# Patient Record
Sex: Male | Born: 1935
Health system: Southern US, Community
[De-identification: ages and names within clinical notes are randomized; demographics above are authoritative.]

## PROBLEM LIST (undated history)

## (undated) DIAGNOSIS — M1990 Unspecified osteoarthritis, unspecified site: Secondary | ICD-10-CM

## (undated) DIAGNOSIS — I10 Essential (primary) hypertension: Secondary | ICD-10-CM

## (undated) DIAGNOSIS — D689 Coagulation defect, unspecified: Secondary | ICD-10-CM

## (undated) DIAGNOSIS — Z8673 Personal history of transient ischemic attack (TIA), and cerebral infarction without residual deficits: Secondary | ICD-10-CM

## (undated) DIAGNOSIS — R569 Unspecified convulsions: Secondary | ICD-10-CM

## (undated) DIAGNOSIS — E46 Unspecified protein-calorie malnutrition: Secondary | ICD-10-CM

## (undated) DIAGNOSIS — G8929 Other chronic pain: Secondary | ICD-10-CM

## (undated) DIAGNOSIS — I480 Paroxysmal atrial fibrillation: Secondary | ICD-10-CM

## (undated) DIAGNOSIS — K7010 Alcoholic hepatitis without ascites: Secondary | ICD-10-CM

## (undated) DIAGNOSIS — I469 Cardiac arrest, cause unspecified: Secondary | ICD-10-CM

## (undated) DIAGNOSIS — F1010 Alcohol abuse, uncomplicated: Secondary | ICD-10-CM

## (undated) DIAGNOSIS — Z8701 Personal history of pneumonia (recurrent): Secondary | ICD-10-CM

## (undated) DIAGNOSIS — R443 Hallucinations, unspecified: Secondary | ICD-10-CM

## (undated) DIAGNOSIS — I4892 Unspecified atrial flutter: Secondary | ICD-10-CM

## (undated) DIAGNOSIS — I2510 Atherosclerotic heart disease of native coronary artery without angina pectoris: Secondary | ICD-10-CM

## (undated) DIAGNOSIS — E785 Hyperlipidemia, unspecified: Secondary | ICD-10-CM

## (undated) DIAGNOSIS — E119 Type 2 diabetes mellitus without complications: Secondary | ICD-10-CM

## (undated) DIAGNOSIS — R4182 Altered mental status, unspecified: Secondary | ICD-10-CM

## (undated) DIAGNOSIS — M545 Low back pain, unspecified: Secondary | ICD-10-CM

## (undated) DIAGNOSIS — D638 Anemia in other chronic diseases classified elsewhere: Secondary | ICD-10-CM

## (undated) HISTORY — PX: CORONARY ARTERY BYPASS GRAFT: SHX141

## (undated) HISTORY — DX: Low back pain: M54.5

## (undated) HISTORY — DX: Anemia in other chronic diseases classified elsewhere: D63.8

## (undated) HISTORY — DX: Alcoholic hepatitis without ascites: K70.10

## (undated) HISTORY — DX: Low back pain, unspecified: M54.50

## (undated) HISTORY — DX: Alcohol abuse, uncomplicated: F10.10

## (undated) HISTORY — DX: Hyperlipidemia, unspecified: E78.5

## (undated) HISTORY — DX: Other chronic pain: G89.29

## (undated) HISTORY — PX: CATARACT EXTRACTION W/ INTRAOCULAR LENS  IMPLANT, BILATERAL: SHX1307

---

## 1996-08-02 ENCOUNTER — Encounter: Payer: Self-pay | Admitting: Internal Medicine

## 1996-09-09 ENCOUNTER — Encounter: Payer: Self-pay | Admitting: Internal Medicine

## 1996-11-16 ENCOUNTER — Encounter: Payer: Self-pay | Admitting: Internal Medicine

## 1996-11-23 ENCOUNTER — Encounter: Payer: Self-pay | Admitting: Internal Medicine

## 1997-09-11 ENCOUNTER — Emergency Department (HOSPITAL_COMMUNITY): Admission: EM | Admit: 1997-09-11 | Discharge: 1997-09-11 | Payer: Self-pay | Admitting: Emergency Medicine

## 1999-03-08 ENCOUNTER — Encounter: Payer: Self-pay | Admitting: Specialist

## 1999-03-08 ENCOUNTER — Ambulatory Visit (HOSPITAL_COMMUNITY): Admission: RE | Admit: 1999-03-08 | Discharge: 1999-03-08 | Payer: Self-pay | Admitting: Specialist

## 1999-03-25 ENCOUNTER — Encounter: Payer: Self-pay | Admitting: Specialist

## 1999-03-25 ENCOUNTER — Ambulatory Visit (HOSPITAL_COMMUNITY): Admission: RE | Admit: 1999-03-25 | Discharge: 1999-03-25 | Payer: Self-pay | Admitting: Specialist

## 2000-02-26 ENCOUNTER — Emergency Department (HOSPITAL_COMMUNITY): Admission: EM | Admit: 2000-02-26 | Discharge: 2000-02-26 | Payer: Self-pay | Admitting: Emergency Medicine

## 2000-06-08 ENCOUNTER — Emergency Department (HOSPITAL_COMMUNITY): Admission: EM | Admit: 2000-06-08 | Discharge: 2000-06-09 | Payer: Self-pay | Admitting: Emergency Medicine

## 2000-08-11 ENCOUNTER — Inpatient Hospital Stay (HOSPITAL_COMMUNITY): Admission: EM | Admit: 2000-08-11 | Discharge: 2000-08-19 | Payer: Self-pay | Admitting: Emergency Medicine

## 2000-08-11 ENCOUNTER — Encounter: Payer: Self-pay | Admitting: Emergency Medicine

## 2000-08-14 ENCOUNTER — Encounter: Payer: Self-pay | Admitting: Orthopedic Surgery

## 2000-10-15 ENCOUNTER — Emergency Department (HOSPITAL_COMMUNITY): Admission: EM | Admit: 2000-10-15 | Discharge: 2000-10-15 | Payer: Self-pay | Admitting: Emergency Medicine

## 2001-04-13 ENCOUNTER — Encounter: Payer: Self-pay | Admitting: Emergency Medicine

## 2001-04-13 ENCOUNTER — Emergency Department (HOSPITAL_COMMUNITY): Admission: EM | Admit: 2001-04-13 | Discharge: 2001-04-13 | Payer: Self-pay | Admitting: Emergency Medicine

## 2001-06-05 ENCOUNTER — Inpatient Hospital Stay (HOSPITAL_COMMUNITY): Admission: EM | Admit: 2001-06-05 | Discharge: 2001-06-08 | Payer: Self-pay | Admitting: Emergency Medicine

## 2001-06-26 ENCOUNTER — Encounter: Payer: Self-pay | Admitting: Emergency Medicine

## 2001-06-26 ENCOUNTER — Emergency Department (HOSPITAL_COMMUNITY): Admission: EM | Admit: 2001-06-26 | Discharge: 2001-06-26 | Payer: Self-pay | Admitting: Emergency Medicine

## 2003-12-04 ENCOUNTER — Emergency Department (HOSPITAL_COMMUNITY): Admission: EM | Admit: 2003-12-04 | Discharge: 2003-12-05 | Payer: Self-pay | Admitting: Emergency Medicine

## 2004-03-20 ENCOUNTER — Ambulatory Visit: Payer: Self-pay | Admitting: Internal Medicine

## 2004-07-16 ENCOUNTER — Ambulatory Visit: Payer: Self-pay | Admitting: Internal Medicine

## 2004-12-24 ENCOUNTER — Ambulatory Visit: Payer: Self-pay | Admitting: Internal Medicine

## 2005-11-06 ENCOUNTER — Ambulatory Visit: Payer: Self-pay | Admitting: Internal Medicine

## 2006-04-23 ENCOUNTER — Ambulatory Visit: Payer: Self-pay | Admitting: Internal Medicine

## 2006-12-11 DIAGNOSIS — I10 Essential (primary) hypertension: Secondary | ICD-10-CM

## 2006-12-11 DIAGNOSIS — I2510 Atherosclerotic heart disease of native coronary artery without angina pectoris: Secondary | ICD-10-CM | POA: Insufficient documentation

## 2006-12-11 DIAGNOSIS — E1151 Type 2 diabetes mellitus with diabetic peripheral angiopathy without gangrene: Secondary | ICD-10-CM

## 2007-08-12 ENCOUNTER — Ambulatory Visit: Payer: Self-pay | Admitting: Internal Medicine

## 2007-08-12 DIAGNOSIS — I480 Paroxysmal atrial fibrillation: Secondary | ICD-10-CM

## 2007-08-16 ENCOUNTER — Ambulatory Visit: Payer: Self-pay | Admitting: Internal Medicine

## 2007-08-16 DIAGNOSIS — D638 Anemia in other chronic diseases classified elsewhere: Secondary | ICD-10-CM

## 2007-08-16 HISTORY — DX: Anemia in other chronic diseases classified elsewhere: D63.8

## 2007-08-16 LAB — CONVERTED CEMR LAB
Ferritin: 108.1 ng/mL (ref 22.0–322.0)
Folate: 12.7 ng/mL

## 2007-08-23 ENCOUNTER — Ambulatory Visit: Payer: Self-pay | Admitting: Internal Medicine

## 2007-08-23 LAB — CONVERTED CEMR LAB
OCCULT 1: NEGATIVE
OCCULT 3: POSITIVE

## 2007-09-01 ENCOUNTER — Encounter: Payer: Self-pay | Admitting: Internal Medicine

## 2007-09-01 ENCOUNTER — Ambulatory Visit: Payer: Self-pay

## 2007-09-14 ENCOUNTER — Ambulatory Visit: Payer: Self-pay | Admitting: Internal Medicine

## 2007-09-14 LAB — CONVERTED CEMR LAB
Basophils Absolute: 0 10*3/uL (ref 0.0–0.1)
Blood Glucose, Fingerstick: 111
Eosinophils Absolute: 0.1 10*3/uL (ref 0.0–0.7)
HCT: 31.5 % — ABNORMAL LOW (ref 39.0–52.0)
Hemoglobin: 10.4 g/dL — ABNORMAL LOW (ref 13.0–17.0)
INR: 4.9
Lymphocytes Relative: 33.3 % (ref 12.0–46.0)
Monocytes Absolute: 0.8 10*3/uL (ref 0.1–1.0)
Monocytes Relative: 11.1 % (ref 3.0–12.0)
Neutrophils Relative %: 53 % (ref 43.0–77.0)
Prothrombin Time: 27 s
RBC: 3.2 M/uL — ABNORMAL LOW (ref 4.22–5.81)

## 2007-10-05 ENCOUNTER — Ambulatory Visit: Payer: Self-pay | Admitting: Internal Medicine

## 2007-10-06 LAB — CONVERTED CEMR LAB: Prothrombin Time: 30.3 s

## 2007-11-13 ENCOUNTER — Ambulatory Visit: Payer: Self-pay | Admitting: Internal Medicine

## 2007-11-13 ENCOUNTER — Inpatient Hospital Stay (HOSPITAL_COMMUNITY): Admission: EM | Admit: 2007-11-13 | Discharge: 2007-11-19 | Payer: Self-pay | Admitting: Emergency Medicine

## 2007-11-13 ENCOUNTER — Ambulatory Visit: Payer: Self-pay | Admitting: Cardiology

## 2007-11-16 ENCOUNTER — Encounter: Payer: Self-pay | Admitting: Internal Medicine

## 2007-11-23 ENCOUNTER — Ambulatory Visit: Payer: Self-pay | Admitting: Internal Medicine

## 2007-11-23 ENCOUNTER — Ambulatory Visit: Payer: Self-pay | Admitting: Cardiology

## 2007-11-23 LAB — CONVERTED CEMR LAB
BUN: 25 mg/dL — ABNORMAL HIGH (ref 6–23)
Calcium: 8.6 mg/dL (ref 8.4–10.5)
Creatinine, Ser: 1.5 mg/dL (ref 0.4–1.5)
GFR calc Af Amer: 59 mL/min
GFR calc non Af Amer: 49 mL/min
Glucose, Bld: 71 mg/dL (ref 70–99)
Potassium: 5.2 meq/L — ABNORMAL HIGH (ref 3.5–5.1)

## 2007-11-25 ENCOUNTER — Ambulatory Visit: Payer: Self-pay | Admitting: Internal Medicine

## 2007-12-03 ENCOUNTER — Ambulatory Visit: Payer: Self-pay | Admitting: Cardiovascular Disease

## 2007-12-09 ENCOUNTER — Ambulatory Visit: Payer: Self-pay | Admitting: Internal Medicine

## 2007-12-09 ENCOUNTER — Telehealth: Payer: Self-pay | Admitting: Internal Medicine

## 2007-12-14 ENCOUNTER — Ambulatory Visit: Payer: Self-pay | Admitting: Internal Medicine

## 2007-12-23 ENCOUNTER — Ambulatory Visit: Payer: Self-pay | Admitting: Internal Medicine

## 2007-12-29 ENCOUNTER — Ambulatory Visit: Payer: Self-pay | Admitting: Internal Medicine

## 2008-01-02 ENCOUNTER — Emergency Department (HOSPITAL_COMMUNITY): Admission: EM | Admit: 2008-01-02 | Discharge: 2008-01-02 | Payer: Self-pay | Admitting: Emergency Medicine

## 2008-01-03 ENCOUNTER — Telehealth (INDEPENDENT_AMBULATORY_CARE_PROVIDER_SITE_OTHER): Payer: Self-pay

## 2008-01-13 ENCOUNTER — Ambulatory Visit: Payer: Self-pay | Admitting: Cardiology

## 2008-01-27 ENCOUNTER — Ambulatory Visit: Payer: Self-pay | Admitting: Cardiology

## 2008-03-14 ENCOUNTER — Ambulatory Visit: Payer: Self-pay | Admitting: Internal Medicine

## 2008-03-14 LAB — CONVERTED CEMR LAB: Hgb A1c MFr Bld: 5.3 % (ref 4.6–6.0)

## 2008-03-31 ENCOUNTER — Ambulatory Visit: Payer: Self-pay | Admitting: Internal Medicine

## 2008-05-04 ENCOUNTER — Telehealth: Payer: Self-pay | Admitting: Internal Medicine

## 2008-07-13 ENCOUNTER — Ambulatory Visit: Payer: Self-pay | Admitting: Internal Medicine

## 2008-07-14 ENCOUNTER — Telehealth: Payer: Self-pay | Admitting: Internal Medicine

## 2008-07-14 LAB — CONVERTED CEMR LAB: Hgb A1c MFr Bld: 8.9 % — ABNORMAL HIGH (ref 4.6–6.5)

## 2008-08-17 ENCOUNTER — Emergency Department (HOSPITAL_COMMUNITY): Admission: EM | Admit: 2008-08-17 | Discharge: 2008-08-17 | Payer: Self-pay | Admitting: Family Medicine

## 2008-08-20 ENCOUNTER — Emergency Department (HOSPITAL_COMMUNITY): Admission: EM | Admit: 2008-08-20 | Discharge: 2008-08-20 | Payer: Self-pay | Admitting: Emergency Medicine

## 2008-09-25 ENCOUNTER — Ambulatory Visit: Payer: Self-pay | Admitting: Internal Medicine

## 2008-10-16 ENCOUNTER — Telehealth: Payer: Self-pay | Admitting: Internal Medicine

## 2008-10-24 ENCOUNTER — Ambulatory Visit: Payer: Self-pay | Admitting: Internal Medicine

## 2008-11-07 ENCOUNTER — Ambulatory Visit: Payer: Self-pay | Admitting: Internal Medicine

## 2008-11-21 ENCOUNTER — Ambulatory Visit: Payer: Self-pay | Admitting: Internal Medicine

## 2008-11-21 LAB — CONVERTED CEMR LAB: INR: 1.4

## 2008-12-01 ENCOUNTER — Encounter: Payer: Self-pay | Admitting: Cardiology

## 2008-12-05 ENCOUNTER — Encounter (INDEPENDENT_AMBULATORY_CARE_PROVIDER_SITE_OTHER): Payer: Self-pay | Admitting: *Deleted

## 2008-12-19 ENCOUNTER — Ambulatory Visit: Payer: Self-pay | Admitting: Internal Medicine

## 2008-12-19 LAB — CONVERTED CEMR LAB: Prothrombin Time: 13.2 s

## 2009-01-24 ENCOUNTER — Encounter (INDEPENDENT_AMBULATORY_CARE_PROVIDER_SITE_OTHER): Payer: Self-pay | Admitting: *Deleted

## 2009-02-26 ENCOUNTER — Encounter (INDEPENDENT_AMBULATORY_CARE_PROVIDER_SITE_OTHER): Payer: Self-pay | Admitting: *Deleted

## 2009-06-18 ENCOUNTER — Encounter: Payer: Self-pay | Admitting: Internal Medicine

## 2009-06-29 ENCOUNTER — Encounter: Payer: Self-pay | Admitting: Internal Medicine

## 2009-09-11 ENCOUNTER — Ambulatory Visit: Payer: Self-pay | Admitting: Internal Medicine

## 2009-09-11 ENCOUNTER — Inpatient Hospital Stay (HOSPITAL_COMMUNITY): Admission: EM | Admit: 2009-09-11 | Discharge: 2009-09-20 | Payer: Self-pay | Admitting: Emergency Medicine

## 2009-09-12 ENCOUNTER — Encounter (INDEPENDENT_AMBULATORY_CARE_PROVIDER_SITE_OTHER): Payer: Self-pay | Admitting: Internal Medicine

## 2009-09-19 ENCOUNTER — Telehealth: Payer: Self-pay | Admitting: Internal Medicine

## 2009-09-23 ENCOUNTER — Inpatient Hospital Stay (HOSPITAL_COMMUNITY)
Admission: EM | Admit: 2009-09-23 | Discharge: 2009-09-28 | Payer: Self-pay | Source: Home / Self Care | Admitting: Emergency Medicine

## 2009-09-24 ENCOUNTER — Encounter: Payer: Self-pay | Admitting: Internal Medicine

## 2009-09-24 ENCOUNTER — Ambulatory Visit: Payer: Self-pay | Admitting: Psychiatry

## 2009-09-25 ENCOUNTER — Telehealth: Payer: Self-pay | Admitting: Internal Medicine

## 2009-09-29 ENCOUNTER — Telehealth: Payer: Self-pay | Admitting: Family Medicine

## 2009-10-02 ENCOUNTER — Telehealth: Payer: Self-pay | Admitting: Internal Medicine

## 2009-10-04 ENCOUNTER — Telehealth: Payer: Self-pay | Admitting: Internal Medicine

## 2009-10-05 ENCOUNTER — Telehealth: Payer: Self-pay | Admitting: Internal Medicine

## 2009-10-09 ENCOUNTER — Ambulatory Visit: Payer: Self-pay | Admitting: Internal Medicine

## 2009-10-09 LAB — CONVERTED CEMR LAB
AST: 20 units/L (ref 0–37)
Albumin: 3.9 g/dL (ref 3.5–5.2)
Alkaline Phosphatase: 104 units/L (ref 39–117)
Basophils Absolute: 0 10*3/uL (ref 0.0–0.1)
Bilirubin, Direct: 0.1 mg/dL (ref 0.0–0.3)
Blood Glucose, Fingerstick: 86
Calcium: 9.5 mg/dL (ref 8.4–10.5)
GFR calc non Af Amer: 61.37 mL/min (ref >60)
Glucose, Bld: 81 mg/dL (ref 70–99)
Hemoglobin: 10.9 g/dL — ABNORMAL LOW (ref 13.0–17.0)
Lymphocytes Relative: 41.9 % (ref 12.0–46.0)
Monocytes Relative: 10.6 % (ref 3.0–12.0)
Neutro Abs: 4.1 10*3/uL (ref 1.4–7.7)
RBC: 3.38 M/uL — ABNORMAL LOW (ref 4.22–5.81)
RDW: 16 % — ABNORMAL HIGH (ref 11.5–14.6)
Sodium: 145 meq/L (ref 135–145)

## 2009-10-10 ENCOUNTER — Telehealth: Payer: Self-pay | Admitting: Internal Medicine

## 2009-10-11 ENCOUNTER — Telehealth: Payer: Self-pay | Admitting: Internal Medicine

## 2009-10-18 ENCOUNTER — Telehealth: Payer: Self-pay | Admitting: Internal Medicine

## 2009-10-23 ENCOUNTER — Ambulatory Visit: Payer: Self-pay | Admitting: Internal Medicine

## 2010-03-04 ENCOUNTER — Telehealth: Payer: Self-pay | Admitting: Internal Medicine

## 2010-03-08 ENCOUNTER — Telehealth: Payer: Self-pay | Admitting: Internal Medicine

## 2010-04-08 ENCOUNTER — Encounter: Payer: Self-pay | Admitting: Internal Medicine

## 2010-04-10 ENCOUNTER — Inpatient Hospital Stay (HOSPITAL_COMMUNITY)
Admission: EM | Admit: 2010-04-10 | Discharge: 2010-04-18 | Payer: Self-pay | Source: Home / Self Care | Attending: Internal Medicine | Admitting: Internal Medicine

## 2010-05-16 ENCOUNTER — Encounter: Payer: Self-pay | Admitting: Internal Medicine

## 2010-05-19 LAB — CONVERTED CEMR LAB
ALT: 13 units/L (ref 0–40)
ALT: 22 units/L (ref 0–53)
AST: 24 units/L (ref 0–37)
Albumin: 3.6 g/dL (ref 3.5–5.2)
Albumin: 3.7 g/dL (ref 3.5–5.2)
Alkaline Phosphatase: 119 units/L — ABNORMAL HIGH (ref 39–117)
Alkaline Phosphatase: 78 units/L (ref 39–117)
BUN: 10 mg/dL (ref 6–23)
BUN: 25 mg/dL — ABNORMAL HIGH (ref 6–23)
Basophils Relative: 0 % (ref 0.0–1.0)
Bilirubin, Direct: 0.1 mg/dL (ref 0.0–0.3)
CO2: 29 meq/L (ref 19–32)
Calcium: 8.6 mg/dL (ref 8.4–10.5)
Chol/HDL Ratio, serum: 2.9
Cholesterol: 117 mg/dL (ref 0–200)
Creatinine, Ser: 1 mg/dL (ref 0.4–1.5)
Eosinophil percent: 2.4 % (ref 0.0–5.0)
Eosinophils Relative: 2.4 % (ref 0.0–5.0)
GFR calc Af Amer: 77 mL/min
GFR calc non Af Amer: 64 mL/min
Glomerular Filtration Rate, Af Am: 95 mL/min/{1.73_m2}
Hgb A1c MFr Bld: 6.5 % — ABNORMAL HIGH (ref 4.6–6.0)
LDL Cholesterol: 70 mg/dL (ref 0–99)
Lymphocytes Relative: 35.5 % (ref 12.0–46.0)
MCHC: 35.4 g/dL (ref 30.0–36.0)
MCV: 94.1 fL (ref 78.0–100.0)
MCV: 98.5 fL (ref 78.0–100.0)
Monocytes Absolute: 0.7 10*3/uL (ref 0.2–0.7)
Neutro Abs: 3.5 10*3/uL (ref 1.4–7.7)
Neutrophils Relative %: 47.6 % (ref 43.0–77.0)
PSA: 3.81 ng/mL (ref 0.10–4.00)
PSA: 3.87 ng/mL (ref 0.10–4.00)
Platelets: 172 10*3/uL (ref 150–400)
Potassium: 3.7 meq/L (ref 3.5–5.1)
Potassium: 4.1 meq/L (ref 3.5–5.1)
RBC: 3.17 M/uL — ABNORMAL LOW (ref 4.22–5.81)
Sodium: 140 meq/L (ref 135–145)
Sodium: 141 meq/L (ref 135–145)
Total Bilirubin: 0.6 mg/dL (ref 0.3–1.2)
Total Protein: 9.5 g/dL — ABNORMAL HIGH (ref 6.0–8.3)
Triglyceride fasting, serum: 38 mg/dL (ref 0–149)
VLDL: 8 mg/dL (ref 0–40)
WBC: 6.3 10*3/uL (ref 4.5–10.5)

## 2010-05-20 ENCOUNTER — Ambulatory Visit
Admission: RE | Admit: 2010-05-20 | Discharge: 2010-05-20 | Payer: Self-pay | Source: Home / Self Care | Attending: Internal Medicine | Admitting: Internal Medicine

## 2010-05-20 ENCOUNTER — Other Ambulatory Visit: Payer: Self-pay | Admitting: Internal Medicine

## 2010-05-20 ENCOUNTER — Encounter: Payer: Self-pay | Admitting: Internal Medicine

## 2010-05-20 DIAGNOSIS — K7010 Alcoholic hepatitis without ascites: Secondary | ICD-10-CM | POA: Insufficient documentation

## 2010-05-20 HISTORY — DX: Alcoholic hepatitis without ascites: K70.10

## 2010-05-20 LAB — CBC WITH DIFFERENTIAL/PLATELET
Basophils Absolute: 0 10*3/uL (ref 0.0–0.1)
Eosinophils Absolute: 0.1 10*3/uL (ref 0.0–0.7)
HCT: 30.1 % — ABNORMAL LOW (ref 39.0–52.0)
Lymphs Abs: 2.8 10*3/uL (ref 0.7–4.0)
MCV: 100 fl (ref 78.0–100.0)
Monocytes Absolute: 0.7 10*3/uL (ref 0.1–1.0)
Neutrophils Relative %: 42.8 % — ABNORMAL LOW (ref 43.0–77.0)
Platelets: 155 10*3/uL (ref 150.0–400.0)
RDW: 17.3 % — ABNORMAL HIGH (ref 11.5–14.6)

## 2010-05-20 LAB — HEPATIC FUNCTION PANEL
Bilirubin, Direct: 0.1 mg/dL (ref 0.0–0.3)
Total Bilirubin: 0.6 mg/dL (ref 0.3–1.2)

## 2010-05-20 LAB — CONVERTED CEMR LAB: Alcohol, Ethyl (B): <10 mg/dL (ref 0–10)

## 2010-05-21 ENCOUNTER — Encounter: Payer: Self-pay | Admitting: Internal Medicine

## 2010-05-23 NOTE — Progress Notes (Signed)
  Phone Note Other Incoming   Summary of Call: call from [facility] -(per nurse line) -- with FYI wanted to let Dr Amador Cunas know that pt is refusing all home care  he is home from hospital and is alcoholic/ may be drinking again  family stated he did not want any home care and would not allow [place] worker past the front door (or open the door)  he is scheduled to have psychology eval on monday- will likely refuse that as well  Initial call taken by: Judith Part MD,  September 29, 2009 4:04 PM  Follow-up for Phone Call        I noted their report and will send this phone note to his primary physician  Genevieve Norlander will continue to update him Follow-up by: Judith Part MD,  September 29, 2009 4:05 PM

## 2010-05-23 NOTE — Progress Notes (Signed)
Summary: missed appt - transportation problems  Phone Note Outgoing Call Message from:  Patient  Call placed by: Duard Brady LPN,  March 04, 2010 4:57 PM Call placed to: Patient Summary of Call: missed rov - r/s to later in week - transportation problems. KIK Initial call taken by: Duard Brady LPN,  March 04, 2010 4:57 PM  Follow-up for Phone Call        noted Follow-up by: Gordy Savers  MD,  March 04, 2010 5:22 PM

## 2010-05-23 NOTE — Progress Notes (Signed)
Summary: PT/INR order for [facility] to draw  Phone Note From Other Clinic   Caller: Elnita Maxwell Ward - [facility] Summary of Call: calling wanting to know what dose coumadin - and when next PT/INR  due.  [phone]  ext [phone] Initial call taken by: Duard Brady LPN,  October 10, 2009 1:38 PM  Follow-up for Phone Call        attempt to call - ans mach - left message r/t coumadin 5 mg once daily at this time , PT/INR was just drawn in office yesterday - will be repeated in 2 wks - will forward msg to Dr. Amador Cunas to ok order to have drawn in home, but it will be next week before I call back due to he is out of office until Monday. KIK Follow-up by: Duard Brady LPN,  October 10, 2009 1:42 PM  Additional Follow-up for Phone Call Additional follow up Details #1::        OK to draw at home Additional Follow-up by: Gordy Savers  MD,  October 14, 2009 8:13 PM

## 2010-05-23 NOTE — Progress Notes (Signed)
Summary: advise about coumadin / lab to be done?  Phone Note From Other Clinic   Caller: TERRI - [facility] Reason for Call: Medication Check Summary of Call: PT WAS DISCHARGED FROM HOSP. [facility] 8PM . ON NO COUMADIN AT THIS TIME - NEED TO RESTART? WAS TREATED FRO UTI WHILE THERE , LAST DAY OF CIPRO . ALSO WAS PLACED ON CLONIDINE 0.3 MG three times a day , PLEASE ADVISE ABOUT COUMADIN.  CELL# [phone] Initial call taken by: Duard Brady LPN,  October 02, 2009 9:45 AM  Follow-up for Phone Call        after reviewing discharge note - should still be on coumadin daily - will ask Dr. Amador Cunas to advise since per pt - no coumadin since Friday. Terri advised. KIK Follow-up by: Duard Brady LPN,  October 02, 2009 9:50 AM  Additional Follow-up for Phone Call Additional follow up Details #1::        okay to resume Coumadin at prior dose.  please schedule office visit this week Additional Follow-up by: Gordy Savers  MD,  October 02, 2009 12:58 PM    Additional Follow-up for Phone Call Additional follow up Details #2::    spoke with terri - [facility] - resume coumadin5 mg once daily , must make appt to see Dr. Amador Cunas next week. KIK Follow-up by: Duard Brady LPN,  October 02, 2009 1:43 PM

## 2010-05-23 NOTE — Miscellaneous (Signed)
Summary: PT, INR Results/[facility]  PT, INR Results/[facility]   Imported By: Maryln Gottron 09/28/2009 13:21:37  _____________________________________________________________________  External Attachment:    Type:   Image     Comment:   External Document

## 2010-05-23 NOTE — Progress Notes (Signed)
Summary: missed rov   Phone Note Outgoing Call   Call placed by: Duard Brady LPN,  March 08, 2010 11:45 AM Call placed to: Patient Summary of Call: missed rov - pt sleeping - per person ans phone at home - no transportation. left instructions to call ans r/s when he does have transportation. KIk Initial call taken by: Duard Brady LPN,  March 08, 2010 11:46 AM  Follow-up for Phone Call        charge NS fee Follow-up by: Gordy Savers  MD,  March 08, 2010 11:54 AM

## 2010-05-23 NOTE — Assessment & Plan Note (Signed)
Summary: POST HOSP F/U (UTI, DEHYDRATION) // RS   Vital Signs:  Patient profile:   76 year old male Weight:      181 pounds Temp:     98.2 degrees F oral BP sitting:   130 / 82  (left arm) Cuff size:   regular  Vitals Entered By: Duard Brady LPN (October 09, 2009 11:18 AM) CC: post hospital  CBG Result 8   CC:  post hospital .  History of Present Illness: is a 76 year old patient who is seen today following a hospital discharge.  He was admitted with acute alcoholic intoxication and underwent an  alcohol withdrawal syndrome.  since his discharge he states that he has been abstinent.  He has chronic atrial for ablation on chronic Coumadin [facility] was complicated by dehydration and acute renal failure.  He has type 2 diabetes dyslipidemia and hypertension.  Since his discharge he is quite well.  He denies any weakness or orthostatic symptoms.  His medical regimen was discussed at [facility] discharge summary and records reviewed.  Allergies (verified): No Known Drug Allergies  Past History:  Past Medical History: Coronary artery disease Hypertension Diabetes mellitus, type II Dyslipidemia atrial fibrillation April 2009 anemia chronic low back pain alcohol abuse, history of alcohol withdrawal syndrome  Past Surgical History: Reviewed history from 12/11/2006 and no changes required. Coronary artery bypass graft-1998  Family History: Reviewed history from 09/14/2007 and no changes required. FH- CHF  father died in his 32s unclear causes mother that it is less than 30-possible congestive heart failure  Three brothers all deceased, cause for meningitis, heart failure, and the suicide death  Three sisters, one  cerebral aneurysm one with a history of diabetes  Review of Systems       The patient complains of muscle weakness.  The patient denies anorexia, fever, weight loss, weight gain, vision loss, decreased hearing, hoarseness, chest pain, syncope,  dyspnea on exertion, peripheral edema, prolonged cough, headaches, hemoptysis, abdominal pain, melena, hematochezia, severe indigestion/heartburn, hematuria, incontinence, genital sores, suspicious skin lesions, transient blindness, difficulty walking, depression, unusual weight change, abnormal bleeding, enlarged lymph nodes, angioedema, breast masses, and testicular masses.    Physical Exam  General:  appears chronically ill, but in no acute distress, alert and oriented.  Blood pressure 120/72 Head:  Normocephalic and atraumatic without obvious abnormalities. No apparent alopecia or balding. Eyes:  No corneal or conjunctival inflammation noted. EOMI. Perrla. Funduscopic exam benign, without hemorrhages, exudates or papilledema. Vision grossly normal. Mouth:  Oral mucosa and oropharynx without lesions or exudates.  Teeth in good repair. Neck:  No deformities, masses, or tenderness noted. Lungs:  Normal respiratory effort, chest expands symmetrically. Lungs are clear to auscultation, no crackles or wheezes. O2 saturation 98% Heart:  controlled ventricular response Abdomen:  Bowel sounds positive,abdomen soft and non-tender without masses, organomegaly or hernias noted. Msk:  No deformity or scoliosis noted of thoracic or lumbar spine.   Extremities:  no edema Skin:  Intact without suspicious lesions or rashes Cervical Nodes:  No lymphadenopathy noted Axillary Nodes:  No palpable lymphadenopathy Psych:  Oriented X3.    Diabetes Management Exam:    Eye Exam:       Eye Exam done here today          Results: normal   Impression & Recommendations:  Problem # 1:  COUMADIN THERAPY (ICD-V58.61)  Orders: Protime (16109UE)  Problem # 2:  ANEMIA OF OTHER CHRONIC DISEASE (ICD-285.29)  His updated medication list for this problem  includes:    Folic Acid 1 Mg Tabs (Folic acid) ..... Qd  Problem # 3:  DIABETES MELLITUS, TYPE II (ICD-250.00)  His updated medication list for this problem  includes:    Glimepiride 2 Mg Tabs (Glimepiride) ..... Qd    Lisinopril 10 Mg Tabs (Lisinopril) ..... Qd  Orders: Capillary Blood Glucose/CBG (04540) Venipuncture (98119) TLB-BMP (Basic Metabolic Panel-BMET) (80048-METABOL) TLB-CBC Platelet - w/Differential (85025-CBCD) TLB-Hepatic/Liver Function Pnl (80076-HEPATIC)  Problem # 4:  ATRIAL FIBRILLATION (ICD-427.31)  His updated medication list for this problem includes:    Norvasc 10 Mg Tabs (Amlodipine besylate) ..... Qd    Lopressor 50 Mg Tabs (Metoprolol tartrate) .Marland Kitchen... 1/2 bid    Coumadin 5 Mg Tabs (Warfarin sodium) ..... Once daily as directed  Orders: Venipuncture (14782) TLB-BMP (Basic Metabolic Panel-BMET) (80048-METABOL) TLB-CBC Platelet - w/Differential (85025-CBCD) TLB-Hepatic/Liver Function Pnl (80076-HEPATIC)  Complete Medication List: 1)  Clonidine Hcl 0.3 Mg Tabs (Clonidine hcl) .... Tid 2)  Hydralazine Hcl 50 Mg Tabs (Hydralazine hcl) .... Tid 3)  Norvasc 10 Mg Tabs (Amlodipine besylate) .... Qd 4)  Folic Acid 1 Mg Tabs (Folic acid) .... Qd 5)  Glimepiride 2 Mg Tabs (Glimepiride) .... Qd 6)  Lisinopril 10 Mg Tabs (Lisinopril) .... Qd 7)  Lopressor 50 Mg Tabs (Metoprolol tartrate) .... 1/2 bid 8)  Protonix 40 Mg Tbec (Pantoprazole sodium) .... Qd 9)  Thiamine Hcl 100 Mg Tabs (Thiamine hcl) .... Qd 10)  Coumadin 5 Mg Tabs (Warfarin sodium) .... Once daily as directed  Patient Instructions: 1)  Please schedule a follow-up appointment in 1 month. 2)  Limit your Sodium (Salt) to less than 2 grams a day(slightly less than 1/2 a teaspoon) to prevent fluid retention, swelling, or worsening of symptoms.  Appended Document: Orders Update     Clinical Lists Changes  Observations: Added new observation of NEXT PT: 2 weeks (10/09/2009 12:14) Added new observation of COMMENTS2: Wynona Canes, CMA  October 09, 2009 12:15 PM  (10/09/2009 12:14) Added new observation of INR: 4.3  (10/09/2009 12:14)      Laboratory  Results   Blood Tests   Date/Time Recieved: October 09, 2009 12:15 PM  Date/Time Reported: October 09, 2009 12:15 PM    INR: 4.3   (Normal Range: 0.88-1.12   Therap INR: 2.0-3.5) Comments: Wynona Canes, CMA  October 09, 2009 12:15 PM       ANTICOAGULATION RECORD PREVIOUS REGIMEN & LAB RESULTS   Previous INR:  1.1 on  12/19/2008 Previous Coumadin Dose(mg):  1/2 daily on  10/05/2007 Previous Regimen:  same on  12/19/2008 Previous Coagulation Comments:  I told patient per Dr. Kirtland Bouchard to please take the correct dose because it was very important. Patient said he understood. I also wrote the dose out on a card for him. on  12/19/2008  NEW REGIMEN & LAB RESULTS Current INR: 4.3 Regimen: same  (no change)       Repeat testing in: 2 weeks MEDICATIONS CLONIDINE HCL 0.3 MG TABS (CLONIDINE HCL) tid HYDRALAZINE HCL 50 MG TABS (HYDRALAZINE HCL) tid NORVASC 10 MG TABS (AMLODIPINE BESYLATE) qd FOLIC ACID 1 MG TABS (FOLIC ACID) qd GLIMEPIRIDE 2 MG TABS (GLIMEPIRIDE) qd LISINOPRIL 10 MG TABS (LISINOPRIL) qd LOPRESSOR 50 MG TABS (METOPROLOL TARTRATE) 1/2 bid PROTONIX 40 MG TBEC (PANTOPRAZOLE SODIUM) qd THIAMINE HCL 100 MG TABS (THIAMINE HCL) qd COUMADIN 5 MG TABS (WARFARIN SODIUM) once daily as directed

## 2010-05-23 NOTE — Progress Notes (Signed)
Summary: PT/INR recheck  Phone Note From Other Clinic   Caller: [phone] cheryl - [facility] Summary of Call: When to recheck PT/INT?  Last done 6-3 and doctor sent back as received.  Did not say when to recheck.   Initial call taken by: Rudy Jew, RN,  September 25, 2009 10:58 AM  Follow-up for Phone Call        pt/ INR  tomarrow or  thursday Follow-up by: Gordy Savers  MD,  September 25, 2009 12:59 PM  Additional Follow-up for Phone Call Additional follow up Details #1::        Phone call completed Additional Follow-up by: Rudy Jew, RN,  September 25, 2009 1:27 PM

## 2010-05-23 NOTE — Assessment & Plan Note (Signed)
Summary: PT/NJR   Nurse Visit   Allergies: No Known Drug Allergies Laboratory Results   Blood Tests   Date/Time Received: October 23, 2009 10:33 AM  Date/Time Reported: October 23, 2009 10:33 AM    INR: 2.7   (Normal Range: 0.88-1.12   Therap INR: 2.0-3.5) Comments: Wynona Canes, CMA  October 23, 2009 10:33 AM     Orders Added: 1)  Est. Patient Level I [99211] 2)  Protime [04540JW]  Laboratory Results   Blood Tests      INR: 2.7   (Normal Range: 0.88-1.12   Therap INR: 2.0-3.5) Comments: Wynona Canes, CMA  October 23, 2009 10:33 AM       ANTICOAGULATION RECORD PREVIOUS REGIMEN & LAB RESULTS   Previous INR:  4.3 on  10/09/2009 Previous Coumadin Dose(mg):  1/2 daily on  10/05/2007 Previous Regimen:  same on  12/19/2008 Previous Coagulation Comments:  I told patient per Dr. Kirtland Bouchard to please take the correct dose because it was very important. Patient said he understood. I also wrote the dose out on a card for him. on  12/19/2008  NEW REGIMEN & LAB RESULTS Anticoag. Dx: V58.83,V58.61,427.31 Current INR Goal Range: 2.0-3.0 Current INR: 2.7 Current Coumadin Dose(mg): 2.5mg  qd Regimen: same  (no change)       Repeat testing in: 4 weeks MEDICATIONS CLONIDINE HCL 0.3 MG TABS (CLONIDINE HCL) tid HYDRALAZINE HCL 50 MG TABS (HYDRALAZINE HCL) tid NORVASC 10 MG TABS (AMLODIPINE BESYLATE) qd FOLIC ACID 1 MG TABS (FOLIC ACID) qd GLIMEPIRIDE 2 MG TABS (GLIMEPIRIDE) qd LISINOPRIL 10 MG TABS (LISINOPRIL) qd LOPRESSOR 50 MG TABS (METOPROLOL TARTRATE) 1/2 bid PROTONIX 40 MG TBEC (PANTOPRAZOLE SODIUM) qd THIAMINE HCL 100 MG TABS (THIAMINE HCL) qd COUMADIN 5 MG TABS (WARFARIN SODIUM) once daily as directed   Anticoagulation Visit Questionnaire      Coumadin dose missed/changed:  No      Abnormal Bleeding Symptoms:  No   Any diet changes including alcohol intake, vegetables or greens since the last visit:  No Any illnesses or hospitalizations since the last visit:  No Any  signs of clotting since the last visit (including chest discomfort, dizziness, shortness of breath, arm tingling, slurred speech, swelling or redness in leg):  No

## 2010-05-23 NOTE — Progress Notes (Signed)
Summary: PT - [facility]  Phone Note From Other Clinic   Caller: Youlanda Mighty Summary of Call: need verbal order to continue seeing Johnny [name] - has been seeing twice wkly for balance and gait training.  [phone] Initial call taken by: Duard Brady LPN,  October 11, 2009 11:26 AM  Follow-up for Phone Call        call placed to Shawn - ans mach - LMTCB if questions - Dr. Amador Cunas out of office until Monday - will call past then if order given . KIK Follow-up by: Duard Brady LPN,  October 11, 2009 1:40 PM

## 2010-05-23 NOTE — Progress Notes (Signed)
Summary: NO call No show - post hospital  Phone Note Outgoing Call   Call placed by: Duard Brady LPN,  October 04, 2009 2:45 PM Call placed to: Patient Summary of Call: ans mach at hm# - [facility] and r/s post hospital f/u . KIK Initial call taken by: Duard Brady LPN,  October 04, 2009 2:45 PM

## 2010-05-23 NOTE — Progress Notes (Signed)
Summary: order for PT/INR  Phone Note From Other Clinic   Caller: Elnita Maxwell with [facility] Summary of Call: Would like to know if Dr. Amador Cunas would like PT?INR to be drawn in the home since they are already out there and he has a hard time getting to office.  [phone]  ext  [phone] Initial call taken by: Duard Brady LPN,  October 18, 2009 11:42 AM  Follow-up for Phone Call        OK Follow-up by: Gordy Savers  MD,  October 18, 2009 12:59 PM  Additional Follow-up for Phone Call Additional follow up Details #1::        called Elnita Maxwell at [facility] - gave verbal order to draw PT/INR in home. KIK Additional Follow-up by: Duard Brady LPN,  October 18, 2009 1:15 PM

## 2010-05-23 NOTE — Medication Information (Signed)
Summary: Order for Diabetic Testing Supplies  Order for Diabetic Testing Supplies   Imported By: Maryln Gottron 04/17/2010 15:05:40  _____________________________________________________________________  External Attachment:    Type:   Image     Comment:   External Document

## 2010-05-23 NOTE — Progress Notes (Signed)
Summary: out of meds - all ordered  Phone Note Call from Patient   Caller: Patient Call For: Gordy Savers  MD Summary of Call: took call from pt  - "out of sugar medicine" - but can't tell me the name. States terri from the hospital said to tell us. I told him I would call terri (from [facility]) and see what he needed. KIK Initial call taken by: Duard Brady LPN,  October 05, 2009 4:26 PM  Follow-up for Phone Call        called terri - amaryl is the med he is out of . She also indicated he does not have most of the medications he was dischraged from hospital with .   I will discuss with Dr. Amador Cunas what meds he would need at this time and call pt and terri back. KIK  our med list and discharge med list very different.  Follow-up by: Duard Brady LPN,  October 05, 2009 4:29 PM  Additional Follow-up for Phone Call Additional follow up Details #1::        okay  to call in medications from  Discharge medication list; please schedule ROV  next week Additional Follow-up by: Gordy Savers  MD,  October 05, 2009 4:40 PM    Additional Follow-up for Phone Call Additional follow up Details #2::    has appt tues.  Follow-up by: Duard Brady LPN,  October 05, 2009 4:30 PM  Additional Follow-up for Phone Call Additional follow up Details #3:: Details for Additional Follow-up Action Taken: spoke with nephew - per Dr. Amador Cunas - [facility] - he lives with him and I stressed importaacne of medications. He is also aware that he has appt. on tues.   I called terri with [facility] - left msg r/t meds. KIK  cleared old med list - put in discharge meds. and escribed.  called pharm to let them know he will be picking up all meds. KIK Additional Follow-up by: Duard Brady LPN,  October 05, 2009 5:14 PM  New/Updated Medications: CLONIDINE HCL 0.3 MG TABS (CLONIDINE HCL) tid HYDRALAZINE HCL 50 MG TABS (HYDRALAZINE HCL) tid NORVASC 10 MG TABS  (AMLODIPINE BESYLATE) qd FOLIC ACID 1 MG TABS (FOLIC ACID) qd GLIMEPIRIDE 2 MG TABS (GLIMEPIRIDE) qd LISINOPRIL 10 MG TABS (LISINOPRIL) qd LOPRESSOR 50 MG TABS (METOPROLOL TARTRATE) 1/2 bid PROTONIX 40 MG TBEC (PANTOPRAZOLE SODIUM) qd THIAMINE HCL 100 MG TABS (THIAMINE HCL) qd COUMADIN 5 MG TABS (WARFARIN SODIUM) once daily as directed Prescriptions: COUMADIN 5 MG TABS (WARFARIN SODIUM) once daily as directed  #30 x 0   Entered by:   Duard Brady LPN   Authorized by:   Gordy Savers  MD   Signed by:   Duard Brady LPN on 16/01/9603   Method used:   [facility] to        [facility] [phone][facility] (retail)       [address]       [place], [place]  [address]       Ph: [phone] or [phone]       Fax: [phone]   RxID:   [id number] THIAMINE HCL 100 MG TABS (THIAMINE HCL) qd  #30 x 0   Entered by:   Duard Brady LPN   Authorized by:   Gordy Savers  MD   Signed by:   Duard Brady LPN on 95/63/8756   Method used:  Electronically to        [facility] [phone]* (retail)       [address]       [place], [place]  [address]       Ph: [phone] or [phone]       Fax: [phone]   RxID:   [id number] PROTONIX 40 MG TBEC (PANTOPRAZOLE SODIUM) qd  #30 x 0   Entered by:   Duard Brady LPN   Authorized by:   Gordy Savers  MD   Signed by:   Duard Brady LPN on 82/99/3716   Method used:   Electronically to        [facility] [phone]* (retail)       [address]       [place], [place]  [address]       Ph: [phone] or [phone]       Fax: [phone]   RxID:   [id number] LOPRESSOR 50 MG TABS (METOPROLOL TARTRATE) 1/2 bid  #30 x 0   Entered by:   Duard Brady LPN   Authorized by:   Gordy Savers  MD   Signed by:   Duard Brady LPN on 83/38/2505   Method used:    Electronically to        [facility] [phone]* (retail)       [address]       [place], [place]  [address]       Ph: [phone] or [phone]       Fax: [phone]   RxID:   [id number] LISINOPRIL 10 MG TABS (LISINOPRIL) qd  #30 x 0   Entered by:   Duard Brady LPN   Authorized by:   Gordy Savers  MD   Signed by:   Duard Brady LPN on 63/14/9702   Method used:   Electronically to        [facility] [phone]* (retail)       [address]       [place], [place]  [address]       Ph: [phone] or [phone]       Fax: [phone]   RxID:   [id number] GLIMEPIRIDE 2 MG TABS (GLIMEPIRIDE) qd  #30 x 0   Entered by:   Duard Brady LPN   Authorized by:   Gordy Savers  MD   Signed by:   Duard Brady LPN on 01/74/9449   Method used:   Electronically to        [facility] [address] [phone]* (retail)       [address]       [place], [place]  [address]       Ph: [phone] or [phone]       Fax: [phone]   RxID:   [id number] FOLIC ACID 1 MG TABS (FOLIC ACID) qd  #30 x 0   Entered by:   Duard Brady LPN   Authorized by:   Gordy Savers  MD   Signed by:   Duard Brady LPN on 68/02/5725   Method used:   Electronically to        [facility] [phone]* (retail)  [address]       [place], [place]  [address]       Ph: [phone] or [phone]       Fax: [phone]   RxID:   [id number] NORVASC 10 MG TABS (AMLODIPINE BESYLATE) qd  #30 x 0   Entered by:   Duard Brady LPN   Authorized by:   Gordy Savers  MD   Signed by:   Duard Brady LPN on 36/64/4034   Method used:   Electronically to        [facility] [address] [phone]* (retail)       [address]       [place], [place]  [address]       Ph:  [phone] or [phone]       Fax: [phone]   RxID:   [id number] HYDRALAZINE HCL 50 MG TABS (HYDRALAZINE HCL) tid  #90 x 0   Entered by:   Duard Brady LPN   Authorized by:   Gordy Savers  MD   Signed by:   Duard Brady LPN on 15/17/6160   Method used:   Electronically to        [facility] [phone]* (retail)       [address]       [place], [place]  [address]       Ph: [phone] or [phone]       Fax: [phone]   RxID:   [id number] CLONIDINE HCL 0.3 MG TABS (CLONIDINE HCL) tid  #30 x 0   Entered by:   Duard Brady LPN   Authorized by:   Gordy Savers  MD   Signed by:   Duard Brady LPN on 53/61/4431   Method used:   Electronically to        [facility] [phone]* (retail)       [address]       [place], [place]  [address]       Ph: [phone] or [phone]       Fax: [phone]   RxID:   [id number]

## 2010-05-23 NOTE — Progress Notes (Signed)
Summary: PT/INR friday   Phone Note Other Incoming   Caller: Corrie Dandy - Futures trader at [facility]  Summary of Call: pt being discharge today and will be on coumadin - PT/INR will be drawn on friday by homehealth and they will call to Dr. Amador Cunas.   Initial call taken by: Duard Brady LPN,  September 20, 863 11:52 AM  Follow-up for Phone Call        ok Follow-up by: Gordy Savers  MD,  September 20, 2009 7:59 AM

## 2010-05-23 NOTE — Medication Information (Signed)
Summary: Clarification of Order for Diabetic Supplies  Clarification of Order for Diabetic Supplies   Imported By: Maryln Gottron 07/04/2009 10:49:35  _____________________________________________________________________  External Attachment:    Type:   Image     Comment:   External Document

## 2010-05-24 NOTE — Letter (Signed)
Summary: Certificate of Medical Necessity for Diabetic Supplies  Certificate of Medical Necessity for Diabetic Supplies   Imported By: Maryln Gottron 06/19/2009 13:33:42  _____________________________________________________________________  External Attachment:    Type:   Image     Comment:   External Document

## 2010-05-29 NOTE — Assessment & Plan Note (Signed)
Summary: POST HOS [facility]UP/[facility]   Vital Signs:  Patient profile:   76 year old male Weight:      185 pounds Temp:     98.4 degrees F oral BP sitting:   112 / 72  (right arm) Cuff size:   regular  Vitals Entered By: Duard Brady LPN (May 20, 2010 12:46 PM) CC: post hospital     fbs 201 Is Patient Diabetic? Yes Did you bring your meter with you today? No   CC:  post hospital     fbs 201.  History of Present Illness: 49 -year-old patient who is seen today following a hospital discharge, approximately 30 days ago.  He was admitted to the hospital for acute alcoholic detoxification.  He has a history of diabetes, hypertension, as well as coronary artery disease.  He remains on Coumadin anticoagulation for atrial fibrillation.  He was hospitalized earlier last year also for alcoholic detoxification.  He states that he is no longer drinking.  alcohol  rehab and AA discussed at length.  He lives with two nephews who apparently drink daily. He was discharged on Lantus and states his blood sugars are generally in the 150 range.  He is also on Amaryl.  Hospital admission was complicated by alcoholic hepatitis and DTs.  Hospital records reviewed  Allergies (verified): No Known Drug Allergies  Past History:  Past Medical History: Reviewed history from 10/09/2009 and no changes required. Coronary artery disease Hypertension Diabetes mellitus, type II Dyslipidemia atrial fibrillation April 2009 anemia chronic low back pain alcohol abuse, history of alcohol withdrawal syndrome  Past Surgical History: Reviewed history from 12/11/2006 and no changes required. Coronary artery bypass graft-1998  Family History: Reviewed history from 09/14/2007 and no changes required. FH- CHF  father died in his 20s unclear causes mother that it is less than 30-possible congestive heart failure  Three brothers all deceased, cause for meningitis, heart failure, and the suicide death  Three sisters,  one  cerebral aneurysm one with a history of diabetes  Social History: Reviewed history from 03/14/2008 and no changes required. Former Smoker history of EtOH  Review of Systems       The patient complains of anorexia and muscle weakness.  The patient denies fever, weight loss, weight gain, vision loss, decreased hearing, hoarseness, chest pain, syncope, dyspnea on exertion, peripheral edema, prolonged cough, headaches, hemoptysis, abdominal pain, melena, hematochezia, severe indigestion/heartburn, hematuria, incontinence, genital sores, suspicious skin lesions, transient blindness, difficulty walking, depression, unusual weight change, abnormal bleeding, enlarged lymph nodes, angioedema, breast masses, and testicular masses.    Physical Exam  General:  appears chronically ill, but in no acute distress.  Blood pressure 140/60 left arm Head:  Normocephalic and atraumatic without obvious abnormalities. No apparent alopecia or balding. Eyes:  No corneal or conjunctival inflammation noted. EOMI. Perrla. Funduscopic exam benign, without hemorrhages, exudates or papilledema. Vision grossly normal. Mouth:  Oral mucosa and oropharynx without lesions or exudates.  Teeth in good repair. Neck:  No deformities, masses, or tenderness noted. Lungs:  Normal respiratory effort, chest expands symmetrically. Lungs are clear to auscultation, no crackles or wheezes. Heart:  Normal rate and regular rhythm. S1 and S2 normal without gallop, murmur, click, rub or other extra sounds. Abdomen:  Bowel sounds positive,abdomen soft and non-tender without masses, organomegaly or hernias noted. Msk:  No deformity or scoliosis noted of thoracic or lumbar spine.   Pulses:  R and L carotid,radial,femoral,dorsalis pedis and posterior tibial pulses are full and equal bilaterally Extremities:  No clubbing, cyanosis, edema, or deformity noted with normal full range of motion of all joints.   Neurologic:  numbness distal fingers  of both hands   Impression & Recommendations:  Problem # 1:  COUMADIN THERAPY (ICD-V58.61)  Problem # 2:  ATRIAL FIBRILLATION (ICD-427.31)  His updated medication list for this problem includes:    Norvasc 10 Mg Tabs (Amlodipine besylate) ..... Qd    Lopressor 50 Mg Tabs (Metoprolol tartrate) ..... One and 1/2 tablets  two times a day    Coumadin 5 Mg Tabs (Warfarin sodium) ..... Once daily as directed    Aspir-low 81 Mg Tbec (Aspirin) ..... One daily  His updated medication list for this problem includes:    Norvasc 10 Mg Tabs (Amlodipine besylate) ..... Qd    Lopressor 50 Mg Tabs (Metoprolol tartrate) ..... One and 1/2 tablets  two times a day    Coumadin 5 Mg Tabs (Warfarin sodium) ..... Once daily as directed    Aspir-low 81 Mg Tbec (Aspirin) ..... One daily  Problem # 3:  DIABETES MELLITUS, TYPE II (ICD-250.00)  The following medications were removed from the medication list:    Glimepiride 2 Mg Tabs (Glimepiride) ..... Qd    Lisinopril 10 Mg Tabs (Lisinopril) ..... Qd His updated medication list for this problem includes:    Aspir-low 81 Mg Tbec (Aspirin) ..... One daily    Lantus Solostar 100 Unit/ml Soln (Insulin glargine) .Marland KitchenMarland KitchenMarland KitchenMarland Kitchen 15 units at bedtime    The following medications were removed from the medication list:    Glimepiride 2 Mg Tabs (Glimepiride) ..... Qd    Lisinopril 10 Mg Tabs (Lisinopril) ..... Qd His updated medication list for this problem includes:    Aspir-low 81 Mg Tbec (Aspirin) ..... One daily    Lantus Solostar 100 Unit/ml Soln (Insulin glargine) .Marland KitchenMarland KitchenMarland KitchenMarland Kitchen 15 units at bedtime  Problem # 4:  CORONARY ARTERY DISEASE (ICD-414.00)  The following medications were removed from the medication list:    Hydralazine Hcl 50 Mg Tabs (Hydralazine hcl) .Marland Kitchen... Tid    Lisinopril 10 Mg Tabs (Lisinopril) ..... Qd His updated medication list for this problem includes:    Clonidine Hcl 0.3 Mg Tabs (Clonidine hcl) .Marland Kitchen... Tid    Norvasc 10 Mg Tabs (Amlodipine besylate)  ..... Qd    Lopressor 50 Mg Tabs (Metoprolol tartrate) ..... One and 1/2 tablets  two times a day    Aspir-low 81 Mg Tbec (Aspirin) ..... One daily    The following medications were removed from the medication list:    Hydralazine Hcl 50 Mg Tabs (Hydralazine hcl) .Marland Kitchen... Tid    Lisinopril 10 Mg Tabs (Lisinopril) ..... Qd His updated medication list for this problem includes:    Clonidine Hcl 0.3 Mg Tabs (Clonidine hcl) .Marland Kitchen... Tid    Norvasc 10 Mg Tabs (Amlodipine besylate) ..... Qd    Lopressor 50 Mg Tabs (Metoprolol tartrate) ..... One and 1/2 tablets  two times a day    Aspir-low 81 Mg Tbec (Aspirin) ..... One daily  Complete Medication List: 1)  Clonidine Hcl 0.3 Mg Tabs (Clonidine hcl) .... Tid 2)  Norvasc 10 Mg Tabs (Amlodipine besylate) .... Qd 3)  Folic Acid 1 Mg Tabs (Folic acid) .... Qd 4)  Lopressor 50 Mg Tabs (Metoprolol tartrate) .... One and 1/2 tablets  two times a day 5)  Protonix 40 Mg Tbec (Pantoprazole sodium) .... Qd 6)  Thiamine Hcl 100 Mg Tabs (Thiamine hcl) .... Qd 7)  Coumadin 5 Mg Tabs (Warfarin sodium) .... Once daily  as directed 8)  Aspir-low 81 Mg Tbec (Aspirin) .... One daily 9)  Lantus Solostar 100 Unit/ml Soln (Insulin glargine) .Marland Kitchen.. 15 units at bedtime  Other Orders: Venipuncture (16109) TLB-CBC Platelet - w/Differential (85025-CBCD) TLB-Hepatic/Liver Function Pnl (80076-HEPATIC) Protime (60454UJ) T- * Misc. Laboratory test [phone])  Patient Instructions: 1)  Please schedule a follow-up appointment in 1 month. 2)  Limit your Sodium (Salt). 3)  It is important that you exercise regularly at least 20 minutes 5 times a week. If you develop chest pain, have severe difficulty breathing, or feel very tired , stop exercising immediately and seek medical attention.   Orders Added: 1)  Est. Patient Level IV [47829] 2)  Venipuncture [56213] 3)  TLB-CBC Platelet - w/Differential [85025-CBCD] 4)  TLB-Hepatic/Liver Function Pnl [80076-HEPATIC] 5)  Protime  [[id number]] 6)  T- * Misc. Laboratory test [phone]  Appended Document: Orders Update     Clinical Lists Changes  Orders: Added new Service order of Specimen Handling (84696) - Signed

## 2010-05-29 NOTE — Medication Information (Signed)
Summary: Order for Diabetic Supplies  Order for Diabetic Supplies   Imported By: Maryln Gottron 05/24/2010 13:03:29  _____________________________________________________________________  External Attachment:    Type:   Image     Comment:   External Document

## 2010-05-29 NOTE — Miscellaneous (Signed)
Summary: Certification and Plan of Care/[facility]  Certification and Plan of Care/[facility]   Imported By: Maryln Gottron 05/23/2010 10:07:31  _____________________________________________________________________  External Attachment:    Type:   Image     Comment:   External Document

## 2010-06-16 ENCOUNTER — Encounter: Payer: Self-pay | Admitting: Internal Medicine

## 2010-06-17 ENCOUNTER — Encounter: Payer: Self-pay | Admitting: Internal Medicine

## 2010-06-17 ENCOUNTER — Ambulatory Visit (INDEPENDENT_AMBULATORY_CARE_PROVIDER_SITE_OTHER): Payer: Medicare Other | Admitting: Internal Medicine

## 2010-06-17 DIAGNOSIS — I4892 Unspecified atrial flutter: Secondary | ICD-10-CM

## 2010-06-17 DIAGNOSIS — R279 Unspecified lack of coordination: Secondary | ICD-10-CM

## 2010-06-17 DIAGNOSIS — I10 Essential (primary) hypertension: Secondary | ICD-10-CM

## 2010-06-17 DIAGNOSIS — E119 Type 2 diabetes mellitus without complications: Secondary | ICD-10-CM

## 2010-06-17 DIAGNOSIS — I4891 Unspecified atrial fibrillation: Secondary | ICD-10-CM

## 2010-06-17 LAB — HEMOGLOBIN A1C: Hgb A1c MFr Bld: 6.8 % — ABNORMAL HIGH (ref 4.6–6.5)

## 2010-06-17 NOTE — Progress Notes (Signed)
  Subjective:    Patient ID: Johnny [name], male    DOB: 21-Oct-1934, 76 y.o.   MRN: [id number]  HPI   76 year old patient who is seen today for followup of his type 2 diabetes. He does monitor blood sugars at home he states they're usually fairly normal between 80 and 120. Only rarely are they over 200. He is on Lantus 15 units at bedtime only.  he was hospitalized 2 months ago for alcoholism with acute DTs. He states that he has remained off alcohol. Coumadin anticoagulation therapy was discontinued at that time he has been in normal sinus rhythm and has been on aspirin therapy only.  He has treated hypertension as well as dyslipidemia he has a history of coronary artery disease in general he has been stable.    Review of Systems  Constitutional: Negative for fever, chills, appetite change and fatigue.  HENT: Negative for hearing loss, ear pain, congestion, sore throat, trouble swallowing, neck stiffness, dental problem, voice change and tinnitus.   Eyes: Negative for pain, discharge and visual disturbance.  Respiratory: Negative for cough, chest tightness, wheezing and stridor.   Cardiovascular: Negative for chest pain, palpitations and leg swelling.  Gastrointestinal: Negative for nausea, vomiting, abdominal pain, diarrhea, constipation, blood in stool and abdominal distention.  Genitourinary: Negative for urgency, hematuria, flank pain, discharge, difficulty urinating and genital sores.  Musculoskeletal: Negative for myalgias, back pain, joint swelling, arthralgias and gait problem.  Skin: Negative for rash.  Neurological: Negative for dizziness, syncope, speech difficulty, weakness, numbness and headaches.  Hematological: Negative for adenopathy. Does not bruise/bleed easily.  Psychiatric/Behavioral: Negative for behavioral problems and dysphoric mood. The patient is not nervous/anxious.        Objective:   Physical Exam  Constitutional: He is oriented to person, place, and time. He  appears well-developed.  HENT:  Head: Normocephalic.  Right Ear: External ear normal.  Left Ear: External ear normal.  Eyes: Conjunctivae and EOM are normal.  Neck: Normal range of motion.  Cardiovascular: Normal rate and normal heart sounds.   Pulmonary/Chest: Breath sounds normal.  Abdominal: Bowel sounds are normal.  Musculoskeletal: Normal range of motion. He exhibits no edema and no tenderness.  Neurological: He is alert and oriented to person, place, and time.  Psychiatric: He has a normal mood and affect. His behavior is normal.          Assessment & Plan:   diabetes mellitus stable. We'll check a hemoglobin A1c today  Hypertension stable we will continue his present regimen written instructions dispensed  Dyslipidemia  Paroxysmal atrial  Fibrillation-  Presently  normal sinus rhythm  Alcoholism

## 2010-06-17 NOTE — Patient Instructions (Signed)
Limit your sodium (Salt) intake   Please check your hemoglobin A1c every 3 months  Please check your blood pressure on a regular basis.  If it is consistently greater than 150/90, please make an office appointment.  .57m

## 2010-07-01 LAB — DIFFERENTIAL
Basophils Absolute: 0 10*3/uL (ref 0.0–0.1)
Lymphocytes Relative: 27 % (ref 12–46)
Monocytes Absolute: 1.4 10*3/uL — ABNORMAL HIGH (ref 0.1–1.0)
Neutro Abs: 3.6 10*3/uL (ref 1.7–7.7)

## 2010-07-01 LAB — URINE CULTURE: Culture  Setup Time: 201112222258

## 2010-07-01 LAB — GLUCOSE, CAPILLARY
Glucose-Capillary: 100 mg/dL — ABNORMAL HIGH (ref 70–99)
Glucose-Capillary: 106 mg/dL — ABNORMAL HIGH (ref 70–99)
Glucose-Capillary: 116 mg/dL — ABNORMAL HIGH (ref 70–99)
Glucose-Capillary: 120 mg/dL — ABNORMAL HIGH (ref 70–99)
Glucose-Capillary: 127 mg/dL — ABNORMAL HIGH (ref 70–99)
Glucose-Capillary: 145 mg/dL — ABNORMAL HIGH (ref 70–99)
Glucose-Capillary: 145 mg/dL — ABNORMAL HIGH (ref 70–99)
Glucose-Capillary: 155 mg/dL — ABNORMAL HIGH (ref 70–99)
Glucose-Capillary: 158 mg/dL — ABNORMAL HIGH (ref 70–99)
Glucose-Capillary: 166 mg/dL — ABNORMAL HIGH (ref 70–99)
Glucose-Capillary: 166 mg/dL — ABNORMAL HIGH (ref 70–99)
Glucose-Capillary: 169 mg/dL — ABNORMAL HIGH (ref 70–99)
Glucose-Capillary: 170 mg/dL — ABNORMAL HIGH (ref 70–99)
Glucose-Capillary: 170 mg/dL — ABNORMAL HIGH (ref 70–99)
Glucose-Capillary: 177 mg/dL — ABNORMAL HIGH (ref 70–99)
Glucose-Capillary: 187 mg/dL — ABNORMAL HIGH (ref 70–99)
Glucose-Capillary: 218 mg/dL — ABNORMAL HIGH (ref 70–99)
Glucose-Capillary: 220 mg/dL — ABNORMAL HIGH (ref 70–99)
Glucose-Capillary: 253 mg/dL — ABNORMAL HIGH (ref 70–99)
Glucose-Capillary: 82 mg/dL (ref 70–99)

## 2010-07-01 LAB — BASIC METABOLIC PANEL
BUN: 23 mg/dL (ref 6–23)
CO2: 24 mEq/L (ref 19–32)
Calcium: 8.8 mg/dL (ref 8.4–10.5)
Chloride: 98 mEq/L (ref 96–112)
Chloride: 99 mEq/L (ref 96–112)
Creatinine, Ser: 1.07 mg/dL (ref 0.4–1.5)
Creatinine, Ser: 1.21 mg/dL (ref 0.4–1.5)
GFR calc Af Amer: >60 mL/min (ref >60)
GFR calc Af Amer: >60 mL/min (ref >60)
GFR calc non Af Amer: 59 mL/min — ABNORMAL LOW (ref >60)
GFR calc non Af Amer: >60 mL/min (ref >60)
Glucose, Bld: 156 mg/dL — ABNORMAL HIGH (ref 70–99)
Glucose, Bld: 160 mg/dL — ABNORMAL HIGH (ref 70–99)
Potassium: 3.9 mEq/L (ref 3.5–5.1)
Potassium: 5.7 mEq/L — ABNORMAL HIGH (ref 3.5–5.1)
Sodium: 130 mEq/L — ABNORMAL LOW (ref 135–145)
Sodium: 132 mEq/L — ABNORMAL LOW (ref 135–145)
Sodium: 133 mEq/L — ABNORMAL LOW (ref 135–145)

## 2010-07-01 LAB — COMPREHENSIVE METABOLIC PANEL
ALT: 22 U/L (ref 0–53)
ALT: 25 U/L (ref 0–53)
AST: 39 U/L — ABNORMAL HIGH (ref 0–37)
Alkaline Phosphatase: 108 U/L (ref 39–117)
Alkaline Phosphatase: 99 U/L (ref 39–117)
BUN: 10 mg/dL (ref 6–23)
BUN: 11 mg/dL (ref 6–23)
CO2: 23 mEq/L (ref 19–32)
CO2: 24 mEq/L (ref 19–32)
CO2: 24 mEq/L (ref 19–32)
Calcium: 7.3 mg/dL — ABNORMAL LOW (ref 8.4–10.5)
Calcium: 7.5 mg/dL — ABNORMAL LOW (ref 8.4–10.5)
Chloride: 103 mEq/L (ref 96–112)
Chloride: 98 mEq/L (ref 96–112)
GFR calc Af Amer: 37 mL/min — ABNORMAL LOW (ref >60)
GFR calc non Af Amer: 31 mL/min — ABNORMAL LOW (ref >60)
GFR calc non Af Amer: 59 mL/min — ABNORMAL LOW (ref >60)
GFR calc non Af Amer: >60 mL/min (ref >60)
Glucose, Bld: 105 mg/dL — ABNORMAL HIGH (ref 70–99)
Glucose, Bld: 97 mg/dL (ref 70–99)
Potassium: 3 mEq/L — ABNORMAL LOW (ref 3.5–5.1)
Potassium: 4 mEq/L (ref 3.5–5.1)
Sodium: 134 mEq/L — ABNORMAL LOW (ref 135–145)
Sodium: 137 mEq/L (ref 135–145)
Total Bilirubin: 1.6 mg/dL — ABNORMAL HIGH (ref 0.3–1.2)

## 2010-07-01 LAB — FERRITIN: Ferritin: 290 ng/mL (ref 22–322)

## 2010-07-01 LAB — URINE MICROSCOPIC-ADD ON

## 2010-07-01 LAB — CARDIAC PANEL(CRET KIN+CKTOT+MB+TROPI)
CK, MB: 4.1 ng/mL — ABNORMAL HIGH (ref 0.3–4.0)
CK, MB: 4.1 ng/mL — ABNORMAL HIGH (ref 0.3–4.0)
Relative Index: 1.3 (ref 0.0–2.5)
Total CK: 300 U/L — ABNORMAL HIGH (ref 7–232)
Total CK: 321 U/L — ABNORMAL HIGH (ref 7–232)
Troponin I: 0.03 ng/mL (ref 0.00–0.06)

## 2010-07-01 LAB — MAGNESIUM
Magnesium: 0.8 mg/dL — CL (ref 1.5–2.5)
Magnesium: 1.1 mg/dL — ABNORMAL LOW (ref 1.5–2.5)
Magnesium: 1.3 mg/dL — ABNORMAL LOW (ref 1.5–2.5)
Magnesium: 1.4 mg/dL — ABNORMAL LOW (ref 1.5–2.5)
Magnesium: 1.4 mg/dL — ABNORMAL LOW (ref 1.5–2.5)

## 2010-07-01 LAB — URINALYSIS, ROUTINE W REFLEX MICROSCOPIC
Nitrite: POSITIVE — AB
Specific Gravity, Urine: 1.023 (ref 1.005–1.030)
pH: 5 (ref 5.0–8.0)

## 2010-07-01 LAB — CBC
HCT: 31 % — ABNORMAL LOW (ref 39.0–52.0)
HCT: 31.2 % — ABNORMAL LOW (ref 39.0–52.0)
HCT: 31.4 % — ABNORMAL LOW (ref 39.0–52.0)
HCT: 33.2 % — ABNORMAL LOW (ref 39.0–52.0)
HCT: 33.3 % — ABNORMAL LOW (ref 39.0–52.0)
Hemoglobin: 10.5 g/dL — ABNORMAL LOW (ref 13.0–17.0)
Hemoglobin: 10.6 g/dL — ABNORMAL LOW (ref 13.0–17.0)
Hemoglobin: 11 g/dL — ABNORMAL LOW (ref 13.0–17.0)
Hemoglobin: 12 g/dL — ABNORMAL LOW (ref 13.0–17.0)
MCH: 32.7 pg (ref 26.0–34.0)
MCH: 33.1 pg (ref 26.0–34.0)
MCH: 33.3 pg (ref 26.0–34.0)
MCHC: 33.1 g/dL (ref 30.0–36.0)
MCHC: 33.7 g/dL (ref 30.0–36.0)
MCHC: 34.2 g/dL (ref 30.0–36.0)
MCV: 96.9 fL (ref 78.0–100.0)
MCV: 96.9 fL (ref 78.0–100.0)
MCV: 97.2 fL (ref 78.0–100.0)
MCV: 97.2 fL (ref 78.0–100.0)
MCV: 98.8 fL (ref 78.0–100.0)
Platelets: 106 10*3/uL — ABNORMAL LOW (ref 150–400)
Platelets: 66 10*3/uL — ABNORMAL LOW (ref 150–400)
RBC: 3.23 MIL/uL — ABNORMAL LOW (ref 4.22–5.81)
RBC: 3.36 MIL/uL — ABNORMAL LOW (ref 4.22–5.81)
RBC: 3.6 MIL/uL — ABNORMAL LOW (ref 4.22–5.81)
RDW: 16.9 % — ABNORMAL HIGH (ref 11.5–15.5)
RDW: 17.4 % — ABNORMAL HIGH (ref 11.5–15.5)
RDW: 17.7 % — ABNORMAL HIGH (ref 11.5–15.5)
WBC: 6 10*3/uL (ref 4.0–10.5)
WBC: 6.9 10*3/uL (ref 4.0–10.5)
WBC: 7.6 10*3/uL (ref 4.0–10.5)

## 2010-07-01 LAB — TROPONIN I: Troponin I: 0.02 ng/mL (ref 0.00–0.06)

## 2010-07-01 LAB — RAPID URINE DRUG SCREEN, HOSP PERFORMED
Amphetamines: NOT DETECTED
Barbiturates: NOT DETECTED

## 2010-07-01 LAB — FOLATE: Folate: 15.2 ng/mL

## 2010-07-01 LAB — HEMOGLOBIN A1C: Mean Plasma Glucose: 180 mg/dL — ABNORMAL HIGH (ref <117)

## 2010-07-01 LAB — BRAIN NATRIURETIC PEPTIDE: Pro B Natriuretic peptide (BNP): 74 pg/mL (ref 0.0–100.0)

## 2010-07-01 LAB — CK TOTAL AND CKMB (NOT AT ARMC): Total CK: 254 U/L — ABNORMAL HIGH (ref 7–232)

## 2010-07-01 LAB — MRSA PCR SCREENING: MRSA by PCR: NEGATIVE

## 2010-07-01 LAB — IRON AND TIBC: UIBC: 144 ug/dL

## 2010-07-08 LAB — CBC
HCT: 30.3 % — ABNORMAL LOW (ref 39.0–52.0)
HCT: 27 % — ABNORMAL LOW (ref 39.0–52.0)
HCT: 27.2 % — ABNORMAL LOW (ref 39.0–52.0)
HCT: 31.1 % — ABNORMAL LOW (ref 39.0–52.0)
HCT: 33.1 % — ABNORMAL LOW (ref 39.0–52.0)
Hemoglobin: 10.5 g/dL — ABNORMAL LOW (ref 13.0–17.0)
Hemoglobin: 10.3 g/dL — ABNORMAL LOW (ref 13.0–17.0)
Hemoglobin: 11.4 g/dL — ABNORMAL LOW (ref 13.0–17.0)
MCHC: 34.5 g/dL (ref 30.0–36.0)
MCHC: 34.5 g/dL (ref 30.0–36.0)
MCHC: 34.6 g/dL (ref 30.0–36.0)
MCHC: 34.6 g/dL (ref 30.0–36.0)
MCHC: 34.7 g/dL (ref 30.0–36.0)
MCHC: 35 g/dL (ref 30.0–36.0)
MCV: 95.4 fL (ref 78.0–100.0)
MCV: 97.4 fL (ref 78.0–100.0)
MCV: 95.4 fL (ref 78.0–100.0)
MCV: 97.2 fL (ref 78.0–100.0)
Platelets: 406 10*3/uL — ABNORMAL HIGH (ref 150–400)
Platelets: 278 10*3/uL (ref 150–400)
Platelets: 177 10*3/uL (ref 150–400)
Platelets: 190 10*3/uL (ref 150–400)
Platelets: 213 10*3/uL (ref 150–400)
Platelets: 70 10*3/uL — ABNORMAL LOW (ref 150–400)
Platelets: 70 10*3/uL — ABNORMAL LOW (ref 150–400)
Platelets: 77 10*3/uL — ABNORMAL LOW (ref 150–400)
Platelets: DECREASED 10*3/uL (ref 150–400)
RBC: 3.18 MIL/uL — ABNORMAL LOW (ref 4.22–5.81)
RBC: 2.77 MIL/uL — ABNORMAL LOW (ref 4.22–5.81)
RBC: 3.41 MIL/uL — ABNORMAL LOW (ref 4.22–5.81)
RBC: 3.51 MIL/uL — ABNORMAL LOW (ref 4.22–5.81)
RDW: 15.2 % (ref 11.5–15.5)
RDW: 15.1 % (ref 11.5–15.5)
RDW: 14.7 % (ref 11.5–15.5)
RDW: 15.1 % (ref 11.5–15.5)
RDW: 15.2 % (ref 11.5–15.5)
RDW: 15.4 % (ref 11.5–15.5)
RDW: 15.9 % — ABNORMAL HIGH (ref 11.5–15.5)
RDW: 15.9 % — ABNORMAL HIGH (ref 11.5–15.5)
WBC: 5.9 10*3/uL (ref 4.0–10.5)
WBC: 6.9 10*3/uL (ref 4.0–10.5)
WBC: 5.7 10*3/uL (ref 4.0–10.5)
WBC: 6.2 10*3/uL (ref 4.0–10.5)
WBC: 6.4 10*3/uL (ref 4.0–10.5)
WBC: 6.8 10*3/uL (ref 4.0–10.5)
WBC: 7.2 10*3/uL (ref 4.0–10.5)
WBC: 9.4 10*3/uL (ref 4.0–10.5)

## 2010-07-08 LAB — GLUCOSE, CAPILLARY
Glucose-Capillary: 118 mg/dL — ABNORMAL HIGH (ref 70–99)
Glucose-Capillary: 121 mg/dL — ABNORMAL HIGH (ref 70–99)
Glucose-Capillary: 144 mg/dL — ABNORMAL HIGH (ref 70–99)
Glucose-Capillary: 154 mg/dL — ABNORMAL HIGH (ref 70–99)
Glucose-Capillary: 171 mg/dL — ABNORMAL HIGH (ref 70–99)
Glucose-Capillary: 175 mg/dL — ABNORMAL HIGH (ref 70–99)
Glucose-Capillary: 179 mg/dL — ABNORMAL HIGH (ref 70–99)
Glucose-Capillary: 251 mg/dL — ABNORMAL HIGH (ref 70–99)
Glucose-Capillary: 103 mg/dL — ABNORMAL HIGH (ref 70–99)
Glucose-Capillary: 124 mg/dL — ABNORMAL HIGH (ref 70–99)
Glucose-Capillary: 242 mg/dL — ABNORMAL HIGH (ref 70–99)
Glucose-Capillary: 134 mg/dL — ABNORMAL HIGH (ref 70–99)
Glucose-Capillary: 139 mg/dL — ABNORMAL HIGH (ref 70–99)
Glucose-Capillary: 143 mg/dL — ABNORMAL HIGH (ref 70–99)
Glucose-Capillary: 152 mg/dL — ABNORMAL HIGH (ref 70–99)
Glucose-Capillary: 159 mg/dL — ABNORMAL HIGH (ref 70–99)
Glucose-Capillary: 167 mg/dL — ABNORMAL HIGH (ref 70–99)
Glucose-Capillary: 184 mg/dL — ABNORMAL HIGH (ref 70–99)
Glucose-Capillary: 187 mg/dL — ABNORMAL HIGH (ref 70–99)
Glucose-Capillary: 192 mg/dL — ABNORMAL HIGH (ref 70–99)
Glucose-Capillary: 200 mg/dL — ABNORMAL HIGH (ref 70–99)
Glucose-Capillary: 209 mg/dL — ABNORMAL HIGH (ref 70–99)
Glucose-Capillary: 210 mg/dL — ABNORMAL HIGH (ref 70–99)
Glucose-Capillary: 223 mg/dL — ABNORMAL HIGH (ref 70–99)
Glucose-Capillary: 227 mg/dL — ABNORMAL HIGH (ref 70–99)
Glucose-Capillary: 244 mg/dL — ABNORMAL HIGH (ref 70–99)
Glucose-Capillary: 286 mg/dL — ABNORMAL HIGH (ref 70–99)
Glucose-Capillary: 83 mg/dL (ref 70–99)

## 2010-07-08 LAB — COMPREHENSIVE METABOLIC PANEL
ALT: 22 U/L (ref 0–53)
ALT: 19 U/L (ref 0–53)
ALT: 23 U/L (ref 0–53)
ALT: 28 U/L (ref 0–53)
ALT: 47 U/L (ref 0–53)
ALT: 52 U/L (ref 0–53)
AST: 22 U/L (ref 0–37)
AST: 24 U/L (ref 0–37)
AST: 19 U/L (ref 0–37)
AST: 27 U/L (ref 0–37)
AST: 39 U/L — ABNORMAL HIGH (ref 0–37)
AST: 55 U/L — ABNORMAL HIGH (ref 0–37)
Albumin: 3.2 g/dL — ABNORMAL LOW (ref 3.5–5.2)
Albumin: 2.6 g/dL — ABNORMAL LOW (ref 3.5–5.2)
Albumin: 2.6 g/dL — ABNORMAL LOW (ref 3.5–5.2)
Albumin: 2.8 g/dL — ABNORMAL LOW (ref 3.5–5.2)
Albumin: 2.9 g/dL — ABNORMAL LOW (ref 3.5–5.2)
Albumin: 3.1 g/dL — ABNORMAL LOW (ref 3.5–5.2)
Albumin: 3.4 g/dL — ABNORMAL LOW (ref 3.5–5.2)
Alkaline Phosphatase: 105 U/L (ref 39–117)
Alkaline Phosphatase: 78 U/L (ref 39–117)
Alkaline Phosphatase: 108 U/L (ref 39–117)
Alkaline Phosphatase: 87 U/L (ref 39–117)
Alkaline Phosphatase: 91 U/L (ref 39–117)
BUN: 21 mg/dL (ref 6–23)
BUN: 16 mg/dL (ref 6–23)
BUN: 10 mg/dL (ref 6–23)
BUN: 23 mg/dL (ref 6–23)
BUN: 3 mg/dL — ABNORMAL LOW (ref 6–23)
CO2: 25 mEq/L (ref 19–32)
CO2: 23 mEq/L (ref 19–32)
Calcium: 7.9 mg/dL — ABNORMAL LOW (ref 8.4–10.5)
Calcium: 8 mg/dL — ABNORMAL LOW (ref 8.4–10.5)
Calcium: 8.7 mg/dL (ref 8.4–10.5)
Calcium: 8.8 mg/dL (ref 8.4–10.5)
Chloride: 101 mEq/L (ref 96–112)
Chloride: 106 mEq/L (ref 96–112)
Chloride: 107 mEq/L (ref 96–112)
Chloride: 102 mEq/L (ref 96–112)
Chloride: 104 mEq/L (ref 96–112)
Chloride: 96 mEq/L (ref 96–112)
Creatinine, Ser: 1.71 mg/dL — ABNORMAL HIGH (ref 0.4–1.5)
Creatinine, Ser: 1.91 mg/dL — ABNORMAL HIGH (ref 0.4–1.5)
Creatinine, Ser: 1.27 mg/dL (ref 0.4–1.5)
Creatinine, Ser: 1.35 mg/dL (ref 0.4–1.5)
GFR calc Af Amer: 42 mL/min — ABNORMAL LOW (ref >60)
GFR calc Af Amer: 48 mL/min — ABNORMAL LOW (ref >60)
GFR calc Af Amer: >60 mL/min (ref >60)
GFR calc Af Amer: >60 mL/min (ref >60)
GFR calc Af Amer: >60 mL/min (ref >60)
GFR calc Af Amer: >60 mL/min (ref >60)
GFR calc Af Amer: >60 mL/min (ref >60)
GFR calc non Af Amer: 40 mL/min — ABNORMAL LOW (ref >60)
GFR calc non Af Amer: >60 mL/min (ref >60)
Glucose, Bld: 147 mg/dL — ABNORMAL HIGH (ref 70–99)
Glucose, Bld: 115 mg/dL — ABNORMAL HIGH (ref 70–99)
Glucose, Bld: 167 mg/dL — ABNORMAL HIGH (ref 70–99)
Glucose, Bld: 70 mg/dL (ref 70–99)
Potassium: 4.3 mEq/L (ref 3.5–5.1)
Potassium: 4.1 mEq/L (ref 3.5–5.1)
Potassium: 3.2 mEq/L — ABNORMAL LOW (ref 3.5–5.1)
Potassium: 3.8 mEq/L (ref 3.5–5.1)
Potassium: 3.9 mEq/L (ref 3.5–5.1)
Potassium: 4.4 mEq/L (ref 3.5–5.1)
Sodium: 138 mEq/L (ref 135–145)
Sodium: 133 mEq/L — ABNORMAL LOW (ref 135–145)
Sodium: 135 mEq/L (ref 135–145)
Sodium: 136 mEq/L (ref 135–145)
Sodium: 141 mEq/L (ref 135–145)
Total Bilirubin: 0.4 mg/dL (ref 0.3–1.2)
Total Bilirubin: 0.5 mg/dL (ref 0.3–1.2)
Total Bilirubin: 0.5 mg/dL (ref 0.3–1.2)
Total Bilirubin: 0.7 mg/dL (ref 0.3–1.2)
Total Bilirubin: 1.2 mg/dL (ref 0.3–1.2)
Total Protein: 7.8 g/dL (ref 6.0–8.3)
Total Protein: 6.2 g/dL (ref 6.0–8.3)
Total Protein: 6.5 g/dL (ref 6.0–8.3)
Total Protein: 6.7 g/dL (ref 6.0–8.3)
Total Protein: 7.2 g/dL (ref 6.0–8.3)
Total Protein: 7.5 g/dL (ref 6.0–8.3)
Total Protein: 7.5 g/dL (ref 6.0–8.3)
Total Protein: 7.6 g/dL (ref 6.0–8.3)

## 2010-07-08 LAB — RAPID URINE DRUG SCREEN, HOSP PERFORMED
Barbiturates: NOT DETECTED
Cocaine: NOT DETECTED
Cocaine: NOT DETECTED
Opiates: NOT DETECTED
Tetrahydrocannabinol: NOT DETECTED

## 2010-07-08 LAB — CULTURE, BLOOD (ROUTINE X 2)
Culture: NO GROWTH
Culture: NO GROWTH

## 2010-07-08 LAB — CK TOTAL AND CKMB (NOT AT ARMC)
CK, MB: 3.4 ng/mL (ref 0.3–4.0)
CK, MB: 2.9 ng/mL (ref 0.3–4.0)
Relative Index: 2.2 (ref 0.0–2.5)
Relative Index: 0.3 (ref 0.0–2.5)

## 2010-07-08 LAB — URINALYSIS, MICROSCOPIC ONLY
Glucose, UA: NEGATIVE mg/dL
Protein, ur: NEGATIVE mg/dL
Specific Gravity, Urine: 1.012 (ref 1.005–1.030)
Urobilinogen, UA: 2 mg/dL — ABNORMAL HIGH (ref 0.0–1.0)

## 2010-07-08 LAB — URIC ACID: Uric Acid, Serum: 5.6 mg/dL (ref 4.0–7.8)

## 2010-07-08 LAB — URINE CULTURE
Colony Count: >=100000
Culture: NO GROWTH
Culture: NO GROWTH

## 2010-07-08 LAB — CARDIAC PANEL(CRET KIN+CKTOT+MB+TROPI)
CK, MB: 2.5 ng/mL (ref 0.3–4.0)
CK, MB: 2.7 ng/mL (ref 0.3–4.0)
CK, MB: 2.9 ng/mL (ref 0.3–4.0)
CK, MB: 3.3 ng/mL (ref 0.3–4.0)
Relative Index: 0.3 (ref 0.0–2.5)
Relative Index: 0.4 (ref 0.0–2.5)
Total CK: 115 U/L (ref 7–232)
Total CK: 151 U/L (ref 7–232)
Total CK: 1106 U/L — ABNORMAL HIGH (ref 7–232)
Total CK: 666 U/L — ABNORMAL HIGH (ref 7–232)
Troponin I: 0.03 ng/mL (ref 0.00–0.06)
Troponin I: 0.03 ng/mL (ref 0.00–0.06)

## 2010-07-08 LAB — URINALYSIS, ROUTINE W REFLEX MICROSCOPIC
Bilirubin Urine: NEGATIVE
Glucose, UA: NEGATIVE mg/dL
Glucose, UA: >1000 mg/dL — AB
Leukocytes, UA: NEGATIVE
Protein, ur: NEGATIVE mg/dL
Specific Gravity, Urine: 1.006 (ref 1.005–1.030)
Specific Gravity, Urine: 1.012 (ref 1.005–1.030)
pH: 8 (ref 5.0–8.0)

## 2010-07-08 LAB — DIFFERENTIAL
Basophils Absolute: 0.1 10*3/uL (ref 0.0–0.1)
Eosinophils Absolute: 0 10*3/uL (ref 0.0–0.7)
Eosinophils Absolute: 0.1 10*3/uL (ref 0.0–0.7)
Eosinophils Relative: 0 % (ref 0–5)
Eosinophils Relative: 1 % (ref 0–5)
Eosinophils Relative: 1 % (ref 0–5)
Lymphocytes Relative: 12 % (ref 12–46)
Lymphocytes Relative: 27 % (ref 12–46)
Lymphocytes Relative: 21 % (ref 12–46)
Lymphs Abs: 1.3 10*3/uL (ref 0.7–4.0)
Lymphs Abs: 1.6 10*3/uL (ref 0.7–4.0)
Monocytes Absolute: 0.5 10*3/uL (ref 0.1–1.0)
Monocytes Absolute: 0.6 10*3/uL (ref 0.1–1.0)
Monocytes Absolute: 1 10*3/uL (ref 0.1–1.0)
Monocytes Relative: 8 % (ref 3–12)
Monocytes Relative: 10 % (ref 3–12)
Monocytes Relative: 14 % — ABNORMAL HIGH (ref 3–12)
Neutro Abs: 3.7 10*3/uL (ref 1.7–7.7)
Neutro Abs: 4.4 10*3/uL (ref 1.7–7.7)

## 2010-07-08 LAB — BASIC METABOLIC PANEL
BUN: 16 mg/dL (ref 6–23)
BUN: 13 mg/dL (ref 6–23)
BUN: 8 mg/dL (ref 6–23)
CO2: 25 mEq/L (ref 19–32)
CO2: 25 mEq/L (ref 19–32)
Calcium: 8.6 mg/dL (ref 8.4–10.5)
Chloride: 104 mEq/L (ref 96–112)
Chloride: 110 mEq/L (ref 96–112)
Chloride: 103 mEq/L (ref 96–112)
Creatinine, Ser: 1.06 mg/dL (ref 0.4–1.5)
Creatinine, Ser: 1.05 mg/dL (ref 0.4–1.5)
GFR calc Af Amer: >60 mL/min (ref >60)
GFR calc Af Amer: >60 mL/min (ref >60)
GFR calc Af Amer: >60 mL/min (ref >60)
GFR calc non Af Amer: 52 mL/min — ABNORMAL LOW (ref >60)
GFR calc non Af Amer: >60 mL/min (ref >60)
Glucose, Bld: 170 mg/dL — ABNORMAL HIGH (ref 70–99)
Glucose, Bld: 116 mg/dL — ABNORMAL HIGH (ref 70–99)
Potassium: 4.1 mEq/L (ref 3.5–5.1)
Potassium: 4.7 mEq/L (ref 3.5–5.1)
Potassium: 4.2 mEq/L (ref 3.5–5.1)
Potassium: 4 mEq/L (ref 3.5–5.1)
Sodium: 137 mEq/L (ref 135–145)
Sodium: 136 mEq/L (ref 135–145)

## 2010-07-08 LAB — CORTISOL: Cortisol, Plasma: 20.7 ug/dL

## 2010-07-08 LAB — TSH
TSH: 1.485 u[IU]/mL (ref 0.350–4.500)
TSH: 2.643 u[IU]/mL (ref 0.350–4.500)

## 2010-07-08 LAB — IRON AND TIBC: Iron: 82 ug/dL (ref 42–135)

## 2010-07-08 LAB — TROPONIN I: Troponin I: 0.03 ng/mL (ref 0.00–0.06)

## 2010-07-08 LAB — PROTIME-INR
INR: 1.53 — ABNORMAL HIGH (ref 0.00–1.49)
INR: 1.64 — ABNORMAL HIGH (ref 0.00–1.49)
INR: 1.33 (ref 0.00–1.49)
INR: 1.13 (ref 0.00–1.49)
Prothrombin Time: 15.9 seconds — ABNORMAL HIGH (ref 11.6–15.2)
Prothrombin Time: 18.3 seconds — ABNORMAL HIGH (ref 11.6–15.2)
Prothrombin Time: 19.3 seconds — ABNORMAL HIGH (ref 11.6–15.2)
Prothrombin Time: 14.4 seconds (ref 11.6–15.2)

## 2010-07-08 LAB — SODIUM, URINE, RANDOM: Sodium, Ur: 88 mEq/L

## 2010-07-08 LAB — BRAIN NATRIURETIC PEPTIDE: Pro B Natriuretic peptide (BNP): 118 pg/mL — ABNORMAL HIGH (ref 0.0–100.0)

## 2010-07-08 LAB — URINE MICROSCOPIC-ADD ON

## 2010-07-08 LAB — LIPID PANEL
HDL: 92 mg/dL (ref >39)
Total CHOL/HDL Ratio: 2 RATIO
VLDL: 15 mg/dL (ref 0–40)

## 2010-07-08 LAB — MRSA PCR SCREENING: MRSA by PCR: NEGATIVE

## 2010-07-08 LAB — HEMOGLOBIN A1C
Mean Plasma Glucose: 174 mg/dL — ABNORMAL HIGH (ref <117)
Mean Plasma Glucose: 192 mg/dL — ABNORMAL HIGH (ref <117)

## 2010-07-08 LAB — T4, FREE: Free T4: 1.45 ng/dL (ref 0.80–1.80)

## 2010-07-08 LAB — AMMONIA
Ammonia: 28 umol/L (ref 11–35)
Ammonia: 35 umol/L (ref 11–35)

## 2010-07-08 LAB — CK: Total CK: 1405 U/L — ABNORMAL HIGH (ref 7–232)

## 2010-07-08 LAB — MAGNESIUM
Magnesium: 1.5 mg/dL (ref 1.5–2.5)
Magnesium: 1.4 mg/dL — ABNORMAL LOW (ref 1.5–2.5)

## 2010-07-08 LAB — ETHANOL: Alcohol, Ethyl (B): 344 mg/dL — ABNORMAL HIGH (ref 0–10)

## 2010-07-08 LAB — LIPASE, BLOOD: Lipase: 42 U/L (ref 11–59)

## 2010-07-30 LAB — URINALYSIS, ROUTINE W REFLEX MICROSCOPIC
Ketones, ur: NEGATIVE mg/dL
Nitrite: NEGATIVE
Specific Gravity, Urine: 1.012 (ref 1.005–1.030)
pH: 5.5 (ref 5.0–8.0)

## 2010-08-16 ENCOUNTER — Ambulatory Visit (INDEPENDENT_AMBULATORY_CARE_PROVIDER_SITE_OTHER): Payer: Medicare Other | Admitting: Internal Medicine

## 2010-08-16 ENCOUNTER — Encounter: Payer: Self-pay | Admitting: Internal Medicine

## 2010-08-16 DIAGNOSIS — I2510 Atherosclerotic heart disease of native coronary artery without angina pectoris: Secondary | ICD-10-CM

## 2010-08-16 DIAGNOSIS — I10 Essential (primary) hypertension: Secondary | ICD-10-CM

## 2010-08-16 DIAGNOSIS — E119 Type 2 diabetes mellitus without complications: Secondary | ICD-10-CM

## 2010-08-16 DIAGNOSIS — I4891 Unspecified atrial fibrillation: Secondary | ICD-10-CM

## 2010-08-16 LAB — GLUCOSE, POCT (MANUAL RESULT ENTRY): POC Glucose: 151

## 2010-08-16 NOTE — Progress Notes (Signed)
  Subjective:    Patient ID: Johnny [name], male    DOB: 1935-03-17, 76 y.o.   MRN: [id number]  HPI  76 year old patient who is seen today for followup. He has coronary artery disease which has been stable. He has type 2 diabetes not controlled on 15 units of Lantus at bedtime. He is maintained a very nice glycemic control. His last hemoglobin A1c 2 months ago was 6.8. He denies any hypoglycemia. Fasting blood sugars are generally quite well controlled. He has atrial fibrillation which has been stable;  he has a history of EtOH    Review of Systems  Constitutional: Negative for fever, chills, appetite change and fatigue.  HENT: Negative for hearing loss, ear pain, congestion, sore throat, trouble swallowing, neck stiffness, dental problem, voice change and tinnitus.   Eyes: Negative for pain, discharge and visual disturbance.  Respiratory: Negative for cough, chest tightness, wheezing and stridor.   Cardiovascular: Negative for chest pain, palpitations and leg swelling.  Gastrointestinal: Negative for nausea, vomiting, abdominal pain, diarrhea, constipation, blood in stool and abdominal distention.  Genitourinary: Negative for urgency, hematuria, flank pain, discharge, difficulty urinating and genital sores.  Musculoskeletal: Negative for myalgias, back pain, joint swelling, arthralgias and gait problem.  Skin: Negative for rash.  Neurological: Negative for dizziness, syncope, speech difficulty, weakness, numbness and headaches.  Hematological: Negative for adenopathy. Does not bruise/bleed easily.  Psychiatric/Behavioral: Negative for behavioral problems and dysphoric mood. The patient is not nervous/anxious.        Objective:   Physical Exam  Constitutional: He is oriented to person, place, and time. He appears well-developed.       Appears chronically ill but in no acute distress;  appears older than his stated age; blood pressure is 140/70  HENT:  Head: Normocephalic.  Right Ear:  External ear normal.  Left Ear: External ear normal.  Eyes: Conjunctivae and EOM are normal.  Neck: Normal range of motion.  Cardiovascular: Normal rate, regular rhythm and normal heart sounds.   Pulmonary/Chest: Breath sounds normal.  Abdominal: Bowel sounds are normal.  Musculoskeletal: Normal range of motion. He exhibits no edema and no tenderness.  Neurological: He is alert and oriented to person, place, and time.  Psychiatric: He has a normal mood and affect. His behavior is normal.          Assessment & Plan:  Diabetes mellitus. Seems stable. We'll continue his present regimen Coronary artery disease stable Hypertension  We'll check a hemoglobin A1c in 3 months. Medical regimen unchanged. If patient remains stable consider statin therapy next visit

## 2010-08-16 NOTE — Patient Instructions (Signed)
Please check your hemoglobin A1c every 3 months    It is important that you exercise regularly, at least 20 minutes 3 to 4 times per week.  If you develop chest pain or shortness of breath seek  medical attention.  Please check your blood pressure on a regular basis.  If it is consistently greater than 150/90, please make an office appointment.   Return in 3 months for follow-up
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
## 2010-09-03 NOTE — Assessment & Plan Note (Signed)
[facility]                            CARDIOLOGY OFFICE NOTE   IZAIHA, LO                           MRN:          [id number]  DATE:12/29/2007                            DOB:          1934-10-09    PRIMARY CARE PHYSICIAN:  Gordy Savers, MD   INTERVAL HISTORY:  Mr. Catala is a 76 year old male with a history of  coronary artery disease, status post bypass surgery in 1998.  He also  has diabetes, hypertension, hyperlipidemia, and history of alcohol  abuse.  He was admitted in July with altered mental status in the  setting of pneumonia.  He developed atrial flutter with rapid  ventricular rate.  He subsequently underwent atrial flutter ablation,  which was successful.  He followed back up with Dr. Graciela Husbands a couple of  weeks ago.  The plan was initially to stop his Coumadin.  However, he  had found out that Mr. Kimberley was actually put on Coumadin back in April  for atrial fibrillation and not flutter, so his Coumadin was continued.   He returns today for routine followup with me.  He denies any chest pain  or shortness of breath.  He has not had any palpitations.  He said his  main limitation is significant back pain.  He tells me he has stopped  drinking.   REVIEW OF SYSTEMS:  Negative except for as above.   CURRENT MEDICATIONS:  1. Coumadin.  2. Verapamil 120 b.i.d.  3. Hydrochlorothiazide 25 a day.  4. Amaryl 2 mg a day.  5. Lotensin 20 a day.  6. Clonidine 0.1 b.i.d.  7. Lipitor 20 a day.   PHYSICAL EXAMINATION:  GENERAL:  He is an elderly male, in no acute  distress. He ambulates around the clinic without any respiratory  difficulty.  VITAL SIGNS:  Blood pressure is 124/70, heart rate is 85, and weight is  188.  HEENT:  Normal.  NECK:  Supple.  No JVD.  Carotids are 2+ bilaterally without any bruits.  There is no lymphadenopathy or thyromegaly.  CARDIAC:  PMI is nondisplaced.  Regular rate and rhythm.  No murmurs,  rubs, or  gallops.  LUNGS:  Clear.  ABDOMEN:  Soft, nontender, and nondistended.  No hepatosplenomegaly.  No  bruits.  No masses.  Good bowel sounds.  EXTREMITIES:  Warm with no  cyanosis, clubbing, or edema.  No rash.  NEUROLOGIC:  Alert and oriented x3.  Cranial nerves II through XII are  intact.  Moves all 4 extremities without difficulty.  Affect is  pleasant.   EKG shows sinus rhythm, rate of 75.  No ST-T wave abnormalities.   ASSESSMENT AND PLAN:  1. Coronary artery disease is stable.  No evidence of ischemia.      Continue current therapy.  2. Hypertension, well controlled.  3. Hyperlipidemia is followed by Dr. Amador Cunas.  Goal LDL is less      than 70.  4. Atrial fibrillation/flutter.  He is status post flutter ablation.      Given his history of atrial fibrillation, I do think  it is      reasonable to continue him on his Coumadin.  Given his elevated      CHADS score, I do worry somewhat about underlying liver disease,      but fortunately he has stopped drinking and does not appear to be      having problems with his anticoagulation.   DISPOSITION:  We will see him back in a year for routine followup.     Bevelyn Buckles. Bensimhon, MD  Electronically Signed    DRB/MedQ  DD: 12/29/2007  DT: 12/30/2007  Job #: [id number]   cc:   Gordy Savers, MD
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
## 2010-09-06 NOTE — H&P (Signed)
[place]  Patient:    Johnny [name], Johnny [name]                           MRN: [id number] Adm. Date:  [id number] Attending:  Devoria Albe CC:         Dr. Gordy Savers, [facility]   History and Physical  DATE OF BIRTH:  1934-12-08  CHIEF COMPLAINT:  None.  HISTORY OF PRESENT ILLNESS:  The patient is a 76 year old black male who was brought by EMS with symptoms of confusion occurring over the past day or two. He was given Ativan in the ER for shakes, and IV magnesium.  I am told by the staff that he was saying people were in the room when they were absent, talking to them, etc.  He was noted to have tremor.  PAST MEDICAL HISTORY:  Coronary artery disease, bypass surgery, alcohol abuse, diabetes type 2, hypertension.  MEDICATIONS:  Glucophage, Skelaxin, Celebrex, metoprolol - doses unknown.  SOCIAL HISTORY:  Alcohol abuse; otherwise, unknown.  FAMILY HISTORY:  Unknown.  ALLERGIES:  None.  REVIEW OF SYSTEMS:  Unable to obtain.  PHYSICAL EXAMINATION:  VITAL SIGNS:  O2 saturations 100%, temperature 98.9, blood pressure 156/96, heart rate 122, respirations 24.  GENERAL:  The patient is in no acute distress.  HEENT:  Slightly dryish oral mucosa.  Eyes with purulent discharge bilaterally.  NECK:  Supple, no meningeal signs, no thyromegaly, no bruit.  LUNGS:  Decreased breath sounds, no wheezes or rales.  HEART:  S1, S2, no gallops.  He is tachycardic.  No enlargement to percussion.  ABDOMEN:  Soft, nontender.  No organomegaly, no masse felt.  EXTREMITIES:  Lower extremities with atrophic changes, slightly decreased peripheral pulse.  NAILS:  Toenails with onychomycosis.  NEUROLOGIC:  The patient is alert, cooperative.  He is mumbling something all the time.  He is apparently confused.  He has tremor in the extent of the extremities.  No asterixis.  LABORATORY DATA:  White count 10.3, hemoglobin 11.8.  Total bilirubin 2.8. CK 351,  MB 2.3.  Magnesium 0.7.  PTT/INR normal.  MC 100.5.  Creatinine 2.7, BUN 31.  EKG with sinus tachycardia.  CT of the head with atrophy and no acute changes.  Chest x-ray without acute pathology.  ASSESSMENT AND PLAN: 1. Confusion/delirium, likely related to alcohol withdrawal.  Will admit for    Librium, Haldol, vitamin B1, rehydrate. 2. Hypomagnesemia.  Will correct IV, recheck tomorrow. 3. Acute renal failure versus chronic renal failure, likely exacerbated by    possible dehydration. 4. Bilateral conjunctivitis.  We will use erythromycin ointment. 5. Diabetes mellitus type 2.  We will continue treatment with insulin sliding    scale. 6. Hypertension.  Continue to monitor. DD:  08/11/00 TD:  08/12/00 Job: [id number]
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
## 2010-09-06 NOTE — Discharge Summary (Signed)
[place]. [facility]  Patient:    Johnny [name], Johnny [name] Visit Number: [id number] MRN: [id number]          Service Type: MED Location: [phone] Attending Physician:  Dorena Cookey Dictated by:   Janora Norlander, N.P. Admit Date:  06/05/2001 Disc. Date: 06/08/01                             Discharge Summary  ADMISSION DIAGNOSIS:  Found unconscious.  DISCHARGE DIAGNOSES: 1. Seizure episode secondary to hypoglycemia. 2. Hypoglycemia. 3. Diabetes. 4. Hypertension. 5. Alcohol abuse.  DISCHARGE MEDICATIONS: 1. Toprol XL 50 mg tablets SR one p.o. q.d. 2. Lotensin 10 mg one p.o. q.d. 3. Iron pill FESO4 325 mg one p.o. q.d. 4. Protonix 40 mg one p.o. q.d. 5. Enteric-coated aspirin 325 mg one p.o. q.d. 6. Starlex 120 mg one p.o. t.i.d. before meals.  PRIMARY PHYSICIAN:  Gordy Savers, M.D.  FOLLOW-UP:  He is to follow up with his counseling and he is to see Dr. Rosanne Ashing in one week.  LABORATORY DATA:  CBC was essentially normal except for H&H of 11.2 and 33.0. His RBC was also 3.36.  His BMET on February 16, was normal with the exception of a glucose of 52.  His alcohol level on February 15, was 224, on February 16 it was less than 5.  Magnesium was normal at 1.6.  PT and INR were 13.9 and 1.1.  PTT slightly low at 21.  HOSPITAL COURSE:  Johnny [name] was admitted to investigate the cause of his unconsciousness.  He was found to be hypoglycemic as above.  He was given D50 1 amp slowly and he regained consciousness.  His repeat CBG was then 229 while in the ER.  During that time, he did have some other seizures which were of a shaking nature.  His CBG was retaken and it was low at 75, he was given another amp of D50 and his repeat CBG was at 130.  Alcohol levels as above. The patient was unable to provide any history at this time.  It was thought that his seizure episode was more likely related to hypoglycemia and not to alcohol withdrawal at  this point given his elevated alcohol level.  He was placed on Librium to manage any type of withdrawal symptoms during hospitalization.  No more seizure activities were noted after the above episodes. His blood glucose was monitored throughout his stay and they ranged from 50 to 296.  The patients family stated that he had been on glyburide at one point, however, he is being discharged on Starlex as above.  We also monitored his hypertension and he remained stable on the above regimen. During his hospitalization, we approached him about counseling for his alcoholism.  He stated that he had been through the AA program, but did not finish the program and that he is interested in outpatient rehabilitation. Social work was contacted and he was given the contact to the [facility] program per Child psychotherapist, case Production designer, theatre/television/film at [phone].  DISCHARGE PHYSICAL EXAMINATION:  He offers no complaints today.  He is awake, alert, and oriented.  He is pleasant.  His vital signs are stable.  He is 99% on room air.  His CBG at this point is 150.  His heart has a regular rate and rhythm.  His lungs are clear to auscultation.  He has trace edema which has improved during his hospitalization.  His hygeine still remains poor.  He has washed his upper body independently.  He is able to ambulate in his room stable and to dress himself and manage his a.m. care.  He is being discharged today with his family and agrees to persue counseling as an outpatient.  He understands that he needs to follow up with Gordy Savers, M.D. ine one week. Dictated by:   Janora Norlander, N.P. Attending Physician:  Dorena Cookey DD:  06/08/01 TD:  06/08/01 Job: [id number]

## 2010-09-06 NOTE — Discharge Summary (Signed)
[place]. [facility]  Patient:    Johnny [name], Johnny [name]                           MRN: [id number] Adm. Date:  [id number] Disc. Date: 08/19/00 Attending:  Tresa Garter CC:         Gordy Savers, M.D.   Discharge Summary  DISCHARGE DIAGNOSES: 1. Confusion. 2. Alcohol withdrawal. 3. Hepatic encephalopathy. 4. Hypomagnesium. 5. Acute renal insufficiency. 6. Macrocytic anemia. 7. Alcohol abuse. 8. Mechanical dysphagia.  BRIEF HISTORY:  [name] is a 76 year old African-American male who was brought into the emergency room by EMS with symptoms of confusion occurring over the past several days.  In the emergency room he was given Ativan for shakes and IV magnesium.  PAST MEDICAL HISTORY: 1. Coronary artery disease, status post CABG. 2. Alcohol abuse. 3. Non-insulin-dependent diabetes mellitus. 4. Hypertension.  HOSPITAL COURSE:  #1 - Mental status changes, confusion, and delirium.  This was most likely thought to be secondary to alcohol withdrawal.  The patient was started on a librium alcohol detoxification protocol.  There also appeared to be a component of hepatic encephalopathy as he had an elevated ammonia level. Because of this, lactulose was also added to the patients regimen.  The patients mental status has improved during this hospitalization.  We did have psychiatry see the patient.  Dr. Jeanie Sewer saw the patient.  He felt the patient had alcohol withdrawal delirium versus a psychotic disorder due to a severe medical condition.  He did make recommendations to continue the Librium protocol.  He also recommended adding folic acid and to use Haldol as needed. We do note, however, the patient lacks insight into his medical illnesses. The patient after being here seven days still does not know why he is in the hospital.  The patient has been oriented on a daily basis as to his reason for hospitalization, but still cannot site his medical  illnesses.  The patient also denies that he is an alcoholic.  We did ask case management to assist with discharge planning.  We recommended a nursing home placement with 24-hour supervision as we did not feel the patient was able to care for himself at home.  Physical therapy saw the patient and did agree with the plan.  However, the patient adamantly refuses nursing home placement.  The patient has been advised that it is against medical advice that he go home.  Arrangements have been made for the patient to have Home Health Services available to him at home.  We have explained to the family that he will require 24-hour supervision and that he should not drink any alcohol.  #2 - Dysphagia.  The patient had evidence of dysphagia and therefore we asked for speech and language pathology to see the patient.  The patient was evaluated with a modified barium swallow on August 14, 2000.  Radiology noted the patient to have cervical osteophytes on the swallowing study.  These result in the bulging of the posterior pharyngeal wall into the pharynx preventing epiglottic deflection to protect the airway and allow food to pass. Speech and language pathology made recommendations for dysphagia I with honeythick liquids with full supervision and strict adherence to precautions. They did not expect any improvements to be made with this current regimen, stating that probably the only elimination of his aspiration risk would be a neurosurgical intervention as it appears that his dysphagia is anatomic.  The  patient lacks insight at this time to proceed with surgery.  #3 - Non-insulin-dependent diabetes mellitus.  The patient is on a diabetic diet and tolerating his glyburide with good control of his blood sugars.  #4 - Macrocytic anemia.  The patient is hemodynamically stable and no further workup was persued.  LABORATORY DATA:  Prior to discharge ammonia level was 23, BUN 12, creatinine 1, potassium 4.   Hemoglobin 10.3, hematocrit 30.1, MCV 101.4, sed rate 39.  DISCHARGE MEDICATIONS: 1. Thiamine 100 mg q.d. 2. Folic acid 1 mg q.d. 3. Iron sulfate 325 mg t.i.d. 4. Toprol XL 50 mg q.d. 5. Multivitamin with minerals q.d. 6. Lactulose 30 ml q.d. 7. Glyburide 5 mg q.d. 8. Protonix 40 mg q.d.  DIET:  The patient has been instructed to follow a diabetic, no concentrated sweet diet.  The patient should be on a pureed diet with liquids thickened to a honey-thick consistency.  Swallowing precaution is as follows, the patient should be sitting up.  He requires full supervision with eating.  He requires supervision to remind him of the following; he needs to swallow two to three times after each bite.  He needs to cough after every third swallow.  He also requires that his medications be crushed and given with yogurt or applesauce. The patient has also been advised not to drink any alcohol.  The patient is to follow up with Gordy Savers, M.D. in two to three weeks. DD:  08/19/00 TD:  08/19/00 Job: [id number]

## 2010-11-15 ENCOUNTER — Ambulatory Visit: Payer: Medicare Other | Admitting: Internal Medicine

## 2010-11-19 ENCOUNTER — Ambulatory Visit: Payer: Medicare Other | Admitting: Internal Medicine

## 2010-11-22 ENCOUNTER — Ambulatory Visit (INDEPENDENT_AMBULATORY_CARE_PROVIDER_SITE_OTHER): Payer: Medicare Other | Admitting: Internal Medicine

## 2010-11-22 ENCOUNTER — Encounter: Payer: Self-pay | Admitting: Internal Medicine

## 2010-11-22 DIAGNOSIS — I10 Essential (primary) hypertension: Secondary | ICD-10-CM

## 2010-11-22 DIAGNOSIS — I4891 Unspecified atrial fibrillation: Secondary | ICD-10-CM

## 2010-11-22 DIAGNOSIS — I2510 Atherosclerotic heart disease of native coronary artery without angina pectoris: Secondary | ICD-10-CM

## 2010-11-22 DIAGNOSIS — E119 Type 2 diabetes mellitus without complications: Secondary | ICD-10-CM

## 2010-11-22 LAB — HEMOGLOBIN A1C: Hgb A1c MFr Bld: 6.8 % — ABNORMAL HIGH (ref 4.6–6.5)

## 2010-11-22 MED ORDER — CLONIDINE HCL 0.1 MG PO TABS: 0.1000 mg | ORAL_TABLET | Freq: Two times a day (BID) | ORAL | Status: DC

## 2010-11-22 NOTE — Progress Notes (Signed)
  Subjective:    Patient ID: Johnny [name], male    DOB: May 23, 1934, 76 y.o.   MRN: [id number]  HPI  76 year old patient who is seen today for followup of his hypertension and type 2 diabetes he has history of alcoholism. He has not been very compliant with his medications but states that he is recently refilled his medications. He generally feels well today denies any chest pain. He does have a history of coronary artery disease. His last hemoglobin A1c was 6.8.    Review of Systems  Constitutional: Negative for fever, chills, appetite change and fatigue.  HENT: Negative for hearing loss, ear pain, congestion, sore throat, trouble swallowing, neck stiffness, dental problem, voice change and tinnitus.   Eyes: Negative for pain, discharge and visual disturbance.  Respiratory: Negative for cough, chest tightness, wheezing and stridor.   Cardiovascular: Negative for chest pain, palpitations and leg swelling.  Gastrointestinal: Negative for nausea, vomiting, abdominal pain, diarrhea, constipation, blood in stool and abdominal distention.  Genitourinary: Negative for urgency, hematuria, flank pain, discharge, difficulty urinating and genital sores.  Musculoskeletal: Negative for myalgias, back pain, joint swelling, arthralgias and gait problem.  Skin: Negative for rash.  Neurological: Negative for dizziness, syncope, speech difficulty, weakness, numbness and headaches.  Hematological: Negative for adenopathy. Does not bruise/bleed easily.  Psychiatric/Behavioral: Negative for behavioral problems and dysphoric mood. The patient is not nervous/anxious.        Objective:   Physical Exam  Constitutional: He is oriented to person, place, and time. He appears well-developed.       Repeat blood pressure 100/70  HENT:  Head: Normocephalic.  Right Ear: External ear normal.  Left Ear: External ear normal.       Poor dentition with numerous missing teeth  Eyes: Conjunctivae and EOM are normal.  Neck:  Normal range of motion.  Cardiovascular: Normal rate, regular rhythm and normal heart sounds.        Rhythm appears regular  Pulmonary/Chest: Effort normal and breath sounds normal. No respiratory distress. He has no wheezes.       Sternotomy scar  Abdominal: Bowel sounds are normal.  Musculoskeletal: Normal range of motion. He exhibits no edema and no tenderness.  Neurological: He is alert and oriented to person, place, and time.  Psychiatric: He has a normal mood and affect. His behavior is normal.          Assessment & Plan:   Hypertension. We'll decrease his Catapres to 0.1 mg twice daily Coronary artery disease stable Type 2 diabetes. Compliance stressed samples were provided. We'll check a hemoglobin A1c Dyslipidemia

## 2010-11-22 NOTE — Patient Instructions (Signed)
Limit your sodium (Salt) intake   Please check your hemoglobin A1c every 3 months   

## 2011-01-17 LAB — APTT: aPTT: 26

## 2011-01-17 LAB — CBC
Hemoglobin: 9.9 — ABNORMAL LOW
MCHC: 33.4
MCV: 97.2
Platelets: 97 — ABNORMAL LOW
RBC: 3.03 — ABNORMAL LOW
RBC: 3.32 — ABNORMAL LOW
RDW: 15.3
WBC: 5.9
WBC: 6
WBC: 7.3

## 2011-01-17 LAB — ETHANOL: Alcohol, Ethyl (B): <5

## 2011-01-17 LAB — DIFFERENTIAL
Eosinophils Absolute: 0
Lymphocytes Relative: 15
Lymphs Abs: 0.9
Monocytes Relative: 10
Neutrophils Relative %: 74

## 2011-01-17 LAB — BASIC METABOLIC PANEL
BUN: 23
CO2: 25
Calcium: 7.5 — ABNORMAL LOW
Calcium: 8 — ABNORMAL LOW
Chloride: 101
Chloride: 105
Creatinine, Ser: 1.19
Creatinine, Ser: 1.83 — ABNORMAL HIGH
Creatinine, Ser: 1.95 — ABNORMAL HIGH
GFR calc Af Amer: 41 — ABNORMAL LOW
GFR calc Af Amer: 44 — ABNORMAL LOW
GFR calc non Af Amer: >60
Glucose, Bld: 153 — ABNORMAL HIGH
Potassium: 4.5
Sodium: 136

## 2011-01-17 LAB — URINALYSIS, ROUTINE W REFLEX MICROSCOPIC
Glucose, UA: NEGATIVE
Hgb urine dipstick: NEGATIVE
Specific Gravity, Urine: 1.015
Urobilinogen, UA: 1

## 2011-01-17 LAB — PROTIME-INR
INR: 0.8
INR: 1.4
INR: 2.4 — ABNORMAL HIGH
INR: 2.5 — ABNORMAL HIGH
INR: 2.7 — ABNORMAL HIGH
INR: 2.7 — ABNORMAL HIGH
INR: 3.2 — ABNORMAL HIGH
Prothrombin Time: 11.5 — ABNORMAL LOW
Prothrombin Time: 25.2 — ABNORMAL HIGH
Prothrombin Time: 28 — ABNORMAL HIGH
Prothrombin Time: 30.2 — ABNORMAL HIGH
Prothrombin Time: 35.1 — ABNORMAL HIGH

## 2011-01-17 LAB — HEPATIC FUNCTION PANEL
AST: 24
Albumin: 3.9
Total Bilirubin: 1.1
Total Protein: 7.8

## 2011-01-17 LAB — POCT CARDIAC MARKERS
CKMB, poc: 2.6
Myoglobin, poc: 141
Troponin i, poc: <0.05

## 2011-02-27 ENCOUNTER — Ambulatory Visit: Payer: Medicare Other | Admitting: Internal Medicine

## 2011-04-28 ENCOUNTER — Inpatient Hospital Stay (HOSPITAL_COMMUNITY)
Admission: EM | Admit: 2011-04-28 | Discharge: 2011-05-03 | DRG: 556 | Disposition: A | Discharge status: Home / Self Care | Payer: Medicare PPO | Source: Ambulatory Visit | Attending: Family Medicine | Admitting: Family Medicine

## 2011-04-28 ENCOUNTER — Other Ambulatory Visit: Payer: Self-pay

## 2011-04-28 ENCOUNTER — Encounter (HOSPITAL_COMMUNITY): Payer: Self-pay

## 2011-04-28 ENCOUNTER — Emergency Department (HOSPITAL_COMMUNITY): Payer: Medicare PPO

## 2011-04-28 DIAGNOSIS — F1010 Alcohol abuse, uncomplicated: Secondary | ICD-10-CM | POA: Diagnosis present

## 2011-04-28 DIAGNOSIS — Z87891 Personal history of nicotine dependence: Secondary | ICD-10-CM

## 2011-04-28 DIAGNOSIS — E1151 Type 2 diabetes mellitus with diabetic peripheral angiopathy without gangrene: Secondary | ICD-10-CM | POA: Diagnosis present

## 2011-04-28 DIAGNOSIS — Z882 Allergy status to sulfonamides status: Secondary | ICD-10-CM

## 2011-04-28 DIAGNOSIS — R29898 Other symptoms and signs involving the musculoskeletal system: Secondary | ICD-10-CM | POA: Diagnosis present

## 2011-04-28 DIAGNOSIS — E785 Hyperlipidemia, unspecified: Secondary | ICD-10-CM | POA: Diagnosis present

## 2011-04-28 DIAGNOSIS — I2510 Atherosclerotic heart disease of native coronary artery without angina pectoris: Secondary | ICD-10-CM | POA: Diagnosis present

## 2011-04-28 DIAGNOSIS — I10 Essential (primary) hypertension: Secondary | ICD-10-CM | POA: Diagnosis present

## 2011-04-28 DIAGNOSIS — Z951 Presence of aortocoronary bypass graft: Secondary | ICD-10-CM

## 2011-04-28 DIAGNOSIS — M25559 Pain in unspecified hip: Secondary | ICD-10-CM | POA: Diagnosis present

## 2011-04-28 DIAGNOSIS — Z794 Long term (current) use of insulin: Secondary | ICD-10-CM

## 2011-04-28 DIAGNOSIS — Z8249 Family history of ischemic heart disease and other diseases of the circulatory system: Secondary | ICD-10-CM

## 2011-04-28 DIAGNOSIS — I4891 Unspecified atrial fibrillation: Secondary | ICD-10-CM | POA: Diagnosis present

## 2011-04-28 DIAGNOSIS — Z7982 Long term (current) use of aspirin: Secondary | ICD-10-CM

## 2011-04-28 DIAGNOSIS — Z833 Family history of diabetes mellitus: Secondary | ICD-10-CM

## 2011-04-28 DIAGNOSIS — Z79899 Other long term (current) drug therapy: Secondary | ICD-10-CM

## 2011-04-28 DIAGNOSIS — G250 Essential tremor: Secondary | ICD-10-CM | POA: Diagnosis present

## 2011-04-28 DIAGNOSIS — R000 Tachycardia, unspecified: Secondary | ICD-10-CM | POA: Diagnosis present

## 2011-04-28 DIAGNOSIS — E119 Type 2 diabetes mellitus without complications: Secondary | ICD-10-CM | POA: Diagnosis present

## 2011-04-28 DIAGNOSIS — I480 Paroxysmal atrial fibrillation: Secondary | ICD-10-CM | POA: Diagnosis present

## 2011-04-28 LAB — COMPREHENSIVE METABOLIC PANEL
ALT: 17 U/L (ref 0–53)
AST: 33 U/L (ref 0–37)
Alkaline Phosphatase: 166 U/L — ABNORMAL HIGH (ref 39–117)
Calcium: 9.5 mg/dL (ref 8.4–10.5)
Glucose, Bld: 140 mg/dL — ABNORMAL HIGH (ref 70–99)
Potassium: 3.8 mEq/L (ref 3.5–5.1)
Sodium: 142 mEq/L (ref 135–145)
Total Protein: 7.5 g/dL (ref 6.0–8.3)

## 2011-04-28 LAB — URINE MICROSCOPIC-ADD ON

## 2011-04-28 LAB — URINALYSIS, ROUTINE W REFLEX MICROSCOPIC
Glucose, UA: NEGATIVE mg/dL
Leukocytes, UA: NEGATIVE
Specific Gravity, Urine: 1.012 (ref 1.005–1.030)
pH: 5.5 (ref 5.0–8.0)

## 2011-04-28 LAB — DIFFERENTIAL
Basophils Absolute: 0 10*3/uL (ref 0.0–0.1)
Eosinophils Absolute: 0.1 10*3/uL (ref 0.0–0.7)
Eosinophils Relative: 2 % (ref 0–5)
Lymphocytes Relative: 43 % (ref 12–46)
Lymphs Abs: 2.4 10*3/uL (ref 0.7–4.0)
Neutrophils Relative %: 45 % (ref 43–77)

## 2011-04-28 LAB — CBC
MCH: 34 pg (ref 26.0–34.0)
MCV: 95.5 fL (ref 78.0–100.0)
Platelets: 148 10*3/uL — ABNORMAL LOW (ref 150–400)
RBC: 3.56 MIL/uL — ABNORMAL LOW (ref 4.22–5.81)
RDW: 14 % (ref 11.5–15.5)
WBC: 5.6 10*3/uL (ref 4.0–10.5)

## 2011-04-28 LAB — D-DIMER, QUANTITATIVE: D-Dimer, Quant: 1.77 ug/mL-FEU — ABNORMAL HIGH (ref 0.00–0.48)

## 2011-04-28 MED ORDER — SODIUM CHLORIDE 0.9 % IV BOLUS (SEPSIS)
500.0000 mL | Freq: Once | INTRAVENOUS | Status: AC
  Administered 2011-04-28: 500 mL via INTRAVENOUS

## 2011-04-28 MED ORDER — METOPROLOL TARTRATE 25 MG PO TABS
50.0000 mg | ORAL_TABLET | Freq: Once | ORAL | Status: AC
  Administered 2011-04-28: 50 mg via ORAL
  Filled 2011-04-28: qty 2

## 2011-04-28 MED ORDER — LORAZEPAM 1 MG PO TABS
1.0000 mg | ORAL_TABLET | Freq: Once | ORAL | Status: AC
  Administered 2011-04-28: 1 mg via ORAL
  Filled 2011-04-28: qty 1

## 2011-04-28 MED ORDER — ONDANSETRON HCL 4 MG/2ML IJ SOLN
4.0000 mg | Freq: Once | INTRAMUSCULAR | Status: AC
  Administered 2011-04-28: 4 mg via INTRAVENOUS
  Filled 2011-04-28: qty 2

## 2011-04-28 NOTE — ED Notes (Signed)
2029-01 Ready

## 2011-04-28 NOTE — ED Notes (Signed)
Repeat EKG done at 22:21
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
## 2011-04-28 NOTE — ED Notes (Signed)
CBG 138

## 2011-04-28 NOTE — ED Notes (Signed)
Old and new EKG given to MD, copies placed in chart. 

## 2011-04-28 NOTE — ED Notes (Signed)
Patient presents with bilateral hip pain x 3 weeks, 4/10, ETOH on board, no recent injury or deformity.  Patient's last drink of ETOH was today, Vodka. Alert and oriented.

## 2011-04-28 NOTE — ED Notes (Signed)
Attempted IV x 2, unsuccessful, will ask another RN or call IV team.  MD aware.

## 2011-04-28 NOTE — ED Notes (Signed)
Patient reporting right hip pain x 3 days without recent fall or injury. No deformities, abrasions or lacerations noted, patient moves around in bed with mild difficulty.  Patient alert and oriented but seems intoxicated. Patient resting quietly, no distress noted, strong pedal pulses present, patient also appears dehydrated.
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
## 2011-04-29 ENCOUNTER — Encounter (HOSPITAL_COMMUNITY): Payer: Self-pay | Admitting: Emergency Medicine

## 2011-04-29 LAB — CARDIAC PANEL(CRET KIN+CKTOT+MB+TROPI)
CK, MB: 3.4 ng/mL (ref 0.3–4.0)
CK, MB: 3.1 ng/mL (ref 0.3–4.0)
Relative Index: 1.7 (ref 0.0–2.5)
Total CK: 186 U/L (ref 7–232)
Total CK: 225 U/L (ref 7–232)

## 2011-04-29 LAB — COMPREHENSIVE METABOLIC PANEL
ALT: 15 U/L (ref 0–53)
Albumin: 3.3 g/dL — ABNORMAL LOW (ref 3.5–5.2)
Alkaline Phosphatase: 154 U/L — ABNORMAL HIGH (ref 39–117)
BUN: 8 mg/dL (ref 6–23)
Chloride: 100 mEq/L (ref 96–112)
GFR calc Af Amer: >90 mL/min (ref >90)
Glucose, Bld: 184 mg/dL — ABNORMAL HIGH (ref 70–99)
Potassium: 3.4 mEq/L — ABNORMAL LOW (ref 3.5–5.1)
Sodium: 139 mEq/L (ref 135–145)
Total Bilirubin: 0.7 mg/dL (ref 0.3–1.2)
Total Protein: 7.7 g/dL (ref 6.0–8.3)

## 2011-04-29 LAB — CBC
HCT: 32.5 % — ABNORMAL LOW (ref 39.0–52.0)
MCHC: 33.8 g/dL (ref 30.0–36.0)
MCV: 96.4 fL (ref 78.0–100.0)
Platelets: 125 10*3/uL — ABNORMAL LOW (ref 150–400)
RDW: 14.2 % (ref 11.5–15.5)

## 2011-04-29 LAB — GLUCOSE, CAPILLARY
Glucose-Capillary: 192 mg/dL — ABNORMAL HIGH (ref 70–99)
Glucose-Capillary: 165 mg/dL — ABNORMAL HIGH (ref 70–99)

## 2011-04-29 MED ORDER — AMLODIPINE BESYLATE 10 MG PO TABS
10.0000 mg | ORAL_TABLET | Freq: Every day | ORAL | Status: DC
  Administered 2011-04-29 – 2011-05-03 (×5): 10 mg via ORAL
  Filled 2011-04-29 (×5): qty 1

## 2011-04-29 MED ORDER — PROMETHAZINE HCL 25 MG/ML IJ SOLN: 12.5000 mg | INTRAMUSCULAR | Status: DC | PRN

## 2011-04-29 MED ORDER — PANTOPRAZOLE SODIUM 40 MG PO TBEC
40.0000 mg | DELAYED_RELEASE_TABLET | Freq: Every day | ORAL | Status: DC
  Administered 2011-04-29 – 2011-05-03 (×5): 40 mg via ORAL
  Filled 2011-04-29 (×5): qty 1

## 2011-04-29 MED ORDER — ASPIRIN 81 MG PO CHEW
81.0000 mg | CHEWABLE_TABLET | Freq: Every day | ORAL | Status: DC
  Administered 2011-04-29 – 2011-05-03 (×5): 81 mg via ORAL
  Filled 2011-04-29 (×6): qty 1

## 2011-04-29 MED ORDER — LORAZEPAM 1 MG PO TABS
1.0000 mg | ORAL_TABLET | Freq: Four times a day (QID) | ORAL | Status: AC | PRN
  Filled 2011-04-29 (×4): qty 1

## 2011-04-29 MED ORDER — CLONIDINE HCL 0.3 MG PO TABS
0.3000 mg | ORAL_TABLET | Freq: Two times a day (BID) | ORAL | Status: DC
  Administered 2011-04-29 – 2011-05-03 (×8): 0.3 mg via ORAL
  Filled 2011-04-29 (×10): qty 1

## 2011-04-29 MED ORDER — ACETAMINOPHEN 325 MG PO TABS: 650.0000 mg | ORAL_TABLET | Freq: Four times a day (QID) | ORAL | Status: DC | PRN

## 2011-04-29 MED ORDER — VITAMIN B-1 100 MG PO TABS
100.0000 mg | ORAL_TABLET | Freq: Every day | ORAL | Status: DC
  Administered 2011-04-29 – 2011-05-03 (×5): 100 mg via ORAL
  Filled 2011-04-29 (×5): qty 1

## 2011-04-29 MED ORDER — FOLIC ACID 1 MG PO TABS
1.0000 mg | ORAL_TABLET | Freq: Every day | ORAL | Status: DC
  Filled 2011-04-29: qty 1

## 2011-04-29 MED ORDER — LORAZEPAM 1 MG PO TABS
0.0000 mg | ORAL_TABLET | Freq: Four times a day (QID) | ORAL | Status: AC
  Administered 2011-04-29 – 2011-04-30 (×6): 1 mg via ORAL
  Filled 2011-04-29 (×5): qty 1

## 2011-04-29 MED ORDER — THIAMINE HCL 100 MG/ML IJ SOLN
Freq: Once | INTRAVENOUS | Status: AC
  Administered 2011-04-29: 02:00:00 via INTRAVENOUS
  Filled 2011-04-29 (×2): qty 1000

## 2011-04-29 MED ORDER — ADULT MULTIVITAMIN W/MINERALS CH
1.0000 | ORAL_TABLET | Freq: Every day | ORAL | Status: DC
  Administered 2011-04-29 – 2011-05-03 (×5): 1 via ORAL
  Filled 2011-04-29 (×5): qty 1

## 2011-04-29 MED ORDER — METOPROLOL TARTRATE 50 MG PO TABS
50.0000 mg | ORAL_TABLET | Freq: Two times a day (BID) | ORAL | Status: DC
  Administered 2011-04-29 – 2011-04-30 (×4): 50 mg via ORAL
  Filled 2011-04-29 (×6): qty 1

## 2011-04-29 MED ORDER — FOLIC ACID 1 MG PO TABS
1.0000 mg | ORAL_TABLET | Freq: Every day | ORAL | Status: DC
  Administered 2011-04-29 – 2011-05-03 (×6): 1 mg via ORAL
  Filled 2011-04-29 (×5): qty 1

## 2011-04-29 MED ORDER — LORAZEPAM 1 MG PO TABS
0.0000 mg | ORAL_TABLET | Freq: Two times a day (BID) | ORAL | Status: AC
  Administered 2011-05-01: 1 mg via ORAL

## 2011-04-29 MED ORDER — INFLUENZA VIRUS VACC SPLIT PF IM SUSP
0.5000 mL | INTRAMUSCULAR | Status: AC
  Administered 2011-04-30: 0.5 mL via INTRAMUSCULAR
  Filled 2011-04-29: qty 0.5

## 2011-04-29 MED ORDER — ONDANSETRON HCL 4 MG/2ML IJ SOLN
4.0000 mg | Freq: Four times a day (QID) | INTRAMUSCULAR | Status: DC | PRN
  Administered 2011-04-29: 4 mg via INTRAVENOUS
  Filled 2011-04-29: qty 2

## 2011-04-29 MED ORDER — INSULIN GLARGINE 100 UNIT/ML ~~LOC~~ SOLN
15.0000 [IU] | Freq: Every day | SUBCUTANEOUS | Status: DC
  Administered 2011-04-29 – 2011-05-02 (×4): 15 [IU] via SUBCUTANEOUS
  Filled 2011-04-29: qty 3

## 2011-04-29 MED ORDER — INSULIN ASPART 100 UNIT/ML ~~LOC~~ SOLN
0.0000 [IU] | Freq: Three times a day (TID) | SUBCUTANEOUS | Status: DC
  Administered 2011-04-29 (×3): 2 [IU] via SUBCUTANEOUS
  Administered 2011-04-30: 3 [IU] via SUBCUTANEOUS
  Administered 2011-04-30: 2 [IU] via SUBCUTANEOUS
  Administered 2011-05-01: 3 [IU] via SUBCUTANEOUS
  Administered 2011-05-01: 2 [IU] via SUBCUTANEOUS
  Administered 2011-05-01 – 2011-05-02 (×2): 1 [IU] via SUBCUTANEOUS
  Administered 2011-05-02: 3 [IU] via SUBCUTANEOUS
  Administered 2011-05-02 – 2011-05-03 (×2): 2 [IU] via SUBCUTANEOUS
  Administered 2011-05-03: 3 [IU] via SUBCUTANEOUS
  Filled 2011-04-29: qty 3

## 2011-04-29 MED ORDER — ACETAMINOPHEN 650 MG RE SUPP: 650.0000 mg | Freq: Four times a day (QID) | RECTAL | Status: DC | PRN

## 2011-04-29 MED ORDER — CARBIDOPA-LEVODOPA CR 25-100 MG PO TBCR
1.0000 | EXTENDED_RELEASE_TABLET | Freq: Two times a day (BID) | ORAL | Status: DC
  Administered 2011-04-30 – 2011-05-03 (×8): 1 via ORAL
  Filled 2011-04-29 (×10): qty 1

## 2011-04-29 MED ORDER — HYDRALAZINE HCL 20 MG/ML IJ SOLN
5.0000 mg | INTRAMUSCULAR | Status: DC | PRN
  Filled 2011-04-29: qty 0.25

## 2011-04-29 MED ORDER — LABETALOL HCL 5 MG/ML IV SOLN
10.0000 mg | INTRAVENOUS | Status: DC | PRN
  Administered 2011-04-29: 10 mg via INTRAVENOUS
  Filled 2011-04-29: qty 4

## 2011-04-29 MED ORDER — LORAZEPAM 2 MG/ML IJ SOLN
1.0000 mg | Freq: Four times a day (QID) | INTRAMUSCULAR | Status: AC | PRN
  Administered 2011-04-29: 1 mg via INTRAVENOUS
  Filled 2011-04-29: qty 1

## 2011-04-29 MED ORDER — ONDANSETRON HCL 4 MG PO TABS: 4.0000 mg | ORAL_TABLET | Freq: Four times a day (QID) | ORAL | Status: DC | PRN

## 2011-04-29 MED ORDER — CLONIDINE HCL 0.1 MG PO TABS
0.1000 mg | ORAL_TABLET | Freq: Two times a day (BID) | ORAL | Status: DC
  Administered 2011-04-29 (×2): 0.1 mg via ORAL
  Filled 2011-04-29 (×4): qty 1

## 2011-04-29 MED ORDER — THIAMINE HCL 100 MG PO TABS
100.0000 mg | ORAL_TABLET | Freq: Every day | ORAL | Status: DC
  Filled 2011-04-29: qty 1

## 2011-04-29 MED ORDER — THIAMINE HCL 100 MG/ML IJ SOLN
100.0000 mg | Freq: Every day | INTRAMUSCULAR | Status: DC
  Administered 2011-04-29 – 2011-04-30 (×2): 100 mg via INTRAVENOUS
  Filled 2011-04-29 (×4): qty 1

## 2011-04-29 MED ORDER — HYDRALAZINE HCL 25 MG PO TABS
25.0000 mg | ORAL_TABLET | Freq: Three times a day (TID) | ORAL | Status: DC
  Administered 2011-04-29 – 2011-05-03 (×13): 25 mg via ORAL
  Filled 2011-04-29 (×15): qty 1

## 2011-04-29 MED ORDER — METOPROLOL TARTRATE 50 MG PO TABS
50.0000 mg | ORAL_TABLET | Freq: Two times a day (BID) | ORAL | Status: DC
  Filled 2011-04-29 (×2): qty 1

## 2011-04-29 NOTE — ED Provider Notes (Signed)
I have seen and examined this patient with the resident.  I agree with the resident's note, assessment and plan except as indicated.     Nat Christen, MD 04/29/11 [phone]

## 2011-04-29 NOTE — Progress Notes (Signed)
Utilization review completed. Johnny [name] 04/29/2011
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
## 2011-04-30 ENCOUNTER — Inpatient Hospital Stay (HOSPITAL_COMMUNITY): Payer: Medicare PPO

## 2011-04-30 LAB — CBC
Platelets: 121 10*3/uL — ABNORMAL LOW (ref 150–400)
RBC: 3.62 MIL/uL — ABNORMAL LOW (ref 4.22–5.81)
RDW: 14 % (ref 11.5–15.5)
WBC: 7.1 10*3/uL (ref 4.0–10.5)

## 2011-04-30 LAB — GLUCOSE, CAPILLARY
Glucose-Capillary: 108 mg/dL — ABNORMAL HIGH (ref 70–99)
Glucose-Capillary: 224 mg/dL — ABNORMAL HIGH (ref 70–99)
Glucose-Capillary: 152 mg/dL — ABNORMAL HIGH (ref 70–99)

## 2011-04-30 LAB — COMPREHENSIVE METABOLIC PANEL
ALT: 13 U/L (ref 0–53)
AST: 24 U/L (ref 0–37)
Albumin: 3.3 g/dL — ABNORMAL LOW (ref 3.5–5.2)
Alkaline Phosphatase: 143 U/L — ABNORMAL HIGH (ref 39–117)
Chloride: 95 mEq/L — ABNORMAL LOW (ref 96–112)
Creatinine, Ser: 0.94 mg/dL (ref 0.50–1.35)
Potassium: 3.2 mEq/L — ABNORMAL LOW (ref 3.5–5.1)
Sodium: 137 mEq/L (ref 135–145)
Total Bilirubin: 1 mg/dL (ref 0.3–1.2)

## 2011-04-30 LAB — T3, FREE: T3, Free: 4 pg/mL (ref 2.3–4.2)

## 2011-04-30 MED ORDER — IOHEXOL 300 MG/ML  SOLN
100.0000 mL | Freq: Once | INTRAMUSCULAR | Status: AC | PRN
  Administered 2011-04-30: 100 mL via INTRAVENOUS

## 2011-04-30 MED ORDER — METOPROLOL TARTRATE 100 MG PO TABS
100.0000 mg | ORAL_TABLET | Freq: Two times a day (BID) | ORAL | Status: DC
  Administered 2011-04-30 – 2011-05-03 (×6): 100 mg via ORAL
  Filled 2011-04-30 (×7): qty 1

## 2011-04-30 MED ORDER — LABETALOL HCL 5 MG/ML IV SOLN
5.0000 mg | Freq: Once | INTRAVENOUS | Status: AC
Start: 2011-04-30 — End: 2011-04-30
  Administered 2011-04-30: 5 mg via INTRAVENOUS
  Filled 2011-04-30 (×2): qty 4
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
## 2011-04-30 NOTE — Progress Notes (Signed)
Occupational Therapy Evaluation Patient Details Name: Johnny [name] MRN: [id number] DOB: 1934/11/23 Today's Date: 04/30/2011  Problem List:  Patient Active Problem List  Diagnoses  . DIABETES MELLITUS, TYPE II  . ANEMIA OF OTHER CHRONIC DISEASE  . HYPERTENSION  . CORONARY ARTERY DISEASE  . Atrial fibrillation  . ACUTE ALCOHOLIC HEPATITIS  . Tachycardia  . Lower extremity weakness    Past Medical History:  Past Medical History  Diagnosis Date  . Acute alcoholic hepatitis 05/20/2010  . Anemia of other chronic disease 08/16/2007  . Atrial fibrillation 08/12/2007  . CORONARY ARTERY DISEASE 12/11/2006  . DIABETES MELLITUS, TYPE II 12/11/2006  . HYPERTENSION 12/11/2006  . Hyperlipidemia   . ETOH abuse   . Chronic low back pain    Past Surgical History:  Past Surgical History  Procedure Date  . Coronary artery bypass graft     OT Assessment/Plan/Recommendation OT Assessment Clinical Impression Statement: Patient admitted with non-traumatic hip pain and difficulty ambulating. Presents with weakness and no c/o hip pain. Will benefit from skilled OT in the acute setting to maximize I with ADL and ADL mobility to level set in goals below so as to facilitate safe d/c home with nephews. Patient will require 24 supervision/assistance upon d/c- if not available, will require ST-SNF for rehab. OT Recommendation/Assessment: Patient will need skilled OT in the acute care venue OT Problem List: Decreased strength;Decreased activity tolerance;Impaired balance (sitting and/or standing);Decreased safety awareness;Decreased knowledge of use of DME or AE;Decreased knowledge of precautions OT Therapy Diagnosis : Generalized weakness OT Plan OT Frequency: Min 2X/week OT Treatment/Interventions: Self-care/ADL training;Therapeutic exercise;DME and/or AE instruction;Therapeutic activities;Patient/family education;Balance training OT Recommendation Follow Up Recommendations: Supervision/Assistance - 24  hour;Home health OT (SNF if 24 hour not available) Equipment Recommended: None recommended by OT Individuals Consulted Consulted and Agree with Results and Recommendations: Patient OT Goals Acute Rehab OT Goals OT Goal Formulation: With patient Time For Goal Achievement: 7 days ADL Goals Pt Will Perform Grooming: with supervision;Standing at sink ADL Goal: Grooming - Progress: Not met Pt Will Perform Upper Body Dressing: with supervision;with set-up;Sitting, bed ADL Goal: Upper Body Dressing - Progress: Not met Pt Will Perform Lower Body Dressing: with min assist;Sit to stand from bed ADL Goal: Lower Body Dressing - Progress: Not met Pt Will Transfer to Toilet: with supervision;Ambulation;with DME;3-in-1 ADL Goal: Toilet Transfer - Progress: Not met Pt Will Perform Toileting - Hygiene: with supervision;Sit to stand from 3-in-1/toilet ADL Goal: Toileting - Hygiene - Progress: Not met  OT Evaluation Precautions/Restrictions  Precautions Precautions: Fall Restrictions Weight Bearing Restrictions: No Prior Functioning Home Living Lives With: Family (2 nephews) Receives Help From: Family Type of Home: House Home Layout: One level Home Access: Stairs to enter Entrance Stairs-Rails:  (Unclear) Entrance Stairs-Number of Steps:  (Unclear) Bathroom Shower/Tub: Engineer, manufacturing systems: Standard Home Adaptive Equipment: Shower chair with back;Walker - rolling Additional Comments: Unclear home set-up and pt's PLOF secondary pt's confusion.   Prior Function Level of Independence: Independent with gait;Independent with transfers;Requires assistive device for independence;Needs assistance with ADLs;Needs assistance with homemaking ADL ADL Eating/Feeding: Performed;Independent Where Assessed - Eating/Feeding: Edge of bed Grooming: Performed;Wash/dry hands;Minimal assistance Where Assessed - Grooming: Standing at sink Upper Body Bathing: Not assessed Lower Body Bathing: Not  assessed Upper Body Dressing: Minimal assistance;Performed Upper Body Dressing Details (indicate cue type and reason): gown Where Assessed - Upper Body Dressing: Sitting, bed Lower Body Dressing: Performed;Moderate assistance Where Assessed - Lower Body Dressing: Sit to stand from chair Toilet Transfer: Performed;Minimal assistance  Toilet Transfer Method: Proofreader: Raised toilet seat with arms (or 3-in-1 over toilet) Toileting - Clothing Manipulation: Not assessed Toileting - Hygiene: Performed;+1 Total assistance Where Assessed - Toileting Hygiene: Standing Tub/Shower Transfer: Not assessed Equipment Used: Rolling walker Ambulation Related to ADLs: Min (A) with RW. Assist to navigate around objects and for safety. Cognition Cognition Orientation Level: Oriented to person;Oriented to place;Disoriented to time;Disoriented to situation Extremity Assessment RUE Strength RUE Overall Strength Comments: 4/5 LUE Strength LUE Overall Strength Comments: 4/5 Mobility  Bed Mobility Bed Mobility: Yes Sit to Supine: 4: Min assist Sit to Supine - Details (indicate cue type and reason): A with LEs and cueing for positioning in bed.  Transfers Sit to Stand: 4: Min assist;With upper extremity assist;From chair/3-in-1 Sit to Stand Details (indicate cue type and reason): cues for UE use, anterior wt shift, attention to task.   Stand to Sit: 4: Min assist;With upper extremity assist;To bed Stand to Sit Details: cues for UE use, control descent, and to get closer to bed and towards [facility] prior to sitting.   End of Session OT - End of Session Equipment Utilized During Treatment: Gait belt Activity Tolerance: Patient limited by fatigue Patient left: in bed;with call bell in reach Nurse Communication: Mobility status for transfers;Mobility status for ambulation General Behavior During Session: [facility] for tasks performed Cognition: Impaired   Jamariyah Johannsen 04/30/2011, 2:12 PM

## 2011-04-30 NOTE — Progress Notes (Signed)
                                       [facility] PROGRESS NOTE    SUBJECTIVE   Patient has no complaints.  Nurse states he seems to be able to move better today.  On exam he has less rigidity and less postural tremor.   OBJECTIVE   Vital signs in last 24 hours: Temp:  [98.1 F (36.7 C)] 98.1 F (36.7 C) (01/09 0637) Pulse Rate:  [94-137] 98  (01/09 0700) Resp:  [18-20] 18  (01/09 0700) BP: (162-215)/(62-110) 198/79 mmHg (01/09 0700) SpO2:  [93 %-100 %] 100 % (01/09 0700)  Intake/Output from previous day: 01/08 0701 - 01/09 0700 In: 240 [P.O.:240] Out: 1026 [Urine:1025; Emesis/NG output:1] Intake/Output this shift: Total I/O In: -  Out: 700 [Urine:700] Nutritional status: Carb Control  Past Medical History  Diagnosis Date  . Acute alcoholic hepatitis 05/20/2010  . Anemia of other chronic disease 08/16/2007  . Atrial fibrillation 08/12/2007  . CORONARY ARTERY DISEASE 12/11/2006  . DIABETES MELLITUS, TYPE II 12/11/2006  . HYPERTENSION 12/11/2006  . Hyperlipidemia   . ETOH abuse   . Chronic low back pain     Neurologic Exam:   Mental Status: Alert,  Speech fluent without evidence of aphasia. Able to follow 3 step commands without difficulty. Cranial Nerves: Motor: 5/5 bilaterally continues to have increased tone through out but much less than yesterdays exam.  I also noted his postural tremor is less than yesterday.  PT is to do EVAL today on gait.    Deep Tendon Reflexes: 2+ and symmetric throughout Plantars: Downgoing bilaterally    Medications:     Scheduled:   . amLODipine  10 mg Oral Daily  . aspirin  81 mg Oral Daily  . carbidopa-levodopa  1 tablet Oral BID  . cloNIDine  0.3 mg Oral BID  . folic acid  1 mg Oral Daily  . hydrALAZINE  25 mg Oral Q8H  . influenza  inactive virus vaccine  0.5 mL Intramuscular Tomorrow-1000  . insulin aspart  0-9 Units Subcutaneous TID WC  . insulin glargine  15 Units Subcutaneous QHS  . labetalol  5 mg  Intravenous Once  . LORazepam  0-4 mg Oral Q6H   Followed by  . LORazepam  0-4 mg Oral Q12H  . metoprolol  50 mg Oral BID  . mulitivitamin with minerals  1 tablet Oral Daily  . pantoprazole  40 mg Oral Daily  . thiamine  100 mg Oral Daily   Or  . thiamine  100 mg Intravenous Daily  . DISCONTD: cloNIDine  0.1 mg Oral BID    Assessment/Plan:    77 YO male with long history gait difficulty. Resting tremor, increased tone and masked facies consistent with PD.  Trial of Sinemet initiated today and patient appears to have less tremor, decreased tone.  No issues with orthostatic BP.    Recommend:  1) continue with current dose of sinemet.   2) Patient needs to F/U out patient with neurology.   Felicie Morn PA-C [facility] [phone]  04/30/2011, 10:49 AM
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
## 2011-05-01 ENCOUNTER — Other Ambulatory Visit: Payer: Self-pay

## 2011-05-01 LAB — BASIC METABOLIC PANEL
BUN: 22 mg/dL (ref 6–23)
Chloride: 94 mEq/L — ABNORMAL LOW (ref 96–112)
Creatinine, Ser: 1.2 mg/dL (ref 0.50–1.35)
GFR calc Af Amer: 67 mL/min — ABNORMAL LOW (ref >90)
GFR calc non Af Amer: 58 mL/min — ABNORMAL LOW (ref >90)

## 2011-05-01 LAB — GLUCOSE, CAPILLARY
Glucose-Capillary: 135 mg/dL — ABNORMAL HIGH (ref 70–99)
Glucose-Capillary: 188 mg/dL — ABNORMAL HIGH (ref 70–99)

## 2011-05-01 LAB — MAGNESIUM: Magnesium: 1.1 mg/dL — ABNORMAL LOW (ref 1.5–2.5)

## 2011-05-01 MED ORDER — MAGNESIUM SULFATE 40 MG/ML IJ SOLN
2.0000 g | Freq: Once | INTRAMUSCULAR | Status: AC
  Administered 2011-05-01: 2 g via INTRAVENOUS
  Filled 2011-05-01: qty 50
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
## 2011-05-01 NOTE — Progress Notes (Signed)
Dr. Cena Benton notified of labs from yesterday, Stat labs ordered for today.

## 2011-05-01 NOTE — Progress Notes (Signed)
Occupational Therapy Treatment Patient Details Name: TEVIS DUNAVAN MRN: [id number] DOB: November 19, 1934 Today's Date: 05/01/2011  OT Assessment/Plan OT Assessment/Plan OT Plan: Discharge plan remains appropriate Follow Up Recommendations: Supervision/Assistance - 24 hour;Home health OT Equipment Recommended: None recommended by OT OT Goals ADL Goals ADL Goal: Grooming - Progress: Progressing toward goals ADL Goal: Lower Body Dressing - Progress: Progressing toward goals ADL Goal: Toilet Transfer - Progress: Progressing toward goals  OT Treatment Precautions/Restrictions  Precautions Precautions: Fall Restrictions Weight Bearing Restrictions: No   ADL ADL Grooming: Performed;Wash/dry hands;Minimal assistance Grooming Details (indicate cue type and reason): pt with posterior LOB standing at sink x 2- able to catch self and continue task Where Assessed - Grooming: Standing at sink Lower Body Dressing: Performed;Supervision/safety Lower Body Dressing Details (indicate cue type and reason): able to don socks by leaning back against chair back and crossing ankles over knees. would not be able to do this without support Where Assessed - Lower Body Dressing: Sitting, chair Toilet Transfer: Simulated;Minimal assistance Toilet Transfer Details (indicate cue type and reason): min assist to navigate RW and VC to stay within walker. patient with kyphotic posture (states this is baseline). simulated EOB to chair. Toilet Transfer Method: Ambulating Equipment Used: Rolling walker ADL Comments: Patient required Min-Mod assist for sit to stand from chair with armrests.  Mobility  Bed Mobility Sit to Supine: 5: Supervision;With rail;HOB flat  End of Session OT - End of Session Equipment Utilized During Treatment: Gait belt Patient left: in chair;with call bell in reach (with lunch tray set up) General Behavior During Session: [facility] for tasks performed Cognition: Impaired  Dotsie Gillette  05/01/2011,  12:24 PM

## 2011-05-01 NOTE — Progress Notes (Signed)
Telemetry shows ?run VT per monitor Rn to room, pt alert  Resting in bed bp 141/75 room air sat 98,12 lead EKG   sinus rhythm, will notify MD

## 2011-05-01 NOTE — Progress Notes (Signed)
   CARE MANAGEMENT NOTE 05/01/2011  Patient:  Johnny [name], Johnny [name]   Account Number:  [id number]  Date Initiated:  04/30/2011  Documentation initiated by:  [facility]  Subjective/Objective Assessment:   Admitted with hip pain, tachycardia.Lives with 2  nephews, has 2 daughters. Has rolling walker and cane. Had had [facility] in the past.     Action/Plan:   PT eval- recommending HHPT with 24h supervision   Anticipated DC Date:  05/01/2011   Anticipated DC Plan:  HOME W HOME HEALTH SERVICES      DC Planning Services  CM consult      Choice offered to / List presented to:  C-4 Adult Children        HH arranged  HH-1 RN  HH-2 PT  HH-3 OT      [facility] agency  [facility]   Status of service:  In process, will continue to follow Medicare Important Message given?   (If response is "NO", the following Medicare IM given date fields will be blank) Date Medicare IM given:   Date Additional Medicare IM given:    Discharge Disposition:    Per UR Regulation:  Reviewed for med. necessity/level of care/duration of stay  Comments:  PCP Dr. Lesia Hausen  05/01/11 Spoke with patient's daughter Arby Barrette [phone], she wanted to have [facility] again for HHRN,PT, and OT. She confirmed that the patient would have 24h supervision. Contacted Debbie at [facility], they no longer work with [employer]. Contacted Ms Lilia Pro again and she selected [facility]. Contacted Gudelia Eugene at [facility] and set up [facility], PT and OT. They will contact the patient to schedule first visit after d/c. Jacquelynn Cree RN, BSN, [facility]  04/30/11 Spoke with patient about d/c plans. He states that he has two nephews who live with him and he would have 24h supervision. He does not want to go to a facoility. He has had [facility] in the past but can not remember the agency. He would like me to speak with his daughter on 05/01/11 about [facility] agencies. Will cont to follow. Jacquelynn Cree RN, BSN, CCM

## 2011-05-02 DIAGNOSIS — I369 Nonrheumatic tricuspid valve disorder, unspecified: Secondary | ICD-10-CM

## 2011-05-02 LAB — MAGNESIUM: Magnesium: 1.5 mg/dL (ref 1.5–2.5)

## 2011-05-02 LAB — BASIC METABOLIC PANEL
BUN: 23 mg/dL (ref 6–23)
CO2: 27 mEq/L (ref 19–32)
Calcium: 8.9 mg/dL (ref 8.4–10.5)
Chloride: 98 mEq/L (ref 96–112)
Creatinine, Ser: 1.03 mg/dL (ref 0.50–1.35)
Glucose, Bld: 155 mg/dL — ABNORMAL HIGH (ref 70–99)

## 2011-05-02 LAB — GLUCOSE, CAPILLARY
Glucose-Capillary: 202 mg/dL — ABNORMAL HIGH (ref 70–99)
Glucose-Capillary: 162 mg/dL — ABNORMAL HIGH (ref 70–99)
Glucose-Capillary: 165 mg/dL — ABNORMAL HIGH (ref 70–99)

## 2011-05-02 MED ORDER — MAGNESIUM SULFATE 40 MG/ML IJ SOLN
2.0000 g | Freq: Once | INTRAMUSCULAR | Status: AC
  Administered 2011-05-02: 2 g via INTRAVENOUS
  Filled 2011-05-02: qty 50

## 2011-05-02 NOTE — Progress Notes (Signed)
Physical Therapy Treatment Patient Details Name: Johnny [name] MRN: [id number] DOB: 26-Aug-1934 Today's Date: 05/02/2011  PT Assessment/Plan  PT - Assessment/Plan Comments on Treatment Session: Pt admitted for tachycardia and weakness. Pt with increased activity tolerance and ambulation today. Pt encouraged to continue mobility but only with assist.  PT Plan: Discharge plan remains appropriate;Frequency remains appropriate Follow Up Recommendations: Supervision for mobility/OOB PT Goals  Acute Rehab PT Goals PT Goal: Supine/Side to Sit - Progress: Progressing toward goal PT Goal: Sit to Supine/Side - Progress: Progressing toward goal Pt will go Sit to Stand: with modified independence PT Goal: Sit to Stand - Progress: Updated due to goal met PT Goal: Ambulate - Progress: Progressing toward goal PT Goal: Up/Down Stairs - Progress: Progressing toward goal  PT Treatment Precautions/Restrictions  Precautions Precautions: Fall Restrictions Weight Bearing Restrictions: No Mobility (including Balance) Bed Mobility Supine to Sit: 6: Modified independent (Device/Increase time);HOB flat Sitting - Scoot to Edge of Bed: 6: Modified independent (Device/Increase time) Transfers Sit to Stand: 5: Supervision;From bed Sit to Stand Details (indicate cue type and reason): cues for hand placement and safety Stand to Sit: 5: Supervision;To chair/3-in-1;With armrests Stand to Sit Details: cues for hand placement and safety Ambulation/Gait Ambulation/Gait Assistance: 4: Min assist Ambulation/Gait Assistance Details (indicate cue type and reason): minguard with VC to step into RW and for extending his trunk Ambulation Distance (Feet): 120 Feet Assistive device: Rolling walker Gait Pattern: Trunk flexed;Decreased stride length  Posture/Postural Control Posture/Postural Control: Postural limitations Postural Limitations: kyphotic posture Exercise  General Exercises - Lower Extremity Long Arc Quad:  AROM;Both;20 reps;Seated Hip ABduction/ADduction: AROM;Both;20 reps;Seated Hip Flexion/Marching: AROM;Both;Other reps (comment);Seated (20reps) End of Session PT - End of Session Equipment Utilized During Treatment: Gait belt Activity Tolerance: Patient tolerated treatment well Patient left: in chair;with call bell in reach Nurse Communication: Mobility status for transfers;Mobility status for ambulation General Behavior During Session: [facility] for tasks performed Cognition: Impaired  Delorse Lek 05/02/2011, 2:39 PM [name], PT [phone]

## 2011-05-02 NOTE — Progress Notes (Signed)
Inpatient Diabetes Program Recommendations  AACE/ADA: New Consensus Statement on Inpatient Glycemic Control (2009)  Target Ranges:  Prepandial:   less than 140 mg/dL      Peak postprandial:   less than 180 mg/dL (1-2 hours)      Critically ill patients:  140 - 180 mg/dL     Results for Johnny [name], Johnny [name] (MRN [id number]) as of 05/02/2011 13:58  Ref. Range 05/02/2011 06:25 05/02/2011 10:49 05/02/2011 11:32  Glucose-Capillary Latest Range: 70-99 mg/dL 409 (H)  811 (H)    Inpatient Diabetes Program Recommendations Insulin - Meal Coverage: Add Novolog 3 units TID for meal coverage due to elevated prandial glucose

## 2011-05-02 NOTE — Progress Notes (Cosign Needed)
[facility]ESS NOTE    SUBJECTIVE   Patients tremor has reduced significantly.  He still has a very minimal right thumb postural tremor but I do not note any rigidity in UE or LE.  No complaints about medication SE.   OBJECTIVE   Vital signs in last 24 hours: Temp:  [97.6 F (36.4 C)-98.1 F (36.7 C)] 98.1 F (36.7 C) (01/11 0607) Pulse Rate:  [61-74] 74  (01/11 0607) Resp:  [18-20] 18  (01/11 0607) BP: (116-158)/(62-75) 158/64 mmHg (01/11 0607) SpO2:  [91 %-94 %] 94 % (01/11 0607)  Intake/Output from previous day: 01/10 0701 - 01/11 0700 In: -  Out: 200 [Urine:200] Intake/Output this shift:   Nutritional status: Carb Control  Past Medical History  Diagnosis Date  . Acute alcoholic hepatitis 05/20/2010  . Anemia of other chronic disease 08/16/2007  . Atrial fibrillation 08/12/2007  . CORONARY ARTERY DISEASE 12/11/2006  . DIABETES MELLITUS, TYPE II 12/11/2006  . HYPERTENSION 12/11/2006  . Hyperlipidemia   . ETOH abuse   . Chronic low back pain     Neurologic Exam:  Mental Status: Alert, oriented.  Speech fluent without evidence of aphasia. Able to follow 3 step commands without difficulty. Cranial Nerves: II-Visual fields grossly intact. III/IV/VI-Extraocular movements intact.  Pupils reactive bilaterally. V/VII-Smile symmetric continues to have masked facies VIII-grossly intact XI-bilateral shoulder shrug XII-midline tongue extension Motor: 4/5 bilaterally with normal tone, very minimal resting tremor with right thumb.  No postural tremor Sensory: Pinprick and light touch intact throughout, bilaterally Deep Tendon Reflexes: 2+ and symmetric throughout Plantars: Downgoing bilaterally Cerebellar: Normal finger-to-nose,  Lab Results: Lab Results  Component Value Date/Time   CHOL  Value: 129        ATP III CLASSIFICATION:  <200     mg/dL   Desirable  409-811  mg/dL   Borderline High  >=914    mg/dL   High         10/27/2954  6:40 AM   Lipid Panel No results found for this basename: CHOL,TRIG,HDL,CHOLHDL,VLDL,LDLCALC in the last 72 hours  Studies/Results: No results found.  Medications:     Scheduled:   . amLODipine  10 mg Oral Daily  . aspirin  81 mg Oral Daily  . carbidopa-levodopa  1 tablet Oral BID  . cloNIDine  0.3 mg Oral BID  . folic acid  1 mg Oral Daily  . hydrALAZINE  25 mg Oral Q8H  . insulin aspart  0-9 Units Subcutaneous TID WC  . insulin glargine  15 Units Subcutaneous QHS  . LORazepam  0-4 mg Oral Q12H  . magnesium sulfate 1 - 4 g bolus IVPB  2 g Intravenous Once  . metoprolol  100 mg Oral BID  . mulitivitamin with minerals  1 tablet Oral Daily  . pantoprazole  40 mg Oral Daily  . thiamine  100 mg Oral Daily   Or  . thiamine  100 mg Intravenous Daily    Assessment/Plan:    Patient Active Hospital Problem List:    Lower extremity weakness (04/28/2011)   Assessment:  Patient has done very well on Sinemet 25-100 BID.  His tremor has decreased significantly and weakness has improved.     Plan: Continue Sinemet at same dose and administered at same times on discharge.  Paient will need to follow up out patient with neurology to further adjust his medications.   Neurology S/O   Felicie Morn PA-C [facility] [phone]  05/02/2011, 10:32 AM

## 2011-05-02 NOTE — Progress Notes (Signed)
Subjective:  Denies any heart palpitations or chest pain.  No acute issues overnight.  Objective: Filed Vitals:   05/02/11 0600 05/02/11 0607 05/02/11 1317 05/02/11 1433  BP:  158/64 127/60   Pulse: 72 74 71 72  Temp:  98.1 F (36.7 C) 98.2 F (36.8 C)   TempSrc:  Oral    Resp:  18 18   Height:      Weight:      SpO2:  94% 98% 99%   Weight change:   Intake/Output Summary (Last 24 hours) at 05/02/11 1744 Last data filed at 05/02/11 1300  Gross per 24 hour  Intake    480 ml  Output    800 ml  Net   -320 ml    General: Alert, awake, oriented x3, in no acute distress.  HEENT: No bruits, no goiter.  Heart: Regular rate and rhythm, without murmurs, rubs, gallops.  Lungs: Clear to auscultation.  Abdomen: Soft, nontender, nondistended, positive bowel sounds.  Neuro: Grossly intact, nonfocal.   Lab Results:  Basename 05/02/11 0500 05/01/11 1300  NA 137 134*  K 3.7 3.9  CL 98 94*  CO2 27 31  GLUCOSE 155* 238*  BUN 23 22  CREATININE 1.03 1.20  CALCIUM 8.9 8.7  MG 1.5 1.1*  PHOS -- --    Basename 04/30/11 0550  AST 24  ALT 13  ALKPHOS 143*  BILITOT 1.0  PROT 7.6  ALBUMIN 3.3*   No results found for this basename: LIPASE:2,AMYLASE:2 in the last 72 hours  Basename 04/30/11 0550  WBC 7.1  NEUTROABS --  HGB 11.8*  HCT 34.8*  MCV 96.1  PLT 121*   No results found for this basename: CKTOTAL:3,CKMB:3,CKMBINDEX:3,TROPONINI:3 in the last 72 hours No components found with this basename: POCBNP:3 No results found for this basename: DDIMER:2 in the last 72 hours No results found for this basename: HGBA1C:2 in the last 72 hours No results found for this basename: CHOL:2,HDL:2,LDLCALC:2,TRIG:2,CHOLHDL:2,LDLDIRECT:2 in the last 72 hours  Basename 04/30/11 0550  TSH 4.408  T4TOTAL --  T3FREE 4.0  THYROIDAB --   No results found for this basename: VITAMINB12:2,FOLATE:2,FERRITIN:2,TIBC:2,IRON:2,RETICCTPCT:2 in the last 72 hours  Micro Results: Recent Results (from  the past 240 hour(s))  MRSA PCR SCREENING     Status: Normal   Collection Time   04/29/11 12:20 AM      Component Value Range Status Comment   MRSA by PCR NEGATIVE  NEGATIVE  Final     Studies/Results: No results found.  Medications: I have reviewed the patient's current medications.   Patient Active Hospital Problem List: Tachycardia (04/28/2011) At this point have discussed with cardiology and patient will need magnesium replaced. Last magnesium was 1.5. Will plan on replacing and checking levels tomorrow.  Pt has not had any reported episodes of tachycardia today and strongly suspect that yesterday's non sustained V tach on telemetry was due to Low magnesium level.  Patient will get more magnesium today and if tomorrow magnesium is within normal limits and no other events will plan on sending home tomorrow with SNF.  DIABETES MELLITUS, TYPE II (12/11/2006) Stable will continue to monitor.   HYPERTENSION (12/11/2006) Blood pressures are better controlled currently with recent increase in metoprolol. Last BP 116/62   CORONARY ARTERY DISEASE (12/11/2006) Stable.   Atrial fibrillation (08/12/2007) Pt on metoprolol. Last EKG on 04/28/11 reviewed with cardiology and patient was sinus tachycardia   Lower extremity weakness (04/28/2011) Patient had an MRI which showed moderate to severe spinal stenosis. And  severe right foraminal encroachement. Spoke to [place] who is the coordinator at an orthopaedic office and she recommends we have the patient f/u as an outpatient. Given that patient has been seen by neurology and currently he is asymptomatic and has been able to ambulate with assistance I feel that this is a reasonable option. Patient denies any incontinence or focal weakness. [phone] Toniann Fail at orthopaedic office.         LOS: 4 days   Penny Pia M.D.  [facility] 05/02/2011, 5:44 PM

## 2011-05-02 NOTE — Progress Notes (Signed)
  Echocardiogram 2D Echocardiogram has been performed.  Jorje Guild [facility] 05/02/2011, 11:20 AM

## 2011-05-03 LAB — BASIC METABOLIC PANEL
CO2: 30 mEq/L (ref 19–32)
Calcium: 8.9 mg/dL (ref 8.4–10.5)
Creatinine, Ser: 1.03 mg/dL (ref 0.50–1.35)
GFR calc Af Amer: 81 mL/min — ABNORMAL LOW (ref >90)
GFR calc non Af Amer: 69 mL/min — ABNORMAL LOW (ref >90)
Sodium: 134 mEq/L — ABNORMAL LOW (ref 135–145)

## 2011-05-03 LAB — MAGNESIUM: Magnesium: 1.7 mg/dL (ref 1.5–2.5)

## 2011-05-03 LAB — GLUCOSE, CAPILLARY: Glucose-Capillary: 203 mg/dL — ABNORMAL HIGH (ref 70–99)

## 2011-05-03 MED ORDER — MAGNESIUM OXIDE 400 MG PO TABS: 250.0000 mg | ORAL_TABLET | Freq: Every day | ORAL | Status: DC

## 2011-05-03 MED ORDER — POTASSIUM CHLORIDE CRYS ER 20 MEQ PO TBCR
EXTENDED_RELEASE_TABLET | ORAL | Status: AC
  Filled 2011-05-03: qty 1

## 2011-05-03 MED ORDER — POTASSIUM CHLORIDE CRYS ER 20 MEQ PO TBCR
20.0000 meq | EXTENDED_RELEASE_TABLET | Freq: Two times a day (BID) | ORAL | Status: DC
  Administered 2011-05-03: 20 meq via ORAL

## 2011-05-03 NOTE — Progress Notes (Signed)
Pt. Discharged 05/03/2011  6:33 PM Discharge instructions reviewed with patient/family. Patient/family verbalized understanding. All Rx's given. Questions answered as needed. Pt. Discharged to home with family/self. Taken off unit via W/C. Chrislyn Seedorf

## 2011-05-03 NOTE — Progress Notes (Signed)
   CARE MANAGEMENT NOTE 05/03/2011  Patient:  Johnny [name], Johnny [name]   Account Number:  [id number]  Date Initiated:  04/30/2011  Documentation initiated by:  [facility]  Subjective/Objective Assessment:   Admitted with hip pain, tachycardia.Lives with 2  nephews, has 2 daughters. Has rolling walker and cane. Had had [facility] in the past.     Action/Plan:   PT eval- recommending HHPT with 24h supervision   Anticipated DC Date:  05/01/2011   Anticipated DC Plan:  HOME W HOME HEALTH SERVICES      DC Planning Services  CM consult      Choice offered to / List presented to:  C-4 Adult Children        HH arranged  HH-1 RN  HH-2 PT  HH-3 OT      [facility] agency  [facility]   Status of service:  Completed, signed off Medicare Important Message given?  NO (If response is "NO", the following Medicare IM given date fields will be blank) Date Medicare IM given:   Date Additional Medicare IM given:    Discharge Disposition:  HOME W HOME HEALTH SERVICES  Per UR Regulation:  Reviewed for med. necessity/level of care/duration of stay  Comments:  PCP Dr. Lesia Hausen  05/02/10 1015--TC to Mardella Layman with [facility] to make aware of discharge today. Tera Mater, RN, BSN Nurse Case Manager [phone]   05/01/11 Spoke with patient's daughter Arby Barrette [phone], she wanted to have [facility] again for HHRN,PT, and OT. She confirmed that the patient would have 24h supervision. Contacted Debbie at [facility], they no longer work with [employer]. Contacted Ms Lilia Pro again and she selected [facility]. Contacted Mary at [facility] and set up [facility], PT and OT. They will contact the patient to schedule first visit after d/c. Jacquelynn Cree RN, BSN, [facility]  04/30/11 Spoke with patient about d/c plans. He states that he has two nephews who live with him and he would have 24h supervision. He does not want to go to a facoility. He has had [facility] in the past but can not remember the agency. He would like  me to speak with his daughter on 05/01/11 about [facility] agencies. Will cont to follow. Jacquelynn Cree RN, BSN, CCM
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
## 2011-05-07 NOTE — Progress Notes (Signed)
05/07/2011 Demar Shad SPARKS Case Management Note [phone] Utilization review completed.  

## 2011-05-08 ENCOUNTER — Telehealth: Payer: Self-pay | Admitting: *Deleted

## 2011-05-08 NOTE — Telephone Encounter (Signed)
[facility] is calling to tell Dr. Kirtland Bouchard pt has refused all treatment from them.

## 2011-05-09 NOTE — Telephone Encounter (Signed)
Dr. Kirtland Bouchard mad aware.

## 2011-05-19 ENCOUNTER — Telehealth: Payer: Self-pay | Admitting: Internal Medicine

## 2011-05-19 MED ORDER — INSULIN GLARGINE 100 UNIT/ML ~~LOC~~ SOLN: 15.0000 [IU] | Freq: Every day | SUBCUTANEOUS | Status: DC

## 2011-05-19 NOTE — Telephone Encounter (Signed)
Rx sent to pharmacy electronically.   

## 2011-05-19 NOTE — Telephone Encounter (Signed)
Pt called and is out of his insulin glargine (LANTUS) 100 UNIT/ML injection. Pls call in to [facility].

## 2011-05-20 ENCOUNTER — Telehealth: Payer: Self-pay | Admitting: Internal Medicine

## 2011-05-20 MED ORDER — "INSULIN SYRINGE-NEEDLE U-100 30G X 5/16"" 0.3 ML MISC": Status: DC

## 2011-05-20 NOTE — Telephone Encounter (Signed)
Rx sent to pharmacy electronically.   

## 2011-05-20 NOTE — Telephone Encounter (Signed)
Pharmacy called and said that pt needs a script for the syringes to go will his Lantus. Pls call in syringes asap today.

## 2011-05-22 DIAGNOSIS — I498 Other specified cardiac arrhythmias: Secondary | ICD-10-CM

## 2011-05-22 DIAGNOSIS — M5137 Other intervertebral disc degeneration, lumbosacral region: Secondary | ICD-10-CM

## 2011-05-22 DIAGNOSIS — M6281 Muscle weakness (generalized): Secondary | ICD-10-CM

## 2011-05-22 DIAGNOSIS — M48061 Spinal stenosis, lumbar region without neurogenic claudication: Secondary | ICD-10-CM

## 2011-07-01 ENCOUNTER — Emergency Department (HOSPITAL_COMMUNITY): Payer: Medicare PPO

## 2011-07-01 ENCOUNTER — Other Ambulatory Visit: Payer: Self-pay

## 2011-07-01 ENCOUNTER — Encounter (HOSPITAL_COMMUNITY): Payer: Self-pay | Admitting: *Deleted

## 2011-07-01 ENCOUNTER — Emergency Department (HOSPITAL_COMMUNITY)
Admission: EM | Admit: 2011-07-01 | Discharge: 2011-07-01 | Disposition: A | Discharge status: Home / Self Care | Payer: Medicare PPO | Attending: Emergency Medicine | Admitting: Emergency Medicine

## 2011-07-01 DIAGNOSIS — R4182 Altered mental status, unspecified: Secondary | ICD-10-CM | POA: Insufficient documentation

## 2011-07-01 DIAGNOSIS — R569 Unspecified convulsions: Secondary | ICD-10-CM | POA: Insufficient documentation

## 2011-07-01 DIAGNOSIS — I4891 Unspecified atrial fibrillation: Secondary | ICD-10-CM | POA: Insufficient documentation

## 2011-07-01 DIAGNOSIS — F1010 Alcohol abuse, uncomplicated: Secondary | ICD-10-CM | POA: Insufficient documentation

## 2011-07-01 DIAGNOSIS — E785 Hyperlipidemia, unspecified: Secondary | ICD-10-CM | POA: Insufficient documentation

## 2011-07-01 DIAGNOSIS — I10 Essential (primary) hypertension: Secondary | ICD-10-CM | POA: Insufficient documentation

## 2011-07-01 DIAGNOSIS — I2510 Atherosclerotic heart disease of native coronary artery without angina pectoris: Secondary | ICD-10-CM | POA: Insufficient documentation

## 2011-07-01 DIAGNOSIS — Z951 Presence of aortocoronary bypass graft: Secondary | ICD-10-CM | POA: Insufficient documentation

## 2011-07-01 DIAGNOSIS — E119 Type 2 diabetes mellitus without complications: Secondary | ICD-10-CM | POA: Insufficient documentation

## 2011-07-01 LAB — COMPREHENSIVE METABOLIC PANEL
Albumin: 3.4 g/dL — ABNORMAL LOW (ref 3.5–5.2)
BUN: 12 mg/dL (ref 6–23)
Chloride: 98 mEq/L (ref 96–112)
Creatinine, Ser: 0.97 mg/dL (ref 0.50–1.35)
GFR calc non Af Amer: 79 mL/min — ABNORMAL LOW (ref >90)
Total Bilirubin: 0.5 mg/dL (ref 0.3–1.2)

## 2011-07-01 LAB — CBC
HCT: 33 % — ABNORMAL LOW (ref 39.0–52.0)
MCH: 32.1 pg (ref 26.0–34.0)
MCHC: 34.2 g/dL (ref 30.0–36.0)
MCV: 93.8 fL (ref 78.0–100.0)
RDW: 15.2 % (ref 11.5–15.5)
WBC: 6.3 10*3/uL (ref 4.0–10.5)

## 2011-07-01 LAB — DIFFERENTIAL
Basophils Absolute: 0 10*3/uL (ref 0.0–0.1)
Eosinophils Relative: 0 % (ref 0–5)
Lymphocytes Relative: 26 % (ref 12–46)
Monocytes Absolute: 0.8 10*3/uL (ref 0.1–1.0)

## 2011-07-01 LAB — GLUCOSE, CAPILLARY

## 2011-07-01 LAB — PROTIME-INR
INR: 1.08 (ref 0.00–1.49)
Prothrombin Time: 14.2 seconds (ref 11.6–15.2)

## 2011-07-01 LAB — URINALYSIS, ROUTINE W REFLEX MICROSCOPIC
Bilirubin Urine: NEGATIVE
Glucose, UA: NEGATIVE mg/dL
Hgb urine dipstick: NEGATIVE
Specific Gravity, Urine: 1.005 (ref 1.005–1.030)
pH: 7.5 (ref 5.0–8.0)

## 2011-07-01 MED ORDER — POTASSIUM CHLORIDE 10 MEQ/100ML IV SOLN
10.0000 meq | Freq: Once | INTRAVENOUS | Status: AC
  Administered 2011-07-01: 10 meq via INTRAVENOUS
  Filled 2011-07-01: qty 100

## 2011-07-01 MED ORDER — SODIUM CHLORIDE 0.9 % IV SOLN
Freq: Once | INTRAVENOUS | Status: AC
  Administered 2011-07-01: 19:00:00 via INTRAVENOUS

## 2011-07-01 MED ORDER — THIAMINE HCL 100 MG/ML IJ SOLN
100.0000 mg | Freq: Every day | INTRAMUSCULAR | Status: DC
  Administered 2011-07-01: 200 mg via INTRAVENOUS
  Filled 2011-07-01: qty 2

## 2011-07-01 MED ORDER — LORAZEPAM 2 MG/ML IJ SOLN
1.0000 mg | Freq: Once | INTRAMUSCULAR | Status: AC
  Administered 2011-07-01: 16:00:00 via INTRAVENOUS
  Filled 2011-07-01: qty 1

## 2011-07-01 MED ORDER — POTASSIUM CHLORIDE CRYS ER 20 MEQ PO TBCR
40.0000 meq | EXTENDED_RELEASE_TABLET | Freq: Once | ORAL | Status: AC
  Administered 2011-07-01: 40 meq via ORAL
  Filled 2011-07-01: qty 2

## 2011-07-01 NOTE — ED Provider Notes (Signed)
History     CSN: [id number]  Arrival date & time 07/01/11  1358   First MD Initiated Contact with Patient 07/01/11 1504      Chief Complaint  Patient presents with  . Seizures   Level 5 caveat.  Altered mental status (Consider location/radiation/quality/duration/timing/severity/associated sxs/prior treatment) HPI Pt is not sure why he is here.  According to the notes, he presents to the ED after a seizure.  Pt however denies this.  He also denies having a history of seizures.  Pt denies any complaints.  No headache or chest pain.  Pt states he does drink alcohol occasionally.   Denies any recent use.  Denies any neck pain or back pain.  No numbness or weakness.  He denies any recent illness. Past Medical History  Diagnosis Date  . Acute alcoholic hepatitis 05/20/2010  . Anemia of other chronic disease 08/16/2007  . Atrial fibrillation 08/12/2007  . CORONARY ARTERY DISEASE 12/11/2006  . DIABETES MELLITUS, TYPE II 12/11/2006  . HYPERTENSION 12/11/2006  . Hyperlipidemia   . ETOH abuse   . Chronic low back pain     Past Surgical History  Procedure Date  . Coronary artery bypass graft     Family History  Problem Relation Age of Onset  . Heart failure Mother   . Aneurysm Sister   . Suicidality Brother   . Heart failure Brother   . Diabetes Sister     History  Substance Use Topics  . Smoking status: Former Smoker    Quit date: 04/22/1983  . Smokeless tobacco: Not on file  . Alcohol Use: Yes     hx of ETOH      Review of Systems  All other systems reviewed and are negative.    Allergies  Sulfa antibiotics  Home Medications   Current Outpatient Rx  Name Route Sig Dispense Refill  . AMLODIPINE BESYLATE 10 MG PO TABS Oral Take 10 mg by mouth daily.      . ASPIRIN 81 MG PO TABS Oral Take 81 mg by mouth daily.      Marland Kitchen CLONIDINE HCL 0.1 MG PO TABS Oral Take 0.1 mg by mouth 2 (two) times daily.      Marland Kitchen FOLIC ACID 1 MG PO TABS Oral Take 1 mg by mouth daily.      .  INSULIN GLARGINE 100 UNIT/ML [place] SOLN Subcutaneous Inject 15 Units into the skin at bedtime. 10 mL 3  . INSULIN SYRINGE-NEEDLE U-100 30G X 5/16" 0.3 ML MISC  Test daily. 100 each 1    Dx code:  250.00  . MAGNESIUM OXIDE 400 MG PO TABS Oral Take 0.5 tablets (200 mg total) by mouth daily. 15 tablet 0  . METOPROLOL TARTRATE 50 MG PO TABS Oral Take 50 mg by mouth 2 (two) times daily.      Marland Kitchen PANTOPRAZOLE SODIUM 40 MG PO TBEC Oral Take 40 mg by mouth daily.      . THIAMINE HCL 100 MG PO TABS Oral Take 100 mg by mouth daily.        BP 211/73  Pulse 96  Temp(Src) 98.1 F (36.7 C) (Oral)  Resp 16  SpO2 98%  Physical Exam  Nursing note and vitals reviewed. Constitutional: He is oriented to person, place, and time. He appears well-developed and well-nourished. No distress.  HENT:  Head: Normocephalic and atraumatic.  Right Ear: External ear normal.  Left Ear: External ear normal.  Mouth/Throat: Oropharynx is clear and moist.  Bruise noted on tongue  Eyes: Conjunctivae are normal. Right eye exhibits no discharge. Left eye exhibits no discharge. No scleral icterus.  Neck: Neck supple. No tracheal deviation present.  Cardiovascular: Normal rate, regular rhythm and intact distal pulses.   Pulmonary/Chest: Effort normal and breath sounds normal. No stridor. No respiratory distress. He has no wheezes. He has no rales.  Abdominal: Soft. Bowel sounds are normal. He exhibits no distension. There is no tenderness. There is no rebound and no guarding.  Musculoskeletal: He exhibits no edema and no tenderness.       Cervical back: Normal.       Thoracic back: Normal.       Lumbar back: Normal.  Neurological: He is alert and oriented to person, place, and time. He has normal strength. No sensory deficit. Cranial nerve deficit:  no gross defecits noted. He exhibits normal muscle tone. He displays no seizure activity. Coordination normal.       No pronator drift bilateral upper extrem, able to hold both  legs off bed for 5 seconds, sensation intact in all extremities, no visual field cuts, no left or right sided neglect  Resting tremor  Skin: Skin is warm and dry. No rash noted.  Psychiatric: He has a normal mood and affect.    ED Course  Procedures (including critical care time)  Labs Reviewed  CBC - Abnormal; Notable for the following:    RBC 3.52 (*)    Hemoglobin 11.3 (*)    HCT 33.0 (*)    All other components within normal limits  COMPREHENSIVE METABOLIC PANEL - Abnormal; Notable for the following:    Potassium 2.8 (*)    Albumin 3.4 (*)    Alkaline Phosphatase 119 (*)    GFR calc non Af Amer 79 (*)    All other components within normal limits  PHENYTOIN LEVEL, TOTAL - Abnormal; Notable for the following:    Phenytoin Lvl <2.5 (*)    All other components within normal limits  GLUCOSE, CAPILLARY - Abnormal; Notable for the following:    Glucose-Capillary 107 (*)    All other components within normal limits  DIFFERENTIAL  ETHANOL  PROTIME-INR  URINALYSIS, ROUTINE W REFLEX MICROSCOPIC   Ct Head Wo Contrast  07/01/2011  *RADIOLOGY REPORT*  Clinical Data: Seizures.  CT HEAD WITHOUT CONTRAST  Technique:  Contiguous axial images were obtained from the base of the skull through the vertex without contrast.  Comparison: 09/24/2009  Findings: There is significant central cortical atrophy. Periventricular white matter changes are consistent with small vessel disease. There is no evidence for hemorrhage, mass lesion, or acute infarction.  Bone windows show no calvarial fracture.  IMPRESSION:  1.  Atrophy and small vessel disease. 2. No evidence for acute intracranial abnormality.  Original Report Authenticated By: Patterson Hammersmith, M.D.     MDM  The patient per EMS has history of seizures. I presume they are alcohol withdrawal seizures considering his alcohol abuse history. The patient denies any complaints in the emergency department.  is somewhat somnolent now, although I suspect  this is from the Ativan. I will perform a CT scan of the brain to make sure there is no signs of any significant injury. I anticipate the patient will be able to be discharged home with followup with his primary care physician. I doubt syncope or acute infection. The patient has been treated for his hypokalemia the emergency department. His blood pressure has decreased as well without particular intervention.  Celene Kras, MD 07/01/11 2040

## 2011-07-01 NOTE — ED Notes (Addendum)
Pt had seizure witnessed by family members who also verify pt has hx of seizures. Pt is alert at this time. Postictally, pt has been combative and confused.

## 2011-07-01 NOTE — Discharge Instructions (Signed)
I suspect that she did have a seizure today. It is possible it could be related to alcohol use. Please followup with your doctor this week to consider further treatment, testing. Return to emergency room for any worsening symptoms.  Seizures You had a seizure. About 2% of the population will have a seizure problem during their lifetime. Sometimes the cause for the seizure is not known. Seizures are usually associated with one of these problems:  Epilepsy.   Not taking your seizure medicine.   Alcohol and drug abuse.   Head injury, strokes, tumors, and brain surgery.   High fever and infections.   Low blood sugar.  Evaluating a new seizure disorder may require having a brain scan or a brain wave test called an EEG. If you have been given a seizure medicine, it is very important that you take it as prescribed. Not taking these medicines as directed is the most common cause of seizures. Blood tests are often used to be sure you are taking the proper dose.  Seizures cause many different symptoms, from convulsions to brief blackouts. Do not ride a bike, drive a car, go swimming, climb in high or dangerous places such as ladders or roofs, or operate any dangerous equipment until you have your doctor's permission. If you hold a driver's license, state law may require that a report be made to the motor vehicles department. You should wear an emergency medical identification bracelet with information about your seizures. If you have any warning that a seizure may occur, lie down in a safe place to protect yourself. Teach your family and friends what to do if you have any further seizures. They should stay calm and try to keep you from falling on hard or sharp objects. It is best not to try to restrain a seizing person or to force anything into his or her mouth. Do not try to open clenched jaws. When the seizure is over, the person should be rolled on their side to help drain any vomit or secretions from the  mouth. After a seizure, a person may be confused or drowsy for several minutes. An ambulance should be called if the seizure lasted more than 5 minutes or if confusion remains for more than 30 minutes. Call your caregiver or the emergency department for further instructions. Do not drive until cleared by your caregiver or neurologist! Document Released: 05/15/2004 Document Revised: 12/18/2010 Document Reviewed: 04/07/2005 [facility] Patient Information 2012 [place], [place].

## 2011-07-01 NOTE — ED Notes (Signed)
Daughter here to take pt home-Dr.J.Knapp in to talk with pt and daughter-pt to follow up with his PCP and get referral as needed for Neuro-pt in stable condition-daughter states he lives at home with 2 cousins who care for him-pt discharged in stable condition with no further seizure activity.

## 2011-07-01 NOTE — ED Notes (Signed)
Unable to get blood from iv . Lab notified

## 2011-07-01 NOTE — ED Notes (Signed)
[id number]<AV> Expected date:<BR> Expected time: 1:48 PM<BR> Means of arrival:<BR> Comments:<BR> M110 - 72yoM Seizure with hx

## 2011-08-26 ENCOUNTER — Emergency Department (HOSPITAL_COMMUNITY)
Admission: EM | Admit: 2011-08-26 | Discharge: 2011-08-26 | Disposition: A | Discharge status: Home / Self Care | Payer: Medicare PPO | Attending: Emergency Medicine | Admitting: Emergency Medicine

## 2011-08-26 ENCOUNTER — Encounter (HOSPITAL_COMMUNITY): Payer: Self-pay | Admitting: Family Medicine

## 2011-08-26 DIAGNOSIS — E119 Type 2 diabetes mellitus without complications: Secondary | ICD-10-CM | POA: Insufficient documentation

## 2011-08-26 DIAGNOSIS — E785 Hyperlipidemia, unspecified: Secondary | ICD-10-CM | POA: Insufficient documentation

## 2011-08-26 DIAGNOSIS — M79609 Pain in unspecified limb: Secondary | ICD-10-CM | POA: Insufficient documentation

## 2011-08-26 DIAGNOSIS — F10929 Alcohol use, unspecified with intoxication, unspecified: Secondary | ICD-10-CM

## 2011-08-26 DIAGNOSIS — I2510 Atherosclerotic heart disease of native coronary artery without angina pectoris: Secondary | ICD-10-CM | POA: Insufficient documentation

## 2011-08-26 DIAGNOSIS — R627 Adult failure to thrive: Secondary | ICD-10-CM | POA: Insufficient documentation

## 2011-08-26 DIAGNOSIS — M533 Sacrococcygeal disorders, not elsewhere classified: Secondary | ICD-10-CM | POA: Insufficient documentation

## 2011-08-26 DIAGNOSIS — F1010 Alcohol abuse, uncomplicated: Secondary | ICD-10-CM

## 2011-08-26 DIAGNOSIS — I4891 Unspecified atrial fibrillation: Secondary | ICD-10-CM | POA: Insufficient documentation

## 2011-08-26 DIAGNOSIS — I10 Essential (primary) hypertension: Secondary | ICD-10-CM | POA: Insufficient documentation

## 2011-08-26 LAB — URINALYSIS, MICROSCOPIC ONLY
Hgb urine dipstick: NEGATIVE
Leukocytes, UA: NEGATIVE
Nitrite: NEGATIVE
Specific Gravity, Urine: 1.013 (ref 1.005–1.030)
Urobilinogen, UA: 0.2 mg/dL (ref 0.0–1.0)

## 2011-08-26 LAB — COMPREHENSIVE METABOLIC PANEL
ALT: 29 U/L (ref 0–53)
CO2: 26 mEq/L (ref 19–32)
Calcium: 8.1 mg/dL — ABNORMAL LOW (ref 8.4–10.5)
Creatinine, Ser: 0.88 mg/dL (ref 0.50–1.35)
GFR calc Af Amer: >90 mL/min (ref >90)
GFR calc non Af Amer: 83 mL/min — ABNORMAL LOW (ref >90)
Glucose, Bld: 94 mg/dL (ref 70–99)
Total Bilirubin: 0.2 mg/dL — ABNORMAL LOW (ref 0.3–1.2)

## 2011-08-26 LAB — CK: Total CK: 226 U/L (ref 7–232)

## 2011-08-26 LAB — CBC
Hemoglobin: 11.6 g/dL — ABNORMAL LOW (ref 13.0–17.0)
MCH: 34.2 pg — ABNORMAL HIGH (ref 26.0–34.0)
MCV: 98.2 fL (ref 78.0–100.0)
RBC: 3.39 MIL/uL — ABNORMAL LOW (ref 4.22–5.81)

## 2011-08-26 LAB — TROPONIN I: Troponin I: <0.3 ng/mL (ref <0.30)

## 2011-08-26 MED ORDER — ADULT MULTIVITAMIN W/MINERALS CH
1.0000 | ORAL_TABLET | Freq: Once | ORAL | Status: AC
  Administered 2011-08-26: 1 via ORAL
  Filled 2011-08-26: qty 1

## 2011-08-26 MED ORDER — MORPHINE SULFATE 4 MG/ML IJ SOLN
4.0000 mg | Freq: Once | INTRAMUSCULAR | Status: AC
  Administered 2011-08-26: 4 mg via INTRAVENOUS
  Filled 2011-08-26: qty 1

## 2011-08-26 MED ORDER — VITAMIN B-1 100 MG PO TABS
100.0000 mg | ORAL_TABLET | Freq: Once | ORAL | Status: AC
  Administered 2011-08-26: 100 mg via ORAL
  Filled 2011-08-26: qty 1

## 2011-08-26 MED ORDER — OXYCODONE HCL 5 MG PO TABS: 5.0000 mg | ORAL_TABLET | ORAL | Status: AC | PRN

## 2011-08-26 MED ORDER — FOLIC ACID 1 MG PO TABS
1.0000 mg | ORAL_TABLET | Freq: Once | ORAL | Status: AC
  Administered 2011-08-26: 1 mg via ORAL
  Filled 2011-08-26: qty 1

## 2011-08-26 MED ORDER — SODIUM CHLORIDE 0.9 % IV BOLUS (SEPSIS)
1000.0000 mL | Freq: Once | INTRAVENOUS | Status: AC
  Administered 2011-08-26: 1000 mL via INTRAVENOUS

## 2011-08-26 NOTE — ED Notes (Signed)
Per EMS: pt lives with a cousin. Has hx of ETOH abuse. Reports that for the past 2 months pt has not been taking his meds and is just lying on the couch in his own urine.  Pt is A/O per norm. NAD noted at this time.

## 2011-08-26 NOTE — ED Provider Notes (Signed)
History     CSN: [id number]  Arrival date & time 08/26/11  1352   First MD Initiated Contact with Patient 08/26/11 1608      Chief Complaint  Patient presents with  . Failure To Thrive     The history is provided by the patient.   the patient was brought to the emergency department because of decreased mobility at home and failure to perform standard activities of daily living.  The patient reports that he drinks alcohol every day and is not interested in stopping drinking alcohol.  He denies chest pain or shortness of breath.  He denies abdominal pain.  Said no nausea vomiting or diarrhea.  He does report left lower back pain with radiation to his left thigh.  He reports this is been present for 2 months and decreases his ability to cannulate secondary to pain.  He has tried over-the-counter pain medication without improvement.  He denies urinary or fecal incontinence or retention.  He has no peroneal numbness.  He denies weakness of his lower extremities.  He denies fevers and chills.  He has a history of IV drug abuse.  Past Medical History  Diagnosis Date  . Acute alcoholic hepatitis 05/20/2010  . Anemia of other chronic disease 08/16/2007  . Atrial fibrillation 08/12/2007  . CORONARY ARTERY DISEASE 12/11/2006  . DIABETES MELLITUS, TYPE II 12/11/2006  . HYPERTENSION 12/11/2006  . Hyperlipidemia   . ETOH abuse   . Chronic low back pain     Past Surgical History  Procedure Date  . Coronary artery bypass graft     Family History  Problem Relation Age of Onset  . Heart failure Mother   . Aneurysm Sister   . Suicidality Brother   . Heart failure Brother   . Diabetes Sister     History  Substance Use Topics  . Smoking status: Former Smoker    Quit date: 04/22/1983  . Smokeless tobacco: Not on file  . Alcohol Use: Yes     hx of ETOH      Review of Systems  All other systems reviewed and are negative.    Allergies  Sulfa antibiotics  Home Medications   Current  Outpatient Rx  Name Route Sig Dispense Refill  . OXYCODONE HCL 5 MG PO TABS Oral Take 1 tablet (5 mg total) by mouth every 4 (four) hours as needed for pain. 15 tablet 0    BP 181/90  Pulse 121  Temp(Src) 98.2 F (36.8 C) (Oral)  Resp 23  SpO2 100%  Physical Exam  Nursing note and vitals reviewed. Constitutional: He is oriented to person, place, and time. He appears well-developed and well-nourished.       Smells of alcohol.  Disheveled in appearance  HENT:  Head: Normocephalic and atraumatic.  Eyes: EOM are normal.  Neck: Normal range of motion.  Cardiovascular: Normal rate, regular rhythm, normal heart sounds and intact distal pulses.   Pulmonary/Chest: Effort normal and breath sounds normal. No respiratory distress.  Abdominal: Soft. He exhibits no distension. There is no tenderness.  Musculoskeletal: Normal range of motion.       No cervical thoracic or lumbar spinal tenderness.  Tenderness of his left sciatic groove.  Patient with 5 out of 5 strength in his major muscle groups of his bilateral lower and upper extremities  Neurological: He is alert and oriented to person, place, and time.  Skin: Skin is warm and dry.  Psychiatric: He has a normal mood and affect. Judgment  normal.    ED Course  Procedures (including critical care time)  Labs Reviewed  CBC - Abnormal; Notable for the following:    RBC 3.39 (*)    Hemoglobin 11.6 (*)    HCT 33.3 (*)    MCH 34.2 (*)    RDW 16.1 (*)    All other components within normal limits  COMPREHENSIVE METABOLIC PANEL - Abnormal; Notable for the following:    Calcium 8.1 (*)    Albumin 3.2 (*)    AST 52 (*)    Alkaline Phosphatase 158 (*)    Total Bilirubin 0.2 (*)    GFR calc non Af Amer 83 (*)    All other components within normal limits  ETHANOL - Abnormal; Notable for the following:    Alcohol, Ethyl (B) 310 (*)    All other components within normal limits  URINALYSIS, WITH MICROSCOPIC - Abnormal; Notable for the  following:    Bacteria, UA FEW (*)    All other components within normal limits  TROPONIN I  CK   No results found.   1. Alcohol abuse   2. Alcohol intoxication       MDM  The patient was sent for possible goiter thrive.  I think his major problem is that he has alcohol abuse and alcohol intoxication the emergency department.  I think he prefers to drink alcohol rather than heat or perform his activities of daily living.  He also reported severe left-sided back pain with radiation to his left thigh which sounds consistent with sciatica.  I think this is also been limiting his mobility at home.  His pain is much better in the emergency department.  He'll be sent home with a prescription for pain medicine without Tylenol.  I recommended close followup with his primary care Dr.  He has no weakness of his left lower sternum he does suggest need for MRI scan.  I recommended that he stop drinking alcohol        Lyanne Co, MD 08/26/11 2000

## 2011-08-26 NOTE — ED Notes (Signed)
CSW consulted by Charge RN to assess pt's needs in the home. Pt is currently ready for discharge home. CSW met with pt to discuss home environment and his ability to care for himself. Pt states that he lives with his 2 nephews, Junie Panning and Conard Novak. Pt states that he pays all the bills and his nephews care for him. Pt confirmed that he has been staying on the couch for 2 months, however shared that he has "gotten up to go to the bathroom when he needs to." Pt reported that he needs assistance with preparing meals, bathing and dressing himself. CSW discussed with the pt the possibility of having home health services come in the home to assist with needs. Pt states that he has had home health services in the past and found them to not be helpful. Pt adamantly refuses home health services at this time. CSW explored with the pt SNFs that would also be able to provide assistance for short term rehab, though pt refused information.   CSW also discussed with the pt his alcohol use. Pt states that he drinks daily but does not view it as "a problem", however pt was agreeable to outpatient resources for alcohol abuse/dependence.   Pt provided CSW with verbal permission to contact his nephews at his home [phone]) as well as his daughter Arby Barrette [phone]). CSW spoke with Allayne Stack who states that the pt refuses to take his medications at times and refuses home health services. Jennette Kettle shared that he and his brother "help him as much as he lets [place]." Jennette Kettle shared that he would be home all evening waiting for the pt to return. Jennette Kettle added that pt's son Brendan Gadson was on his way to [facility] at this time. CSW left a message for Lilia Pro though has not heard back at this time.   CSW has contacted APS to report the conditions the pt was found in today. On call APS worker stated the report would be passed to APS services.  CSW met with pt's son, Ndrew Creason, who stated he was concerned regarding the pt's care for  himself. Para March stated he wanted information on his "rights as a son to get his father help." CSW discussed with Para March how to become HPOA and explained when HPOA is activated. CSW also provided [place] with a list of local SNF to talk with the pt about if pt chooses to go at a later time. CSW also provided [place] information on how to pursue placement from home with the assistance of the PCP at [facility].  No further needs identified at this time.

## 2011-08-26 NOTE — ED Notes (Signed)
Pt reports pain starting in his back and working its way around to his left abdomen x2 months. States he uses a walker at home to go to the bathroom. When asked about why he has been on the couch for so long sitting in his urine he states "I have been that way for awhile." Unable to give any explanation. Pt is A/O x4. NAD noted at this time.

## 2011-08-26 NOTE — ED Notes (Signed)
Pt taken to decon room by [facility], NT for cleaning due to sitting in urine for 2 months.

## 2011-08-26 NOTE — Discharge Instructions (Signed)
Alcohol and Nutrition Nutrition serves two purposes. It provides energy. It also maintains body structure and function. Food supplies energy. It also provides the building blocks needed to replace worn or damaged cells. Alcoholics often eat poorly. This limits their supply of essential nutrients. This affects energy supply and structure maintenance. Alcohol also affects the body's nutrients in:  Digestion.   Storage.   Using and getting rid of waste products.  IMPAIRMENT OF NUTRIENT DIGESTION AND UTILIZATION   Once ingested, food must be broken down into small components (digested). Then it is available for energy. It helps maintain body structure and function. Digestion begins in the mouth. It continues in the stomach and intestines, with help from the pancreas. The nutrients from digested food are absorbed from the intestines into the blood. Then they are carried to the liver. The liver prepares nutrients for:   Immediate use.   Storage and future use.   Alcohol inhibits the breakdown of nutrients into usable molecules.   It decreases secretion of digestive enzymes from the pancreas.   Alcohol impairs nutrient absorption by damaging the cells lining the stomach and intestines.   It also interferes with moving some nutrients into the blood.   In addition, nutritional deficiencies themselves may lead to further absorption problems.   For example, folate deficiency changes the cells that line the small intestine. This impairs how water is absorbed. It also affects absorbed nutrients. These include glucose, sodium, and additional folate.   Even if nutrients are digested and absorbed, alcohol can prevent them from being fully used. It changes their transport, storage, and excretion. Impaired utilization of nutrients by alcoholics is indicated by:   Decreased liver stores of vitamins, such as vitamin A.   Increased excretion of nutrients such as fat.  ALCOHOL AND ENERGY SUPPLY   Three  basic nutritional components found in food are:   Carbohydrates.   Proteins.   Fats.   These are used as energy. Some alcoholics take in as much as 50% of their total daily calories from alcohol. They often neglect important foods.   Even when enough food is eaten, alcohol can impair the ways the body controls blood sugar (glucose) levels. It may either increase or decrease blood sugar.   In non-diabetic alcoholics, increased blood sugar (hyperglycemia) is caused by poor insulin secretion. It is usually temporary.   Decreased blood sugar (hypoglycemia) can cause serious injury even if this condition is short-lived. Low blood sugar can happen when a fasting or malnourished person drinks alcohol. When there is no food to supply energy, stored sugar is used up. The products of alcohol inhibit forming glucose from other compounds such as amino acids. As a result, alcohol causes the brain and other body tissue to lack glucose. It is needed for energy and function.   Alcohol is an energy source. But how the body processes and uses the energy from alcohol is complex. Also, when alcohol is substituted for carbohydrates, subjects tend to lose weight. This indicates that they get less energy from alcohol than from food.  ALCOHOL - MAINTAINING CELL STRUCTURE AND FUNCTION  Structure Cells are made mostly of protein. So an adequate protein diet is important for maintaining cell structure. This is especially true if cells are being damaged. Research indicates that alcohol affects protein nutrition by causing impaired:  Digestion of proteins to amino acids.   Processing of amino acids by the small intestine and liver.   Synthesis of proteins from amino acids.   Protein   secretion by the liver.  Function Nutrients are essential for the body to function well. They provide the tools that the body needs to work well:   Proteins.   Vitamins.   Minerals.  Alcohol can disrupt body function. It may cause  nutrient deficiencies. And it may interfere with the way nutrients are processed. Vitamins  Vitamins are essential to maintain growth and normal metabolism. They regulate many of the body`s processes. Chronic heavy drinking causes deficiencies in many vitamins. This is caused by eating less. And, in some cases, vitamins may be poorly absorbed. For example, alcohol inhibits fat absorption. It impairs how the vitamins A, E, and D are normally absorbed along with dietary fats. Not enough vitamin A may cause night blindness. Not enough vitamin D may cause softening of the bones.   Some alcoholics lack vitamins A, C, D, E, K, and the B vitamins. These are all involved in wound healing and cell maintenance. In particular, because vitamin K is necessary for blood clotting, lacking that vitamin can cause delayed clotting. The result is excess bleeding. Lacking other vitamins involved in brain function may cause severe neurological damage.  Minerals Deficiencies of minerals such as calcium, magnesium, iron, and zinc are common in alcoholics. The alcohol itself does not seem to affect how these minerals are absorbed. Rather, they seem to occur secondary to other alcohol-related problems, such as:  Less calcium absorbed.   Not enough magnesium.   More urinary excretion.   Vomiting.   Diarrhea.   Not enough iron due to gastrointestinal bleeding.   Not enough zinc or losses related to other nutrient deficiencies.   Mineral deficiencies can cause a variety of medical consequences. These range from calcium-related bone disease to zinc-related night blindness and skin lesions.  ALCOHOL, MALNUTRITION, AND MEDICAL COMPLICATIONS  Liver Disease   Alcoholic liver damage is caused primarily by alcohol itself. But poor nutrition may increase the risk of alcohol-related liver damage. For example, nutrients normally found in the liver are known to be affected by drinking alcohol. These include carotenoids, which  are the major sources of vitamin A, and vitamin E compounds. Decreases in such nutrients may play some role in alcohol-related liver damage.  Pancreatitis  Research suggests that malnutrition may increase the risk of developing alcoholic pancreatitis. Research suggests that a diet lacking in protein may increase alcohol's damaging effect on the pancreas.  Brain  Nutritional deficiencies may have severe effects on brain function. These may be permanent. Specifically, thiamine deficiencies are often seen in alcoholics. They can cause severe neurological problems. These include:   Impaired movement.   Memory loss seen in Wernicke-Korsakoff syndrome.  Pregnancy  Alcohol has toxic effects on fetal development. It causes alcohol-related birth defects. They include fetal alcohol syndrome. Alcohol itself is toxic to the fetus. Also, the nutritional deficiency can affect how the fetus develops. That may compound the risk of developmental damage.   Nutritional needs during pregnancy are 10% to 30% greater than normal. Food intake can increase by as much as 140% to cover the needs of both mother and fetus. An alcoholic mother`s nutritional problems may adversely affect the nutrition of the fetus. And alcohol itself can also restrict nutrition flow to the fetus.  NUTRITIONAL STATUS OF ALCOHOLICS  Techniques for assessing nutritional status include:  Taking body measurements to estimate fat reserves. They include:   Weight.   Height.   Mass.   Skin fold thickness.   Performing blood analysis to provide measurements of circulating:     Proteins.   Vitamins.   Minerals.   These techniques tend to be imprecise. For many nutrients, there is no clear "cut-off" point that would allow an accurate definition of deficiency. So assessing the nutritional status of alcoholics is limited by these techniques. Dietary status may provide information about the risk of developing nutritional problems. Dietary  status is assessed by:   Taking patients' dietary histories.   Evaluating the amount and types of food they are eating.   It is difficult to determine what exact amount of alcohol begins to have damaging effects on nutrition. In general, moderate drinkers have 2 drinks or less per day. They seem to be at little risk for nutritional problems. Various medical disorders begin to appear at greater levels.   Research indicates that the majority of even the heaviest drinkers have few obvious nutritional deficiencies. Many alcoholics who are hospitalized for medical complications of their disease do have severe malnutrition. Alcoholics tend to eat poorly. Often they eat less than the amounts of food necessary to provide enough:   Carbohydrates.   Protein.   Fat.   Vitamins A and C.   B vitamins.   Minerals like calcium and iron.  Of major concern is alcohol's effect on digesting food and use of nutrients. It may shift a mildly malnourished person toward severe malnutrition. Document Released: 01/30/2005 Document Revised: 03/27/2011 Document Reviewed: 07/16/2005 [employer] Patient Information 2012 [employer]. 

## 2011-12-28 ENCOUNTER — Inpatient Hospital Stay (HOSPITAL_COMMUNITY)
Admission: EM | Admit: 2011-12-28 | Discharge: 2012-01-05 | DRG: 897 | Disposition: A | Discharge status: Home Health | Payer: Medicare PPO | Attending: Internal Medicine | Admitting: Internal Medicine

## 2011-12-28 DIAGNOSIS — Z794 Long term (current) use of insulin: Secondary | ICD-10-CM

## 2011-12-28 DIAGNOSIS — I160 Hypertensive urgency: Secondary | ICD-10-CM | POA: Diagnosis present

## 2011-12-28 DIAGNOSIS — F1020 Alcohol dependence, uncomplicated: Secondary | ICD-10-CM | POA: Diagnosis present

## 2011-12-28 DIAGNOSIS — R5381 Other malaise: Secondary | ICD-10-CM | POA: Diagnosis present

## 2011-12-28 DIAGNOSIS — R29898 Other symptoms and signs involving the musculoskeletal system: Secondary | ICD-10-CM | POA: Diagnosis present

## 2011-12-28 DIAGNOSIS — E119 Type 2 diabetes mellitus without complications: Secondary | ICD-10-CM

## 2011-12-28 DIAGNOSIS — R1310 Dysphagia, unspecified: Secondary | ICD-10-CM | POA: Diagnosis present

## 2011-12-28 DIAGNOSIS — R6510 Systemic inflammatory response syndrome (SIRS) of non-infectious origin without acute organ dysfunction: Secondary | ICD-10-CM

## 2011-12-28 DIAGNOSIS — I2510 Atherosclerotic heart disease of native coronary artery without angina pectoris: Secondary | ICD-10-CM | POA: Diagnosis present

## 2011-12-28 DIAGNOSIS — F10239 Alcohol dependence with withdrawal, unspecified: Principal | ICD-10-CM | POA: Diagnosis present

## 2011-12-28 DIAGNOSIS — E1169 Type 2 diabetes mellitus with other specified complication: Secondary | ICD-10-CM | POA: Diagnosis present

## 2011-12-28 DIAGNOSIS — R197 Diarrhea, unspecified: Secondary | ICD-10-CM | POA: Diagnosis not present

## 2011-12-28 DIAGNOSIS — R5383 Other fatigue: Secondary | ICD-10-CM | POA: Diagnosis present

## 2011-12-28 DIAGNOSIS — E785 Hyperlipidemia, unspecified: Secondary | ICD-10-CM | POA: Diagnosis present

## 2011-12-28 DIAGNOSIS — T3695XA Adverse effect of unspecified systemic antibiotic, initial encounter: Secondary | ICD-10-CM | POA: Diagnosis not present

## 2011-12-28 DIAGNOSIS — R531 Weakness: Secondary | ICD-10-CM | POA: Diagnosis present

## 2011-12-28 DIAGNOSIS — Z87891 Personal history of nicotine dependence: Secondary | ICD-10-CM

## 2011-12-28 DIAGNOSIS — IMO0002 Reserved for concepts with insufficient information to code with codable children: Secondary | ICD-10-CM | POA: Diagnosis not present

## 2011-12-28 DIAGNOSIS — Z882 Allergy status to sulfonamides status: Secondary | ICD-10-CM

## 2011-12-28 DIAGNOSIS — I10 Essential (primary) hypertension: Secondary | ICD-10-CM | POA: Diagnosis present

## 2011-12-28 DIAGNOSIS — D638 Anemia in other chronic diseases classified elsewhere: Secondary | ICD-10-CM | POA: Diagnosis present

## 2011-12-28 DIAGNOSIS — M545 Low back pain, unspecified: Secondary | ICD-10-CM | POA: Diagnosis present

## 2011-12-28 DIAGNOSIS — E876 Hypokalemia: Secondary | ICD-10-CM | POA: Diagnosis not present

## 2011-12-28 DIAGNOSIS — R000 Tachycardia, unspecified: Secondary | ICD-10-CM

## 2011-12-28 DIAGNOSIS — G8929 Other chronic pain: Secondary | ICD-10-CM | POA: Diagnosis present

## 2011-12-28 DIAGNOSIS — F411 Generalized anxiety disorder: Secondary | ICD-10-CM | POA: Diagnosis not present

## 2011-12-28 DIAGNOSIS — E871 Hypo-osmolality and hyponatremia: Secondary | ICD-10-CM | POA: Diagnosis not present

## 2011-12-28 DIAGNOSIS — Z951 Presence of aortocoronary bypass graft: Secondary | ICD-10-CM

## 2011-12-28 DIAGNOSIS — F10939 Alcohol use, unspecified with withdrawal, unspecified: Principal | ICD-10-CM | POA: Diagnosis present

## 2011-12-28 DIAGNOSIS — E1151 Type 2 diabetes mellitus with diabetic peripheral angiopathy without gangrene: Secondary | ICD-10-CM | POA: Diagnosis present

## 2011-12-28 LAB — COMPREHENSIVE METABOLIC PANEL
ALT: 24 U/L (ref 0–53)
AST: 38 U/L — ABNORMAL HIGH (ref 0–37)
Albumin: 3.1 g/dL — ABNORMAL LOW (ref 3.5–5.2)
CO2: 23 mEq/L (ref 19–32)
Chloride: 99 mEq/L (ref 96–112)
GFR calc non Af Amer: 82 mL/min — ABNORMAL LOW (ref >90)
Sodium: 138 mEq/L (ref 135–145)
Total Bilirubin: 0.3 mg/dL (ref 0.3–1.2)

## 2011-12-28 LAB — GLUCOSE, CAPILLARY: Glucose-Capillary: 219 mg/dL — ABNORMAL HIGH (ref 70–99)

## 2011-12-28 LAB — CBC WITH DIFFERENTIAL/PLATELET
Basophils Absolute: 0 10*3/uL (ref 0.0–0.1)
Basophils Relative: 1 % (ref 0–1)
Lymphocytes Relative: 29 % (ref 12–46)
Neutro Abs: 2.8 10*3/uL (ref 1.7–7.7)
Neutrophils Relative %: 60 % (ref 43–77)
Platelets: 228 10*3/uL (ref 150–400)
RDW: 17.6 % — ABNORMAL HIGH (ref 11.5–15.5)
WBC: 4.8 10*3/uL (ref 4.0–10.5)

## 2011-12-28 MED ORDER — SODIUM CHLORIDE 0.9 % IV BOLUS (SEPSIS)
1000.0000 mL | Freq: Once | INTRAVENOUS | Status: AC
  Administered 2011-12-28: 1000 mL via INTRAVENOUS

## 2011-12-28 NOTE — ED Provider Notes (Signed)
History     CSN: [id number]  Arrival date & time 12/28/11  2226   First MD Initiated Contact with Patient 12/28/11 2259      Chief Complaint  Patient presents with  . Weakness    (Consider location/radiation/quality/duration/timing/severity/associated sxs/prior treatment) HPI This is a 77 year old black male with a history of chronic back pain. He has been unable to get up from the couch for the past several days due to weakness. A neighbor found him and called EMS. EMS reports he was covered in feces and urine. The patient states he was simply too weak to get up off the couch. When the neighbor attempted to help him stand he was unable to bear weight. He states his back pain is not as severe as it had been earlier this month. He denies chest pain, abdominal pain, nausea, vomiting or diarrhea. He is noted to be tachycardic. The symptoms are moderate to severe. There are no known exacerbating or mitigating factors. He has not had anything to eat or drink in several days.  Past Medical History  Diagnosis Date  . Acute alcoholic hepatitis 05/20/2010  . Anemia of other chronic disease 08/16/2007  . Atrial fibrillation 08/12/2007  . CORONARY ARTERY DISEASE 12/11/2006  . DIABETES MELLITUS, TYPE II 12/11/2006  . HYPERTENSION 12/11/2006  . Hyperlipidemia   . ETOH abuse   . Chronic low back pain     Past Surgical History  Procedure Date  . Coronary artery bypass graft     Family History  Problem Relation Age of Onset  . Heart failure Mother   . Aneurysm Sister   . Suicidality Brother   . Heart failure Brother   . Diabetes Sister     History  Substance Use Topics  . Smoking status: Former Smoker    Quit date: 04/22/1983  . Smokeless tobacco: Not on file  . Alcohol Use: Yes     hx of ETOH      Review of Systems  All other systems reviewed and are negative.    Allergies  Sulfa antibiotics  Home Medications   Current Outpatient Rx  Name Route Sig Dispense Refill  .  INSULIN GLARGINE 100 UNIT/ML [place] SOLN Subcutaneous Inject 15 Units into the skin at bedtime.      BP 171/93  Pulse 119  Temp 97.7 F (36.5 C) (Oral)  Resp 25  Ht 5\' 9"  (1.753 m)  Wt 200 lb (90.719 kg)  BMI 29.53 kg/m2  SpO2 94%  Physical Exam General: Well-developed, well-nourished male in no acute distress; appearance consistent with age of record; appears disheveled HENT: normocephalic, atraumatic Eyes: pupils equal round and reactive to light; extraocular muscles intact; arcus senilis bilaterally Neck: supple Heart: regular rate and rhythm; tachycardia Lungs: clear to auscultation bilaterally Abdomen: soft; nondistended; nontender; no masses or hepatosplenomegaly; bowel sounds present Extremities: No deformity; full range of motion; +1 edema of ankles and feet, slightly greater on the right Neurologic: Awake, alert; motor function intact in all extremities but generally weak; no facial droop Skin: Warm and dry Psychiatric: Flat    ED Course  Procedures (including critical care time)     MDM   Nursing notes and vitals signs, including pulse oximetry, reviewed.  Summary of this visit's results, reviewed by myself:  Labs:  Results for orders placed during the hospital encounter of 12/28/11  CBC WITH DIFFERENTIAL      Component Value Range   WBC 4.8  4.0 - 10.5 K/uL   RBC 3.88 (*)  4.22 - 5.81 MIL/uL   Hemoglobin 12.9 (*) 13.0 - 17.0 g/dL   HCT 16.1 (*) 09.6 - 04.5 %   MCV 94.3  78.0 - 100.0 fL   MCH 33.2  26.0 - 34.0 pg   MCHC 35.2  30.0 - 36.0 g/dL   RDW 40.9 (*) 81.1 - 91.4 %   Platelets 228  150 - 400 K/uL   Neutrophils Relative 60  43 - 77 %   Neutro Abs 2.8  1.7 - 7.7 K/uL   Lymphocytes Relative 29  12 - 46 %   Lymphs Abs 1.4  0.7 - 4.0 K/uL   Monocytes Relative 9  3 - 12 %   Monocytes Absolute 0.4  0.1 - 1.0 K/uL   Eosinophils Relative 2  0 - 5 %   Eosinophils Absolute 0.1  0.0 - 0.7 K/uL   Basophils Relative 1  0 - 1 %   Basophils Absolute 0.0  0.0 -  0.1 K/uL  COMPREHENSIVE METABOLIC PANEL      Component Value Range   Sodium 138  135 - 145 mEq/L   Potassium 3.5  3.5 - 5.1 mEq/L   Chloride 99  96 - 112 mEq/L   CO2 23  19 - 32 mEq/L   Glucose, Bld 232 (*) 70 - 99 mg/dL   BUN 6  6 - 23 mg/dL   Creatinine, Ser 7.82  0.50 - 1.35 mg/dL   Calcium 8.5  8.4 - 95.6 mg/dL   Total Protein 7.9  6.0 - 8.3 g/dL   Albumin 3.1 (*) 3.5 - 5.2 g/dL   AST 38 (*) 0 - 37 U/L   ALT 24  0 - 53 U/L   Alkaline Phosphatase 176 (*) 39 - 117 U/L   Total Bilirubin 0.3  0.3 - 1.2 mg/dL   GFR calc non Af Amer 82 (*) >90 mL/min   GFR calc Af Amer >90  >90 mL/min  URINALYSIS, ROUTINE W REFLEX MICROSCOPIC      Component Value Range   Color, Urine YELLOW  YELLOW   APPearance CLEAR  CLEAR   Specific Gravity, Urine 1.018  1.005 - 1.030   pH 5.0  5.0 - 8.0   Glucose, UA NEGATIVE  NEGATIVE mg/dL   Hgb urine dipstick NEGATIVE  NEGATIVE   Bilirubin Urine NEGATIVE  NEGATIVE   Ketones, ur NEGATIVE  NEGATIVE mg/dL   Protein, ur 30 (*) NEGATIVE mg/dL   Urobilinogen, UA 0.2  0.0 - 1.0 mg/dL   Nitrite NEGATIVE  NEGATIVE   Leukocytes, UA NEGATIVE  NEGATIVE  GLUCOSE, CAPILLARY      Component Value Range   Glucose-Capillary 219 (*) 70 - 99 mg/dL  URINE MICROSCOPIC-ADD ON      Component Value Range   WBC, UA 0-2  <3 WBC/hpf  TROPONIN I      Component Value Range   Troponin I <0.30  <0.30 ng/mL    Imaging Studies: No results found.    EKG Interpretation:  Date & Time: 12/28/2011 10:55 PM  Rate: 129  Rhythm: sinus tachycardia  QRS Axis: normal  Intervals: normal  ST/T Wave abnormalities: normal  Conduction Disutrbances:right bundle branch block  Narrative Interpretation:   Old EKG Reviewed: Rate is faster         Hanley Seamen, MD 12/29/11 [phone]

## 2011-12-28 NOTE — ED Notes (Signed)
As per EMS pt c/o of back pain and imobile for last month. Pt is unable to care for self. Pt was removed from couch cover in feces and urine by EMS. Friend at bedside.

## 2011-12-28 NOTE — ED Notes (Signed)
[id number]> Expected date:<BR> Expected time:<BR> Means of arrival:<BR> Comments:<BR>  Rm 19, Medic 251, 77 M, Hypertension, Back Pain

## 2011-12-29 ENCOUNTER — Inpatient Hospital Stay (HOSPITAL_COMMUNITY): Payer: Medicare PPO

## 2011-12-29 ENCOUNTER — Encounter (HOSPITAL_COMMUNITY): Payer: Self-pay

## 2011-12-29 DIAGNOSIS — F10239 Alcohol dependence with withdrawal, unspecified: Principal | ICD-10-CM | POA: Diagnosis present

## 2011-12-29 DIAGNOSIS — R531 Weakness: Secondary | ICD-10-CM | POA: Diagnosis present

## 2011-12-29 DIAGNOSIS — R5383 Other fatigue: Secondary | ICD-10-CM

## 2011-12-29 DIAGNOSIS — I10 Essential (primary) hypertension: Secondary | ICD-10-CM

## 2011-12-29 DIAGNOSIS — F10939 Alcohol use, unspecified with withdrawal, unspecified: Principal | ICD-10-CM

## 2011-12-29 DIAGNOSIS — R5381 Other malaise: Secondary | ICD-10-CM

## 2011-12-29 DIAGNOSIS — R29898 Other symptoms and signs involving the musculoskeletal system: Secondary | ICD-10-CM

## 2011-12-29 DIAGNOSIS — I160 Hypertensive urgency: Secondary | ICD-10-CM | POA: Diagnosis present

## 2011-12-29 LAB — GLUCOSE, CAPILLARY
Glucose-Capillary: 176 mg/dL — ABNORMAL HIGH (ref 70–99)
Glucose-Capillary: 152 mg/dL — ABNORMAL HIGH (ref 70–99)
Glucose-Capillary: 209 mg/dL — ABNORMAL HIGH (ref 70–99)
Glucose-Capillary: 182 mg/dL — ABNORMAL HIGH (ref 70–99)
Glucose-Capillary: 177 mg/dL — ABNORMAL HIGH (ref 70–99)

## 2011-12-29 LAB — BASIC METABOLIC PANEL
CO2: 19 mEq/L (ref 19–32)
Chloride: 105 mEq/L (ref 96–112)
Sodium: 142 mEq/L (ref 135–145)

## 2011-12-29 LAB — RAPID URINE DRUG SCREEN, HOSP PERFORMED
Barbiturates: NOT DETECTED
Cocaine: NOT DETECTED
Tetrahydrocannabinol: NOT DETECTED

## 2011-12-29 LAB — URINALYSIS, ROUTINE W REFLEX MICROSCOPIC
Bilirubin Urine: NEGATIVE
Glucose, UA: NEGATIVE mg/dL
Hgb urine dipstick: NEGATIVE
Protein, ur: 30 mg/dL — AB
Specific Gravity, Urine: 1.018 (ref 1.005–1.030)
Urobilinogen, UA: 0.2 mg/dL (ref 0.0–1.0)

## 2011-12-29 LAB — CBC
HCT: 33.2 % — ABNORMAL LOW (ref 39.0–52.0)
MCV: 94.9 fL (ref 78.0–100.0)
RBC: 3.5 MIL/uL — ABNORMAL LOW (ref 4.22–5.81)
WBC: 6 10*3/uL (ref 4.0–10.5)

## 2011-12-29 LAB — MAGNESIUM: Magnesium: 0.9 mg/dL — CL (ref 1.5–2.5)

## 2011-12-29 LAB — URINE MICROSCOPIC-ADD ON

## 2011-12-29 LAB — FOLATE: Folate: 2.2 ng/mL — ABNORMAL LOW

## 2011-12-29 MED ORDER — VITAMIN B-1 100 MG PO TABS
100.0000 mg | ORAL_TABLET | Freq: Every day | ORAL | Status: DC
  Administered 2011-12-29 – 2012-01-05 (×8): 100 mg via ORAL
  Filled 2011-12-29 (×8): qty 1

## 2011-12-29 MED ORDER — FOLIC ACID 1 MG PO TABS
1.0000 mg | ORAL_TABLET | Freq: Every day | ORAL | Status: DC
  Administered 2011-12-29 – 2012-01-05 (×8): 1 mg via ORAL
  Filled 2011-12-29 (×8): qty 1

## 2011-12-29 MED ORDER — LORAZEPAM 2 MG/ML IJ SOLN
1.0000 mg | Freq: Once | INTRAMUSCULAR | Status: AC
  Administered 2011-12-29: 1 mg via INTRAVENOUS
  Filled 2011-12-29: qty 1

## 2011-12-29 MED ORDER — ACETAMINOPHEN 325 MG PO TABS
650.0000 mg | ORAL_TABLET | Freq: Four times a day (QID) | ORAL | Status: DC | PRN
  Administered 2011-12-30: 650 mg via ORAL
  Filled 2011-12-29: qty 2

## 2011-12-29 MED ORDER — ALUM & MAG HYDROXIDE-SIMETH 200-200-20 MG/5ML PO SUSP: 30.0000 mL | Freq: Four times a day (QID) | ORAL | Status: DC | PRN

## 2011-12-29 MED ORDER — INSULIN ASPART 100 UNIT/ML ~~LOC~~ SOLN
0.0000 [IU] | SUBCUTANEOUS | Status: DC
  Administered 2011-12-29 (×3): 2 [IU] via SUBCUTANEOUS
  Administered 2011-12-29 (×2): 3 [IU] via SUBCUTANEOUS
  Administered 2011-12-30: 1 [IU] via SUBCUTANEOUS
  Administered 2011-12-30: 2 [IU] via SUBCUTANEOUS
  Administered 2011-12-30: 3 [IU] via SUBCUTANEOUS
  Administered 2011-12-31 – 2012-01-04 (×7): 1 [IU] via SUBCUTANEOUS

## 2011-12-29 MED ORDER — ADULT MULTIVITAMIN W/MINERALS CH
1.0000 | ORAL_TABLET | Freq: Every day | ORAL | Status: DC
  Administered 2011-12-29 – 2012-01-05 (×8): 1 via ORAL
  Filled 2011-12-29 (×8): qty 1

## 2011-12-29 MED ORDER — MAGNESIUM SULFATE 40 MG/ML IJ SOLN
2.0000 g | Freq: Once | INTRAMUSCULAR | Status: AC
  Administered 2011-12-29: 2 g via INTRAVENOUS
  Filled 2011-12-29: qty 50

## 2011-12-29 MED ORDER — OXYCODONE HCL 5 MG PO TABS
5.0000 mg | ORAL_TABLET | ORAL | Status: DC | PRN
  Administered 2012-01-05: 5 mg via ORAL
  Filled 2011-12-29: qty 1

## 2011-12-29 MED ORDER — INSULIN GLARGINE 100 UNIT/ML ~~LOC~~ SOLN
15.0000 [IU] | Freq: Every day | SUBCUTANEOUS | Status: DC
  Administered 2011-12-29 – 2012-01-02 (×5): 15 [IU] via SUBCUTANEOUS

## 2011-12-29 MED ORDER — ZOLPIDEM TARTRATE 5 MG PO TABS: 5.0000 mg | ORAL_TABLET | Freq: Every evening | ORAL | Status: DC | PRN

## 2011-12-29 MED ORDER — LORAZEPAM 2 MG/ML IJ SOLN
0.0000 mg | Freq: Two times a day (BID) | INTRAMUSCULAR | Status: AC
  Administered 2011-12-31: 1 mg via INTRAVENOUS
  Administered 2011-12-31: 2 mg via INTRAVENOUS
  Administered 2011-12-31: 1 mg via INTRAVENOUS

## 2011-12-29 MED ORDER — ONDANSETRON HCL 4 MG/2ML IJ SOLN
4.0000 mg | Freq: Four times a day (QID) | INTRAMUSCULAR | Status: DC | PRN
  Administered 2011-12-30: 4 mg via INTRAVENOUS
  Filled 2011-12-29 (×2): qty 2

## 2011-12-29 MED ORDER — LORAZEPAM 1 MG PO TABS: 1.0000 mg | ORAL_TABLET | Freq: Four times a day (QID) | ORAL | Status: AC | PRN

## 2011-12-29 MED ORDER — SODIUM CHLORIDE 0.9 % IV BOLUS (SEPSIS)
1000.0000 mL | Freq: Once | INTRAVENOUS | Status: AC
  Administered 2011-12-29: 1000 mL via INTRAVENOUS

## 2011-12-29 MED ORDER — SODIUM CHLORIDE 0.9 % IJ SOLN
3.0000 mL | Freq: Two times a day (BID) | INTRAMUSCULAR | Status: DC
  Administered 2012-01-04: 3 mL via INTRAVENOUS

## 2011-12-29 MED ORDER — ONDANSETRON HCL 4 MG PO TABS: 4.0000 mg | ORAL_TABLET | Freq: Four times a day (QID) | ORAL | Status: DC | PRN

## 2011-12-29 MED ORDER — HYDRALAZINE HCL 20 MG/ML IJ SOLN
10.0000 mg | INTRAMUSCULAR | Status: DC | PRN
  Administered 2011-12-29 – 2012-01-04 (×10): 10 mg via INTRAVENOUS
  Filled 2011-12-29 (×11): qty 1

## 2011-12-29 MED ORDER — ENOXAPARIN SODIUM 40 MG/0.4ML ~~LOC~~ SOLN
40.0000 mg | SUBCUTANEOUS | Status: DC
  Administered 2011-12-29 – 2012-01-04 (×7): 40 mg via SUBCUTANEOUS
  Filled 2011-12-29 (×7): qty 0.4

## 2011-12-29 MED ORDER — METOPROLOL TARTRATE 1 MG/ML IV SOLN
5.0000 mg | Freq: Three times a day (TID) | INTRAVENOUS | Status: DC | PRN
  Administered 2011-12-29: 5 mg via INTRAVENOUS
  Filled 2011-12-29: qty 5

## 2011-12-29 MED ORDER — METOPROLOL TARTRATE 50 MG PO TABS
50.0000 mg | ORAL_TABLET | Freq: Two times a day (BID) | ORAL | Status: DC
  Administered 2011-12-29 – 2012-01-01 (×6): 50 mg via ORAL
  Filled 2011-12-29 (×7): qty 1

## 2011-12-29 MED ORDER — METOPROLOL TARTRATE 1 MG/ML IV SOLN
INTRAVENOUS | Status: AC
  Filled 2011-12-29: qty 5

## 2011-12-29 MED ORDER — AMLODIPINE BESYLATE 10 MG PO TABS
10.0000 mg | ORAL_TABLET | Freq: Every day | ORAL | Status: DC
  Administered 2011-12-29 – 2011-12-31 (×3): 10 mg via ORAL
  Filled 2011-12-29 (×3): qty 1

## 2011-12-29 MED ORDER — THIAMINE HCL 100 MG/ML IJ SOLN
Freq: Once | INTRAVENOUS | Status: AC
  Administered 2011-12-29: 05:00:00 via INTRAVENOUS
  Filled 2011-12-29: qty 1000

## 2011-12-29 MED ORDER — LORAZEPAM 2 MG/ML IJ SOLN
0.0000 mg | Freq: Four times a day (QID) | INTRAMUSCULAR | Status: AC
Start: 2011-12-29 — End: 2011-12-30
  Administered 2011-12-29 (×2): 1 mg via INTRAVENOUS
  Administered 2011-12-29 (×2): 2 mg via INTRAVENOUS
  Administered 2011-12-30 (×4): 1 mg via INTRAVENOUS
  Filled 2011-12-29 (×5): qty 1

## 2011-12-29 MED ORDER — ACETAMINOPHEN 650 MG RE SUPP: 650.0000 mg | Freq: Four times a day (QID) | RECTAL | Status: DC | PRN

## 2011-12-29 MED ORDER — THIAMINE HCL 100 MG/ML IJ SOLN
100.0000 mg | Freq: Every day | INTRAMUSCULAR | Status: DC
  Filled 2011-12-29 (×8): qty 1

## 2011-12-29 MED ORDER — LORAZEPAM 2 MG/ML IJ SOLN
1.0000 mg | Freq: Four times a day (QID) | INTRAMUSCULAR | Status: AC | PRN
  Filled 2011-12-29 (×6): qty 1

## 2011-12-29 MED ORDER — FENTANYL CITRATE 0.05 MG/ML IJ SOLN
100.0000 ug | Freq: Once | INTRAMUSCULAR | Status: AC
  Administered 2011-12-29: 100 ug via INTRAVENOUS
  Filled 2011-12-29: qty 2

## 2011-12-29 MED ORDER — HYDROMORPHONE HCL PF 1 MG/ML IJ SOLN
0.5000 mg | INTRAMUSCULAR | Status: DC | PRN
  Administered 2011-12-29 – 2011-12-31 (×2): 1 mg via INTRAVENOUS
  Filled 2011-12-29 (×2): qty 1

## 2011-12-29 MED ORDER — METOPROLOL TARTRATE 1 MG/ML IV SOLN
5.0000 mg | Freq: Once | INTRAVENOUS | Status: AC
  Administered 2011-12-29: 5 mg via INTRAVENOUS

## 2011-12-29 NOTE — Progress Notes (Signed)
CRITICAL VALUE ALERT  Critical value received:  Mg 0.9  Date of notification:  12/29/11   Time of notification: 10:02  Critical value read back:yes  Nurse who received alert:  Clovia Cuff   MD notified (1st page):  Cena Benton  Time of first page:  10:05  MD notified (2nd page):  Time of second page:  Responding MD:  Cena Benton  Time MD responded:  10:07

## 2011-12-29 NOTE — ED Notes (Signed)
MD at bedside. 

## 2011-12-29 NOTE — Progress Notes (Signed)
PT Cancellation Note  Evaluation cancelled today due to pt declining therapy today.  Pt declined OOB to chair despite encouragement.  Will check as schedule permits.  Markelle Najarian,KATHrine E 12/29/2011, 2:57 PM Pager: [phone]
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
## 2011-12-29 NOTE — Progress Notes (Addendum)
Patient was seen and evaluated this AM by my associate please review her H and P for assessment and Plan I agree, with the following additions.  Patient strength equal when testing ability of toes to flex and extend testing S1, also sensation to light touch over inner thighs was intact BL, He denies any bowel or bladder incontinence.   There fore will defer MRI of lumbar spine cauda equina syndrome less likely given the above findings.  Will obtain x ray of lower back lumbar back. Order physical therapy to evaluate and treat. Obtain further lab work for general weakness.  Johnny [name]  Addendum:  Patient has developed elevated blood pressures ranging from 202/78-224/108 did not have any prn blood pressure medication ordered.  Will order hydralazine.  Also obtain a UDS as patient is alcohol dependent and I would like to avoid any beta blockers if patient has concurrent cocaine use (which I have no evidence of but prefer to make sure he does not have this so that I can use lopressor as a prn blood pressure order for improved blood pressure control) - Have added amlodipine as well - consider b blocker once UDS results back  Hypomagnesemia - Probably related to poor oral intake and h/o alcohol dependence. - Place order for magnesium replacement IV and reassess next am. - May be contributing to generalized weakness  Have spent > 40 minutes evaluating and placing further orders for this patient.

## 2011-12-30 DIAGNOSIS — R6510 Systemic inflammatory response syndrome (SIRS) of non-infectious origin without acute organ dysfunction: Secondary | ICD-10-CM

## 2011-12-30 LAB — CBC
HCT: 36.4 % — ABNORMAL LOW (ref 39.0–52.0)
Platelets: 180 10*3/uL (ref 150–400)
RBC: 3.86 MIL/uL — ABNORMAL LOW (ref 4.22–5.81)
RDW: 17.6 % — ABNORMAL HIGH (ref 11.5–15.5)
WBC: 16.1 10*3/uL — ABNORMAL HIGH (ref 4.0–10.5)

## 2011-12-30 LAB — GLUCOSE, CAPILLARY
Glucose-Capillary: 123 mg/dL — ABNORMAL HIGH (ref 70–99)
Glucose-Capillary: 113 mg/dL — ABNORMAL HIGH (ref 70–99)

## 2011-12-30 LAB — BASIC METABOLIC PANEL
Chloride: 95 mEq/L — ABNORMAL LOW (ref 96–112)
Creatinine, Ser: 0.69 mg/dL (ref 0.50–1.35)
GFR calc Af Amer: >90 mL/min (ref >90)
Sodium: 134 mEq/L — ABNORMAL LOW (ref 135–145)

## 2011-12-30 MED ORDER — MAGNESIUM SULFATE 40 MG/ML IJ SOLN
2.0000 g | Freq: Once | INTRAMUSCULAR | Status: AC
  Administered 2011-12-30: 2 g via INTRAVENOUS
  Filled 2011-12-30: qty 50

## 2011-12-30 MED ORDER — HYDRALAZINE HCL 20 MG/ML IJ SOLN
10.0000 mg | Freq: Once | INTRAMUSCULAR | Status: AC
  Administered 2011-12-30: 10 mg via INTRAVENOUS

## 2011-12-30 MED ORDER — SODIUM CHLORIDE 0.9 % IV SOLN
INTRAVENOUS | Status: DC
  Administered 2011-12-30 – 2011-12-31 (×2): via INTRAVENOUS

## 2011-12-30 MED ORDER — ENSURE COMPLETE PO LIQD
237.0000 mL | Freq: Two times a day (BID) | ORAL | Status: DC
  Administered 2011-12-30 – 2011-12-31 (×2): 237 mL via ORAL

## 2011-12-30 MED ORDER — LEVOFLOXACIN 500 MG PO TABS
500.0000 mg | ORAL_TABLET | Freq: Every day | ORAL | Status: DC
  Administered 2011-12-30 – 2012-01-04 (×6): 500 mg via ORAL
  Filled 2011-12-30 (×7): qty 1

## 2011-12-30 NOTE — Evaluation (Signed)
Physical Therapy Evaluation Patient Details Name: Johnny [name] MRN: [id number] DOB: January 30, 1935 Today's Date: 12/30/2011 Time: 9562-1308 PT Time Calculation (min): 21 min  PT Assessment / Plan / Recommendation Clinical Impression  77 yo male admitted with weakness, inability to ambulate. Pt somewhat lethergic during evaluation. Demonstrates weakness, poor activity tolerance, impaired posture and balance. Recommend ST rehab at SNF to improve functional mobility.     PT Assessment  Patient needs continued PT services    Follow Up Recommendations  Skilled nursing facility    Barriers to Discharge        Equipment Recommendations  Defer to next venue    Recommendations for Other Services OT consult   Frequency Min 3X/week    Precautions / Restrictions Precautions Precautions: Fall Restrictions Weight Bearing Restrictions: No   Pertinent Vitals/Pain       Mobility  Bed Mobility Bed Mobility: Supine to Sit Supine to Sit: HOB elevated;With rails;3: Mod assist Details for Bed Mobility Assistance: Multimodal cues for safety, technique, hand placement. Assist for bil LEs off/onto bed and for trunk to upright/supine. Utilized bedpad to assist with positioning, scooting, maneuvering. Increased time.  Transfers Transfers: Sit to Stand;Stand to Sit Sit to Stand: 2: Max assist;From bed;From elevated surface;With upper extremity assist Stand to Sit: 3: Mod assist;With upper extremity assist;To bed Details for Transfer Assistance: Multimodal cues for safety, posture, hand placement. 2 attempts to acheive standing position. Pt maintains flexed posturing (hips, knees). Assist to rise, stabilize, control descent.  Ambulation/Gait Ambulation/Gait Assistance: Not tested (comment) Ambulation/Gait Assistance Details: Pt too weak/fatigued to attempt ambulation with +1 assist on eval. 3-4 side steps to [facility] with RW-Mod A to stabilize, maneuver with RW. Cues/facilitation to encourage extension.       Exercises     PT Diagnosis: Difficulty walking;Generalized weakness;Altered mental status  PT Problem List: Decreased strength;Decreased activity tolerance;Decreased balance;Decreased mobility;Decreased cognition;Decreased knowledge of use of DME PT Treatment Interventions: DME instruction;Gait training;Functional mobility training;Therapeutic activities;Therapeutic exercise;Balance training;Patient/family education   PT Goals Acute Rehab PT Goals PT Goal Formulation: Patient unable to participate in goal setting Time For Goal Achievement: 01/13/12 Potential to Achieve Goals: Fair Pt will go Supine/Side to Sit: with supervision PT Goal: Supine/Side to Sit - Progress: Goal set today Pt will go Sit to Supine/Side: with supervision PT Goal: Sit to Supine/Side - Progress: Goal set today Pt will go Sit to Stand: with min assist PT Goal: Sit to Stand - Progress: Goal set today Pt will Transfer Bed to Chair/Chair to Bed: with min assist PT Transfer Goal: Bed to Chair/Chair to Bed - Progress: Goal set today Pt will Ambulate: 51 - 150 feet;with min assist;with least restrictive assistive device PT Goal: Ambulate - Progress: Goal set today  Visit Information  Last PT Received On: 12/30/11 Assistance Needed: +2    Subjective Data  Subjective: "I'm alright" Patient Stated Goal: None stated   Prior Functioning  Home Living Lives With:  (nephews) Type of Home: House Home Access: Stairs to enter [employer] of Steps: 4 Entrance Stairs-Rails: Right Home Layout: One level Home Adaptive Equipment: Walker - rolling;Straight cane Prior Function Comments: unsure of PLOF. Pt unable to state at time of eval. Family not present. "Friend" is present and gave home environment information Communication Communication: Expressive difficulties    Cognition  Overall Cognitive Status: Impaired Area of Impairment: Attention;Memory;Following commands Arousal/Alertness:  Lethargic Orientation Level: Disoriented to;Time Behavior During Session: Lethargic Attention - Other Comments: difficulty remaining awake initially, but improved as session progressed.  Memory Deficits: Pt unable to provide home environemnt/PLOF information Following Commands: Follows one step commands with increased time;Follows one step commands inconsistently    Extremity/Trunk Assessment Right Lower Extremity Assessment RLE ROM/Strength/Tone: Deficits;Unable to fully assess;Due to impaired cognition RLE ROM/Strength/Tone Deficits: Decreased knee extension ~15-20 degrees. Knee ext strength 3+/5.  Left Lower Extremity Assessment LLE ROM/Strength/Tone: Deficits;Unable to fully assess;Due to impaired cognition LLE ROM/Strength/Tone Deficits: Decreased knee extension ~15-20 degrees. Knee ext strength 3+/5.  Trunk Assessment Trunk Assessment: Kyphotic   Balance Balance Balance Assessed: Yes Static Sitting Balance Static Sitting - Balance Support: Bilateral upper extremity supported;Feet supported Static Sitting - Level of Assistance: 4: Min assist Static Sitting - Comment/# of Minutes: sat EOB 4-5 minutes.  Static Standing Balance Static Standing - Balance Support: Bilateral upper extremity supported;During functional activity Static Standing - Level of Assistance: 3: Mod assist  End of Session PT - End of Session Equipment Utilized During Treatment: Gait belt Activity Tolerance: Patient limited by fatigue Patient left: in bed;with call bell/phone within reach;with bed alarm set  GP     Johnny [name] [facility] 12/30/2011, 1:36 PM [phone]
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
## 2011-12-30 NOTE — Progress Notes (Signed)
INITIAL ADULT NUTRITION ASSESSMENT Date: 12/30/2011   Time: 9:36 AM Reason for Assessment: Consult for Assessment of status and needs.  ASSESSMENT: Male 77 y.o.  Dx: Weakness generalized, ETOH withdrawal, HTN urgency, SIRS  Hx:  Past Medical History  Diagnosis Date  . Acute alcoholic hepatitis 05/20/2010  . Anemia of other chronic disease 08/16/2007  . Atrial fibrillation 08/12/2007  . CORONARY ARTERY DISEASE 12/11/2006  . DIABETES MELLITUS, TYPE II 12/11/2006  . HYPERTENSION 12/11/2006  . Hyperlipidemia   . ETOH abuse   . Chronic low back pain    Past Surgical History  Procedure Date  . Coronary artery bypass graft     Related Meds:     . amLODipine  10 mg Oral Daily  . enoxaparin (LOVENOX) injection  40 mg Subcutaneous Q24H  . folic acid  1 mg Oral Daily  . hydrALAZINE  10 mg Intravenous Once  . insulin aspart  0-9 Units Subcutaneous Q4H  . insulin glargine  15 Units Subcutaneous QHS  . levofloxacin  500 mg Oral Daily  . LORazepam  0-4 mg Intravenous Q6H   Followed by  . LORazepam  0-4 mg Intravenous Q12H  . magnesium sulfate 1 - 4 g bolus IVPB  2 g Intravenous Once  . magnesium sulfate 1 - 4 g bolus IVPB  2 g Intravenous Once  . metoprolol tartrate  50 mg Oral BID  . multivitamin with minerals  1 tablet Oral Daily  . sodium chloride  3 mL Intravenous Q12H  . thiamine  100 mg Oral Daily   Or  . thiamine  100 mg Intravenous Daily     Ht: 5\' 9"  (175.3 cm)  Wt: 173 lb 4.5 oz (78.6 kg) (bedscale)  Ideal Wt: 72.7 kg % Ideal Wt: 108  Usual Wt: 184# per pt Wt Readings from Last 10 Encounters:  12/29/11 173 lb 4.5 oz (78.6 kg)  04/28/11 184 lb 15.5 oz (83.9 kg)  11/22/10 183 lb (83.008 kg)  08/16/10 186 lb (84.369 kg)  06/17/10 184 lb (83.462 kg)  05/20/10 185 lb (83.915 kg)  10/09/09 181 lb (82.101 kg)  10/24/08 191 lb (86.637 kg)  09/25/08 190 lb (86.183 kg)  07/13/08 196 lb (88.905 kg)    % Usual Wt: 94% of weight 8 months ago  Body mass index is 25.59  kg/(m^2).  Labs:  CMP     Component Value Date/Time   NA 134* 12/30/2011 0425   K 3.6 12/30/2011 0425   CL 95* 12/30/2011 0425   CO2 25 12/30/2011 0425   GLUCOSE 106* 12/30/2011 0425   GLUCOSE 93 04/23/2006 0000   BUN 8 12/30/2011 0425   CREATININE 0.69 12/30/2011 0425   CALCIUM 8.8 12/30/2011 0425   PROT 7.9 12/28/2011 2251   ALBUMIN 3.1* 12/28/2011 2251   AST 38* 12/28/2011 2251   ALT 24 12/28/2011 2251   ALKPHOS 176* 12/28/2011 2251   BILITOT 0.3 12/28/2011 2251   GFRNONAA >90 12/30/2011 0425   GFRAA >90 12/30/2011 0425    I/O last 3 completed shifts: In: 250 [I.V.:250] Out: 1701 [Urine:1700; Stool:1]     Diet Order: Carb Control  Supplements/Tube Feeding:  none  IVF:    sodium chloride    Estimated Nutritional Needs:   Kcal: 1800-2000 Protein: 90-105 gm Fluid: >1.8L  Food/Nutrition Related Hx: Pt reports not eating well prior to admit.  Pt admit for ETOH abuse.  Pt meets criteria for malnutrition with <75% intake over the past month and decreased muscle mass.  Intake currently is poor.  Pt did not want lunch-just Ensure.  NUTRITION DIAGNOSIS: -Inadequate oral intake (NI-2.1).  Status: Ongoing  RELATED TO: ETOH use and withdrawal  AS EVIDENCE BY: observation and pt report  MONITORING/EVALUATION(Goals): Intake >90% meals and supplements Monitor:  Intake, labs, weight  EDUCATION NEEDS: -No education needs identified at this time  INTERVENTION: Ensure Complete bid  Dietitian [phone]  DOCUMENTATION CODES Per approved criteria  -Non-severe (moderate) malnutrition in the context of chronic illness    Jeoffrey Massed 12/30/2011, 9:36 AM

## 2011-12-31 DIAGNOSIS — E119 Type 2 diabetes mellitus without complications: Secondary | ICD-10-CM

## 2011-12-31 LAB — GLUCOSE, CAPILLARY
Glucose-Capillary: 125 mg/dL — ABNORMAL HIGH (ref 70–99)
Glucose-Capillary: 97 mg/dL (ref 70–99)
Glucose-Capillary: 90 mg/dL (ref 70–99)
Glucose-Capillary: 97 mg/dL (ref 70–99)

## 2011-12-31 MED ORDER — DEXTROSE-NACL 5-0.45 % IV SOLN
INTRAVENOUS | Status: DC
  Administered 2011-12-31: 20:00:00 via INTRAVENOUS

## 2011-12-31 NOTE — Progress Notes (Addendum)
[facility] PROGRESS NOTE  Johnny [name] NWG:[id number] DOB: [birth year] DOA: 12/28/2011 PCP: Rogelia Boga, MD  Assessment/Plan: Principal Problem:  *Weakness generalized Active Problems:  DIABETES MELLITUS, TYPE II  Anemia of other chronic disease  HYPERTENSION  Tachycardia  Lower extremity weakness  Alcohol withdrawal  Hypertensive urgency SIRs  1. Alcohol Withdrawal - Currently on CIWA protocol and ativan will be administered.  Patient still shaky but improving. - Continue to monitor closely  2. Accelerated Hypertension - amlodipine to be continue when taking PO -continue IV hydralazine and metoprolol. -Withdrawal from ETOH also contributing. -Will follow VS.  3. SIRs - Pt is non toxic appearing and this occurred at day two of admission.  Suspect that this is most likely due to his alcohol withdrawal, there is no specific source of infection currently. - Will order blood cultures, Urinalysis, chest xray - Will cover with levaquin and decide if he will require further antibiotic therapy base on results.  4. Weakness  - Multifactorial likely related to deconditioning, Alcohol dependence, and hypomagnesemia/hyponatremia. - Physical therapy to eval and treat; will most likely required short term SNF  5. Hypomagnesemia - IV magnesium replacement today 9/10.  Received 2 grams 9/09 -follow Mg level  6. DM - At this point will plan on continuing current drug regimen once able to take PO.  7. Weakness - Seemed generalized.  - X ray of lumbar spine yesterday due to c/o lower back discomfort showed no significant change or acute findings. - Patient with dry mucous membranes will continue IVf's; no that patient is NPO will change to D5 1/2 NS today.  Code Status: Full Family Communication: Spoke with patient  Disposition Plan: Pending clinical improvement   Brief narrative: 76 y/o admitted for generalized weakness.  Presenting with alcohol withdrawal.   Currently on CIWA protocol.  Currently awaiting Physical therapy evaluation.  Has SIRs criteria but no active infection and presumed to be secondary to alcohol withdrawal but further work up pending.  Consultants:  None  Procedures:  X ray of lumbar spine  Antibiotics:  levaquin 9/10  HPI/Subjective: Patient lethargic and somnolent; also with some asp concerns during breakfast.   Objective: Filed Vitals:   12/31/11 1322 12/31/11 1800 12/31/11 1852 12/31/11 2018  BP: 150/77 186/104 167/75 162/79  Pulse: 99 92  101  Temp: 97.7 F (36.5 C)   97.7 F (36.5 C)  TempSrc: Oral   Oral  Resp: 20   21  Height:      Weight:      SpO2: 97%   98%    Intake/Output Summary (Last 24 hours) at 12/31/11 2345 Last data filed at 12/31/11 1559  Gross per 24 hour  Intake 1933.75 ml  Output      0 ml  Net 1933.75 ml   Filed Weights   12/28/11 2231 12/29/11 0428  Weight: 90.719 kg (200 lb) 78.6 kg (173 lb 4.5 oz)    Exam:   General:  Pt lethargic and somnolent; having difficulty to follow commands properly or to swallow meds safely.  Cardiovascular: RRR, No MRG  Respiratory: CTA BL, no wheezes  Abdomen: Soft, NT, ND  Data Reviewed: Basic Metabolic Panel:  Lab 12/30/11 5784 12/29/11 0120 12/28/11 2257 12/28/11 2251  NA 134* 142 -- 138  K 3.6 3.5 -- 3.5  CL 95* 105 -- 99  CO2 25 19 -- 23  GLUCOSE 106* 205* -- 232*  BUN 8 6 -- 6  CREATININE 0.69 0.83 -- 0.89  CALCIUM 8.8 7.9* --  8.5  MG 1.3* -- 0.9* --  PHOS -- -- 2.8 --   Liver Function Tests:  Lab 12/28/11 2251  AST 38*  ALT 24  ALKPHOS 176*  BILITOT 0.3  PROT 7.9  ALBUMIN 3.1*   CBC:  Lab 12/30/11 0425 12/29/11 0120 12/28/11 2251  WBC 16.1* 6.0 4.8  NEUTROABS -- -- 2.8  HGB 12.4* 11.6* 12.9*  HCT 36.4* 33.2* 36.6*  MCV 94.3 94.9 94.3  PLT 180 222 228   Cardiac Enzymes:  Lab 12/29/11 0120  CKTOTAL --  CKMB --  CKMBINDEX --  TROPONINI <0.30   CBG:  Lab 12/31/11 2101 12/31/11 1654 12/31/11 1142  12/31/11 0752 12/31/11 0505  GLUCAP 97 90 97 125* 117*     Studies: No results found.  Scheduled Meds:    . enoxaparin (LOVENOX) injection  40 mg Subcutaneous Q24H  . folic acid  1 mg Oral Daily  . insulin aspart  0-9 Units Subcutaneous Q4H  . insulin glargine  15 Units Subcutaneous QHS  . levofloxacin  500 mg Oral Daily  . LORazepam  0-4 mg Intravenous Q12H  . metoprolol tartrate  50 mg Oral BID  . multivitamin with minerals  1 tablet Oral Daily  . sodium chloride  3 mL Intravenous Q12H  . thiamine  100 mg Oral Daily   Or  . thiamine  100 mg Intravenous Daily  . DISCONTD: amLODipine  10 mg Oral Daily  . DISCONTD: feeding supplement  237 mL Oral BID BM   Continuous Infusions:    . dextrose 5 % and 0.45% NaCl 75 mL/hr at 12/31/11 2003  . DISCONTD: sodium chloride 75 mL/hr at 12/31/11 1111    Principal Problem:  *Weakness generalized Active Problems:  DIABETES MELLITUS, TYPE II  Anemia of other chronic disease  HYPERTENSION  Tachycardia  Lower extremity weakness  Alcohol withdrawal  Hypertensive urgency  SIRS (systemic inflammatory response syndrome)  Hypomagnesemia    Time spent: > 35 minutes    Donnette Macmullen  [facility] Pager [phone] If 8PM-8AM, please contact night-coverage at [email or web address], password [facility] 12/31/2011, 11:45 PM  LOS: 3 days

## 2011-12-31 NOTE — Clinical Documentation Improvement (Signed)
Hypertension Documentation Clarification Query  THIS DOCUMENT IS NOT A PERMANENT PART OF THE MEDICAL RECORD  TO RESPOND TO THE THIS QUERY, FOLLOW THE INSTRUCTIONS BELOW:  1. If needed, update documentation for the patient's encounter via the notes activity.  2. Access this query again and click edit on the In [employer].  3. After updating, or not, click F2 to complete all highlighted (required) fields concerning your review. Select "additional documentation in the medical record" OR "no additional documentation provided".  4. Click Sign note button.  5. The deficiency will fall out of your In Basket *Please let us know if you are not able to complete this workflow by phone or e-mail (listed below).        12/31/11  Dear Dr. Gwenlyn Perking, Salena Saner Marton Redwood  In an effort to better capture your patient's severity of illness, reflect appropriate length of stay and utilization of resources, a review of the patient medical record has revealed the following indicators.    Based on your clinical judgment, please clarify and document in a progress note and/or discharge summary the clinical condition associated with the following supporting information:  In responding to this query please exercise your independent judgment.  The fact that a query is asked, does not imply that any particular answer is desired or expected.   Pt admitted with ETOH   According to PN on 12/29/09 pt with hypertensive urgency necessitating the treatment of amlodipine and metoprolol.    Please clarify if "hypertensive urgency " can be further specified as one of the diagnosis listed below and document in the pn and d/c summary.    Possible Clinical Conditions?   " Hypertension  " Accelerated Hypertension  " Malignant Hypertension  " Or Other Condition __________________________  " Cannot Clinically Determine   Supporting Information:  Risk Factors: Elevated BP/Alcohol dependent/hypomagnesemia/HTN/Hypertensive  urgency/SIRS   Signs and Symptoms: 1. Alcohol Withdrawal - Currently on CIWA protocol and ativan will be administered.  Patient still shaky this am and suspect that this is contributing for patient's tachycardia, tachypnea, and elevated blood pressures  Hypertensive urgency - Placed on amlodipine and metoprolol.    - Has hydralazine and Lopressor PRN elevated blood pressures. - Likely secondary to # 1   Treatment: metoprolol (LOPRESSOR) tablet 50 mg  amLODipine (NORVASC) tablet 10 mg    You may use possible, probable, or suspect with inpatient documentation. Possible, probable, suspected diagnoses MUST be documented at the time of discharge.  Reviewed: accelerated HTN can be link with current clinical presentation. I will added to PN and D/C summary.  Thank You,  Enis Slipper  RN, BSN, MSN/Inf, CCDS Clinical Documentation Specialist [facility] Dept Pager: [phone] / E-mail: [email or web address]  Health Information Management [place]

## 2011-12-31 NOTE — Evaluation (Signed)
Clinical/Bedside Swallow Evaluation Patient Details  Name: Johnny [name] MRN: [id number] Date of Birth: 01-May-1934  Today's Date: 12/31/2011 Time:  -     Past Medical History:  Past Medical History  Diagnosis Date  . Acute alcoholic hepatitis 05/20/2010  . Anemia of other chronic disease 08/16/2007  . Atrial fibrillation 08/12/2007  . CORONARY ARTERY DISEASE 12/11/2006  . DIABETES MELLITUS, TYPE II 12/11/2006  . HYPERTENSION 12/11/2006  . Hyperlipidemia   . ETOH abuse   . Chronic low back pain    Past Surgical History:  Past Surgical History  Procedure Date  . Coronary artery bypass graft    HPI:   Pt is a 77 yo male adm to [facility] with AMS, found down at home.  + ETOH use, CAD, DM, Afib. Per dietician note pt was not eating well prior to admission.  Speech is very dysarthric but pt is verbalizing.  RN and nurse tech report pt is coughing with all po intake.    Assessment / Plan / Recommendation Clinical Impression  Pt currently is not appropriate for po - he is very lethargic but would open his eyes and answer questions briefly.  Unfortuantely speech is very dysarthric and incomprehensible.  Oral care provided with pt upright- slp brushed pt's teeth and gums/tongue using toothbrush and oral suction.  Pt overtly aspirated a small amount of water from dental care - weak congested nonproductive cough.  Rec pt continue strictly npo and reeval with improved LOA.  Question if pt may have alcoholic neuroopathy that may impair baseline swallow but hopeful for improvement with improved medical status and LOA.     Aspiration Risk  Severe    Diet Recommendation NPO (no po meds recommended)        Other  Recommendations     Follow Up Recommendations       Frequency and Duration min 2x/week  2 weeks   Pertinent Vitals/Pain Afebrile, congested, crackles     SLP Swallow Goals Goal #3: Pt will maintain level of alertness for 2 minutes with min verbal cues to aid in determining readiness for po  intake.  Goal #4: Pt will swallow nectar thick liquid within 5-7 seconds without s/s of aspiration.    Swallow Study Prior Functional Status       General Date of Onset: 12/31/11 HPI:  Pt is a 77 yo male adm to [facility] with AMS, found down at home.  + ETOH use, CAD, DM, Afib. Per dietician note pt was not eating well prior to admission.  Speech is very dysarthric but pt is verbalizing.  RN and nurse tech report pt is coughing with all po intake.  Type of Study: Bedside swallow evaluation Previous Swallow Assessment: none in system Diet Prior to this Study: NPO Temperature Spikes Noted: No Respiratory Status: Room air Oral Cavity - Dentition: Poor condition Self-Feeding Abilities: Total assist Patient Positioning: Upright in bed Baseline Vocal Quality: Breathy;Low vocal intensity;Hoarse Volitional Cough: Cognitively unable to elicit Volitional Swallow: Unable to elicit    Oral/Motor/Sensory Function Overall Oral Motor/Sensory Function:  (severe dysarthria - suspect d/t W/D- weak)   Ice Chips Ice chips: Not tested   Thin Liquid Thin Liquid: Not tested    Nectar Thick Nectar Thick Liquid: Not tested   Honey Thick Honey Thick Liquid: Not tested   Puree Puree: Not tested   Solid   GO    Solid: Not tested       Donavan Burnet, MS [facility] SLP [phone]'

## 2012-01-01 LAB — GLUCOSE, CAPILLARY
Glucose-Capillary: 91 mg/dL (ref 70–99)
Glucose-Capillary: 109 mg/dL — ABNORMAL HIGH (ref 70–99)

## 2012-01-01 LAB — CBC
HCT: 33.2 % — ABNORMAL LOW (ref 39.0–52.0)
MCHC: 35.2 g/dL (ref 30.0–36.0)
RDW: 16.8 % — ABNORMAL HIGH (ref 11.5–15.5)

## 2012-01-01 LAB — BASIC METABOLIC PANEL
BUN: 12 mg/dL (ref 6–23)
GFR calc Af Amer: >90 mL/min (ref >90)
GFR calc non Af Amer: >90 mL/min (ref >90)
Potassium: 3.2 mEq/L — ABNORMAL LOW (ref 3.5–5.1)
Sodium: 131 mEq/L — ABNORMAL LOW (ref 135–145)

## 2012-01-01 MED ORDER — POTASSIUM CHLORIDE CRYS ER 20 MEQ PO TBCR
40.0000 meq | EXTENDED_RELEASE_TABLET | ORAL | Status: AC
  Administered 2012-01-01 – 2012-01-02 (×3): 40 meq via ORAL
  Filled 2012-01-01 (×4): qty 2

## 2012-01-01 MED ORDER — LABETALOL HCL 300 MG PO TABS
300.0000 mg | ORAL_TABLET | Freq: Two times a day (BID) | ORAL | Status: DC
  Administered 2012-01-01 – 2012-01-05 (×8): 300 mg via ORAL
  Filled 2012-01-01 (×10): qty 1

## 2012-01-01 MED ORDER — GLUCERNA SHAKE PO LIQD
237.0000 mL | Freq: Two times a day (BID) | ORAL | Status: DC
  Administered 2012-01-02 – 2012-01-04 (×4): 237 mL via ORAL
  Filled 2012-01-01 (×8): qty 237

## 2012-01-01 MED ORDER — SODIUM CHLORIDE 0.9 % IV SOLN
INTRAVENOUS | Status: DC
  Administered 2012-01-01 – 2012-01-04 (×4): via INTRAVENOUS

## 2012-01-01 NOTE — Progress Notes (Signed)
Speech Language Pathology Dysphagia Treatment Patient Details Name: Johnny [name] MRN: [id number] DOB: Jul 07, 1934 Today's Date: 01/01/2012 Time: 2440-1027 SLP Time Calculation (min): 25 min  Assessment / Plan / Recommendation Clinical Impression  Pt is much improved as compared to yesterday. Today, pt is alert, intelligible, and cooperative. Pt allowed oral care prior to po trials.  RN present to provide meds crushed in puree.  No overt s/s aspiration observed with puree or thin liquid consistencies.  Pt exhibited cough response to dry cracker.    Diet Recommendation  Initiate / Change Diet: Dysphagia 1 (puree);Thin liquid    SLP Plan Goals updated   Pertinent Vitals/Pain None reported   Swallowing Goals  SLP Swallowing Goals Patient will consume recommended diet without observed clinical signs of aspiration with: Moderate assistance Patient will utilize recommended strategies during swallow to increase swallowing safety with: Moderate assistance Swallow Study Goal #3 - Progress: Met Swallow Study Goal #4 - Progress: Met  General Temperature Spikes Noted: No Respiratory Status: Room air Behavior/Cognition: Alert;Cooperative;Pleasant mood Oral Cavity - Dentition: Missing dentition;Poor condition Patient Positioning: Upright in bed  Oral Cavity - Oral Hygiene Does patient have any of the following "at risk" factors?: Nutritional status - inadequate Brush patient's teeth BID with toothbrush (using toothpaste with fluoride): Yes   Dysphagia Treatment Treatment focused on: Skilled observation of diet tolerance;Upgraded PO texture trials;Patient/family/caregiver education;Utilization of compensatory strategies Treatment Methods/Modalities: Skilled observation Patient observed directly with PO's: Yes Type of PO's observed: Dysphagia 3 (soft);Thin liquids;Dysphagia 1 (puree) Feeding: Total assist (due to tremors) Liquids provided via: Straw Pharyngeal Phase Signs & Symptoms: Immediate  cough;Multiple swallows Type of cueing: Verbal Amount of cueing: Minimal  Samanda Buske B. [place], [facility], CCC-SLP [phone]    Leigh Aurora 01/01/2012, 10:04 AM

## 2012-01-01 NOTE — Care Management Note (Signed)
    Page 1 of 1   01/06/2012     11:12:26 AM   CARE MANAGEMENT NOTE 01/06/2012  Patient:  Johnny [name], Johnny [name]   Account Number:  [id number]  Date Initiated:  01/01/2012  Documentation initiated by:  Lanier Clam  Subjective/Objective Assessment:   ADMITTED W/GEN'L WEAKNESS.ETOH W/DRAWAL.     Action/Plan:   FROM HOME   Anticipated DC Date:  01/05/2012   Anticipated DC Plan:  HOME/SELF CARE  In-house referral  Clinical Social Worker      DC Planning Services  CM consult      Choice offered to / List presented to:             Status of service:  Completed, signed off Medicare Important Message given?   (If response is "NO", the following Medicare IM given date fields will be blank) Date Medicare IM given:   Date Additional Medicare IM given:    Discharge Disposition:  HOME/SELF CARE  Per UR Regulation:  Reviewed for med. necessity/level of care/duration of stay  If discussed at Long Length of Stay Meetings, dates discussed:    Comments:  01/06/12 Lucile Didonato RN,BSN NCM [phone] NOTED PATIENT DECLINED SNF.D/C HOME,W/NO D/C NEEDS.  01/01/12 Keasia Dubose RN,BSN NCM [phone] PT-SNF

## 2012-01-01 NOTE — Progress Notes (Addendum)
[facility] PROGRESS NOTE  GIANCARLO ASKREN ZOX:[id number] DOB: [birth year] DOA: 12/28/2011 PCP: Rogelia Boga, MD  Assessment/Plan: Principal Problem:  *Weakness generalized Active Problems:  DIABETES MELLITUS, TYPE II  Anemia of other chronic disease  HYPERTENSION  Tachycardia  Lower extremity weakness  Alcohol withdrawal  Hypertensive urgency SIRs  1. Alcohol Withdrawal - Currently on CIWA protocol and ativan will be administered.  Patient still shaky but improving. - Continue to monitor closely  2. Accelerated Hypertension - Will start labetalol 300mg  BID for better control now that able to take PO again. -continue IV hydralazine for PRN. -Withdrawal from ETOH also contributing. -Will follow VS.  3. SIRs - Pt is non toxic appearing and most likely due to withdrawal. -Levaquin started on 9/10 for 6 days treatment  4. Weakness  - Multifactorial likely related to deconditioning, Alcohol dependence, and hypomagnesemia/hyponatremia/hypokalemia. - Physical therapy recommending SNF at discharge; family members and patient in agreement.  5. Hypomagnesemia and hypokalemia -follow Mg level -Potassium will be repleted and BMET will be check in am.  6. DM - At this point will plan on continuing lantus.  7. Weakness - Seemed generalized.  - X ray of lumbar spine yesterday due to c/o lower back discomfort showed no significant change or acute findings. - Patient is better and slightly stronger. Per PT rec's will required SNF at discharge. -continue supportive care  Code Status: Full Family Communication: Spoke with patient  Disposition Plan: Pending clinical improvement   Brief narrative: 77 y/o admitted for generalized weakness.  Presenting with alcohol withdrawal.  Currently on CIWA protocol.  Currently awaiting Physical therapy evaluation.  Has SIRs criteria but no active infection and presumed to be secondary to alcohol withdrawal but further work up  pending.  Consultants:  None  Procedures:  X ray of lumbar spine   HPI/Subjective: Patient AAOX3; no CP or SOB. Dit advance to dys 1 Afebrile.   Objective: Filed Vitals:   01/01/12 0441 01/01/12 1014 01/01/12 1132 01/01/12 1500  BP: 140/67 202/72 150/63 162/67  Pulse: 87 94 73 98  Temp: 98.2 F (36.8 C) 98.4 F (36.9 C)  98.3 F (36.8 C)  TempSrc: Oral Oral  Oral  Resp: 20 21  20   Height:      Weight:      SpO2: 97% 97%  96%    Intake/Output Summary (Last 24 hours) at 01/01/12 1808 Last data filed at 01/01/12 1600  Gross per 24 hour  Intake 1496.25 ml  Output    650 ml  Net 846.25 ml   Filed Weights   12/28/11 2231 12/29/11 0428  Weight: 90.719 kg (200 lb) 78.6 kg (173 lb 4.5 oz)    Exam:   General:  Pt alert, awake and oriented X 3; cooperative to examination.  Cardiovascular: RRR, No MRG  Respiratory: CTA BL, no wheezes  Abdomen: Soft, NT, ND  Neuro: shaky, but w/o focal deficit appreciated.  Data Reviewed: Basic Metabolic Panel:  Lab 01/01/12 8119 12/30/11 0425 12/29/11 0120 12/28/11 2257 12/28/11 2251  NA 131* 134* 142 -- 138  K 3.2* 3.6 3.5 -- 3.5  CL 95* 95* 105 -- 99  CO2 27 25 19  -- 23  GLUCOSE 97 106* 205* -- 232*  BUN 12 8 6  -- 6  CREATININE 0.70 0.69 0.83 -- 0.89  CALCIUM 8.5 8.8 7.9* -- 8.5  MG -- 1.3* -- 0.9* --  PHOS -- -- -- 2.8 --   Liver Function Tests:  Lab 12/28/11 2251  AST 38*  ALT 24  ALKPHOS 176*  BILITOT 0.3  PROT 7.9  ALBUMIN 3.1*   CBC:  Lab 01/01/12 0433 12/30/11 0425 12/29/11 0120 12/28/11 2251  WBC 9.2 16.1* 6.0 4.8  NEUTROABS -- -- -- 2.8  HGB 11.7* 12.4* 11.6* 12.9*  HCT 33.2* 36.4* 33.2* 36.6*  MCV 93.5 94.3 94.9 94.3  PLT 161 180 222 228   Cardiac Enzymes:  Lab 12/29/11 0120  CKTOTAL --  CKMB --  CKMBINDEX --  TROPONINI <0.30   CBG:  Lab 01/01/12 1201 01/01/12 0759 01/01/12 0336 01/01/12 0008 12/31/11 2101  GLUCAP 100* 91 94 90 97     Studies: No results found.  Scheduled  Meds:    . enoxaparin (LOVENOX) injection  40 mg Subcutaneous Q24H  . folic acid  1 mg Oral Daily  . insulin aspart  0-9 Units Subcutaneous Q4H  . insulin glargine  15 Units Subcutaneous QHS  . labetalol  300 mg Oral BID  . levofloxacin  500 mg Oral Daily  . LORazepam  0-4 mg Intravenous Q12H  . multivitamin with minerals  1 tablet Oral Daily  . sodium chloride  3 mL Intravenous Q12H  . thiamine  100 mg Oral Daily   Or  . thiamine  100 mg Intravenous Daily  . DISCONTD: amLODipine  10 mg Oral Daily  . DISCONTD: feeding supplement  237 mL Oral BID BM  . DISCONTD: metoprolol tartrate  50 mg Oral BID   Continuous Infusions:    . dextrose 5 % and 0.45% NaCl 75 mL/hr at 12/31/11 2003  . DISCONTD: sodium chloride 75 mL/hr at 12/31/11 1111    Time spent: > 30 minutes    Deontrey Massi  [facility] Pager [phone] If 8PM-8AM, please contact night-coverage at [email or web address], password [facility] 01/01/2012, 6:08 PM  LOS: 4 days

## 2012-01-02 ENCOUNTER — Inpatient Hospital Stay (HOSPITAL_COMMUNITY): Payer: Medicare PPO

## 2012-01-02 LAB — GLUCOSE, CAPILLARY
Glucose-Capillary: 109 mg/dL — ABNORMAL HIGH (ref 70–99)
Glucose-Capillary: 113 mg/dL — ABNORMAL HIGH (ref 70–99)
Glucose-Capillary: 135 mg/dL — ABNORMAL HIGH (ref 70–99)

## 2012-01-02 LAB — BASIC METABOLIC PANEL
BUN: 16 mg/dL (ref 6–23)
CO2: 27 mEq/L (ref 19–32)
Calcium: 8.1 mg/dL — ABNORMAL LOW (ref 8.4–10.5)
Chloride: 97 mEq/L (ref 96–112)
Creatinine, Ser: 0.94 mg/dL (ref 0.50–1.35)
GFR calc Af Amer: >90 mL/min (ref >90)
GFR calc non Af Amer: 80 mL/min — ABNORMAL LOW (ref >90)
Glucose, Bld: 95 mg/dL (ref 70–99)
Potassium: 3.3 mEq/L — ABNORMAL LOW (ref 3.5–5.1)
Sodium: 131 mEq/L — ABNORMAL LOW (ref 135–145)

## 2012-01-02 MED ORDER — POTASSIUM CHLORIDE CRYS ER 20 MEQ PO TBCR
40.0000 meq | EXTENDED_RELEASE_TABLET | Freq: Every day | ORAL | Status: DC
  Administered 2012-01-02 – 2012-01-05 (×4): 40 meq via ORAL
  Filled 2012-01-02 (×4): qty 2

## 2012-01-02 MED ORDER — ISOSORBIDE MONONITRATE ER 30 MG PO TB24
30.0000 mg | ORAL_TABLET | Freq: Every day | ORAL | Status: DC
  Administered 2012-01-03 – 2012-01-05 (×3): 30 mg via ORAL
  Filled 2012-01-02 (×3): qty 1

## 2012-01-02 MED ORDER — RESOURCE THICKENUP CLEAR PO POWD
ORAL | Status: DC | PRN
  Filled 2012-01-02: qty 125

## 2012-01-02 MED ORDER — MAGNESIUM OXIDE 400 (241.3 MG) MG PO TABS
400.0000 mg | ORAL_TABLET | Freq: Every day | ORAL | Status: DC
  Administered 2012-01-03 – 2012-01-05 (×3): 400 mg via ORAL
  Filled 2012-01-02 (×3): qty 1

## 2012-01-02 NOTE — Progress Notes (Signed)
Speech Language Pathology Dysphagia Treatment Patient Details Name: Johnny [name] MRN: [id number] DOB: 01/18/1935 Today's Date: 01/02/2012 Time: 2841-3244 SLP Time Calculation (min): 39 min  Assessment / Plan / Recommendation Clinical Impression  RN reported patient was coughing during lunch.  Patient did have consistent audible throat clearing after each sip of liquid, and a delayed cough was noted.  An objective study is recommended to determine appropriate diet recommendations, determine what compensatory strategies may prevent aspiration.     Diet Recommendation  Initiate / Change Diet: Dysphagia 1 (puree);Nectar-thick liquid    SLP Plan MBS;New goals to be determined pending objective testing;Other (Comment) (Discussed with RN and MD)   Pertinent Vitals/Pain n/a       General Respiratory Status: Room air Behavior/Cognition: Alert;Cooperative;Requires cueing Oral Cavity - Dentition: Missing dentition;Poor condition Patient Positioning: Upright in chair  Oral Cavity - Oral Hygiene Does patient have any of the following "at risk" factors?: Nutritional status - inadequate Brush patient's teeth BID with toothbrush (using toothpaste with fluoride): Yes   Dysphagia Treatment Treatment focused on: Skilled observation of diet tolerance;Upgraded PO texture trials;Patient/family/caregiver education;Utilization of compensatory strategies Treatment Methods/Modalities: Skilled observation Patient observed directly with PO's: Yes Type of PO's observed: Dysphagia 3 (soft);Thin liquids;Dysphagia 1 (puree) Feeding: Needs assist;Needs set up Liquids provided via: Cup Oral Phase Signs & Symptoms: Prolonged oral phase Pharyngeal Phase Signs & Symptoms: Suspected delayed swallow initiation;Multiple swallows;Immediate throat clear;Delayed cough Type of cueing: Verbal;Tactile Amount of cueing: Minimal   GO     Maryjo [name] T 01/02/2012, 2:44 PM

## 2012-01-02 NOTE — Progress Notes (Addendum)
[facility] PROGRESS NOTE  Johnny [name] MVH:[id number] DOB: [birth year] DOA: 12/28/2011 PCP: Rogelia Boga, MD  Assessment/Plan: Principal Problem:  *Weakness generalized Active Problems:  DIABETES MELLITUS, TYPE II  Anemia of other chronic disease  HYPERTENSION  Tachycardia  Lower extremity weakness  Alcohol withdrawal  Hypertensive urgency SIRs  1. Alcohol Withdrawal - Currently on CIWA protocol and ativan will be administered.  Patient still shaky but improving. - Continue to monitor closely  2. Accelerated Hypertension - slightly better -Will continue labetalol 300mg  BID and will add imdur for better control  -continue IV hydralazine for PRN. -Withdrawal from ETOH also contributing. -Will follow VS.  3. SIRs - Pt is non toxic appearing and most likely due to withdrawal. -Levaquin started on 9/10; will treat bronchitis and potential UTI with 6 days course. -patient non febrile  4. Weakness  - Multifactorial likely related to deconditioning, Alcohol dependence, and hypomagnesemia/hyponatremia/hypokalemia. - Physical therapy recommending SNF at discharge; family members and patient in agreement.  5. Hypomagnesemia and hypokalemia -Mg 1.3; will start daily maintenance dose -Potassium also low; will start maintenance potassium as well.  6. DM - At this point will continue current regimen (lantus at same dose). A1C 6.5  7. Weakness - Seemed generalized.  - X ray of lumbar spine on 12/29/11 due to c/o lower back discomfort showed no significant change or acute abnormalities. - Patient is better and slightly stronger. Per PT rec's will required SNF at discharge. -continue supportive care  8. Dysphagia -initially thought to be associated with obtunded state from ETOH and ciwa protocol medications. -despite being more alert still having some cough with food intake. -MBS per speech therapy rec's. Will follow results and further rec's  Code Status:  Full Family Communication: Spoke with patient  Disposition Plan: Pending clinical improvement   Brief narrative: 77 y/o admitted for generalized weakness.  Presenting with alcohol withdrawal.  Currently on CIWA protocol.  Currently awaiting Physical therapy evaluation.  Has SIRs criteria but no active infection and presumed to be secondary to alcohol withdrawal but further work up pending.  Consultants:  None  Procedures:  X ray of lumbar spine   HPI/Subjective: Patient AAOX3; no CP or SOB. Due to some cough with food intake, MBS has been ordered and patient started on nectar thick liquids. No fever.   Objective: Filed Vitals:   01/02/12 0521 01/02/12 1000 01/02/12 1345 01/02/12 2135  BP: 147/61 154/71 187/61 171/81  Pulse: 70 96 91   Temp: 99 F (37.2 C) 99.5 F (37.5 C) 97.6 F (36.4 C)   TempSrc: Oral Oral Oral   Resp: 16 20 20    Height:      Weight:      SpO2: 99% 98% 98%     Intake/Output Summary (Last 24 hours) at 01/02/12 2156 Last data filed at 01/02/12 1600  Gross per 24 hour  Intake 1688.33 ml  Output   1526 ml  Net 162.33 ml   Filed Weights   12/28/11 2231 12/29/11 0428  Weight: 90.719 kg (200 lb) 78.6 kg (173 lb 4.5 oz)    Exam:   General:  Pt alert, awake and oriented X 3; cooperative to examination. Some cough with food ingestion  Cardiovascular: RRR, No MRG  Respiratory: CTA BL, no wheezes  Abdomen: Soft, NT, ND  Neuro: shaky, but w/o focal deficit appreciated.  Data Reviewed: Basic Metabolic Panel:  Lab 01/02/12 8413 01/01/12 0433 12/30/11 0425 12/29/11 0120 12/28/11 2257 12/28/11 2251  NA 131* 131* 134* 142 --  138  K 3.3* 3.2* 3.6 3.5 -- 3.5  CL 97 95* 95* 105 -- 99  CO2 27 27 25 19  -- 23  GLUCOSE 95 97 106* 205* -- 232*  BUN 16 12 8 6  -- 6  CREATININE 0.94 0.70 0.69 0.83 -- 0.89  CALCIUM 8.1* 8.5 8.8 7.9* -- 8.5  MG 1.3* -- 1.3* -- 0.9* --  PHOS -- -- -- -- 2.8 --   Liver Function Tests:  Lab 12/28/11 2251  AST 38*   ALT 24  ALKPHOS 176*  BILITOT 0.3  PROT 7.9  ALBUMIN 3.1*   CBC:  Lab 01/01/12 0433 12/30/11 0425 12/29/11 0120 12/28/11 2251  WBC 9.2 16.1* 6.0 4.8  NEUTROABS -- -- -- 2.8  HGB 11.7* 12.4* 11.6* 12.9*  HCT 33.2* 36.4* 33.2* 36.6*  MCV 93.5 94.3 94.9 94.3  PLT 161 180 222 228   Cardiac Enzymes:  Lab 12/29/11 0120  CKTOTAL --  CKMB --  CKMBINDEX --  TROPONINI <0.30   CBG:  Lab 01/02/12 1645 01/02/12 1153 01/02/12 0807 01/02/12 0403 01/02/12 0009  GLUCAP 135* 113* 84 100* 109*     Studies: No results found.  Scheduled Meds:    . enoxaparin (LOVENOX) injection  40 mg Subcutaneous Q24H  . feeding supplement  237 mL Oral BID BM  . folic acid  1 mg Oral Daily  . insulin aspart  0-9 Units Subcutaneous Q4H  . insulin glargine  15 Units Subcutaneous QHS  . labetalol  300 mg Oral BID  . levofloxacin  500 mg Oral Daily  . LORazepam  0-4 mg Intravenous Q12H  . multivitamin with minerals  1 tablet Oral Daily  . potassium chloride  40 mEq Oral Q4H  . potassium chloride  40 mEq Oral Daily  . sodium chloride  3 mL Intravenous Q12H  . thiamine  100 mg Oral Daily   Or  . thiamine  100 mg Intravenous Daily   Continuous Infusions:    . sodium chloride 50 mL/hr at 01/02/12 1445    Time spent: > 25 minutes    Marit Goodwill  [facility] Pager [phone] If 8PM-8AM, please contact night-coverage at [email or web address], password [facility] 01/02/2012, 9:56 PM  LOS: 5 days

## 2012-01-02 NOTE — Progress Notes (Addendum)
CSW met with patient. CSW presented bed offers. Patient requesting [facility]. Physical therapy notes sent to [facility]. Possible d/c Monday. Berkley Harvey for [facility] started.  Masin Shatto C. Isador Castille MSW, [place] [phone] Francine Graven denied out of network coverage for [facility]. CSW referred to back up choice of [facility].  Javohn Basey C. Hilton Saephan MSW, LCSW [phone]

## 2012-01-02 NOTE — Progress Notes (Signed)
Physical Therapy Treatment Patient Details Name: Johnny [name] MRN: [id number] DOB: 1934/09/29 Today's Date: 01/02/2012 Time: 1191-4782 PT Time Calculation (min): 19 min  PT Assessment / Plan / Recommendation Comments on Treatment Session  77 y.o. male admitted to [facility] for ETOH withdrawal.  He is progressing with mobility today and was actually able to take steps with the RW.  He is increadibly deconditioned and very shaky on his feet.      Follow Up Recommendations  Skilled nursing facility    Barriers to Discharge        Equipment Recommendations  None recommended by PT    Recommendations for Other Services    Frequency Min 3X/week   Plan Discharge plan remains appropriate;Frequency remains appropriate    Precautions / Restrictions Precautions Precautions: Fall Restrictions Weight Bearing Restrictions: No   Pertinent Vitals/Pain No reports of pain.      Mobility  Bed Mobility Bed Mobility: Supine to Sit;Sitting - Scoot to Edge of Bed Supine to Sit: 3: Mod assist;HOB elevated;With rails Sitting - Scoot to Edge of Bed: 3: Mod assist;With rail Details for Bed Mobility Assistance: mod assist to support trunk during transition, mod assist to weight shift hips to EOB with bed pad Transfers Transfers: Sit to Stand;Stand to Sit Sit to Stand: 1: +2 Total assist;From elevated surface;With upper extremity assist;From bed Sit to Stand: Patient Percentage: 70% Stand to Sit: 1: +2 Total assist;With upper extremity assist;To elevated surface;To chair/3-in-1 Stand to Sit: Patient Percentage: 60% Details for Transfer Assistance: 2 person assist to get up to feet using RW from elevated bed. Pt flexed at at least 45 degree angle in standing (reports he was hunched over PTA).  As he fatigued with gait it required more assist to lower slowly to recliner chair.  Verbal cues for safe hand placment.   Ambulation/Gait Ambulation/Gait Assistance: 1: +2 Total assist Ambulation/Gait: Patient  Percentage: 70% Ambulation Distance (Feet): 8 Feet Assistive device: Rolling walker Ambulation/Gait Assistance Details: Chair to follow for safety.  Two people needed for safety during gait.  Pt flexed at trunk and very cogwheel-type gait pattern.  Jerky movements in the legs with WB.   Gait Pattern: Step-to pattern;Shuffle;Trunk flexed Gait velocity: much less than 1.8 ft/sec which puts him at high risk for recurrent falls.        PT Goals Acute Rehab PT Goals PT Goal: Supine/Side to Sit - Progress: Progressing toward goal PT Goal: Sit to Stand - Progress: Progressing toward goal PT Goal: Ambulate - Progress: Progressing toward goal  Visit Information  Last PT Received On: 01/02/12 Assistance Needed: +2    Subjective Data  Subjective: Pt reports that they would not let him out of bed last night (due to high fall risk and confusion).  All bed rails are up and bed alarm is on.     Cognition  Overall Cognitive Status: Impaired    Balance     End of Session PT - End of Session Equipment Utilized During Treatment: Gait belt Activity Tolerance: Patient limited by fatigue Patient left: in chair;with call bell/phone within reach;with chair alarm set     Aashna Matson B. Chaneka Trefz, PT, DPT [phone]   01/02/2012, 11:02 AM

## 2012-01-02 NOTE — Clinical Social Work Psychosocial (Unsigned)
     Clinical Social Work Department BRIEF PSYCHOSOCIAL ASSESSMENT 01/02/2012  Patient:  FRANCIS, DOENGES     Account Number:  [id number]     Admit date:  12/28/2011  Clinical Social Worker:  Hattie Perch  Date/Time:  01/02/2012 12:00 M  Referred by:  Physician  Date Referred:  01/02/2012 Referred for  SNF Placement   Other Referral:   Interview type:  Patient Other interview type:    PSYCHOSOCIAL DATA Living Status:  FAMILY Admitted from facility:   Level of care:   Primary support name:  Suprena Moreina Primary support relationship to patient:  FAMILY Degree of support available:   fair    CURRENT CONCERNS Current Concerns  Post-Acute Placement   Other Concerns:    SOCIAL WORK ASSESSMENT / PLAN CSW met with patient. patient is currently in alcohol withdrawal. patient in need of snf placement. patient is alert and oriented X3 but having difficulty discussing discharge plan. patient agreeable to being faxed out and recieving bed offers.   Assessment/plan status:   Other assessment/ plan:   Information/referral to community resources:    PATIENTS/FAMILYS RESPONSE TO PLAN OF CARE: agreeable to recieving bed offers.

## 2012-01-02 NOTE — Progress Notes (Signed)
Clinical Social Work Department CLINICAL SOCIAL WORK PLACEMENT NOTE 01/02/2012  Patient:  Johnny [name], Johnny [name]  Account Number:  [id number] Admit date:  12/28/2011  Clinical Social Worker: Lia Foyer LCSWA,  Date/time:  01/02/2012 09:33 AM  Clinical Social Work is seeking post-discharge placement for this patient at the following level of care:   SKILLED NURSING   (*CSW will update this form in Epic as items are completed)   01/02/2012  Patient/family provided with [facility] Department of Clinical Social Work's list of facilities offering this level of care within the geographic area requested by the patient (or if unable, by the patient's family).  01/02/2012  Patient/family informed of their freedom to choose among providers that offer the needed level of care, that participate in Medicare, Medicaid or managed care program needed by the patient, have an available bed and are willing to accept the patient.  01/02/2012  Patient/family informed of [facility]' ownership interest in [facility], as well as of the fact that they are under no obligation to receive care at this facility.  PASARR submitted to [employer] on 04/16/2010 PASARR number received from [employer] on 04/16/2010  FL2 transmitted to all facilities in geographic area requested by pt/family on  01/02/2012 FL2 transmitted to all facilities within larger geographic area on   Patient informed that his/her managed care company has contracts with or will negotiate with  certain facilities, including the following:     Patient/family informed of bed offers received:   Patient chooses bed at  Physician recommends and patient chooses bed at    Patient to be transferred to  on   Patient to be transferred to facility by   The following physician request were entered in Epic:   Additional Comments: [facility] Search  Lia Foyer, LCSWA [facility] Clinical Social Worker Contact #: [phone] (PRN)

## 2012-01-03 LAB — GLUCOSE, CAPILLARY
Glucose-Capillary: 137 mg/dL — ABNORMAL HIGH (ref 70–99)
Glucose-Capillary: 53 mg/dL — ABNORMAL LOW (ref 70–99)
Glucose-Capillary: 77 mg/dL (ref 70–99)
Glucose-Capillary: 82 mg/dL (ref 70–99)
Glucose-Capillary: 91 mg/dL (ref 70–99)
Glucose-Capillary: 94 mg/dL (ref 70–99)

## 2012-01-03 MED ORDER — CLONAZEPAM 0.5 MG PO TABS
0.5000 mg | ORAL_TABLET | Freq: Two times a day (BID) | ORAL | Status: DC
  Administered 2012-01-03 – 2012-01-05 (×5): 0.5 mg via ORAL
  Filled 2012-01-03 (×5): qty 1

## 2012-01-03 MED ORDER — INSULIN GLARGINE 100 UNIT/ML ~~LOC~~ SOLN
8.0000 [IU] | Freq: Every day | SUBCUTANEOUS | Status: DC
  Administered 2012-01-03: 8 [IU] via SUBCUTANEOUS

## 2012-01-03 MED ORDER — INFLUENZA VIRUS VACC SPLIT PF IM SUSP
0.5000 mL | INTRAMUSCULAR | Status: AC
  Administered 2012-01-04: 0.5 mL via INTRAMUSCULAR
  Filled 2012-01-03: qty 0.5

## 2012-01-03 NOTE — Procedures (Signed)
Objective Swallowing Evaluation: Modified Barium Swallowing Study  Patient Details  Name: Johnny [name] MRN: [id number] Date of Birth: Mar 30, 1935  Today's Date: 01/03/2012 Time: 1001-1058 SLP Time Calculation (min): 57 min  Past Medical History:  Past Medical History  Diagnosis Date  . Acute alcoholic hepatitis 05/20/2010  . Anemia of other chronic disease 08/16/2007  . Atrial fibrillation 08/12/2007  . CORONARY ARTERY DISEASE 12/11/2006  . DIABETES MELLITUS, TYPE II 12/11/2006  . HYPERTENSION 12/11/2006  . Hyperlipidemia   . ETOH abuse   . Chronic low back pain    Past Surgical History:  Past Surgical History  Procedure Date  . Coronary artery bypass graft    HPI:   Pt is a 77 yo male adm to [facility] with AMS, found down at home.  + ETOH use, CAD, DM, Afib. Per dietician note pt was not eating well prior to admission.  Pt underwent BSE of swallowing on Wednesday but was not appropriate for po intake, repeat eval completed on Thursday with recommendation for puee/thin.  Pt had not been tolerating thin liquids and MBS was ordered.  Today pt with temp to 99 and intake documented as 50%.       Assessment / Plan / Recommendation Clinical Impression  Dysphagia Diagnosis: Moderate oral phase dysphagia;Moderate pharyngeal phase dysphagia Clinical impression: Pt presents with moderate oropharyngeal dysphagia mostly characterized by weakness, trace SILENT aspiration and penetration observed during testing that did not clear with cued cough.  Chin tuck posture was not performed adequately, head down possibly 10%.  Pharyngeal stasis present without pt awareness, requiring multiple dry swallows and liquid swallows to decrease.  Appearance of secretions at pyriform sinus noted that mixed with barium and did not ever clear during evaluation.  Although pt only tracely aspirated during testing, poor sensation and weak cough as well as bedbound status and cognitive deficit make him high aspiration/aspiration pna  risk.    Suspect when pt is coughing during meals, this is due to larger amount of aspiration with intact sensation but no ability to clear.    Options include to continue po diet with accepted aspiration risk and modifications to decrease amount pt may aspirate or make npo (except small amount of water after oral care) and repeat evaluation with improved medical status.  If proceed with po diet, would recommend thin liquid as pt has less pharyngeal stasis and less aspiration with this consistency.  Suspect pt has component of chronic baseline dysphagia with possibly neuropathy due to ETOH use with current exacerbation from this event.        Treatment Recommendation  Therapy as outlined in treatment plan below    Diet Recommendation NPO (vs puree/thin with accepted risks)   Liquid Administration via: Straw (use straw) Medication Administration: Crushed with puree (follow w/water) Supervision: Staff feed patient Compensations: Slow rate;Small sips/bites;Multiple dry swallows after each bite/sip;Follow solids with liquid (allow break if coughing with po intake) Postural Changes and/or Swallow Maneuvers: Seated upright 90 degrees;Upright 30-60 min after meal    Other  Recommendations Oral Care Recommendations: Oral care before and after PO   Follow Up Recommendations       Frequency and Duration min 2x/week  2 weeks   Pertinent Vitals/Pain Slight temperature 99.0, intake was 50% yesterday, 99% oxygen saturation on room air   SLP Swallow Goals Goal #3: Pt and family will determine if pt is to accept po diet with accepted aspiration risks and use of compensatory strategies to maximize airway protection with max assist.  General Date of Onset: 12/31/11 HPI:  Pt is a 77 yo male adm to [facility] with AMS, found down at home.  + ETOH use, CAD, DM, Afib. Per dietician note pt was not eating well prior to admission.  Pt underwent BSE of swallowing on Wednesday but was not appropriate for po intake,  repeat eval completed on Thursday with recommendation for puee/thin.  Pt had not been tolerating thin liquids and MBS was ordered.  Today pt with temp to 99 and intake documented as 50%.   Type of Study: Modified Barium Swallowing Study Reason for Referral: Objectively evaluate swallowing function Diet Prior to this Study: Nectar-thick liquids;Dysphagia 1 (puree) Respiratory Status: Room air Behavior/Cognition: Alert;Cooperative;Requires cueing Oral Cavity - Dentition: Poor condition Oral Motor / Sensory Function: Impaired - see Bedside swallow eval Self-Feeding Abilities: Total assist Baseline Vocal Quality: Hoarse Volitional Swallow: Unable to elicit Anatomy: Within functional limits Pharyngeal Secretions: Not observed secondary MBS    Reason for Referral Objectively evaluate swallowing function   Oral Phase Oral Preparation/Oral Phase Oral Phase: Impaired Oral - Nectar Oral - Nectar Teaspoon: Reduced posterior propulsion;Weak lingual manipulation;Delayed oral transit Oral - Nectar Cup: Reduced posterior propulsion;Weak lingual manipulation;Delayed oral transit Oral - Nectar Straw: Reduced posterior propulsion;Weak lingual manipulation;Delayed oral transit Oral - Thin Oral - Thin Cup: Reduced posterior propulsion;Weak lingual manipulation;Delayed oral transit Oral - Thin Straw: Reduced posterior propulsion;Weak lingual manipulation;Delayed oral transit Oral - Solids Oral - Puree: Reduced posterior propulsion;Weak lingual manipulation;Delayed oral transit Oral - Regular: Reduced posterior propulsion;Impaired mastication;Weak lingual manipulation;Delayed oral transit   Pharyngeal Phase Pharyngeal Phase Pharyngeal Phase: Impaired Pharyngeal - Nectar Pharyngeal - Nectar Teaspoon: Premature spillage to valleculae;Reduced pharyngeal peristalsis;Reduced epiglottic inversion;Pharyngeal residue - valleculae;Pharyngeal residue - pyriform sinuses;Inter-arytenoid space residue;Reduced tongue  base retraction Pharyngeal - Nectar Cup: Premature spillage to valleculae;Reduced pharyngeal peristalsis;Reduced epiglottic inversion;Pharyngeal residue - valleculae;Pharyngeal residue - pyriform sinuses;Inter-arytenoid space residue;Reduced tongue base retraction;Trace aspiration;Penetration/Aspiration during swallow Penetration/Aspiration details (nectar cup): Material enters airway, passes BELOW cords without attempt by patient to eject out (silent aspiration) Pharyngeal - Thin Pharyngeal - Thin Teaspoon: Premature spillage to valleculae;Reduced pharyngeal peristalsis;Reduced epiglottic inversion;Pharyngeal residue - valleculae;Pharyngeal residue - pyriform sinuses;Inter-arytenoid space residue;Reduced tongue base retraction Pharyngeal - Thin Cup: Premature spillage to valleculae;Reduced pharyngeal peristalsis;Reduced epiglottic inversion;Pharyngeal residue - valleculae;Pharyngeal residue - pyriform sinuses;Inter-arytenoid space residue;Reduced tongue base retraction Pharyngeal - Thin Straw: Premature spillage to valleculae;Reduced pharyngeal peristalsis;Pharyngeal residue - valleculae;Pharyngeal residue - pyriform sinuses;Inter-arytenoid space residue;Reduced tongue base retraction (chin tuck posture attempted, not successful) Pharyngeal - Solids Pharyngeal - Puree: Delayed swallow initiation;Premature spillage to valleculae;Reduced pharyngeal peristalsis;Reduced epiglottic inversion;Pharyngeal residue - pyriform sinuses;Pharyngeal residue - valleculae;Pharyngeal residue - posterior pharnyx;Trace aspiration Pharyngeal - Regular: Delayed swallow initiation;Premature spillage to valleculae;Reduced pharyngeal peristalsis;Reduced epiglottic inversion;Pharyngeal residue - pyriform sinuses;Pharyngeal residue - valleculae;Reduced tongue base retraction;Penetration/Aspiration after swallow Penetration/Aspiration details (regular): Material enters airway, remains ABOVE vocal cords and not ejected out  (penetration of liquids retained in pharynx)  Cervical Esophageal Phase    GO    Cervical Esophageal Phase Cervical Esophageal Phase: WFL (difficult to view secondary to pt's shoulder blocking view)         Donavan Burnet, MS [facility] SLP [phone]

## 2012-01-03 NOTE — Progress Notes (Signed)
Very restless, agitated this a.m.,. Klonopin added. Seemed very helpful as pt more calm, not trying to to climb out of bed frequently, and decreased tremors.  Seen by speech with recommendations given.

## 2012-01-03 NOTE — Progress Notes (Addendum)
[facility] PROGRESS NOTE  Johnny [name] WUJ:[id number] DOB: [birth year] DOA: 12/28/2011 PCP: Rogelia Boga, MD  Assessment/Plan: Principal Problem:  *Weakness generalized Active Problems:  DIABETES MELLITUS, TYPE II  Anemia of other chronic disease  HYPERTENSION  Tachycardia  Lower extremity weakness  Alcohol withdrawal  Hypertensive urgency SIRs  1. Alcohol Withdrawal - Patient has finished CIWA protocol for detox. At this point slightly shaky but no further signs of withdrawal. -Will start low-dose Klonopin to help with underlying anxiety.  2. Accelerated Hypertension - Well control. Will continue current regimen  -Will continue labetalol 300mg  BID and imdur. -continue IV hydralazine for PRN. -Will follow VS.  3. SIRs - Pt is non toxic appearing and most likely due to withdrawal. -Levaquin for a total of 6 days started on 12/30/11 -patient non febrile  4. Weakness  - Multifactorial likely related to deconditioning, Alcohol dependence, and hypomagnesemia/hyponatremia/hypokalemia. - Physical therapy recommending SNF at discharge; family members and patient in agreement.  5. Hypomagnesemia and hypokalemia -will continue potassium and magnesium daily maintenance dose  6. DM - Overall is stable but with episodes of hypoglycemia. At this point will change his lantus to 8 units. A1C 6.5  7. Weakness - Seemed generalized.  - X ray of lumbar spine on 12/29/11 due to c/o lower back discomfort showed no significant change or acute abnormalities. - Patient is better and slightly stronger. Per PT rec's will required SNF at discharge. -continue supportive care  8. Dysphagia -initially thought to be associated with obtunded state from ETOH and ciwa protocol medications. -despite being more alert still having some cough with food intake. -MBS has demonstrated decrease oropharyngeal sensation and silent aspiration. At this point patient will continue on dysphagia 1 with  thin liquids and a specific techniques that has been provided by speech therapy to decrease aspiration. Patient decreased sensation and weak motor oropharyngeal deficit most likely secondary to alcohol.   9. Agitation and anxiety: -Will start low-dose Klonopin twice a day to help with his symptoms.  Code Status: Full Family Communication: Spoke with patient  Disposition Plan: Pending clinical improvement   Brief narrative: 77 y/o admitted for generalized weakness.  Presenting with alcohol withdrawal.  Currently on CIWA protocol.  Currently awaiting Physical therapy evaluation.  Has SIRs criteria but no active infection and presumed to be secondary to alcohol withdrawal but further work up pending.  Consultants:  None  Procedures:  X ray of lumbar spine   HPI/Subjective: Patient AAOX3; no CP or SOB. MBS has demonstrated poor oropharyngeal sensation, tracely aspiration and weak cough.; Increasing risk for aspiration. No fever. Mild episode of anxiety and agitation admitted this morning.   Objective: Filed Vitals:   01/02/12 2331 01/03/12 0031 01/03/12 0500 01/03/12 1346  BP: 158/71 154/51 149/68 128/50  Pulse:  88 90 88  Temp:  99.5 F (37.5 C) 99 F (37.2 C) 98.1 F (36.7 C)  TempSrc:  Oral Oral Oral  Resp:  18 20 20   Height:      Weight:      SpO2:  100% 100% 100%    Intake/Output Summary (Last 24 hours) at 01/03/12 1634 Last data filed at 01/03/12 1300  Gross per 24 hour  Intake 1067.5 ml  Output   1800 ml  Net -732.5 ml   Filed Weights   12/28/11 2231 12/29/11 0428  Weight: 90.719 kg (200 lb) 78.6 kg (173 lb 4.5 oz)    Exam:   General:  Pt alert, awake and oriented X 3; cooperative to  examination. Some cough with food ingestion  Cardiovascular: RRR, No MRG  Respiratory: CTA BL, no wheezes  Abdomen: Soft, NT, ND  Neuro: shaky, and anxious; but w/o focal deficit appreciated.  Data Reviewed: Basic Metabolic Panel:  Lab 01/02/12 4540 01/01/12 0433  12/30/11 0425 12/29/11 0120 12/28/11 2257 12/28/11 2251  NA 131* 131* 134* 142 -- 138  K 3.3* 3.2* 3.6 3.5 -- 3.5  CL 97 95* 95* 105 -- 99  CO2 27 27 25 19  -- 23  GLUCOSE 95 97 106* 205* -- 232*  BUN 16 12 8 6  -- 6  CREATININE 0.94 0.70 0.69 0.83 -- 0.89  CALCIUM 8.1* 8.5 8.8 7.9* -- 8.5  MG 1.3* -- 1.3* -- 0.9* --  PHOS -- -- -- -- 2.8 --   Liver Function Tests:  Lab 12/28/11 2251  AST 38*  ALT 24  ALKPHOS 176*  BILITOT 0.3  PROT 7.9  ALBUMIN 3.1*   CBC:  Lab 01/01/12 0433 12/30/11 0425 12/29/11 0120 12/28/11 2251  WBC 9.2 16.1* 6.0 4.8  NEUTROABS -- -- -- 2.8  HGB 11.7* 12.4* 11.6* 12.9*  HCT 33.2* 36.4* 33.2* 36.6*  MCV 93.5 94.3 94.9 94.3  PLT 161 180 222 228   Cardiac Enzymes:  Lab 12/29/11 0120  CKTOTAL --  CKMB --  CKMBINDEX --  TROPONINI <0.30   CBG:  Lab 01/03/12 1126 01/03/12 0822 01/03/12 0456 01/03/12 0448 01/03/12 0420  GLUCAP 92 81 91 82 53*     Studies: No results found.  Scheduled Meds:    . clonazePAM  0.5 mg Oral BID  . enoxaparin (LOVENOX) injection  40 mg Subcutaneous Q24H  . feeding supplement  237 mL Oral BID BM  . folic acid  1 mg Oral Daily  . insulin aspart  0-9 Units Subcutaneous Q4H  . insulin glargine  15 Units Subcutaneous QHS  . isosorbide mononitrate  30 mg Oral Daily  . labetalol  300 mg Oral BID  . levofloxacin  500 mg Oral Daily  . magnesium oxide  400 mg Oral Daily  . multivitamin with minerals  1 tablet Oral Daily  . potassium chloride  40 mEq Oral Daily  . sodium chloride  3 mL Intravenous Q12H  . thiamine  100 mg Oral Daily   Or  . thiamine  100 mg Intravenous Daily   Continuous Infusions:    . sodium chloride 50 mL/hr at 01/03/12 0040    Time spent: > 25 minutes    Campbell Agramonte  [facility] Pager [phone] If 8PM-8AM, please contact night-coverage at [email or web address], password [facility] 01/03/2012, 4:34 PM  LOS: 6 days

## 2012-01-03 NOTE — Progress Notes (Signed)
Hypoglycemic Event  CBG: 53  Treatment: 15 GM carbohydrate snack  Symptoms: Hungry  Follow-up CBG: Time:4:57 AM  CBG Result:91  Possible Reasons for Event: Unknown  Comments/MD notified:    Buel Ream  Remember to initiate Hypoglycemia Order Set & complete

## 2012-01-04 LAB — GLUCOSE, CAPILLARY
Glucose-Capillary: 118 mg/dL — ABNORMAL HIGH (ref 70–99)
Glucose-Capillary: 108 mg/dL — ABNORMAL HIGH (ref 70–99)
Glucose-Capillary: 131 mg/dL — ABNORMAL HIGH (ref 70–99)

## 2012-01-04 MED ORDER — INSULIN GLARGINE 100 UNIT/ML ~~LOC~~ SOLN: 5.0000 [IU] | Freq: Every day | SUBCUTANEOUS | Status: DC

## 2012-01-04 MED ORDER — GLUCOSE 40 % PO GEL: 1.0000 | ORAL | Status: DC | PRN

## 2012-01-04 MED ORDER — GLUCOSE-VITAMIN C 4-6 GM-MG PO CHEW: 4.0000 | CHEWABLE_TABLET | ORAL | Status: DC | PRN

## 2012-01-04 NOTE — Progress Notes (Addendum)
[facility] PROGRESS NOTE  Johnny [name] WUJ:[id number] DOB: December 13, 1934 DOA: 12/28/2011 PCP: Rogelia Boga, MD  Assessment/Plan: Principal Problem:  *Weakness generalized Active Problems:  DIABETES MELLITUS, TYPE II  Anemia of other chronic disease  HYPERTENSION  Tachycardia  Lower extremity weakness  Alcohol withdrawal  Hypertensive urgency SIRs  1. Alcohol Withdrawal - Patient has finished CIWA protocol for detox. At this point slightly shaky but no further signs of withdrawal. -Will start low-dose Klonopin to help with underlying anxiety.  2. Accelerated Hypertension - Well control now. Will continue current regimen  -Will continue labetalol 300mg  BID and imdur. -continue IV hydralazine for PRN. -Will follow VS and make further adjustments as needed.  3. SIRs -Resolved after withdrawal sx's controlled. -Pt is non toxic appearing. -Levaquin started on 12/30/11; will treat for 6 days -patient non febrile  4. Weakness  - Multifactorial likely related to deconditioning, Alcohol dependence, and hypomagnesemia/hyponatremia/hypokalemia. - Physical therapy recommending SNF at discharge; family members and patient in agreement. -Patient feeling better and a little stronger  5. Hypomagnesemia and hypokalemia -will continue potassium and magnesium daily maintenance dose -BMET in am  6. DM - Overall is stable but with episodes of hypoglycemia. At this point will discontinue his lantus; continue SSI and at discharge will use glipizide. A1C 6.5  7. Weakness - Seemed generalized.  - X ray of lumbar spine on 12/29/11 due to c/o lower back discomfort showed no significant change or acute abnormalities. - Patient is better and slightly stronger. Per PT rec's will required SNF at discharge. -continue supportive care  8. Dysphagia -initially thought to be associated with obtunded state from ETOH and ciwa protocol medications. -despite being more alert still having some  cough with food intake. -MBS has demonstrated decrease oropharyngeal sensation and silent aspiration. At this point patient will continue on dysphagia 1 with thin liquids and a specific techniques that has been provided by speech therapy to decrease aspiration. Patient decreased sensation and weak motor oropharyngeal deficit most likely secondary to alcohol.   9. Agitation and anxiety: -Will continue low-dose Klonopin twice a day to help with his symptoms.  Code Status: Full Family Communication: Spoke with patient  Disposition Plan: Pending clinical improvement   Brief narrative: 77 y/o admitted for generalized weakness.  Presenting with alcohol withdrawal.  Currently on CIWA protocol.  Currently awaiting Physical therapy evaluation.  Has SIRs criteria but no active infection and presumed to be secondary to alcohol withdrawal but further work up pending.  Consultants:  None  Procedures:  X ray of lumbar spine  Antibiotics:  levaquin 9/10   HPI/Subjective: Patient AAOX3; no CP or SOB. MBS has demonstrated poor oropharyngeal sensation, tracely aspiration and weak cough, which will increase risk for aspiration. No fever. Anxiety and agitation well controlled now.    Objective: Filed Vitals:   01/04/12 0500 01/04/12 0734 01/04/12 1054 01/04/12 1625  BP: 182/64 169/67 146/77 159/49  Pulse: 80  81 81  Temp: 99 F (37.2 C)   99 F (37.2 C)  TempSrc: Oral   Oral  Resp: 18   20  Height:      Weight: 78.518 kg (173 lb 1.6 oz)     SpO2: 96%   99%    Intake/Output Summary (Last 24 hours) at 01/04/12 1653 Last data filed at 01/04/12 9562  Gross per 24 hour  Intake 1085.83 ml  Output    900 ml  Net 185.83 ml   Filed Weights   12/28/11 2231 12/29/11 0428 01/04/12 0500  Weight: 90.719 kg (200 lb) 78.6 kg (173 lb 4.5 oz) 78.518 kg (173 lb 1.6 oz)    Exam:   General:  Pt alert, awake and oriented X 3; cooperative to examination. Decrease cough with food  ingestion  Cardiovascular: RRR, No MRG  Respiratory: CTA BL, no wheezes  Abdomen: Soft, NT, ND  Neuro: less shaky and less anxious; but w/o focal deficit appreciated.  Data Reviewed: Basic Metabolic Panel:  Lab 01/02/12 1610 01/01/12 0433 12/30/11 0425 12/29/11 0120 12/28/11 2257 12/28/11 2251  NA 131* 131* 134* 142 -- 138  K 3.3* 3.2* 3.6 3.5 -- 3.5  CL 97 95* 95* 105 -- 99  CO2 27 27 25 19  -- 23  GLUCOSE 95 97 106* 205* -- 232*  BUN 16 12 8 6  -- 6  CREATININE 0.94 0.70 0.69 0.83 -- 0.89  CALCIUM 8.1* 8.5 8.8 7.9* -- 8.5  MG 1.3* -- 1.3* -- 0.9* --  PHOS -- -- -- -- 2.8 --   Liver Function Tests:  Lab 12/28/11 2251  AST 38*  ALT 24  ALKPHOS 176*  BILITOT 0.3  PROT 7.9  ALBUMIN 3.1*   CBC:  Lab 01/01/12 0433 12/30/11 0425 12/29/11 0120 12/28/11 2251  WBC 9.2 16.1* 6.0 4.8  NEUTROABS -- -- -- 2.8  HGB 11.7* 12.4* 11.6* 12.9*  HCT 33.2* 36.4* 33.2* 36.6*  MCV 93.5 94.3 94.9 94.3  PLT 161 180 222 228   Cardiac Enzymes:  Lab 12/29/11 0120  CKTOTAL --  CKMB --  CKMBINDEX --  TROPONINI <0.30   CBG:  Lab 01/04/12 1147 01/04/12 0834 01/04/12 0800 01/04/12 0351 01/03/12 2347  GLUCAP 118* 127* 64* 71 77     Studies: No results found.  Scheduled Meds:    . clonazePAM  0.5 mg Oral BID  . enoxaparin (LOVENOX) injection  40 mg Subcutaneous Q24H  . feeding supplement  237 mL Oral BID BM  . folic acid  1 mg Oral Daily  . influenza  inactive virus vaccine  0.5 mL Intramuscular Tomorrow-1000  . insulin aspart  0-9 Units Subcutaneous Q4H  . insulin glargine  5 Units Subcutaneous QHS  . isosorbide mononitrate  30 mg Oral Daily  . labetalol  300 mg Oral BID  . levofloxacin  500 mg Oral Daily  . magnesium oxide  400 mg Oral Daily  . multivitamin with minerals  1 tablet Oral Daily  . potassium chloride  40 mEq Oral Daily  . sodium chloride  3 mL Intravenous Q12H  . thiamine  100 mg Oral Daily   Or  . thiamine  100 mg Intravenous Daily  . DISCONTD: insulin  glargine  8 Units Subcutaneous QHS   Continuous Infusions:    . sodium chloride 50 mL/hr at 01/03/12 0040    Time spent: > 25 minutes    Johnny [name]  [facility] Pager [phone] If 8PM-8AM, please contact night-coverage at [email or web address], password [facility] 01/04/2012, 4:53 PM  LOS: 7 days

## 2012-01-04 NOTE — Progress Notes (Signed)
Hypoglycemic Event  CBG: 64  Treatment: 15 GM carbohydrate snack/breakfast Symptoms: Hungry  Follow-up CBG: Time:0830 CBG Result:127  Possible Reasons for Event: Unknown  Comments/MD notified:Dr. Gwenlyn Perking text paged of event and results at 0845. Awaiting MD call back.    Johnny [name]  Remember to initiate Hypoglycemia Order Set & complete

## 2012-01-04 NOTE — Progress Notes (Signed)
Dr. Gwenlyn Perking aware via phone of pt's hypoglycemic event this am. MD to enter new orders into EPIC. None received at this time.

## 2012-01-05 DIAGNOSIS — D638 Anemia in other chronic diseases classified elsewhere: Secondary | ICD-10-CM

## 2012-01-05 LAB — GLUCOSE, CAPILLARY: Glucose-Capillary: 99 mg/dL (ref 70–99)

## 2012-01-05 LAB — BASIC METABOLIC PANEL
CO2: 27 mEq/L (ref 19–32)
Chloride: 98 mEq/L (ref 96–112)
GFR calc Af Amer: >90 mL/min (ref >90)
Sodium: 134 mEq/L — ABNORMAL LOW (ref 135–145)

## 2012-01-05 MED ORDER — GLUCERNA SHAKE PO LIQD: 237.0000 mL | Freq: Two times a day (BID) | ORAL | Status: DC

## 2012-01-05 MED ORDER — CLONAZEPAM 0.5 MG PO TABS: 0.5000 mg | ORAL_TABLET | Freq: Two times a day (BID) | ORAL | Status: DC

## 2012-01-05 MED ORDER — SACCHAROMYCES BOULARDII 250 MG PO CAPS: 250.0000 mg | ORAL_CAPSULE | Freq: Two times a day (BID) | ORAL | Status: AC

## 2012-01-05 MED ORDER — FOLIC ACID 1 MG PO TABS: 1.0000 mg | ORAL_TABLET | Freq: Every day | ORAL | Status: DC

## 2012-01-05 MED ORDER — ADULT MULTIVITAMIN W/MINERALS CH: 1.0000 | ORAL_TABLET | Freq: Every day | ORAL | Status: DC

## 2012-01-05 MED ORDER — MAGNESIUM OXIDE 400 (241.3 MG) MG PO TABS: 400.0000 mg | ORAL_TABLET | Freq: Every day | ORAL | Status: DC

## 2012-01-05 MED ORDER — LABETALOL HCL 300 MG PO TABS: 300.0000 mg | ORAL_TABLET | Freq: Two times a day (BID) | ORAL | Status: DC

## 2012-01-05 MED ORDER — ISOSORBIDE MONONITRATE ER 30 MG PO TB24: 30.0000 mg | ORAL_TABLET | Freq: Every day | ORAL | Status: DC

## 2012-01-05 MED ORDER — THIAMINE HCL 100 MG PO TABS: 100.0000 mg | ORAL_TABLET | Freq: Every day | ORAL | Status: DC

## 2012-01-05 MED ORDER — GLIPIZIDE 5 MG PO TABS: 10.0000 mg | ORAL_TABLET | Freq: Two times a day (BID) | ORAL | Status: DC

## 2012-01-05 NOTE — Discharge Instructions (Signed)
Arterial Hypertension Arterial hypertension (high blood pressure) is a condition of elevated pressure in your blood vessels. Hypertension over a long period of time is a risk factor for strokes, heart attacks, and heart failure. It is also the leading cause of kidney (renal) failure.  CAUSES   In Adults -- Over 90% of all hypertension has no known cause. This is called essential or primary hypertension. In the other 10% of people with hypertension, the increase in blood pressure is caused by another disorder. This is called secondary hypertension. Important causes of secondary hypertension are:   Heavy alcohol use.   Obstructive sleep apnea.   Hyperaldosterosim (Conn's syndrome).   Steroid use.   Chronic kidney failure.   Hyperparathyroidism.   Medications.   Renal artery stenosis.   Pheochromocytoma.   Cushing's disease.   Coarctation of the aorta.   Scleroderma renal crisis.   Licorice (in excessive amounts).   Drugs (cocaine, methamphetamine).  Your caregiver can explain any items above that apply to you.  In Children -- Secondary hypertension is more common and should always be considered.   Pregnancy -- Few women of childbearing age have high blood pressure. However, up to 10% of them develop hypertension of pregnancy. Generally, this will not harm the woman. It may be a sign of 3 complications of pregnancy: preeclampsia, HELLP syndrome, and eclampsia. Follow up and control with medication is necessary.  SYMPTOMS   This condition normally does not produce any noticeable symptoms. It is usually found during a routine exam.   Malignant hypertension is a late problem of high blood pressure. It may have the following symptoms:   Headaches.   Blurred vision.   End-organ damage (this means your kidneys, heart, lungs, and other organs are being damaged).   Stressful situations can increase the blood pressure. If a person with normal blood pressure has their blood  pressure go up while being seen by their caregiver, this is often termed "white coat hypertension." Its importance is not known. It may be related with eventually developing hypertension or complications of hypertension.   Hypertension is often confused with mental tension, stress, and anxiety.  DIAGNOSIS  The diagnosis is made by 3 separate blood pressure measurements. They are taken at least 1 week apart from each other. If there is organ damage from hypertension, the diagnosis may be made without repeat measurements. Hypertension is usually identified by having blood pressure readings:  Above 140/90 mmHg measured in both arms, at 3 separate times, over a couple weeks.   Over 130/80 mmHg should be considered a risk factor and may require treatment in patients with diabetes.  Blood pressure readings over 120/80 mmHg are called "pre-hypertension" even in non-diabetic patients. To get a true blood pressure measurement, use the following guidelines. Be aware of the factors that can alter blood pressure readings.  Take measurements at least 1 hour after caffeine.   Take measurements 30 minutes after smoking and without any stress. This is another reason to quit smoking - it raises your blood pressure.   Use a proper cuff size. Ask your caregiver if you are not sure about your cuff size.   Most home blood pressure cuffs are automatic. They will measure systolic and diastolic pressures. The systolic pressure is the pressure reading at the start of sounds. Diastolic pressure is the pressure at which the sounds disappear. If you are elderly, measure pressures in multiple postures. Try sitting, lying or standing.   Sit at rest for a minimum of   5 minutes before taking measurements.   You should not be on any medications like decongestants. These are found in many cold medications.   Record your blood pressure readings and review them with your caregiver.  If you have hypertension:  Your caregiver  may do tests to be sure you do not have secondary hypertension (see "causes" above).   Your caregiver may also look for signs of metabolic syndrome. This is also called Syndrome X or Insulin Resistance Syndrome. You may have this syndrome if you have type 2 diabetes, abdominal obesity, and abnormal blood lipids in addition to hypertension.   Your caregiver will take your medical and family history and perform a physical exam.   Diagnostic tests may include blood tests (for glucose, cholesterol, potassium, and kidney function), a urinalysis, or an EKG. Other tests may also be necessary depending on your condition.  PREVENTION  There are important lifestyle issues that you can adopt to reduce your chance of developing hypertension:  Maintain a normal weight.   Limit the amount of salt (sodium) in your diet.   Exercise often.   Limit alcohol intake.   Get enough potassium in your diet. Discuss specific advice with your caregiver.   Follow a DASH diet (dietary approaches to stop hypertension). This diet is rich in fruits, vegetables, and low-fat dairy products, and avoids certain fats.  PROGNOSIS  Essential hypertension cannot be cured. Lifestyle changes and medical treatment can lower blood pressure and reduce complications. The prognosis of secondary hypertension depends on the underlying cause. Many people whose hypertension is controlled with medicine or lifestyle changes can live a normal, healthy life.  RISKS AND COMPLICATIONS  While high blood pressure alone is not an illness, it often requires treatment due to its short- and long-term effects on many organs. Hypertension increases your risk for:  CVAs or strokes (cerebrovascular accident).   Heart failure due to chronically high blood pressure (hypertensive cardiomyopathy).   Heart attack (myocardial infarction).   Damage to the retina (hypertensive retinopathy).   Kidney failure (hypertensive nephropathy).  Your caregiver can  explain list items above that apply to you. Treatment of hypertension can significantly reduce the risk of complications. TREATMENT   For overweight patients, weight loss and regular exercise are recommended. Physical fitness lowers blood pressure.   Mild hypertension is usually treated with diet and exercise. A diet rich in fruits and vegetables, fat-free dairy products, and foods low in fat and salt (sodium) can help lower blood pressure. Decreasing salt intake decreases blood pressure in a 1/3 of people.   Stop smoking if you are a smoker.  The steps above are highly effective in reducing blood pressure. While these actions are easy to suggest, they are difficult to achieve. Most patients with moderate or severe hypertension end up requiring medications to bring their blood pressure down to a normal level. There are several classes of medications for treatment. Blood pressure pills (antihypertensives) will lower blood pressure by their different actions. Lowering the blood pressure by 10 mmHg may decrease the risk of complications by as much as 25%. The goal of treatment is effective blood pressure control. This will reduce your risk for complications. Your caregiver will help you determine the best treatment for you according to your lifestyle. What is excellent treatment for one person, may not be for you. HOME CARE INSTRUCTIONS   Do not smoke.   Follow the lifestyle changes outlined in the "Prevention" section.   If you are on medications, follow the directions   carefully. Blood pressure medications must be taken as prescribed. Skipping doses reduces their benefit. It also puts you at risk for problems.   Follow up with your caregiver, as directed.   If you are asked to monitor your blood pressure at home, follow the guidelines in the "Diagnosis" section above.  SEEK MEDICAL CARE IF:   You think you are having medication side effects.   You have recurrent headaches or lightheadedness.     You have swelling in your ankles.   You have trouble with your vision.  SEEK IMMEDIATE MEDICAL CARE IF:   You have sudden onset of chest pain or pressure, difficulty breathing, or other symptoms of a heart attack.   You have a severe headache.   You have symptoms of a stroke (such as sudden weakness, difficulty speaking, difficulty walking).  MAKE SURE YOU:   Understand these instructions.   Will watch your condition.   Will get help right away if you are not doing well or get worse.  Document Released: 04/07/2005 Document Revised: 03/27/2011 Document Reviewed: 11/05/2006 [employer] Patient Information 2012 [employer]. 
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
## 2012-01-05 NOTE — Progress Notes (Signed)
Physical Therapy Treatment Patient Details Name: Johnny [name] MRN: [id number] DOB: [birth year] Today's Date: 01/05/2012 Time: 9147-8295 PT Time Calculation (min): 16 min  PT Assessment / Plan / Recommendation Comments on Treatment Session  Assisted pt OOB amd amb with EVA Walker for increased support.  Very unsteady gait with limited activity tolerance.  HIGH FALL RISK.    Follow Up Recommendations  Skilled nursing facility    Barriers to Discharge        Equipment Recommendations  Defer to next venue    Recommendations for Other Services    Frequency Min 3X/week   Plan Discharge plan remains appropriate    Precautions / Restrictions     Pertinent Vitals/Pain C/o L front/mid thigh pain during gait No obvious issue     Mobility  Bed Mobility Bed Mobility: Supine to Sit;Sit to Supine Supine to Sit: 3: Mod assist Sit to Supine: 3: Mod assist Details for Bed Mobility Assistance: increased time to process and increased time to complete tasks  Transfers Transfers: Sit to Stand;Stand to Sit Sit to Stand: 1: +1 Total assist Sit to Stand: Patient Percentage: 70% Stand to Sit: 1: +1 Total assist Stand to Sit: Patient Percentage: 70% Details for Transfer Assistance: Pt unable to push self up from bed and used walker to pull up.   Ambulation/Gait Ambulation/Gait Assistance: 1: +2 Total assist Ambulation/Gait: Patient Percentage: 70% Ambulation Distance (Feet): 45 Feet Assistive device: Eva walker Ambulation/Gait Assistance Details: used EVA walker for increased support and increase upright posture.  Pt amb with flex hips and knees and trunk forward.  Pt states, "this is how I walk"  Very unsteady, drunken gait with delayed corrective reaction response.  HIGH FALL RISK. Gait Pattern: Step-to pattern;Shuffle;Trunk flexed;Narrow base of support Gait velocity: decreased     PT Goals                       progressing    Visit Information  Last PT Received On: 01/05/12 Assistance  Needed: +2                   End of Session PT - End of Session Equipment Utilized During Treatment: Gait belt Activity Tolerance: Patient limited by fatigue Patient left: in bed;with call bell/phone within reach   Felecia Shelling  PTA [facility]  Acute  Rehab Pager     [phone]

## 2012-01-05 NOTE — Progress Notes (Signed)
CSW met with patient. Patient is alert and oriented X3. Discussed SNF placement with patient. Patient insists that he is going home. He refuses snf. CSW discussed with MD. CSW called and spoke with patients son. He states that he will come get patient and make sure that someone is always with patient.  Miasia Crabtree C. Filemon Breton MSW, LCSW [phone]

## 2012-01-16 ENCOUNTER — Telehealth: Payer: Self-pay | Admitting: Internal Medicine

## 2012-01-16 MED ORDER — LABETALOL HCL 300 MG PO TABS: 300.0000 mg | ORAL_TABLET | Freq: Two times a day (BID) | ORAL | Status: DC

## 2012-01-16 NOTE — Telephone Encounter (Signed)
Pts daughter called and said that her dad has been in hospital and was discharged approx 1 wk ago. Pt needs to get a refill of bp med (unsure of med name) [facility] [address]. Pt is completely out of med.

## 2012-01-16 NOTE — Telephone Encounter (Signed)
done

## 2012-02-18 ENCOUNTER — Telehealth: Payer: Self-pay | Admitting: Internal Medicine

## 2012-02-18 ENCOUNTER — Encounter: Payer: Self-pay | Admitting: Internal Medicine

## 2012-02-18 ENCOUNTER — Ambulatory Visit (INDEPENDENT_AMBULATORY_CARE_PROVIDER_SITE_OTHER): Payer: Medicare PPO | Admitting: Internal Medicine

## 2012-02-18 VITALS — BP 200/95 | Temp 97.7°F | Wt 176.0 lb

## 2012-02-18 DIAGNOSIS — I10 Essential (primary) hypertension: Secondary | ICD-10-CM

## 2012-02-18 DIAGNOSIS — E119 Type 2 diabetes mellitus without complications: Secondary | ICD-10-CM

## 2012-02-18 DIAGNOSIS — I2510 Atherosclerotic heart disease of native coronary artery without angina pectoris: Secondary | ICD-10-CM

## 2012-02-18 DIAGNOSIS — I160 Hypertensive urgency: Secondary | ICD-10-CM

## 2012-02-18 MED ORDER — AMLODIPINE BESYLATE 5 MG PO TABS: 5.0000 mg | ORAL_TABLET | Freq: Every day | ORAL | Status: DC

## 2012-02-18 NOTE — Progress Notes (Signed)
Subjective:    Patient ID: Johnny [name], male    DOB: 06/15/1934, 77 y.o.   MRN: [id number]  HPI  77 year old patient who is seen today for post hospital followup. He was discharged in the hospital approximately 6 weeks ago. He was treated for EtOH withdrawal accelerated hypertension. He has a history of CAD paroxysmal atrial fibrillation as well as type 2 diabetes. He states that he has been abstinent. His only discharge blood pressure medication was labetalol but he states that he has only taken this once daily. His blood pressure has required multiple medications in the past. He states he feels well today. His only complaint is some low back pain.  Hospital records reviewed  Past Medical History  Diagnosis Date  . Acute alcoholic hepatitis 05/20/2010  . Anemia of other chronic disease 08/16/2007  . Atrial fibrillation 08/12/2007  . CORONARY ARTERY DISEASE 12/11/2006  . DIABETES MELLITUS, TYPE II 12/11/2006  . HYPERTENSION 12/11/2006  . Hyperlipidemia   . ETOH abuse   . Chronic low back pain     History   Social History  . Marital Status: Married    Spouse Name: N/A    Number of Children: N/A  . Years of Education: N/A   Occupational History  . Not on file.   Social History Main Topics  . Smoking status: Former Smoker    Quit date: 04/22/1983  . Smokeless tobacco: Not on file  . Alcohol Use: Yes     hx of ETOH  . Drug Use: No  . Sexually Active: Not on file   Other Topics Concern  . Not on file   Social History Narrative  . No narrative on file    Past Surgical History  Procedure Date  . Coronary artery bypass graft     Family History  Problem Relation Age of Onset  . Heart failure Mother   . Aneurysm Sister   . Suicidality Brother   . Heart failure Brother   . Diabetes Sister     Allergies  Allergen Reactions  . Sulfa Antibiotics Other (See Comments)    unknown    Current Outpatient Prescriptions on File Prior to Visit  Medication Sig Dispense Refill    . clonazePAM (KLONOPIN) 0.5 MG tablet Take 1 tablet (0.5 mg total) by mouth 2 (two) times daily.  30 tablet  0  . feeding supplement (GLUCERNA SHAKE) LIQD Take 237 mLs by mouth 2 (two) times daily between meals.      . folic acid (FOLVITE) 1 MG tablet Take 1 tablet (1 mg total) by mouth daily.  30 tablet  1  . glipiZIDE (GLUCOTROL) 5 MG tablet Take 2 tablets (10 mg total) by mouth 2 (two) times daily before a meal.  60 tablet  1  . isosorbide mononitrate (IMDUR) 30 MG 24 hr tablet Take 1 tablet (30 mg total) by mouth daily.  30 tablet  1  . labetalol (NORMODYNE) 300 MG tablet Take 1 tablet (300 mg total) by mouth 2 (two) times daily.  60 tablet  1  . magnesium oxide (MAG-OX) 400 (241.3 MG) MG tablet Take 1 tablet (400 mg total) by mouth daily.  30 tablet  1  . Multiple Vitamin (MULTIVITAMIN WITH MINERALS) TABS Take 1 tablet by mouth daily.      Marland Kitchen thiamine 100 MG tablet Take 1 tablet (100 mg total) by mouth daily.  30 tablet  1    BP 200/95  Temp 97.7 F (36.5 C) (Oral)  Wt  176 lb (79.833 kg)       Review of Systems  Constitutional: Positive for fatigue. Negative for fever, chills and appetite change.  HENT: Negative for hearing loss, ear pain, congestion, sore throat, trouble swallowing, neck stiffness, dental problem, voice change and tinnitus.   Eyes: Negative for pain, discharge and visual disturbance.  Respiratory: Negative for cough, chest tightness, wheezing and stridor.   Cardiovascular: Negative for chest pain, palpitations and leg swelling.  Gastrointestinal: Negative for nausea, vomiting, abdominal pain, diarrhea, constipation, blood in stool and abdominal distention.  Genitourinary: Negative for urgency, hematuria, flank pain, discharge, difficulty urinating and genital sores.  Musculoskeletal: Negative for myalgias, back pain, joint swelling, arthralgias and gait problem.  Skin: Negative for rash.  Neurological: Positive for weakness. Negative for dizziness, syncope,  speech difficulty, numbness and headaches.  Hematological: Negative for adenopathy. Does not bruise/bleed easily.  Psychiatric/Behavioral: Negative for behavioral problems and dysphoric mood. The patient is not nervous/anxious.        Objective:   Physical Exam  Constitutional: He is oriented to person, place, and time. He appears well-developed.       Blood pressure 200/95 in the right arm Appears somewhat weak but alert and appropriate Able to ambulate from a sitting position to the examining table without assistance. Somewhat slow and deliberate with a stooped posture  HENT:  Head: Normocephalic.  Right Ear: External ear normal.  Left Ear: External ear normal.  Eyes: Conjunctivae normal and EOM are normal.  Neck: Normal range of motion.  Cardiovascular: Normal rate and normal heart sounds.   Pulmonary/Chest: Breath sounds normal.  Abdominal: Bowel sounds are normal.  Musculoskeletal: Normal range of motion. He exhibits no edema and no tenderness.  Neurological: He is alert and oriented to person, place, and time.  Psychiatric: He has a normal mood and affect. His behavior is normal.          Assessment & Plan:   Alcoholism Noncompliance Hypertension poor control. Compliance stressed he was told to take labetalol twice daily. We'll add amlodipine 5 mg back to his regimen. Recheck in 2 weeks Diabetes mellitus stable

## 2012-02-18 NOTE — Telephone Encounter (Signed)
Attempt to call- no ans no mach - will try in AM

## 2012-02-18 NOTE — Telephone Encounter (Signed)
Please advise 

## 2012-02-18 NOTE — Telephone Encounter (Signed)
Suggest Tylenol 2 tablets every 6 hours as needed for pain not to exceed 6 tablets per day

## 2012-02-18 NOTE — Telephone Encounter (Signed)
Patient was in the office and upon check out stated that he forgot to tell the MD that he needs something for [facility] [address]. Please advise/assist.

## 2012-02-18 NOTE — Patient Instructions (Addendum)
Limit your sodium (Salt) intake  Please check your blood pressure on a regular basis.  If it is consistently greater than 150/90, please make an office appointment.  Avoid all alcohol use  Return office visit in 2 weeks

## 2012-02-19 NOTE — Telephone Encounter (Signed)
Attempt to call again - no ans no mach 

## 2012-02-25 ENCOUNTER — Emergency Department (HOSPITAL_COMMUNITY)
Admission: EM | Admit: 2012-02-25 | Discharge: 2012-02-25 | Disposition: A | Discharge status: Home / Self Care | Payer: Medicare PPO | Attending: Emergency Medicine | Admitting: Emergency Medicine

## 2012-02-25 ENCOUNTER — Encounter (HOSPITAL_COMMUNITY): Payer: Self-pay

## 2012-02-25 DIAGNOSIS — I10 Essential (primary) hypertension: Secondary | ICD-10-CM | POA: Insufficient documentation

## 2012-02-25 DIAGNOSIS — I4891 Unspecified atrial fibrillation: Secondary | ICD-10-CM | POA: Insufficient documentation

## 2012-02-25 DIAGNOSIS — E119 Type 2 diabetes mellitus without complications: Secondary | ICD-10-CM | POA: Insufficient documentation

## 2012-02-25 DIAGNOSIS — F1010 Alcohol abuse, uncomplicated: Secondary | ICD-10-CM | POA: Insufficient documentation

## 2012-02-25 DIAGNOSIS — K7010 Alcoholic hepatitis without ascites: Secondary | ICD-10-CM | POA: Insufficient documentation

## 2012-02-25 DIAGNOSIS — D638 Anemia in other chronic diseases classified elsewhere: Secondary | ICD-10-CM | POA: Insufficient documentation

## 2012-02-25 DIAGNOSIS — E860 Dehydration: Secondary | ICD-10-CM

## 2012-02-25 DIAGNOSIS — E162 Hypoglycemia, unspecified: Secondary | ICD-10-CM

## 2012-02-25 DIAGNOSIS — Z87891 Personal history of nicotine dependence: Secondary | ICD-10-CM | POA: Insufficient documentation

## 2012-02-25 DIAGNOSIS — Z791 Long term (current) use of non-steroidal anti-inflammatories (NSAID): Secondary | ICD-10-CM | POA: Insufficient documentation

## 2012-02-25 DIAGNOSIS — G8929 Other chronic pain: Secondary | ICD-10-CM | POA: Insufficient documentation

## 2012-02-25 DIAGNOSIS — E785 Hyperlipidemia, unspecified: Secondary | ICD-10-CM | POA: Insufficient documentation

## 2012-02-25 DIAGNOSIS — I2510 Atherosclerotic heart disease of native coronary artery without angina pectoris: Secondary | ICD-10-CM | POA: Insufficient documentation

## 2012-02-25 LAB — CBC WITH DIFFERENTIAL/PLATELET
Basophils Relative: 0 % (ref 0–1)
Eosinophils Absolute: 0.1 10*3/uL (ref 0.0–0.7)
Eosinophils Relative: 2 % (ref 0–5)
Hemoglobin: 11 g/dL — ABNORMAL LOW (ref 13.0–17.0)
MCH: 32.5 pg (ref 26.0–34.0)
MCHC: 35 g/dL (ref 30.0–36.0)
Monocytes Relative: 15 % — ABNORMAL HIGH (ref 3–12)
Neutrophils Relative %: 43 % (ref 43–77)
Platelets: 157 10*3/uL (ref 150–400)

## 2012-02-25 LAB — GLUCOSE, CAPILLARY
Glucose-Capillary: 227 mg/dL — ABNORMAL HIGH (ref 70–99)
Glucose-Capillary: 56 mg/dL — ABNORMAL LOW (ref 70–99)
Glucose-Capillary: 188 mg/dL — ABNORMAL HIGH (ref 70–99)
Glucose-Capillary: 194 mg/dL — ABNORMAL HIGH (ref 70–99)

## 2012-02-25 LAB — POCT I-STAT, CHEM 8
Calcium, Ion: 1.22 mmol/L (ref 1.13–1.30)
Creatinine, Ser: 1.4 mg/dL — ABNORMAL HIGH (ref 0.50–1.35)
Glucose, Bld: 161 mg/dL — ABNORMAL HIGH (ref 70–99)
HCT: 33 % — ABNORMAL LOW (ref 39.0–52.0)
HCT: 32 % — ABNORMAL LOW (ref 39.0–52.0)
Hemoglobin: 11.2 g/dL — ABNORMAL LOW (ref 13.0–17.0)
Hemoglobin: 10.9 g/dL — ABNORMAL LOW (ref 13.0–17.0)
Potassium: 3.4 mEq/L — ABNORMAL LOW (ref 3.5–5.1)
Potassium: 3.8 mEq/L (ref 3.5–5.1)
Sodium: 144 mEq/L (ref 135–145)

## 2012-02-25 LAB — URINALYSIS, ROUTINE W REFLEX MICROSCOPIC
Hgb urine dipstick: NEGATIVE
Leukocytes, UA: NEGATIVE
Nitrite: NEGATIVE
Specific Gravity, Urine: 1.018 (ref 1.005–1.030)
Urobilinogen, UA: 0.2 mg/dL (ref 0.0–1.0)

## 2012-02-25 LAB — RAPID URINE DRUG SCREEN, HOSP PERFORMED
Opiates: NOT DETECTED
Tetrahydrocannabinol: NOT DETECTED

## 2012-02-25 MED ORDER — ACETAMINOPHEN 325 MG PO TABS
325.0000 mg | ORAL_TABLET | Freq: Once | ORAL | Status: AC
  Administered 2012-02-25: 325 mg via ORAL
  Filled 2012-02-25: qty 1

## 2012-02-25 MED ORDER — SODIUM CHLORIDE 0.9 % IV BOLUS (SEPSIS)
1000.0000 mL | Freq: Once | INTRAVENOUS | Status: AC
  Administered 2012-02-25: 1000 mL via INTRAVENOUS

## 2012-02-25 MED ORDER — IBUPROFEN 200 MG PO TABS
400.0000 mg | ORAL_TABLET | Freq: Once | ORAL | Status: AC
  Administered 2012-02-25: 400 mg via ORAL
  Filled 2012-02-25: qty 2

## 2012-02-25 MED ORDER — DEXTROSE 50 % IV SOLN
1.0000 | Freq: Once | INTRAVENOUS | Status: AC
  Administered 2012-02-25: 50 mL via INTRAVENOUS
  Filled 2012-02-25: qty 50

## 2012-02-25 MED ORDER — SODIUM CHLORIDE 0.9 % IV SOLN
Freq: Once | INTRAVENOUS | Status: AC
  Administered 2012-02-25: 04:00:00 via INTRAVENOUS

## 2012-02-25 MED ORDER — GLUCAGON HCL (RDNA) 1 MG IJ SOLR
INTRAMUSCULAR | Status: AC
  Administered 2012-02-25: 04:00:00
  Filled 2012-02-25: qty 1

## 2012-02-25 NOTE — ED Notes (Signed)
YTK:[id number]<UX> Expected date:02/25/12<BR> Expected time: 3:17 AM<BR> Means of arrival:Ambulance<BR> Comments:<BR> Hypoglycemia; combative

## 2012-02-25 NOTE — ED Notes (Signed)
Family contact information: Gerre Pebbles (nephew) [phone].

## 2012-02-25 NOTE — ED Provider Notes (Signed)
Patient monitored in the ER for over 4 hours. Repeat i-stat shows that creatinine improved with fluids. His sugars are stable as well. He is a diabetic and is unsure why his sugar dropped. He has an appointment with Dr. Amador Cunas on the 13th of this month. I have asked him to call the office to see if he can be seen sooner. Ive also advised him to be careful with his medicines and check his sugars regularly. I also told him to be drinking a lot of fluids because he is dehydrated.  Pt has been advised of the symptoms that warrant their return to the ED. Patient has voiced understanding and has agreed to follow-up with the PCP or specialist.   Dorthula Matas, PA 02/25/12 0840

## 2012-02-25 NOTE — ED Notes (Signed)
Per EMS: Pt picked up at home with c/o hypoglycemia. On scene pt behavior was very dramatic, not cooperating with EMS for care. Pt given 31g oral glucose that increase BG to 43 and 1ml of glucagon IM. Pt behavior en route was cambative. Unable to obtain IV access. Pt alert and orient.

## 2012-02-25 NOTE — ED Provider Notes (Signed)
History     CSN: [id number]  Arrival date & time 02/25/12  0340   First MD Initiated Contact with Patient 02/25/12 [phone]      Chief Complaint  Patient presents with  . Hypoglycemia    (Consider location/radiation/quality/duration/timing/severity/associated sxs/prior treatment) HPI Comments: Patient states, that he has not been eating well for the last several, days.  Denies nausea, vomiting, diarrhea, cold symptoms, coughing, fever.  EMS was called to the house because patient was "not acting right" his blood sugar at that time was 30.  He was given oral glucose, with a small increase in his blood sugar.  He was given IM glucagon.  On arrival to the emergency department his blood sugar  was 56.  He was able to communicate at that time and cooperate with assessment  The history is provided by the patient and the EMS personnel.    Past Medical History  Diagnosis Date  . Acute alcoholic hepatitis 05/20/2010  . Anemia of other chronic disease 08/16/2007  . Atrial fibrillation 08/12/2007  . CORONARY ARTERY DISEASE 12/11/2006  . DIABETES MELLITUS, TYPE II 12/11/2006  . HYPERTENSION 12/11/2006  . Hyperlipidemia   . ETOH abuse   . Chronic low back pain     Past Surgical History  Procedure Date  . Coronary artery bypass graft     Family History  Problem Relation Age of Onset  . Heart failure Mother   . Aneurysm Sister   . Suicidality Brother   . Heart failure Brother   . Diabetes Sister     History  Substance Use Topics  . Smoking status: Former Smoker    Quit date: 04/22/1983  . Smokeless tobacco: Not on file  . Alcohol Use: Yes     Comment: hx of ETOH      Review of Systems  Constitutional: Negative for fever and chills.  HENT: Negative for rhinorrhea.   Respiratory: Negative for shortness of breath.   Cardiovascular: Negative for chest pain and palpitations.  Gastrointestinal: Negative for nausea, vomiting, abdominal pain and diarrhea.  Genitourinary: Negative for  dysuria.  Musculoskeletal: Negative for myalgias.  Neurological: Positive for tremors. Negative for dizziness, weakness and headaches.    Allergies  Review of patient's allergies indicates no known allergies.  Home Medications   Current Outpatient Rx  Name  Route  Sig  Dispense  Refill  . ACETAMINOPHEN 500 MG PO TABS   Oral   Take 1,000 mg by mouth every 6 (six) hours as needed. For pain         . AMLODIPINE BESYLATE 5 MG PO TABS   Oral   Take 1 tablet (5 mg total) by mouth daily.   90 tablet   3   . CLONAZEPAM 0.5 MG PO TABS   Oral   Take 1 tablet (0.5 mg total) by mouth 2 (two) times daily.   30 tablet   0   . FOLIC ACID 1 MG PO TABS   Oral   Take 1 tablet (1 mg total) by mouth daily.   30 tablet   1   . GLIPIZIDE 5 MG PO TABS   Oral   Take 2 tablets (10 mg total) by mouth 2 (two) times daily before a meal.   60 tablet   1   . ISOSORBIDE MONONITRATE ER 30 MG PO TB24   Oral   Take 1 tablet (30 mg total) by mouth daily.   30 tablet   1   . LABETALOL HCL 300  MG PO TABS   Oral   Take 1 tablet (300 mg total) by mouth 2 (two) times daily.   60 tablet   1   . MAGNESIUM OXIDE 400 (241.3 MG) MG PO TABS   Oral   Take 1 tablet (400 mg total) by mouth daily.   30 tablet   1   . ADULT MULTIVITAMIN W/MINERALS CH   Oral   Take 1 tablet by mouth daily.         . THIAMINE HCL 100 MG PO TABS   Oral   Take 1 tablet (100 mg total) by mouth daily.   30 tablet   1     BP 166/42  Pulse 88  Temp 98.1 F (36.7 C) (Oral)  Resp 18  Ht 5\' 11"  (1.803 m)  Wt 174 lb (78.926 kg)  BMI 24.27 kg/m2  SpO2 100%  Physical Exam  Constitutional: He appears well-developed and well-nourished.  HENT:  Head: Normocephalic.  Eyes: Pupils are equal, round, and reactive to light.  Neck: Normal range of motion.  Cardiovascular: Normal rate and regular rhythm.   Pulmonary/Chest: Effort normal. He has no wheezes.  Abdominal: Soft. He exhibits no distension. There is no  tenderness.  Musculoskeletal: Normal range of motion. He exhibits no tenderness.  Neurological: He is alert.  Skin: Skin is warm and dry.    ED Course  Procedures (including critical care time)  Labs Reviewed  GLUCOSE, CAPILLARY - Abnormal; Notable for the following:    Glucose-Capillary 56 (*)     All other components within normal limits  CBC WITH DIFFERENTIAL - Abnormal; Notable for the following:    RBC 3.38 (*)     Hemoglobin 11.0 (*)     HCT 31.4 (*)     Monocytes Relative 15 (*)     All other components within normal limits  URINALYSIS, ROUTINE W REFLEX MICROSCOPIC - Abnormal; Notable for the following:    APPearance CLOUDY (*)     Glucose, UA 250 (*)     All other components within normal limits  POCT I-STAT, CHEM 8 - Abnormal; Notable for the following:    Potassium 3.4 (*)     Creatinine, Ser 1.40 (*)     Glucose, Bld 58 (*)     Hemoglobin 11.2 (*)     HCT 33.0 (*)     All other components within normal limits  GLUCOSE, CAPILLARY - Abnormal; Notable for the following:    Glucose-Capillary 227 (*)     All other components within normal limits  GLUCOSE, CAPILLARY - Abnormal; Notable for the following:    Glucose-Capillary 188 (*)     All other components within normal limits  GLUCOSE, CAPILLARY - Abnormal; Notable for the following:    Glucose-Capillary 194 (*)     All other components within normal limits  POCT I-STAT, CHEM 8 - Abnormal; Notable for the following:    Glucose, Bld 161 (*)     Hemoglobin 10.9 (*)     HCT 32.0 (*)     All other components within normal limits  ETHANOL  URINE RAPID DRUG SCREEN (HOSP PERFORMED)  LAB REPORT - SCANNED   No results found.   1. Hypoglycemia   2. Dehydration       MDM          Arman Filter, NP 03/01/12 [phone]

## 2012-02-29 NOTE — ED Provider Notes (Signed)
Medical screening examination/treatment/procedure(s) were performed by non-physician practitioner and as supervising physician I was immediately available for consultation/collaboration.   Loren Racer, MD 02/29/12 1430

## 2012-03-01 NOTE — ED Provider Notes (Signed)
Medical screening examination/treatment/procedure(s) were performed by non-physician practitioner and as supervising physician I was immediately available for consultation/collaboration.   Loren Racer, MD 03/01/12 1004

## 2012-03-03 ENCOUNTER — Ambulatory Visit (INDEPENDENT_AMBULATORY_CARE_PROVIDER_SITE_OTHER): Payer: Medicare PPO | Admitting: Internal Medicine

## 2012-03-03 ENCOUNTER — Encounter: Payer: Self-pay | Admitting: Internal Medicine

## 2012-03-03 VITALS — BP 160/76 | HR 80 | Temp 97.8°F | Resp 20 | Wt 183.0 lb

## 2012-03-03 DIAGNOSIS — I10 Essential (primary) hypertension: Secondary | ICD-10-CM

## 2012-03-03 DIAGNOSIS — I4891 Unspecified atrial fibrillation: Secondary | ICD-10-CM

## 2012-03-03 DIAGNOSIS — E119 Type 2 diabetes mellitus without complications: Secondary | ICD-10-CM

## 2012-03-03 MED ORDER — GLIPIZIDE ER 5 MG PO TB24: 5.0000 mg | ORAL_TABLET | Freq: Every day | ORAL | Status: DC

## 2012-03-03 MED ORDER — TRAMADOL HCL 50 MG PO TABS: 50.0000 mg | ORAL_TABLET | Freq: Three times a day (TID) | ORAL | Status: DC | PRN

## 2012-03-03 NOTE — Patient Instructions (Addendum)
Limit your sodium (Salt) intake   Please check your hemoglobin A1c every 3 months   

## 2012-03-03 NOTE — Progress Notes (Signed)
  Subjective:    Patient ID: Johnny [name], male    DOB: 1935/03/22, 77 y.o.   MRN: [id number]  HPI 77 year old patient who is seen today for followup. Problems include alcoholism coronary artery disease and hypertension. He has type 2 diabetes and was seen in the ED 1 week ago do to hypoglycemia. The patient continues to track home blood sugars. Fasting blood sugar today 126 and earlier this morning 126. His glipizide dose has been down titrated    Review of Systems  Constitutional: Negative for fever, chills, appetite change and fatigue.  HENT: Negative for hearing loss, ear pain, congestion, sore throat, trouble swallowing, neck stiffness, dental problem, voice change and tinnitus.   Eyes: Negative for pain, discharge and visual disturbance.  Respiratory: Negative for cough, chest tightness, wheezing and stridor.   Cardiovascular: Negative for chest pain, palpitations and leg swelling.  Gastrointestinal: Negative for nausea, vomiting, abdominal pain, diarrhea, constipation, blood in stool and abdominal distention.  Genitourinary: Negative for urgency, hematuria, flank pain, discharge, difficulty urinating and genital sores.  Musculoskeletal: Negative for myalgias, back pain, joint swelling, arthralgias and gait problem.  Skin: Negative for rash.  Neurological: Negative for dizziness, syncope, speech difficulty, weakness, numbness and headaches.  Hematological: Negative for adenopathy. Does not bruise/bleed easily.  Psychiatric/Behavioral: Negative for behavioral problems and dysphoric mood. The patient is not nervous/anxious.        Objective:   Physical Exam  Constitutional: He is oriented to person, place, and time. He appears well-developed.  HENT:  Head: Normocephalic.  Right Ear: External ear normal.  Left Ear: External ear normal.  Eyes: Conjunctivae normal and EOM are normal.  Neck: Normal range of motion.  Cardiovascular: Normal rate and normal heart sounds.   Pulmonary/Chest:  Breath sounds normal.  Abdominal: Bowel sounds are normal.  Musculoskeletal: Normal range of motion. He exhibits no edema and no tenderness.  Neurological: He is alert and oriented to person, place, and time.  Psychiatric: He has a normal mood and affect. His behavior is normal.          Assessment & Plan:    Diabetes mellitus. Will decrease glipizide to 5 mg daily Hypertension stable Coronary artery disease stable  Recheck 3 months

## 2012-04-01 ENCOUNTER — Inpatient Hospital Stay (HOSPITAL_COMMUNITY): Payer: Medicare PPO

## 2012-04-01 ENCOUNTER — Emergency Department (HOSPITAL_COMMUNITY): Payer: Medicare PPO

## 2012-04-01 ENCOUNTER — Other Ambulatory Visit: Payer: Self-pay | Admitting: Family Medicine

## 2012-04-01 ENCOUNTER — Encounter (HOSPITAL_COMMUNITY): Payer: Self-pay

## 2012-04-01 ENCOUNTER — Inpatient Hospital Stay (HOSPITAL_COMMUNITY)
Admission: EM | Admit: 2012-04-01 | Discharge: 2012-05-07 | DRG: 004 | Disposition: A | Discharge status: Other Acute Inpatient Hospital | Payer: Medicare PPO | Attending: Pulmonary Disease | Admitting: Pulmonary Disease

## 2012-04-01 DIAGNOSIS — D6959 Other secondary thrombocytopenia: Secondary | ICD-10-CM | POA: Diagnosis present

## 2012-04-01 DIAGNOSIS — R443 Hallucinations, unspecified: Secondary | ICD-10-CM

## 2012-04-01 DIAGNOSIS — R32 Unspecified urinary incontinence: Secondary | ICD-10-CM | POA: Diagnosis present

## 2012-04-01 DIAGNOSIS — J9600 Acute respiratory failure, unspecified whether with hypoxia or hypercapnia: Secondary | ICD-10-CM | POA: Diagnosis not present

## 2012-04-01 DIAGNOSIS — E871 Hypo-osmolality and hyponatremia: Secondary | ICD-10-CM | POA: Diagnosis present

## 2012-04-01 DIAGNOSIS — I4891 Unspecified atrial fibrillation: Secondary | ICD-10-CM

## 2012-04-01 DIAGNOSIS — G931 Anoxic brain damage, not elsewhere classified: Secondary | ICD-10-CM | POA: Diagnosis not present

## 2012-04-01 DIAGNOSIS — N390 Urinary tract infection, site not specified: Secondary | ICD-10-CM

## 2012-04-01 DIAGNOSIS — I2510 Atherosclerotic heart disease of native coronary artery without angina pectoris: Secondary | ICD-10-CM | POA: Diagnosis present

## 2012-04-01 DIAGNOSIS — E43 Unspecified severe protein-calorie malnutrition: Secondary | ICD-10-CM | POA: Diagnosis present

## 2012-04-01 DIAGNOSIS — E785 Hyperlipidemia, unspecified: Secondary | ICD-10-CM | POA: Diagnosis present

## 2012-04-01 DIAGNOSIS — R6520 Severe sepsis without septic shock: Secondary | ICD-10-CM | POA: Diagnosis not present

## 2012-04-01 DIAGNOSIS — E8770 Fluid overload, unspecified: Secondary | ICD-10-CM

## 2012-04-01 DIAGNOSIS — Z951 Presence of aortocoronary bypass graft: Secondary | ICD-10-CM

## 2012-04-01 DIAGNOSIS — I160 Hypertensive urgency: Secondary | ICD-10-CM

## 2012-04-01 DIAGNOSIS — Z9911 Dependence on respirator [ventilator] status: Secondary | ICD-10-CM

## 2012-04-01 DIAGNOSIS — J4489 Other specified chronic obstructive pulmonary disease: Secondary | ICD-10-CM | POA: Diagnosis present

## 2012-04-01 DIAGNOSIS — Z79899 Other long term (current) drug therapy: Secondary | ICD-10-CM

## 2012-04-01 DIAGNOSIS — N179 Acute kidney failure, unspecified: Secondary | ICD-10-CM | POA: Diagnosis present

## 2012-04-01 DIAGNOSIS — G9340 Encephalopathy, unspecified: Secondary | ICD-10-CM | POA: Diagnosis present

## 2012-04-01 DIAGNOSIS — R197 Diarrhea, unspecified: Secondary | ICD-10-CM

## 2012-04-01 DIAGNOSIS — F10939 Alcohol use, unspecified with withdrawal, unspecified: Secondary | ICD-10-CM | POA: Diagnosis present

## 2012-04-01 DIAGNOSIS — M545 Low back pain, unspecified: Secondary | ICD-10-CM | POA: Diagnosis present

## 2012-04-01 DIAGNOSIS — E1151 Type 2 diabetes mellitus with diabetic peripheral angiopathy without gangrene: Secondary | ICD-10-CM | POA: Diagnosis present

## 2012-04-01 DIAGNOSIS — R531 Weakness: Secondary | ICD-10-CM

## 2012-04-01 DIAGNOSIS — E119 Type 2 diabetes mellitus without complications: Secondary | ICD-10-CM

## 2012-04-01 DIAGNOSIS — E878 Other disorders of electrolyte and fluid balance, not elsewhere classified: Secondary | ICD-10-CM

## 2012-04-01 DIAGNOSIS — R569 Unspecified convulsions: Secondary | ICD-10-CM

## 2012-04-01 DIAGNOSIS — E873 Alkalosis: Secondary | ICD-10-CM | POA: Diagnosis not present

## 2012-04-01 DIAGNOSIS — I4892 Unspecified atrial flutter: Secondary | ICD-10-CM | POA: Diagnosis not present

## 2012-04-01 DIAGNOSIS — A419 Sepsis, unspecified organism: Secondary | ICD-10-CM | POA: Diagnosis present

## 2012-04-01 DIAGNOSIS — R6510 Systemic inflammatory response syndrome (SIRS) of non-infectious origin without acute organ dysfunction: Secondary | ICD-10-CM

## 2012-04-01 DIAGNOSIS — F10239 Alcohol dependence with withdrawal, unspecified: Secondary | ICD-10-CM | POA: Diagnosis present

## 2012-04-01 DIAGNOSIS — I10 Essential (primary) hypertension: Secondary | ICD-10-CM | POA: Diagnosis present

## 2012-04-01 DIAGNOSIS — R6521 Severe sepsis with septic shock: Secondary | ICD-10-CM | POA: Diagnosis present

## 2012-04-01 DIAGNOSIS — J449 Chronic obstructive pulmonary disease, unspecified: Secondary | ICD-10-CM

## 2012-04-01 DIAGNOSIS — K089 Disorder of teeth and supporting structures, unspecified: Secondary | ICD-10-CM

## 2012-04-01 DIAGNOSIS — G8929 Other chronic pain: Secondary | ICD-10-CM | POA: Diagnosis present

## 2012-04-01 DIAGNOSIS — K7010 Alcoholic hepatitis without ascites: Secondary | ICD-10-CM | POA: Diagnosis present

## 2012-04-01 DIAGNOSIS — J9601 Acute respiratory failure with hypoxia: Secondary | ICD-10-CM | POA: Diagnosis present

## 2012-04-01 DIAGNOSIS — R000 Tachycardia, unspecified: Secondary | ICD-10-CM

## 2012-04-01 DIAGNOSIS — I469 Cardiac arrest, cause unspecified: Secondary | ICD-10-CM | POA: Diagnosis present

## 2012-04-01 DIAGNOSIS — E1169 Type 2 diabetes mellitus with other specified complication: Secondary | ICD-10-CM | POA: Diagnosis not present

## 2012-04-01 DIAGNOSIS — Z87891 Personal history of nicotine dependence: Secondary | ICD-10-CM

## 2012-04-01 DIAGNOSIS — E869 Volume depletion, unspecified: Secondary | ICD-10-CM | POA: Diagnosis present

## 2012-04-01 DIAGNOSIS — K700 Alcoholic fatty liver: Secondary | ICD-10-CM | POA: Diagnosis present

## 2012-04-01 DIAGNOSIS — F1020 Alcohol dependence, uncomplicated: Secondary | ICD-10-CM | POA: Diagnosis present

## 2012-04-01 DIAGNOSIS — E876 Hypokalemia: Secondary | ICD-10-CM | POA: Diagnosis not present

## 2012-04-01 DIAGNOSIS — R1310 Dysphagia, unspecified: Secondary | ICD-10-CM | POA: Diagnosis present

## 2012-04-01 DIAGNOSIS — E46 Unspecified protein-calorie malnutrition: Secondary | ICD-10-CM

## 2012-04-01 DIAGNOSIS — D638 Anemia in other chronic diseases classified elsewhere: Secondary | ICD-10-CM | POA: Diagnosis present

## 2012-04-01 DIAGNOSIS — Z930 Tracheostomy status: Secondary | ICD-10-CM

## 2012-04-01 DIAGNOSIS — Z8673 Personal history of transient ischemic attack (TIA), and cerebral infarction without residual deficits: Secondary | ICD-10-CM

## 2012-04-01 DIAGNOSIS — E870 Hyperosmolality and hypernatremia: Secondary | ICD-10-CM | POA: Diagnosis not present

## 2012-04-01 DIAGNOSIS — E860 Dehydration: Secondary | ICD-10-CM

## 2012-04-01 DIAGNOSIS — J189 Pneumonia, unspecified organism: Principal | ICD-10-CM | POA: Diagnosis present

## 2012-04-01 DIAGNOSIS — R4182 Altered mental status, unspecified: Secondary | ICD-10-CM

## 2012-04-01 DIAGNOSIS — R159 Full incontinence of feces: Secondary | ICD-10-CM | POA: Diagnosis present

## 2012-04-01 DIAGNOSIS — R29898 Other symptoms and signs involving the musculoskeletal system: Secondary | ICD-10-CM

## 2012-04-01 HISTORY — DX: Unspecified convulsions: R56.9

## 2012-04-01 HISTORY — DX: Hallucinations, unspecified: R44.3

## 2012-04-01 HISTORY — DX: Altered mental status, unspecified: R41.82

## 2012-04-01 HISTORY — DX: Unspecified osteoarthritis, unspecified site: M19.90

## 2012-04-01 LAB — URINALYSIS, ROUTINE W REFLEX MICROSCOPIC
Ketones, ur: 15 mg/dL — AB
Nitrite: POSITIVE — AB
Specific Gravity, Urine: 1.026 (ref 1.005–1.030)
pH: 5 (ref 5.0–8.0)

## 2012-04-01 LAB — GLUCOSE, CAPILLARY
Glucose-Capillary: 180 mg/dL — ABNORMAL HIGH (ref 70–99)
Glucose-Capillary: 154 mg/dL — ABNORMAL HIGH (ref 70–99)

## 2012-04-01 LAB — CBC WITH DIFFERENTIAL/PLATELET
Basophils Absolute: 0 10*3/uL (ref 0.0–0.1)
Eosinophils Absolute: 0 10*3/uL (ref 0.0–0.7)
Lymphocytes Relative: 20 % (ref 12–46)
MCH: 33 pg (ref 26.0–34.0)
MCHC: 25.3 g/dL — ABNORMAL LOW (ref 30.0–36.0)
Monocytes Absolute: 0.8 10*3/uL (ref 0.1–1.0)
Neutrophils Relative %: 70 % (ref 43–77)
Platelets: 89 10*3/uL — ABNORMAL LOW (ref 150–400)
WBC Morphology: INCREASED

## 2012-04-01 LAB — COMPREHENSIVE METABOLIC PANEL
Albumin: 3.4 g/dL — ABNORMAL LOW (ref 3.5–5.2)
BUN: 45 mg/dL — ABNORMAL HIGH (ref 6–23)
Calcium: 10.2 mg/dL (ref 8.4–10.5)
Creatinine, Ser: 1.98 mg/dL — ABNORMAL HIGH (ref 0.50–1.35)
Total Bilirubin: 1.6 mg/dL — ABNORMAL HIGH (ref 0.3–1.2)
Total Protein: 8.1 g/dL (ref 6.0–8.3)

## 2012-04-01 LAB — CBC
Hemoglobin: 11.6 g/dL — ABNORMAL LOW (ref 13.0–17.0)
MCH: 33 pg (ref 26.0–34.0)
MCV: 94.3 fL (ref 78.0–100.0)
RBC: 3.52 MIL/uL — ABNORMAL LOW (ref 4.22–5.81)

## 2012-04-01 LAB — URINE MICROSCOPIC-ADD ON

## 2012-04-01 LAB — RAPID URINE DRUG SCREEN, HOSP PERFORMED
Amphetamines: NOT DETECTED
Cocaine: NOT DETECTED
Opiates: NOT DETECTED
Tetrahydrocannabinol: NOT DETECTED

## 2012-04-01 LAB — AMMONIA: Ammonia: 19 umol/L (ref 11–60)

## 2012-04-01 MED ORDER — LORAZEPAM 1 MG PO TABS: 0.0000 mg | ORAL_TABLET | Freq: Two times a day (BID) | ORAL | Status: DC

## 2012-04-01 MED ORDER — DEXTROSE 5 % IV SOLN: 2.0000 g | INTRAVENOUS | Status: DC

## 2012-04-01 MED ORDER — DEXTROSE 5 % IV SOLN
2.0000 g | INTRAVENOUS | Status: DC
  Administered 2012-04-02 – 2012-04-03 (×3): 2 g via INTRAVENOUS
  Filled 2012-04-01 (×5): qty 2

## 2012-04-01 MED ORDER — FENTANYL CITRATE 0.05 MG/ML IJ SOLN
25.0000 ug | INTRAMUSCULAR | Status: DC | PRN
  Administered 2012-04-02: 50 ug via INTRAVENOUS
  Filled 2012-04-01 (×2): qty 2

## 2012-04-01 MED ORDER — CHLORHEXIDINE GLUCONATE 0.12 % MT SOLN
15.0000 mL | Freq: Two times a day (BID) | OROMUCOSAL | Status: DC
  Administered 2012-04-02 – 2012-05-07 (×71): 15 mL via OROMUCOSAL
  Filled 2012-04-01 (×72): qty 15

## 2012-04-01 MED ORDER — LEVOFLOXACIN IN D5W 750 MG/150ML IV SOLN: 750.0000 mg | INTRAVENOUS | Status: DC

## 2012-04-01 MED ORDER — ADULT MULTIVITAMIN W/MINERALS CH
1.0000 | ORAL_TABLET | Freq: Every day | ORAL | Status: DC
  Administered 2012-04-01 – 2012-04-05 (×4): 1 via ORAL
  Filled 2012-04-01 (×5): qty 1

## 2012-04-01 MED ORDER — INSULIN ASPART 100 UNIT/ML ~~LOC~~ SOLN: 0.0000 [IU] | SUBCUTANEOUS | Status: DC

## 2012-04-01 MED ORDER — MIDAZOLAM HCL 2 MG/2ML IJ SOLN
1.0000 mg | INTRAMUSCULAR | Status: DC | PRN
  Administered 2012-04-02: 2 mg via INTRAVENOUS
  Filled 2012-04-01 (×2): qty 2

## 2012-04-01 MED ORDER — LORAZEPAM 2 MG/ML IJ SOLN: 1.0000 mg | Freq: Four times a day (QID) | INTRAMUSCULAR | Status: DC | PRN

## 2012-04-01 MED ORDER — ISOSORBIDE MONONITRATE ER 30 MG PO TB24
30.0000 mg | ORAL_TABLET | Freq: Every day | ORAL | Status: DC
  Administered 2012-04-01: 30 mg via ORAL
  Filled 2012-04-01: qty 1

## 2012-04-01 MED ORDER — VANCOMYCIN HCL IN DEXTROSE 1-5 GM/200ML-% IV SOLN
1000.0000 mg | Freq: Once | INTRAVENOUS | Status: AC
  Administered 2012-04-01: 1000 mg via INTRAVENOUS
  Filled 2012-04-01: qty 200

## 2012-04-01 MED ORDER — DEXTROSE 5 % IV SOLN: 1.0000 g | Freq: Three times a day (TID) | INTRAVENOUS | Status: DC

## 2012-04-01 MED ORDER — INSULIN ASPART 100 UNIT/ML ~~LOC~~ SOLN: 0.0000 [IU] | Freq: Three times a day (TID) | SUBCUTANEOUS | Status: DC

## 2012-04-01 MED ORDER — DEXTROSE 5 % IV SOLN
1.0000 g | Freq: Three times a day (TID) | INTRAVENOUS | Status: DC
  Filled 2012-04-01 (×2): qty 1

## 2012-04-01 MED ORDER — ACETAMINOPHEN 325 MG PO TABS: 650.0000 mg | ORAL_TABLET | Freq: Four times a day (QID) | ORAL | Status: DC | PRN

## 2012-04-01 MED ORDER — BISACODYL 5 MG PO TBEC: 5.0000 mg | DELAYED_RELEASE_TABLET | Freq: Every day | ORAL | Status: DC | PRN

## 2012-04-01 MED ORDER — ONDANSETRON HCL 4 MG/2ML IJ SOLN
4.0000 mg | INTRAMUSCULAR | Status: DC | PRN
  Administered 2012-04-06 – 2012-04-28 (×2): 4 mg via INTRAVENOUS
  Filled 2012-04-01 (×3): qty 2

## 2012-04-01 MED ORDER — FOLIC ACID 1 MG PO TABS
1.0000 mg | ORAL_TABLET | Freq: Every day | ORAL | Status: DC
  Administered 2012-04-01 – 2012-04-27 (×24): 1 mg via ORAL
  Filled 2012-04-01 (×27): qty 1

## 2012-04-01 MED ORDER — LABETALOL HCL 300 MG PO TABS
300.0000 mg | ORAL_TABLET | Freq: Two times a day (BID) | ORAL | Status: DC
  Administered 2012-04-01: 300 mg via ORAL
  Filled 2012-04-01: qty 1

## 2012-04-01 MED ORDER — SODIUM CHLORIDE 0.9 % IV BOLUS (SEPSIS)
250.0000 mL | Freq: Once | INTRAVENOUS | Status: AC
  Administered 2012-04-01: 250 mL via INTRAVENOUS

## 2012-04-01 MED ORDER — LEVOFLOXACIN IN D5W 750 MG/150ML IV SOLN
750.0000 mg | INTRAVENOUS | Status: DC
  Administered 2012-04-01: 750 mg via INTRAVENOUS
  Filled 2012-04-01: qty 150

## 2012-04-01 MED ORDER — VITAMIN B-1 100 MG PO TABS
100.0000 mg | ORAL_TABLET | Freq: Every day | ORAL | Status: DC
  Administered 2012-04-01 – 2012-04-27 (×24): 100 mg via ORAL
  Filled 2012-04-01 (×27): qty 1

## 2012-04-01 MED ORDER — INSULIN ASPART 100 UNIT/ML ~~LOC~~ SOLN
0.0000 [IU] | Freq: Every day | SUBCUTANEOUS | Status: DC
  Administered 2012-04-01: 2 [IU] via SUBCUTANEOUS

## 2012-04-01 MED ORDER — LORAZEPAM 1 MG PO TABS: 1.0000 mg | ORAL_TABLET | Freq: Four times a day (QID) | ORAL | Status: DC | PRN

## 2012-04-01 MED ORDER — VANCOMYCIN HCL IN DEXTROSE 1-5 GM/200ML-% IV SOLN
1000.0000 mg | INTRAVENOUS | Status: DC
  Administered 2012-04-02: 1000 mg via INTRAVENOUS
  Filled 2012-04-01 (×2): qty 200

## 2012-04-01 MED ORDER — BIOTENE DRY MOUTH MT LIQD
15.0000 mL | Freq: Four times a day (QID) | OROMUCOSAL | Status: DC
  Administered 2012-04-02 – 2012-05-07 (×142): 15 mL via OROMUCOSAL

## 2012-04-01 MED ORDER — TRAMADOL HCL 50 MG PO TABS
50.0000 mg | ORAL_TABLET | Freq: Four times a day (QID) | ORAL | Status: DC | PRN
  Filled 2012-04-01 (×2): qty 1

## 2012-04-01 MED ORDER — LEVOFLOXACIN IN D5W 750 MG/150ML IV SOLN
750.0000 mg | INTRAVENOUS | Status: DC
  Filled 2012-04-01: qty 150

## 2012-04-01 MED ORDER — SODIUM CHLORIDE 0.9 % IV BOLUS (SEPSIS)
500.0000 mL | Freq: Once | INTRAVENOUS | Status: AC
  Administered 2012-04-01: 500 mL via INTRAVENOUS

## 2012-04-01 MED ORDER — PANTOPRAZOLE SODIUM 40 MG IV SOLR
40.0000 mg | Freq: Every day | INTRAVENOUS | Status: DC
  Filled 2012-04-01: qty 40

## 2012-04-01 MED ORDER — ENOXAPARIN SODIUM 40 MG/0.4ML ~~LOC~~ SOLN
40.0000 mg | SUBCUTANEOUS | Status: DC
  Administered 2012-04-01 – 2012-04-07 (×7): 40 mg via SUBCUTANEOUS
  Filled 2012-04-01 (×8): qty 0.4

## 2012-04-01 MED ORDER — MAGNESIUM OXIDE 400 (241.3 MG) MG PO TABS
400.0000 mg | ORAL_TABLET | Freq: Every day | ORAL | Status: DC
  Administered 2012-04-01 – 2012-04-03 (×2): 400 mg via ORAL
  Filled 2012-04-01 (×3): qty 1

## 2012-04-01 MED ORDER — LORAZEPAM 1 MG PO TABS
0.0000 mg | ORAL_TABLET | Freq: Four times a day (QID) | ORAL | Status: DC
  Filled 2012-04-01: qty 4

## 2012-04-01 MED ORDER — SODIUM CHLORIDE 0.9 % IV SOLN: INTRAVENOUS | Status: DC

## 2012-04-01 MED ORDER — VITAMIN B-1 100 MG PO TABS: 100.0000 mg | ORAL_TABLET | Freq: Every day | ORAL | Status: DC

## 2012-04-01 MED ORDER — SODIUM CHLORIDE 0.9 % IV SOLN
INTRAVENOUS | Status: DC
  Administered 2012-04-04: 18:00:00 via INTRAVENOUS
  Administered 2012-04-06: 20 mL/h via INTRAVENOUS
  Administered 2012-04-14: 02:00:00 via INTRAVENOUS
  Administered 2012-04-15: 1000 mL via INTRAVENOUS
  Administered 2012-04-20: 500 mL via INTRAVENOUS
  Administered 2012-04-23: 20 mL/h via INTRAVENOUS
  Administered 2012-04-26 – 2012-04-29 (×4): via INTRAVENOUS
  Administered 2012-05-01: 20 mL/h via INTRAVENOUS
  Administered 2012-05-04: 16:00:00 via INTRAVENOUS

## 2012-04-01 NOTE — ED Provider Notes (Signed)
Angiocath insertion Date/Time: 04/01/2012 7:48 PM Performed by: Derwood Kaplan Authorized by: Derwood Kaplan Consent: Verbal consent obtained. Consent given by: patient Patient understanding: patient states understanding of the procedure being performed Patient identity confirmed: verbally with patient Preparation: Patient was prepped and draped in the usual sterile fashion. Local anesthesia used: no Patient sedated: no Patient tolerance: Patient tolerated the procedure well with no immediate complications.  [place] bedside Date/Time: 04/01/2012 7:48 PM Performed by: Derwood Kaplan Authorized by: Derwood Kaplan Consent: Verbal consent obtained. Consent given by: patient Patient identity confirmed: verbally with patient Preparation: Patient was prepped and draped in the usual sterile fashion. Local anesthesia used: no Patient sedated: no Patient tolerance: Patient tolerated the procedure well with no immediate complications.  ULTRASOUND GUIDED IV ACCESS ESTABLISHED, RIGHT ANTECUBITAL.  Derwood Kaplan, MD 04/01/12 1949

## 2012-04-01 NOTE — ED Notes (Signed)
This RN unable to find IV site,

## 2012-04-01 NOTE — Progress Notes (Signed)
Pt arrived via stretcher to [id number].  Oriented to self and location, but having hallucinations.  Able to state name and birth date.  Tremors are noted in hands when at rest.  Denied any complaints of pain.  Scrotum red and inflamed.  Skin tear noted on right buttock, midline.  No family present at bedside upon arrival.  IV infiltrated in right forearm.  Called IV team to assess.  VSS.  Will continue to monitor at this time.

## 2012-04-01 NOTE — Progress Notes (Signed)
ANTIBIOTIC CONSULT NOTE - INITIAL  Pharmacy Consult for vanc/cefepime/levaquin Indication: rule out pneumonia  No Known Allergies  Patient Measurements: Height: 5\' 11"  (180.3 cm) Weight: 174 lb (78.926 kg) IBW/kg (Calculated) : 75.3   Vital Signs: Temp: 99 F (37.2 C) (12/12 1406) Temp src: Rectal (12/12 1406) BP: 161/45 mmHg (12/12 1548) Pulse Rate: 43  (12/12 1500) Intake/Output from previous day:   Intake/Output from this shift:    Labs:  Basename 04/01/12 1400  WBC 8.4  HGB 11.9*  PLT 89*  LABCREA --  CREATININE 1.98*   Estimated Creatinine Clearance: 34.3 ml/min (by C-G formula based on Cr of 1.98). No results found for this basename: VANCOTROUGH:2,VANCOPEAK:2,VANCORANDOM:2,GENTTROUGH:2,GENTPEAK:2,GENTRANDOM:2,TOBRATROUGH:2,TOBRAPEAK:2,TOBRARND:2,AMIKACINPEAK:2,AMIKACINTROU:2,AMIKACIN:2, in the last 72 hours   Microbiology: No results found for this or any previous visit (from the past 720 hour(s)).  Medical History: Past Medical History  Diagnosis Date  . Acute alcoholic hepatitis 05/20/2010  . Anemia of other chronic disease 08/16/2007  . Atrial fibrillation 08/12/2007  . CORONARY ARTERY DISEASE 12/11/2006  . DIABETES MELLITUS, TYPE II 12/11/2006  . HYPERTENSION 12/11/2006  . Hyperlipidemia   . ETOH abuse   . Chronic low back pain     Medications:  Scheduled:    . vancomycin  1,000 mg Intravenous Once   Assessment: 77 yo who was admitted for PNA. He was recently admitted back in September for alcohol withdrawal issues. He is now being admitted for seizures like activity. CXR shows right lower lobe infiltrate so vanc/cefepime/levaquin have been ordered empirically.  Goal of Therapy:  Vancomycin trough level 15-20 mcg/ml  Plan:   Vanc 1g IV q24 Cefepime 2g IV q24 Levaquin 750mg  IV q48  Ulyses [place]ward [place] 04/01/2012,3:50 PM

## 2012-04-01 NOTE — ED Notes (Signed)
Attempted to collect urine, pt could not void 

## 2012-04-01 NOTE — ED Notes (Signed)
Ordered Clear liquid diet per Dr. Suanne Marker

## 2012-04-01 NOTE — Progress Notes (Signed)
Sputum Culture cup left in patients room. Pt unable to produce sputum at this time. RT encouraged pt to let  Nurse know if he was able to produce anything. RN notified.

## 2012-04-01 NOTE — ED Notes (Signed)
Spoke with IV team re: PICC line insertion, awaiting to hear from RN who will place PICC

## 2012-04-01 NOTE — ED Notes (Signed)
IV team notified.

## 2012-04-01 NOTE — Progress Notes (Signed)
Contacted Lilia Pro (daughter) informed that patient is being resuscitated.  Daughter stated "I'll call my momma and sister and we will be there."

## 2012-04-01 NOTE — Progress Notes (Signed)
eLink Physician-Brief Progress Note Patient Name: Johnny [name] DOB: 05-26-1934 MRN: [id number]  Date of Service  04/01/2012   HPI/Events of Note  Contacted by medical teaching service resident for management of patient s/p PEA arrest.  Patient is currently intubated with BP of 101/40  (40).   eICU Interventions  Plan: Dr. Henrene Pastor to see patient at bedside Vent orders Neo gtt for BP support Arterial line to be placed for HD monitoring Gycemic control PRN sedation for now until LOC   Intervention Category Major Interventions: Respiratory failure - evaluation and management;Other: (s/p PEA arrest)  DETERDING,ELIZABETH 04/01/2012, 11:58 PM

## 2012-04-01 NOTE — Discharge Summary (Addendum)
[facility] History and Physical  Johnny [name]:[id number] DOB: 1934-06-23 DOA: 04/01/2012  Referring physician: Dr Clarene Duke PCP: Rogelia Boga, MD  Specialists: none  Chief Complaint: Per the EMS found per roommates shaking/?seizure-like activity, incontinent  HPI: Johnny [name] is a 77 y.o. male with history of alcohol abuse, alcoholic hepatitis, CAD, status post CABG in the past, atrial fibrillation diabetes mellitus hypertension chronic low back pain who presents with above complaints-he is not a good historian. Per EMS patient's roommate/niece  found him on the couch "shaking"-unknown duration, was also incontinent-of urine and stool. Nursing staff report that patient was covered with layers of feces when he arrived in the ED. His room mate states that patient was awake- no loss of consciousness, patient is not [name] to tell or exactly aware of what happened. She does state that for the past 3days all he has had is alcohol, stating he has not had anything to eat or and has not taken any medications. He reports he drinks at least 3 times a day -any kind of alcohol he can find, and at least 3 days of the week. Reports his last drink was yesterday. He admits to a nonproductive cough x2-3 days,  to nausea vomitingx1 yesterday. He was seen in the ED and a chest x-ray was done which showed parenchymal opacity in the right lower lobe and possibly right middle lobe most consistent with pneumonia question small right pleural effusion. EKG revealed a sinus tachycardia with right bundle branch block. A CT scan of his head was done and showed chronic changes, no acute findings. His LFTs were noted to be elevated. He is admitted for further evaluation and management. Review of Systems: The patient denies anorexia, fever, weight loss,, vision loss, decreased hearing, hoarseness, chest pain, syncope, dyspnea on exertion, peripheral edema, balance deficits, hemoptysis, abdominal pain, melena,  hematochezia, severe indigestion/heartburn, hematuria suspicious skin lesions, transient blindness, difficulty walking, depression, unusual weight change.  Past Medical History  Diagnosis Date  . Acute alcoholic hepatitis 05/20/2010  . Anemia of other chronic disease 08/16/2007  . Atrial fibrillation 08/12/2007  . CORONARY ARTERY DISEASE 12/11/2006  . DIABETES MELLITUS, TYPE II 12/11/2006  . HYPERTENSION 12/11/2006  . Hyperlipidemia   . ETOH abuse   . Chronic low back pain    Past Surgical History  Procedure Date  . Coronary artery bypass graft    Social History:  reports that he quit smoking about 28 years ago. He does not have any smokeless tobacco history on file. He reports that he drinks alcohol. He reports that he does not use illicit drugs. where does patient live- at home, his niece lives with him   No Known Allergies  Family History  Problem Relation Age of Onset  . Heart failure Mother   . Aneurysm Sister   . Suicidality Brother   . Heart failure Brother   . Diabetes Sister    Prior to Admission medications   Medication Sig Start Date End Date Taking? Authorizing Provider  acetaminophen (TYLENOL) 500 MG tablet Take 1,000 mg by mouth every 6 (six) hours as needed. For pain   Yes Historical Provider, MD  clonazePAM (KLONOPIN) 0.5 MG tablet Take 1 tablet (0.5 mg total) by mouth 2 (two) times daily. 01/05/12  Yes Vassie Loll, MD  folic acid (FOLVITE) 1 MG tablet Take 1 tablet (1 mg total) by mouth daily. 01/05/12  Yes Vassie Loll, MD  glipiZIDE (GLUCOTROL) 5 MG tablet Take 5 mg by mouth 2 (two) times  daily before a meal.   Yes Historical Provider, MD  isosorbide mononitrate (IMDUR) 30 MG 24 hr tablet Take 1 tablet (30 mg total) by mouth daily. 01/05/12  Yes Vassie Loll, MD  labetalol (NORMODYNE) 300 MG tablet Take 1 tablet (300 mg total) by mouth 2 (two) times daily. 01/16/12  Yes Gordy Savers, MD  magnesium oxide (MAG-OX) 400 (241.3 MG) MG tablet Take 1 tablet (400  mg total) by mouth daily. 01/05/12  Yes Vassie Loll, MD  thiamine 100 MG tablet Take 1 tablet (100 mg total) by mouth daily. 01/05/12  Yes Vassie Loll, MD   Physical Exam: Filed Vitals:   04/01/12 1548 04/01/12 1615 04/01/12 1630 04/01/12 1645  BP: 161/45 149/74 109/58 138/103  Pulse:  118 116   Temp:      TempSrc:      Resp: 32 25 20 18   Height: 5\' 11"  (1.803 m)     Weight: 78.926 kg (174 lb)     SpO2: 100% 98% 100%     Constitutional: Vital signs reviewed.  Patient is a disheveled, well-developed  in no acute distress and cooperative with exam. Alert and oriented x3.  Head: Normocephalic and atraumatic Mouth: no erythema or exudates, dry MM Eyes: PERRL, EOMI, conjunctivae normal, No scleral icterus.  Neck: Supple, Trachea midline normal ROM, No JVD, mass, thyromegaly, or carotid bruit present.  Cardiovascular: Tachycardic, regular, S1 normal, S2 normal, no MRG, pulses symmetric and intact bilaterally Pulmonary/Chest: Rhonchi bilaterally, greater on right, no wheezes. Abdominal: Soft. Non-tender, non-distended, bowel sounds are normal, no masses, organomegaly, or guarding present.  GU: Scrotum denuded/excoriated   extremities: No cyanosis and no edema  Neurological: A&O x3, moves all extremities grossly, nonfocal sensory intact to light touch bilaterally.   Psychiatric: Normal mood and affect.   Labs on Admission:  Basic Metabolic Panel:  Lab 04/01/12 1610  NA 131*  K 4.5  CL 89*  CO2 17*  GLUCOSE 184*  BUN 45*  CREATININE 1.98*  CALCIUM 10.2  MG --  PHOS --   Liver Function Tests:  Lab 04/01/12 1400  AST 117*  ALT 31  ALKPHOS 138*  BILITOT 1.6*  PROT 8.1  ALBUMIN 3.4*   No results found for this basename: LIPASE:5,AMYLASE:5 in the last 168 hours  Lab 04/01/12 1401  AMMONIA 19   CBC:  Lab 04/01/12 1400  WBC 8.4  NEUTROABS 5.9  HGB 11.9*  HCT 47.1  MCV 130.5*  PLT 89*   Cardiac Enzymes: No results found for this basename:  CKTOTAL:5,CKMB:5,CKMBINDEX:5,TROPONINI:5 in the last 168 hours  BNP (last 3 results) No results found for this basename: PROBNP:3 in the last 8760 hours CBG:  Lab 04/01/12 1128  GLUCAP 180*    Radiological Exams on Admission: Dg Chest 2 View  04/01/2012  *RADIOLOGY REPORT*  Clinical Data: Seizure, altered mental status, cough and congestion  CHEST - 2 VIEW  Comparison: Chest x-ray of 04/28/2011 and CT angio chest of 04/30/2011  Findings: There is parenchymal opacity in the right mid and lower lung field most consistent with right lower lobe and possibly right middle lobe pneumonia.  There may be a small right pleural effusion present.  The left lung appears clear.  Mediastinal contours appear normal.  The heart is within normal limits in size.  No bony abnormality is seen other than degenerative change throughout the thoracic spine.  IMPRESSION: Parenchymal opacity in the right lower lobe and possibly right middle lobe most consistent with pneumonia.  Recommend follow-up to ensure  clearing.  Question of small right pleural effusion   Original Report Authenticated By: Dwyane Dee, M.D.    Ct Head Wo Contrast  04/01/2012  *RADIOLOGY REPORT*  Clinical Data: The patient was found having a seizure.  No known injury. History of diabetes, hypertension, and alcohol abuse.  CT HEAD WITHOUT CONTRAST  Technique:  Contiguous axial images were obtained from the base of the skull through the vertex without contrast.  Comparison: Prior CT head 07/01/2011.  Findings: There is no evidence for acute infarction, intracranial hemorrhage, mass lesion, hydrocephalus, or extra-axial fluid. Moderate atrophy is present.  Chronic microvascular ischemic change affects the periventricular subcortical white matter.  The calvarium is intact.  Sinuses and mastoids are clear.  Moderate vascular calcification is noted.  There is no acute sinus or mastoid disease.  The appearance is similar to priors.  IMPRESSION: Chronic changes as  described.  No visible acute stroke, hemorrhage, or intracranial mass lesion.   Original Report Authenticated By: Davonna Belling, M.D.     EKG: Independently reviewed. Sinus at 117, RBB  Assessment/Plan Principal Problem:  *HCAP (healthcare-associated pneumonia) versus aspiration -As discussed above, he reports vomiting and has been drinking-plus possibility of aspirating, and was last discharged from the hospital September 16 (which is in the 90 day window for HCAP) -Empiric antibiotics with cefepime Vanco and Levaquin -ST for Swallow eval Active Problems:  Alcohol dependence/probable early withdrawal with ?alcohol related seizures -As discussed above his last drink was yesterday, he is tachycardic has a tremor on exam, mostly elevated BPs (although initially noted to be hypotensive) -Place on Ativan detox protocol -Obtain an EEG, alum and further treat accordingly pending evolution of clinical course. -SW consult for this abuse (also per ED nurses his living conditions were filthy,?placement) DIABETES MELLITUS, TYPE II -Monitor Accu-Cheks, sliding scale follow resume oral hypoglycemics when appropriate.  HYPERTENSION -Monitor blood pressures, hold parameters for meds, monitor and treat as appropriate.  ARF (acute renal failure) -Likely prerenal, hydrate follow and recheck  Volume depletion -As above hydrate follow and recheck  Hyponatremia -Likely secondary to volume depletion, hydrate follow and recheck. Elevated LFTs -He has a history of alcoholic hepatitis-likely etiology. We'll also obtain acute hepatitis panel follow. History of atrial fibrillation -Currently sinus tach, was not on any aspirin and would not be a good Coumadin candidate given his history. only labetalol listed on his home meds which is likely for his hypertension.    Code Status: full Family Communication: directly with pt at bedside, no family available Disposition Plan: admit to Tele  Time spent:  >55mins  Kela Millin [facility] Pager [phone]  If 7PM-7AM, please contact night-coverage [email or web address] Password [facility] 04/01/2012, 5:35 PM

## 2012-04-01 NOTE — ED Notes (Signed)
Heart Healthy Diet ordered spoke with ANA

## 2012-04-01 NOTE — ED Provider Notes (Signed)
History     CSN: [id number]  Arrival date & time 04/01/12  1040   First MD Initiated Contact with Patient 04/01/12 1104      Chief Complaint  Patient presents with  . Seizures     HPI Pt was seen at 1115.   Per pt, pt's roommate and EMS report, pt with sudden onset and resolution of one episode of "shaking all over" that occurred while he was laying on the couch at home PTA.  Pt's roommate witnessed the episode, he states the patient was "just talking out of his head" and "shaking."  States pt did not have LOC.  Pt states he "thinks" he remembers what occurred PTA.  Pt states he does not drink etoh daily and his LD etoh was this morning.  Pt's only c/o is that he has been "coughing a lot" for the past 2 days.  Denies CP/SOB, no abd pain, no N/V/D, no back pain, no fevers.    Past Medical History  Diagnosis Date  . Acute alcoholic hepatitis 05/20/2010  . Anemia of other chronic disease 08/16/2007  . Atrial fibrillation 08/12/2007  . CORONARY ARTERY DISEASE 12/11/2006  . DIABETES MELLITUS, TYPE II 12/11/2006  . HYPERTENSION 12/11/2006  . Hyperlipidemia   . ETOH abuse   . Chronic low back pain     Past Surgical History  Procedure Date  . Coronary artery bypass graft     Family History  Problem Relation Age of Onset  . Heart failure Mother   . Aneurysm Sister   . Suicidality Brother   . Heart failure Brother   . Diabetes Sister     History  Substance Use Topics  . Smoking status: Former Smoker    Quit date: 04/22/1983  . Smokeless tobacco: Not on file  . Alcohol Use: Yes     Comment: hx of ETOH    Review of Systems ROS: Statement: All systems negative except as marked or noted in the HPI; Constitutional: Negative for fever and chills. ; ; Eyes: Negative for eye pain, redness and discharge. ; ; ENMT: Negative for ear pain, hoarseness, nasal congestion, sinus pressure and sore throat. ; ; Cardiovascular: Negative for chest pain, palpitations, diaphoresis, dyspnea and  peripheral edema. ; ; Respiratory: +cough. Negative for wheezing and stridor. ; ; Gastrointestinal: Negative for nausea, vomiting, diarrhea, abdominal pain, blood in stool, hematemesis, jaundice and rectal bleeding. . ; ; Genitourinary: Negative for dysuria, flank pain and hematuria. ; ; Musculoskeletal: Negative for back pain and neck pain. Negative for swelling and trauma.; ; Skin: Negative for pruritus, rash, abrasions, blisters, bruising and skin lesion.; ; Neuro: +"shook all over," AMS.  Negative for headache, lightheadedness and neck stiffness. Negative for weakness, altered level of consciousness , extremity weakness, paresthesias, involuntary movement, syncope.       Allergies  Review of patient's allergies indicates no known allergies.  Home Medications   Current Outpatient Rx  Name  Route  Sig  Dispense  Refill  . ACETAMINOPHEN 500 MG PO TABS   Oral   Take 1,000 mg by mouth every 6 (six) hours as needed. For pain         . CLONAZEPAM 0.5 MG PO TABS   Oral   Take 1 tablet (0.5 mg total) by mouth 2 (two) times daily.   30 tablet   0   . FOLIC ACID 1 MG PO TABS   Oral   Take 1 tablet (1 mg total) by mouth daily.   30  tablet   1   . GLIPIZIDE 5 MG PO TABS   Oral   Take 5 mg by mouth 2 (two) times daily before a meal.         . ISOSORBIDE MONONITRATE ER 30 MG PO TB24   Oral   Take 1 tablet (30 mg total) by mouth daily.   30 tablet   1   . LABETALOL HCL 300 MG PO TABS   Oral   Take 1 tablet (300 mg total) by mouth 2 (two) times daily.   60 tablet   1   . MAGNESIUM OXIDE 400 (241.3 MG) MG PO TABS   Oral   Take 1 tablet (400 mg total) by mouth daily.   30 tablet   1   . THIAMINE HCL 100 MG PO TABS   Oral   Take 1 tablet (100 mg total) by mouth daily.   30 tablet   1     BP 161/45  Pulse 59  Temp 99 F (37.2 C) (Rectal)  Resp 32  Ht 5\' 11"  (1.803 m)  Wt 174 lb (78.926 kg)  BMI 24.27 kg/m2  SpO2 100%  Physical Exam 1120: Physical examination:   Nursing notes reviewed; Vital signs and O2 SAT reviewed;  Constitutional: Disheveled, poor hygiene, thin, In no acute distress; Head:  Normocephalic, atraumatic; Eyes: EOMI, PERRL, No scleral icterus; ENMT: Mouth and pharynx normal, Mucous membranes dry and cracked; Neck: Supple, Full range of motion, No lymphadenopathy; Cardiovascular: Regular rate and rhythm, No gallop; Respiratory: Breath sounds coarse & equal bilaterally, No wheezes.  Speaking full sentences with ease, Normal respiratory effort/excursion; Chest: Nontender, Movement normal; Abdomen: Soft, Nontender, Nondistended, Normal bowel sounds;; Extremities: Pulses normal, No tenderness, No edema, No calf edema or asymmetry.; Neuro: AA&Ox3, Major CN grossly intact. No facial droop. Speech clear. No gross focal motor or sensory deficits in extremities.; Skin: Color normal, Warm, Dry.   ED Course  Procedures   1145:  Pt appears disheveled with poor hygiene, clothes and pt covered with feces and urine.  EMS notes pt's home conditions were very unsanitary with "bugs all over."  Pt cleaned by ED staff.  Pt will need Child psychotherapist consult.   1615:  No seizure activity while in the ED, remains A&O.  Unclear hx if pt had a seizure today.  Does have hx etoh abuse and etoh level <10 today; will need to be observed for etoh withdrawal.  Pt remains afebrile, Sats 98% on O2 2L N/C. SBP stable, ranging 130-160's.  +moist cough noted during ED stay.  No clear UTI on Udip, UC is pending.  BUN/Cr elevated from previous, will judiciously dose IVF for dehydration.  Will start abx for HCAP.  Dx and testing d/w pt.  Questions answered.  Verb understanding, agreeable to admit.  T/C to [facility] Dr. Suanne Marker, case discussed, including:  HPI, pertinent PM/SHx, VS/PE, dx testing, ED course and treatment:  Agreeable to admit, requests to write temporary orders, obtain tele bed to team 8.      MDM  MDM Reviewed: nursing note, vitals and previous chart Reviewed previous: labs  and ECG Interpretation: labs, ECG, x-ray and CT scan Total time providing critical care: 30-74 minutes. This excludes time spent performing separately reportable procedures and services. Consults: admitting MD   CRITICAL CARE Performed by: Laray Anger Total critical care time: 35 Critical care time was exclusive of separately billable procedures and treating other patients. Critical care was necessary to treat or prevent imminent or life-threatening  deterioration. Critical care was time spent personally by me on the following activities: development of treatment plan with patient and/or surrogate as well as nursing, discussions with consultants, evaluation of patient's response to treatment, examination of patient, obtaining history from patient or surrogate, ordering and performing treatments and interventions, ordering and review of laboratory studies, ordering and review of radiographic studies, pulse oximetry and re-evaluation of patient's condition.    Date: 04/01/2012  Rate: 117  Rhythm: sinus tachycardia  QRS Axis: normal  Intervals: normal  ST/T Wave abnormalities: normal  Conduction Disutrbances:right bundle branch block  Narrative Interpretation:   Old EKG Reviewed: unchanged; no significant changes from previous EKG dated 02/25/2012.  Results for orders placed during the hospital encounter of 04/01/12  GLUCOSE, CAPILLARY      Component Value Range   Glucose-Capillary 180 (*) 70 - 99 mg/dL  CBC WITH DIFFERENTIAL      Component Value Range   WBC 8.4  4.0 - 10.5 K/uL   RBC 3.61 (*) 4.22 - 5.81 MIL/uL   Hemoglobin 11.9 (*) 13.0 - 17.0 g/dL   HCT 40.9  81.1 - 91.4 %   MCV 130.5 (*) 78.0 - 100.0 fL   MCH 33.0  26.0 - 34.0 pg   MCHC 25.3 (*) 30.0 - 36.0 g/dL   RDW 78.2 (*) 95.6 - 21.3 %   Platelets 89 (*) 150 - 400 K/uL   Neutrophils Relative 70  43 - 77 %   Lymphocytes Relative 20  12 - 46 %   Monocytes Relative 10  3 - 12 %   Eosinophils Relative 0  0 - 5 %    Basophils Relative 0  0 - 1 %   Neutro Abs 5.9  1.7 - 7.7 K/uL   Lymphs Abs 1.7  0.7 - 4.0 K/uL   Monocytes Absolute 0.8  0.1 - 1.0 K/uL   Eosinophils Absolute 0.0  0.0 - 0.7 K/uL   Basophils Absolute 0.0  0.0 - 0.1 K/uL   RBC Morphology POLYCHROMASIA PRESENT     WBC Morphology INCREASED BANDS (>20% BANDS)     Smear Review LARGE PLATELETS PRESENT    COMPREHENSIVE METABOLIC PANEL      Component Value Range   Sodium 131 (*) 135 - 145 mEq/L   Potassium 4.5  3.5 - 5.1 mEq/L   Chloride 89 (*) 96 - 112 mEq/L   CO2 17 (*) 19 - 32 mEq/L   Glucose, Bld 184 (*) 70 - 99 mg/dL   BUN 45 (*) 6 - 23 mg/dL   Creatinine, Ser 0.86 (*) 0.50 - 1.35 mg/dL   Calcium 57.8  8.4 - 46.9 mg/dL   Total Protein 8.1  6.0 - 8.3 g/dL   Albumin 3.4 (*) 3.5 - 5.2 g/dL   AST 629 (*) 0 - 37 U/L   ALT 31  0 - 53 U/L   Alkaline Phosphatase 138 (*) 39 - 117 U/L   Total Bilirubin 1.6 (*) 0.3 - 1.2 mg/dL   GFR calc non Af Amer 31 (*) >90 mL/min   GFR calc Af Amer 36 (*) >90 mL/min  AMMONIA      Component Value Range   Ammonia 19  11 - 60 umol/L  URINALYSIS, ROUTINE W REFLEX MICROSCOPIC      Component Value Range   Color, Urine ORANGE (*) YELLOW   APPearance CLOUDY (*) CLEAR   Specific Gravity, Urine 1.026  1.005 - 1.030   pH 5.0  5.0 - 8.0   Glucose, UA NEGATIVE  NEGATIVE mg/dL   Hgb urine dipstick LARGE (*) NEGATIVE   Bilirubin Urine MODERATE (*) NEGATIVE   Ketones, ur 15 (*) NEGATIVE mg/dL   Protein, ur 161 (*) NEGATIVE mg/dL   Urobilinogen, UA 1.0  0.0 - 1.0 mg/dL   Nitrite POSITIVE (*) NEGATIVE   Leukocytes, UA TRACE (*) NEGATIVE  URINE RAPID DRUG SCREEN (HOSP PERFORMED)      Component Value Range   Opiates NONE DETECTED  NONE DETECTED   Cocaine NONE DETECTED  NONE DETECTED   Benzodiazepines NONE DETECTED  NONE DETECTED   Amphetamines NONE DETECTED  NONE DETECTED   Tetrahydrocannabinol NONE DETECTED  NONE DETECTED   Barbiturates NONE DETECTED  NONE DETECTED  ETHANOL      Component Value Range    Alcohol, Ethyl (B) <11  0 - 11 mg/dL  URINE MICROSCOPIC-ADD ON      Component Value Range   Squamous Epithelial / LPF MANY (*) RARE   WBC, UA 0-2  <3 WBC/hpf   RBC / HPF 0-2  <3 RBC/hpf   Bacteria, UA FEW (*) RARE   Casts HYALINE CASTS (*) NEGATIVE   Urine-Other MUCOUS PRESENT     Dg Chest 2 View 04/01/2012  *RADIOLOGY REPORT*  Clinical Data: Seizure, altered mental status, cough and congestion  CHEST - 2 VIEW  Comparison: Chest x-ray of 04/28/2011 and CT angio chest of 04/30/2011  Findings: There is parenchymal opacity in the right mid and lower lung field most consistent with right lower lobe and possibly right middle lobe pneumonia.  There may be a small right pleural effusion present.  The left lung appears clear.  Mediastinal contours appear normal.  The heart is within normal limits in size.  No bony abnormality is seen other than degenerative change throughout the thoracic spine.  IMPRESSION: Parenchymal opacity in the right lower lobe and possibly right middle lobe most consistent with pneumonia.  Recommend follow-up to ensure clearing.  Question of small right pleural effusion   Original Report Authenticated By: Dwyane Dee, M.D.    Ct Head Wo Contrast 04/01/2012  *RADIOLOGY REPORT*  Clinical Data: The patient was found having a seizure.  No known injury. History of diabetes, hypertension, and alcohol abuse.  CT HEAD WITHOUT CONTRAST  Technique:  Contiguous axial images were obtained from the base of the skull through the vertex without contrast.  Comparison: Prior CT head 07/01/2011.  Findings: There is no evidence for acute infarction, intracranial hemorrhage, mass lesion, hydrocephalus, or extra-axial fluid. Moderate atrophy is present.  Chronic microvascular ischemic change affects the periventricular subcortical white matter.  The calvarium is intact.  Sinuses and mastoids are clear.  Moderate vascular calcification is noted.  There is no acute sinus or mastoid disease.  The appearance is  similar to priors.  IMPRESSION: Chronic changes as described.  No visible acute stroke, hemorrhage, or intracranial mass lesion.   Original Report Authenticated By: Davonna Belling, M.D.     Results for JERMON, CHALFANT (MRN [id number]) as of 04/01/2012 16:22  Ref. Range 01/02/2012 04:17 01/05/2012 05:29 02/25/2012 04:04 02/25/2012 08:23 04/01/2012 14:00  BUN Latest Range: 6-23 mg/dL 16 10 23 20  45 (H)  Creatinine Latest Range: 0.50-1.35 mg/dL 8.11 9.14 7.82 (H) 9.56 1.98 (H)     Results for LYRIK, BURESH (MRN [id number]) as of 04/01/2012 16:22  Ref. Range 12/29/2011 01:20 12/30/2011 04:25 01/01/2012 04:33 02/25/2012 03:50 02/25/2012 04:04 02/25/2012 08:23 04/01/2012 14:00  Hemoglobin Latest Range: 13.0-17.0 g/dL 46.9 (L) 62.9 (L) 52.8 (L) 11.0 (L) 11.2 (  L) 10.9 (L) 11.9 (L)  HCT Latest Range: 39.0-52.0 % 33.2 (L) 36.4 (L) 33.2 (L) 31.4 (L) 33.0 (L) 32.0 (L) 47.1  Platelets Latest Range: 150-400 K/uL 222 180 161 157   89 (L)     Results for SUSANA, GRIPP (MRN [id number]) as of 04/01/2012 16:22  Ref. Range 04/28/2011 16:57 04/29/2011 08:50 04/30/2011 05:50 07/01/2011 17:15 08/26/2011 15:00 12/28/2011 22:51 04/01/2012 14:00  Alkaline Phosphatase Latest Range: 39-117 U/L 166 (H) 154 (H) 143 (H) 119 (H) 158 (H) 176 (H) 138 (H)  AST Latest Range: 0-37 U/L 33 26 24 25  52 (H) 38 (H) 117 (H)  ALT Latest Range: 0-53 U/L 17 15 13 16 29 24 31   Total Bilirubin Latest Range: 0.3-1.2 mg/dL 0.5 0.7 1.0 0.5 0.2 (L) 0.3 1.6 (H)      Laray Anger, DO 04/01/12 2050

## 2012-04-01 NOTE — ED Notes (Signed)
EMS reports pt roommates found pt on couch with seizure like activity, unknown length of time, incontinent, no other injuries

## 2012-04-01 NOTE — Progress Notes (Signed)
Patient ID: Johnny [name], male   DOB: 1935-04-04, 77 y.o.   MRN: [id number] CODE BLUE NOTE  Patient Name: Johnny [name]   MRN: [id number]   Date of Birth/ Sex: Apr 01, 1935 , male      Admission Date: 04/01/2012  Attending Provider: Kela Millin, MD  Primary Diagnosis: HCAP (healthcare-associated pneumonia)    Indication: Pt was in his usual state of health until this PM, when he was noted to be unresponsive. Code blue was subsequently called. At the time of arrival on scene, ACLS protocol was underway.    Technical Description:  - CPR performance duration:  23 minutes  - Was defibrillation or cardioversion used? No   - Was external pacer placed? No  - Was patient intubated pre/post CPR? Yes, during CPR    Medications Administered: Y = Yes; Blank = No Amiodarone    Atropine  Yes, x1  Calcium  Yes, x1 CaCl  Epinephrine  Yes, x4  Lidocaine    Magnesium    Norepinephrine    Phenylephrine    Sodium bicarbonate  Yes, x2  Vasopressin      Post CPR evaluation:  - Final Status - Was patient successfully resuscitated ? Yes - What is current rhythm? NSR - What is current hemodynamic status? 85/30, A- Line being inserted   Miscellaneous Information:  - Labs sent, including: CBG, BMP, CXR, EKG  - Primary team notified?  Yes- [facility]  - Family Notified? Unable to reach family at time of code.   - Additional notes/ transfer status:  2310 Code blue page to 5502, patient was found unresponsive by nursing team. Patient is currently admitted to [facility] for HCAP. He has a history HTN, DM, CAD and alcohol dependence. ACLS was underway from 2310 to 2333. Patient was transferred to [facility].    Natalia Leatherwood, DO  04/01/2012, 11:58 PM

## 2012-04-01 NOTE — Progress Notes (Signed)
Contacted Moriana again.  Informed that patient has a pulse and will be sent to ICU room 2909.  Daugther stated "okay."

## 2012-04-02 ENCOUNTER — Inpatient Hospital Stay (HOSPITAL_COMMUNITY): Payer: Medicare PPO

## 2012-04-02 DIAGNOSIS — G9340 Encephalopathy, unspecified: Secondary | ICD-10-CM

## 2012-04-02 DIAGNOSIS — I469 Cardiac arrest, cause unspecified: Secondary | ICD-10-CM

## 2012-04-02 DIAGNOSIS — A419 Sepsis, unspecified organism: Secondary | ICD-10-CM | POA: Diagnosis present

## 2012-04-02 DIAGNOSIS — J9601 Acute respiratory failure with hypoxia: Secondary | ICD-10-CM | POA: Diagnosis present

## 2012-04-02 DIAGNOSIS — J9600 Acute respiratory failure, unspecified whether with hypoxia or hypercapnia: Secondary | ICD-10-CM

## 2012-04-02 LAB — HEMOGLOBIN A1C
Hgb A1c MFr Bld: 5.8 % — ABNORMAL HIGH (ref <5.7)
Mean Plasma Glucose: 120 mg/dL — ABNORMAL HIGH (ref <117)

## 2012-04-02 LAB — URINALYSIS, ROUTINE W REFLEX MICROSCOPIC
Ketones, ur: 15 mg/dL — AB
Leukocytes, UA: NEGATIVE
Nitrite: NEGATIVE
Protein, ur: 100 mg/dL — AB
Urobilinogen, UA: 1 mg/dL (ref 0.0–1.0)
pH: 5 (ref 5.0–8.0)

## 2012-04-02 LAB — CARBOXYHEMOGLOBIN
Carboxyhemoglobin: 1 % (ref 0.5–1.5)
Carboxyhemoglobin: 1 % (ref 0.5–1.5)
Methemoglobin: 1 % (ref 0.0–1.5)
Methemoglobin: 0.9 % (ref 0.0–1.5)
Methemoglobin: 1.1 % (ref 0.0–1.5)
O2 Saturation: 57.5 %
Total hemoglobin: 9.5 g/dL — ABNORMAL LOW (ref 13.5–18.0)
Total hemoglobin: 9.2 g/dL — ABNORMAL LOW (ref 13.5–18.0)

## 2012-04-02 LAB — POCT I-STAT 3, ART BLOOD GAS (G3+)
Patient temperature: 98.7
TCO2: 19 mmol/L (ref 0–100)
TCO2: 21 mmol/L (ref 0–100)
TCO2: 27 mmol/L (ref 0–100)
pCO2 arterial: 13.6 mmHg — CL (ref 35.0–45.0)
pCO2 arterial: 30.5 mmHg — ABNORMAL LOW (ref 35.0–45.0)
pCO2 arterial: 32.6 mmHg — ABNORMAL LOW (ref 35.0–45.0)
pH, Arterial: 7.405 (ref 7.350–7.450)
pH, Arterial: 7.543 — ABNORMAL HIGH (ref 7.350–7.450)
pH, Arterial: 7.655 (ref 7.350–7.450)
pO2, Arterial: 96 mmHg (ref 80.0–100.0)

## 2012-04-02 LAB — FIBRINOGEN: Fibrinogen: 494 mg/dL — ABNORMAL HIGH (ref 204–475)

## 2012-04-02 LAB — BASIC METABOLIC PANEL
BUN: 41 mg/dL — ABNORMAL HIGH (ref 6–23)
CO2: 21 mEq/L (ref 19–32)
Chloride: 107 mEq/L (ref 96–112)
Creatinine, Ser: 1.39 mg/dL — ABNORMAL HIGH (ref 0.50–1.35)
Glucose, Bld: 125 mg/dL — ABNORMAL HIGH (ref 70–99)
Potassium: 3.6 mEq/L (ref 3.5–5.1)

## 2012-04-02 LAB — URINE MICROSCOPIC-ADD ON

## 2012-04-02 LAB — TYPE AND SCREEN: ABO/RH(D): B POS

## 2012-04-02 LAB — PROCALCITONIN: Procalcitonin: 18.02 ng/mL

## 2012-04-02 LAB — CBC
HCT: 27.6 % — ABNORMAL LOW (ref 39.0–52.0)
MCV: 90.5 fL (ref 78.0–100.0)
RBC: 3.05 MIL/uL — ABNORMAL LOW (ref 4.22–5.81)
WBC: 4.9 10*3/uL (ref 4.0–10.5)

## 2012-04-02 LAB — CORTISOL: Cortisol, Plasma: 50.8 ug/dL

## 2012-04-02 LAB — LACTIC ACID, PLASMA: Lactic Acid, Venous: 6.6 mmol/L — ABNORMAL HIGH (ref 0.5–2.2)

## 2012-04-02 LAB — INFLUENZA PANEL BY PCR (TYPE A & B): Influenza A By PCR: NEGATIVE

## 2012-04-02 LAB — COMPREHENSIVE METABOLIC PANEL
BUN: 54 mg/dL — ABNORMAL HIGH (ref 6–23)
CO2: 23 mEq/L (ref 19–32)
Chloride: 93 mEq/L — ABNORMAL LOW (ref 96–112)
Creatinine, Ser: 2.2 mg/dL — ABNORMAL HIGH (ref 0.50–1.35)
GFR calc non Af Amer: 28 mL/min — ABNORMAL LOW (ref >90)
Total Bilirubin: 1.2 mg/dL (ref 0.3–1.2)

## 2012-04-02 LAB — STREP PNEUMONIAE URINARY ANTIGEN: Strep Pneumo Urinary Antigen: NEGATIVE

## 2012-04-02 LAB — GLUCOSE, CAPILLARY: Glucose-Capillary: 142 mg/dL — ABNORMAL HIGH (ref 70–99)

## 2012-04-02 LAB — LEGIONELLA ANTIGEN, URINE

## 2012-04-02 LAB — TROPONIN I: Troponin I: <0.3 ng/mL (ref <0.30)

## 2012-04-02 LAB — APTT: aPTT: 39 seconds — ABNORMAL HIGH (ref 24–37)

## 2012-04-02 LAB — URINE CULTURE

## 2012-04-02 LAB — ABO/RH: ABO/RH(D): B POS

## 2012-04-02 LAB — HEPATITIS PANEL, ACUTE: Hep B C IgM: NEGATIVE

## 2012-04-02 MED ORDER — INSULIN ASPART 100 UNIT/ML ~~LOC~~ SOLN
1.0000 [IU] | SUBCUTANEOUS | Status: DC
  Administered 2012-04-02: 2 [IU] via SUBCUTANEOUS

## 2012-04-02 MED ORDER — INSULIN ASPART 100 UNIT/ML ~~LOC~~ SOLN
0.0000 [IU] | SUBCUTANEOUS | Status: DC
  Administered 2012-04-02: 1 [IU] via SUBCUTANEOUS
  Administered 2012-04-03: 2 [IU] via SUBCUTANEOUS
  Administered 2012-04-03: 1 [IU] via SUBCUTANEOUS
  Administered 2012-04-03: 2 [IU] via SUBCUTANEOUS
  Administered 2012-04-03 (×2): 1 [IU] via SUBCUTANEOUS
  Administered 2012-04-04 (×2): 3 [IU] via SUBCUTANEOUS
  Administered 2012-04-04: 2 [IU] via SUBCUTANEOUS
  Administered 2012-04-04: 5 [IU] via SUBCUTANEOUS
  Administered 2012-04-04: 3 [IU] via SUBCUTANEOUS
  Administered 2012-04-05: 5 [IU] via SUBCUTANEOUS
  Administered 2012-04-05: 2 [IU] via SUBCUTANEOUS
  Administered 2012-04-05: 3 [IU] via SUBCUTANEOUS
  Administered 2012-04-05: 5 [IU] via SUBCUTANEOUS
  Administered 2012-04-05: 3 [IU] via SUBCUTANEOUS
  Administered 2012-04-05: 2 [IU] via SUBCUTANEOUS
  Administered 2012-04-06: 5 [IU] via SUBCUTANEOUS
  Administered 2012-04-06: 9 [IU] via SUBCUTANEOUS
  Administered 2012-04-06: 5 [IU] via SUBCUTANEOUS
  Administered 2012-04-06: 3 [IU] via SUBCUTANEOUS
  Administered 2012-04-06: 1 [IU] via SUBCUTANEOUS
  Administered 2012-04-06: 2 [IU] via SUBCUTANEOUS

## 2012-04-02 MED ORDER — POTASSIUM CHLORIDE 10 MEQ/50ML IV SOLN
10.0000 meq | INTRAVENOUS | Status: AC
  Administered 2012-04-02 (×5): 10 meq via INTRAVENOUS
  Filled 2012-04-02: qty 200

## 2012-04-02 MED ORDER — VANCOMYCIN HCL IN DEXTROSE 1-5 GM/200ML-% IV SOLN: 1000.0000 mg | Freq: Once | INTRAVENOUS | Status: DC

## 2012-04-02 MED ORDER — SODIUM BICARBONATE 8.4 % IV SOLN
INTRAVENOUS | Status: AC
  Administered 2012-04-02: 50 meq
  Filled 2012-04-02: qty 100

## 2012-04-02 MED ORDER — NOREPINEPHRINE BITARTRATE 1 MG/ML IJ SOLN
2.0000 ug/min | INTRAVENOUS | Status: DC | PRN
  Administered 2012-04-02: 2 ug/min via INTRAVENOUS
  Filled 2012-04-02: qty 4

## 2012-04-02 MED ORDER — OSMOLITE 1.2 CAL PO LIQD
1000.0000 mL | ORAL | Status: DC
  Administered 2012-04-02 – 2012-04-04 (×3): 1000 mL
  Filled 2012-04-02 (×5): qty 1000

## 2012-04-02 MED ORDER — SODIUM CHLORIDE 0.9 % IV BOLUS (SEPSIS)
500.0000 mL | Freq: Once | INTRAVENOUS | Status: AC
  Administered 2012-04-02: 500 mL via INTRAVENOUS

## 2012-04-02 MED ORDER — SODIUM CHLORIDE 0.9 % IV BOLUS (SEPSIS): 500.0000 mL | INTRAVENOUS | Status: DC | PRN

## 2012-04-02 MED ORDER — SODIUM CHLORIDE 0.9 % IV SOLN
1.0000 mg/h | INTRAVENOUS | Status: DC
  Administered 2012-04-02 (×2): 4 mg/h via INTRAVENOUS
  Administered 2012-04-03: 2 mg/h via INTRAVENOUS
  Administered 2012-04-04: 1 mg/h via INTRAVENOUS
  Filled 2012-04-02 (×4): qty 10

## 2012-04-02 MED ORDER — PANTOPRAZOLE SODIUM 40 MG PO PACK
40.0000 mg | PACK | ORAL | Status: DC
  Administered 2012-04-03 – 2012-05-06 (×34): 40 mg
  Filled 2012-04-02 (×36): qty 20

## 2012-04-02 MED ORDER — PHENYLEPHRINE HCL 10 MG/ML IJ SOLN
30.0000 ug/min | INTRAMUSCULAR | Status: DC
  Administered 2012-04-02: 50 ug/min via INTRAVENOUS
  Filled 2012-04-02: qty 2

## 2012-04-02 MED ORDER — MIDAZOLAM BOLUS VIA INFUSION
1.0000 mg | INTRAVENOUS | Status: DC | PRN
  Filled 2012-04-02: qty 2

## 2012-04-02 MED ORDER — SODIUM CHLORIDE 0.9 % IV SOLN
25.0000 ug/h | INTRAVENOUS | Status: DC
  Administered 2012-04-02: 50 ug/h via INTRAVENOUS
  Administered 2012-04-03: 100 ug/h via INTRAVENOUS
  Filled 2012-04-02 (×2): qty 50

## 2012-04-02 MED ORDER — FENTANYL BOLUS VIA INFUSION
25.0000 ug | Freq: Four times a day (QID) | INTRAVENOUS | Status: DC | PRN
  Filled 2012-04-02: qty 100

## 2012-04-02 MED ORDER — SODIUM CHLORIDE 0.9 % IV SOLN
INTRAVENOUS | Status: DC
  Administered 2012-04-02: 07:00:00 via INTRAVENOUS

## 2012-04-02 MED ORDER — PRO-STAT SUGAR FREE PO LIQD
30.0000 mL | Freq: Three times a day (TID) | ORAL | Status: DC
  Administered 2012-04-03 – 2012-04-11 (×26): 30 mL
  Filled 2012-04-02 (×33): qty 30

## 2012-04-02 MED ORDER — SODIUM CHLORIDE 0.9 % IV BOLUS (SEPSIS)
25.0000 mL/kg | Freq: Once | INTRAVENOUS | Status: AC
  Administered 2012-04-02: 1890 mL via INTRAVENOUS

## 2012-04-02 MED ORDER — DOBUTAMINE IN D5W 4-5 MG/ML-% IV SOLN
2.5000 ug/kg/min | INTRAVENOUS | Status: DC | PRN
  Administered 2012-04-02: 2.5 ug/kg/min via INTRAVENOUS
  Filled 2012-04-02: qty 250

## 2012-04-02 NOTE — Significant Event (Signed)
Pt not breathing over vent.  ABG from earlier today showed relative respiratory alkalosis.  Will decrease set vent rate to 14 and f/u ABG in AM.  Coralyn Helling, MD 04/02/2012, 8:06 PM [phone]

## 2012-04-02 NOTE — Progress Notes (Signed)
Inpatient Diabetes Program Recommendations  AACE/ADA: New Consensus Statement on Inpatient Glycemic Control (2013)  Target Ranges:  Prepandial:   less than 140 mg/dL      Peak postprandial:   less than 180 mg/dL (1-2 hours)      Critically ill patients:  140 - 180 mg/dL   Reason for Visit: Elevated CBGs  77 yo male former smoker admitted 04/01/2012 with shakes and incontinent of urine/feces and found to have pneumonia in setting of ETOH abuse/withdrawal. Developed septic shock, and PEA on 12/13 with VDRF and 20 min before ROSC.   Results for Johnny [name], [name] (MRN [id number]) as of 04/02/2012 14:18  Ref. Range 04/02/2012 01:18  Sodium Latest Range: 135-145 mEq/L 137  Potassium Latest Range: 3.5-5.1 mEq/L 3.4 (L)  Chloride Latest Range: 96-112 mEq/L 93 (L)  CO2 Latest Range: 19-32 mEq/L 23  BUN Latest Range: 6-23 mg/dL 54 (H)  Creatinine Latest Range: 0.50-1.35 mg/dL 4.09 (H)  Calcium Latest Range: 8.4-10.5 mg/dL 9.9  GFR calc non Af Amer Latest Range: >90 mL/min 28 (L)  GFR calc Af Amer Latest Range: >90 mL/min 32 (L)  Glucose Latest Range: 70-99 mg/dL 811 (H)     Inpatient Diabetes Program Recommendations Insulin - Meal Coverage: Add Novolog 4 units Q 4 hours for TF coverage  Will continue to follow.

## 2012-04-02 NOTE — Consult Note (Signed)
PULMONARY  / CRITICAL CARE MEDICINE  Name: Johnny [name] MRN: [id number] DOB: 1935-04-11    LOS: 1  REFERRING MD :  TRH  CHIEF COMPLAINT:  Alcohol withdrawal/HCAP >> Cardiac arrest  BRIEF PATIENT DESCRIPTION:  77 yo male former smoker admitted 04/01/2012 with shakes and incontinent of urine/feces and found to have pneumonia in setting of ETOH abuse/withdrawal.  Developed septic shock, and PEA on 12/13 with VDRF and 20 min before ROSC.  PCCM assumed care from 12/13.  Significant PMHx ETOH, DM type II, CAD s/p CABG, A fib, HTN, Hyperlipidemia, Low back pain   LINES / TUBES: 12/12 ETT >> 12/12 L IJ CVL >> 12/13 Lt radial Aline>>  CULTURES: 12/12 resp >> 12/12 blood >> 12/12 urine >> 12/12 influenza > 12/12 urine strep >> negative 12/12 urine leg >>  ANTIBIOTICS: 12/12 Vanc (HCAP) >> 12/12 Cefepime (HCAP) >> 12/12 Levaqin (HCAP) >>  SIGNIFICANT EVENTS:  12/12 admission and PEA arrest  LEVEL OF CARE:  ICU PRIMARY SERVICE:  PCCM CONSULTANTS:  None CODE STATUS: full  DIET:  Tube feeds DVT Px:  Lovenox GI Px:  Pantoprazole   INTERVAL HISTORY:  Remains on pressors.  Open's eyes with stimulation.  VITAL SIGNS: Temp:  [97.5 F (36.4 C)-99 F (37.2 C)] 98 F (36.7 C) (12/13 0800) Pulse Rate:  [43-118] 96  (12/13 0800) Resp:  [17-41] 20  (12/13 0800) BP: (71-164)/(25-103) 115/59 mmHg (12/13 0800) SpO2:  [87 %-100 %] 100 % (12/13 0800) Arterial Line BP: (71-151)/(35-66) 120/35 mmHg (12/13 0800) FiO2 (%):  [40.2 %-100 %] 40.2 % (12/13 0800) Weight:  [166 lb 10.7 oz (75.6 kg)-174 lb (78.926 kg)] 166 lb 10.7 oz (75.6 kg) (12/12 2022) HEMODYNAMICS: CVP:  [4 mmHg-8 mmHg] 6 mmHg VENTILATOR SETTINGS: Vent Mode:  [-] PRVC FiO2 (%):  [40.2 %-100 %] 40.2 % Set Rate:  [18 bmp] 18 bmp Vt Set:  [480 mL] 480 mL PEEP:  [5 cmH20] 5 cmH20 Plateau Pressure:  [20 cmH20-23 cmH20] 20 cmH20 INTAKE / OUTPUT: Intake/Output      12/12 0701 - 12/13 0700 12/13 0701 - 12/14 0700   I.V.  (mL/kg) 291.6 (3.9)    IV Piggyback 3490    Total Intake(mL/kg) 3781.6 (50)    Urine (mL/kg/hr) 140 (0.1)    Total Output 140    Net +3641.6         Stool Occurrence 1 x      PHYSICAL EXAMINATION: Gen: No distress HEENT: ETT in place PULM: Decreased breath sounds, no wheeze CV: s1s2 regular, no murmur AB: decreased bowel sounds, soft, non tender Ext: no edema Derm: excoriations around sacral region Neuro: sedated    LABS: Cbc  Lab 04/02/12 0118 04/01/12 2054 04/01/12 1400  WBC 4.9 -- --  HGB 10.0* 11.6* 11.9*  HCT 27.6* 33.2* 47.1  PLT 108* 107* 89*    Chemistry   Lab 04/02/12 0118 04/01/12 2054 04/01/12 1400  NA 137 -- 131*  K 3.4* -- 4.5  CL 93* -- 89*  CO2 23 -- 17*  BUN 54* -- 45*  CREATININE 2.20* 2.12* 1.98*  CALCIUM 9.9 -- 10.2  MG -- -- --  PHOS -- -- --  GLUCOSE 147* -- 184*    Liver fxn  Lab 04/02/12 0118 04/01/12 1400  AST 236* 117*  ALT 61* 31  ALKPHOS 103 138*  BILITOT 1.2 1.6*  PROT 6.4 8.1  ALBUMIN 2.6* 3.4*   coags  Lab 04/02/12 0118  APTT 39*  INR 1.57*   Sepsis  markers  Lab 04/02/12 0130  LATICACIDVEN 6.6*  PROCALCITON 18.02   Cardiac markers  Lab 04/02/12 0610 04/02/12 0010  CKTOTAL -- --  CKMB -- --  TROPONINI 0.54* <0.30   BNP No results found for this basename: PROBNP:3 in the last 168 hours ABG  Lab 04/02/12 0420 04/02/12 0059 04/02/12 0031  PHART 7.405 7.543* 7.655*  PCO2ART 32.6* 30.5* 13.6*  PO2ART 136.0* 312.0* 96.0  HCO3 20.5 26.3* 17.9*  TCO2 21 27 19     CBG trend  Lab 04/02/12 0834 04/02/12 0001 04/01/12 2325 04/01/12 2121 04/01/12 1128  GLUCAP 159* 142* 154* 214* 180*    IMAGING:  Dg Chest 2 View  04/01/2012  *RADIOLOGY REPORT*  Clinical Data: Seizure, altered mental status, cough and congestion  CHEST - 2 VIEW  Comparison: Chest x-ray of 04/28/2011 and CT angio chest of 04/30/2011  Findings: There is parenchymal opacity in the right mid and lower lung field most consistent with right  lower lobe and possibly right middle lobe pneumonia.  There may be a small right pleural effusion present.  The left lung appears clear.  Mediastinal contours appear normal.  The heart is within normal limits in size.  No bony abnormality is seen other than degenerative change throughout the thoracic spine.  IMPRESSION: Parenchymal opacity in the right lower lobe and possibly right middle lobe most consistent with pneumonia.  Recommend follow-up to ensure clearing.  Question of small right pleural effusion   Original Report Authenticated By: Dwyane Dee, M.D.    Ct Head Wo Contrast  04/01/2012  *RADIOLOGY REPORT*  Clinical Data: The patient was found having a seizure.  No known injury. History of diabetes, hypertension, and alcohol abuse.  CT HEAD WITHOUT CONTRAST  Technique:  Contiguous axial images were obtained from the base of the skull through the vertex without contrast.  Comparison: Prior CT head 07/01/2011.  Findings: There is no evidence for acute infarction, intracranial hemorrhage, mass lesion, hydrocephalus, or extra-axial fluid. Moderate atrophy is present.  Chronic microvascular ischemic change affects the periventricular subcortical white matter.  The calvarium is intact.  Sinuses and mastoids are clear.  Moderate vascular calcification is noted.  There is no acute sinus or mastoid disease.  The appearance is similar to priors.  IMPRESSION: Chronic changes as described.  No visible acute stroke, hemorrhage, or intracranial mass lesion.   Original Report Authenticated By: Davonna Belling, M.D.    Dg Chest Port 1 View  04/02/2012  *RADIOLOGY REPORT*  Clinical Data: Central line placement.  PORTABLE CHEST - 1 VIEW  Comparison: 12/13/2013at 0219 hours  Findings: Endotracheal tube tip measures about 5.7 cm above the carina.  Interval placement of a left internal jugular venous catheter with tip in the upper SVC region.  Shallow inspiration. Stable appearance of left hilar and right mid and lower lung  consolidations.  No pneumothorax.  Postoperative changes in the mediastinum.  IMPRESSION: Interval placement of left central venous catheter with tip over the upper SVC region.  No pneumothorax.  Otherwise stable appearance of the chest.   Original Report Authenticated By: Burman Nieves, M.D.    Dg Chest Port 1 View  04/02/2012  *RADIOLOGY REPORT*  Clinical Data: Endotracheal tube placement after Code.  PORTABLE CHEST - 1 VIEW  Comparison: 04/01/2012  Findings: Interval placement of endotracheal tube with tip about 5.8 cm above the carina.  Shallow inspiration.  Increasing airspace disease in the right lung and developing airspace disease in the left lung base.  Changes suggest developing pneumonia.  No blunting of costophrenic angles.  No pneumothorax.  Calcification of the aorta.  Normal heart size and pulmonary vascularity.  Stable appearance of postoperative changes in the mediastinum.  IMPRESSION: Endotracheal tube placed with tip about 5.8 cm above the carina. Increasing airspace disease in both lungs.   Original Report Authenticated By: Burman Nieves, M.D.     DIAGNOSES: Principal Problem:  *HCAP (healthcare-associated pneumonia) Active Problems:  DIABETES MELLITUS, TYPE II  HYPERTENSION  Alcohol dependence  ARF (acute renal failure)  Volume depletion  Hyponatremia  Acute respiratory failure with hypoxia  Cardiac arrest  Septic shock(785.52)   ASSESSMENT / PLAN:  PULMONARY  ASSESSMENT: Acute respiratory failure in setting of PNA and cardiac arrest with hx of smoking. PLAN:   Full vent support until more stable F/u CXR  CARDIOVASCULAR  ASSESSMENT:  PEA. Likely related to pneumonia, acidosis, hypoxic respiratory failure in setting of ETOH withdrawal. Septic shock 2nd to PNA. PLAN:  Fluids to keep CVP > 8 Pressors to keep SBP > 90, MAP > 65 Dobutamine to keep SvO2 > 70 F/u Echo 12/13 >>  May benefit from albumin if pressure remains low  RENAL  ASSESSMENT:    Acute renal failure 2nd to shock. Baseline creatinine 0.95 from 01/05/12. Lactic acidosis related to shock and liver dysfx. PLAN:   Continue volume resuscitation Monitor renal fx, urine outpt, electrolytes F/u lactic acid level  GASTROINTESTINAL  ASSESSMENT:   Protein calorie malnutrition with hx of ETOH. ?ETOH cirrhosis PLAN:   Tube feeds while on vent Thiamine, folic acid, MVI F/u Abd u/s  HEMATOLOGIC  ASSESSMENT:   Anemia, thrombocytopenia likely related to ETOH and chronic disease with critical illness. PLAN:  F/u CBC Transfuse for Hb < 7  INFECTIOUS  ASSESSMENT:   Pneumonia PLAN:   -vanc/cefepime/levaquin -f/u cultures -check for influenza  ENDOCRINE  ASSESSMENT:   DM type II. PLAN:   SSI  NEUROLOGIC  ASSESSMENT:   EtOH withdrawal. Acute encephalopathy 2nd to sepsis, hypoxia, and cardiac arrest with ?anoxic injury. PLAN:   Limit sedation while on vent May need further neuro evaluation depending on progress  Critical care time 40 minutes  Coralyn Helling, MD [facility] Pulmonary/Critical Care 04/02/2012, 9:28 AM Pager:  [phone] After 3pm call: [phone]

## 2012-04-02 NOTE — Progress Notes (Signed)
OnCall chaplain responded to code event in 37.    Chaplain present for code and transfer to 2909.  Met pt's daughter upon arrival and provided support around pt's hospitalization and code.  Will continue to follow for support as needed throughout shift.  Will forward to unit chaplain in AM.    Royston Cowper, Bronson Ing  MDiv, Chaplain

## 2012-04-02 NOTE — Care Management Note (Addendum)
    Page 1 of 2   04/13/2012     12:47:10 PM   CARE MANAGEMENT NOTE 04/13/2012  Patient:  Johnny [name], Johnny [name]   Account Number:  [id number]  Date Initiated:  04/02/2012  Documentation initiated by:  Junius Creamer  Subjective/Objective Assessment:   adm w pneumonia     Action/Plan:   lives w fam, pcpdr [employer]   Anticipated DC Date:     Anticipated DC Plan:    In-house referral  Clinical Social Worker      DC Associate Professor  CM consult      [facility] Choice  LONG TERM ACUTE CARE   Choice offered to / List presented to:             Status of service:   Medicare Important Message given?   (If response is "NO", the following Medicare IM given date fields will be blank) Date Medicare IM given:   Date Additional Medicare IM given:    Discharge Disposition:    Per UR Regulation:  Reviewed for med. necessity/level of care/duration of stay  If discussed at Long Length of Stay Meetings, dates discussed:   04/08/2012  04/13/2012    Comments:  12/24 1244 debbie Darwin Guastella rn,bsn according to [facility]ven has 21 day in hospital rule before they will consider ltac. pt will not have 21 days until after 04-21-12. pt then starts new [employer] policy which has no out of network benefits and [facility] is not in network. will ck w sel[facility] to see if they are in network for [employer]'s new policy. sw involved in case vent snf needed also.  1223 1246p debbie Janya Eveland rn,bsn has trach. ltac's following but strict guidelines  for adm. sw involved in case vent snf or trach and snf needed.  12/20 1416 debbie Gabbriella Presswood rn,bsn have spoke w da and [place]. da in room is not Museum/gallery exhibitions officer. gave fam my card. have req screening by [facility] and [facility] in case ltac needed and made sw ref. [facility] states must be on vent 21 days before they will consider and [facility] state some benefits but they will change after 04-20-12.  12/13 9am debbie Norvin Ohlin rn,bsn [phone]

## 2012-04-02 NOTE — Procedures (Signed)
Arterial Catheter Insertion Procedure Note JOSHAU CODE [id number] [birth year]  Procedure: Insertion of Arterial Catheter  Indications: Blood pressure monitoring  Procedure Details Consent: Unable to obtain consent because of emergent medical necessity. Time Out: Verified patient identification, verified procedure, site/side was marked, verified correct patient position, special equipment/implants available, medications/allergies/relevent history reviewed, required imaging and test results available.  Performed  Maximum sterile technique was used including antiseptics, gloves, gown, hand hygiene and sheet. Skin prep: Chlorhexidine; local anesthetic administered 20 gauge catheter was inserted into left radial artery using the Seldinger technique.  Evaluation Blood flow good; BP tracing good. Complications: No apparent complications.   Marvis Saefong, Thelma Barge 04/02/2012

## 2012-04-02 NOTE — Consult Note (Signed)
WOC consult Note Reason for Consult:Consult requested for scrotum and buttocks.  Pt was unresponsive at home for unknown period of time. Had dried on layers of urine and stooll-soaked clothes and skin upon admission, according to nurses. Wound type:  Appearance consistent with moisture-associated skin damage. (MASD) Pressure Ulcer ZHY:QMVH is not a pressure ulcer. MASD was present on admission. Measurement: Patchy area affected to buttocks and sacrum approx 24X24cm. Scrotum both anterior and posterior areas with same appearance. Wound bed: Partial thickness skin loss with red macerated wound beds  Drainage (amount, consistency, odor) No odor or drainage. Periwound:  dry peeling edges to skin Dressing procedure/placement/frequency: Pt on Sport low air loss bed to re-distribute pressure.  Barrier cream to protect and promote healing. Will not plan to follow further unless re-consulted.  [address] RN, MSN, [employer]  [phone]

## 2012-04-02 NOTE — Progress Notes (Signed)
eLink Physician-Brief Progress Note Patient Name: Johnny [name] DOB: 1934-12-22 MRN: [id number]  Date of Service  04/02/2012   HPI/Events of Note  Hypokalemia   eICU Interventions  Potassium replaced   Intervention Category Minor Interventions: Electrolytes abnormality - evaluation and management  DETERDING,ELIZABETH 04/02/2012, 5:37 AM

## 2012-04-02 NOTE — Procedures (Signed)
Intubation Procedure Note EDMAR BLANKENBURG [id number] 1934-04-25  Procedure: Intubation Indications: Airway protection and maintenance  Procedure Details Consent: Unable to obtain consent because of emergent medical necessity. Time Out: Verified patient identification, verified procedure, site/side was marked, verified correct patient position, special equipment/implants available, medications/allergies/relevent history reviewed, required imaging and test results available.  Performed  Maximum sterile technique was used including antiseptics, cap, gloves, gown, hand hygiene, mask and sheet.  MAC and 3    Evaluation Hemodynamic Status: Persistent hypotension treated with pressors and fluid; O2 sats: transiently fell during during procedure Patient's Current Condition: stable Complications: No apparent complications Patient did tolerate procedure well. Chest X-ray ordered to verify placement.  CXR: pending.   Elmer Picker 04/02/2012

## 2012-04-02 NOTE — Consult Note (Signed)
PULMONARY  / CRITICAL CARE MEDICINE  Name: Johnny [name] MRN: [id number] DOB: 01/23/1935    LOS: 1  REFERRING MD :  TRH  CHIEF COMPLAINT:  Alcohol withdrawal/HCAP >> Cardiac arrest  BRIEF PATIENT DESCRIPTION: This is a 77 y/o male with DM2 and EtOH abuse who was admitted on 12/12 to [facility] with alcohol withdrawal and HCAP who had a PEA arrest shortly after admission to the med-surg floor. He underwent 20 minutes of CPR and was transferred to 2900.  After arrival to 2900 he was noted to be in shock, likely septic.  LINES / TUBES: 12/12 ETT >> 12/12 L IJ CVL >>  CULTURES: 12/12 resp >> 12/12 blood >> 12/12 urine >> 12/12 influenza > 12/12 urine strep >> 12/12 urine leg >>  ANTIBIOTICS: 12/12 Vanc (HCAP) >> 12/12 Cefepime (HCAP) >> 12/12 Levaqin (HCAP) >>  SIGNIFICANT EVENTS:  12/12 admission and PEA arrest  LEVEL OF CARE:  ICU PRIMARY SERVICE:  PCCM CONSULTANTS:   CODE STATUS: full  DIET:  npo DVT Px:  scd GI Px:  Pantoprazole  HISTORY OF PRESENT ILLNESS:  This is a 77 y/o male with DM2 and EtOH abuse who was admitted on 12/12 to [facility] with alcohol withdrawal and HCAP who had a PEA arrest shortly after admission to the med-surg floor. He underwent 20 minutes of CPR and was transferred to 2900.  Apparently he was breathing comfortably but confused upon arrival to the floor but later was found unresponsive by nursing staff.  He was found to be bradycardic and then developed PEA arrest.  He received 4 amps of epi and one of atropine.   His daughter states that he drinks to the point that he is drunk most of the time.  He was drunk when she last saw him 5 days prior to admission.   PAST MEDICAL HISTORY :  Past Medical History  Diagnosis Date  . Acute alcoholic hepatitis 05/20/2010  . Anemia of other chronic disease 08/16/2007  . Atrial fibrillation 08/12/2007  . CORONARY ARTERY DISEASE 12/11/2006  . HYPERTENSION 12/11/2006  . Hyperlipidemia   . ETOH abuse   . Chronic low back pain    . Pneumonia     "now; never before" (04/01/2012)  . Hallucination 04/01/2012  . Exertional dyspnea   . DIABETES MELLITUS, TYPE II 12/11/2006  . Arthritis     "think that's what's in my back" (04/01/2012)  . Altered mental status 04/01/2012  . Seizures 04/01/2012    seizure-like activity @ home today/notes 04/01/2012    Past Surgical History  Procedure Date  . Coronary artery bypass graft ~ 2003    CABG X4  . Cataract extraction w/ intraocular lens  implant, bilateral     "years ago" (04/01/2012)   Prior to Admission medications   Medication Sig Start Date End Date Taking? Authorizing Provider  acetaminophen (TYLENOL) 500 MG tablet Take 1,000 mg by mouth every 6 (six) hours as needed. For pain   Yes Historical Provider, MD  clonazePAM (KLONOPIN) 0.5 MG tablet Take 1 tablet (0.5 mg total) by mouth 2 (two) times daily. 01/05/12  Yes Vassie Loll, MD  folic acid (FOLVITE) 1 MG tablet Take 1 tablet (1 mg total) by mouth daily. 01/05/12  Yes Vassie Loll, MD  glipiZIDE (GLUCOTROL) 5 MG tablet Take 5 mg by mouth 2 (two) times daily before a meal.   Yes Historical Provider, MD  isosorbide mononitrate (IMDUR) 30 MG 24 hr tablet Take 1 tablet (30 mg total) by mouth  daily. 01/05/12  Yes Vassie Loll, MD  labetalol (NORMODYNE) 300 MG tablet Take 1 tablet (300 mg total) by mouth 2 (two) times daily. 01/16/12  Yes Gordy Savers, MD  magnesium oxide (MAG-OX) 400 (241.3 MG) MG tablet Take 1 tablet (400 mg total) by mouth daily. 01/05/12  Yes Vassie Loll, MD  thiamine 100 MG tablet Take 1 tablet (100 mg total) by mouth daily. 01/05/12  Yes Vassie Loll, MD   No Known Allergies  FAMILY HISTORY:  Family History  Problem Relation Age of Onset  . Heart failure Mother   . Aneurysm Sister   . Suicidality Brother   . Heart failure Brother   . Diabetes Sister    SOCIAL HISTORY:  reports that he quit smoking about 28 years ago. His smoking use included Cigarettes. He has a 8.5 pack-year smoking  history. He has never used smokeless tobacco. He reports that he drinks about 10.8 ounces of alcohol per week. He reports that he does not use illicit drugs.  REVIEW OF SYSTEMS:  Cannot obtain due to intubation   INTERVAL HISTORY:   VITAL SIGNS: Temp:  [97.5 F (36.4 C)-99 F (37.2 C)] 97.6 F (36.4 C) (12/12 2022) Pulse Rate:  [43-118] 77  (12/12 2022) Resp:  [18-38] 24  (12/12 2022) BP: (93-164)/(44-103) 164/80 mmHg (12/12 2022) SpO2:  [87 %-100 %] 100 % (12/12 2022) Weight:  [75.6 kg (166 lb 10.7 oz)-78.926 kg (174 lb)] 75.6 kg (166 lb 10.7 oz) (12/12 2022) HEMODYNAMICS:   VENTILATOR SETTINGS:   INTAKE / OUTPUT: Intake/Output      12/12 0701 - 12/13 0700       Stool Occurrence 1 x     PHYSICAL EXAMINATION: Gen: agitated on vent, localizes to pain HEENT: NCAT, PERRL, ETT in place PULM: Rhonchi bilaterally CV: Tachy, regular AB: BS+, soft, nontender, no hsm Ext: warm, no edema, no clubbing, no cyanosis Derm: no rash or skin breakdown Neuro: Agitated on vent, localizes to pain, does not follow commands    LABS: Cbc  Lab 04/01/12 2054 04/01/12 1400  WBC 9.5 --  HGB 11.6* 11.9*  HCT 33.2* 47.1  PLT 107* 89*    Chemistry   Lab 04/01/12 2054 04/01/12 1400  NA -- 131*  K -- 4.5  CL -- 89*  CO2 -- 17*  BUN -- 45*  CREATININE 2.12* 1.98*  CALCIUM -- 10.2  MG -- --  PHOS -- --  GLUCOSE -- 184*    Liver fxn  Lab 04/01/12 1400  AST 117*  ALT 31  ALKPHOS 138*  BILITOT 1.6*  PROT 8.1  ALBUMIN 3.4*   coags No results found for this basename: APTT:3,INR:3 in the last 168 hours Sepsis markers No results found for this basename: LATICACIDVEN:3,PROCALCITON:3 in the last 168 hours Cardiac markers No results found for this basename: CKTOTAL:3,CKMB:3,TROPONINI:3 in the last 168 hours BNP No results found for this basename: PROBNP:3 in the last 168 hours ABG  Lab 04/02/12 0031  PHART 7.655*  PCO2ART 13.6*  PO2ART 96.0  HCO3 17.9*  TCO2 19     CBG trend  Lab 04/01/12 2325 04/01/12 2121 04/01/12 1128  GLUCAP 154* 214* 180*    IMAGING: RLL airspace disease, ETT and CVL in place  ECG: sinus tach, RBBB, NSSTW changes lateral chest leads  DIAGNOSES: Principal Problem:  *HCAP (healthcare-associated pneumonia) Active Problems:  DIABETES MELLITUS, TYPE II  HYPERTENSION  Alcohol dependence  ARF (acute renal failure)  Volume depletion  Hyponatremia   ASSESSMENT / PLAN:  PULMONARY  ASSESSMENT: 1) Acute respiratory failure after cardiac arrest 2) HCAP vs aspiration pneumonia 3) Respiratory alkalosis due to EtOH withdrawal PLAN:   -sedate now with versed/fentanyl -repeat ABG in two hours after sedation -full vent support -see ID  CARDIOVASCULAR  ASSESSMENT:  1) Septic shock 2) PEA arrest, considering shock after intubation suspect that this was due to septic shock in setting of metabolic acidosis; EKG without ischaemic changes after code PLAN:  -sepsis protocol -monitor CVP -TTE in AM -troponin now  RENAL  ASSESSMENT:   1) AKI 2) Metabolic acidosis> likely lactic acidosis due to sepsis, consider ingestion given clinical history PLAN:   -check lactic acid -check serum osm -follow UOP -repeat BMET now  GASTROINTESTINAL  ASSESSMENT:   1) No acute issues PLAN:   -OG tube -stress ulcer prophylaxis  HEMATOLOGIC  ASSESSMENT:   1) Anemia, acute drop during day on 12/12, no sign of active bleeding; dilutional? PLAN:  -repeat CBC now -monitor for bleeding  INFECTIOUS  ASSESSMENT:   1) HCAP 2) Septic shock PLAN:   -vanc/cefepime/levaquin -f/u cultures -check for influenza  ENDOCRINE  ASSESSMENT:   1) DM2 PLAN:   -ICU hyperglycemia protocol  NEUROLOGIC  ASSESSMENT:   1) EtOH withdrawal 2) Acute encephalopathy: related to EtOH withdrawal and sepsis; after cardiac arrest he was not comatose and was in shock so no indication for induced hypothermia PLAN:   -versed gtt and fentanyl  gtt titrated to RASS -1 -daily WUA  Family: updated daughter at bedside.  She desires full code.  I advised that we limit further rounds of CPR should he develop cardiac arrest.  She wants to discuss this with family before making a decision.  CLINICAL SUMMARY: 77 y/o male who developed PEA arrest likely from septic shock after admission for HCAP and EtOH withdrawal.     I have personally obtained a history, examined the patient, evaluated laboratory and imaging results, formulated the assessment and plan and placed orders. CRITICAL CARE: The patient is critically ill with multiple organ systems failure and requires high complexity decision making for assessment and support, frequent evaluation and titration of therapies, application of advanced monitoring technologies and extensive interpretation of multiple databases. Critical Care Time devoted to patient care services described in this note is 90 minutes.    Pulmonary and Critical Care Medicine [facility] Pager: [phone]  04/02/2012, 12:53 AM

## 2012-04-02 NOTE — Progress Notes (Signed)
MD called about whether or not to continue with 500cc bolus for septic protocol; MD said not to continue with boluses.  Will continue to monitor and update as needed

## 2012-04-02 NOTE — H&P (Addendum)
Johnny Millin, MD Physician History and Physical 04/01/2012 5:35 PM  [facility] History and Physical   BEREL NAJJAR HQI:[id number] DOB: Aug 16, 1934 DOA: 04/01/2012   Referring physician: Dr Clarene Duke PCP: Rogelia Boga, MD   Specialists: none   Chief Complaint: Per the EMS found per roommates shaking/?seizure-like activity, incontinent   HPI: Johnny [name] is a 77 y.o. male with history of alcohol abuse, alcoholic hepatitis, CAD, status post CABG in the past, atrial fibrillation diabetes mellitus hypertension chronic low back pain who presents with above complaints-he is not a good historian. Per EMS patient's roommate/niece  found him on the couch "shaking"-unknown duration, was also incontinent-of urine and stool. Nursing staff report that patient was covered with layers of feces when he arrived in the ED. His room mate states that patient was awake- no loss of consciousness, patient is not able to tell or exactly aware of what happened. She does state that for the past 3days all he has had is alcohol, stating he has not had anything to eat or and has not taken any medications. He reports he drinks at least 3 times a day -any kind of alcohol he can find, and at least 3 days of the week. Reports his last drink was yesterday. He admits to a nonproductive cough x2-3 days,  to nausea vomitingx1 yesterday. He was seen in the ED and a chest x-ray was done which showed parenchymal opacity in the right lower lobe and possibly right middle lobe most consistent with pneumonia question small right pleural effusion. EKG revealed a sinus tachycardia with right bundle branch block. A CT scan of his head was done and showed chronic changes, no acute findings. His LFTs were noted to be elevated. He is admitted for further evaluation and management. Review of Systems: The patient denies anorexia, fever, weight loss,, vision loss, decreased hearing, hoarseness, chest pain, syncope, dyspnea on exertion,  peripheral edema, balance deficits, hemoptysis, abdominal pain, melena, hematochezia, severe indigestion/heartburn, hematuria suspicious skin lesions, transient blindness, difficulty walking, depression, unusual weight change.    Past Medical History   Diagnosis  Date   .  Acute alcoholic hepatitis  05/20/2010   .  Anemia of other chronic disease  08/16/2007   .  Atrial fibrillation  08/12/2007   .  CORONARY ARTERY DISEASE  12/11/2006   .  DIABETES MELLITUS, TYPE II  12/11/2006   .  HYPERTENSION  12/11/2006   .  Hyperlipidemia     .  ETOH abuse     .  Chronic low back pain      Past Surgical History   Procedure  Date   .  Coronary artery bypass graft      Social History: reports that he quit smoking about 28 years ago. He does not have any smokeless tobacco history on file. He reports that he drinks alcohol. He reports that he does not use illicit drugs. where does patient live- at home, his niece lives with him     No Known Allergies    Family History   Problem  Relation  Age of Onset   .  Heart failure  Mother     .  Aneurysm  Sister     .  Suicidality  Brother     .  Heart failure  Brother     .  Diabetes  Sister      Prior to Admission medications    Medication  Sig  Start Date  End Date  Taking?  Authorizing Provider   acetaminophen (TYLENOL) 500 MG tablet  Take 1,000 mg by mouth every 6 (six) hours as needed. For pain      Yes  Historical Provider, MD   clonazePAM (KLONOPIN) 0.5 MG tablet  Take 1 tablet (0.5 mg total) by mouth 2 (two) times daily.  01/05/12    Yes  Vassie Loll, MD   folic acid (FOLVITE) 1 MG tablet  Take 1 tablet (1 mg total) by mouth daily.  01/05/12    Yes  Vassie Loll, MD   glipiZIDE (GLUCOTROL) 5 MG tablet  Take 5 mg by mouth 2 (two) times daily before a meal.      Yes  Historical Provider, MD   isosorbide mononitrate (IMDUR) 30 MG 24 hr tablet  Take 1 tablet (30 mg total) by mouth daily.  01/05/12    Yes  Vassie Loll, MD   labetalol (NORMODYNE) 300 MG  tablet  Take 1 tablet (300 mg total) by mouth 2 (two) times daily.  01/16/12    Yes  Gordy Savers, MD   magnesium oxide (MAG-OX) 400 (241.3 MG) MG tablet  Take 1 tablet (400 mg total) by mouth daily.  01/05/12    Yes  Vassie Loll, MD   thiamine 100 MG tablet  Take 1 tablet (100 mg total) by mouth daily.  01/05/12    Yes  Vassie Loll, MD      Physical Exam: Filed Vitals:     04/01/12 1548  04/01/12 1615  04/01/12 1630  04/01/12 1645   BP:  161/45  149/74  109/58  138/103   Pulse:    118  116     Temp:           TempSrc:           Resp:  32  25  20  18    Height:  5\' 11"  (1.803 m)         Weight:  78.926 kg (174 lb)         SpO2:  100%  98%  100%        Constitutional: Vital signs reviewed.  Patient is a disheveled, well-developed  in no acute distress and cooperative with exam. Alert and oriented x3.   Head: Normocephalic and atraumatic Mouth: no erythema or exudates, dry MM Eyes: PERRL, EOMI, conjunctivae normal, No scleral icterus.  Neck: Supple, Trachea midline normal ROM, No JVD, mass, thyromegaly, or carotid bruit present.  Cardiovascular: Tachycardic, regular, S1 normal, S2 normal, no MRG, pulses symmetric and intact bilaterally Pulmonary/Chest: Rhonchi bilaterally, greater on right, no wheezes. Abdominal: Soft. Non-tender, non-distended, bowel sounds are normal, no masses, organomegaly, or guarding present.   GU: Scrotum denuded/excoriated  extremities: No cyanosis and no edema  Neurological: A&O x3, moves all extremities grossly, nonfocal sensory intact to light touch bilaterally.   Psychiatric: Normal mood and affect.    Labs on Admission:  Basic Metabolic Panel: Lab  04/01/12 4098   NA  131*   K  4.5   CL  89*   CO2  17*   GLUCOSE  184*   BUN  45*   CREATININE  1.98*   CALCIUM  10.2   MG  --   PHOS  --    Liver Function Tests: Lab  04/01/12 1400   AST  117*   ALT  31   ALKPHOS  138*   BILITOT  1.6*   PROT  8.1   ALBUMIN  3.4*    No  results  found for this basename: LIPASE:5,AMYLASE:5 in the last 168 hours Lab  04/01/12 1401   AMMONIA  19    CBC: Lab  04/01/12 1400   WBC  8.4   NEUTROABS  5.9   HGB  11.9*   HCT  47.1   MCV  130.5*   PLT  89*    Cardiac Enzymes: No results found for this basename: CKTOTAL:5,CKMB:5,CKMBINDEX:5,TROPONINI:5 in the last 168 hours   BNP (last 3 results) No results found for this basename: PROBNP:3 in the last 8760 hours CBG: Lab  04/01/12 1128   GLUCAP  180*      Radiological Exams on Admission: Dg Chest 2 View   04/01/2012  *RADIOLOGY REPORT*  Clinical Data: Seizure, altered mental status, cough and congestion  CHEST - 2 VIEW  Comparison: Chest x-ray of 04/28/2011 and CT angio chest of 04/30/2011  Findings: There is parenchymal opacity in the right mid and lower lung field most consistent with right lower lobe and possibly right middle lobe pneumonia.  There may be a small right pleural effusion present.  The left lung appears clear.  Mediastinal contours appear normal.  The heart is within normal limits in size.  No bony abnormality is seen other than degenerative change throughout the thoracic spine.  IMPRESSION: Parenchymal opacity in the right lower lobe and possibly right middle lobe most consistent with pneumonia.  Recommend follow-up to ensure clearing.  Question of small right pleural effusion   Original Report Authenticated By: Dwyane Dee, M.D.     Ct Head Wo Contrast   04/01/2012  *RADIOLOGY REPORT*  Clinical Data: The patient was found having a seizure.  No known injury. History of diabetes, hypertension, and alcohol abuse.  CT HEAD WITHOUT CONTRAST  Technique:  Contiguous axial images were obtained from the base of the skull through the vertex without contrast.  Comparison: Prior CT head 07/01/2011.  Findings: There is no evidence for acute infarction, intracranial hemorrhage, mass lesion, hydrocephalus, or extra-axial fluid. Moderate atrophy is present.  Chronic microvascular  ischemic change affects the periventricular subcortical white matter.  The calvarium is intact.  Sinuses and mastoids are clear.  Moderate vascular calcification is noted.  There is no acute sinus or mastoid disease.  The appearance is similar to priors.  IMPRESSION: Chronic changes as described.  No visible acute stroke, hemorrhage, or intracranial mass lesion.   Original Report Authenticated By: Davonna Belling, M.D.      EKG: Independently reviewed. Sinus at 117, RBB   Assessment/Plan Principal Problem:  *HCAP (healthcare-associated pneumonia) versus aspiration -As discussed above, he reports vomiting and has been drinking-plus possibility of aspirating, and was last discharged from the hospital September 16 (which is in the 90 day window for HCAP) -Empiric antibiotics with cefepime Vanco and Levaquin -ST for Swallow eval Active Problems:  Alcohol dependence/probable early withdrawal with ?alcohol related seizures -As discussed above his last drink was yesterday, he is tachycardic has a tremor on exam, mostly elevated BPs (although initially noted to be hypotensive) -Place on Ativan detox protocol -Obtain an EEG, alum and further treat accordingly pending evolution of clinical course. -SW consult for this abuse (also per ED nurses his living conditions were filthy,?placement) DIABETES MELLITUS, TYPE II -Monitor Accu-Cheks, sliding scale follow resume oral hypoglycemics when appropriate.  HYPERTENSION -Monitor blood pressures, hold parameters for meds, monitor and treat as appropriate.  ARF (acute renal failure) -Likely prerenal, hydrate follow and recheck  Volume depletion -As above hydrate follow and recheck  Hyponatremia -Likely  secondary to volume depletion, hydrate follow and recheck. Elevated LFTs -He has a history of alcoholic hepatitis-likely etiology. We'll also obtain acute hepatitis panel follow. History of atrial fibrillation -Currently sinus tach, was not on any aspirin and  would not be a good Coumadin candidate given his history. only labetalol listed on his home meds which is likely for his hypertension.       Code Status: full Family Communication: directly with pt at bedside, no family available Disposition Plan: admit to Tele   Time spent: >3mins   Johnny [name] [facility] Pager [phone]   If 7PM-7AM, please contact night-coverage [email or web address] Password [facility] 04/01/2012, 5:35 PM               Revision History...     Date/Time User Action   04/01/2012 6:31 PM Johnny Millin, MD Addend   04/01/2012 6:24 PM Miamor Ayler Joselyn Glassman, MD Sign  View Details Report   Routing History...     Date/Time From To Method   04/01/2012 6:31 PM Takaya Hyslop Joselyn Glassman, MD Johnny Millin, MD In [facility]   04/01/2012 6:31 PM Armina Galloway Joselyn Glassman, MD Gordy Savers, MD In [facility]

## 2012-04-02 NOTE — Progress Notes (Signed)
2300 Pt awake and alert, asking for something to eat.  Gave patient a little bit of chicken broth.  Pt coughed shortly after, but for a brief time.  When stepping out of room, IV team arrived to place IV.  2306 Told that patient's rhythm on monitor was 27 and asystole.  IV team present in patient's room and charge nurse attempting to wake pt.  Pt appeared to be breathing.  Attempted sternal rub, but patient was unresponsive.  Auscultated chest and could not hear heart sounds.  Checked for carotid pulse and unable to feel pulse.  Rapid response was paged and code blue initiated.

## 2012-04-02 NOTE — Progress Notes (Signed)
Portable EEG completed

## 2012-04-02 NOTE — Progress Notes (Signed)
INITIAL NUTRITION ASSESSMENT  DOCUMENTATION CODES Per approved criteria  -Not Applicable    INTERVENTION: 1. Initiate Osmolite 1.2 @ 20 ml/hr via OG and increase by 10 ml every 8 hours to goal rate of 45 ml/hr. No Pro-stat until pt has met goal rate with tf, then 30 ml Pro-stat TID via tube.  At goal rate this EN regimen will provide 1696 kcal, 120 gm protein, and 886 ml free water.   2. Monitor magnesium, potassium, and phosphorus daily for at least 3 days, MD to replete as needed, as pt is at risk for refeeding syndrome given likely severely malnourished, hx of Etoh abuse.  3. RD will continue to follow    NUTRITION DIAGNOSIS: Inadequate oral intake related to inability to eat as evidenced by NPO.   Goal: EN to meet >/=90% estimated nutrition needs  Monitor:  Vent status, refeeding labs, TF tolerance, weight  Reason for Assessment: Vent/Consult for initiation and management of EN  77 y.o. male  Admitting Dx: HCAP (healthcare-associated pneumonia)  ASSESSMENT: Pt found by family with possible seizure activity. With hx of Etoh abuse. Was on unit when found to be unresponsive, code blue, intubated to protect airway, PEA arrest.  Not on hypothermia protocol. Etoh withdrawal protocol.  Weight hx shows weight loss of 17 lbs in the past month, 9% body weight, severe weight loss. With hx of Etoh abuse, likely not meeting protein needs with po intake. Malnutrition screening tool indicates a poor appetite prior to admission. Nutrition focused physical exam deferred at this time r/t pt being intubated. Will continue to follow and address malnutrition as able.  Given that pt likely with some level of malnutrition, hx of Etoh abuse, pt at increased risk for refeeding syndrome. Recommend follow Magnesium, Phosphorus, and Potassium x3 days for s/s refeeding. RD will advance TF slowly.   Height: Ht Readings from Last 1 Encounters:  04/01/12 5' 10.5" (1.791 m)   Weight: Wt Readings from  Last 1 Encounters:  04/01/12 166 lb 10.7 oz (75.6 kg)   Ideal Body Weight: 76.8 kg   % Ideal Body Weight: 98%  Wt Readings from Last 10 Encounters:  04/01/12 166 lb 10.7 oz (75.6 kg)  03/03/12 183 lb (83.008 kg)  02/25/12 174 lb (78.926 kg)  02/18/12 176 lb (79.833 kg)  01/04/12 173 lb 1.6 oz (78.518 kg)  04/28/11 184 lb 15.5 oz (83.9 kg)  11/22/10 183 lb (83.008 kg)  08/16/10 186 lb (84.369 kg)  06/17/10 184 lb (83.462 kg)  05/20/10 185 lb (83.915 kg)   Usual Body Weight: ~185 lbs   % Usual Body Weight: 90%  BMI:  Body mass index is 23.58 kg/(m^2). WNL   Patient is currently intubated on ventilator support.  MV: 8.7 Temp:Temp (24hrs), Avg:98.1 F (36.7 C), Min:97.5 F (36.4 C), Max:99 F (37.2 C)  Propofol: none    Estimated Nutritional Needs: Kcal: 1719 Protein: 115-130 gm  Fluid: 1.7 L   Skin: skin tear on sacrum  Diet Order: NPO  EDUCATION NEEDS: -No education needs identified at this time   Intake/Output Summary (Last 24 hours) at 04/02/12 1032 Last data filed at 04/02/12 0930  Gross per 24 hour  Intake 4480.27 ml  Output    200 ml  Net 4280.27 ml   Last BM: incontinent of bladder and bowel PTA    Labs:   Lab 04/02/12 0118 04/01/12 2054 04/01/12 1400  NA 137 -- 131*  K 3.4* -- 4.5  CL 93* -- 89*  CO2 23 --  17*  BUN 54* -- 45*  CREATININE 2.20* 2.12* 1.98*  CALCIUM 9.9 -- 10.2  MG -- -- --  PHOS -- -- --  GLUCOSE 147* -- 184*   Sodium  Date/Time Value Range Status  04/02/2012  1:18 AM 137  135 - 145 mEq/L Final  04/01/2012  2:00 PM 131* 135 - 145 mEq/L Final  02/25/2012  8:23 AM 140  135 - 145 mEq/L Final    Potassium  Date/Time Value Range Status  04/02/2012  1:18 AM 3.4* 3.5 - 5.1 mEq/L Final  04/01/2012  2:00 PM 4.5  3.5 - 5.1 mEq/L Final  02/25/2012  8:23 AM 3.8  3.5 - 5.1 mEq/L Final      CBG (last 3)   Basename 04/02/12 0834 04/02/12 0001 04/01/12 2325  GLUCAP 159* 142* 154*    Scheduled Meds:   . antiseptic oral  rinse  15 mL Mouth Rinse QID  . ceFEPime (MAXIPIME) IV  2 g Intravenous Q24H  . chlorhexidine  15 mL Mouth Rinse BID  . enoxaparin  40 mg Subcutaneous Q24H  . folic acid  1 mg Oral Daily  . insulin aspart  0-9 Units Subcutaneous Q4H  . levofloxacin (LEVAQUIN) IV  750 mg Intravenous Q48H  . magnesium oxide  400 mg Oral Daily  . multivitamin with minerals  1 tablet Oral Daily  . pantoprazole sodium  40 mg Per Tube Q24H  . thiamine  100 mg Oral Daily  . vancomycin  1,000 mg Intravenous Q24H   Continuous Infusions:   . sodium chloride    . sodium chloride 100 mL/hr (04/02/12 0800)  . fentaNYL infusion INTRAVENOUS 50 mcg/hr (04/02/12 0900)  . midazolam (VERSED) infusion 2 mg/hr (04/02/12 0900)  . phenylephrine (NEO-SYNEPHRINE) Adult infusion Stopped (04/02/12 1610)    Past Medical History  Diagnosis Date  . Acute alcoholic hepatitis 05/20/2010  . Anemia of other chronic disease 08/16/2007  . Atrial fibrillation 08/12/2007  . CORONARY ARTERY DISEASE 12/11/2006  . HYPERTENSION 12/11/2006  . Hyperlipidemia   . ETOH abuse   . Chronic low back pain   . Pneumonia     "now; never before" (04/01/2012)  . Hallucination 04/01/2012  . Exertional dyspnea   . DIABETES MELLITUS, TYPE II 12/11/2006  . Arthritis     "think that's what's in my back" (04/01/2012)  . Altered mental status 04/01/2012  . Seizures 04/01/2012    seizure-like activity @ home today/notes 04/01/2012     Past Surgical History  Procedure Date  . Coronary artery bypass graft ~ 2003    CABG X4  . Cataract extraction w/ intraocular lens  implant, bilateral     "years ago" (04/01/2012)      Clarene Duke RD, LDN Pager [phone] After Hours pager [phone]

## 2012-04-02 NOTE — Progress Notes (Signed)
Clinical Social Work CSW attempted to meet with pt but he was sleeping and is on vent.  Unit CSW will meet with pt at a later time. Teon Hudnall,LCSW Covering CSW

## 2012-04-02 NOTE — Procedures (Signed)
Central Venous Catheter Insertion Procedure Note FRANCHOT POLLITT [id number] 1934/11/02  Procedure: Insertion of Central Venous Catheter Indications: Drug and/or fluid administration  Procedure Details Consent: Risks of procedure as well as the alternatives and risks of each were explained to the (patient/caregiver).  Consent for procedure obtained. Time Out: Verified patient identification, verified procedure, site/side was marked, verified correct patient position, special equipment/implants available, medications/allergies/relevent history reviewed, required imaging and test results available.  Performed  Maximum sterile technique was used including antiseptics, cap, gloves, gown, hand hygiene, mask and sheet. Skin prep: Chlorhexidine; local anesthetic administered A antimicrobial bonded/coated triple lumen catheter was placed in the left internal jugular vein using the Seldinger technique.  Ultrasound was used to verify the patency of the vein and for real time needle guidance.  Evaluation Blood flow good Complications: No apparent complications Patient did tolerate procedure well. Chest X-ray ordered to verify placement.  CXR: pending.  Jermayne Sweeney 04/02/2012, 12:49 AM

## 2012-04-02 NOTE — Progress Notes (Signed)
  Echocardiogram 2D Echocardiogram has been performed.  Yoshiaki Kreuser 04/02/2012, 9:59 AM

## 2012-04-03 ENCOUNTER — Inpatient Hospital Stay (HOSPITAL_COMMUNITY): Payer: Medicare PPO

## 2012-04-03 LAB — POCT I-STAT 3, ART BLOOD GAS (G3+)
Acid-base deficit: 4 mmol/L — ABNORMAL HIGH (ref 0.0–2.0)
O2 Saturation: 99 %
pO2, Arterial: 128 mmHg — ABNORMAL HIGH (ref 80.0–100.0)

## 2012-04-03 LAB — COMPREHENSIVE METABOLIC PANEL
ALT: 33 U/L (ref 0–53)
Alkaline Phosphatase: 80 U/L (ref 39–117)
CO2: 21 mEq/L (ref 19–32)
GFR calc Af Amer: 69 mL/min — ABNORMAL LOW (ref >90)
GFR calc non Af Amer: 59 mL/min — ABNORMAL LOW (ref >90)
Glucose, Bld: 157 mg/dL — ABNORMAL HIGH (ref 70–99)
Potassium: 3.3 mEq/L — ABNORMAL LOW (ref 3.5–5.1)
Sodium: 141 mEq/L (ref 135–145)

## 2012-04-03 LAB — CBC
MCHC: 36.3 g/dL — ABNORMAL HIGH (ref 30.0–36.0)
Platelets: 96 10*3/uL — ABNORMAL LOW (ref 150–400)
RDW: 15.3 % (ref 11.5–15.5)

## 2012-04-03 LAB — URINE CULTURE

## 2012-04-03 LAB — MAGNESIUM: Magnesium: 1.3 mg/dL — ABNORMAL LOW (ref 1.5–2.5)

## 2012-04-03 LAB — PHOSPHORUS: Phosphorus: 1.6 mg/dL — ABNORMAL LOW (ref 2.3–4.6)

## 2012-04-03 MED ORDER — LEVOFLOXACIN IN D5W 750 MG/150ML IV SOLN
750.0000 mg | INTRAVENOUS | Status: DC
  Administered 2012-04-03 – 2012-04-06 (×4): 750 mg via INTRAVENOUS
  Filled 2012-04-03 (×5): qty 150

## 2012-04-03 MED ORDER — MAGNESIUM SULFATE 40 MG/ML IJ SOLN
2.0000 g | Freq: Once | INTRAMUSCULAR | Status: AC
  Administered 2012-04-03: 2 g via INTRAVENOUS
  Filled 2012-04-03: qty 50

## 2012-04-03 MED ORDER — LEVOFLOXACIN IN D5W 750 MG/150ML IV SOLN
750.0000 mg | INTRAVENOUS | Status: DC
  Filled 2012-04-03: qty 150

## 2012-04-03 MED ORDER — VANCOMYCIN HCL 1000 MG IV SOLR
750.0000 mg | Freq: Two times a day (BID) | INTRAVENOUS | Status: DC
  Administered 2012-04-03 – 2012-04-04 (×2): 750 mg via INTRAVENOUS
  Filled 2012-04-03 (×3): qty 750

## 2012-04-03 MED ORDER — VANCOMYCIN HCL 1000 MG IV SOLR
750.0000 mg | Freq: Two times a day (BID) | INTRAVENOUS | Status: DC
  Filled 2012-04-03 (×2): qty 750

## 2012-04-03 MED ORDER — POTASSIUM PHOSPHATE DIBASIC 3 MMOLE/ML IV SOLN
20.0000 mmol | Freq: Once | INTRAVENOUS | Status: AC
  Administered 2012-04-03: 20 mmol via INTRAVENOUS
  Filled 2012-04-03: qty 6.67

## 2012-04-03 NOTE — Procedures (Signed)
EEG NUMBER:  [id number].  REFERRING PHYSICIAN:  Kela Millin, M.D.  INDICATION FOR STUDY:  A 77 year old man, on mechanical ventilation, who was admitted for possible seizures associated with alcohol withdrawal. Study is being performed to rule out seizure activity.  This is a routine EEG recording performed at the patient's bedside in Intensive Care Unit.  Predominant background activity consisted of 1-2 hertz diffuse, continuous, low-amplitude delta activity with superimposed, diffuse, low-amplitude beta activity.  Photic stimulation produced a minimal bilateral occipital driving response.  No epileptiform discharges were recorded.  There were no areas of disproportionate slowing focally.  INTERPRETATION:  This EEG showed low-amplitude moderate-to-severe generalized continuous slowing of cerebral activity, which is nonspecific.  This pattern of slowing can be seen with degenerative as well as metabolic and toxic encephalopathies.  No evidence of an epileptic disorder was demonstrated.     Noel Christmas, MD    [id number] D:  04/02/2012 12:44:04  T:  04/03/2012 00:25:32  Job #:  [id number]

## 2012-04-03 NOTE — Progress Notes (Signed)
ANTIBIOTIC CONSULT NOTE - FOLLOW UP  Pharmacy Consult for Vanc/Levaquin/Cefepime Indication: HCAP  No Known Allergies  Patient Measurements: Height: 5' 10.5" (179.1 cm) Weight: 178 lb 12.7 oz (81.1 kg) IBW/kg (Calculated) : 74.15   Vital Signs: Temp: 99.6 F (37.6 C) (12/14 1145) Temp src: Oral (12/14 1145) BP: 108/61 mmHg (12/14 1512) Pulse Rate: 93  (12/14 1500) Intake/Output from previous day: 12/13 0701 - 12/14 0700 In: 4243.1 [I.V.:2483.1; NG/GT:310; IV Piggyback:1450] Out: 970 [Urine:970] Intake/Output from this shift: Total I/O In: 1174.9 [I.V.:388.2; NG/GT:280; IV Piggyback:506.7] Out: 425 [Urine:425]  Labs:  [facility] 04/03/12 0500 04/02/12 1522 04/02/12 0118 04/01/12 2054  WBC 8.4 -- 4.9 9.5  HGB 8.2* -- 10.0* 11.6*  PLT 96* -- 108* 107*  LABCREA -- -- -- --  CREATININE 1.17 1.39* 2.20* --   Estimated Creatinine Clearance: 57.3 ml/min (by C-G formula based on Cr of 1.17). No results found for this basename: VANCOTROUGH:2,VANCOPEAK:2,VANCORANDOM:2,GENTTROUGH:2,GENTPEAK:2,GENTRANDOM:2,TOBRATROUGH:2,TOBRAPEAK:2,TOBRARND:2,AMIKACINPEAK:2,AMIKACINTROU:2,AMIKACIN:2, in the last 72 hours   Microbiology: Recent Results (from the past 720 hour(s))  URINE CULTURE     Status: Normal   Collection Time   04/01/12  2:40 PM      Component Value Range Status Comment   Specimen Description URINE, CATHETERIZED   Final    Special Requests NONE   Final    Culture  Setup Time 04/01/2012 15:34   Final    Colony Count NO GROWTH   Final    Culture NO GROWTH   Final    Report Status 04/02/2012 FINAL   Final   MRSA PCR SCREENING     Status: Normal   Collection Time   04/01/12 11:54 PM      Component Value Range Status Comment   MRSA by PCR NEGATIVE  NEGATIVE Final   URINE CULTURE     Status: Normal   Collection Time   04/02/12  2:04 AM      Component Value Range Status Comment   Specimen Description URINE, CATHETERIZED   Final    Special Requests Normal   Final    Culture   Setup Time 04/02/2012 08:25   Final    Colony Count NO GROWTH   Final    Culture NO GROWTH   Final    Report Status 04/03/2012 FINAL   Final     Anti-infectives     Start     Dose/Rate Route Frequency Ordered Stop   04/03/12 1800   levofloxacin (LEVAQUIN) IVPB 750 mg        750 mg 100 mL/hr over 90 Minutes Intravenous Every 48 hours 04/01/12 2040 04/07/12 1759   04/02/12 1800   vancomycin (VANCOCIN) IVPB 1000 mg/200 mL premix        1,000 mg 200 mL/hr over 60 Minutes Intravenous Every 24 hours 04/01/12 2040 04/09/12 1759   04/02/12 0100   vancomycin (VANCOCIN) IVPB 1000 mg/200 mL premix  Status:  Discontinued        1,000 mg 200 mL/hr over 60 Minutes Intravenous  Once 04/02/12 0058 04/02/12 0102   04/01/12 2200   ceFEPIme (MAXIPIME) 1 g in dextrose 5 % 50 mL IVPB  Status:  Discontinued        1 g 100 mL/hr over 30 Minutes Intravenous 3 times per day 04/01/12 2031 04/01/12 2040   04/01/12 2200   ceFEPIme (MAXIPIME) 2 g in dextrose 5 % 50 mL IVPB        2 g 100 mL/hr over 30 Minutes Intravenous Every 24 hours 04/01/12 2040 04/09/12 2159  04/01/12 2100   levofloxacin (LEVAQUIN) IVPB 750 mg  Status:  Discontinued        750 mg 100 mL/hr over 90 Minutes Intravenous Every 24 hours 04/01/12 2031 04/01/12 2040   04/01/12 1700   ceFEPIme (MAXIPIME) 2 g in dextrose 5 % 50 mL IVPB  Status:  Discontinued        2 g 100 mL/hr over 30 Minutes Intravenous Every 24 hours 04/01/12 1552 04/01/12 2031   04/01/12 1700   levofloxacin (LEVAQUIN) IVPB 750 mg  Status:  Discontinued        750 mg 100 mL/hr over 90 Minutes Intravenous Every 48 hours 04/01/12 1552 04/01/12 2031   04/01/12 1630   vancomycin (VANCOCIN) IVPB 1000 mg/200 mL premix        1,000 mg 200 mL/hr over 60 Minutes Intravenous  Once 04/01/12 1553 04/01/12 1716   04/01/12 1545   ceFEPIme (MAXIPIME) 1 g in dextrose 5 % 50 mL IVPB  Status:  Discontinued        1 g 100 mL/hr over 30 Minutes Intravenous 3 times per day 04/01/12  1536 04/01/12 1552   04/01/12 1545   levofloxacin (LEVAQUIN) IVPB 750 mg  Status:  Discontinued        750 mg 100 mL/hr over 90 Minutes Intravenous Every 24 hours 04/01/12 1536 04/01/12 1552          Assessment: 77 yo who was admitted for PNA. He was recently admitted back in September for alcohol withdrawal issues and admitted this stay for seizures like activity leading to cardiac arrest. CXR shows right lower lobe infiltrate so vanc/cefepime/levaquin have been ordered empirically until cultures result. Patient's renal function has improved since admission requiring antibiotic renal dose adjustment. WBC wnl and tmax of 99.6.   Goal of Therapy:  Vancomycin trough level 15-20 mcg/ml  Plan:  Change vancomycin to 750mg  IV q12h Change levaquin to 750mg  IV daily Continue cefepime at 2g IV q24h F/u renal function, cultures, and vanc trough if necessary  Thank you,  Brett Fairy, PharmD, BCPS 04/03/2012 3:27 PM  Juliann Pulse 04/03/2012,3:24 PM

## 2012-04-03 NOTE — Progress Notes (Signed)
PULMONARY  / CRITICAL CARE MEDICINE  Name: Johnny [name] MRN: [id number] DOB: 05-20-1934    LOS: 2  REFERRING MD :  TRH  CHIEF COMPLAINT:  Alcohol withdrawal/HCAP >> Cardiac arrest  BRIEF PATIENT DESCRIPTION:  77 yo male former smoker admitted 04/01/2012 with shakes and incontinent of urine/feces and found to have pneumonia in setting of ETOH abuse/withdrawal.  Developed septic shock, and PEA on 12/13 with VDRF and 20 min before ROSC.  PCCM assumed care from 12/13.  Significant PMHx ETOH, DM type II, CAD s/p CABG, A fib, HTN, Hyperlipidemia, Low back pain   LINES / TUBES: 12/12 ETT >> 12/12 L IJ CVL >> 12/13 Lt radial Aline>>12/14  CULTURES: 12/12 resp >> 12/12 blood >> 12/12 urine >>negative 12/12 influenza >negative 12/12 urine strep >> negative 12/12 urine leg >>negative  ANTIBIOTICS: 12/12 Vanc (HCAP) >> 12/12 Cefepime (HCAP) >> 12/12 Levaqin (HCAP) >>  SIGNIFICANT EVENTS:  12/12 admission and PEA arrest 12/14 Off pressors  TESTS: 12/12 CT head >> chronic microvascular ischemic changes 12/13 EEG >> moderate-to-severe generalized continuous slowing of cerebral activity 12/13 Echo >> EF 50 to 55% (tested while on dobutamine) 12/13 Abd u/s >> hepatic steatosis, medical renal disease, gallbladder sludge with small stones  LEVEL OF CARE:  ICU PRIMARY SERVICE:  PCCM CONSULTANTS:  None CODE STATUS: full  DIET:  Tube feeds DVT Px:  Lovenox GI Px:  Pantoprazole   INTERVAL HISTORY:  Off pressors.  Open's eyes with stimulation.    VITAL SIGNS: Temp:  [97.8 F (36.6 C)-99.5 F (37.5 C)] 99.5 F (37.5 C) (12/14 0715) Pulse Rate:  [91-116] 105  (12/14 0715) Resp:  [0-28] 17  (12/14 0715) BP: (109-156)/(48-77) 119/52 mmHg (12/14 0748) SpO2:  [100 %] 100 % (12/14 0715) Arterial Line BP: (90-145)/(38-77) 90/77 mmHg (12/14 0500) FiO2 (%):  [34.8 %-40.3 %] 35.1 % (12/14 0700) Weight:  [178 lb 12.7 oz (81.1 kg)] 178 lb 12.7 oz (81.1 kg) (12/14  0500) HEMODYNAMICS: CVP:  [3 mmHg-7 mmHg] 7 mmHg VENTILATOR SETTINGS: Vent Mode:  [-] PRVC FiO2 (%):  [34.8 %-40.3 %] 35.1 % Set Rate:  [18 bmp] 18 bmp Vt Set:  [480 mL] 480 mL PEEP:  [5 cmH20] 5 cmH20 Plateau Pressure:  [19 cmH20-21 cmH20] 20 cmH20 INTAKE / OUTPUT: Intake/Output      12/13 0701 - 12/14 0700 12/14 0701 - 12/15 0700   I.V. (mL/kg) 2483.1 (30.6)    NG/GT 310    IV Piggyback 1450    Total Intake(mL/kg) 4243.1 (52.3)    Urine (mL/kg/hr) 970 (0.5) 150   Total Output 970 150   Net +3273.1 -150          PHYSICAL EXAMINATION: Gen: No distress HEENT: ETT in place PULM: Decreased breath sounds, no wheeze CV: s1s2 regular, no murmur AB: decreased bowel sounds, soft, non tender Ext: no edema Derm: excoriations around sacral region Neuro: open's eyes with stimulation, not following commands    LABS: Cbc  Lab 04/03/12 0500 04/02/12 0118 04/01/12 2054  WBC 8.4 -- --  HGB 8.2* 10.0* 11.6*  HCT 22.6* 27.6* 33.2*  PLT 96* 108* 107*    Chemistry   Lab 04/03/12 0500 04/02/12 1522 04/02/12 0118  NA 141 140 137  K 3.3* 3.6 3.4*  CL 110 107 93*  CO2 21 21 23   BUN 34* 41* 54*  CREATININE 1.17 1.39* 2.20*  CALCIUM 8.6 8.7 9.9  MG 1.3* 1.3* --  PHOS 1.6* 2.3 --  GLUCOSE 157* 125* 147*  Liver fxn  Lab 04/03/12 0500 04/02/12 0118 04/01/12 1400  AST 66* 236* 117*  ALT 33 61* 31  ALKPHOS 80 103 138*  BILITOT 0.4 1.2 1.6*  PROT 5.3* 6.4 8.1  ALBUMIN 1.9* 2.6* 3.4*   coags  Lab 04/02/12 0118  APTT 39*  INR 1.57*   Sepsis markers  Lab 04/02/12 0130  LATICACIDVEN 6.6*  PROCALCITON 18.02   Cardiac markers  Lab 04/02/12 1522 04/02/12 0610 04/02/12 0010  CKTOTAL -- -- --  CKMB -- -- --  TROPONINI 0.34* 0.54* <0.30   BNP No results found for this basename: PROBNP:3 in the last 168 hours ABG  Lab 04/03/12 0430 04/02/12 0420 04/02/12 0059  PHART 7.392 7.405 7.543*  PCO2ART 34.0* 32.6* 30.5*  PO2ART 128.0* 136.0* 312.0*  HCO3 20.7 20.5 26.3*   TCO2 22 21 27     CBG trend  Lab 04/03/12 0052 04/02/12 2034 04/02/12 1519 04/02/12 1136 04/02/12 0834  GLUCAP 106* 106* 113* 130* 159*    IMAGING:  Dg Chest 2 View  04/01/2012  *RADIOLOGY REPORT*  Clinical Data: Seizure, altered mental status, cough and congestion  CHEST - 2 VIEW  Comparison: Chest x-ray of 04/28/2011 and CT angio chest of 04/30/2011  Findings: There is parenchymal opacity in the right mid and lower lung field most consistent with right lower lobe and possibly right middle lobe pneumonia.  There may be a small right pleural effusion present.  The left lung appears clear.  Mediastinal contours appear normal.  The heart is within normal limits in size.  No bony abnormality is seen other than degenerative change throughout the thoracic spine.  IMPRESSION: Parenchymal opacity in the right lower lobe and possibly right middle lobe most consistent with pneumonia.  Recommend follow-up to ensure clearing.  Question of small right pleural effusion   Original Report Authenticated By: Dwyane Dee, M.D.    Ct Head Wo Contrast  04/01/2012  *RADIOLOGY REPORT*  Clinical Data: The patient was found having a seizure.  No known injury. History of diabetes, hypertension, and alcohol abuse.  CT HEAD WITHOUT CONTRAST  Technique:  Contiguous axial images were obtained from the base of the skull through the vertex without contrast.  Comparison: Prior CT head 07/01/2011.  Findings: There is no evidence for acute infarction, intracranial hemorrhage, mass lesion, hydrocephalus, or extra-axial fluid. Moderate atrophy is present.  Chronic microvascular ischemic change affects the periventricular subcortical white matter.  The calvarium is intact.  Sinuses and mastoids are clear.  Moderate vascular calcification is noted.  There is no acute sinus or mastoid disease.  The appearance is similar to priors.  IMPRESSION: Chronic changes as described.  No visible acute stroke, hemorrhage, or intracranial mass lesion.    Original Report Authenticated By: Davonna Belling, M.D.    US Abdomen Complete  04/02/2012  *RADIOLOGY REPORT*  Clinical Data:  Altered liver function tests.  History of alcohol abuse.  COMPLETE ABDOMINAL ULTRASOUND  Comparison:  Ultrasound dated 09/11/2009 and a CT scan dated 09/11/2009  Findings:  Gallbladder:  There is sludge as well as several small stones in the gallbladder.  Gallbladder wall is not thickened.  Negative sonographic Murphy's sign.  Common bile duct:  Normal.  5 mm in diameter.  Liver:  Diffuse increased echogenicity of the liver parenchyma consistent with hepatic steatosis.  No focal lesions.  IVC:  Normal.  Pancreas:  Head and body of the pancreas and proximal tail appear normal.  The extreme tail is obscured by bowel gas.  Spleen:  Normal.  8.9 cm in length.  Right Kidney:  10.7 cm in length.  Echogenic renal parenchyma.  Left Kidney:  11.3 cm in length.  Echogenic renal parenchyma.  Abdominal aorta:  Maximal diameter 1.5 cm.  Calcification in the wall.  IMPRESSION: Hepatic steatosis.  Bilateral echogenic renal parenchyma consistent with renal medical disease.  No obstruction.  The sludge and small stones in the gallbladder.   Original Report Authenticated By: Francene Boyers, M.D.    Dg Chest Port 1 View  04/03/2012  *RADIOLOGY REPORT*  Clinical Data: Pneumonia.  PORTABLE CHEST - 1 VIEW  Comparison: 04/02/2012 and 04/01/2012  Findings: The endotracheal tube, central catheter, and NG tube appear in good position.  There has been slight improvement in the infiltrates in the right lung.  Left lung is clear.  Heart size and vascularity are normal.  IMPRESSION: Slight improvement in the pneumonia in the right lung.   Original Report Authenticated By: Francene Boyers, M.D.    Dg Chest Port 1 View  04/02/2012  *RADIOLOGY REPORT*  Clinical Data: Central line placement.  PORTABLE CHEST - 1 VIEW  Comparison: 12/13/2013at 0219 hours  Findings: Endotracheal tube tip measures about 5.7 cm above the  carina.  Interval placement of a left internal jugular venous catheter with tip in the upper SVC region.  Shallow inspiration. Stable appearance of left hilar and right mid and lower lung consolidations.  No pneumothorax.  Postoperative changes in the mediastinum.  IMPRESSION: Interval placement of left central venous catheter with tip over the upper SVC region.  No pneumothorax.  Otherwise stable appearance of the chest.   Original Report Authenticated By: Burman Nieves, M.D.    Dg Chest Port 1 View  04/02/2012  *RADIOLOGY REPORT*  Clinical Data: Endotracheal tube placement after Code.  PORTABLE CHEST - 1 VIEW  Comparison: 04/01/2012  Findings: Interval placement of endotracheal tube with tip about 5.8 cm above the carina.  Shallow inspiration.  Increasing airspace disease in the right lung and developing airspace disease in the left lung base.  Changes suggest developing pneumonia.  No blunting of costophrenic angles.  No pneumothorax.  Calcification of the aorta.  Normal heart size and pulmonary vascularity.  Stable appearance of postoperative changes in the mediastinum.  IMPRESSION: Endotracheal tube placed with tip about 5.8 cm above the carina. Increasing airspace disease in both lungs.   Original Report Authenticated By: Burman Nieves, M.D.     DIAGNOSES: Principal Problem:  *HCAP (healthcare-associated pneumonia) Active Problems:  DIABETES MELLITUS, TYPE II  HYPERTENSION  Alcohol dependence  ARF (acute renal failure)  Volume depletion  Hyponatremia  Acute respiratory failure with hypoxia  Cardiac arrest  Septic shock(785.52)  Encephalopathy   ASSESSMENT / PLAN:  PULMONARY  ASSESSMENT: Acute respiratory failure in setting of PNA and cardiac arrest with hx of smoking. PLAN:   Full vent support until more stable F/u CXR  CARDIOVASCULAR  ASSESSMENT:  PEA. Likely related to pneumonia, acidosis, hypoxic respiratory failure in setting of ETOH withdrawal. Septic shock 2nd to  PNA. Resolved 12/14. PLAN:  KVO IV fluids Keep in even fluid balance D/c dobutamine 12/14  RENAL  ASSESSMENT:   Acute renal failure 2nd to shock. Baseline creatinine 0.95 from 01/05/12. Lactic acidosis related to shock and liver dysfx. Resolved. Hypokalemia, hypomagnesemia, hypophosphatemia. PLAN:   Continue volume resuscitation Monitor renal fx, urine outpt Replace and f/u electrolytes as needed   GASTROINTESTINAL  ASSESSMENT:   Protein calorie malnutrition with hx of ETOH. Fatty liver 2nd to ETOH. PLAN:  Tube feeds while on vent Thiamine, folic acid, MVI  HEMATOLOGIC  ASSESSMENT:   Anemia, thrombocytopenia likely related to ETOH and chronic disease with critical illness. PLAN:  F/u CBC Transfuse for Hb < 7  INFECTIOUS  ASSESSMENT:   Pneumonia PLAN:   D3/x vancomycin, levaquin, cefepime >> narrow abx when cx results finalized  ENDOCRINE  ASSESSMENT:   DM type II. PLAN:   SSI  NEUROLOGIC  ASSESSMENT:   EtOH withdrawal. Acute encephalopathy 2nd to sepsis, hypoxia, and cardiac arrest with ?anoxic injury. PLAN:   Limit sedation while on vent May need further neuro evaluation depending on progress  Updated family at bedside.  Critical care time 40 minutes  Coralyn Helling, MD [facility] Pulmonary/Critical Care 04/03/2012, 10:32 AM Pager:  [phone] After 3pm call: [phone]

## 2012-04-04 ENCOUNTER — Inpatient Hospital Stay (HOSPITAL_COMMUNITY): Payer: Medicare PPO

## 2012-04-04 LAB — GLUCOSE, CAPILLARY
Glucose-Capillary: 131 mg/dL — ABNORMAL HIGH (ref 70–99)
Glucose-Capillary: 125 mg/dL — ABNORMAL HIGH (ref 70–99)
Glucose-Capillary: 143 mg/dL — ABNORMAL HIGH (ref 70–99)
Glucose-Capillary: 243 mg/dL — ABNORMAL HIGH (ref 70–99)
Glucose-Capillary: 213 mg/dL — ABNORMAL HIGH (ref 70–99)
Glucose-Capillary: 227 mg/dL — ABNORMAL HIGH (ref 70–99)

## 2012-04-04 LAB — MAGNESIUM: Magnesium: 1.6 mg/dL (ref 1.5–2.5)

## 2012-04-04 LAB — CBC
MCH: 32.7 pg (ref 26.0–34.0)
MCHC: 35.1 g/dL (ref 30.0–36.0)
RDW: 16 % — ABNORMAL HIGH (ref 11.5–15.5)

## 2012-04-04 LAB — BASIC METABOLIC PANEL
BUN: 30 mg/dL — ABNORMAL HIGH (ref 6–23)
Creatinine, Ser: 1.03 mg/dL (ref 0.50–1.35)
GFR calc Af Amer: 80 mL/min — ABNORMAL LOW (ref >90)
GFR calc non Af Amer: 69 mL/min — ABNORMAL LOW (ref >90)
Glucose, Bld: 167 mg/dL — ABNORMAL HIGH (ref 70–99)

## 2012-04-04 LAB — PHOSPHORUS: Phosphorus: 2 mg/dL — ABNORMAL LOW (ref 2.3–4.6)

## 2012-04-04 MED ORDER — POTASSIUM CHLORIDE 20 MEQ/15ML (10%) PO LIQD
ORAL | Status: AC
  Administered 2012-04-04: 40 meq
  Filled 2012-04-04: qty 30

## 2012-04-04 MED ORDER — ACETAMINOPHEN 160 MG/5ML PO SOLN
650.0000 mg | Freq: Four times a day (QID) | ORAL | Status: DC | PRN
  Administered 2012-04-04 – 2012-04-30 (×19): 650 mg
  Filled 2012-04-04 (×19): qty 20.3

## 2012-04-04 MED ORDER — FENTANYL CITRATE 0.05 MG/ML IJ SOLN
25.0000 ug | INTRAMUSCULAR | Status: DC | PRN
  Administered 2012-04-04 – 2012-04-05 (×3): 50 ug via INTRAVENOUS
  Filled 2012-04-04 (×3): qty 2

## 2012-04-04 MED ORDER — MIDAZOLAM HCL 2 MG/2ML IJ SOLN
1.0000 mg | INTRAMUSCULAR | Status: DC | PRN
  Administered 2012-04-04 – 2012-04-05 (×3): 2 mg via INTRAVENOUS
  Filled 2012-04-04 (×3): qty 2

## 2012-04-04 MED ORDER — FUROSEMIDE 10 MG/ML IJ SOLN
40.0000 mg | Freq: Four times a day (QID) | INTRAMUSCULAR | Status: AC
  Administered 2012-04-04 (×2): 40 mg via INTRAVENOUS
  Filled 2012-04-04 (×2): qty 4

## 2012-04-04 MED ORDER — POTASSIUM CHLORIDE 20 MEQ/15ML (10%) PO LIQD
40.0000 meq | Freq: Four times a day (QID) | ORAL | Status: AC
  Administered 2012-04-04 (×2): 40 meq
  Filled 2012-04-04 (×2): qty 30

## 2012-04-04 NOTE — Progress Notes (Signed)
45 cc (45 mg) versed wasted in sink followed with water flush; 100 cc (100 mg) fentanyl wasted in sink followed with water flush by Deneise Lever RN and Rica Mast RN.

## 2012-04-04 NOTE — Progress Notes (Signed)
PULMONARY  / CRITICAL CARE MEDICINE  Name: Johnny [name] MRN: [id number] DOB: 10-Feb-1935    LOS: 3  REFERRING MD :  TRH  CHIEF COMPLAINT:  Alcohol withdrawal/HCAP >> Cardiac arrest  BRIEF PATIENT DESCRIPTION:  77 yo male former smoker admitted 04/01/2012 with shakes and incontinent of urine/feces and found to have pneumonia in setting of ETOH abuse/withdrawal.  Developed septic shock, and PEA on 12/13 with VDRF and 20 min before ROSC.  PCCM assumed care from 12/13.  Significant PMHx ETOH, DM type II, CAD s/p CABG, A fib, HTN, Hyperlipidemia, Low back pain   LINES / TUBES: 12/12 ETT >> 12/12 L IJ CVL >> 12/13 Lt radial Aline>>12/14  CULTURES: 12/12 resp >> 12/12 blood >> 12/12 urine >>negative 12/12 influenza >negative 12/12 urine strep >> negative 12/12 urine leg >>negative  ANTIBIOTICS: 12/12 Vanc (HCAP) >>12/15 12/12 Cefepime (HCAP) >>12/15 12/12 Levaqin (HCAP) >>  SIGNIFICANT EVENTS:  12/12 admission and PEA arrest 12/14 Off pressors  TESTS: 12/12 CT head >> chronic microvascular ischemic changes 12/13 EEG >> moderate-to-severe generalized continuous slowing of cerebral activity 12/13 Echo >> EF 50 to 55% (tested while on dobutamine) 12/13 Abd u/s >> hepatic steatosis, medical renal disease, gallbladder sludge with small stones  LEVEL OF CARE:  ICU PRIMARY SERVICE:  PCCM CONSULTANTS:  None CODE STATUS: full  DIET:  Tube feeds DVT Px:  Lovenox GI Px:  Pantoprazole   INTERVAL HISTORY:  Open's eyes with stimulation.  Did not tolerate pressure support.  VITAL SIGNS: Temp:  [99.6 F (37.6 C)-100.6 F (38.1 C)] 100.6 F (38.1 C) (12/15 0800) Pulse Rate:  [89-118] 99  (12/15 0900) Resp:  [1-31] 19  (12/15 0900) BP: (98-145)/(48-75) 117/58 mmHg (12/15 0900) SpO2:  [99 %-100 %] 99 % (12/15 0900) FiO2 (%):  [29.7 %-34.9 %] 30 % (12/15 0900) Weight:  [171 lb 4.8 oz (77.7 kg)] 171 lb 4.8 oz (77.7 kg) (12/15 0500) HEMODYNAMICS: CVP:  [4 mmHg-5 mmHg] 4  mmHg VENTILATOR SETTINGS: Vent Mode:  [-] PRVC FiO2 (%):  [29.7 %-34.9 %] 30 % Set Rate:  [14 bmp] 14 bmp Vt Set:  [480 mL] 480 mL PEEP:  [5 cmH20] 5 cmH20 Plateau Pressure:  [19 cmH20-23 cmH20] 22 cmH20 INTAKE / OUTPUT: Intake/Output      12/14 0701 - 12/15 0700 12/15 0701 - 12/16 0700   I.V. (mL/kg) 902 (11.6) 51 (0.7)   NG/GT 1035 90   IV Piggyback 1056.7    Total Intake(mL/kg) 2993.6 (38.5) 141 (1.8)   Urine (mL/kg/hr) 1160 (0.6) 110   Total Output 1160 110   Net +1833.6 +31          PHYSICAL EXAMINATION: Gen: No distress HEENT: ETT in place PULM: Decreased breath sounds, no wheeze CV: s1s2 regular, no murmur AB: decreased bowel sounds, soft, non tender Ext: no edema Derm: excoriations around sacral region Neuro: open's eyes with stimulation, not following commands    LABS: Cbc  Lab 04/04/12 0400 04/03/12 0500 04/02/12 0118  WBC 11.8* -- --  HGB 8.6* 8.2* 10.0*  HCT 24.5* 22.6* 27.6*  PLT 109* 96* 108*    Chemistry   Lab 04/04/12 0400 04/03/12 0500 04/02/12 1522  NA 141 141 140  K 3.6 3.3* 3.6  CL 109 110 107  CO2 24 21 21   BUN 30* 34* 41*  CREATININE 1.03 1.17 1.39*  CALCIUM 8.8 8.6 8.7  MG 1.6 1.3* 1.3*  PHOS 2.0* 1.6* 2.3  GLUCOSE 167* 157* 125*    Liver fxn  Lab 04/03/12 0500 04/02/12 0118 04/01/12 1400  AST 66* 236* 117*  ALT 33 61* 31  ALKPHOS 80 103 138*  BILITOT 0.4 1.2 1.6*  PROT 5.3* 6.4 8.1  ALBUMIN 1.9* 2.6* 3.4*   coags  Lab 04/02/12 0118  APTT 39*  INR 1.57*   Sepsis markers  Lab 04/02/12 0130  LATICACIDVEN 6.6*  PROCALCITON 18.02   Cardiac markers  Lab 04/02/12 1522 04/02/12 0610 04/02/12 0010  CKTOTAL -- -- --  CKMB -- -- --  TROPONINI 0.34* 0.54* <0.30   BNP No results found for this basename: PROBNP:3 in the last 168 hours ABG  Lab 04/03/12 0430 04/02/12 0420 04/02/12 0059  PHART 7.392 7.405 7.543*  PCO2ART 34.0* 32.6* 30.5*  PO2ART 128.0* 136.0* 312.0*  HCO3 20.7 20.5 26.3*  TCO2 22 21 27     CBG  trend  Lab 04/04/12 0809 04/04/12 0406 04/03/12 2335 04/03/12 1948 04/03/12 1622  GLUCAP 243* 160* 134* 143* 182*    IMAGING:  US Abdomen Complete  04/02/2012  *RADIOLOGY REPORT*  Clinical Data:  Altered liver function tests.  History of alcohol abuse.  COMPLETE ABDOMINAL ULTRASOUND  Comparison:  Ultrasound dated 09/11/2009 and a CT scan dated 09/11/2009  Findings:  Gallbladder:  There is sludge as well as several small stones in the gallbladder.  Gallbladder wall is not thickened.  Negative sonographic Murphy's sign.  Common bile duct:  Normal.  5 mm in diameter.  Liver:  Diffuse increased echogenicity of the liver parenchyma consistent with hepatic steatosis.  No focal lesions.  IVC:  Normal.  Pancreas:  Head and body of the pancreas and proximal tail appear normal.  The extreme tail is obscured by bowel gas.  Spleen:  Normal.  8.9 cm in length.  Right Kidney:  10.7 cm in length.  Echogenic renal parenchyma.  Left Kidney:  11.3 cm in length.  Echogenic renal parenchyma.  Abdominal aorta:  Maximal diameter 1.5 cm.  Calcification in the wall.  IMPRESSION: Hepatic steatosis.  Bilateral echogenic renal parenchyma consistent with renal medical disease.  No obstruction.  The sludge and small stones in the gallbladder.   Original Report Authenticated By: Francene Boyers, M.D.    Dg Chest Port 1 View  04/04/2012  *RADIOLOGY REPORT*  Clinical Data:  pneumonia  PORTABLE CHEST - 1 VIEW  Comparison: Chest radiograph 04/03/2012  Findings: Endotracheal tube and central venous line unchanged.  NG tube noted.  Normal heart silhouette.  There is air space opacity in the right lower lobe suggesting pneumonia.  No effusion or pneumothorax.  IMPRESSION: No change in the right lower lobe pneumonia pattern.  Stable support apparatus.   Original Report Authenticated By: Genevive Bi, M.D.    Dg Chest Port 1 View  04/03/2012  *RADIOLOGY REPORT*  Clinical Data: Pneumonia.  PORTABLE CHEST - 1 VIEW  Comparison: 04/02/2012  and 04/01/2012  Findings: The endotracheal tube, central catheter, and NG tube appear in good position.  There has been slight improvement in the infiltrates in the right lung.  Left lung is clear.  Heart size and vascularity are normal.  IMPRESSION: Slight improvement in the pneumonia in the right lung.   Original Report Authenticated By: Francene Boyers, M.D.     DIAGNOSES: Principal Problem:  *HCAP (healthcare-associated pneumonia) Active Problems:  DIABETES MELLITUS, TYPE II  HYPERTENSION  Alcohol dependence  ARF (acute renal failure)  Volume depletion  Hyponatremia  Acute respiratory failure with hypoxia  Cardiac arrest  Septic shock(785.52)  Encephalopathy   ASSESSMENT / PLAN:  PULMONARY  ASSESSMENT: Acute respiratory failure in setting of PNA and cardiac arrest with hx of smoking. PLAN:   Full vent support until more stable F/u CXR  CARDIOVASCULAR  ASSESSMENT:  PEA. Likely related to pneumonia, acidosis, hypoxic respiratory failure in setting of ETOH withdrawal. Septic shock 2nd to PNA. Resolved 12/14. Hypervolemia. PLAN:  Lasix 40 mg IV q6h x 2 doses 12/15   RENAL  ASSESSMENT:   Acute renal failure 2nd to shock. Baseline creatinine 0.95 from 01/05/12.  Resolved 12/15. Lactic acidosis related to shock and liver dysfx. Resolved. Hypokalemia, hypomagnesemia, hypophosphatemia. PLAN:   Monitor renal fx, urine outpt Replace and f/u electrolytes as needed   GASTROINTESTINAL  ASSESSMENT:   Protein calorie malnutrition with hx of ETOH. Fatty liver 2nd to ETOH. PLAN:   Tube feeds while on vent Thiamine, folic acid, MVI  HEMATOLOGIC  ASSESSMENT:   Anemia, thrombocytopenia likely related to ETOH and chronic disease with critical illness. PLAN:  F/u CBC Transfuse for Hb < 7  INFECTIOUS  ASSESSMENT:   Pneumonia PLAN:   D4/x levaquin D/c vancomycin/ cefepime 12/15  ENDOCRINE  ASSESSMENT:   DM type II. PLAN:   SSI  NEUROLOGIC  ASSESSMENT:    EtOH withdrawal. Acute encephalopathy 2nd to sepsis, hypoxia, and cardiac arrest with ?anoxic injury. PLAN:   Change to intermittent sedation protocol 12/15 May need further neuro evaluation depending on progress  Critical care time 35 minutes  Coralyn Helling, MD [facility] Pulmonary/Critical Care 04/04/2012, 10:06 AM Pager:  [phone] After 3pm call: [phone]

## 2012-04-04 NOTE — Progress Notes (Signed)
Per Nursing- patient remains intubated and is not able to respond to CSW for assessment.  CSW will continue to monitor. Lorri Frederick. [place]  [phone]

## 2012-04-05 ENCOUNTER — Inpatient Hospital Stay (HOSPITAL_COMMUNITY): Payer: Medicare PPO

## 2012-04-05 DIAGNOSIS — K7010 Alcoholic hepatitis without ascites: Secondary | ICD-10-CM

## 2012-04-05 LAB — BASIC METABOLIC PANEL
BUN: 35 mg/dL — ABNORMAL HIGH (ref 6–23)
Chloride: 105 mEq/L (ref 96–112)
GFR calc Af Amer: 71 mL/min — ABNORMAL LOW (ref >90)
GFR calc non Af Amer: 61 mL/min — ABNORMAL LOW (ref >90)
Glucose, Bld: 175 mg/dL — ABNORMAL HIGH (ref 70–99)
Potassium: 4.1 mEq/L (ref 3.5–5.1)
Sodium: 141 mEq/L (ref 135–145)

## 2012-04-05 LAB — GLUCOSE, CAPILLARY
Glucose-Capillary: 183 mg/dL — ABNORMAL HIGH (ref 70–99)
Glucose-Capillary: 219 mg/dL — ABNORMAL HIGH (ref 70–99)
Glucose-Capillary: 276 mg/dL — ABNORMAL HIGH (ref 70–99)
Glucose-Capillary: 287 mg/dL — ABNORMAL HIGH (ref 70–99)

## 2012-04-05 LAB — CBC
HCT: 26 % — ABNORMAL LOW (ref 39.0–52.0)
Hemoglobin: 9 g/dL — ABNORMAL LOW (ref 13.0–17.0)
MCHC: 34.6 g/dL (ref 30.0–36.0)
RBC: 2.77 MIL/uL — ABNORMAL LOW (ref 4.22–5.81)

## 2012-04-05 MED ORDER — MIDAZOLAM HCL 2 MG/2ML IJ SOLN
2.0000 mg | INTRAMUSCULAR | Status: DC | PRN
  Administered 2012-04-07: 2 mg via INTRAVENOUS
  Administered 2012-04-08: 4 mg via INTRAVENOUS
  Administered 2012-04-09: 2 mg via INTRAVENOUS
  Administered 2012-04-09 (×3): 4 mg via INTRAVENOUS
  Filled 2012-04-05: qty 4
  Filled 2012-04-05: qty 2
  Filled 2012-04-05 (×4): qty 4

## 2012-04-05 MED ORDER — METOLAZONE 5 MG PO TABS
5.0000 mg | ORAL_TABLET | Freq: Every day | ORAL | Status: AC
  Administered 2012-04-05: 5 mg via ORAL
  Filled 2012-04-05: qty 1

## 2012-04-05 MED ORDER — ADULT MULTIVITAMIN LIQUID CH
5.0000 mL | Freq: Every day | ORAL | Status: DC
  Administered 2012-04-06 – 2012-04-22 (×15): 5 mL via ORAL
  Filled 2012-04-05 (×19): qty 5

## 2012-04-05 MED ORDER — DEXMEDETOMIDINE HCL IN NACL 200 MCG/50ML IV SOLN
0.2000 ug/kg/h | INTRAVENOUS | Status: AC
  Administered 2012-04-05: 0.6 ug/kg/h via INTRAVENOUS
  Administered 2012-04-05: 0.2 ug/kg/h via INTRAVENOUS
  Administered 2012-04-05: 0.6 ug/kg/h via INTRAVENOUS
  Administered 2012-04-06: 0.7 ug/kg/h via INTRAVENOUS
  Administered 2012-04-06 (×2): 0.6 ug/kg/h via INTRAVENOUS
  Administered 2012-04-06: 0.3 ug/kg/h via INTRAVENOUS
  Administered 2012-04-07 (×3): 1 ug/kg/h via INTRAVENOUS
  Administered 2012-04-07: 0.6 ug/kg/h via INTRAVENOUS
  Administered 2012-04-07: 0.8 ug/kg/h via INTRAVENOUS
  Administered 2012-04-07: 1 ug/kg/h via INTRAVENOUS
  Administered 2012-04-07: 0.9 ug/kg/h via INTRAVENOUS
  Administered 2012-04-07: 0.8 ug/kg/h via INTRAVENOUS
  Administered 2012-04-07 – 2012-04-08 (×6): 1 ug/kg/h via INTRAVENOUS
  Filled 2012-04-05 (×22): qty 50

## 2012-04-05 MED ORDER — MAGNESIUM SULFATE 40 MG/ML IJ SOLN
2.0000 g | Freq: Once | INTRAMUSCULAR | Status: AC
  Administered 2012-04-05: 2 g via INTRAVENOUS
  Filled 2012-04-05: qty 50

## 2012-04-05 MED ORDER — FENTANYL CITRATE 0.05 MG/ML IJ SOLN
25.0000 ug | INTRAMUSCULAR | Status: DC | PRN
  Administered 2012-04-06 – 2012-04-08 (×5): 50 ug via INTRAVENOUS
  Administered 2012-04-09: 100 ug via INTRAVENOUS
  Administered 2012-04-09 – 2012-04-28 (×45): 50 ug via INTRAVENOUS
  Filled 2012-04-05 (×51): qty 2

## 2012-04-05 MED ORDER — FUROSEMIDE 10 MG/ML IJ SOLN
40.0000 mg | Freq: Four times a day (QID) | INTRAMUSCULAR | Status: AC
  Administered 2012-04-05 – 2012-04-06 (×3): 40 mg via INTRAVENOUS
  Filled 2012-04-05 (×3): qty 4

## 2012-04-05 MED ORDER — GERHARDT'S BUTT CREAM
TOPICAL_CREAM | CUTANEOUS | Status: DC | PRN
  Administered 2012-04-06: 1 via TOPICAL
  Administered 2012-04-13 (×2): via TOPICAL
  Administered 2012-04-14: 1 via TOPICAL
  Filled 2012-04-05 (×2): qty 1

## 2012-04-05 MED ORDER — JEVITY 1.2 CAL PO LIQD
1000.0000 mL | ORAL | Status: DC
  Administered 2012-04-05: 16:00:00
  Administered 2012-04-06: 1000 mL
  Administered 2012-04-07: 13:00:00
  Administered 2012-04-08 – 2012-04-09 (×2): 1000 mL
  Filled 2012-04-05 (×15): qty 1000

## 2012-04-05 MED ORDER — K PHOS MONO-SOD PHOS DI & MONO 155-852-130 MG PO TABS
500.0000 mg | ORAL_TABLET | Freq: Two times a day (BID) | ORAL | Status: AC
  Administered 2012-04-05 – 2012-04-06 (×4): 500 mg via ORAL
  Filled 2012-04-05 (×4): qty 2

## 2012-04-05 MED FILL — Medication: Qty: 1 | Status: AC

## 2012-04-05 NOTE — Progress Notes (Signed)
Inpatient Diabetes Program Recommendations  AACE/ADA: New Consensus Statement on Inpatient Glycemic Control (2013)  Target Ranges:  Prepandial:   less than 140 mg/dL      Peak postprandial:   less than 180 mg/dL (1-2 hours)      Critically ill patients:  140 - 180 mg/dL   Results for PILOT, PRINDLE (MRN [id number]) as of 04/05/2012 12:20  Ref. Range 04/04/2012 19:47 04/05/2012 00:23 04/05/2012 04:10 04/05/2012 08:03 04/05/2012 11:48  Glucose-Capillary Latest Range: 70-99 mg/dL 621 (H) 308 (H) 657 (H) 219 (H) 287 (H)   Inpatient Diabetes Program Recommendations Insulin - Meal Coverage: Add Novolog 4 units Q 4 hours for TF coverage Thank you  Piedad Climes [facility] Inpatient Diabetes Coordinator [phone]

## 2012-04-05 NOTE — Progress Notes (Signed)
Patient placed on cpap/psv 5/5. Patient's RR increased to the 40's, and patient was pulling low Vt's. Patient's PS increased to 10. Tolerating well at this time

## 2012-04-05 NOTE — Progress Notes (Addendum)
ANTIBIOTIC CONSULT NOTE - FOLLOW UP  Pharmacy Consult for Levaquin Indication: HCAP  No Known Allergies  Patient Measurements: Height: 5' 10.5" (179.1 cm) Weight: 176 lb 5.9 oz (80 kg) IBW/kg (Calculated) : 74.15   Vital Signs: Temp: 102.6 F (39.2 C) (12/16 0800) Temp src: Oral (12/16 0800) BP: 130/69 mmHg (12/16 0900) Pulse Rate: 121  (12/16 0900) Intake/Output from previous day: 12/15 0701 - 12/16 0700 In: 1703.5 [I.V.:509.5; NG/GT:1140; IV Piggyback:54] Out: 6070 [Urine:6070] Intake/Output from this shift: Total I/O In: 190 [I.V.:40; NG/GT:150] Out: -   Labs:  Basename 04/05/12 0215 04/04/12 0400 04/03/12 0500  WBC 14.6* 11.8* 8.4  HGB 9.0* 8.6* 8.2*  PLT 114* 109* 96*  LABCREA -- -- --  CREATININE 1.14 1.03 1.17   Estimated Creatinine Clearance: 58.8 ml/min (by C-G formula based on Cr of 1.14). No results found for this basename: VANCOTROUGH:2,VANCOPEAK:2,VANCORANDOM:2,GENTTROUGH:2,GENTPEAK:2,GENTRANDOM:2,TOBRATROUGH:2,TOBRAPEAK:2,TOBRARND:2,AMIKACINPEAK:2,AMIKACINTROU:2,AMIKACIN:2, in the last 72 hours   Microbiology: Recent Results (from the past 720 hour(s))  URINE CULTURE     Status: Normal   Collection Time   04/01/12  2:40 PM      Component Value Range Status Comment   Specimen Description URINE, CATHETERIZED   Final    Special Requests NONE   Final    Culture  Setup Time 04/01/2012 15:34   Final    Colony Count NO GROWTH   Final    Culture NO GROWTH   Final    Report Status 04/02/2012 FINAL   Final   MRSA PCR SCREENING     Status: Normal   Collection Time   04/01/12 11:54 PM      Component Value Range Status Comment   MRSA by PCR NEGATIVE  NEGATIVE Final   CULTURE, BLOOD (ROUTINE X 2)     Status: Normal (Preliminary result)   Collection Time   04/02/12  1:18 AM      Component Value Range Status Comment   Specimen Description BLOOD CENTRAL LINE   Final    Special Requests BOTTLES DRAWN AEROBIC AND ANAEROBIC 5CC EACH   Final    Culture  Setup  Time 04/02/2012 14:26   Final    Culture     Final    Value:        BLOOD CULTURE RECEIVED NO GROWTH TO DATE CULTURE WILL BE HELD FOR 5 DAYS BEFORE ISSUING A FINAL NEGATIVE REPORT   Report Status PENDING   Incomplete   CULTURE, BLOOD (ROUTINE X 2)     Status: Normal (Preliminary result)   Collection Time   04/02/12  1:30 AM      Component Value Range Status Comment   Specimen Description BLOOD RIGHT HAND   Final    Special Requests BOTTLES DRAWN AEROBIC ONLY 1.5CC   Final    Culture  Setup Time 04/02/2012 14:26   Final    Culture     Final    Value:        BLOOD CULTURE RECEIVED NO GROWTH TO DATE CULTURE WILL BE HELD FOR 5 DAYS BEFORE ISSUING A FINAL NEGATIVE REPORT   Report Status PENDING   Incomplete   URINE CULTURE     Status: Normal   Collection Time   04/02/12  2:04 AM      Component Value Range Status Comment   Specimen Description URINE, CATHETERIZED   Final    Special Requests Normal   Final    Culture  Setup Time 04/02/2012 08:25   Final    Colony Count NO GROWTH  Final    Culture NO GROWTH   Final    Report Status 04/03/2012 FINAL   Final   CULTURE, RESPIRATORY     Status: Normal (Preliminary result)   Collection Time   04/03/12 10:17 PM      Component Value Range Status Comment   Specimen Description TRACHEAL ASPIRATE   Final    Special Requests Normal   Final    Gram Stain     Final    Value: ABUNDANT WBC PRESENT, PREDOMINANTLY PMN     ABUNDANT YEAST     FEW GRAM POSITIVE COCCI IN PAIRS   Culture PENDING   Incomplete    Report Status PENDING   Incomplete   CULTURE, BLOOD (ROUTINE X 2)     Status: Normal (Preliminary result)   Collection Time   04/04/12  4:23 PM      Component Value Range Status Comment   Specimen Description BLOOD RIGHT ARM   Final    Special Requests BOTTLES DRAWN AEROBIC AND ANAEROBIC 10CC   Final    Culture  Setup Time 04/04/2012 21:02   Final    Culture     Final    Value:        BLOOD CULTURE RECEIVED NO GROWTH TO DATE CULTURE WILL BE  HELD FOR 5 DAYS BEFORE ISSUING A FINAL NEGATIVE REPORT   Report Status PENDING   Incomplete   CULTURE, BLOOD (ROUTINE X 2)     Status: Normal (Preliminary result)   Collection Time   04/04/12  4:35 PM      Component Value Range Status Comment   Specimen Description BLOOD RIGHT HAND   Final    Special Requests BOTTLES DRAWN AEROBIC ONLY 2CC   Final    Culture  Setup Time 04/04/2012 21:02   Final    Culture     Final    Value:        BLOOD CULTURE RECEIVED NO GROWTH TO DATE CULTURE WILL BE HELD FOR 5 DAYS BEFORE ISSUING A FINAL NEGATIVE REPORT   Report Status PENDING   Incomplete     Anti-infectives     Start     Dose/Rate Route Frequency Ordered Stop   04/03/12 1800   levofloxacin (LEVAQUIN) IVPB 750 mg  Status:  Discontinued        750 mg 100 mL/hr over 90 Minutes Intravenous Every 48 hours 04/01/12 2040 04/03/12 1530   04/03/12 1800   levofloxacin (LEVAQUIN) IVPB 750 mg  Status:  Discontinued        750 mg 100 mL/hr over 90 Minutes Intravenous Every 24 hours 04/03/12 1530 04/03/12 1533   04/03/12 1800   levofloxacin (LEVAQUIN) IVPB 750 mg        750 mg 100 mL/hr over 90 Minutes Intravenous Every 24 hours 04/03/12 1533 04/10/12 1759   04/03/12 1700   vancomycin (VANCOCIN) 750 mg in sodium chloride 0.9 % 150 mL IVPB  Status:  Discontinued        750 mg 150 mL/hr over 60 Minutes Intravenous Every 12 hours 04/03/12 1530 04/03/12 1534   04/03/12 1700   vancomycin (VANCOCIN) 750 mg in sodium chloride 0.9 % 150 mL IVPB  Status:  Discontinued        750 mg 150 mL/hr over 60 Minutes Intravenous Every 12 hours 04/03/12 1534 04/04/12 1013   04/02/12 1800   vancomycin (VANCOCIN) IVPB 1000 mg/200 mL premix  Status:  Discontinued        1,000 mg 200 mL/hr  over 60 Minutes Intravenous Every 24 hours 04/01/12 2040 04/03/12 1530   04/02/12 0100   vancomycin (VANCOCIN) IVPB 1000 mg/200 mL premix  Status:  Discontinued        1,000 mg 200 mL/hr over 60 Minutes Intravenous  Once 04/02/12 0058  04/02/12 0102   04/01/12 2200   ceFEPIme (MAXIPIME) 1 g in dextrose 5 % 50 mL IVPB  Status:  Discontinued        1 g 100 mL/hr over 30 Minutes Intravenous 3 times per day 04/01/12 2031 04/01/12 2040   04/01/12 2200   ceFEPIme (MAXIPIME) 2 g in dextrose 5 % 50 mL IVPB  Status:  Discontinued        2 g 100 mL/hr over 30 Minutes Intravenous Every 24 hours 04/01/12 2040 04/04/12 1013   04/01/12 2100   levofloxacin (LEVAQUIN) IVPB 750 mg  Status:  Discontinued        750 mg 100 mL/hr over 90 Minutes Intravenous Every 24 hours 04/01/12 2031 04/01/12 2040   04/01/12 1700   ceFEPIme (MAXIPIME) 2 g in dextrose 5 % 50 mL IVPB  Status:  Discontinued        2 g 100 mL/hr over 30 Minutes Intravenous Every 24 hours 04/01/12 1552 04/01/12 2031   04/01/12 1700   levofloxacin (LEVAQUIN) IVPB 750 mg  Status:  Discontinued        750 mg 100 mL/hr over 90 Minutes Intravenous Every 48 hours 04/01/12 1552 04/01/12 2031   04/01/12 1630   vancomycin (VANCOCIN) IVPB 1000 mg/200 mL premix        1,000 mg 200 mL/hr over 60 Minutes Intravenous  Once 04/01/12 1553 04/01/12 1716   04/01/12 1545   ceFEPIme (MAXIPIME) 1 g in dextrose 5 % 50 mL IVPB  Status:  Discontinued        1 g 100 mL/hr over 30 Minutes Intravenous 3 times per day 04/01/12 1536 04/01/12 1552   04/01/12 1545   levofloxacin (LEVAQUIN) IVPB 750 mg  Status:  Discontinued        750 mg 100 mL/hr over 90 Minutes Intravenous Every 24 hours 04/01/12 1536 04/01/12 1552          Assessment: 77 yo who was admitted for PNA. He was recently admitted back in September for alcohol withdrawal issues and admitted this stay for seizures like activity leading to cardiac arrest. CXR shows right lower lobe infiltrate so vanc/cefepime/levaquin were initially ordered, narrowed 12/15 to levaquin alone.  Levaquin dose of 750 mg IV q 24 hrs is appropriate for his CrCl ~ 60 ml/min.  Cultures negative to date.  Antibiotic stop date of 12/21 in place.  Goal of  Therapy:  Resolution of infection.  Plan:  Continue levaquin to 750mg  IV daily Continue to monitor renal function.   Reece Leader, Pharm D 04/05/2012 9:49 AM

## 2012-04-05 NOTE — Progress Notes (Signed)
Nutrition Follow-up  Intervention:   1. Change TF formula to Jevity 1.2, initiate @45  ml/hr and advance by 10 ml q 4 hr to a goal rate of 60 ml/hr. Continue 30 ml Pro-stat TID. This EN regimen will provide 2028 kcal, 125 gm protein, and 1162 ml free water.  2. RD will follow output and make additional changes if pt continue with diarrhea.  3. Recommend follow Phos.  4. Will change multivitamin to liquid form.   Assessment:   Remains intubated.  Mag, Phos and Potassium dropped over weekend, repleted. Last Phos was drawn on 12/15, L, no new phos. Likely related to refeeding syndrome.   RN reports that pt is having increased diarrhea, now with rectal tube. RD will change to a fiber containing formula. RN reports pt's vitamins are tablets, RD will change multivitamin to liquid. Will leave note to Pharm to change others.  Pt now with fever, increased kcal needs.   Skin: skin tear on sacrum.   Diet Order:  NPO Patient has OG in place with tip of tube in stomach. Osmolite 1.2 is infusing @ 45 ml/hr. 30 ml Prostat via tube TID. Tube feeding regimen currently providing 1696 kcal, 105 grams protein, and 886 ml H2O.   Free water flushes: none  Residuals: none  Last bm: 12/16    Meds: Scheduled Meds:   . antiseptic oral rinse  15 mL Mouth Rinse QID  . chlorhexidine  15 mL Mouth Rinse BID  . enoxaparin  40 mg Subcutaneous Q24H  . feeding supplement (OSMOLITE 1.2 CAL)  1,000 mL Per Tube Q24H  . feeding supplement  30 mL Per Tube TID  . folic acid  1 mg Oral Daily  . insulin aspart  0-9 Units Subcutaneous Q4H  . levofloxacin (LEVAQUIN) IV  750 mg Intravenous Q24H  . multivitamin with minerals  1 tablet Oral Daily  . pantoprazole sodium  40 mg Per Tube Q24H  . thiamine  100 mg Oral Daily   Continuous Infusions:   . sodium chloride 20 mL/hr at 04/04/12 1750   PRN Meds:.acetaminophen (TYLENOL) oral liquid 160 mg/5 mL, fentaNYL, midazolam, ondansetron (ZOFRAN) IV   CMP     Component  Value Date/Time   NA 141 04/05/2012 0215   K 4.1 04/05/2012 0215   CL 105 04/05/2012 0215   CO2 28 04/05/2012 0215   GLUCOSE 175* 04/05/2012 0215   GLUCOSE 93 04/23/2006 0000   BUN 35* 04/05/2012 0215   CREATININE 1.14 04/05/2012 0215   CALCIUM 8.9 04/05/2012 0215   PROT 5.3* 04/03/2012 0500   ALBUMIN 1.9* 04/03/2012 0500   AST 66* 04/03/2012 0500   ALT 33 04/03/2012 0500   ALKPHOS 80 04/03/2012 0500   BILITOT 0.4 04/03/2012 0500   GFRNONAA 61* 04/05/2012 0215   GFRAA 71* 04/05/2012 0215   Sodium  Date/Time Value Range Status  04/05/2012  2:15 AM 141  135 - 145 mEq/L Final  04/04/2012  4:00 AM 141  135 - 145 mEq/L Final  04/03/2012  5:00 AM 141  135 - 145 mEq/L Final    Potassium  Date/Time Value Range Status  04/05/2012  2:15 AM 4.1  3.5 - 5.1 mEq/L Final  04/04/2012  4:00 AM 3.6  3.5 - 5.1 mEq/L Final  04/03/2012  5:00 AM 3.3* 3.5 - 5.1 mEq/L Final    Phosphorus  Date/Time Value Range Status  04/04/2012  4:00 AM 2.0* 2.3 - 4.6 mg/dL Final  54/12/8117  1:47 AM 1.6* 2.3 - 4.6 mg/dL Final  04/02/2012  3:22 PM 2.3  2.3 - 4.6 mg/dL Final    Magnesium  Date/Time Value Range Status  04/04/2012  4:00 AM 1.6  1.5 - 2.5 mg/dL Final  16/01/9603  5:40 AM 1.3* 1.5 - 2.5 mg/dL Final  98/02/9146  8:29 PM 1.3* 1.5 - 2.5 mg/dL Final     CBG (last 3)   Basename 04/05/12 0803 04/05/12 0410 04/05/12 0023  GLUCAP 219* 164* 183*     Intake/Output Summary (Last 24 hours) at 04/05/12 1052 Last data filed at 04/05/12 1000  Gross per 24 hour  Intake   1690 ml  Output   5845 ml  Net  -4155 ml  Patient is currently intubated on ventilator support.  MV: 10.7-15.8, ~12 Temp:Temp (24hrs), Avg:101.4 F (38.6 C), Min:99.9 F (37.7 C), Max:102.9 F (39.4 C)  Propofol: none    Weight Status:  176 lbs, variable   Re-estimated needs:  2231 kcal, 115-130 gm protein daily   Nutrition Dx:  Inadequate oral intake related to inability to eat as evidenced by NPO.    Goal:  EN to meet  >/=90% estimated nutrition needs.   --Met   Monitor:  Vent status, weight trends, labs, I/O's   Clarene Duke RD, LDN Pager [phone] After Hours pager [phone]

## 2012-04-05 NOTE — Progress Notes (Signed)
PULMONARY  / CRITICAL CARE MEDICINE  Name: Johnny [name] MRN: [id number] DOB: Aug 24, 1934    LOS: 4  REFERRING MD :  TRH  CHIEF COMPLAINT:  Alcohol withdrawal/HCAP >> Cardiac arrest  BRIEF PATIENT DESCRIPTION:  77 yo male former smoker admitted 04/01/2012 with shakes and incontinent of urine/feces and found to have pneumonia in setting of ETOH abuse/withdrawal.  Developed septic shock, and PEA on 12/13 with VDRF and 20 min before ROSC.  PCCM assumed care from 12/13.  Significant PMHx ETOH, DM type II, CAD s/p CABG, A fib, HTN, Hyperlipidemia, Low back pain   LINES / TUBES: 12/12 ETT >> 12/12 L IJ CVL >> 12/13 Lt radial Aline>>12/14  CULTURES: 12/12 resp >>GPC in pairs and candida albicans. 12/12 blood >>NTD 12/12 urine >>negative 12/12 influenza >negative 12/12 urine strep >> negative 12/12 urine leg >>negative  ANTIBIOTICS: 12/12 Vanc (HCAP) >>12/15 12/12 Cefepime (HCAP) >>12/15 12/12 Levaqin (HCAP) >>  SIGNIFICANT EVENTS:  12/12 admission and PEA arrest 12/14 Off pressors  TESTS: 12/12 CT head >> chronic microvascular ischemic changes 12/13 EEG >> moderate-to-severe generalized continuous slowing of cerebral activity 12/13 Echo >> EF 50 to 55% (tested while on dobutamine) 12/13 Abd u/s >> hepatic steatosis, medical renal disease, gallbladder sludge with small stones  LEVEL OF CARE:  ICU PRIMARY SERVICE:  PCCM CONSULTANTS:  None CODE STATUS: full  DIET:  Tube feeds DVT Px:  Lovenox GI Px:  Pantoprazole   INTERVAL HISTORY:  Open's eyes with stimulation.  Did not tolerate pressure support.  VITAL SIGNS: Temp:  [99.9 F (37.7 C)-102.9 F (39.4 C)] 102.6 F (39.2 C) (12/16 0800) Pulse Rate:  [27-144] 108  (12/16 1100) Resp:  [15-35] 21  (12/16 1100) BP: (105-182)/(50-100) 105/50 mmHg (12/16 1100) SpO2:  [91 %-100 %] 99 % (12/16 1100) FiO2 (%):  [29.7 %-30.2 %] 30 % (12/16 0954) Weight:  [80 kg (176 lb 5.9 oz)] 80 kg (176 lb 5.9 oz) (12/16  0500) HEMODYNAMICS: CVP:  [0 mmHg-4 mmHg] 0 mmHg VENTILATOR SETTINGS: Vent Mode:  [-] PRVC FiO2 (%):  [29.7 %-30.2 %] 30 % Set Rate:  [14 bmp] 14 bmp Vt Set:  [480 mL] 480 mL PEEP:  [4.5 cmH20-5 cmH20] 5 cmH20 Pressure Support:  [10 cmH20] 10 cmH20 Plateau Pressure:  [15 cmH20-35 cmH20] 15 cmH20 INTAKE / OUTPUT: Intake/Output      12/15 0701 - 12/16 0700 12/16 0701 - 12/17 0700   I.V. (mL/kg) 509.5 (6.4) 80 (1)   NG/GT 1140 240   IV Piggyback 54    Total Intake(mL/kg) 1703.5 (21.3) 320 (4)   Urine (mL/kg/hr) 6070 (3.2)    Total Output 6070    Net -4366.5 +320        Stool Occurrence 3 x      PHYSICAL EXAMINATION: Gen: No distress HEENT: ETT in place PULM: Decreased breath sounds, no wheeze CV: s1s2 regular, no murmur AB: decreased bowel sounds, soft, non tender Ext: no edema Derm: excoriations around sacral region Neuro: open's eyes with stimulation, not following commands  LABS: Cbc  Lab 04/05/12 0215 04/04/12 0400 04/03/12 0500  WBC 14.6* -- --  HGB 9.0* 8.6* 8.2*  HCT 26.0* 24.5* 22.6*  PLT 114* 109* 96*   Chemistry  Lab 04/05/12 0215 04/04/12 0400 04/03/12 0500 04/02/12 1522  NA 141 141 141 --  K 4.1 3.6 3.3* --  CL 105 109 110 --  CO2 28 24 21  --  BUN 35* 30* 34* --  CREATININE 1.14 1.03 1.17 --  CALCIUM  8.9 8.8 8.6 --  MG -- 1.6 1.3* 1.3*  PHOS -- 2.0* 1.6* 2.3  GLUCOSE 175* 167* 157* --   Liver fxn  Lab 04/03/12 0500 04/02/12 0118 04/01/12 1400  AST 66* 236* 117*  ALT 33 61* 31  ALKPHOS 80 103 138*  BILITOT 0.4 1.2 1.6*  PROT 5.3* 6.4 8.1  ALBUMIN 1.9* 2.6* 3.4*   coags  Lab 04/02/12 0118  APTT 39*  INR 1.57*   Sepsis markers  Lab 04/02/12 0130  LATICACIDVEN 6.6*  PROCALCITON 18.02   Cardiac markers  Lab 04/02/12 1522 04/02/12 0610 04/02/12 0010  CKTOTAL -- -- --  CKMB -- -- --  TROPONINI 0.34* 0.54* <0.30   BNP No results found for this basename: PROBNP:3 in the last 168 hours ABG  Lab 04/03/12 0430 04/02/12 0420  04/02/12 0059  PHART 7.392 7.405 7.543*  PCO2ART 34.0* 32.6* 30.5*  PO2ART 128.0* 136.0* 312.0*  HCO3 20.7 20.5 26.3*  TCO2 22 21 27     CBG trend  Lab 04/05/12 0803 04/05/12 0410 04/05/12 0023 04/04/12 1947 04/04/12 1627  GLUCAP 219* 164* 183* 227* 293*    IMAGING:  Dg Chest Port 1 View  04/05/2012  *RADIOLOGY REPORT*  Clinical Data: Assess pneumonia  PORTABLE CHEST - 1 VIEW  Comparison: Yesterday  Findings: Right lower lobe pneumonia not significantly changed. Endotracheal tube and NG tube stable.  Left internal jugular vein central venous catheters stable.  No pneumothorax.  IMPRESSION: Stable right lower lobe pneumonia.   Original Report Authenticated By: Jolaine Click, M.D.    Dg Chest Port 1 View  04/04/2012  *RADIOLOGY REPORT*  Clinical Data:  pneumonia  PORTABLE CHEST - 1 VIEW  Comparison: Chest radiograph 04/03/2012  Findings: Endotracheal tube and central venous line unchanged.  NG tube noted.  Normal heart silhouette.  There is air space opacity in the right lower lobe suggesting pneumonia.  No effusion or pneumothorax.  IMPRESSION: No change in the right lower lobe pneumonia pattern.  Stable support apparatus.   Original Report Authenticated By: Genevive Bi, M.D.     DIAGNOSES: Principal Problem:  *HCAP (healthcare-associated pneumonia) Active Problems:  DIABETES MELLITUS, TYPE II  HYPERTENSION  Alcohol dependence  ARF (acute renal failure)  Volume depletion  Hyponatremia  Acute respiratory failure with hypoxia  Cardiac arrest  Septic shock(785.52)  Encephalopathy  ASSESSMENT / PLAN:  PULMONARY  ASSESSMENT: Acute respiratory failure in setting of PNA and cardiac arrest with hx of smoking. PLAN:   PS ok today but requiring high pressures. F/u CXR and ABG. SBT in AM. Aggressive diureses today.  CARDIOVASCULAR  ASSESSMENT:  PEA. Likely related to pneumonia, acidosis, hypoxic respiratory failure in setting of ETOH withdrawal. Septic shock 2nd to  PNA. Resolved 12/14. Hypervolemia. PLAN:  - Per cards.  RENAL  ASSESSMENT:   Acute renal failure 2nd to shock. Baseline creatinine 0.95 from 01/05/12.  Resolved 12/15. Lactic acidosis related to shock and liver dysfx. Resolved. Hypokalemia, hypomagnesemia, hypophosphatemia. PLAN:   - Monitor renal fx, urine outpt - Lasix 40 mg IV q6h x 3 doses with zaroxolyn. - K, Phos and Mg replacement.  GASTROINTESTINAL  ASSESSMENT:   Protein calorie malnutrition with hx of ETOH. Fatty liver 2nd to ETOH. PLAN:   - Tube feeds while on vent. - Thiamine, folic acid, MVI.  HEMATOLOGIC  ASSESSMENT:   Anemia, thrombocytopenia likely related to ETOH and chronic disease with critical illness. PLAN:  - F/u CBC. - Transfuse for Hb < 7.  INFECTIOUS  ASSESSMENT:   Pneumonia  PLAN:   D5/x levaquin D/c vancomycin/ cefepime 12/15 and continue monitor.  ENDOCRINE  ASSESSMENT:   DM type II. PLAN:   SSI  NEUROLOGIC  ASSESSMENT:   EtOH withdrawal. Acute encephalopathy 2nd to sepsis, hypoxia, and cardiac arrest with ?anoxic injury.  PLAN:   Change to fentanyl and precedex. May need further neuro evaluation depending on progress  Critical care time 35 minutes  Alyson Reedy, M.D. [facility] Pulmonary/Critical Care Medicine. Pager: [phone]. After hours pager: [phone].

## 2012-04-06 ENCOUNTER — Inpatient Hospital Stay (HOSPITAL_COMMUNITY): Payer: Medicare PPO

## 2012-04-06 DIAGNOSIS — R6510 Systemic inflammatory response syndrome (SIRS) of non-infectious origin without acute organ dysfunction: Secondary | ICD-10-CM

## 2012-04-06 LAB — GLUCOSE, CAPILLARY
Glucose-Capillary: 223 mg/dL — ABNORMAL HIGH (ref 70–99)
Glucose-Capillary: 269 mg/dL — ABNORMAL HIGH (ref 70–99)
Glucose-Capillary: 365 mg/dL — ABNORMAL HIGH (ref 70–99)

## 2012-04-06 LAB — BLOOD GAS, ARTERIAL
Bicarbonate: 31.2 mEq/L — ABNORMAL HIGH (ref 20.0–24.0)
Drawn by: 31101
PEEP: 5 cmH2O
RATE: 14 resp/min
pH, Arterial: 7.54 — ABNORMAL HIGH (ref 7.350–7.450)
pO2, Arterial: 93.3 mmHg (ref 80.0–100.0)

## 2012-04-06 LAB — CBC
MCH: 32.2 pg (ref 26.0–34.0)
MCHC: 33.7 g/dL (ref 30.0–36.0)
MCV: 95.6 fL (ref 78.0–100.0)
Platelets: 151 10*3/uL (ref 150–400)
RDW: 16.2 % — ABNORMAL HIGH (ref 11.5–15.5)
WBC: 14.9 10*3/uL — ABNORMAL HIGH (ref 4.0–10.5)

## 2012-04-06 LAB — BASIC METABOLIC PANEL
Calcium: 9 mg/dL (ref 8.4–10.5)
Creatinine, Ser: 1.13 mg/dL (ref 0.50–1.35)
GFR calc Af Amer: 71 mL/min — ABNORMAL LOW (ref >90)
GFR calc non Af Amer: 62 mL/min — ABNORMAL LOW (ref >90)

## 2012-04-06 LAB — CULTURE, RESPIRATORY W GRAM STAIN: Special Requests: NORMAL

## 2012-04-06 LAB — MAGNESIUM: Magnesium: 1.5 mg/dL (ref 1.5–2.5)

## 2012-04-06 MED ORDER — VANCOMYCIN HCL 1000 MG IV SOLR
750.0000 mg | Freq: Two times a day (BID) | INTRAVENOUS | Status: DC
  Administered 2012-04-06 – 2012-04-07 (×2): 750 mg via INTRAVENOUS
  Filled 2012-04-06 (×3): qty 750

## 2012-04-06 MED ORDER — INSULIN ASPART 100 UNIT/ML ~~LOC~~ SOLN
0.0000 [IU] | SUBCUTANEOUS | Status: DC
  Administered 2012-04-06: 8 [IU] via SUBCUTANEOUS
  Administered 2012-04-07 (×2): 3 [IU] via SUBCUTANEOUS
  Administered 2012-04-07: 2 [IU] via SUBCUTANEOUS
  Administered 2012-04-07 (×2): 3 [IU] via SUBCUTANEOUS
  Administered 2012-04-07: 5 [IU] via SUBCUTANEOUS
  Administered 2012-04-08: 3 [IU] via SUBCUTANEOUS
  Administered 2012-04-08: 8 [IU] via SUBCUTANEOUS
  Administered 2012-04-08: 2 [IU] via SUBCUTANEOUS
  Administered 2012-04-08: 5 [IU] via SUBCUTANEOUS
  Administered 2012-04-08: 11 [IU] via SUBCUTANEOUS
  Administered 2012-04-09: 2 [IU] via SUBCUTANEOUS
  Administered 2012-04-09: 8 [IU] via SUBCUTANEOUS
  Administered 2012-04-09 (×3): 5 [IU] via SUBCUTANEOUS
  Administered 2012-04-10: 2 [IU] via SUBCUTANEOUS
  Administered 2012-04-10: 5 [IU] via SUBCUTANEOUS
  Administered 2012-04-10: 8 [IU] via SUBCUTANEOUS
  Administered 2012-04-10: 5 [IU] via SUBCUTANEOUS
  Administered 2012-04-10 – 2012-04-11 (×2): 2 [IU] via SUBCUTANEOUS
  Administered 2012-04-11: 8 [IU] via SUBCUTANEOUS
  Administered 2012-04-11: 5 [IU] via SUBCUTANEOUS
  Administered 2012-04-11: 3 [IU] via SUBCUTANEOUS
  Administered 2012-04-11: 8 [IU] via SUBCUTANEOUS
  Administered 2012-04-12 (×2): 3 [IU] via SUBCUTANEOUS
  Administered 2012-04-12: 5 [IU] via SUBCUTANEOUS
  Administered 2012-04-12 (×2): 2 [IU] via SUBCUTANEOUS
  Administered 2012-04-12 – 2012-04-13 (×3): 3 [IU] via SUBCUTANEOUS
  Administered 2012-04-13: 5 [IU] via SUBCUTANEOUS
  Administered 2012-04-13 (×2): 3 [IU] via SUBCUTANEOUS
  Administered 2012-04-14: 5 [IU] via SUBCUTANEOUS
  Administered 2012-04-14: 3 [IU] via SUBCUTANEOUS
  Administered 2012-04-14: 5 [IU] via SUBCUTANEOUS
  Administered 2012-04-14: 8 [IU] via SUBCUTANEOUS
  Administered 2012-04-14: 5 [IU] via SUBCUTANEOUS
  Administered 2012-04-15: 8 [IU] via SUBCUTANEOUS
  Administered 2012-04-15 (×4): 5 [IU] via SUBCUTANEOUS
  Administered 2012-04-15: 3 [IU] via SUBCUTANEOUS
  Administered 2012-04-16: 8 [IU] via SUBCUTANEOUS
  Administered 2012-04-16 (×2): 5 [IU] via SUBCUTANEOUS
  Administered 2012-04-16: 2 [IU] via SUBCUTANEOUS
  Administered 2012-04-16 – 2012-04-17 (×2): 3 [IU] via SUBCUTANEOUS
  Administered 2012-04-17: 2 [IU] via SUBCUTANEOUS
  Administered 2012-04-17 (×2): 5 [IU] via SUBCUTANEOUS
  Administered 2012-04-17 (×2): 3 [IU] via SUBCUTANEOUS
  Administered 2012-04-18: 2 [IU] via SUBCUTANEOUS
  Administered 2012-04-18 (×3): 3 [IU] via SUBCUTANEOUS
  Administered 2012-04-18: 2 [IU] via SUBCUTANEOUS
  Administered 2012-04-19: 3 [IU] via SUBCUTANEOUS
  Administered 2012-04-19: 8 [IU] via SUBCUTANEOUS
  Administered 2012-04-19: 3 [IU] via SUBCUTANEOUS
  Administered 2012-04-19: 5 [IU] via SUBCUTANEOUS
  Administered 2012-04-19 – 2012-04-20 (×3): 3 [IU] via SUBCUTANEOUS
  Administered 2012-04-20: 11 [IU] via SUBCUTANEOUS
  Administered 2012-04-20: 5 [IU] via SUBCUTANEOUS
  Administered 2012-04-21: 3 [IU] via SUBCUTANEOUS
  Administered 2012-04-21: 2 [IU] via SUBCUTANEOUS
  Administered 2012-04-21: 3 [IU] via SUBCUTANEOUS
  Administered 2012-04-21: 5 [IU] via SUBCUTANEOUS
  Administered 2012-04-21: 2 [IU] via SUBCUTANEOUS
  Administered 2012-04-21: 5 [IU] via SUBCUTANEOUS
  Administered 2012-04-22: 3 [IU] via SUBCUTANEOUS
  Administered 2012-04-22: 5 [IU] via SUBCUTANEOUS
  Administered 2012-04-22 (×2): 3 [IU] via SUBCUTANEOUS
  Administered 2012-04-22: 5 [IU] via SUBCUTANEOUS
  Administered 2012-04-22 – 2012-04-23 (×2): 2 [IU] via SUBCUTANEOUS
  Administered 2012-04-23: 3 [IU] via SUBCUTANEOUS
  Administered 2012-04-23: 5 [IU] via SUBCUTANEOUS
  Administered 2012-04-23: 3 [IU] via SUBCUTANEOUS
  Administered 2012-04-23: 5 [IU] via SUBCUTANEOUS
  Administered 2012-04-23 (×2): 3 [IU] via SUBCUTANEOUS
  Administered 2012-04-24: 5 [IU] via SUBCUTANEOUS
  Administered 2012-04-24 (×2): 3 [IU] via SUBCUTANEOUS
  Administered 2012-04-24 – 2012-04-25 (×3): 5 [IU] via SUBCUTANEOUS
  Administered 2012-04-25: 3 [IU] via SUBCUTANEOUS
  Administered 2012-04-25: 2 [IU] via SUBCUTANEOUS
  Administered 2012-04-25 – 2012-04-27 (×8): 3 [IU] via SUBCUTANEOUS
  Administered 2012-04-27 (×3): 2 [IU] via SUBCUTANEOUS
  Administered 2012-04-27 – 2012-04-28 (×2): 3 [IU] via SUBCUTANEOUS
  Administered 2012-04-28 (×2): 2 [IU] via SUBCUTANEOUS
  Administered 2012-04-28: 3 [IU] via SUBCUTANEOUS
  Administered 2012-04-29 (×2): 2 [IU] via SUBCUTANEOUS
  Administered 2012-04-30: 3 [IU] via SUBCUTANEOUS
  Administered 2012-04-30: 2 [IU] via SUBCUTANEOUS
  Administered 2012-05-01: 21:00:00 via SUBCUTANEOUS
  Administered 2012-05-01 (×2): 2 [IU] via SUBCUTANEOUS
  Administered 2012-05-01: 3 [IU] via SUBCUTANEOUS
  Administered 2012-05-02 – 2012-05-03 (×4): 2 [IU] via SUBCUTANEOUS
  Administered 2012-05-03 – 2012-05-04 (×3): 3 [IU] via SUBCUTANEOUS
  Administered 2012-05-04 – 2012-05-05 (×3): 2 [IU] via SUBCUTANEOUS
  Administered 2012-05-05 – 2012-05-06 (×7): 3 [IU] via SUBCUTANEOUS
  Administered 2012-05-06: 2 [IU] via SUBCUTANEOUS
  Administered 2012-05-06: 3 [IU] via SUBCUTANEOUS
  Administered 2012-05-07 (×3): 2 [IU] via SUBCUTANEOUS

## 2012-04-06 MED ORDER — POTASSIUM CHLORIDE 20 MEQ/15ML (10%) PO LIQD
40.0000 meq | Freq: Once | ORAL | Status: AC
  Administered 2012-04-06: 40 meq
  Filled 2012-04-06: qty 30

## 2012-04-06 MED ORDER — POTASSIUM CHLORIDE 20 MEQ/15ML (10%) PO LIQD
40.0000 meq | Freq: Three times a day (TID) | ORAL | Status: AC
  Administered 2012-04-06 (×2): 40 meq
  Filled 2012-04-06 (×2): qty 30

## 2012-04-06 MED ORDER — VANCOMYCIN HCL 10 G IV SOLR
1500.0000 mg | Freq: Once | INTRAVENOUS | Status: AC
  Administered 2012-04-06: 1500 mg via INTRAVENOUS
  Filled 2012-04-06: qty 1500

## 2012-04-06 MED ORDER — FUROSEMIDE 10 MG/ML IJ SOLN
40.0000 mg | Freq: Four times a day (QID) | INTRAMUSCULAR | Status: AC
  Administered 2012-04-06 (×3): 40 mg via INTRAVENOUS
  Filled 2012-04-06 (×3): qty 4

## 2012-04-06 MED ORDER — POTASSIUM CHLORIDE 20 MEQ/15ML (10%) PO LIQD
ORAL | Status: AC
  Administered 2012-04-06: 40 meq
  Filled 2012-04-06: qty 60

## 2012-04-06 MED ORDER — MAGNESIUM SULFATE 40 MG/ML IJ SOLN
2.0000 g | Freq: Once | INTRAMUSCULAR | Status: AC
  Administered 2012-04-06: 2 g via INTRAVENOUS
  Filled 2012-04-06: qty 50

## 2012-04-06 NOTE — Progress Notes (Signed)
ANTIBIOTIC CONSULT NOTE - FOLLOW UP  Pharmacy Consult for Vancomycin Indication: bacteremia  No Known Allergies  Patient Measurements: Height: 5' 10.5" (179.1 cm) Weight: 169 lb 5 oz (76.8 kg) IBW/kg (Calculated) : 74.15   Vital Signs: Temp: 99.2 F (37.3 C) (12/17 0400) Temp src: Oral (12/17 0400) BP: 124/63 mmHg (12/17 0400) Pulse Rate: 78  (12/17 0400) Intake/Output from previous day: 12/16 0701 - 12/17 0700 In: 2174 [I.V.:576; NG/GT:1340; IV Piggyback:258] Out: 4960 [Urine:4660; Stool:300] Intake/Output from this shift: Total I/O In: 966 [I.V.:288; NG/GT:620; IV Piggyback:58] Out: 2600 [Urine:2600]  Labs:  [facility] 04/05/12 0215 04/04/12 0400 04/03/12 0500  WBC 14.6* 11.8* 8.4  HGB 9.0* 8.6* 8.2*  PLT 114* 109* 96*  LABCREA -- -- --  CREATININE 1.14 1.03 1.17   Estimated Creatinine Clearance: 58.8 ml/min (by C-G formula based on Cr of 1.14). No results found for this basename: VANCOTROUGH:2,VANCOPEAK:2,VANCORANDOM:2,GENTTROUGH:2,GENTPEAK:2,GENTRANDOM:2,TOBRATROUGH:2,TOBRAPEAK:2,TOBRARND:2,AMIKACINPEAK:2,AMIKACINTROU:2,AMIKACIN:2, in the last 72 hours   Microbiology: Recent Results (from the past 720 hour(s))  URINE CULTURE     Status: Normal   Collection Time   04/01/12  2:40 PM      Component Value Range Status Comment   Specimen Description URINE, CATHETERIZED   Final    Special Requests NONE   Final    Culture  Setup Time 04/01/2012 15:34   Final    Colony Count NO GROWTH   Final    Culture NO GROWTH   Final    Report Status 04/02/2012 FINAL   Final   MRSA PCR SCREENING     Status: Normal   Collection Time   04/01/12 11:54 PM      Component Value Range Status Comment   MRSA by PCR NEGATIVE  NEGATIVE Final   CULTURE, BLOOD (ROUTINE X 2)     Status: Normal (Preliminary result)   Collection Time   04/02/12  1:18 AM      Component Value Range Status Comment   Specimen Description BLOOD CENTRAL LINE   Final    Special Requests BOTTLES DRAWN AEROBIC AND  ANAEROBIC 5CC EACH   Final    Culture  Setup Time 04/02/2012 14:26   Final    Culture     Final    Value:        BLOOD CULTURE RECEIVED NO GROWTH TO DATE CULTURE WILL BE HELD FOR 5 DAYS BEFORE ISSUING A FINAL NEGATIVE REPORT   Report Status PENDING   Incomplete   CULTURE, BLOOD (ROUTINE X 2)     Status: Normal (Preliminary result)   Collection Time   04/02/12  1:30 AM      Component Value Range Status Comment   Specimen Description BLOOD RIGHT HAND   Final    Special Requests BOTTLES DRAWN AEROBIC ONLY 1.5CC   Final    Culture  Setup Time 04/02/2012 14:26   Final    Culture     Final    Value:        BLOOD CULTURE RECEIVED NO GROWTH TO DATE CULTURE WILL BE HELD FOR 5 DAYS BEFORE ISSUING A FINAL NEGATIVE REPORT   Report Status PENDING   Incomplete   URINE CULTURE     Status: Normal   Collection Time   04/02/12  2:04 AM      Component Value Range Status Comment   Specimen Description URINE, CATHETERIZED   Final    Special Requests Normal   Final    Culture  Setup Time 04/02/2012 08:25   Final    Colony Count  NO GROWTH   Final    Culture NO GROWTH   Final    Report Status 04/03/2012 FINAL   Final   CULTURE, RESPIRATORY     Status: Normal (Preliminary result)   Collection Time   04/03/12 10:17 PM      Component Value Range Status Comment   Specimen Description TRACHEAL ASPIRATE   Final    Special Requests Normal   Final    Gram Stain     Final    Value: ABUNDANT WBC PRESENT, PREDOMINANTLY PMN     ABUNDANT YEAST     FEW GRAM POSITIVE COCCI IN PAIRS   Culture ABUNDANT CANDIDA ALBICANS   Final    Report Status PENDING   Incomplete   CULTURE, BLOOD (ROUTINE X 2)     Status: Normal (Preliminary result)   Collection Time   04/04/12  4:23 PM      Component Value Range Status Comment   Specimen Description BLOOD RIGHT ARM   Final    Special Requests BOTTLES DRAWN AEROBIC AND ANAEROBIC 10CC   Final    Culture  Setup Time 04/04/2012 21:02   Final    Culture     Final    Value:         BLOOD CULTURE RECEIVED NO GROWTH TO DATE CULTURE WILL BE HELD FOR 5 DAYS BEFORE ISSUING A FINAL NEGATIVE REPORT   Report Status PENDING   Incomplete   CULTURE, BLOOD (ROUTINE X 2)     Status: Normal (Preliminary result)   Collection Time   04/04/12  4:35 PM      Component Value Range Status Comment   Specimen Description BLOOD RIGHT HAND   Final    Special Requests BOTTLES DRAWN AEROBIC ONLY 2CC   Final    Culture  Setup Time 04/04/2012 21:02   Final    Culture     Final    Value: GRAM POSITIVE COCCI IN CLUSTERS     Note: Gram Stain Report Called to,Read Back By and Verified With: SHANNA STOWE @0420AM  ON 04/06/12 BY MCLET   Report Status PENDING   Incomplete    Assessment: 76 yo with GPC in clusters in 1/2 blood cultures for empiric vancomycin.  Previously on vancomycin for HCAP, last dose 12/15 0500.  Goal of Therapy:  Vancomycin trough level 15-20 mcg/ml  Plan:  Vancomycin 1500 mg IV now, then 750 mg IV q12h  Eddie Candle 04/06/2012,4:34 AM

## 2012-04-06 NOTE — Progress Notes (Signed)
eLink Physician-Brief Progress Note Patient Name: Johnny [name] DOB: 02/06/1935 MRN: [id number]  Date of Service  04/06/2012   HPI/Events of Note  Call from nurse reporting blood culture for 12/15 positive for GM + in clusters.  Vanc/Cefepime d/ced and patient only on levaquin.  WBC has increased on no steroids   eICU Interventions  Plan: Restart vancomycin Continue Levaquin   Intervention Category Intermediate Interventions: Infection - evaluation and management  DETERDING,ELIZABETH 04/06/2012, 4:27 AM

## 2012-04-06 NOTE — Progress Notes (Signed)
CRITICAL VALUE ALERT  Critical value received:  Gram pos cocci clusters in aerobic bottle  Date of notification:  04/06/12  Time of notification:  0420  Critical value read back:yes  Nurse who received alert:  Madalyn Rob    MD notified (1st page):  Dr. Darrick Penna  Time of first page:  0423  MD notified (2nd page):  Time of second page:  Responding MD:  Dr. Darrick Penna  Time MD responded:  [phone]

## 2012-04-06 NOTE — Progress Notes (Signed)
eLink Physician-Brief Progress Note Patient Name: Johnny [name] DOB: Apr 29, 1934 MRN: [id number]  Date of Service  04/06/2012   HPI/Events of Note  hypokalemia   eICU Interventions  Potassium replaced   Intervention Category Minor Interventions: Electrolytes abnormality - evaluation and management  DETERDING,ELIZABETH 04/06/2012, 6:14 AM

## 2012-04-06 NOTE — Progress Notes (Signed)
Microbiology reviewed   The patient with GPC in tracheal aspirate and GPC in blood 1/2; currently on vanc/levaquin. Cont current antibiotics; adjust per final cultures and sensitivities.

## 2012-04-06 NOTE — Progress Notes (Signed)
PULMONARY  / CRITICAL CARE MEDICINE  Name: Johnny [name] MRN: [id number] DOB: 1935/01/08    LOS: 5  REFERRING MD :  TRH  CHIEF COMPLAINT:  Alcohol withdrawal/HCAP >> Cardiac arrest  BRIEF PATIENT DESCRIPTION:  77 yo male former smoker admitted 04/01/2012 with shakes and incontinent of urine/feces and found to have pneumonia in setting of ETOH abuse/withdrawal.  Developed septic shock, and PEA on 12/13 with VDRF and 20 min before ROSC.  PCCM assumed care from 12/13.  Significant PMHx ETOH, DM type II, CAD s/p CABG, A fib, HTN, Hyperlipidemia, Low back pain   LINES / TUBES: 12/12 ETT >> 12/12 L IJ CVL >> 12/13 Lt radial Aline>>12/14  CULTURES: 12/12 resp >>GPC in pairs and candida albicans. 12/12 blood >>NTD 12/12 urine >>negative 12/12 influenza >negative 12/12 urine strep >> negative 12/12 urine leg >>negative  ANTIBIOTICS: 12/12 Vanc (HCAP) >>12/15 12/12 Cefepime (HCAP) >>12/15 12/12 Levaqin (HCAP) >>  SIGNIFICANT EVENTS:  12/12 admission and PEA arrest 12/14 Off pressors  TESTS: 12/12 CT head >> chronic microvascular ischemic changes 12/13 EEG >> moderate-to-severe generalized continuous slowing of cerebral activity 12/13 Echo >> EF 50 to 55% (tested while on dobutamine) 12/13 Abd u/s >> hepatic steatosis, medical renal disease, gallbladder sludge with small stones  LEVEL OF CARE:  ICU PRIMARY SERVICE:  PCCM CONSULTANTS:  None CODE STATUS: full  DIET:  Tube feeds DVT Px:  Lovenox GI Px:  Pantoprazole   INTERVAL HISTORY:  Open's eyes with stimulation.  Did not tolerate pressure support.  VITAL SIGNS: Temp:  [99.1 F (37.3 C)-100.5 F (38.1 C)] 99.2 F (37.3 C) (12/17 0400) Pulse Rate:  [73-129] 84  (12/17 0820) Resp:  [13-32] 27  (12/17 0820) BP: (105-166)/(50-85) 112/56 mmHg (12/17 0820) SpO2:  [98 %-100 %] 99 % (12/17 0820) FiO2 (%):  [29.9 %-30.2 %] 30 % (12/17 0820) Weight:  [76.8 kg (169 lb 5 oz)] 76.8 kg (169 lb 5 oz) (12/17  0400) HEMODYNAMICS: CVP:  [2 mmHg-7 mmHg] 2 mmHg VENTILATOR SETTINGS: Vent Mode:  [-] CPAP FiO2 (%):  [29.9 %-30.2 %] 30 % Set Rate:  [14 bmp] 14 bmp Vt Set:  [480 mL] 480 mL PEEP:  [5 cmH20] 5 cmH20 Pressure Support:  [10 cmH20] 10 cmH20 Plateau Pressure:  [15 cmH20-18 cmH20] 18 cmH20 INTAKE / OUTPUT: Intake/Output      12/16 0701 - 12/17 0700 12/17 0701 - 12/18 0700   I.V. (mL/kg) 614.9 (8)    NG/GT 1400    IV Piggyback 758    Total Intake(mL/kg) 2772.9 (36.1)    Urine (mL/kg/hr) 4660 (2.5)    Stool 300    Total Output 4960    Net -2187.1          PHYSICAL EXAMINATION: Gen: No distress, more alert and interactive. HEENT: ETT in place, very poor dentition. PULM: Decreased breath sounds, no wheeze CV: s1s2 regular, no murmur AB: decreased bowel sounds, soft, non tender Ext: no edema Derm: excoriations around sacral region Neuro: open's eyes with stimulation, not following commands  LABS: Cbc  Lab 04/06/12 0500 04/05/12 0215 04/04/12 0400  WBC 14.9* -- --  HGB 9.5* 9.0* 8.6*  HCT 28.2* 26.0* 24.5*  PLT 151 114* 109*   Chemistry  Lab 04/06/12 0500 04/05/12 0215 04/04/12 0400 04/03/12 0500  NA 141 141 141 --  K 3.3* 4.1 3.6 --  CL 99 105 109 --  CO2 32 28 24 --  BUN 39* 35* 30* --  CREATININE 1.13 1.14 1.03 --  CALCIUM 9.0 8.9 8.8 --  MG 1.5 -- 1.6 1.3*  PHOS 2.8 -- 2.0* 1.6*  GLUCOSE 282* 175* 167* --   Liver fxn  Lab 04/03/12 0500 04/02/12 0118 04/01/12 1400  AST 66* 236* 117*  ALT 33 61* 31  ALKPHOS 80 103 138*  BILITOT 0.4 1.2 1.6*  PROT 5.3* 6.4 8.1  ALBUMIN 1.9* 2.6* 3.4*   coags  Lab 04/02/12 0118  APTT 39*  INR 1.57*   Sepsis markers  Lab 04/02/12 0130  LATICACIDVEN 6.6*  PROCALCITON 18.02   Cardiac markers  Lab 04/02/12 1522 04/02/12 0610 04/02/12 0010  CKTOTAL -- -- --  CKMB -- -- --  TROPONINI 0.34* 0.54* <0.30   BNP No results found for this basename: PROBNP:3 in the last 168 hours ABG  Lab 04/06/12 0500 04/03/12 0430  04/02/12 0420  PHART 7.540* 7.392 7.405  PCO2ART 36.6 34.0* 32.6*  PO2ART 93.3 128.0* 136.0*  HCO3 31.2* 20.7 20.5  TCO2 32.4 22 21     CBG trend  Lab 04/06/12 0821 04/06/12 0359 04/06/12 0034 04/05/12 2035 04/05/12 1614  GLUCAP 180* 409*811* 269* 276* 235*    IMAGING:  Dg Chest Port 1 View  04/06/2012  *RADIOLOGY REPORT*  Clinical Data: Evaluate endotracheal tube position.  PORTABLE CHEST - 1 VIEW  Comparison: 04/05/2012.  Findings: The support apparatus is stable.  The cardiac silhouette, mediastinal and hilar contours are unchanged.  There is a persistent right lower lobe infiltrate.  No pulmonary edema or pleural effusion.  IMPRESSION:  1.  Stable support apparatus. 2.  Persistent right lower lobe infiltrate.   Original Report Authenticated By: Rudie Meyer, M.D.    Dg Chest Port 1 View  04/05/2012  *RADIOLOGY REPORT*  Clinical Data: Assess pneumonia  PORTABLE CHEST - 1 VIEW  Comparison: Yesterday  Findings: Right lower lobe pneumonia not significantly changed. Endotracheal tube and NG tube stable.  Left internal jugular vein central venous catheters stable.  No pneumothorax.  IMPRESSION: Stable right lower lobe pneumonia.   Original Report Authenticated By: Jolaine Click, M.D.     DIAGNOSES: Principal Problem:  *HCAP (healthcare-associated pneumonia) Active Problems:  DIABETES MELLITUS, TYPE II  HYPERTENSION  Alcohol dependence  ARF (acute renal failure)  Volume depletion  Hyponatremia  Acute respiratory failure with hypoxia  Cardiac arrest  Septic shock(785.52)  Encephalopathy  ASSESSMENT / PLAN:  PULMONARY  ASSESSMENT: Acute respiratory failure in setting of PNA and cardiac arrest with hx of smoking. PLAN:   Continue PS trials, secretions are too much to truly extubate. F/u CXR and ABG. SBT in AM. Aggressive diureses again today.  CARDIOVASCULAR  ASSESSMENT:  PEA. Likely related to pneumonia, acidosis, hypoxic respiratory failure in setting of ETOH  withdrawal. Septic shock 2nd to PNA. Resolved 12/14. Hypervolemia. PLAN:  - Per cards.  RENAL  ASSESSMENT:   Acute renal failure 2nd to shock. Baseline creatinine 0.95 from 01/05/12.  Resolved 12/15. Lactic acidosis related to shock and liver dysfx. Resolved. Hypokalemia, hypomagnesemia, hypophosphatemia.  Intake/Output Summary (Last 24 hours) at 04/06/12 0943 Last data filed at 04/06/12 0513  Gross per 24 hour  Intake 2582.93 ml  Output   4740 ml  Net -2157.07 ml   PLAN:   - Monitor renal fx, urine outpt. - Lasix 40 mg IV q6h x 3 doses with zaroxolyn. - K, Phos and Mg replacement.  GASTROINTESTINAL  ASSESSMENT:   Protein calorie malnutrition with hx of ETOH. Fatty liver 2nd to ETOH. PLAN:   - Tube feeds while on vent. - Thiamine,  folic acid, MVI.  HEMATOLOGIC  ASSESSMENT:   Anemia, thrombocytopenia likely related to ETOH and chronic disease with critical illness. PLAN:  - F/u CBC. - Transfuse for Hb < 7.  INFECTIOUS  ASSESSMENT:   Pneumonia PLAN:   D6/x levaquin, with the poor dentition, will continue for 14 days until extubated and seen by a dentist for likely tooth extraction. D/c vancomycin/ cefepime 12/15 and continue monitor.  ENDOCRINE  ASSESSMENT:   DM type II. PLAN:   SSI  NEUROLOGIC  ASSESSMENT:   EtOH withdrawal. Acute encephalopathy 2nd to sepsis, hypoxia, and cardiac arrest with ?anoxic injury.  PLAN:   Changed to fentanyl and precedex with good response, will continue.  Critical care time 35 minutes.  Alyson Reedy, M.D. [facility] Pulmonary/Critical Care Medicine. Pager: [phone]. After hours pager: [phone].

## 2012-04-06 NOTE — Progress Notes (Signed)
Upon assessment of pt, pt was noted to have vomited a large amt. Abdomen not distended or tender to palpation. Equal bowel sounds x4. Residual of 30cc noted from OG tube. Pt nodded "yes" when asked if he felt nauseated. Zofran 4mg  IV given.

## 2012-04-07 ENCOUNTER — Inpatient Hospital Stay (HOSPITAL_COMMUNITY): Payer: Medicare PPO

## 2012-04-07 LAB — CULTURE, BLOOD (ROUTINE X 2)

## 2012-04-07 LAB — BLOOD GAS, ARTERIAL
Acid-Base Excess: 9.6 mmol/L — ABNORMAL HIGH (ref 0.0–2.0)
FIO2: 0.3 %
MECHVT: 480 mL
Patient temperature: 99.1
TCO2: 34.3 mmol/L (ref 0–100)

## 2012-04-07 LAB — GLUCOSE, CAPILLARY
Glucose-Capillary: 168 mg/dL — ABNORMAL HIGH (ref 70–99)
Glucose-Capillary: 222 mg/dL — ABNORMAL HIGH (ref 70–99)
Glucose-Capillary: 144 mg/dL — ABNORMAL HIGH (ref 70–99)

## 2012-04-07 LAB — CBC
Hemoglobin: 9.3 g/dL — ABNORMAL LOW (ref 13.0–17.0)
MCH: 32.1 pg (ref 26.0–34.0)
RBC: 2.9 MIL/uL — ABNORMAL LOW (ref 4.22–5.81)

## 2012-04-07 LAB — BASIC METABOLIC PANEL
CO2: 35 mEq/L — ABNORMAL HIGH (ref 19–32)
Glucose, Bld: 151 mg/dL — ABNORMAL HIGH (ref 70–99)
Potassium: 3 mEq/L — ABNORMAL LOW (ref 3.5–5.1)
Sodium: 145 mEq/L (ref 135–145)

## 2012-04-07 LAB — PHOSPHORUS: Phosphorus: 3.3 mg/dL (ref 2.3–4.6)

## 2012-04-07 MED ORDER — FUROSEMIDE 10 MG/ML IJ SOLN
40.0000 mg | Freq: Three times a day (TID) | INTRAMUSCULAR | Status: AC
  Administered 2012-04-07 (×2): 40 mg via INTRAVENOUS
  Filled 2012-04-07 (×2): qty 4

## 2012-04-07 MED ORDER — LEVOFLOXACIN IN D5W 750 MG/150ML IV SOLN
750.0000 mg | INTRAVENOUS | Status: AC
  Administered 2012-04-08 – 2012-04-10 (×2): 750 mg via INTRAVENOUS
  Filled 2012-04-07 (×2): qty 150

## 2012-04-07 MED ORDER — POTASSIUM CHLORIDE 10 MEQ/50ML IV SOLN
10.0000 meq | INTRAVENOUS | Status: AC
  Administered 2012-04-07 (×4): 10 meq via INTRAVENOUS
  Filled 2012-04-07: qty 200

## 2012-04-07 MED ORDER — POTASSIUM CHLORIDE 20 MEQ/15ML (10%) PO LIQD
ORAL | Status: AC
  Administered 2012-04-07: 40 meq
  Filled 2012-04-07: qty 30

## 2012-04-07 MED ORDER — DEXTROSE 10 % IV SOLN
INTRAVENOUS | Status: DC | PRN
  Administered 2012-04-08: via INTRAVENOUS

## 2012-04-07 MED ORDER — POTASSIUM CHLORIDE 20 MEQ/15ML (10%) PO LIQD
40.0000 meq | Freq: Three times a day (TID) | ORAL | Status: DC
  Administered 2012-04-07: 40 meq
  Filled 2012-04-07 (×2): qty 30

## 2012-04-07 MED ORDER — INSULIN ASPART 100 UNIT/ML ~~LOC~~ SOLN
4.0000 [IU] | SUBCUTANEOUS | Status: DC
  Administered 2012-04-07 – 2012-04-29 (×117): 4 [IU] via SUBCUTANEOUS

## 2012-04-07 MED ORDER — MAGNESIUM SULFATE 40 MG/ML IJ SOLN
2.0000 g | Freq: Once | INTRAMUSCULAR | Status: AC
  Administered 2012-04-07: 2 g via INTRAVENOUS
  Filled 2012-04-07 (×2): qty 50

## 2012-04-07 NOTE — Progress Notes (Signed)
eLink Physician-Brief Progress Note Patient Name: Johnny [name] DOB: [birth year] MRN: [id number]  Date of Service  04/07/2012   HPI/Events of Note  Hypokalemia   eICU Interventions  Potassium replaced   Intervention Category Intermediate Interventions: Electrolyte abnormality - evaluation and management  DETERDING,ELIZABETH 04/07/2012, 7:03 AM

## 2012-04-07 NOTE — Clinical Social Work Note (Signed)
Clinical Social Worker received referral for substance abuse. CSW reviewed chart and patient is currently intubated and unable to participate. CSW will sign off for now as social work intervention is not appropriate at this time. Please re-consult when more appropriate.  Rozetta Nunnery MSW, [employer] [phone]

## 2012-04-07 NOTE — Progress Notes (Signed)
eLink Physician-Brief Progress Note Patient Name: Johnny [name] DOB: 07-09-1934 MRN: [id number]  Date of Service  04/07/2012   HPI/Events of Note  Hyperglycemia on TFs with blood sugars greater than 200   eICU Interventions  Plan: Additional novolog for TF coverage   Intervention Category Intermediate Interventions: Hyperglycemia - evaluation and treatment  DETERDING,ELIZABETH 04/07/2012, 12:16 AM

## 2012-04-07 NOTE — Progress Notes (Signed)
Inpatient Diabetes Program Recommendations  AACE/ADA: New Consensus Statement on Inpatient Glycemic Control (2013)  Target Ranges:  Prepandial:   less than 140 mg/dL      Peak postprandial:   less than 180 mg/dL (1-2 hours)      Critically ill patients:  140 - 180 mg/dL   Inpatient Diabetes Program Recommendations Insulin - Meal Coverage: Increase tube feed Novolog coverage to 5 units  Will follow. Thank you  Johnny [name] [facility] Inpatient Diabetes Coordinator [phone] (team pager) [phone]

## 2012-04-07 NOTE — Progress Notes (Addendum)
Inpatient Diabetes Program Recommendations  AACE/ADA: New Consensus Statement on Inpatient Glycemic Control (2013)  Target Ranges:  Prepandial:   less than 140 mg/dL      Peak postprandial:   less than 180 mg/dL (1-2 hours)      Critically ill patients:  140 - 180 mg/dL  Results for Johnny [name], Johnny [name] (MRN [id number]) as of 04/07/2012 11:15  Ref. Range 04/06/2012 20:04 04/06/2012 23:47 04/07/2012 03:59 04/07/2012 08:04  Glucose-Capillary Latest Range: 70-99 mg/dL 409 (H) 811 (H) 914 (H) 168 (H)    Has been added! Thank you!   Inpatient Diabetes Program Recommendations Insulin - Meal Coverage: Add Novolog 4 units Q 4 hours for TF coverage Thank you  Piedad Climes [facility] Inpatient Diabetes Coordinator [phone]

## 2012-04-07 NOTE — Progress Notes (Signed)
ANTIBIOTIC CONSULT NOTE - FOLLOW UP  Pharmacy Consult for Levaquin Indication: pneumonia  No Known Allergies  Patient Measurements: Height: 5' 10.5" (179.1 cm) Weight: 165 lb 2 oz (74.9 kg) IBW/kg (Calculated) : 74.15   Vital Signs: Temp: 101.2 F (38.4 C) (12/18 1200) Temp src: Oral (12/18 1200) BP: 112/53 mmHg (12/18 1239) Pulse Rate: 88  (12/18 1239) Intake/Output from previous day: 12/17 0701 - 12/18 0700 In: 2904 [I.V.:750; NG/GT:1640; IV Piggyback:514] Out: 3925 [Urine:3725; Stool:200] Intake/Output from this shift: Total I/O In: 751 [I.V.:127; NG/GT:470; IV Piggyback:154] Out: 400 [Urine:400]  Labs:  [facility] 04/07/12 0500 04/06/12 0500 04/05/12 0215  WBC 20.4* 14.9* 14.6*  HGB 9.3* 9.5* 9.0*  PLT 209 151 114*  LABCREA -- -- --  CREATININE 1.50* 1.13 1.14   Estimated Creatinine Clearance: 44.7 ml/min (by C-G formula based on Cr of 1.5). No results found for this basename: VANCOTROUGH:2,VANCOPEAK:2,VANCORANDOM:2,GENTTROUGH:2,GENTPEAK:2,GENTRANDOM:2,TOBRATROUGH:2,TOBRAPEAK:2,TOBRARND:2,AMIKACINPEAK:2,AMIKACINTROU:2,AMIKACIN:2, in the last 72 hours   Microbiology: Recent Results (from the past 720 hour(s))  URINE CULTURE     Status: Normal   Collection Time   04/01/12  2:40 PM      Component Value Range Status Comment   Specimen Description URINE, CATHETERIZED   Final    Special Requests NONE   Final    Culture  Setup Time 04/01/2012 15:34   Final    Colony Count NO GROWTH   Final    Culture NO GROWTH   Final    Report Status 04/02/2012 FINAL   Final   MRSA PCR SCREENING     Status: Normal   Collection Time   04/01/12 11:54 PM      Component Value Range Status Comment   MRSA by PCR NEGATIVE  NEGATIVE Final   CULTURE, BLOOD (ROUTINE X 2)     Status: Normal (Preliminary result)   Collection Time   04/02/12  1:18 AM      Component Value Range Status Comment   Specimen Description BLOOD CENTRAL LINE   Final    Special Requests BOTTLES DRAWN AEROBIC AND  ANAEROBIC 5CC EACH   Final    Culture  Setup Time 04/02/2012 14:26   Final    Culture     Final    Value:        BLOOD CULTURE RECEIVED NO GROWTH TO DATE CULTURE WILL BE HELD FOR 5 DAYS BEFORE ISSUING A FINAL NEGATIVE REPORT   Report Status PENDING   Incomplete   CULTURE, BLOOD (ROUTINE X 2)     Status: Normal (Preliminary result)   Collection Time   04/02/12  1:30 AM      Component Value Range Status Comment   Specimen Description BLOOD RIGHT HAND   Final    Special Requests BOTTLES DRAWN AEROBIC ONLY 1.5CC   Final    Culture  Setup Time 04/02/2012 14:26   Final    Culture     Final    Value:        BLOOD CULTURE RECEIVED NO GROWTH TO DATE CULTURE WILL BE HELD FOR 5 DAYS BEFORE ISSUING A FINAL NEGATIVE REPORT   Report Status PENDING   Incomplete   URINE CULTURE     Status: Normal   Collection Time   04/02/12  2:04 AM      Component Value Range Status Comment   Specimen Description URINE, CATHETERIZED   Final    Special Requests Normal   Final    Culture  Setup Time 04/02/2012 08:25   Final    Colony Count  NO GROWTH   Final    Culture NO GROWTH   Final    Report Status 04/03/2012 FINAL   Final   CULTURE, RESPIRATORY     Status: Normal   Collection Time   04/03/12 10:17 PM      Component Value Range Status Comment   Specimen Description TRACHEAL ASPIRATE   Final    Special Requests Normal   Final    Gram Stain     Final    Value: ABUNDANT WBC PRESENT, PREDOMINANTLY PMN     ABUNDANT YEAST     FEW GRAM POSITIVE COCCI IN PAIRS   Culture ABUNDANT CANDIDA ALBICANS   Final    Report Status 04/06/2012 FINAL   Final   CULTURE, BLOOD (ROUTINE X 2)     Status: Normal (Preliminary result)   Collection Time   04/04/12  4:23 PM      Component Value Range Status Comment   Specimen Description BLOOD RIGHT ARM   Final    Special Requests BOTTLES DRAWN AEROBIC AND ANAEROBIC 10CC   Final    Culture  Setup Time 04/04/2012 21:02   Final    Culture     Final    Value:        BLOOD CULTURE  RECEIVED NO GROWTH TO DATE CULTURE WILL BE HELD FOR 5 DAYS BEFORE ISSUING A FINAL NEGATIVE REPORT   Report Status PENDING   Incomplete   CULTURE, BLOOD (ROUTINE X 2)     Status: Normal   Collection Time   04/04/12  4:35 PM      Component Value Range Status Comment   Specimen Description BLOOD RIGHT HAND   Final    Special Requests BOTTLES DRAWN AEROBIC ONLY 2CC   Final    Culture  Setup Time 04/04/2012 21:02   Final    Culture     Final    Value: STAPHYLOCOCCUS SPECIES (COAGULASE NEGATIVE)     Note: THE SIGNIFICANCE OF ISOLATING THIS ORGANISM FROM A SINGLE SET OF BLOOD CULTURES WHEN MULTIPLE SETS ARE DRAWN IS UNCERTAIN. PLEASE NOTIFY THE MICROBIOLOGY DEPARTMENT WITHIN ONE WEEK IF SPECIATION AND SENSITIVITIES ARE REQUIRED.     Note: Gram Stain Report Called to,Read Back By and Verified With: SHANNA STOWE @0420AM  ON 04/06/12 BY MCLET   Report Status 04/07/2012 FINAL   Final     Anti-infectives     Start     Dose/Rate Route Frequency Ordered Stop   04/06/12 1800   vancomycin (VANCOCIN) 750 mg in sodium chloride 0.9 % 150 mL IVPB        750 mg 150 mL/hr over 60 Minutes Intravenous Every 12 hours 04/06/12 0438     04/06/12 0500   vancomycin (VANCOCIN) 1,500 mg in sodium chloride 0.9 % 500 mL IVPB        1,500 mg 250 mL/hr over 120 Minutes Intravenous  Once 04/06/12 0438 04/06/12 0713   04/03/12 1800   levofloxacin (LEVAQUIN) IVPB 750 mg  Status:  Discontinued        750 mg 100 mL/hr over 90 Minutes Intravenous Every 48 hours 04/01/12 2040 04/03/12 1530   04/03/12 1800   levofloxacin (LEVAQUIN) IVPB 750 mg  Status:  Discontinued        750 mg 100 mL/hr over 90 Minutes Intravenous Every 24 hours 04/03/12 1530 04/03/12 1533   04/03/12 1800   levofloxacin (LEVAQUIN) IVPB 750 mg        750 mg 100 mL/hr over 90 Minutes Intravenous Every 24  hours 04/03/12 1533 04/10/12 1759   04/03/12 1700   vancomycin (VANCOCIN) 750 mg in sodium chloride 0.9 % 150 mL IVPB  Status:  Discontinued         750 mg 150 mL/hr over 60 Minutes Intravenous Every 12 hours 04/03/12 1530 04/03/12 1534   04/03/12 1700   vancomycin (VANCOCIN) 750 mg in sodium chloride 0.9 % 150 mL IVPB  Status:  Discontinued        750 mg 150 mL/hr over 60 Minutes Intravenous Every 12 hours 04/03/12 1534 04/04/12 1013   04/02/12 1800   vancomycin (VANCOCIN) IVPB 1000 mg/200 mL premix  Status:  Discontinued        1,000 mg 200 mL/hr over 60 Minutes Intravenous Every 24 hours 04/01/12 2040 04/03/12 1530   04/02/12 0100   vancomycin (VANCOCIN) IVPB 1000 mg/200 mL premix  Status:  Discontinued        1,000 mg 200 mL/hr over 60 Minutes Intravenous  Once 04/02/12 0058 04/02/12 0102   04/01/12 2200   ceFEPIme (MAXIPIME) 1 g in dextrose 5 % 50 mL IVPB  Status:  Discontinued        1 g 100 mL/hr over 30 Minutes Intravenous 3 times per day 04/01/12 2031 04/01/12 2040   04/01/12 2200   ceFEPIme (MAXIPIME) 2 g in dextrose 5 % 50 mL IVPB  Status:  Discontinued        2 g 100 mL/hr over 30 Minutes Intravenous Every 24 hours 04/01/12 2040 04/04/12 1013   04/01/12 2100   levofloxacin (LEVAQUIN) IVPB 750 mg  Status:  Discontinued        750 mg 100 mL/hr over 90 Minutes Intravenous Every 24 hours 04/01/12 2031 04/01/12 2040   04/01/12 1700   ceFEPIme (MAXIPIME) 2 g in dextrose 5 % 50 mL IVPB  Status:  Discontinued        2 g 100 mL/hr over 30 Minutes Intravenous Every 24 hours 04/01/12 1552 04/01/12 2031   04/01/12 1700   levofloxacin (LEVAQUIN) IVPB 750 mg  Status:  Discontinued        750 mg 100 mL/hr over 90 Minutes Intravenous Every 48 hours 04/01/12 1552 04/01/12 2031   04/01/12 1630   vancomycin (VANCOCIN) IVPB 1000 mg/200 mL premix        1,000 mg 200 mL/hr over 60 Minutes Intravenous  Once 04/01/12 1553 04/01/12 1716   04/01/12 1545   ceFEPIme (MAXIPIME) 1 g in dextrose 5 % 50 mL IVPB  Status:  Discontinued        1 g 100 mL/hr over 30 Minutes Intravenous 3 times per day 04/01/12 1536 04/01/12 1552   04/01/12 1545    levofloxacin (LEVAQUIN) IVPB 750 mg  Status:  Discontinued        750 mg 100 mL/hr over 90 Minutes Intravenous Every 24 hours 04/01/12 1536 04/01/12 1552          Assessment: 77 y/o male on day 7 Levaquin for PNA. SCr bumped up from yesterday. Patient is febrile and WBC are trending up. Spoke with Dr. Molli Knock and received order to d/c vancomycin.   12/14 TA --> abundant Candida 12/15 Blood x2 --> 1 of 2 with Coag Neg Staph  Goal of Therapy:  infection resolution  Plan:  -Change Levaquin to 750 mg IV q48h - to stop 12/21 per previous order -Will follow-up renal function and adjust as needed  [facility], [place].D., BCPS Clinical Pharmacist Pager: [phone] 04/07/2012 1:09 PM

## 2012-04-07 NOTE — Progress Notes (Signed)
PULMONARY  / CRITICAL CARE MEDICINE  Name: KENDERICK KOBLER MRN: [id number] DOB: 1934-12-03    LOS: 6  REFERRING MD :  TRH  CHIEF COMPLAINT:  Alcohol withdrawal/HCAP >> Cardiac arrest  BRIEF PATIENT DESCRIPTION:  77 yo male former smoker admitted 04/01/2012 with shakes and incontinent of urine/feces and found to have pneumonia in setting of ETOH abuse/withdrawal.  Developed septic shock, and PEA on 12/13 with VDRF and 20 min before ROSC.  PCCM assumed care from 12/13.  Significant PMHx ETOH, DM type II, CAD s/p CABG, A fib, HTN, Hyperlipidemia, Low back pain   LINES / TUBES: 12/12 ETT >> 12/12 L IJ CVL >> 12/13 Lt radial Aline>>12/14  CULTURES: 12/12 resp >>GPC in pairs and candida albicans. 12/12 blood >>NTD 12/12 urine >>negative 12/12 influenza >negative 12/12 urine strep >> negative 12/12 urine leg >>negative  ANTIBIOTICS: 12/12 Vanc (HCAP) >>12/15 12/12 Cefepime (HCAP) >>12/15 12/12 Levaqin (HCAP) >>  SIGNIFICANT EVENTS:  12/12 admission and PEA arrest 12/14 Off pressors  TESTS: 12/12 CT head >> chronic microvascular ischemic changes 12/13 EEG >> moderate-to-severe generalized continuous slowing of cerebral activity 12/13 Echo >> EF 50 to 55% (tested while on dobutamine) 12/13 Abd u/s >> hepatic steatosis, medical renal disease, gallbladder sludge with small stones  LEVEL OF CARE:  ICU PRIMARY SERVICE:  PCCM CONSULTANTS:  None CODE STATUS: full  DIET:  Tube feeds DVT Px:  Lovenox GI Px:  Pantoprazole   INTERVAL HISTORY:  Open's eyes with stimulation.  Did not tolerate pressure support.  VITAL SIGNS: Temp:  [98.4 F (36.9 C)-99.9 F (37.7 C)] 99.8 F (37.7 C) (12/18 0800) Pulse Rate:  [77-122] 77  (12/18 0900) Resp:  [14-34] 23  (12/18 0900) BP: (97-153)/(50-82) 144/70 mmHg (12/18 0900) SpO2:  [95 %-100 %] 100 % (12/18 0900) FiO2 (%):  [29.7 %-30.4 %] 30 % (12/18 0900) Weight:  [74.9 kg (165 lb 2 oz)] 74.9 kg (165 lb 2 oz) (12/18  0400) HEMODYNAMICS: CVP:  [4 mmHg-9 mmHg] 9 mmHg VENTILATOR SETTINGS: Vent Mode:  [-] PRVC FiO2 (%):  [29.7 %-30.4 %] 30 % Set Rate:  [14 bmp] 14 bmp Vt Set:  [480 mL] 480 mL PEEP:  [5 cmH20] 5 cmH20 Pressure Support:  [10 cmH20] 10 cmH20 Plateau Pressure:  [14 cmH20-19 cmH20] 19 cmH20 INTAKE / OUTPUT: Intake/Output      12/17 0701 - 12/18 0700 12/18 0701 - 12/19 0700   I.V. (mL/kg) 750 (10) 50 (0.7)   NG/GT 1640 140   IV Piggyback 514    Total Intake(mL/kg) 2904 (38.8) 190 (2.5)   Urine (mL/kg/hr) 3725 (2.1) 400   Stool 200    Total Output 3925 400   Net -1021 -210         PHYSICAL EXAMINATION: Gen: No distress, more alert and interactive. HEENT: ETT in place, very poor dentition. PULM: Decreased breath sounds, no wheeze. CV: s1s2 regular, no murmur. AB: decreased bowel sounds, soft, non tender. Ext: no edema. Derm: excoriations around sacral region. Neuro: open's eyes with stimulation, and following commands.  LABS: Cbc  Lab 04/07/12 0500 04/06/12 0500 04/05/12 0215  WBC 20.4* -- --  HGB 9.3* 9.5* 9.0*  HCT 27.1* 28.2* 26.0*  PLT 209 151 114*   Chemistry  Lab 04/07/12 0500 04/06/12 0500 04/05/12 0215 04/04/12 0400  NA 145 141 141 --  K 3.0* 3.3* 4.1 --  CL 98 99 105 --  CO2 35* 32 28 --  BUN 56* 39* 35* --  CREATININE 1.50* 1.13 1.14 --  CALCIUM 8.7 9.0 8.9 --  MG 1.6 1.5 -- 1.6  PHOS 3.3 2.8 -- 2.0*  GLUCOSE 151* 282* 175* --   Liver fxn  Lab 04/03/12 0500 04/02/12 0118 04/01/12 1400  AST 66* 236* 117*  ALT 33 61* 31  ALKPHOS 80 103 138*  BILITOT 0.4 1.2 1.6*  PROT 5.3* 6.4 8.1  ALBUMIN 1.9* 2.6* 3.4*   coags  Lab 04/02/12 0118  APTT 39*  INR 1.57*   Sepsis markers  Lab 04/02/12 0130  LATICACIDVEN 6.6*  PROCALCITON 18.02   Cardiac markers  Lab 04/02/12 1522 04/02/12 0610 04/02/12 0010  CKTOTAL -- -- --  CKMB -- -- --  TROPONINI 0.34* 0.54* <0.30   BNP No results found for this basename: PROBNP:3 in the last 168  hours ABG  Lab 04/07/12 0500 04/06/12 0500 04/03/12 0430  PHART 7.521* 7.540* 7.392  PCO2ART 40.7 36.6 34.0*  PO2ART 68.5* 93.3 128.0*  HCO3 33.0* 31.2* 20.7  TCO2 34.3 32.4 22   CBG trend  Lab 04/07/12 0804 04/07/12 0359 04/06/12 2347 04/06/12 2004 04/06/12 1536  GLUCAP 168* 151* 282* 365* 147*   IMAGING:  Dg Chest Port 1 View  04/07/2012  *RADIOLOGY REPORT*  Clinical Data: Shortness of breath.  Ventilator dependence.  PORTABLE CHEST - 1 VIEW  Comparison: 04/06/2012  Findings: 0559 hours.  The right base airspace disease persists without substantial interval change.  Left lung is clear. The cardiopericardial silhouette is within normal limits for size. Endotracheal tube tip is 5.0 cm above the base of the carina.  NG tube tip overlies the gastric fundus.  Left IJ central line tip overlies the distal left innominate vein.  IMPRESSION: No substantial interval change exam.  Persistent patchy airspace disease at the right base.   Original Report Authenticated By: Kennith Center, M.D.    Dg Chest Port 1 View  04/06/2012  *RADIOLOGY REPORT*  Clinical Data: Evaluate endotracheal tube position.  PORTABLE CHEST - 1 VIEW  Comparison: 04/05/2012.  Findings: The support apparatus is stable.  The cardiac silhouette, mediastinal and hilar contours are unchanged.  There is a persistent right lower lobe infiltrate.  No pulmonary edema or pleural effusion.  IMPRESSION:  1.  Stable support apparatus. 2.  Persistent right lower lobe infiltrate.   Original Report Authenticated By: Rudie Meyer, M.D.    DIAGNOSES: Principal Problem:  *HCAP (healthcare-associated pneumonia) Active Problems:  DIABETES MELLITUS, TYPE II  HYPERTENSION  Alcohol dependence  ARF (acute renal failure)  Volume depletion  Hyponatremia  Acute respiratory failure with hypoxia  Cardiac arrest  Septic shock(785.52)  Encephalopathy  ASSESSMENT / PLAN:  PULMONARY  ASSESSMENT: Acute respiratory failure in setting of PNA and  cardiac arrest with hx of smoking. PLAN:   - Continue PS trials, hold extubation due to decrease mental status, agitation, increased secretion. - F/u CXR and ABG. - SBT in AM. - Diurese gently today.  CARDIOVASCULAR  ASSESSMENT:  PEA. Likely related to pneumonia, acidosis, hypoxic respiratory failure in setting of ETOH withdrawal. Septic shock 2nd to PNA. Resolved 12/14. Hypervolemia. PLAN:  - Per cards.  RENAL  ASSESSMENT:   Acute renal failure 2nd to shock. Baseline creatinine 0.95 from 01/05/12.  Resolved 12/15. Lactic acidosis related to shock and liver dysfx. Resolved. Hypokalemia, hypomagnesemia, hypophosphatemia.  Intake/Output Summary (Last 24 hours) at 04/07/12 1019 Last data filed at 04/07/12 0900  Gross per 24 hour  Intake   2800 ml  Output   3800 ml  Net  -1000 ml   PLAN:   -  Monitor renal fx, urine outpt. - Lasix 40 mg IV q8h x 2 doses, hold zaroxolyn. - Electrolytes replacement as needed.  GASTROINTESTINAL  ASSESSMENT:   Protein calorie malnutrition with hx of ETOH. Fatty liver 2nd to ETOH. PLAN:   - Tube feeds while on vent. - Thiamine, folic acid, MVI.  HEMATOLOGIC  ASSESSMENT:   Anemia, thrombocytopenia likely related to ETOH and chronic disease with critical illness. PLAN:  - F/u CBC. - Transfuse for Hb < 7.  INFECTIOUS  ASSESSMENT:   Pneumonia PLAN:   D7/x levaquin, with the poor dentition, will continue for 14 days until extubated and seen by a dentist for likely tooth extraction. D/c vancomycin/cefepime 12/15 and continue monitor.  ENDOCRINE  ASSESSMENT:   DM type II. PLAN:   SSI  NEUROLOGIC  ASSESSMENT:   EtOH withdrawal. Acute encephalopathy 2nd to sepsis, hypoxia, and cardiac arrest with ?anoxic injury.  PLAN:   Changed to fentanyl and precedex with good response, will continue.  Critical care time 35 minutes.  Alyson Reedy, M.D. [facility] Pulmonary/Critical Care Medicine. Pager: [phone]. After hours pager:  [phone].

## 2012-04-08 ENCOUNTER — Inpatient Hospital Stay (HOSPITAL_COMMUNITY): Payer: Medicare PPO

## 2012-04-08 LAB — BASIC METABOLIC PANEL
BUN: 68 mg/dL — ABNORMAL HIGH (ref 6–23)
CO2: 36 mEq/L — ABNORMAL HIGH (ref 19–32)
Chloride: 99 mEq/L (ref 96–112)
Glucose, Bld: 150 mg/dL — ABNORMAL HIGH (ref 70–99)
Potassium: 3.3 mEq/L — ABNORMAL LOW (ref 3.5–5.1)

## 2012-04-08 LAB — BLOOD GAS, ARTERIAL
Acid-Base Excess: 9.8 mmol/L — ABNORMAL HIGH (ref 0.0–2.0)
Drawn by: 36496
FIO2: 0.3 %
MECHVT: 480 mL
O2 Saturation: 96.6 %
RATE: 14 resp/min

## 2012-04-08 LAB — CULTURE, BLOOD (ROUTINE X 2): Culture: NO GROWTH

## 2012-04-08 LAB — GLUCOSE, CAPILLARY
Glucose-Capillary: 178 mg/dL — ABNORMAL HIGH (ref 70–99)
Glucose-Capillary: 106 mg/dL — ABNORMAL HIGH (ref 70–99)
Glucose-Capillary: 306 mg/dL — ABNORMAL HIGH (ref 70–99)
Glucose-Capillary: 276 mg/dL — ABNORMAL HIGH (ref 70–99)
Glucose-Capillary: 148 mg/dL — ABNORMAL HIGH (ref 70–99)

## 2012-04-08 LAB — CBC
HCT: 28.4 % — ABNORMAL LOW (ref 39.0–52.0)
Hemoglobin: 9.6 g/dL — ABNORMAL LOW (ref 13.0–17.0)
WBC: 23.5 10*3/uL — ABNORMAL HIGH (ref 4.0–10.5)

## 2012-04-08 MED ORDER — HEPARIN SODIUM (PORCINE) 5000 UNIT/ML IJ SOLN
5000.0000 [IU] | Freq: Three times a day (TID) | INTRAMUSCULAR | Status: DC
  Administered 2012-04-08 – 2012-04-12 (×12): 5000 [IU] via SUBCUTANEOUS
  Filled 2012-04-08 (×15): qty 1

## 2012-04-08 MED ORDER — VECURONIUM BROMIDE 10 MG IV SOLR
10.0000 mg | Freq: Once | INTRAVENOUS | Status: AC
  Administered 2012-04-09: 7 mg via INTRAVENOUS
  Filled 2012-04-08: qty 10

## 2012-04-08 MED ORDER — POTASSIUM CHLORIDE 20 MEQ/15ML (10%) PO LIQD
ORAL | Status: AC
  Filled 2012-04-08: qty 15

## 2012-04-08 MED ORDER — POTASSIUM CHLORIDE 20 MEQ/15ML (10%) PO LIQD
ORAL | Status: AC
  Administered 2012-04-08: 40 meq
  Filled 2012-04-08: qty 15

## 2012-04-08 MED ORDER — DEXMEDETOMIDINE HCL IN NACL 200 MCG/50ML IV SOLN
0.4000 ug/kg/h | INTRAVENOUS | Status: DC
  Administered 2012-04-08: 1.1 ug/kg/h via INTRAVENOUS
  Administered 2012-04-08: 1 ug/kg/h via INTRAVENOUS
  Administered 2012-04-08: 1.1 ug/kg/h via INTRAVENOUS
  Administered 2012-04-08: 0.9 ug/kg/h via INTRAVENOUS
  Administered 2012-04-09 (×8): 1 ug/kg/h via INTRAVENOUS
  Administered 2012-04-10: 0.7 ug/kg/h via INTRAVENOUS
  Administered 2012-04-10: 1.2 ug/kg/h via INTRAVENOUS
  Administered 2012-04-10: 0.7 ug/kg/h via INTRAVENOUS
  Administered 2012-04-10: 1.2 ug/kg/h via INTRAVENOUS
  Administered 2012-04-10: 0.5 ug/kg/h via INTRAVENOUS
  Administered 2012-04-10: 0.9 ug/kg/h via INTRAVENOUS
  Administered 2012-04-10: 0.7 ug/kg/h via INTRAVENOUS
  Administered 2012-04-10: 1.2 ug/kg/h via INTRAVENOUS
  Administered 2012-04-11: 1 ug/kg/h via INTRAVENOUS
  Administered 2012-04-11: 0.7 ug/kg/h via INTRAVENOUS
  Administered 2012-04-11 – 2012-04-12 (×6): 1 ug/kg/h via INTRAVENOUS
  Filled 2012-04-08 (×3): qty 50
  Filled 2012-04-08: qty 100
  Filled 2012-04-08 (×27): qty 50

## 2012-04-08 MED ORDER — POTASSIUM CHLORIDE 20 MEQ/15ML (10%) PO LIQD
40.0000 meq | Freq: Once | ORAL | Status: AC
  Administered 2012-04-08: 40 meq
  Filled 2012-04-08: qty 30

## 2012-04-08 MED ORDER — FENTANYL CITRATE 0.05 MG/ML IJ SOLN
200.0000 ug | Freq: Once | INTRAMUSCULAR | Status: AC
  Administered 2012-04-09: 100 ug via INTRAVENOUS
  Filled 2012-04-08: qty 4

## 2012-04-08 MED ORDER — MIDAZOLAM HCL 2 MG/2ML IJ SOLN
4.0000 mg | Freq: Once | INTRAMUSCULAR | Status: AC
  Administered 2012-04-09: 2 mg via INTRAVENOUS
  Filled 2012-04-08: qty 4

## 2012-04-08 MED ORDER — PROPOFOL 10 MG/ML IV EMUL: 5.0000 ug/kg/min | Freq: Once | INTRAVENOUS | Status: DC

## 2012-04-08 MED ORDER — ETOMIDATE 2 MG/ML IV SOLN
40.0000 mg | Freq: Once | INTRAVENOUS | Status: AC
  Administered 2012-04-09: 20 mg via INTRAVENOUS
  Filled 2012-04-08: qty 20

## 2012-04-08 NOTE — Progress Notes (Signed)
PULMONARY  / CRITICAL CARE MEDICINE  Name: Johnny [name] MRN: [id number] DOB: [birth year]    LOS: 7  REFERRING MD :  TRH  CHIEF COMPLAINT:  Alcohol withdrawal/HCAP >> Cardiac arrest  BRIEF PATIENT DESCRIPTION:  77 yo male former smoker admitted 04/01/2012 with shakes and incontinent of urine/feces and found to have pneumonia in setting of ETOH abuse/withdrawal.  Developed septic shock, and PEA on 12/13 with VDRF and 20 min before ROSC.  PCCM assumed care from 12/13.  Significant PMHx ETOH, DM type II, CAD s/p CABG, A fib, HTN, Hyperlipidemia, Low back pain   LINES / TUBES: 12/12 ETT >> 12/12 L IJ CVL >> 12/13 Lt radial Aline>>12/14  CULTURES: 12/12 resp >>GPC in pairs and candida albicans. 12/12 blood >>NTD 12/12 urine >>negative 12/12 influenza >negative 12/12 urine strep >> negative 12/12 urine leg >>negative  ANTIBIOTICS: 12/12 Vanc (HCAP) >>12/15 12/12 Cefepime (HCAP) >>12/15 12/12 Levaqin (HCAP) >>  SIGNIFICANT EVENTS:  12/12 admission and PEA arrest 12/14 Off pressors  TESTS: 12/12 CT head >> chronic microvascular ischemic changes 12/13 EEG >> moderate-to-severe generalized continuous slowing of cerebral activity 12/13 Echo >> EF 50 to 55% (tested while on dobutamine) 12/13 Abd u/s >> hepatic steatosis, medical renal disease, gallbladder sludge with small stones  LEVEL OF CARE:  ICU PRIMARY SERVICE:  PCCM CONSULTANTS:  None CODE STATUS: full  DIET:  Tube feeds DVT Px:  Lovenox GI Px:  Pantoprazole   INTERVAL HISTORY:  Open's eyes with stimulation.  Did not tolerate pressure support.  VITAL SIGNS: Temp:  [98.3 F (36.8 C)-101.4 F (38.6 C)] 101.4 F (38.6 C) (12/19 0730) Pulse Rate:  [79-108] 101  (12/19 0800) Resp:  [14-28] 28  (12/19 0800) BP: (92-133)/(47-83) 109/64 mmHg (12/19 0724) SpO2:  [96 %-100 %] 100 % (12/19 0800) FiO2 (%):  [29.6 %-30.4 %] 30 % (12/19 0800) Weight:  [73.8 kg (162 lb 11.2 oz)] 73.8 kg (162 lb 11.2 oz) (12/19  0600) HEMODYNAMICS:   VENTILATOR SETTINGS: Vent Mode:  [-] PSV;CPAP FiO2 (%):  [29.6 %-30.4 %] 30 % Set Rate:  [14 bmp] 14 bmp Vt Set:  [480 mL] 480 mL PEEP:  [5 cmH20] 5 cmH20 Pressure Support:  [5 cmH20-8 cmH20] 8 cmH20 Plateau Pressure:  [18 cmH20-19 cmH20] 19 cmH20 INTAKE / OUTPUT: Intake/Output      12/18 0701 - 12/19 0700 12/19 0701 - 12/20 0700   I.V. (mL/kg) 709.4 (9.6)    Other 30    NG/GT 1700    IV Piggyback 258    Total Intake(mL/kg) 2697.4 (36.6)    Urine (mL/kg/hr) 2625 (1.5)    Stool     Total Output 2625    Net +72.4         Urine Occurrence 1 x     PHYSICAL EXAMINATION: Gen: No distress, more alert and interactive. HEENT: ETT in place, very poor dentition. PULM: Decreased breath sounds, no wheeze. CV: s1s2 regular, no murmur. AB: decreased bowel sounds, soft, non tender. Ext: no edema. Derm: excoriations around sacral region. Neuro: open's eyes with stimulation, and following commands.  Able to interact today and answering questions.  LABS: Cbc  Lab 04/08/12 0350 04/07/12 0500 04/06/12 0500  WBC 23.5* -- --  HGB 9.6* 9.3* 9.5*  HCT 28.4* 27.1* 28.2*  PLT 249 209 151   Chemistry  Lab 04/08/12 0350 04/07/12 0500 04/06/12 0500  NA 144 145 141  K 3.3* 3.0* 3.3*  CL 99 98 99  CO2 36* 35* 32  BUN 68*  56* 39*  CREATININE 1.66* 1.50* 1.13  CALCIUM 8.5 8.7 9.0  MG 2.3 1.6 1.5  PHOS 3.9 3.3 2.8  GLUCOSE 150* 151* 282*   Liver fxn  Lab 04/03/12 0500 04/02/12 0118 04/01/12 1400  AST 66* 236* 117*  ALT 33 61* 31  ALKPHOS 80 103 138*  BILITOT 0.4 1.2 1.6*  PROT 5.3* 6.4 8.1  ALBUMIN 1.9* 2.6* 3.4*   coags  Lab 04/02/12 0118  APTT 39*  INR 1.57*   Sepsis markers  Lab 04/02/12 0130  LATICACIDVEN 6.6*  PROCALCITON 18.02   Cardiac markers  Lab 04/02/12 1522 04/02/12 0610 04/02/12 0010  CKTOTAL -- -- --  CKMB -- -- --  TROPONINI 0.34* 0.54* <0.30   BNP No results found for this basename: PROBNP:3 in the last 168 hours ABG  Lab  04/08/12 0346 04/07/12 0500 04/06/12 0500  PHART 7.527* 7.521* 7.540*  PCO2ART 40.3 40.7 36.6  PO2ART 72.4* 68.5* 93.3  HCO3 33.3* 33.0* 31.2*  TCO2 34.5 34.3 32.4   CBG trend  Lab 04/08/12 0733 04/08/12 0341 04/07/12 2321 04/07/12 1947 04/07/12 1702  GLUCAP 220* 106* 144* 178* 178*    Intake/Output Summary (Last 24 hours) at 04/08/12 1040 Last data filed at 04/08/12 0700  Gross per 24 hour  Intake 2277.37 ml  Output   2225 ml  Net  52.37 ml   IMAGING:  Dg Chest Port 1 View  04/08/2012  *RADIOLOGY REPORT*  Clinical Data: Evaluate endotracheal tube position  PORTABLE CHEST - 1 VIEW  Comparison: Portable chest x-ray of 04/07/2012  Findings: The carina is very difficult to visualize in this patient.  The tip of the endotracheal tube is at least 4.4 cm above the carina.  No infiltrate or effusion is seen.  The heart is mildly enlarged.  Left central venous line tip overlies the region of the left innominate vein.  IMPRESSION:  1.  No active lung disease. 2.  The carina is difficult to visualize but the tip of the endotracheal tube is at least 4.4 cm above the carina.   Original Report Authenticated By: Dwyane Dee, M.D.    Dg Chest Port 1 View  04/07/2012  *RADIOLOGY REPORT*  Clinical Data: Shortness of breath.  Ventilator dependence.  PORTABLE CHEST - 1 VIEW  Comparison: 04/06/2012  Findings: 0559 hours.  The right base airspace disease persists without substantial interval change.  Left lung is clear. The cardiopericardial silhouette is within normal limits for size. Endotracheal tube tip is 5.0 cm above the base of the carina.  NG tube tip overlies the gastric fundus.  Left IJ central line tip overlies the distal left innominate vein.  IMPRESSION: No substantial interval change exam.  Persistent patchy airspace disease at the right base.   Original Report Authenticated By: Kennith Center, M.D.    DIAGNOSES: Principal Problem:  *HCAP (healthcare-associated pneumonia) Active Problems:   DIABETES MELLITUS, TYPE II  HYPERTENSION  Alcohol dependence  ARF (acute renal failure)  Volume depletion  Hyponatremia  Acute respiratory failure with hypoxia  Cardiac arrest  Septic shock(785.52)  Encephalopathy  ASSESSMENT / PLAN:  PULMONARY  ASSESSMENT: Acute respiratory failure in setting of PNA and cardiac arrest with hx of smoking. PLAN:   - Continue PS trials, unable to extubate due to increase WOB. - F/u CXR and ABG. - Trach in AM. - Diurese gently today.  CARDIOVASCULAR  ASSESSMENT:  PEA. Likely related to pneumonia, acidosis, hypoxic respiratory failure in setting of ETOH withdrawal. Septic shock 2nd to PNA. Resolved  12/14. Hypervolemia. PLAN:  - Per cards. - Hold all antiHTN due to low BP and worsening renal function.  RENAL  ASSESSMENT:   Acute renal failure 2nd to shock. Baseline creatinine 0.95 from 01/05/12.  Resolved 12/15. Lactic acidosis related to shock and liver dysfx. Resolved. Hypokalemia, hypomagnesemia, hypophosphatemia.  Intake/Output Summary (Last 24 hours) at 04/08/12 1038 Last data filed at 04/08/12 0700  Gross per 24 hour  Intake 2277.37 ml  Output   2225 ml  Net  52.37 ml   PLAN:   - Monitor renal fx, urine outpt. - Hold lasix for today to allow for renal function to normalize. - Electrolytes replacement as needed.  GASTROINTESTINAL  ASSESSMENT:   Protein calorie malnutrition with hx of ETOH. Fatty liver 2nd to ETOH. PLAN:   - Tube feeds while on vent but hold for trach placement in AM. - Thiamine, folic acid, MVI.  HEMATOLOGIC  ASSESSMENT:   Anemia, thrombocytopenia likely related to ETOH and chronic disease with critical illness. PLAN:  - F/u CBC. - Transfuse for Hb < 7.  INFECTIOUS Blood 12/15>>>NTD Sputum 12/14>>>Candida Urine 12/15>>>NTD ASSESSMENT:   Pneumonia PLAN:   D8/x levaquin, with the poor dentition, will continue for 14 days until extubated and seen by a dentist for likely tooth  extraction. D/c vancomycin/cefepime 12/15 and continue monitor.  ENDOCRINE  ASSESSMENT:   DM type II. PLAN:   SSI  NEUROLOGIC  ASSESSMENT:   EtOH withdrawal. Acute encephalopathy 2nd to sepsis, hypoxia, and cardiac arrest with ?anoxic injury.  PLAN:   Changed to fentanyl and precedex with good response, will continue.  Spoke with patient and daughters, the daughters do not think the patient would want trach/peg/SNF but after speaking with patient with the family in the room, he was clearly mentating and decided he wishes for [place] to proceed with trach/peg, will trach in AM and consult GI for PEG.  Critical care time 35 minutes.  Alyson Reedy, M.D. [facility] Pulmonary/Critical Care Medicine. Pager: [phone]. After hours pager: [phone].

## 2012-04-08 NOTE — Progress Notes (Signed)
eLink Physician-Brief Progress Note Patient Name: Johnny [name] DOB: 03-16-1935 MRN: [id number]  Date of Service  04/08/2012   HPI/Events of Note  Hypokalemia   eICU Interventions  Potassium replaced   Intervention Category Minor Interventions: Electrolytes abnormality - evaluation and management  [name] 04/08/2012, 4:45 AM

## 2012-04-08 NOTE — Progress Notes (Signed)
NUTRITION FOLLOW UP  Intervention:   1. Continue with current TF regimen.  2. Recommend one PEG is placed- continue this sam TF regimen 3. RD will continue to follow    Nutrition Dx:   Inadequate oral intake related to inability to eat as evidenced by NPO.    Goal:   En to meet >/=90% estimated nutrition needs. Met  Monitor:   Vent status, Trach/PEG, weight trends, I/O's  Assessment:   Remains intubated, increased WOB. Pt awake and alert on vent. Per pt desire, CCM proceeding with trach tomorrow. GI consulted for PEG placement.  Continue on OG tube feedings at this time. One episode of vomiting 12/17.   Patient has OG in place with tip of tube stomach. Jevity 1.2 is infusing @ 60 ml/hr. 30 ml Prostat via tube TID. Tube feeding regimen currently providing 2028  kcal, 125 grams protein, and 1162 ml H2O.   Residuals: 30 ml after pt vomited, none others documented.   Last bm: 12/18 Pt continues with rectal pouch for diarrhea.   Patient is currently intubated on ventilator support.  MV: 8.7 Temp:Temp (24hrs), Avg:99.5 F (37.5 C), Min:98.3 F (36.8 C), Max:101.4 F (38.6 C)     Height: Ht Readings from Last 1 Encounters:  04/01/12 5' 10.5" (1.791 m)    Weight Status:   Wt Readings from Last 1 Encounters:  04/08/12 162 lb 11.2 oz (73.8 kg)  Trending down   Re-estimated needs:  Kcal: 1938  Protein: 115-130 gm  Fluid: per team  Skin: intact   Diet Order: NPO   Intake/Output Summary (Last 24 hours) at 04/08/12 1115 Last data filed at 04/08/12 0700  Gross per 24 hour  Intake 2142.04 ml  Output   2225 ml  Net -82.96 ml     Labs:   Lab 04/08/12 0350 04/07/12 0500 04/06/12 0500  NA 144 145 141  K 3.3* 3.0* 3.3*  CL 99 98 99  CO2 36* 35* 32  BUN 68* 56* 39*  CREATININE 1.66* 1.50* 1.13  CALCIUM 8.5 8.7 9.0  MG 2.3 1.6 1.5  PHOS 3.9 3.3 2.8  GLUCOSE 150* 151* 282*   Sodium  Date/Time Value Range Status  04/08/2012  3:50 AM 144  135 - 145 mEq/L  Final  04/07/2012  5:00 AM 145  135 - 145 mEq/L Final  04/06/2012  5:00 AM 141  135 - 145 mEq/L Final    Potassium  Date/Time Value Range Status  04/08/2012  3:50 AM 3.3* 3.5 - 5.1 mEq/L Final  04/07/2012  5:00 AM 3.0* 3.5 - 5.1 mEq/L Final  04/06/2012  5:00 AM 3.3* 3.5 - 5.1 mEq/L Final     DELTA CHECK NOTED    Phosphorus  Date/Time Value Range Status  04/08/2012  3:50 AM 3.9  2.3 - 4.6 mg/dL Final  40/98/1191  4:78 AM 3.3  2.3 - 4.6 mg/dL Final  29/56/2130  8:65 AM 2.8  2.3 - 4.6 mg/dL Final    Magnesium  Date/Time Value Range Status  04/08/2012  3:50 AM 2.3  1.5 - 2.5 mg/dL Final  78/46/9629  5:28 AM 1.6  1.5 - 2.5 mg/dL Final  41/32/4401  0:27 AM 1.5  1.5 - 2.5 mg/dL Final     CBG (last 3)   Basename 04/08/12 0733 04/08/12 0341 04/07/12 2321  GLUCAP 220* 106* 144*    Scheduled Meds:   . antiseptic oral rinse  15 mL Mouth Rinse QID  . chlorhexidine  15 mL Mouth Rinse BID  .  etomidate  40 mg Intravenous Once  . feeding supplement  30 mL Per Tube TID  . fentaNYL  200 mcg Intravenous Once  . folic acid  1 mg Oral Daily  . heparin subcutaneous  5,000 Units Subcutaneous Q8H  . insulin aspart  0-15 Units Subcutaneous Q4H  . insulin aspart  4 Units Subcutaneous Q4H  . levofloxacin (LEVAQUIN) IV  750 mg Intravenous Q48H  . midazolam  4 mg Intravenous Once  . multivitamin  5 mL Oral Daily  . pantoprazole sodium  40 mg Per Tube Q24H  . potassium chloride      . propofol  5-70 mcg/kg/min Intravenous Once  . thiamine  100 mg Oral Daily  . vecuronium  10 mg Intravenous Once    Continuous Infusions:   . sodium chloride 20 mL/hr at 04/08/12 0700  . dexmedetomidine 1 mcg/kg/hr (04/08/12 1017)  . dextrose    . feeding supplement (JEVITY 1.2 CAL) 1,000 mL (04/08/12 1110)    Clarene Duke RD, LDN Pager [phone] After Hours pager [phone]

## 2012-04-09 ENCOUNTER — Inpatient Hospital Stay (HOSPITAL_COMMUNITY): Payer: Medicare PPO

## 2012-04-09 ENCOUNTER — Encounter (HOSPITAL_COMMUNITY): Payer: Medicare PPO

## 2012-04-09 DIAGNOSIS — F1020 Alcohol dependence, uncomplicated: Secondary | ICD-10-CM

## 2012-04-09 LAB — POCT I-STAT 3, ART BLOOD GAS (G3+)
O2 Saturation: 99 %
pCO2 arterial: 32.9 mmHg — ABNORMAL LOW (ref 35.0–45.0)
pH, Arterial: 7.624 (ref 7.350–7.450)
pO2, Arterial: 102 mmHg — ABNORMAL HIGH (ref 80.0–100.0)

## 2012-04-09 LAB — CBC
MCHC: 32.8 g/dL (ref 30.0–36.0)
Platelets: 276 10*3/uL (ref 150–400)
RDW: 17 % — ABNORMAL HIGH (ref 11.5–15.5)
WBC: 26.6 10*3/uL — ABNORMAL HIGH (ref 4.0–10.5)

## 2012-04-09 LAB — GLUCOSE, CAPILLARY
Glucose-Capillary: 120 mg/dL — ABNORMAL HIGH (ref 70–99)
Glucose-Capillary: 136 mg/dL — ABNORMAL HIGH (ref 70–99)

## 2012-04-09 LAB — BASIC METABOLIC PANEL
BUN: 76 mg/dL — ABNORMAL HIGH (ref 6–23)
Chloride: 106 mEq/L (ref 96–112)
Creatinine, Ser: 1.68 mg/dL — ABNORMAL HIGH (ref 0.50–1.35)
GFR calc Af Amer: 44 mL/min — ABNORMAL LOW (ref >90)
GFR calc non Af Amer: 38 mL/min — ABNORMAL LOW (ref >90)
Potassium: 3.2 mEq/L — ABNORMAL LOW (ref 3.5–5.1)

## 2012-04-09 LAB — PROTIME-INR: Prothrombin Time: 16.3 seconds — ABNORMAL HIGH (ref 11.6–15.2)

## 2012-04-09 LAB — APTT: aPTT: 38 seconds — ABNORMAL HIGH (ref 24–37)

## 2012-04-09 LAB — MAGNESIUM: Magnesium: 2.1 mg/dL (ref 1.5–2.5)

## 2012-04-09 MED ORDER — POTASSIUM CHLORIDE 20 MEQ/15ML (10%) PO LIQD
ORAL | Status: AC
  Filled 2012-04-09: qty 15

## 2012-04-09 MED ORDER — POTASSIUM CHLORIDE 20 MEQ/15ML (10%) PO LIQD
ORAL | Status: AC
  Administered 2012-04-09: 20 meq
  Filled 2012-04-09: qty 30

## 2012-04-09 MED ORDER — POTASSIUM CHLORIDE 20 MEQ/15ML (10%) PO LIQD
40.0000 meq | Freq: Once | ORAL | Status: AC
  Administered 2012-04-09: 40 meq

## 2012-04-09 NOTE — Progress Notes (Signed)
Inpatient Diabetes Program Recommendations  AACE/ADA: New Consensus Statement on Inpatient Glycemic Control (2013)  Target Ranges:  Prepandial:   less than 140 mg/dL      Peak postprandial:   less than 180 mg/dL (1-2 hours)      Critically ill patients:  140 - 180 mg/dL  Results for TRU, RANA (MRN [id number]) as of 04/09/2012 12:15  Ref. Range 04/08/2012 19:26 04/08/2012 23:25 04/09/2012 03:11 04/09/2012 09:28  Glucose-Capillary Latest Range: 70-99 mg/dL 119 (H) 147 (H) 829 (H) 239 (H)    Inpatient Diabetes Program Recommendations Insulin - Meal Coverage: Increase tube feed Novolog coverage to 5 units  Thank you  Piedad Climes [facility] Inpatient Diabetes Coordinator [phone]

## 2012-04-09 NOTE — Procedures (Signed)
Bronchoscopy Procedure Note ROLLYN SCIALDONE [id number] 28-Jan-1935  Procedure: Bronchoscopy Indications: tracheostomy placement   Procedure Details Consent: Risks of procedure as well as the alternatives and risks of each were explained to the (patient/caregiver).  Consent for procedure obtained. Time Out: Verified patient identification, verified procedure, site/side was marked, verified correct patient position, special equipment/implants available, medications/allergies/relevent history reviewed, required imaging and test results available.  Performed  In preparation for procedure, patient was given 100% FiO2 and bronchoscope lubricated. Sedation: Benzodiazepines, Muscle relaxants and Etomidate  Airway entered and the following bronchi were examined: RUL, RML, RLL, LUL, LLL and Bronchi.    Procedure done by Dr Molli Knock. At first bronch was introduce through ET tube and structures of tracheal rings, carina identified for operator of tracheostomy who was Dr Jonette Mate. Light of bronch passed through trachea and skin for indentification of tracheal rings for tracheostomy puncture. After this, under bronchoscopy guidance,  ET tube was pulled back sufficiently and very carefully. The ET tube was  pulled back enough to give room for tracheostomy operator and yet at same time to to ensure a secured airway. After this was accomplished, bronchoscope was withdrawn into the ET tube. After this,  Dr Molli Knock then performed tracheostomy under video visual provided by flexible video bronchoscopy. Followng introduction of tracheostomy,  the bronchoscope was removed from ET tube and introduced through tracheostomy. Correct position of tracheostomy was ensured, with enough room between carina and distal tracheostomy and no evidence of bleeding. The bronchoscope was then withdrawn. Respiratory therapist was then instructed to remove the ET tube.  Dr Molli Knock then proceeded to complete the tracheostomy with stay sutures   No  complications   Evaluation Hemodynamic Status: BP stable throughout; O2 sats: stable throughout Patient's Current Condition: stable Specimens:  None Complications: No apparent complications Patient did tolerate procedure well.   Mcarthur Rossetti. Tyson Alias, MD, FACP Pgr: [phone] [place] [facility]

## 2012-04-09 NOTE — Procedures (Signed)
Bedside Tracheostomy Insertion Procedure Note   Patient Details:   Name: Johnny [name] DOB: 01/09/1935 MRN: [id number]  Procedure: Tracheostomy  Pre Procedure Assessment: Bite block in place: Yes Breath Sounds: Clear  Post Procedure Assessment: BP 128/64  Pulse 75  Temp 98.6 F (37 C) (Oral)  Resp 15  Ht 5' 10.5" (1.791 m)  Wt 165 lb 9.1 oz (75.1 kg)  BMI 23.42 kg/m2  SpO2 100% O2 sats: stable throughout Complications: No apparent complications Patient did tolerate procedure well Tracheostomy Brand:Shiley Tracheostomy Style:Cuffed Tracheostomy Size: 8.0 Tracheostomy Secured WUJ:[id number] Tracheostomy Placement Confirmation:Trach cuff visualized and in place    Leonard Downing 04/09/2012, 2:36 PM

## 2012-04-09 NOTE — Progress Notes (Signed)
PULMONARY  / CRITICAL CARE MEDICINE  Name: Johnny [name] MRN: [id number] DOB: Sep 24, 1934    LOS: 8  REFERRING MD :  TRH  CHIEF COMPLAINT:  Alcohol withdrawal/HCAP >> Cardiac arrest  BRIEF PATIENT DESCRIPTION:  77 yo male former smoker admitted 04/01/2012 with shakes and incontinent of urine/feces and found to have pneumonia in setting of ETOH abuse/withdrawal.  Developed septic shock, and PEA on 12/13 with VDRF and 20 min before ROSC.  PCCM assumed care from 12/13.  Significant PMHx ETOH, DM type II, CAD s/p CABG, A fib, HTN, Hyperlipidemia, Low back pain   LINES / TUBES: 12/12 ETT>>>12/20. 12/20 Trach>>> 12/12 L IJ CVL >> 12/13 Lt radial Aline>>12/14  CULTURES: 12/12 resp >>GPC in pairs and candida albicans. 12/12 blood >>NTD 12/12 urine >>negative 12/12 influenza >negative 12/12 urine strep >> negative 12/12 urine leg >>negative  ANTIBIOTICS: 12/12 Vanc (HCAP) >>12/15 12/12 Cefepime (HCAP) >>12/15 12/12 Levaqin (HCAP) >>  SIGNIFICANT EVENTS:  12/12 admission and PEA arrest 12/14 Off pressors  TESTS: 12/12 CT head >> chronic microvascular ischemic changes 12/13 EEG >> moderate-to-severe generalized continuous slowing of cerebral activity 12/13 Echo >> EF 50 to 55% (tested while on dobutamine) 12/13 Abd u/s >> hepatic steatosis, medical renal disease, gallbladder sludge with small stones  LEVEL OF CARE:  ICU PRIMARY SERVICE:  PCCM CONSULTANTS:  None CODE STATUS: full  DIET:  Tube feeds DVT Px:  Lovenox GI Px:  Pantoprazole   INTERVAL HISTORY:  Open's eyes with stimulation.  Did not tolerate pressure support.  VITAL SIGNS: Temp:  [97.8 F (36.6 C)-102.8 F (39.3 C)] 98.9 F (37.2 C) (12/20 0800) Pulse Rate:  [74-127] 79  (12/20 0900) Resp:  [14-31] 21  (12/20 0900) BP: (89-153)/(49-87) 140/74 mmHg (12/20 0900) SpO2:  [57 %-100 %] 100 % (12/20 0900) FiO2 (%):  [29.5 %-35.7 %] 34 % (12/20 0900) Weight:  [75.1 kg (165 lb 9.1 oz)] 75.1 kg (165 lb 9.1 oz)  (12/20 0500) HEMODYNAMICS:   VENTILATOR SETTINGS: Vent Mode:  [-] PRVC FiO2 (%):  [29.5 %-35.7 %] 34 % Set Rate:  [14 bmp] 14 bmp Vt Set:  [400 mL-480 mL] 400 mL PEEP:  [4.8 cmH20-5 cmH20] 5 cmH20 Pressure Support:  [0 cmH20-10 cmH20] 0 cmH20 Plateau Pressure:  [16 cmH20-19 cmH20] 16 cmH20 INTAKE / OUTPUT: Intake/Output      12/19 0701 - 12/20 0700 12/20 0701 - 12/21 0700   I.V. (mL/kg) 1235.7 (16.5) 157 (2.1)   Other     NG/GT 983    IV Piggyback     Total Intake(mL/kg) 2218.7 (29.5) 157 (2.1)   Urine (mL/kg/hr) 1195 (0.7)    Stool 300 580   Total Output 1495 580   Net +723.7 -423        Urine Occurrence 2 x     PHYSICAL EXAMINATION: Gen: No distress, more alert and interactive, answers questions appropriately. HEENT: ETT in place, very poor dentition. PULM: Decreased breath sounds, no wheeze. CV: s1s2 regular, no murmur. AB: decreased bowel sounds, soft, non tender. Ext: no edema. Derm: excoriations around sacral region. Neuro: open's eyes with stimulation, and following commands.  Able to interact today and answering questions.  LABS: Cbc  Lab 04/09/12 0415 04/08/12 0350 04/07/12 0500  WBC 26.6* -- --  HGB 8.4* 9.6* 9.3*  HCT 25.6* 28.4* 27.1*  PLT 276 249 209   Chemistry  Lab 04/09/12 0415 04/08/12 0350 04/07/12 0500  NA 148* 144 145  K 3.2* 3.3* 3.0*  CL 106 99 98  CO2 32 36* 35*  BUN 76* 68* 56*  CREATININE 1.68* 1.66* 1.50*  CALCIUM 8.6 8.5 8.7  MG 2.1 2.3 1.6  PHOS 3.8 3.9 3.3  GLUCOSE 244* 150* 151*   Liver fxn  Lab 04/03/12 0500  AST 66*  ALT 33  ALKPHOS 80  BILITOT 0.4  PROT 5.3*  ALBUMIN 1.9*   coags  Lab 04/09/12 0415  APTT 38*  INR 1.34   Sepsis markers No results found for this basename: LATICACIDVEN:3,PROCALCITON:3 in the last 168 hours Cardiac markers  Lab 04/02/12 1522  CKTOTAL --  CKMB --  TROPONINI 0.34*   BNP No results found for this basename: PROBNP:3 in the last 168 hours ABG  Lab 04/09/12 0443 04/08/12  0346 04/07/12 0500  PHART 7.624* 7.527* 7.521*  PCO2ART 32.9* 40.3 40.7  PO2ART 102.0* 72.4* 68.5*  HCO3 34.1* 33.3* 33.0*  TCO2 35 34.5 34.3   CBG trend  Lab 04/09/12 0928 04/09/12 0311 04/08/12 2325 04/08/12 1926 04/08/12 1655  GLUCAP 239* 227* 148* 276* 189*    Intake/Output Summary (Last 24 hours) at 04/09/12 1022 Last data filed at 04/09/12 0900  Gross per 24 hour  Intake 2135.71 ml  Output   2075 ml  Net  60.71 ml   IMAGING:  Dg Chest Port 1 View  04/09/2012  *RADIOLOGY REPORT*  Clinical Data: Shortness of breath, evaluate endotracheal tube position  PORTABLE CHEST - 1 VIEW  Comparison: Portable chest x-ray of 04/08/2012  Findings: The tip of the endotracheal tube is approximately 3.9 cm above the carina.  There is little change in mild basilar atelectasis.  The left central venous line is unchanged in position and an NG tube coils in the fundus of the stomach.  Heart size is stable.  IMPRESSION: Tip of endotracheal tube 3.9 cm above carina.  No change in mild bibasilar linear atelectasis.   Original Report Authenticated By: Dwyane Dee, M.D.    Dg Chest Port 1 View  04/08/2012  *RADIOLOGY REPORT*  Clinical Data: Evaluate endotracheal tube position  PORTABLE CHEST - 1 VIEW  Comparison: Portable chest x-ray of 04/07/2012  Findings: The carina is very difficult to visualize in this patient.  The tip of the endotracheal tube is at least 4.4 cm above the carina.  No infiltrate or effusion is seen.  The heart is mildly enlarged.  Left central venous line tip overlies the region of the left innominate vein.  IMPRESSION:  1.  No active lung disease. 2.  The carina is difficult to visualize but the tip of the endotracheal tube is at least 4.4 cm above the carina.   Original Report Authenticated By: Dwyane Dee, M.D.    DIAGNOSES: Principal Problem:  *HCAP (healthcare-associated pneumonia) Active Problems:  DIABETES MELLITUS, TYPE II  HYPERTENSION  Alcohol dependence  ARF (acute renal  failure)  Volume depletion  Hyponatremia  Acute respiratory failure with hypoxia  Cardiac arrest  Septic shock(785.52)  Encephalopathy  ASSESSMENT / PLAN:  PULMONARY  ASSESSMENT: Acute respiratory failure in setting of PNA and cardiac arrest with hx of smoking. PLAN:   - Full vent support for today for trach. - PS after trach. - Trach today. - Hold diureses for today.  CARDIOVASCULAR  ASSESSMENT:  PEA. Likely related to pneumonia, acidosis, hypoxic respiratory failure in setting of ETOH withdrawal. Septic shock 2nd to PNA. Resolved 12/14. Hypervolemia. PLAN:  - Per cards. - Hold all antiHTN due to low BP and worsening renal function.  RENAL  ASSESSMENT:   Acute renal  failure 2nd to shock. Baseline creatinine 0.95 from 01/05/12.  Resolved 12/15. Lactic acidosis related to shock and liver dysfx. Resolved. Hypokalemia, hypomagnesemia, hypophosphatemia.  Intake/Output Summary (Last 24 hours) at 04/09/12 1022 Last data filed at 04/09/12 0900  Gross per 24 hour  Intake 2135.71 ml  Output   2075 ml  Net  60.71 ml   PLAN:   - Monitor renal fx, urine outpt. - Hold lasix for today to allow for renal function to normalize. - Electrolytes replacement as needed.  GASTROINTESTINAL  ASSESSMENT:   Protein calorie malnutrition with hx of ETOH. Fatty liver 2nd to ETOH. PLAN:   - Tube feeds while on vent but hold for trach placement today. - Thiamine, folic acid, MVI.  HEMATOLOGIC  ASSESSMENT:   Anemia, thrombocytopenia likely related to ETOH and chronic disease with critical illness. PLAN:  - F/u CBC. - Transfuse for Hb < 7.  INFECTIOUS Blood 12/15>>>NTD Sputum 12/14>>>Candida Urine 12/15>>>NTD ASSESSMENT:   Pneumonia PLAN:   D9/x levaquin, with the poor dentition, will continue for 14 days given severe sepsis, positive cultures and poor dentition. D/c vancomycin/cefepime 12/15 and continue monitor.  ENDOCRINE  ASSESSMENT:   DM type II. PLAN:    SSI  NEUROLOGIC  ASSESSMENT:   EtOH withdrawal. Acute encephalopathy 2nd to sepsis, hypoxia, and cardiac arrest with ?anoxic injury.  PLAN:   - Transition from precedex to PRN fentanyl after trach to avoid hypotensive effects.  Trach today then consult for placement.  Will need PEG next week.  Critical care time 35 minutes.  Alyson Reedy, M.D. [facility] Pulmonary/Critical Care Medicine. Pager: [phone]. After hours pager: [phone].

## 2012-04-09 NOTE — Procedures (Signed)
Percutaneous Tracheostomy Placement   Consent from daughter, patient sedated and paralyzed, area cleaned, lidocaine injected, skin incision done followed by blunt dissection, airway palpated, hooks used to raise airway and airway entered, catheter placed and visualized bronchoscopically, wire passed and visualized, airway crushed and dilated, trach placed and visualized bronchoscopically well above the carina, minimal blood loss. CXR ordered and pending.   Alyson Reedy, M.D. [facility] Pulmonary/Critical Care Medicine. Pager: [phone]. After hours pager: [phone].

## 2012-04-09 NOTE — Progress Notes (Signed)
eLink Physician-Brief Progress Note Patient Name: Johnny [name] DOB: 1934-04-29 MRN: [id number]  Date of Service  04/09/2012   HPI/Events of Note  Patient with resp alkalosis and hypokalemia   eICU Interventions  Decrease in vent TV Recheck ABG in 1 hour Potassium replaced   Intervention Category Intermediate Interventions: Other: (acid/base) Minor Interventions: Electrolytes abnormality - evaluation and management  [name] 04/09/2012, 6:43 AM

## 2012-04-10 ENCOUNTER — Inpatient Hospital Stay (HOSPITAL_COMMUNITY): Payer: Medicare PPO

## 2012-04-10 LAB — CBC
HCT: 24.8 % — ABNORMAL LOW (ref 39.0–52.0)
Hemoglobin: 8.4 g/dL — ABNORMAL LOW (ref 13.0–17.0)
MCH: 32.6 pg (ref 26.0–34.0)
MCV: 96.1 fL (ref 78.0–100.0)
Platelets: 313 10*3/uL (ref 150–400)
RBC: 2.58 MIL/uL — ABNORMAL LOW (ref 4.22–5.81)

## 2012-04-10 LAB — BASIC METABOLIC PANEL
BUN: 67 mg/dL — ABNORMAL HIGH (ref 6–23)
CO2: 29 mEq/L (ref 19–32)
Calcium: 9.1 mg/dL (ref 8.4–10.5)
Creatinine, Ser: 1.43 mg/dL — ABNORMAL HIGH (ref 0.50–1.35)
Glucose, Bld: 180 mg/dL — ABNORMAL HIGH (ref 70–99)

## 2012-04-10 LAB — POCT I-STAT 3, ART BLOOD GAS (G3+)
Bicarbonate: 30.3 mEq/L — ABNORMAL HIGH (ref 20.0–24.0)
TCO2: 31 mmol/L (ref 0–100)
pCO2 arterial: 27.2 mmHg — ABNORMAL LOW (ref 35.0–45.0)
pH, Arterial: 7.656 (ref 7.350–7.450)
pO2, Arterial: 94 mmHg (ref 80.0–100.0)

## 2012-04-10 LAB — CULTURE, BLOOD (ROUTINE X 2)

## 2012-04-10 LAB — GLUCOSE, CAPILLARY
Glucose-Capillary: 135 mg/dL — ABNORMAL HIGH (ref 70–99)
Glucose-Capillary: 139 mg/dL — ABNORMAL HIGH (ref 70–99)
Glucose-Capillary: 238 mg/dL — ABNORMAL HIGH (ref 70–99)

## 2012-04-10 MED ORDER — POTASSIUM CHLORIDE 20 MEQ/15ML (10%) PO LIQD
40.0000 meq | Freq: Once | ORAL | Status: AC
  Administered 2012-04-10: 40 meq
  Filled 2012-04-10: qty 30

## 2012-04-10 NOTE — Plan of Care (Signed)
Problem: Phase I Progression Outcomes Goal: Patient tolerating weaning plan Outcome: Progressing Pt. Currently on 10/5 pressure support via trach.  Precedex cut to 0.5 mcg/kg/h and pt. weaned to 5/5 on vent; however, pt. subsequently became restless and tachypneic with RR of 50.  Pt. Returned to 10/5 pressure support and precedex increased to 0.7 mcg/kg/h.  Patient is now calm with RR of 26.  Goal: Voiding-avoid urinary catheter unless indicated Outcome: Not Met (add Reason) Foley catheter in place.  Pt. Is incontinent and has a sacral wound.  Unable to successfully place condom catheter at this time.

## 2012-04-10 NOTE — Progress Notes (Signed)
PULMONARY  / CRITICAL CARE MEDICINE  Name: KANDEN CAREY MRN: [id number] DOB: March 09, 1935    LOS: 9  REFERRING MD :  TRH  CHIEF COMPLAINT:  Alcohol withdrawal/HCAP >> Cardiac arrest  BRIEF PATIENT DESCRIPTION:  77 yo male former smoker admitted 04/01/2012 with shakes and incontinent of urine/feces and found to have pneumonia in setting of ETOH abuse/withdrawal.  Developed septic shock, and PEA on 12/13 with VDRF and 20 min before ROSC.  PCCM assumed care from 12/13.  Significant PMHx ETOH, DM type II, CAD s/p CABG, A fib, HTN, Hyperlipidemia, Low back pain   LINES / TUBES: 12/12 ETT >>12/20 12/20 Tstomy 8 >> 12/12 L IJ CVL >> 12/13 Lt radial Aline>>12/14  CULTURES: 12/12 resp >>GPC in pairs and candida albicans. 12/12 blood >>NTD 12/15 >> coag neg staph  12/12 urine >>negative 12/12 influenza >negative 12/12 urine strep >> negative 12/12 urine leg >>negative  ANTIBIOTICS: 12/12 Vanc (HCAP) >>12/15 12/12 Cefepime (HCAP) >>12/15 12/12 Levaqin (HCAP) >>  SIGNIFICANT EVENTS:  12/12 admission and PEA arrest 12/14 Off pressors  TESTS: 12/12 CT head >> chronic microvascular ischemic changes 12/13 EEG >> moderate-to-severe generalized continuous slowing of cerebral activity 12/13 Echo >> EF 50 to 55% (tested while on dobutamine) 12/13 Abd u/s >> hepatic steatosis, medical renal disease, gallbladder sludge with small stones  LEVEL OF CARE:  ICU PRIMARY SERVICE:  PCCM CONSULTANTS:  None CODE STATUS: full  DIET:  Tube feeds DVT Px:  Lovenox GI Px:  Pantoprazole   INTERVAL HISTORY:  Febrile overnight Tolerating PS Calm on precedex  VITAL SIGNS: Temp:  [97.9 F (36.6 C)-101.6 F (38.7 C)] 98.9 F (37.2 C) (12/21 0800) Pulse Rate:  [26-100] 84  (12/21 0853) Resp:  [14-30] 28  (12/21 0853) BP: (106-140)/(39-90) 112/49 mmHg (12/21 0800) SpO2:  [98 %-100 %] 100 % (12/21 0853) FiO2 (%):  [29.4 %-100 %] 30 % (12/21 0853) Weight:  [73.8 kg (162 lb 11.2 oz)] 73.8 kg (162  lb 11.2 oz) (12/21 0433) HEMODYNAMICS: CVP:  [5 mmHg] 5 mmHg VENTILATOR SETTINGS: Vent Mode:  [-] CPAP FiO2 (%):  [29.4 %-100 %] 30 % Set Rate:  [10 bmp-16 bmp] 10 bmp Vt Set:  [400 mL-500 mL] 400 mL PEEP:  [5 cmH20] 5 cmH20 Pressure Support:  [5 cmH20] 5 cmH20 Plateau Pressure:  [13 cmH20-20 cmH20] 16 cmH20 INTAKE / OUTPUT: Intake/Output      12/20 0701 - 12/21 0700 12/21 0701 - 12/22 0700   I.V. (mL/kg) 998.9 (13.5)    NG/GT 900    Total Intake(mL/kg) 1898.9 (25.7)    Urine (mL/kg/hr) 1600 (0.9)    Stool 580    Total Output 2180    Net -281.1          PHYSICAL EXAMINATION: Gen: No distress, more alert and interactive. HEENT: 8 tstomy, very poor dentition. PULM: Decreased breath sounds, no wheeze. CV: s1s2 regular, no murmur. AB: decreased bowel sounds, soft, non tender. Ext: no edema. Derm: excoriations around sacral region. Neuro:  following commands.  Non focal  LABS: Cbc  Lab 04/10/12 0345 04/09/12 0415 04/08/12 0350  WBC 16.9* -- --  HGB 8.4* 8.4* 9.6*  HCT 24.8* 25.6* 28.4*  PLT 313 276 249   Chemistry  Lab 04/10/12 0345 04/09/12 0415 04/08/12 0350  NA 148* 148* 144  K 3.4* 3.2* 3.3*  CL 108 106 99  CO2 29 32 36*  BUN 67* 76* 68*  CREATININE 1.43* 1.68* 1.66*  CALCIUM 9.1 8.6 8.5  MG 2.0 2.1  2.3  PHOS 2.9 3.8 3.9  GLUCOSE 180* 244* 150*   Liver fxn No results found for this basename: AST:3,ALT:3,ALKPHOS:3,BILITOT:3,PROT:3,ALBUMIN:3 in the last 168 hours coags  Lab 04/09/12 0415  APTT 38*  INR 1.34   Sepsis markers No results found for this basename: LATICACIDVEN:3,PROCALCITON:3 in the last 168 hours Cardiac markers No results found for this basename: CKTOTAL:3,CKMB:3,TROPONINI:3 in the last 168 hours BNP No results found for this basename: PROBNP:3 in the last 168 hours ABG  Lab 04/10/12 0416 04/09/12 0443 04/08/12 0346  PHART 7.656* 7.624* 7.527*  PCO2ART 27.2* 32.9* 40.3  PO2ART 94.0 102.0* 72.4*  HCO3 30.3* 34.1* 33.3*  TCO2 31 35  34.5   CBG trend  Lab 04/10/12 0759 04/10/12 0308 04/09/12 2309 04/09/12 1954 04/09/12 1623  GLUCAP 139* 135* 136* 120* 214*    Intake/Output Summary (Last 24 hours) at 04/10/12 0859 Last data filed at 04/10/12 0700  Gross per 24 hour  Intake 1820.4 ml  Output   1600 ml  Net  220.4 ml   IMAGING:  Dg Chest Port 1 View  04/10/2012  *RADIOLOGY REPORT*  Clinical Data: Evaluate endotracheal tube position  PORTABLE CHEST - 1 VIEW  Comparison: Portable chest x-ray of 04/09/2012  Findings: A tracheostomy and right PICC line remain. Aeration has improved slightly.  There is persistent right basilar atelectasis. Heart size is stable.  IMPRESSION: Slightly better aeration.  Right basilar atelectasis remains.   Original Report Authenticated By: Dwyane Dee, M.D.    Chest Portable 1 View To Assess Tube Placement And Rule-out Pneumothorax  04/09/2012  *RADIOLOGY REPORT*  Clinical Data: 77 year old male status post tracheostomy placement.  PORTABLE CHEST - 1 VIEW  Comparison: 0532 hours the same day and earlier.  Findings: AP portable semi upright view 1435 hours.  Endotracheal tube and enteric tube removed.  Tracheostomy tube now in place. Positioning appears appropriate.  Stable left IJ and right subclavian central line.  Slightly lower lung volumes.  Mildly increased patchy bibasilar opacity.  No pneumothorax.  IMPRESSION: 1.  Tracheostomy tube in place with no adverse features. 2. Otherwise, stable lines and tubes. 3.  Lower lung volumes.   Original Report Authenticated By: [facility] Speed, M.D.    Dg Chest Port 1 View  04/09/2012  *RADIOLOGY REPORT*  Clinical Data: Shortness of breath, evaluate endotracheal tube position  PORTABLE CHEST - 1 VIEW  Comparison: Portable chest x-ray of 04/08/2012  Findings: The tip of the endotracheal tube is approximately 3.9 cm above the carina.  There is little change in mild basilar atelectasis.  The left central venous line is unchanged in position and an NG tube coils  in the fundus of the stomach.  Heart size is stable.  IMPRESSION: Tip of endotracheal tube 3.9 cm above carina.  No change in mild bibasilar linear atelectasis.   Original Report Authenticated By: Dwyane Dee, M.D.    DIAGNOSES: Principal Problem:  *HCAP (healthcare-associated pneumonia) Active Problems:  DIABETES MELLITUS, TYPE II  HYPERTENSION  Alcohol dependence  ARF (acute renal failure)  Volume depletion  Hyponatremia  Acute respiratory failure with hypoxia  Cardiac arrest  Septic shock(785.52)  Encephalopathy  ASSESSMENT / PLAN:  PULMONARY  ASSESSMENT: Acute respiratory failure in setting of PNA and cardiac arrest with hx of smoking. Tstomy  PLAN:   - Progress to Trach collar   CARDIOVASCULAR  ASSESSMENT:  PEA. Likely related to pneumonia, acidosis, hypoxic respiratory failure in setting of ETOH withdrawal. Septic shock 2nd to PNA. Resolved 12/14. Hypervolemia. PLAN:  -  Per cards. - Hold all antiHTN due to low BP and worsening renal function.  RENAL  ASSESSMENT:   Acute renal failure 2nd to shock. Baseline creatinine 0.95 from 01/05/12.  Lactic acidosis related to shock and liver dysfx. Resolved. Hypokalemia, hypomagnesemia, hypophosphatemia.  Intake/Output Summary (Last 24 hours) at 04/10/12 0859 Last data filed at 04/10/12 0700  Gross per 24 hour  Intake 1820.4 ml  Output   1600 ml  Net  220.4 ml   PLAN:   - Hold lasix to allow for renal function to normalize. -replace K  GASTROINTESTINAL  ASSESSMENT:   Protein calorie malnutrition with hx of ETOH. Fatty liver 2nd to ETOH. PLAN:   - Tube feeds while on vent but hold for trach placement in AM. - Thiamine, folic acid, MVI.  HEMATOLOGIC  ASSESSMENT:   Anemia, thrombocytopenia likely related to ETOH and chronic disease with critical illness. PLAN:  - F/u CBC. - Transfuse for Hb < 7.  INFECTIOUS Blood 12/15>>>NTD Sputum 12/14>>>Candida Urine 12/15>>>NTD ASSESSMENT:   Pneumonia PLAN:    D9/ 10 x levaquin, with the poor dentition, will need tooth extraction. D/c'd vancomycin/cefepime 12/15  New fever 12/21- with neg cx & decreasing WC, dc foley & observe  ENDOCRINE  ASSESSMENT:   DM type II. PLAN:   SSI  NEUROLOGIC  ASSESSMENT:   EtOH withdrawal. Acute encephalopathy 2nd to sepsis, hypoxia, and cardiac arrest with ?anoxic injury.  PLAN:   Changed to fentanyl and precedex with good response, taper precedex to off.  GLOBAL - plan for LTAC ?  Critical care time 35 minutes.  Cyril Mourning MD. Tonny Bollman. [facility] Pager [phone] If no response call [phone]

## 2012-04-10 NOTE — Progress Notes (Signed)
eLink Physician-Brief Progress Note Patient Name: OMARRION CARMER DOB: 03/28/1935 MRN: [id number]  Date of Service  04/10/2012   HPI/Events of Note  Vent adjusted for severe resp alkalosis  eICU Interventions  See orders   Intervention Category Major Interventions: Respiratory failure - evaluation and management;Acid-Base disturbance - evaluation and management  Shan Levans 04/10/2012, 5:35 AM

## 2012-04-11 DIAGNOSIS — Z930 Tracheostomy status: Secondary | ICD-10-CM

## 2012-04-11 DIAGNOSIS — E43 Unspecified severe protein-calorie malnutrition: Secondary | ICD-10-CM

## 2012-04-11 LAB — CBC
Hemoglobin: 7.7 g/dL — ABNORMAL LOW (ref 13.0–17.0)
MCH: 31.7 pg (ref 26.0–34.0)
MCHC: 32.4 g/dL (ref 30.0–36.0)
Platelets: 346 10*3/uL (ref 150–400)
RBC: 2.43 MIL/uL — ABNORMAL LOW (ref 4.22–5.81)

## 2012-04-11 LAB — BASIC METABOLIC PANEL
BUN: 57 mg/dL — ABNORMAL HIGH (ref 6–23)
Calcium: 8.7 mg/dL (ref 8.4–10.5)
GFR calc Af Amer: 63 mL/min — ABNORMAL LOW (ref >90)
GFR calc non Af Amer: 55 mL/min — ABNORMAL LOW (ref >90)
Potassium: 3.6 mEq/L (ref 3.5–5.1)
Sodium: 152 mEq/L — ABNORMAL HIGH (ref 135–145)

## 2012-04-11 LAB — GLUCOSE, CAPILLARY
Glucose-Capillary: 115 mg/dL — ABNORMAL HIGH (ref 70–99)
Glucose-Capillary: 205 mg/dL — ABNORMAL HIGH (ref 70–99)
Glucose-Capillary: 284 mg/dL — ABNORMAL HIGH (ref 70–99)

## 2012-04-11 MED ORDER — HALOPERIDOL LACTATE 5 MG/ML IJ SOLN
5.0000 mg | Freq: Four times a day (QID) | INTRAMUSCULAR | Status: DC | PRN
Start: 2012-04-11 — End: 2012-04-11
  Filled 2012-04-11: qty 1

## 2012-04-11 MED ORDER — HALOPERIDOL LACTATE 5 MG/ML IJ SOLN
INTRAMUSCULAR | Status: AC
  Administered 2012-04-11: 5 mg
  Filled 2012-04-11: qty 1

## 2012-04-11 MED ORDER — HALOPERIDOL LACTATE 5 MG/ML IJ SOLN
5.0000 mg | Freq: Once | INTRAMUSCULAR | Status: AC
  Administered 2012-04-11: 5 mg via INTRAVENOUS

## 2012-04-11 MED ORDER — LORAZEPAM 2 MG/ML IJ SOLN
2.0000 mg | INTRAMUSCULAR | Status: DC | PRN
  Administered 2012-04-13 – 2012-04-27 (×12): 2 mg via INTRAVENOUS
  Filled 2012-04-11 (×13): qty 1

## 2012-04-11 MED ORDER — LORAZEPAM 2 MG/ML IJ SOLN
INTRAMUSCULAR | Status: AC
  Administered 2012-04-11: 03:00:00
  Filled 2012-04-11: qty 1

## 2012-04-11 NOTE — Progress Notes (Signed)
eLink Physician-Brief Progress Note Patient Name: Johnny [name] DOB: 1935/03/05 MRN: [id number]  Date of Service  04/11/2012   HPI/Events of Note   Agitation while on max Precedex.  eICU Interventions   Haldol 5 mg IV x 1   Intervention Category Major Interventions: Delirium, psychosis, severe agitation - evaluation and management  Leemon Ayala 04/11/2012, 1:07 AM

## 2012-04-11 NOTE — Progress Notes (Signed)
PULMONARY  / CRITICAL CARE MEDICINE  Name: Johnny [name] MRN: [id number] DOB: [birth year]    LOS: 10  REFERRING MD :  TRH  CHIEF COMPLAINT:  Alcohol withdrawal/HCAP >> Cardiac arrest  BRIEF PATIENT DESCRIPTION:  77 yo male former smoker admitted 04/01/2012 with shakes and incontinent of urine/feces and found to have pneumonia in setting of ETOH abuse/withdrawal.  Developed septic shock, and PEA on 12/13 with VDRF and 20 min before ROSC.  PCCM assumed care from 12/13.   LINES / TUBES: 12/12 ETT >>12/20 12/20 Tstomy 8 >> 12/12 L IJ CVL >> 12/13 Lt radial Aline>>12/14  CULTURES: 12/12 resp >>GPC in pairs and candida albicans. 12/12 blood >>NTD 12/15 >> coag neg staph  12/12 urine >>negative 12/12 influenza >negative 12/12 urine strep >> negative 12/12 urine leg >>negative  ANTIBIOTICS: 12/12 Vanc (HCAP) >>12/15 12/12 Cefepime (HCAP) >>12/15 12/12 Levaqin (HCAP) >>  SIGNIFICANT EVENTS:  12/12 admission and PEA arrest 12/14 Off pressors 12/20 Trach  TESTS: 12/12 CT head >> chronic microvascular ischemic changes 12/13 EEG >> moderate-to-severe generalized continuous slowing of cerebral activity 12/13 Echo >> EF 50 to 55% (tested while on dobutamine) 12/13 Abd u/s >> hepatic steatosis, medical renal disease, gallbladder sludge with small stones  LEVEL OF CARE:  ICU PRIMARY SERVICE:  PCCM CONSULTANTS:  None CODE STATUS: full  DIET:  Tube feeds DVT Px:  Lovenox GI Px:  Pantoprazole   INTERVAL HISTORY:  Temp curve better  Weaning on ps 10/5 VITAL SIGNS: Temp:  [98.2 F (36.8 C)-99.8 F (37.7 C)] 98.2 F (36.8 C) (12/22 0800) Pulse Rate:  [80-118] 85  (12/22 1000) Resp:  [15-35] 17  (12/22 1000) BP: (96-143)/(39-82) 110/57 mmHg (12/22 1000) SpO2:  [99 %-100 %] 100 % (12/22 1000) FiO2 (%):  [29.6 %-30.5 %] 30.3 % (12/22 1000) Weight:  [72.4 kg (159 lb 9.8 oz)] 72.4 kg (159 lb 9.8 oz) (12/22 0500) HEMODYNAMICS: CVP:  [5 mmHg-7 mmHg] 5 mmHg VENTILATOR  SETTINGS: Vent Mode:  [-] PRVC FiO2 (%):  [29.6 %-30.5 %] 30.3 % Set Rate:  [10 bmp] 10 bmp Vt Set:  [400 mL] 400 mL PEEP:  [5 cmH20] 5 cmH20 Plateau Pressure:  [11 cmH20-14 cmH20] 14 cmH20 INTAKE / OUTPUT: Intake/Output      12/21 0701 - 12/22 0700 12/22 0701 - 12/23 0700   I.V. (mL/kg) 814.7 (11.3) 95.5 (1.3)   NG/GT 780 180   Total Intake(mL/kg) 1594.7 (22) 275.5 (3.8)   Urine (mL/kg/hr) 1350 (0.8)    Stool     Total Output 1350    Net +244.7 +275.5        Urine Occurrence 1 x 2 x    PHYSICAL EXAMINATION: Gen: No distress, sedated this am. HEENT: 8 tstomy, very poor dentition. PULM: Decreased breath sounds, no wheeze. CV: s1s2 regular, no murmur. AB: decreased bowel sounds, soft, non tender. Ext: no edema. Derm: excoriations around sacral region. Neuro:  following commands.  Non focal  LABS: Cbc  Lab 04/11/12 0344 04/10/12 0345 04/09/12 0415  WBC 16.6* -- --  HGB 7.7* 8.4* 8.4*  HCT 23.8* 24.8* 25.6*  PLT 346 313 276   Chemistry  Lab 04/11/12 0344 04/10/12 0345 04/09/12 0415 04/08/12 0350  NA 152* 148* 148* --  K 3.6 3.4* 3.2* --  CL 113* 108 106 --  CO2 29 29 32 --  BUN 57* 67* 76* --  CREATININE 1.25 1.43* 1.68* --  CALCIUM 8.7 9.1 8.6 --  MG -- 2.0 2.1 2.3  PHOS --  2.9 3.8 3.9  GLUCOSE 205* 180* 244* --   Liver fxn No results found for this basename: AST:3,ALT:3,ALKPHOS:3,BILITOT:3,PROT:3,ALBUMIN:3 in the last 168 hours coags  Lab 04/09/12 0415  APTT 38*  INR 1.34   Sepsis markers No results found for this basename: LATICACIDVEN:3,PROCALCITON:3 in the last 168 hours Cardiac markers No results found for this basename: CKTOTAL:3,CKMB:3,TROPONINI:3 in the last 168 hours BNP No results found for this basename: PROBNP:3 in the last 168 hours ABG  Lab 04/10/12 0416 04/09/12 0443 04/08/12 0346  PHART 7.656* 7.624* 7.527*  PCO2ART 27.2* 32.9* 40.3  PO2ART 94.0 102.0* 72.4*  HCO3 30.3* 34.1* 33.3*  TCO2 31 35 34.5   CBG trend  Lab 04/11/12  0353 04/11/12 0002 04/10/12 1946 04/10/12 1649 04/10/12 1140  GLUCAP 171* 115* 205* 277* 238*    Intake/Output Summary (Last 24 hours) at 04/11/12 1011 Last data filed at 04/11/12 1000  Gross per 24 hour  Intake 1764.87 ml  Output   1150 ml  Net 614.87 ml   IMAGING:  Dg Chest Port 1 View  04/10/2012  *RADIOLOGY REPORT*  Clinical Data: Evaluate endotracheal tube position  PORTABLE CHEST - 1 VIEW  Comparison: Portable chest x-ray of 04/09/2012  Findings: A tracheostomy and right PICC line remain. Aeration has improved slightly.  There is persistent right basilar atelectasis. Heart size is stable.  IMPRESSION: Slightly better aeration.  Right basilar atelectasis remains.   Original Report Authenticated By: Dwyane Dee, M.D.    Chest Portable 1 View To Assess Tube Placement And Rule-out Pneumothorax  04/09/2012  *RADIOLOGY REPORT*  Clinical Data: 77 year old male status post tracheostomy placement.  PORTABLE CHEST - 1 VIEW  Comparison: 0532 hours the same day and earlier.  Findings: AP portable semi upright view 1435 hours.  Endotracheal tube and enteric tube removed.  Tracheostomy tube now in place. Positioning appears appropriate.  Stable left IJ and right subclavian central line.  Slightly lower lung volumes.  Mildly increased patchy bibasilar opacity.  No pneumothorax.  IMPRESSION: 1.  Tracheostomy tube in place with no adverse features. 2. Otherwise, stable lines and tubes. 3.  Lower lung volumes.   Original Report Authenticated By: Erskine Speed, M.D.    DIAGNOSES: Principal Problem:  *HCAP (healthcare-associated pneumonia) Active Problems:  DIABETES MELLITUS, TYPE II  HYPERTENSION  Alcohol dependence  ARF (acute renal failure)  Volume depletion  Hyponatremia  Acute respiratory failure with hypoxia  Cardiac arrest  Septic shock(785.52)  Encephalopathy  Tracheostomy status  Protein-calorie malnutrition, severe  ASSESSMENT / PLAN:  PULMONARY  ASSESSMENT: Acute respiratory  failure in setting of PNA and cardiac arrest with hx of smoking. Tstomy  PLAN:   - Progress to Trach collar -refer to LTAC   CARDIOVASCULAR  ASSESSMENT:  PEA. Likely related to pneumonia, acidosis, hypoxic respiratory failure in setting of ETOH withdrawal. Septic shock 2nd to PNA. Resolved 12/14. Hypervolemia. PLAN:  - Per cards. - cont to hold all antiHTN due to low BP and worsening renal function.  RENAL  ASSESSMENT:   Acute renal failure 2nd to shock. Baseline creatinine 0.95 from 01/05/12.  Lactic acidosis related to shock and liver dysfx. Resolved. Hypokalemia, hypomagnesemia, hypophosphatemia.  Intake/Output Summary (Last 24 hours) at 04/11/12 1011 Last data filed at 04/11/12 1000  Gross per 24 hour  Intake 1764.87 ml  Output   1150 ml  Net 614.87 ml   PLAN:   - Hold lasix to allow for renal function to normalize.   GASTROINTESTINAL  ASSESSMENT:   Protein calorie malnutrition with  hx of ETOH. Fatty liver 2nd to ETOH. PLAN:   - Tube feeds while on vent  - Thiamine, folic acid, MVI.  HEMATOLOGIC  ASSESSMENT:   Anemia, thrombocytopenia likely related to ETOH and chronic disease with critical illness. PLAN:  - F/u CBC. - Transfuse for Hb < 7.  INFECTIOUS Blood 12/15>>>NTD Sputum 12/14>>>Candida Urine 12/15>>>NTD ASSESSMENT:   Pneumonia New fever 12/21 >>observe PLAN:   D10/ 10 x levaquin, with the poor dentition, will need tooth extraction. D/c'd vancomycin/cefepime 12/15   ENDOCRINE  ASSESSMENT:   DM type II. PLAN:   [facility]  NEUROLOGIC  ASSESSMENT:   EtOH withdrawal. Acute encephalopathy 2nd to sepsis, hypoxia, and cardiac arrest with ?anoxic injury.  PLAN:   Changed to fentanyl and precedex with good response, taper precedex to off.  GLOBAL - plan for LTAC ?>>refer to [facility]  Critical care time 35 minutes.  Dorcas Carrow Beeper  [phone]  Cell  [phone]  If no response or cell goes to voicemail, call beeper  [phone]  04/11/2012 10:12AM

## 2012-04-12 ENCOUNTER — Inpatient Hospital Stay (HOSPITAL_COMMUNITY): Payer: Medicare PPO

## 2012-04-12 LAB — CBC
HCT: 23.9 % — ABNORMAL LOW (ref 39.0–52.0)
MCHC: 33.1 g/dL (ref 30.0–36.0)
MCV: 98.4 fL (ref 78.0–100.0)
RDW: 17.3 % — ABNORMAL HIGH (ref 11.5–15.5)

## 2012-04-12 LAB — BASIC METABOLIC PANEL
BUN: 54 mg/dL — ABNORMAL HIGH (ref 6–23)
Creatinine, Ser: 1.21 mg/dL (ref 0.50–1.35)
GFR calc Af Amer: 66 mL/min — ABNORMAL LOW (ref >90)
GFR calc non Af Amer: 57 mL/min — ABNORMAL LOW (ref >90)
Potassium: 3.3 mEq/L — ABNORMAL LOW (ref 3.5–5.1)

## 2012-04-12 LAB — GLUCOSE, CAPILLARY
Glucose-Capillary: 147 mg/dL — ABNORMAL HIGH (ref 70–99)
Glucose-Capillary: 180 mg/dL — ABNORMAL HIGH (ref 70–99)

## 2012-04-12 MED ORDER — DEXMEDETOMIDINE HCL IN NACL 400 MCG/100ML IV SOLN
0.4000 ug/kg/h | INTRAVENOUS | Status: DC
  Administered 2012-04-12 (×3): 1 ug/kg/h via INTRAVENOUS
  Administered 2012-04-12 – 2012-04-13 (×3): 0.6 ug/kg/h via INTRAVENOUS
  Filled 2012-04-12 (×7): qty 100

## 2012-04-12 MED ORDER — CEFAZOLIN SODIUM 1-5 GM-% IV SOLN
1.0000 g | Freq: Once | INTRAVENOUS | Status: AC
  Administered 2012-04-13: 1 g via INTRAVENOUS
  Filled 2012-04-12: qty 50

## 2012-04-12 MED ORDER — HEPARIN SODIUM (PORCINE) 5000 UNIT/ML IJ SOLN
5000.0000 [IU] | Freq: Three times a day (TID) | INTRAMUSCULAR | Status: DC
  Administered 2012-04-12 – 2012-05-07 (×73): 5000 [IU] via SUBCUTANEOUS
  Filled 2012-04-12 (×77): qty 1

## 2012-04-12 NOTE — Progress Notes (Signed)
Respiratory therapy note- patient is requiring increased VT support, increased PS+14, tolerating wean better.

## 2012-04-12 NOTE — Progress Notes (Signed)
Nursing [phone] Patient being weaned on trach collar.  Precedex gtt stopped at 0800.  Patient calm and resting at this time. At 0845, patient assessed by RN and found to have shallow breaths.  RT called to assess.  RN cleaning up patient after incontinent episode and patient nods that he is having difficulty breathing and his oxygen saturations were decreasing on the monitor.  Patient agitated and Precedex gtt resumed.   Patient sat up in bed and RT to room to place patient back on the ventilator.  Will continue to assess.

## 2012-04-12 NOTE — Progress Notes (Signed)
Respiratory therapy note- patient placed on ATC tolerating well until precedex turned off, patient very anxious and condom catheter came off and required to be cleaned up and laid flat. HR-130's RR-30's, unable to get sp02 wave form. Precedex turned back on and patient placed back to c/ps. Cont. to monitor for wean.

## 2012-04-12 NOTE — Progress Notes (Signed)
Trach team note Chart reviewed/trach team.  Pt with difficulty weaning to TC; back on PS; referral to LTAC. SLP available should our services be warranted.  Maddyx Vallie L. Samson Frederic, [place] CCC/SLP Pager [phone]

## 2012-04-12 NOTE — Consult Note (Signed)
Referring Provider: Dr.Yacoub Primary Care Physician:  Rogelia Boga, MD Primary Gastroenterologist:  None   Reason for Consultation:  PEG placement  HPI: Johnny [name] is a 77 y.o. male admitted to the hospital about 2 weeks ago after being found at home after several days of alcoholic intoxication, incontinent of feces and urine, with pneumonia that progressed to hypoxic, acidotic respiratory failure and cardiac arrest day after admission, requiring about 20 minutes of CPR prior to return of spontaneous circulation. Since that time, his mental status has been clouded and he has been on tube feedings. His feeding tube came out today and could not readily be replaced. In view of anticipated intermediate or long-term need for supplemental enteral nutrition, PEG placement has been requested by the pulmonary critical care service.  Past Medical History  Diagnosis Date  . Acute alcoholic hepatitis 05/20/2010  . Anemia of other chronic disease 08/16/2007  . Atrial fibrillation 08/12/2007  . CORONARY ARTERY DISEASE 12/11/2006  . HYPERTENSION 12/11/2006  . Hyperlipidemia   . ETOH abuse   . Chronic low back pain   . Pneumonia     "now; never before" (04/01/2012)  . Hallucination 04/01/2012  . Exertional dyspnea   . DIABETES MELLITUS, TYPE II 12/11/2006  . Arthritis     "think that's what's in my back" (04/01/2012)  . Altered mental status 04/01/2012  . Seizures 04/01/2012    seizure-like activity @ home today/notes 04/01/2012     Past Surgical History  Procedure Date  . Coronary artery bypass graft ~ 2003    CABG X4  . Cataract extraction w/ intraocular lens  implant, bilateral     "years ago" (04/01/2012)    Prior to Admission medications   Medication Sig Start Date End Date Taking? Authorizing Provider  acetaminophen (TYLENOL) 500 MG tablet Take 1,000 mg by mouth every 6 (six) hours as needed. For pain   Yes Historical Provider, MD  clonazePAM (KLONOPIN) 0.5 MG tablet Take 1  tablet (0.5 mg total) by mouth 2 (two) times daily. 01/05/12  Yes Vassie Loll, MD  folic acid (FOLVITE) 1 MG tablet Take 1 tablet (1 mg total) by mouth daily. 01/05/12  Yes Vassie Loll, MD  glipiZIDE (GLUCOTROL) 5 MG tablet Take 5 mg by mouth 2 (two) times daily before a meal.   Yes Historical Provider, MD  isosorbide mononitrate (IMDUR) 30 MG 24 hr tablet Take 1 tablet (30 mg total) by mouth daily. 01/05/12  Yes Vassie Loll, MD  labetalol (NORMODYNE) 300 MG tablet Take 1 tablet (300 mg total) by mouth 2 (two) times daily. 01/16/12  Yes Gordy Savers, MD  magnesium oxide (MAG-OX) 400 (241.3 MG) MG tablet Take 1 tablet (400 mg total) by mouth daily. 01/05/12  Yes Vassie Loll, MD  thiamine 100 MG tablet Take 1 tablet (100 mg total) by mouth daily. 01/05/12  Yes Vassie Loll, MD    Current Facility-Administered Medications  Medication Dose Route Frequency Provider Last Rate Last Dose  . 0.9 %  sodium chloride infusion   Intravenous Continuous Coralyn Helling, MD 10 mL/hr at 04/12/12 1400    . acetaminophen (TYLENOL) solution 650 mg  650 mg Per Tube Q6H PRN Lonia Farber, MD   650 mg at 04/10/12 1214  . antiseptic oral rinse (BIOTENE) solution 15 mL  15 mL Mouth Rinse QID Zigmund Gottron, MD   15 mL at 04/12/12 1200  . chlorhexidine (PERIDEX) 0.12 % solution 15 mL  15 mL Mouth Rinse BID Lupita Leash, MD  15 mL at 04/12/12 0748  . dexmedetomidine (PRECEDEX) 400 MCG/100ML infusion  0.4-1.2 mcg/kg/hr (Order-Specific) Intravenous Titrated Lupita Leash, MD 18.5 mL/hr at 04/12/12 1400 1 mcg/kg/hr at 04/12/12 1400  . dextrose 10 % infusion   Intravenous Continuous PRN Zigmund Gottron, MD 40 mL/hr at 04/08/12 2333    . feeding supplement (JEVITY 1.2 CAL) liquid 1,000 mL  1,000 mL Per Tube Continuous Tonye Becket, RD 60 mL/hr at 04/09/12 1600 1,000 mL at 04/09/12 1600  . feeding supplement (PRO-STAT SUGAR FREE 64) liquid 30 mL  30 mL Per Tube TID Tonye Becket, RD    30 mL at 04/11/12 2118  . fentaNYL (SUBLIMAZE) injection 25-50 mcg  25-50 mcg Intravenous Q2H PRN Alyson Reedy, MD   50 mcg at 04/12/12 1000  . folic acid (FOLVITE) tablet 1 mg  1 mg Oral Daily Adeline C Viyuoh, MD   1 mg at 04/11/12 1028  . Gerhardt's butt cream   Topical PRN Alyson Reedy, MD   1 application at 04/06/12 2019  . heparin injection 5,000 Units  5,000 Units Subcutaneous Q8H Alyson Reedy, MD   5,000 Units at 04/12/12 1334  . insulin aspart (novoLOG) injection 0-15 Units  0-15 Units Subcutaneous Q4H Roxine Caddy, MD   3 Units at 04/12/12 1135  . insulin aspart (novoLOG) injection 4 Units  4 Units Subcutaneous Q4H Zigmund Gottron, MD   4 Units at 04/12/12 [phone]  . LORazepam (ATIVAN) injection 2 mg  2 mg Intravenous Q4H PRN Lonia Farber, MD      . multivitamin liquid 5 mL  5 mL Oral Daily Tonye Becket, RD   5 mL at 04/11/12 1028  . ondansetron (ZOFRAN) injection 4 mg  4 mg Intravenous Q4H PRN Kela Millin, MD   4 mg at 04/06/12 1550  . pantoprazole sodium (PROTONIX) 40 mg/20 mL oral suspension 40 mg  40 mg Per Tube Q24H Coralyn Helling, MD   40 mg at 04/11/12 1208  . thiamine (VITAMIN B-1) tablet 100 mg  100 mg Oral Daily Kela Millin, MD   100 mg at 04/11/12 1028    Allergies as of 04/01/2012  . (No Known Allergies)    Family History  Problem Relation Age of Onset  . Heart failure Mother   . Aneurysm Sister   . Suicidality Brother   . Heart failure Brother   . Diabetes Sister     History   Social History  . Marital Status: Married    Spouse Name: N/A    Number of Children: N/A  . Years of Education: N/A   Occupational History  . Not on file.   Social History Main Topics  . Smoking status: Former Smoker -- 0.5 packs/day for 17 years    Types: Cigarettes    Quit date: 04/22/1983  . Smokeless tobacco: Never Used     Comment: 04/01/2012 "quit smoking 20+ yr ago"  . Alcohol Use: 10.8 oz/week    2 Glasses of wine, 16 Shots of liquor per  week     Comment: hx of ETOH abuse; 04/01/2012 "drink ~ 1 pint//wk; vodka; cup of wine/wk"   . Drug Use: No  . Sexually Active: No   Other Topics Concern  . Not on file   Social History Narrative  . No narrative on file    Review of Systems: Unobtainable Physical Exam: Vital signs in last 24 hours: Temp:  [99.7 F (37.6 C)-100.2 F (37.9 C)]  100.2 F (37.9 C) (12/23 1137) Pulse Rate:  [79-122] 96  (12/23 1400) Resp:  [15-30] 23  (12/23 1400) BP: (102-147)/(54-78) 139/74 mmHg (12/23 1400) SpO2:  [100 %] 100 % (12/23 1400) FiO2 (%):  [29.6 %-30.5 %] 29.9 % (12/23 1400) Weight:  [75.2 kg (165 lb 12.6 oz)] 75.2 kg (165 lb 12.6 oz) (12/23 0400) Last BM Date: 04/11/12 General:  The patient is sitting up in bed on a ventilator, through a trach tube. He is alert, and follows simple commands and seems to understand questions as evidenced by nodding his head. Head:  Normocephalic and atraumatic. Eyes:  Sclera clear, no icterus.    Neck:  S/p tracheostomy Lungs:  Clear throughout to auscultation.   No wheezes, crackles, or rhonchi. No evident respiratory distress, on ventilator. Heart:   Regular rate and rhythm; no murmurs, clicks, rubs,  or gallops. Abdomen:  Soft, nontender, nontympanitic, and nondistended. No masses, hepatosplenomegaly or ventral hernias noted. Normal bowel sounds, without bruits, guarding, or rebound.   out gross deformities. Neurologic:  Alert and seemingly somewhat coherent;   Intake/Output from previous day: 12/22 0701 - 12/23 0700 In: 2490.5 [I.V.:665.5; NG/GT:1825] Out: 1600 [Urine:1600] Intake/Output this shift: Total I/O In: 255.8 [I.V.:195.8; NG/GT:60] Out: 300 [Urine:300]  Lab Results:  [facility] 04/12/12 0400 04/11/12 0344 04/10/12 0345  WBC 16.2* 16.6* 16.9*  HGB 7.9* 7.7* 8.4*  HCT 23.9* 23.8* 24.8*  PLT 395 346 313   BMET  Basename 04/12/12 0400 04/11/12 0344 04/10/12 0345  NA 152* 152* 148*  K 3.3* 3.6 3.4*  CL 114* 113* 108  CO2 30  29 29   GLUCOSE 159* 205* 180*  BUN 54* 57* 67*  CREATININE 1.21 1.25 1.43*  CALCIUM 8.8 8.7 9.1   LFT No results found for this basename: PROT,ALBUMIN,AST,ALT,ALKPHOS,BILITOT,BILIDIR,IBILI in the last 72 hours PT/INR No results found for this basename: LABPROT:2,INR:2 in the last 72 hours    Studies/Results: Dg Chest Port 1 View  04/12/2012  *RADIOLOGY REPORT*  Clinical Data: Tracheostomy tube.  PORTABLE CHEST - 1 VIEW  Comparison: 04/10/2012  Findings: Tracheostomy tube is present.  Central line tip in the SVC region.  There is a nasogastric tube in the upper stomach. Again noted are hazy densities at the right lung base which could represent atelectasis or mild airspace disease.  No evidence for pulmonary edema.  Heart size is stable.  IMPRESSION: Persistent hazy densities at the right lung base.  Support apparatuses as described.   Original Report Authenticated By: Richarda Overlie, M.D.     Impression:   Patient who has survived cardiac arrest was somewhat altered mental status. It is anticipated he will need enteral nutritional support for some time, if not permanently. Apparently, with his history of alcohol abuse and some prior strokes, he was having marginal swallowing function even prior to his recent cardiac arrest, per discussion with his attending physician.    Plan:  I agree that PEG placement is both medically appropriate and technically feasible. I have spoken with the patient's daughter, Johnny [name] [phone]), concerning the nature, purpose, and risks of PEG placement as well as alternatives such as interventional radiology placement. I feel the bedside PEG placement would be best in this patient who is on a ventilator. The daughter is agreeable to proceed and we will plan to do the procedure tomorrow.    LOS: 11 days   Yvett Rossel V  04/12/2012, 3:32 PM

## 2012-04-12 NOTE — Progress Notes (Signed)
Nursing 1100 Dr. Molli Knock made aware that patient is not experiencing hypoglycemia with tube feeds on hold.  Will not start D10 IVF at this time.  MD does not want any IVF at this time.  Will continue CBGs q4h.  No new orders at this time.

## 2012-04-12 NOTE — Progress Notes (Signed)
Patient continues to periods of apnea, placed back to full support.

## 2012-04-12 NOTE — Progress Notes (Signed)
Back to full support due to low VE.

## 2012-04-12 NOTE — Progress Notes (Signed)
PULMONARY  / CRITICAL CARE MEDICINE  Name: Johnny [name] MRN: [id number] DOB: 11-Apr-1935    LOS: 11  REFERRING MD :  TRH  CHIEF COMPLAINT:  Alcohol withdrawal/HCAP >> Cardiac arrest  BRIEF PATIENT DESCRIPTION:  77 yo male former smoker admitted 04/01/2012 with shakes and incontinent of urine/feces and found to have pneumonia in setting of ETOH abuse/withdrawal.  Developed septic shock, and PEA on 12/13 with VDRF and 20 min before ROSC.  PCCM assumed care from 12/13.   LINES / TUBES: 12/12 ETT >>12/20 12/20 Tstomy 8 >> 12/12 L IJ CVL >> 12/13 Lt radial Aline>>12/14  CULTURES: 12/12 resp >>GPC in pairs and candida albicans. 12/12 blood >>NTD 12/15 >> coag neg staph  12/12 urine >>negative 12/12 influenza >negative 12/12 urine strep >> negative 12/12 urine leg >>negative  ANTIBIOTICS: 12/12 Vanc (HCAP) >>12/15 12/12 Cefepime (HCAP) >>12/15 12/12 Levaqin (HCAP) >>  SIGNIFICANT EVENTS:  12/12 admission and PEA arrest 12/14 Off pressors 12/20 Trach  TESTS: 12/12 CT head >> chronic microvascular ischemic changes 12/13 EEG >> moderate-to-severe generalized continuous slowing of cerebral activity 12/13 Echo >> EF 50 to 55% (tested while on dobutamine) 12/13 Abd u/s >> hepatic steatosis, medical renal disease, gallbladder sludge with small stones  LEVEL OF CARE:  ICU PRIMARY SERVICE:  PCCM CONSULTANTS:  None CODE STATUS: full  DIET:  Tube feeds DVT Px:  Lovenox GI Px:  Pantoprazole   INTERVAL HISTORY:  Temp curve better  Weaning on ps 10/5 VITAL SIGNS: Temp:  [99.7 F (37.6 C)-100.2 F (37.9 C)] 100.2 F (37.9 C) (12/23 1137) Pulse Rate:  [79-122] 103  (12/23 1100) Resp:  [15-33] 16  (12/23 1100) BP: (112-147)/(56-78) 128/69 mmHg (12/23 1100) SpO2:  [100 %] 100 % (12/23 1144) FiO2 (%):  [29.6 %-30.5 %] 30 % (12/23 1144) Weight:  [75.2 kg (165 lb 12.6 oz)] 75.2 kg (165 lb 12.6 oz) (12/23 0400) HEMODYNAMICS: CVP:  [1 mmHg-8 mmHg] 1 mmHg VENTILATOR SETTINGS: Vent  Mode:  [-] CPAP FiO2 (%):  [29.6 %-30.5 %] 30 % Set Rate:  [10 bmp-16 bmp] 16 bmp Vt Set:  [400 mL-500 mL] 500 mL PEEP:  [4.6 cmH20-5 cmH20] 4.6 cmH20 Pressure Support:  [10 cmH20-14 cmH20] 10 cmH20 Plateau Pressure:  [14 cmH20-17 cmH20] 17 cmH20 INTAKE / OUTPUT: Intake/Output      12/22 0701 - 12/23 0700 12/23 0701 - 12/24 0700   I.V. (mL/kg) 665.5 (8.8) 110.3 (1.5)   NG/GT 1825 60   Total Intake(mL/kg) 2490.5 (33.1) 170.3 (2.3)   Urine (mL/kg/hr) 1600 (0.9) 150 (0.4)   Total Output 1600 150   Net +890.5 +20.3        Urine Occurrence 3 x 1 x    PHYSICAL EXAMINATION: Gen: No distress, sedated this am. HEENT: 8 tstomy, very poor dentition. PULM: Decreased breath sounds, no wheeze. CV: s1s2 regular, no murmur. AB: decreased bowel sounds, soft, non tender. Ext: no edema. Derm: excoriations around sacral region. Neuro:  following commands.  Non focal  LABS: Cbc  Lab 04/12/12 0400 04/11/12 0344 04/10/12 0345  WBC 16.2* -- --  HGB 7.9* 7.7* 8.4*  HCT 23.9* 23.8* 24.8*  PLT 395 346 313   Chemistry  Lab 04/12/12 0400 04/11/12 0344 04/10/12 0345 04/09/12 0415 04/08/12 0350  NA 152* 152* 148* -- --  K 3.3* 3.6 3.4* -- --  CL 114* 113* 108 -- --  CO2 30 29 29  -- --  BUN 54* 57* 67* -- --  CREATININE 1.21 1.25 1.43* -- --  CALCIUM 8.8 8.7 9.1 -- --  MG -- -- 2.0 2.1 2.3  PHOS -- -- 2.9 3.8 3.9  GLUCOSE 159* 205* 180* -- --   Liver fxn No results found for this basename: AST:3,ALT:3,ALKPHOS:3,BILITOT:3,PROT:3,ALBUMIN:3 in the last 168 hours coags  Lab 04/09/12 0415  APTT 38*  INR 1.34   Sepsis markers No results found for this basename: LATICACIDVEN:3,PROCALCITON:3 in the last 168 hours Cardiac markers No results found for this basename: CKTOTAL:3,CKMB:3,TROPONINI:3 in the last 168 hours BNP No results found for this basename: PROBNP:3 in the last 168 hours ABG  Lab 04/10/12 0416 04/09/12 0443 04/08/12 0346  PHART 7.656* 7.624* 7.527*  PCO2ART 27.2* 32.9* 40.3   PO2ART 94.0 102.0* 72.4*  HCO3 30.3* 34.1* 33.3*  TCO2 31 35 34.5   CBG trend  Lab 04/12/12 1052 04/12/12 0728 04/12/12 0404 04/11/12 2341 04/11/12 1958  GLUCAP 187* 171* 139* 202* 259*    Intake/Output Summary (Last 24 hours) at 04/12/12 1150 Last data filed at 04/12/12 1100  Gross per 24 hour  Intake 2161.8 ml  Output   1750 ml  Net  411.8 ml   IMAGING:  Dg Chest Port 1 View  04/12/2012  *RADIOLOGY REPORT*  Clinical Data: Tracheostomy tube.  PORTABLE CHEST - 1 VIEW  Comparison: 04/10/2012  Findings: Tracheostomy tube is present.  Central line tip in the SVC region.  There is a nasogastric tube in the upper stomach. Again noted are hazy densities at the right lung base which could represent atelectasis or mild airspace disease.  No evidence for pulmonary edema.  Heart size is stable.  IMPRESSION: Persistent hazy densities at the right lung base.  Support apparatuses as described.   Original Report Authenticated By: Richarda Overlie, M.D.    DIAGNOSES: Principal Problem:  *HCAP (healthcare-associated pneumonia) Active Problems:  DIABETES MELLITUS, TYPE II  HYPERTENSION  Alcohol dependence  ARF (acute renal failure)  Volume depletion  Hyponatremia  Acute respiratory failure with hypoxia  Cardiac arrest  Septic shock(785.52)  Encephalopathy  Tracheostomy status  Protein-calorie malnutrition, severe  ASSESSMENT / PLAN:  PULMONARY  ASSESSMENT: Acute respiratory failure in setting of PNA and cardiac arrest with hx of smoking. Tstomy  PLAN:   - Failed TC trials today, will place on PS and attempt to keep dry for weaning. - Refer to LTAC.  CARDIOVASCULAR  ASSESSMENT:  PEA. Likely related to pneumonia, acidosis, hypoxic respiratory failure in setting of ETOH withdrawal. Septic shock 2nd to PNA. Resolved 12/14. Hypervolemia. PLAN:  - Per cards. - Would maintain as dry as tolerated.  RENAL  ASSESSMENT:   Acute renal failure 2nd to shock. Baseline creatinine 0.95  from 01/05/12.  Lactic acidosis related to shock and liver dysfx. Resolved. Hypokalemia, hypomagnesemia, hypophosphatemia.  Intake/Output Summary (Last 24 hours) at 04/12/12 1150 Last data filed at 04/12/12 1100  Gross per 24 hour  Intake 2161.8 ml  Output   1750 ml  Net  411.8 ml   PLAN:   - Hold lasix until able to access GI trac then will restart free water flushes and lasix.  GASTROINTESTINAL  ASSESSMENT:   Protein calorie malnutrition with hx of ETOH. Fatty liver 2nd to ETOH. PLAN:   - Hold TF since unable to access GI tract for now. [facility] called for PEG placement, hopefully tomorrow.  - Thiamine, folic acid, MVI.  HEMATOLOGIC  ASSESSMENT:   Anemia, thrombocytopenia likely related to ETOH and chronic disease with critical illness. PLAN:  - F/u CBC. - Transfuse for Hb < 7.  INFECTIOUS Blood 12/15>>>NTD Sputum 12/14>>>Candida Urine 12/15>>>NTD ASSESSMENT:   Pneumonia New fever 12/21 >>observe PLAN:   D10/ 10 x levaquin, with the poor dentition, will need tooth extraction once more stable (likely in an LTAC rather than in the acute care hospital). D/c'd vancomycin/cefepime 12/15   ENDOCRINE  ASSESSMENT:   DM type II. PLAN:   SSI  NEUROLOGIC  ASSESSMENT:   EtOH withdrawal. Acute encephalopathy 2nd to sepsis, hypoxia, and cardiac arrest with ?anoxic injury.  PLAN:   Changed to fentanyl and precedex with good response, taper precedex to off as tolerated.  GLOBAL - plan for LTAC ?>>[facility]  Critical care time 35 minutes.  Alyson Reedy, M.D. [facility] Pulmonary/Critical Care Medicine. Pager: [phone]. After hours pager: [phone].

## 2012-04-12 NOTE — Progress Notes (Signed)
Nursing  [phone] RN suctioning patient's mouth and assessed NG tube coiled in mouth.  RN stopped tube feeds.  RN pulled NG back and replaced, 2 RNs verified auscultation of air.  RN again assessed mouth and found NG tube coiled in mouth. RN removed NG tube and attempted to replace NG, NG continuously becoming coiled in patient's mouth.  Multiple RNs attempted placing 55F and 44F NG tubes, all of which coiled in patient's mouth.  MD made aware that RN was unable to place NG, ok to leave out at this time.  MD to consult for PEG placement.

## 2012-04-13 ENCOUNTER — Encounter (HOSPITAL_COMMUNITY)
Admission: EM | Disposition: A | Discharge status: Nursing Home (Includes ICF) | Payer: Self-pay | Source: Home / Self Care | Attending: Pulmonary Disease

## 2012-04-13 ENCOUNTER — Encounter (HOSPITAL_COMMUNITY): Payer: Self-pay | Admitting: Gastroenterology

## 2012-04-13 DIAGNOSIS — E860 Dehydration: Secondary | ICD-10-CM

## 2012-04-13 DIAGNOSIS — I4892 Unspecified atrial flutter: Secondary | ICD-10-CM | POA: Diagnosis not present

## 2012-04-13 HISTORY — PX: ESOPHAGOGASTRODUODENOSCOPY: SHX5428

## 2012-04-13 HISTORY — PX: PEG PLACEMENT: SHX5437

## 2012-04-13 LAB — GLUCOSE, CAPILLARY
Glucose-Capillary: 155 mg/dL — ABNORMAL HIGH (ref 70–99)
Glucose-Capillary: 163 mg/dL — ABNORMAL HIGH (ref 70–99)
Glucose-Capillary: 177 mg/dL — ABNORMAL HIGH (ref 70–99)
Glucose-Capillary: 181 mg/dL — ABNORMAL HIGH (ref 70–99)
Glucose-Capillary: 201 mg/dL — ABNORMAL HIGH (ref 70–99)

## 2012-04-13 LAB — BASIC METABOLIC PANEL
BUN: 50 mg/dL — ABNORMAL HIGH (ref 6–23)
Chloride: 118 mEq/L — ABNORMAL HIGH (ref 96–112)
Glucose, Bld: 114 mg/dL — ABNORMAL HIGH (ref 70–99)
Potassium: 3.4 mEq/L — ABNORMAL LOW (ref 3.5–5.1)

## 2012-04-13 LAB — CBC
HCT: 22.1 % — ABNORMAL LOW (ref 39.0–52.0)
Hemoglobin: 7.4 g/dL — ABNORMAL LOW (ref 13.0–17.0)
WBC: 14.3 10*3/uL — ABNORMAL HIGH (ref 4.0–10.5)

## 2012-04-13 SURGERY — INSERTION, PEG TUBE
Anesthesia: Moderate Sedation

## 2012-04-13 SURGERY — EGD (ESOPHAGOGASTRODUODENOSCOPY)
Anesthesia: Moderate Sedation

## 2012-04-13 MED ORDER — METOPROLOL TARTRATE 1 MG/ML IV SOLN
5.0000 mg | Freq: Four times a day (QID) | INTRAVENOUS | Status: DC
  Administered 2012-04-13 – 2012-04-14 (×3): 5 mg via INTRAVENOUS
  Filled 2012-04-13 (×7): qty 5

## 2012-04-13 MED ORDER — JEVITY 1.2 CAL PO LIQD
1000.0000 mL | ORAL | Status: DC
  Administered 2012-04-13: 45 mL/h
  Administered 2012-04-15: 10:00:00
  Administered 2012-04-16: 1000 mL
  Administered 2012-04-17: 02:00:00
  Administered 2012-04-17: 1000 mL
  Administered 2012-04-19
  Administered 2012-04-19: 1000 mL
  Filled 2012-04-13 (×12): qty 1000

## 2012-04-13 MED ORDER — ALPRAZOLAM 0.25 MG PO TABS
0.2500 mg | ORAL_TABLET | Freq: Three times a day (TID) | ORAL | Status: DC | PRN
  Administered 2012-04-17 – 2012-04-27 (×7): 0.25 mg via ORAL
  Filled 2012-04-13 (×7): qty 1

## 2012-04-13 MED ORDER — DILTIAZEM LOAD VIA INFUSION
15.0000 mg | Freq: Once | INTRAVENOUS | Status: AC
  Administered 2012-04-13: 15 mg via INTRAVENOUS
  Filled 2012-04-13: qty 15

## 2012-04-13 MED ORDER — ALPRAZOLAM 0.25 MG PO TABS
0.2500 mg | ORAL_TABLET | Freq: Once | ORAL | Status: AC
  Administered 2012-04-13: 0.25 mg via ORAL
  Filled 2012-04-13: qty 1

## 2012-04-13 MED ORDER — FENTANYL CITRATE 0.05 MG/ML IJ SOLN
INTRAMUSCULAR | Status: DC | PRN
  Administered 2012-04-13 (×2): 25 ug via INTRAVENOUS

## 2012-04-13 MED ORDER — POTASSIUM CHLORIDE 2 MEQ/ML IV SOLN
INTRAVENOUS | Status: DC
  Administered 2012-04-13: 06:00:00 via INTRAVENOUS
  Filled 2012-04-13: qty 1000

## 2012-04-13 MED ORDER — FENTANYL CITRATE 0.05 MG/ML IJ SOLN
INTRAMUSCULAR | Status: AC
  Filled 2012-04-13: qty 2

## 2012-04-13 MED ORDER — DILTIAZEM HCL 100 MG IV SOLR
5.0000 mg/h | INTRAVENOUS | Status: DC
  Administered 2012-04-13: 5 mg/h via INTRAVENOUS
  Administered 2012-04-14: 15 mg/h via INTRAVENOUS
  Administered 2012-04-14: 5 mg/h via INTRAVENOUS
  Administered 2012-04-15: 15 mg/h via INTRAVENOUS
  Filled 2012-04-13: qty 100

## 2012-04-13 MED ORDER — POTASSIUM CHLORIDE 20 MEQ/15ML (10%) PO LIQD
40.0000 meq | Freq: Three times a day (TID) | ORAL | Status: AC
  Administered 2012-04-13 (×2): 40 meq
  Filled 2012-04-13 (×2): qty 30

## 2012-04-13 MED ORDER — MIDAZOLAM HCL 5 MG/ML IJ SOLN
INTRAMUSCULAR | Status: AC
  Filled 2012-04-13: qty 2

## 2012-04-13 MED ORDER — FREE WATER
250.0000 mL | Freq: Four times a day (QID) | Status: DC
  Administered 2012-04-13 – 2012-04-14 (×3): 250 mL

## 2012-04-13 MED ORDER — MIDAZOLAM HCL 10 MG/2ML IJ SOLN
INTRAMUSCULAR | Status: DC | PRN
  Administered 2012-04-13: 1 mg via INTRAVENOUS
  Administered 2012-04-13: 2 mg via INTRAVENOUS

## 2012-04-13 MED ORDER — CLONAZEPAM 0.5 MG PO TABS
0.5000 mg | ORAL_TABLET | Freq: Two times a day (BID) | ORAL | Status: DC
  Administered 2012-04-13 – 2012-04-14 (×3): 0.5 mg via ORAL
  Filled 2012-04-13 (×3): qty 1

## 2012-04-13 NOTE — Progress Notes (Signed)
eLink Physician-Brief Progress Note Patient Name: MARKELL SCIASCIA DOB: 1934/10/10 MRN: [id number]  Date of Service  04/13/2012   HPI/Events of Note   Pt too unstable to tfr   eICU Interventions  Keep in ICU d/t tachycardia,.  He cannot be cared for in 2600 setting   Intervention Category Major Interventions: Arrhythmia - evaluation and management  Shan Levans 04/13/2012, 7:08 PM

## 2012-04-13 NOTE — Progress Notes (Signed)
MD informed  Via elink of pt's high heart rate. He ordered to not transfer pt.Charge nurse informed.

## 2012-04-13 NOTE — Progress Notes (Signed)
eLink Physician-Brief Progress Note Patient Name: Johnny [name] DOB: 08-07-1934 MRN: [id number]  Date of Service  04/13/2012   HPI/Events of Note   Aflutter 2:1 block  eICU Interventions  cardiazem ordered   Intervention Category Major Interventions: Arrhythmia - evaluation and management  Shan Levans 04/13/2012, 8:39 PM

## 2012-04-13 NOTE — Op Note (Addendum)
[facility] [address] [place], [address]   OPERATIVE PROCEDURE REPORT  PATIENT: Johnny [name], Johnny [name]  MR#: [id number] BIRTHDATE :Nov 10, 1934 , 77  yrs. old GENDER: Male ENDOSCOPIST: Bernette Redbird, MD PROCEDURE DATE:  04/13/2012 PROCEDURE:  PERCUTANEOUS ENDOSCOPIC GASTROSTOMY PLACEMENT  (initial placement) ASA CLASS: INDICATIONS: unfortunate 77 year old male with history of alcohol abuse, status post previous CVAs and baseline dysphagia, now with altered mental status, status post cardiac arrest MEDICATIONS: fentanyl 50 mcg, Versed 3 mg IV TOPICAL ANESTHETIC: none  DESCRIPTION OF PROCEDURE:  After the risks benefits and alternatives of the procedure were thoroughly explained, informed consent was obtained from the patient's daughter, Arby Barrette [phone]). the procedure was done at the bedside in the intensive care unit, with the patient on the ventilator. He was given the above sedation and remained stable throughout the procedure. The pentax egd [id number] endoscope was introduced through the mouth  and advanced to the second portion of the duodenum .        The instrument was slowly withdrawn as the mucosa was fully examined.     The stomach was then inflated with air, and by a combination of transillumination and manual palpation, the site for the gastrostomy tube placement was selected and marked on the anterior abdominal wall.  The skin of the anterior abdomen was surgically prepped and draped with a sterile towel.  Utilizing strict sterile technique, the selected site was then anesthetized with 1% xylocaine by injection into the skin and subcutaneous tissue by the endoscopy nurse.  A 1.5 cm incision was made through the skin, and the needle/cannula assembly was then passed through the abdominal wall and through the anterior wall of the stomach on the first pass, maintaining visualization with the endoscope.  A snare device previously placed  through the instrument channel was then opened, the needle was removed, and the insertion wire was passed through the cannula and into the stomach lumen. The snare was then used to snare the insertion wire.  The snare was then pulled up to the endoscope distal tip, and the scope was then withdrawn bringing with it the snare and insertion wire.  The insertion wire was then released from the snare, and the "push" -type PEG tube was slid over the guidewire and pushed until its leading edge appears the anterior abdominal wall, whereupon it was pulled the remainder of the distance.  d]   Yhe patient was then rere endoscoped under direct vision to confirm appropriate positioning and tension of the internal bolster.  The external bolster was placed over the tube to secure it to the abdominal wall, after a sterile dressing was applied after application of triple antibiotic ointment and the procedure terminated.  The patient tolerated it well.  COMPLICATIONS: There were no complications.   ENDOSCOPIC IMPRESSION:  1. Normal endoscopic exam. Specifically, no varices or evidence of portal hypertension, no esophageal stricture, no masses or ulcers. 2.successful placement of a 24 French peg tube   RECOMMENDATIONS: the patient will be observed overnight, with the intent of starting tube feedings in the morning if the patient remains clinically stable.  REPEAT EXAM: not necessary   _______________________________ eSignedBernette Redbird, MD 04/13/2012 10:34 AM    CC:  PATIENT NAME:  Johnny [name], Johnny [name] MR#: [id number]

## 2012-04-13 NOTE — Progress Notes (Signed)
NUTRITION FOLLOW UP  Intervention:   1. Once PEG is placed and ready to use, recommend initiation of previous formula. Initiate Jevity 1.2 at 25 ml/hr advance by 10 ml/hr every 4 hours, or as tolerated, to goal of 55 ml/hr. 30 ml Prostat via tube TID. This regimen will provide: 1884 kcal, 118 grams protein, and 1065 ml H2O.  3. RD will continue to follow   Nutrition Dx:   Inadequate oral intake related to inability to eat as evidenced by NPO. Ongoing.  Goal:   EN to meet >/=90% estimated nutrition needs. Not met at this time 2/2 EN regimen on hold for PEG placement this morning.  Monitor:   Vent status, Trach/PEG, weight trends, I/O's  Assessment:   Trach placed 12/20. Noted that NGT was found coiled in patient's mouth yesterday. RN stopped the tube feedings and replaced the NGT. The RN removed the NG tube again and replaced again. This happened a third time, and MD told RN to leave tube out at this time, as pt to have PEG today. PEG placed this morning via endoscopy. TF has yet to be re-started at this time.  Previous TF orders: Jevity 1.2 at 60 ml/hr. 30 ml Prostat via tube TID. Tube feeding regimen provides 2028 kcal, 125 grams protein, and 1162 ml H2O.   Residuals: n/a   Last bm: 12/22 (diarrhea) Pt continues with rectal tuba for diarrhea.   Patient is currently intubated on ventilator support.  MV: 9 Temp:Temp (24hrs), Avg:99.6 F (37.6 C), Min:99.1 F (37.3 C), Max:100.2 F (37.9 C)   Height: Ht Readings from Last 1 Encounters:  04/01/12 5' 10.5" (1.791 m)    Weight Status:   Wt Readings from Last 1 Encounters:  04/13/12 166 lb 0.1 oz (75.3 kg)  Stabilizing  Re-estimated needs:  Kcal: 1845 Protein: 115-130 gm  Fluid: per team  Skin: skin tear to sacrum  Diet Order: NPO   Intake/Output Summary (Last 24 hours) at 04/13/12 1135 Last data filed at 04/13/12 1000  Gross per 24 hour  Intake  822.3 ml  Output   1185 ml  Net -362.7 ml   Labs:   Lab 04/13/12  0400 04/12/12 0400 04/11/12 0344 04/10/12 0345 04/09/12 0415  NA 155* 152* 152* -- --  K 3.4* 3.3* 3.6 -- --  CL 118* 114* 113* -- --  CO2 27 30 29  -- --  BUN 50* 54* 57* -- --  CREATININE 1.32 1.21 1.25 -- --  CALCIUM 8.9 8.8 8.7 -- --  MG 2.0 -- -- 2.0 2.1  PHOS 3.1 -- -- 2.9 3.8  GLUCOSE 114* 159* 205* -- --   Sodium  Date/Time Value Range Status  04/13/2012  4:00 AM 155* 135 - 145 mEq/L Final  04/12/2012  4:00 AM 152* 135 - 145 mEq/L Final  04/11/2012  3:44 AM 152* 135 - 145 mEq/L Final    Potassium  Date/Time Value Range Status  04/13/2012  4:00 AM 3.4* 3.5 - 5.1 mEq/L Final  04/12/2012  4:00 AM 3.3* 3.5 - 5.1 mEq/L Final  04/11/2012  3:44 AM 3.6  3.5 - 5.1 mEq/L Final    Phosphorus  Date/Time Value Range Status  04/13/2012  4:00 AM 3.1  2.3 - 4.6 mg/dL Final  57/84/6962  9:52 AM 2.9  2.3 - 4.6 mg/dL Final  84/13/2440  1:02 AM 3.8  2.3 - 4.6 mg/dL Final    Magnesium  Date/Time Value Range Status  04/13/2012  4:00 AM 2.0  1.5 - 2.5 mg/dL  Final  04/10/2012  3:45 AM 2.0  1.5 - 2.5 mg/dL Final  16/01/9603  5:40 AM 2.1  1.5 - 2.5 mg/dL Final     CBG (last 3)   Basename 04/13/12 0417 04/13/12 0042 04/12/12 1942  GLUCAP 102* 177* 180*    Scheduled Meds:    . antiseptic oral rinse  15 mL Mouth Rinse QID  . chlorhexidine  15 mL Mouth Rinse BID  . folic acid  1 mg Oral Daily  . heparin subcutaneous  5,000 Units Subcutaneous Q8H  . insulin aspart  0-15 Units Subcutaneous Q4H  . insulin aspart  4 Units Subcutaneous Q4H  . multivitamin  5 mL Oral Daily  . pantoprazole sodium  40 mg Per Tube Q24H  . thiamine  100 mg Oral Daily    Continuous Infusions:    . sodium chloride 10 mL/hr at 04/12/12 1900  . dexmedetomidine 0.6 mcg/kg/hr (04/13/12 0540)  . dextrose 40 mL/hr at 04/08/12 2333  . dextrose 5 % with kcl 50 mL/hr at 04/13/12 0538   Jarold Motto MS, RD, LDN Pager: [phone] After-hours pager: [phone]

## 2012-04-13 NOTE — Progress Notes (Signed)
PULMONARY  / CRITICAL CARE MEDICINE  Name: Johnny [name] MRN: [id number] DOB: [birth year]    LOS: 12  REFERRING MD :  TRH  CHIEF COMPLAINT:  Alcohol withdrawal/HCAP >> Cardiac arrest  BRIEF PATIENT DESCRIPTION:  77 yo male former smoker admitted 04/01/2012 with shakes and incontinent of urine/feces and found to have pneumonia in setting of ETOH abuse/withdrawal.  Developed septic shock, and PEA on 12/13 with VDRF and 20 min before ROSC.  PCCM assumed care from 12/13.   LINES / TUBES: 12/12 ETT >>12/20 12/20 Tstomy (JY) >> 12/12 L IJ CVL >> 12/13 Lt radial Aline>>12/14  CULTURES: 12/12 resp >>GPC in pairs and candida albicans. 12/12 blood >>NTD 12/15 >> coag neg staph  12/12 urine >>negative 12/12 influenza >negative 12/12 urine strep >> negative 12/12 urine leg >>negative  ANTIBIOTICS: 12/12 Vanc (HCAP) >>12/15 12/12 Cefepime (HCAP) >>12/15 12/12 Levaqin (HCAP) >>12/20  SIGNIFICANT EVENTS:  12/12 admission and PEA arrest 12/14 Off pressors 12/20 Trach  TESTS: 12/12 CT head >> chronic microvascular ischemic changes 12/13 EEG >> moderate-to-severe generalized continuous slowing of cerebral activity 12/13 Echo >> EF 50 to 55% (tested while on dobutamine) 12/13 Abd u/s >> hepatic steatosis, medical renal disease, gallbladder sludge with small stones  LEVEL OF CARE:  ICU PRIMARY SERVICE:  PCCM CONSULTANTS:  None CODE STATUS: full  DIET:  Tube feeds DVT Px:  Lovenox GI Px:  Pantoprazole   INTERVAL HISTORY:  Temp curve better  Weaning on ps 10/5 VITAL SIGNS: Temp:  [98.5 F (36.9 C)-99.8 F (37.7 C)] 98.5 F (36.9 C) (12/24 1145) Pulse Rate:  [77-98] 81  (12/24 1215) Resp:  [11-29] 20  (12/24 1215) BP: (92-156)/(48-81) 110/52 mmHg (12/24 1215) SpO2:  [100 %] 100 % (12/24 1215) FiO2 (%):  [29.5 %-30.5 %] 30.2 % (12/24 1215) Weight:  [75.3 kg (166 lb 0.1 oz)] 75.3 kg (166 lb 0.1 oz) (12/24 0447) HEMODYNAMICS: CVP:  [0 mmHg-4 mmHg] 3 mmHg VENTILATOR  SETTINGS: Vent Mode:  [-] CPAP FiO2 (%):  [29.5 %-30.5 %] 30.2 % Set Rate:  [16 bmp] 16 bmp Vt Set:  [500 mL] 500 mL PEEP:  [4.9 cmH20-5 cmH20] 5 cmH20 Pressure Support:  [12 cmH20] 12 cmH20 Plateau Pressure:  [17 cmH20] 17 cmH20 INTAKE / OUTPUT: Intake/Output      12/23 0701 - 12/24 0700 12/24 0701 - 12/25 0700   I.V. (mL/kg) 719.3 (9.6) 355.5 (4.7)   NG/GT 60    IV Piggyback  50   Total Intake(mL/kg) 779.3 (10.3) 405.5 (5.4)   Urine (mL/kg/hr) 885 (0.5) 150 (0.4)   Stool 300    Total Output 1185 150   Net -405.7 +255.5        Urine Occurrence 1 x     PHYSICAL EXAMINATION: Gen: No distress, sedated this am. HEENT: 8 tstomy, very poor dentition. PULM: Decreased breath sounds, no wheeze. CV: s1s2 regular, no murmur. AB: decreased bowel sounds, soft, non tender. Ext: no edema. Derm: excoriations around sacral region. Neuro:  following commands.  Non focal  LABS: Cbc  Lab 04/13/12 0400 04/12/12 0400 04/11/12 0344  WBC 14.3* -- --  HGB 7.4* 7.9* 7.7*  HCT 22.1* 23.9* 23.8*  PLT 392 395 346   Chemistry  Lab 04/13/12 0400 04/12/12 0400 04/11/12 0344 04/10/12 0345 04/09/12 0415  NA 155* 152* 152* -- --  K 3.4* 3.3* 3.6 -- --  CL 118* 114* 113* -- --  CO2 27 30 29  -- --  BUN 50* 54* 57* -- --  CREATININE  1.32 1.21 1.25 -- --  CALCIUM 8.9 8.8 8.7 -- --  MG 2.0 -- -- 2.0 2.1  PHOS 3.1 -- -- 2.9 3.8  GLUCOSE 114* 159* 205* -- --   Liver fxn No results found for this basename: AST:3,ALT:3,ALKPHOS:3,BILITOT:3,PROT:3,ALBUMIN:3 in the last 168 hours coags  Lab 04/09/12 0415  APTT 38*  INR 1.34   Sepsis markers No results found for this basename: LATICACIDVEN:3,PROCALCITON:3 in the last 168 hours Cardiac markers No results found for this basename: CKTOTAL:3,CKMB:3,TROPONINI:3 in the last 168 hours BNP No results found for this basename: PROBNP:3 in the last 168 hours ABG  Lab 04/10/12 0416 04/09/12 0443 04/08/12 0346  PHART 7.656* 7.624* 7.527*  PCO2ART 27.2*  32.9* 40.3  PO2ART 94.0 102.0* 72.4*  HCO3 30.3* 34.1* 33.3*  TCO2 31 35 34.5   CBG trend  Lab 04/13/12 1143 04/13/12 0417 04/13/12 0042 04/12/12 1942 04/12/12 1537  GLUCAP 163* 102* 177* 180* 147*    Intake/Output Summary (Last 24 hours) at 04/13/12 1230 Last data filed at 04/13/12 1228  Gross per 24 hour  Intake    986 ml  Output   1035 ml  Net    -49 ml   IMAGING:  Dg Chest Port 1 View  04/12/2012  *RADIOLOGY REPORT*  Clinical Data: Tracheostomy tube.  PORTABLE CHEST - 1 VIEW  Comparison: 04/10/2012  Findings: Tracheostomy tube is present.  Central line tip in the SVC region.  There is a nasogastric tube in the upper stomach. Again noted are hazy densities at the right lung base which could represent atelectasis or mild airspace disease.  No evidence for pulmonary edema.  Heart size is stable.  IMPRESSION: Persistent hazy densities at the right lung base.  Support apparatuses as described.   Original Report Authenticated By: Richarda Overlie, M.D.    DIAGNOSES: Principal Problem:  *HCAP (healthcare-associated pneumonia) Active Problems:  DIABETES MELLITUS, TYPE II  HYPERTENSION  Alcohol dependence  ARF (acute renal failure)  Volume depletion  Hyponatremia  Acute respiratory failure with hypoxia  Cardiac arrest  Septic shock(785.52)  Encephalopathy  Tracheostomy status  Protein-calorie malnutrition, severe  ASSESSMENT / PLAN:  PULMONARY  ASSESSMENT: Acute respiratory failure in setting of PNA and cardiac arrest with hx of smoking.  Suspect anxiety is the major component for respiratory failure at this time. Tstomy  PLAN:   - D/C precedex. - Start klonipin. - Refer to LTAC. - Advance to TC after patient is given benzos.  CARDIOVASCULAR  ASSESSMENT:  PEA. Likely related to pneumonia, acidosis, hypoxic respiratory failure in setting of ETOH withdrawal. Septic shock 2nd to PNA. Resolved 12/14. Hypervolemia. PLAN:  - Per cards. - Would maintain as dry as  tolerated.  RENAL  ASSESSMENT:   Acute renal failure 2nd to shock. Baseline creatinine 0.95 from 01/05/12.  Lactic acidosis related to shock and liver dysfx. Resolved. Hypokalemia, hypomagnesemia, hypophosphatemia.  Intake/Output Summary (Last 24 hours) at 04/13/12 1230 Last data filed at 04/13/12 1228  Gross per 24 hour  Intake    986 ml  Output   1035 ml  Net    -49 ml   PLAN:   - Start free water boluses. - Finish D5W ordered above. - Hold lasix for today. - K replacement.  GASTROINTESTINAL  ASSESSMENT:   Protein calorie malnutrition with hx of ETOH. Fatty liver 2nd to ETOH. PLAN:   - Restart TF since unable to access GI tract for now. - PEG in place, appreciate GI's efforts.  - Thiamine, folic acid, MVI.  HEMATOLOGIC  ASSESSMENT:   Anemia, thrombocytopenia likely related to ETOH and chronic disease with critical illness. PLAN:  - F/u CBC. - Transfuse for Hb < 7.  INFECTIOUS Blood 12/15>>>NTD Sputum 12/14>>>Candida Urine 12/15>>>NTD ASSESSMENT:   Pneumonia New fever 12/21 >>observe PLAN:   D10/ 10 x levaquin, with the poor dentition, will need tooth extraction once more stable (likely in an LTAC rather than in the acute care hospital). D/c'd vancomycin/cefepime 12/15   ENDOCRINE  ASSESSMENT:   DM type II. PLAN:   SSI  NEUROLOGIC  ASSESSMENT:   EtOH withdrawal. Acute encephalopathy 2nd to sepsis, hypoxia, and cardiac arrest with ?anoxic injury.  PLAN:   D/C precedex. Fentanyl PRN. Clonazepam for anxiety and withdrawal. Xanax for break through.  [facility] - plan for LTAC ?>>[facility]  Critical care time 35 minutes.  Alyson Reedy, M.D. [facility] Pulmonary/Critical Care Medicine. Pager: [phone]. After hours pager: [phone].

## 2012-04-13 NOTE — Progress Notes (Signed)
eLink Physician-Brief Progress Note Patient Name: Johnny [name] DOB: 02-14-1935 MRN: [id number]  Date of Service  04/13/2012   HPI/Events of Note   Pt agitated and HR elevated.   eICU Interventions  Scheduled metoprolol and give Iv fentanyl   Intervention Category Major Interventions: Delirium, psychosis, severe agitation - evaluation and management;Arrhythmia - evaluation and management  Shan Levans 04/13/2012, 4:49 PM

## 2012-04-14 LAB — BASIC METABOLIC PANEL
CO2: 24 mEq/L (ref 19–32)
Chloride: 117 mEq/L — ABNORMAL HIGH (ref 96–112)
GFR calc non Af Amer: 48 mL/min — ABNORMAL LOW (ref >90)
Glucose, Bld: 324 mg/dL — ABNORMAL HIGH (ref 70–99)
Potassium: 4 mEq/L (ref 3.5–5.1)
Sodium: 152 mEq/L — ABNORMAL HIGH (ref 135–145)

## 2012-04-14 LAB — GLUCOSE, CAPILLARY
Glucose-Capillary: 176 mg/dL — ABNORMAL HIGH (ref 70–99)
Glucose-Capillary: 229 mg/dL — ABNORMAL HIGH (ref 70–99)
Glucose-Capillary: 230 mg/dL — ABNORMAL HIGH (ref 70–99)
Glucose-Capillary: 260 mg/dL — ABNORMAL HIGH (ref 70–99)

## 2012-04-14 LAB — CBC
HCT: 22.3 % — ABNORMAL LOW (ref 39.0–52.0)
Hemoglobin: 7.2 g/dL — ABNORMAL LOW (ref 13.0–17.0)
MCHC: 32.3 g/dL (ref 30.0–36.0)
RBC: 2.26 MIL/uL — ABNORMAL LOW (ref 4.22–5.81)
WBC: 14 10*3/uL — ABNORMAL HIGH (ref 4.0–10.5)

## 2012-04-14 LAB — IRON AND TIBC: UIBC: 106 ug/dL — ABNORMAL LOW (ref 125–400)

## 2012-04-14 LAB — PHOSPHORUS: Phosphorus: 3.2 mg/dL (ref 2.3–4.6)

## 2012-04-14 MED ORDER — CLONAZEPAM 0.5 MG PO TABS
1.0000 mg | ORAL_TABLET | Freq: Two times a day (BID) | ORAL | Status: DC
  Administered 2012-04-14 – 2012-04-18 (×9): 1 mg via ORAL
  Filled 2012-04-14 (×3): qty 1
  Filled 2012-04-14: qty 2
  Filled 2012-04-14 (×4): qty 1
  Filled 2012-04-14: qty 2
  Filled 2012-04-14: qty 1

## 2012-04-14 MED ORDER — IPRATROPIUM-ALBUTEROL 18-103 MCG/ACT IN AERO
4.0000 | INHALATION_SPRAY | RESPIRATORY_TRACT | Status: DC | PRN
  Filled 2012-04-14: qty 14.7

## 2012-04-14 MED ORDER — FREE WATER
250.0000 mL | Status: DC
  Administered 2012-04-14 – 2012-04-19 (×29): 250 mL

## 2012-04-14 MED ORDER — METOPROLOL TARTRATE 25 MG/10 ML ORAL SUSPENSION
50.0000 mg | Freq: Two times a day (BID) | ORAL | Status: DC
  Administered 2012-04-14 – 2012-05-06 (×46): 50 mg
  Filled 2012-04-14 (×49): qty 20

## 2012-04-14 NOTE — Progress Notes (Signed)
Patient is 24-hour status post PEG placement and seems clinically stable from the GI tract standpoint, and in fact, tube feedings have been restarted and so far are well tolerated, without significant residual, per RN.   Of some concern is the fact that the patient was febrile yesterday and again slightly this morning, although his white count has remained essentially stable from yesterday at 14,000, which is actually better than a couple of days earlier. The abdomen is quiet, but soft and nontender. The patient, when asked, indicates that he is not having abdominal pain.  Impression: Benign post PEG course.  Recommendation: I will sign off at this time. Please call if I can be of further assistance.  Florencia Reasons, M.D. [phone]

## 2012-04-14 NOTE — Progress Notes (Signed)
PULMONARY  / CRITICAL CARE MEDICINE  Name: TSUGIO ELISON MRN: [id number] DOB: [birth year]    LOS: 13  REFERRING MD :  TRH  CHIEF COMPLAINT:  Alcohol withdrawal/HCAP >> Cardiac arrest  BRIEF PATIENT DESCRIPTION:  77 yo male former smoker admitted 04/01/2012 with shakes and incontinent of urine/feces and found to have pneumonia in setting of ETOH abuse/withdrawal.  Developed septic shock, and PEA on 12/13 with VDRF and 20 min before ROSC.  PCCM assumed care from 12/13.   LINES / TUBES: 12/12 ETT >>12/20 12/20 Tstomy (JY) >> 12/12 L IJ CVL >> 12/13 Lt radial Aline>>12/14  CULTURES: 12/12 resp >>GPC in pairs and candida albicans. 12/12 blood >>NTD 12/15 >> coag neg staph  12/12 urine >>negative 12/12 influenza >negative 12/12 urine strep >> negative 12/12 urine leg >>negative  ANTIBIOTICS: 12/12 Vanc (HCAP) >>12/15 12/12 Cefepime (HCAP) >>12/15 12/12 Levaqin (HCAP) >>12/20  SIGNIFICANT EVENTS:  12/12 admission and PEA arrest 12/14 Off pressors 12/20 Trach 12/24 A flutter, cardizem gtt started  TESTS: 12/12 CT head >> chronic microvascular ischemic changes 12/13 EEG >> moderate-to-severe generalized continuous slowing of cerebral activity 12/13 Echo >> EF 50 to 55% (tested while on dobutamine) 12/13 Abd u/s >> hepatic steatosis, medical renal disease, gallbladder sludge with small stones  LEVEL OF CARE:  ICU PRIMARY SERVICE:  PCCM CONSULTANTS:  None CODE STATUS: full  DIET:  Tube feeds DVT Px:  Lovenox GI Px:  Pantoprazole   INTERVAL HISTORY:  Low grade temp.  Remains on cardizem gtt.  VITAL SIGNS: Temp:  [98.5 F (36.9 C)-102.5 F (39.2 C)] 100.8 F (38.2 C) (12/25 0900) Pulse Rate:  [62-165] 114  (12/25 1000) Resp:  [14-29] 18  (12/25 1000) BP: (84-135)/(44-73) 108/51 mmHg (12/25 1000) SpO2:  [74 %-100 %] 100 % (12/25 1000) FiO2 (%):  [29.5 %-30.5 %] 30.3 % (12/25 1000) Weight:  [160 lb 11.5 oz (72.9 kg)] 160 lb 11.5 oz (72.9 kg) (12/25  0440) HEMODYNAMICS: CVP:  [3 mmHg-4 mmHg] 3 mmHg VENTILATOR SETTINGS: Vent Mode:  [-] PRVC FiO2 (%):  [29.5 %-30.5 %] 30.3 % Set Rate:  [16 bmp] 16 bmp Vt Set:  [500 mL] 500 mL PEEP:  [5 cmH20] 5 cmH20 Pressure Support:  [12 cmH20] 12 cmH20 Plateau Pressure:  [17 cmH20-18 cmH20] 18 cmH20 INTAKE / OUTPUT: Intake/Output      12/24 0701 - 12/25 0700 12/25 0701 - 12/26 0700   I.V. (mL/kg) 747.7 (10.3) 75 (1)   Other 920 180   NG/GT 250 100   IV Piggyback 50    Total Intake(mL/kg) 1967.7 (27) 355 (4.9)   Urine (mL/kg/hr) 980 (0.6)    Stool 162    Total Output 1142    Net +825.7 +355          JY = BFO PHYSICAL EXAMINATION: Gen: No distress, intermittent cough HEENT: Trach site clean PULM: Decreased breath sounds, no wheeze. CV: s1s2 regular, no murmur. AB: decreased bowel sounds, soft, non tender, PEG site clean Ext: no edema. Derm: no rashes Neuro:  Moves upper extremities  LABS: Cbc  Lab 04/14/12 0500 04/13/12 0400 04/12/12 0400  WBC 14.0* -- --  HGB 7.2* 7.4* 7.9*  HCT 22.3* 22.1* 23.9*  PLT 423* 392 395   Chemistry  Lab 04/14/12 0500 04/13/12 0400 04/12/12 0400 04/10/12 0345  NA 152* 155* 152* --  K 4.0 3.4* 3.3* --  CL 117* 118* 114* --  CO2 24 27 30  --  BUN 51* 50* 54* --  CREATININE 1.38* 1.32  1.21 --  CALCIUM 8.6 8.9 8.8 --  MG 1.9 2.0 -- 2.0  PHOS 3.2 3.1 -- 2.9  GLUCOSE 324* 114* 159* --   Liver fxn No results found for this basename: AST:3,ALT:3,ALKPHOS:3,BILITOT:3,PROT:3,ALBUMIN:3 in the last 168 hours coags  Lab 04/09/12 0415  APTT 38*  INR 1.34   Sepsis markers No results found for this basename: LATICACIDVEN:3,PROCALCITON:3 in the last 168 hours Cardiac markers No results found for this basename: CKTOTAL:3,CKMB:3,TROPONINI:3 in the last 168 hours BNP No results found for this basename: PROBNP:3 in the last 168 hours ABG  Lab 04/10/12 0416 04/09/12 0443 04/08/12 0346  PHART 7.656* 7.624* 7.527*  PCO2ART 27.2* 32.9* 40.3  PO2ART  94.0 102.0* 72.4*  HCO3 30.3* 34.1* 33.3*  TCO2 31 35 34.5   CBG trend  Lab 04/14/12 0903 04/14/12 0408 04/14/12 0017 04/13/12 2324 04/13/12 2014  GLUCAP 176* 243* 231* 201* 181*    Intake/Output Summary (Last 24 hours) at 04/14/12 1014 Last data filed at 04/14/12 1000  Gross per 24 hour  Intake 2059.37 ml  Output    992 ml  Net 1067.37 ml   IMAGING:  No results found.  DIAGNOSES: Principal Problem:  *HCAP (healthcare-associated pneumonia) Active Problems:  DIABETES MELLITUS, TYPE II  HYPERTENSION  Alcohol dependence  ARF (acute renal failure)  Volume depletion  Hyponatremia  Acute respiratory failure with hypoxia  Cardiac arrest  Septic shock(785.52)  Encephalopathy  Tracheostomy status  Protein-calorie malnutrition, severe  Atrial flutter  ASSESSMENT / PLAN:  PULMONARY  ASSESSMENT: Acute respiratory failure in setting of PNA and cardiac arrest with hx of smoking. Suspect anxiety is the major component for respiratory failure at this time. Probable COPD. Failure to wean from vent s/pTstomy  PLAN:   Pressure support/trach collar wean as tolerated Change to pressure control as rest mode 12/25 F/u CXR intermittently PRN combivent  CARDIOVASCULAR  ASSESSMENT:  PEA. Likely related to pneumonia, acidosis, hypoxic respiratory failure in setting of ETOH withdrawal. Septic shock 2nd to PNA. Resolved 12/14. Hypervolemia. Improved. A flutter developed 12/24. PLAN:  Change lopressor to 50 mg per tube q12h Wean off cardizem gtt to keep heart rate < 110  RENAL  ASSESSMENT:   Acute renal failure 2nd to shock. Baseline creatinine 0.95 from 01/05/12.  Lactic acidosis related to shock and liver dysfx. Resolved. Hypokalemia, hypomagnesemia, hypophosphatemia. Hypernatremia. 2nd to diuresis. PLAN:   Continue free water Monitor renal fx, urine outpt, electrolytes  GASTROINTESTINAL  ASSESSMENT:   Protein calorie malnutrition with hx of ETOH. Fatty liver  2nd to ETOH. PLAN:   Continue tube feeds Continue Thiamine, folic acid, MVI.  HEMATOLOGIC  ASSESSMENT:   Anemia, thrombocytopenia likely related to ETOH and chronic disease with critical illness. PLAN:  F/u CBC. Transfuse for Hb < 7. Check anemia panel  INFECTIOUS Blood 12/15>>>negative Sputum 12/14>>>Candida Urine 12/15>>>negative  ASSESSMENT:   Pneumonia Fever 12/24 (Tm 102.5). Poor dentition. PLAN:   Repeat cultures Will need dental evaluation when more stable Hold abx for now Change CVL to PICC line  ENDOCRINE  ASSESSMENT:   DM type II. PLAN:   SSI  NEUROLOGIC  ASSESSMENT:   EtOH withdrawal. Acute encephalopathy 2nd to sepsis, hypoxia, and cardiac arrest with ?anoxic injury.  PLAN:   Fentanyl PRN. Clonazepam for anxiety and withdrawal. Xanax for break through.  GLOBAL - plan for LTAC ?>>refer to [facility]  Critical care time 35 minutes.  Coralyn Helling, MD [facility] Pulmonary/Critical Care 04/14/2012, 10:24 AM Pager:  [phone] After 3pm call: [phone]

## 2012-04-15 ENCOUNTER — Encounter (HOSPITAL_COMMUNITY): Payer: Self-pay | Admitting: Gastroenterology

## 2012-04-15 ENCOUNTER — Inpatient Hospital Stay (HOSPITAL_COMMUNITY): Payer: Medicare PPO

## 2012-04-15 LAB — CBC
HCT: 23.2 % — ABNORMAL LOW (ref 39.0–52.0)
Hemoglobin: 6.8 g/dL — CL (ref 13.0–17.0)
Hemoglobin: 7.8 g/dL — ABNORMAL LOW (ref 13.0–17.0)
Platelets: 411 10*3/uL — ABNORMAL HIGH (ref 150–400)
RBC: 2.13 MIL/uL — ABNORMAL LOW (ref 4.22–5.81)
RBC: 2.47 MIL/uL — ABNORMAL LOW (ref 4.22–5.81)
WBC: 16.6 10*3/uL — ABNORMAL HIGH (ref 4.0–10.5)
WBC: 15.4 10*3/uL — ABNORMAL HIGH (ref 4.0–10.5)

## 2012-04-15 LAB — BASIC METABOLIC PANEL
Calcium: 8.4 mg/dL (ref 8.4–10.5)
GFR calc Af Amer: 63 mL/min — ABNORMAL LOW (ref >90)
GFR calc non Af Amer: 55 mL/min — ABNORMAL LOW (ref >90)
Glucose, Bld: 303 mg/dL — ABNORMAL HIGH (ref 70–99)
Potassium: 3.7 mEq/L (ref 3.5–5.1)
Sodium: 150 mEq/L — ABNORMAL HIGH (ref 135–145)

## 2012-04-15 LAB — GLUCOSE, CAPILLARY
Glucose-Capillary: 174 mg/dL — ABNORMAL HIGH (ref 70–99)
Glucose-Capillary: 227 mg/dL — ABNORMAL HIGH (ref 70–99)
Glucose-Capillary: 75 mg/dL (ref 70–99)

## 2012-04-15 LAB — PREPARE RBC (CROSSMATCH)

## 2012-04-15 MED ORDER — PIPERACILLIN-TAZOBACTAM 3.375 G IVPB
3.3750 g | Freq: Three times a day (TID) | INTRAVENOUS | Status: AC
  Administered 2012-04-15 – 2012-04-20 (×17): 3.375 g via INTRAVENOUS
  Filled 2012-04-15 (×17): qty 50

## 2012-04-15 MED ORDER — MIDAZOLAM HCL 2 MG/2ML IJ SOLN
2.0000 mg | Freq: Once | INTRAMUSCULAR | Status: AC
  Administered 2012-04-15: 2 mg via INTRAVENOUS

## 2012-04-15 MED ORDER — FENTANYL CITRATE 0.05 MG/ML IJ SOLN
100.0000 ug | Freq: Once | INTRAMUSCULAR | Status: AC
  Administered 2012-04-15: 100 ug via INTRAVENOUS

## 2012-04-15 MED ORDER — DILTIAZEM HCL ER COATED BEADS 120 MG PO CP24
120.0000 mg | ORAL_CAPSULE | Freq: Every day | ORAL | Status: DC
  Administered 2012-04-15 – 2012-04-27 (×13): 120 mg via ORAL
  Filled 2012-04-15 (×14): qty 1

## 2012-04-15 MED ORDER — SODIUM CHLORIDE 0.9 % IJ SOLN: 10.0000 mL | INTRAMUSCULAR | Status: DC | PRN

## 2012-04-15 MED ORDER — LABETALOL HCL 5 MG/ML IV SOLN: 10.0000 mg | INTRAVENOUS | Status: DC | PRN

## 2012-04-15 MED ORDER — MIDAZOLAM HCL 2 MG/2ML IJ SOLN
INTRAMUSCULAR | Status: AC
  Administered 2012-04-15: 13:00:00
  Filled 2012-04-15: qty 2

## 2012-04-15 MED ORDER — ROCURONIUM BROMIDE 50 MG/5ML IV SOLN
0.6000 mg/kg | Freq: Once | INTRAVENOUS | Status: DC
  Filled 2012-04-15: qty 4.45

## 2012-04-15 MED ORDER — SODIUM CHLORIDE 0.9 % IJ SOLN
10.0000 mL | Freq: Two times a day (BID) | INTRAMUSCULAR | Status: DC
  Administered 2012-04-15: 20 mL
  Administered 2012-04-15 – 2012-04-26 (×21): 10 mL
  Administered 2012-04-26: 20 mL
  Administered 2012-04-27: 10 mL
  Administered 2012-04-27: 20 mL
  Administered 2012-04-28 – 2012-05-01 (×6): 10 mL
  Administered 2012-05-01: 30 mL
  Administered 2012-05-02: 10 mL
  Administered 2012-05-02: 20 mL
  Administered 2012-05-03 – 2012-05-07 (×9): 10 mL

## 2012-04-15 MED ORDER — VANCOMYCIN HCL 1000 MG IV SOLR
750.0000 mg | Freq: Two times a day (BID) | INTRAVENOUS | Status: DC
  Administered 2012-04-15 – 2012-04-17 (×4): 750 mg via INTRAVENOUS
  Filled 2012-04-15 (×5): qty 750

## 2012-04-15 NOTE — Progress Notes (Signed)
eLink Physician-Brief Progress Note Patient Name: Johnny [name] DOB: 1934-10-04 MRN: [id number]  Date of Service  04/15/2012   HPI/Events of Note  Patient had been on dilt gtt for AF/RVR now on po cardizem.  HR is 115 with BP of 177/68 (93).   eICU Interventions  Plan: PRN labetalol 10 mg IV q2 hour prn SBP greater than 170.   Intervention Category Intermediate Interventions: Hypertension - evaluation and management  DETERDING,ELIZABETH 04/15/2012, 11:17 PM

## 2012-04-15 NOTE — Progress Notes (Signed)
eLink Physician-Brief Progress Note Patient Name: Johnny [name] DOB: 06/08/1934 MRN: [id number]  Date of Service  04/15/2012   HPI/Events of Note  Hgb trending down now 6.8 from 7.2 on subq heparin.   eICU Interventions  Plan; Transfuse 1 unit of pRBC Post-transfusion CBC   Intervention Category Intermediate Interventions: Bleeding - evaluation and treatment with blood products  DETERDING,ELIZABETH 04/15/2012, 5:11 AM

## 2012-04-15 NOTE — Progress Notes (Addendum)
CRITICAL VALUE ALERT  Critical value received:  Hgb 6.8  Date of notification:  04/15/2012  Time of notification:  0500  Critical value read back:yes  Nurse who received alert:  [place] Perkins  MD notified (1st page):  Deterding  Time of first page:  0505  MD notified (2nd page):  Time of second page:  Responding MD:  Deterding  Time MD responded:  0505  Perkins, [place] Elizabeth

## 2012-04-15 NOTE — Progress Notes (Signed)
PULMONARY  / CRITICAL CARE MEDICINE  Name: Johnny [name] MRN: [id number] DOB: 1935/01/25    LOS: 14  REFERRING MD :  TRH  CHIEF COMPLAINT:  Alcohol withdrawal/HCAP >> Cardiac arrest  BRIEF PATIENT DESCRIPTION:  77 yo male former smoker admitted 04/01/2012 with shakes and incontinent of urine/feces and found to have pneumonia in setting of ETOH abuse/withdrawal.  Developed septic shock, and PEA on 12/13 with VDRF and 20 min before ROSC.  PCCM assumed care from 12/13.   LINES / TUBES: 12/12 ETT >>12/20 12/20 Tstomy (JY) >> 12/12 L IJ CVL >> 12/13 Lt radial Aline>>12/14 12/21 PEG ([facility])>>>  CULTURES: 12/12 resp >>GPC in pairs and candida albicans. 12/12 blood >>NTD 12/15 >> coag neg staph  12/12 urine >>negative 12/12 influenza >negative 12/12 urine strep >> negative 12/12 urine leg >>negative 12/25 Sputum with multiple morphology 12/25 B cx x2>>>NTD  ANTIBIOTICS: 12/12 Vanc (HCAP) >>12/15>>>12/26 12/12 Cefepime (HCAP) >>12/15 12/12 Levaqin (HCAP) >>12/20 12/26 Zosyn>>>  SIGNIFICANT EVENTS:  12/12 admission and PEA arrest 12/14 Off pressors 12/20 Trach 12/24 A flutter, cardizem gtt started  TESTS: 12/12 CT head >> chronic microvascular ischemic changes 12/13 EEG >> moderate-to-severe generalized continuous slowing of cerebral activity 12/13 Echo >> EF 50 to 55% (tested while on dobutamine) 12/13 Abd u/s >> hepatic steatosis, medical renal disease, gallbladder sludge with small stones  LEVEL OF CARE:  ICU PRIMARY SERVICE:  PCCM CONSULTANTS:  None CODE STATUS: full  DIET:  Tube feeds DVT Px:  Lovenox GI Px:  Pantoprazole  INTERVAL HISTORY:  Low grade temp.  Remains on cardizem gtt.  VITAL SIGNS: Temp:  [99.3 F (37.4 C)-101.4 F (38.6 C)] 100.6 F (38.1 C) (12/26 0800) Pulse Rate:  [74-118] 112  (12/26 1000) Resp:  [12-33] 24  (12/26 1000) BP: (103-142)/(39-94) 142/66 mmHg (12/26 1000) SpO2:  [98 %-100 %] 100 % (12/26 1000) FiO2 (%):  [29.6 %-30.4  %] 29.9 % (12/26 1000) Weight:  [74.1 kg (163 lb 5.8 oz)] 74.1 kg (163 lb 5.8 oz) (12/26 0435) HEMODYNAMICS:   VENTILATOR SETTINGS: Vent Mode:  [-] CPAP FiO2 (%):  [29.6 %-30.4 %] 29.9 % Set Rate:  [12 bmp] 12 bmp PEEP:  [5 cmH20] 5 cmH20 Pressure Support:  [10 cmH20] 10 cmH20 Plateau Pressure:  [18 cmH20-21 cmH20] 18 cmH20 INTAKE / OUTPUT: Intake/Output      12/25 0701 - 12/26 0700 12/26 0701 - 12/27 0700   I.V. (mL/kg) 609.2 (8.2) 105 (1.4)   Blood  13.5   Other 1440 180   NG/GT 100 250   IV Piggyback     Total Intake(mL/kg) 2149.2 (29) 548.5 (7.4)   Urine (mL/kg/hr) 1410 (0.8) 400   Stool     Total Output 1410 400   Net +739.2 +148.5        Urine Occurrence 1 x      JY = BFO PHYSICAL EXAMINATION: Gen: No distress, intermittent cough HEENT: Trach site clean PULM: Decreased breath sounds, no wheeze. CV: s1s2 regular, no murmur. AB: decreased bowel sounds, soft, non tender, PEG site clean Ext: no edema. Derm: no rashes Neuro:  Moves upper extremities  LABS: Cbc  Lab 04/15/12 0418 04/14/12 0500 04/13/12 0400  WBC 16.6* -- --  HGB 6.8* 7.2* 7.4*  HCT 21.1* 22.3* 22.1*  PLT 411* 423* 392   Chemistry  Lab 04/15/12 0418 04/14/12 0500 04/13/12 0400 04/10/12 0345  NA 150* 152* 155* --  K 3.7 4.0 3.4* --  CL 114* 117* 118* --  CO2  25 24 27  --  BUN 35* 51* 50* --  CREATININE 1.25 1.38* 1.32 --  CALCIUM 8.4 8.6 8.9 --  MG -- 1.9 2.0 2.0  PHOS -- 3.2 3.1 2.9  GLUCOSE 303* 324* 114* --   Liver fxn No results found for this basename: AST:3,ALT:3,ALKPHOS:3,BILITOT:3,PROT:3,ALBUMIN:3 in the last 168 hours coags  Lab 04/09/12 0415  APTT 38*  INR 1.34   Sepsis markers No results found for this basename: LATICACIDVEN:3,PROCALCITON:3 in the last 168 hours Cardiac markers No results found for this basename: CKTOTAL:3,CKMB:3,TROPONINI:3 in the last 168 hours BNP No results found for this basename: PROBNP:3 in the last 168 hours ABG  Lab 04/10/12 0416 04/09/12  0443  PHART 7.656* 7.624*  PCO2ART 27.2* 32.9*  PO2ART 94.0 102.0*  HCO3 30.3* 34.1*  TCO2 31 35   CBG trend  Lab 04/15/12 0751 04/15/12 0356 04/14/12 2353 04/14/12 2009 04/14/12 1813  GLUCAP 289* 227* 75 260* 230*    Intake/Output Summary (Last 24 hours) at 04/15/12 1028 Last data filed at 04/15/12 1000  Gross per 24 hour  Intake 2342.67 ml  Output   1810 ml  Net 532.67 ml   IMAGING:  Dg Chest Port 1 View  04/15/2012  *RADIOLOGY REPORT*  Clinical Data: Follow up atelectasis.  Check tube position.  PORTABLE CHEST - 1 VIEW  Comparison: One-view chest 04/12/2012.  Findings: The tracheostomy tube and right subclavian line are stable and in satisfactory position.  The NG tube has been removed. Minimal right basilar atelectasis is present.  The lung volumes remain low.  No other focal airspace disease is evident.  IMPRESSION:  1.  Mild right basilar airspace disease likely reflects atelectasis. 2.  Persistent low lung volumes. 3.  The support apparatus is stable and in satisfactory position.   Original Report Authenticated By: Marin Roberts, M.D.     DIAGNOSES: Principal Problem:  *HCAP (healthcare-associated pneumonia) Active Problems:  DIABETES MELLITUS, TYPE II  HYPERTENSION  Alcohol dependence  ARF (acute renal failure)  Volume depletion  Hyponatremia  Acute respiratory failure with hypoxia  Cardiac arrest  Septic shock(785.52)  Encephalopathy  Tracheostomy status  Protein-calorie malnutrition, severe  Atrial flutter  ASSESSMENT / PLAN:  PULMONARY  ASSESSMENT: Acute respiratory failure in setting of PNA and cardiac arrest with hx of smoking. Suspect anxiety is the major component for respiratory failure at this time. Probable COPD. Failure to wean from vent s/pTstomy  PLAN:   Pressure support/trach collar wean as tolerated Change to pressure control as rest mode 12/25 F/u CXR intermittently PRN combivent See ID section for  abx.  CARDIOVASCULAR  ASSESSMENT:  PEA. Likely related to pneumonia, acidosis, hypoxic respiratory failure in setting of ETOH withdrawal. Septic shock 2nd to PNA. Resolved 12/14. Hypervolemia. Improved. A flutter developed 12/24. PLAN:  Change lopressor to 50 mg per tube q12h Wean off cardizem gtt to keep heart rate < 110 and start PO at a low dose.  RENAL  ASSESSMENT:   Acute renal failure 2nd to shock. Baseline creatinine 0.95 from 01/05/12.  Lactic acidosis related to shock and liver dysfx. Resolved. Hypokalemia, hypomagnesemia, hypophosphatemia. Hypernatremia. 2nd to diuresis. PLAN:   Continue free water Hold lasix for now. Monitor renal fx, urine outpt, electrolytes  GASTROINTESTINAL  ASSESSMENT:   Protein calorie malnutrition with hx of ETOH. Fatty liver 2nd to ETOH. PLAN:   Continue tube feeds Continue Thiamine, folic acid, MVI.  HEMATOLOGIC  ASSESSMENT:   Anemia, thrombocytopenia likely related to ETOH and chronic disease with critical illness. PLAN:  F/u  CBC. Transfuse for Hb < 7. Check anemia panel  INFECTIOUS  ASSESSMENT:   Pneumonia Fever 12/24 (Tm 102.5). Poor dentition. PLAN:   Repeat cultures as above Will need dental evaluation when more stable Restart vanc and dose zosyn. Change CVL to PICC line  ENDOCRINE  ASSESSMENT:   DM type II. PLAN:   SSI  NEUROLOGIC  ASSESSMENT:   EtOH withdrawal. Acute encephalopathy 2nd to sepsis, hypoxia, and cardiac arrest with ?anoxic injury.  PLAN:   Fentanyl PRN. Clonazepam for anxiety and withdrawal. Xanax for break through.  GLOBAL - plan for LTAC ?>>refer to [facility]  Critical care time 35 minutes.  Alyson Reedy, M.D. [facility] Pulmonary/Critical Care Medicine. Pager: [phone]. After hours pager: [phone].

## 2012-04-15 NOTE — Progress Notes (Signed)
DR. Craige Cotta NOTIFIED OF TEMP.101.0 PRIOR TO TRANSFUSION.  ORDER RECEIVED TO TRANSFUSE.

## 2012-04-15 NOTE — Progress Notes (Signed)
ANTIBIOTIC CONSULT NOTE - INITIAL  Pharmacy Consult for vanc/zosyn Indication: HCAP  No Known Allergies  Patient Measurements: Height: 5' 10.5" (179.1 cm) Weight: 163 lb 5.8 oz (74.1 kg) IBW/kg (Calculated) : 74.15    Vital Signs: Temp: 100.6 F (38.1 C) (12/26 0800) Temp src: Oral (12/26 0600) BP: 127/66 mmHg (12/26 1135) Pulse Rate: 117  (12/26 1135) Intake/Output from previous day: 12/25 0701 - 12/26 0700 In: 2149.2 [I.V.:609.2; NG/GT:100] Out: 1410 [Urine:1410] Intake/Output from this shift: Total I/O In: 548.5 [I.V.:105; Blood:13.5; Other:180; NG/GT:250] Out: 400 [Urine:400]  Labs:  Basename 04/15/12 0418 04/14/12 0500 04/13/12 0400  WBC 16.6* 14.0* 14.3*  HGB 6.8* 7.2* 7.4*  PLT 411* 423* 392  LABCREA -- -- --  CREATININE 1.25 1.38* 1.32   Estimated Creatinine Clearance: 53.5 ml/min (by C-G formula based on Cr of 1.25). No results found for this basename: VANCOTROUGH:2,VANCOPEAK:2,VANCORANDOM:2,GENTTROUGH:2,GENTPEAK:2,GENTRANDOM:2,TOBRATROUGH:2,TOBRAPEAK:2,TOBRARND:2,AMIKACINPEAK:2,AMIKACINTROU:2,AMIKACIN:2, in the last 72 hours   Microbiology: Recent Results (from the past 720 hour(s))  URINE CULTURE     Status: Normal   Collection Time   04/01/12  2:40 PM      Component Value Range Status Comment   Specimen Description URINE, CATHETERIZED   Final    Special Requests NONE   Final    Culture  Setup Time 04/01/2012 15:34   Final    Colony Count NO GROWTH   Final    Culture NO GROWTH   Final    Report Status 04/02/2012 FINAL   Final   MRSA PCR SCREENING     Status: Normal   Collection Time   04/01/12 11:54 PM      Component Value Range Status Comment   MRSA by PCR NEGATIVE  NEGATIVE Final   CULTURE, BLOOD (ROUTINE X 2)     Status: Normal   Collection Time   04/02/12  1:18 AM      Component Value Range Status Comment   Specimen Description BLOOD CENTRAL LINE   Final    Special Requests BOTTLES DRAWN AEROBIC AND ANAEROBIC [facility] EACH   Final    Culture   Setup Time 04/02/2012 14:26   Final    Culture NO GROWTH 5 DAYS   Final    Report Status 04/08/2012 FINAL   Final   CULTURE, BLOOD (ROUTINE X 2)     Status: Normal   Collection Time   04/02/12  1:30 AM      Component Value Range Status Comment   Specimen Description BLOOD RIGHT HAND   Final    Special Requests BOTTLES DRAWN AEROBIC ONLY 1.5CC   Final    Culture  Setup Time 04/02/2012 14:26   Final    Culture NO GROWTH 5 DAYS   Final    Report Status 04/08/2012 FINAL   Final   URINE CULTURE     Status: Normal   Collection Time   04/02/12  2:04 AM      Component Value Range Status Comment   Specimen Description URINE, CATHETERIZED   Final    Special Requests Normal   Final    Culture  Setup Time 04/02/2012 08:25   Final    Colony Count NO GROWTH   Final    Culture NO GROWTH   Final    Report Status 04/03/2012 FINAL   Final   CULTURE, RESPIRATORY     Status: Normal   Collection Time   04/03/12 10:17 PM      Component Value Range Status Comment   Specimen Description TRACHEAL ASPIRATE  Final    Special Requests Normal   Final    Gram Stain     Final    Value: ABUNDANT WBC PRESENT, PREDOMINANTLY PMN     ABUNDANT YEAST     FEW GRAM POSITIVE COCCI IN PAIRS   Culture ABUNDANT CANDIDA ALBICANS   Final    Report Status 04/06/2012 FINAL   Final   CULTURE, BLOOD (ROUTINE X 2)     Status: Normal   Collection Time   04/04/12  4:23 PM      Component Value Range Status Comment   Specimen Description BLOOD RIGHT ARM   Final    Special Requests BOTTLES DRAWN AEROBIC AND ANAEROBIC 10CC   Final    Culture  Setup Time 04/04/2012 21:02   Final    Culture NO GROWTH 5 DAYS   Final    Report Status 04/10/2012 FINAL   Final   CULTURE, BLOOD (ROUTINE X 2)     Status: Normal   Collection Time   04/04/12  4:35 PM      Component Value Range Status Comment   Specimen Description BLOOD RIGHT HAND   Final    Special Requests BOTTLES DRAWN AEROBIC ONLY 2CC   Final    Culture  Setup Time 04/04/2012  21:02   Final    Culture     Final    Value: STAPHYLOCOCCUS SPECIES (COAGULASE NEGATIVE)     Note: THE SIGNIFICANCE OF ISOLATING THIS ORGANISM FROM A SINGLE SET OF BLOOD CULTURES WHEN MULTIPLE SETS ARE DRAWN IS UNCERTAIN. PLEASE NOTIFY THE MICROBIOLOGY DEPARTMENT WITHIN ONE WEEK IF SPECIATION AND SENSITIVITIES ARE REQUIRED.     Note: Gram Stain Report Called to,Read Back By and Verified With: SHANNA STOWE @0420AM  ON 04/06/12 BY MCLET   Report Status 04/07/2012 FINAL   Final   CULTURE, RESPIRATORY     Status: Normal (Preliminary result)   Collection Time   04/14/12 12:10 PM      Component Value Range Status Comment   Specimen Description TRACHEAL ASPIRATE   Final    Special Requests NONE   Final    Gram Stain     Final    Value: MODERATE WBC PRESENT,BOTH PMN AND MONONUCLEAR     RARE SQUAMOUS EPITHELIAL CELLS PRESENT     FEW GRAM POSITIVE RODS     FEW GRAM NEGATIVE RODS     RARE GRAM VARIABLE ROD   Culture Culture reincubated for better growth   Final    Report Status PENDING   Incomplete   CULTURE, BLOOD (ROUTINE X 2)     Status: Normal (Preliminary result)   Collection Time   04/14/12  1:00 PM      Component Value Range Status Comment   Specimen Description BLOOD HAND RIGHT   Final    Special Requests BOTTLES DRAWN AEROBIC ONLY 4.0CC   Final    Culture  Setup Time 04/14/2012 22:26   Final    Culture     Final    Value:        BLOOD CULTURE RECEIVED NO GROWTH TO DATE CULTURE WILL BE HELD FOR 5 DAYS BEFORE ISSUING A FINAL NEGATIVE REPORT   Report Status PENDING   Incomplete   CULTURE, BLOOD (ROUTINE X 2)     Status: Normal (Preliminary result)   Collection Time   04/14/12  1:20 PM      Component Value Range Status Comment   Specimen Description BLOOD HAND LEFT   Final    Special Requests  BOTTLES DRAWN AEROBIC ONLY 6.0CC   Final    Culture  Setup Time 04/14/2012 22:26   Final    Culture     Final    Value:        BLOOD CULTURE RECEIVED NO GROWTH TO DATE CULTURE WILL BE HELD FOR 5  DAYS BEFORE ISSUING A FINAL NEGATIVE REPORT   Report Status PENDING   Incomplete   URINE CULTURE     Status: Normal (Preliminary result)   Collection Time   04/14/12  1:57 PM      Component Value Range Status Comment   Specimen Description URINE, RANDOM   Final    Special Requests NONE   Final    Culture  Setup Time 04/14/2012 15:33   Final    Colony Count PENDING   Incomplete    Culture Culture reincubated for better growth   Final    Report Status PENDING   Incomplete     Medical History: Past Medical History  Diagnosis Date  . Acute alcoholic hepatitis 05/20/2010  . Anemia of other chronic disease 08/16/2007  . Atrial fibrillation 08/12/2007  . CORONARY ARTERY DISEASE 12/11/2006  . HYPERTENSION 12/11/2006  . Hyperlipidemia   . ETOH abuse   . Chronic low back pain   . Pneumonia     "now; never before" (04/01/2012)  . Hallucination 04/01/2012  . Exertional dyspnea   . DIABETES MELLITUS, TYPE II 12/11/2006  . Arthritis     "think that's what's in my back" (04/01/2012)  . Altered mental status 04/01/2012  . Seizures 04/01/2012    seizure-like activity @ home today/notes 04/01/2012     Medications:  Scheduled:    . antiseptic oral rinse  15 mL Mouth Rinse QID  . chlorhexidine  15 mL Mouth Rinse BID  . clonazePAM  1 mg Oral BID  . diltiazem  120 mg Oral Daily  . fentaNYL  100 mcg Intravenous Once  . folic acid  1 mg Oral Daily  . free water  250 mL Per Tube Q4H  . heparin subcutaneous  5,000 Units Subcutaneous Q8H  . insulin aspart  0-15 Units Subcutaneous Q4H  . insulin aspart  4 Units Subcutaneous Q4H  . metoprolol tartrate  50 mg Per Tube BID  . midazolam      . midazolam  2 mg Intravenous Once  . multivitamin  5 mL Oral Daily  . pantoprazole sodium  40 mg Per Tube Q24H  . piperacillin-tazobactam (ZOSYN)  IV  3.375 g Intravenous Q8H  . rocuronium  0.6 mg/kg Intravenous Once  . sodium chloride  10-40 mL Intracatheter Q12H  . thiamine  100 mg Oral Daily  .  vancomycin  750 mg Intravenous Q12H   Assessment: Johnny [name] admitted 04/01/2012 found to have pneumonia in setting of ETOH abuse/withdrawal. Developed septic shock, and PEA on 12/13. Pharmacy consult to reinitiate  vancomycin and start zosyn. WBC trend up 16.6 Tmax 100.6. Scr 1.25 est crcl 53 ml/min.  Goal of Therapy:  Vancomycin trough level 15-20 mcg/ml  Plan:  1. Initiate zosyn 3.375 G IV q8h 2. Reinitiate vancomycin 750 mg Iv q12h 3. Monitor renal function clinical progress  Bola A. Wandra Feinstein D Clinical Pharmacist Pager:[phone] Phone [phone] 04/15/2012 12:07 PM

## 2012-04-15 NOTE — Progress Notes (Signed)
Peripherally Inserted Central Catheter/Midline Placement  The IV Nurse has discussed with the patient and/or persons authorized to consent for the patient, the purpose of this procedure and the potential benefits and risks involved with this procedure.  The benefits include less needle sticks, lab draws from the catheter and patient may be discharged home with the catheter.  Risks include, but not limited to, infection, bleeding, blood clot (thrombus formation), and puncture of an artery; nerve damage and irregular heat beat.  Alternatives to this procedure were also discussed.  PICC/Midline Placement Documentation  PICC / Midline Double Lumen 04/15/12 PICC Right Basilic (Active)    Telephone consent   Stacie Glaze Horton 04/15/2012, 11:36 AM

## 2012-04-16 LAB — CULTURE, RESPIRATORY W GRAM STAIN

## 2012-04-16 LAB — GLUCOSE, CAPILLARY
Glucose-Capillary: 129 mg/dL — ABNORMAL HIGH (ref 70–99)
Glucose-Capillary: 193 mg/dL — ABNORMAL HIGH (ref 70–99)
Glucose-Capillary: 219 mg/dL — ABNORMAL HIGH (ref 70–99)
Glucose-Capillary: 226 mg/dL — ABNORMAL HIGH (ref 70–99)

## 2012-04-16 LAB — CBC
HCT: 21.6 % — ABNORMAL LOW (ref 39.0–52.0)
Hemoglobin: 7.4 g/dL — ABNORMAL LOW (ref 13.0–17.0)
MCH: 32.2 pg (ref 26.0–34.0)
MCV: 93.9 fL (ref 78.0–100.0)
Platelets: 327 10*3/uL (ref 150–400)
RBC: 2.3 MIL/uL — ABNORMAL LOW (ref 4.22–5.81)
WBC: 13.9 10*3/uL — ABNORMAL HIGH (ref 4.0–10.5)

## 2012-04-16 LAB — BASIC METABOLIC PANEL
BUN: 28 mg/dL — ABNORMAL HIGH (ref 6–23)
CO2: 22 mEq/L (ref 19–32)
Calcium: 8.1 mg/dL — ABNORMAL LOW (ref 8.4–10.5)
Chloride: 110 mEq/L (ref 96–112)
Creatinine, Ser: 1.22 mg/dL (ref 0.50–1.35)
Glucose, Bld: 223 mg/dL — ABNORMAL HIGH (ref 70–99)

## 2012-04-16 LAB — MAGNESIUM: Magnesium: 1.4 mg/dL — ABNORMAL LOW (ref 1.5–2.5)

## 2012-04-16 MED ORDER — MAGNESIUM SULFATE 40 MG/ML IJ SOLN
2.0000 g | Freq: Once | INTRAMUSCULAR | Status: AC
  Administered 2012-04-16: 2 g via INTRAVENOUS
  Filled 2012-04-16: qty 50

## 2012-04-16 MED ORDER — POTASSIUM CHLORIDE 20 MEQ/15ML (10%) PO LIQD
40.0000 meq | ORAL | Status: AC
  Administered 2012-04-16 (×2): 40 meq
  Filled 2012-04-16 (×2): qty 30

## 2012-04-16 MED ORDER — FUROSEMIDE 10 MG/ML IJ SOLN
INTRAMUSCULAR | Status: AC
  Administered 2012-04-16: 10 mg
  Filled 2012-04-16: qty 4

## 2012-04-16 MED ORDER — FUROSEMIDE 10 MG/ML IJ SOLN
40.0000 mg | Freq: Three times a day (TID) | INTRAMUSCULAR | Status: AC
  Administered 2012-04-16 (×2): 40 mg via INTRAVENOUS
  Filled 2012-04-16: qty 4

## 2012-04-16 MED ORDER — POTASSIUM PHOSPHATE DIBASIC 3 MMOLE/ML IV SOLN
30.0000 mmol | Freq: Once | INTRAVENOUS | Status: AC
  Administered 2012-04-16: 30 mmol via INTRAVENOUS
  Filled 2012-04-16: qty 10

## 2012-04-16 NOTE — Progress Notes (Signed)
Inpatient Diabetes Program Recommendations  AACE/ADA: New Consensus Statement on Inpatient Glycemic Control (2013)  Target Ranges:  Prepandial:   less than 140 mg/dL      Peak postprandial:   less than 180 mg/dL (1-2 hours)      Critically ill patients:  140 - 180 mg/dL   Results for KIMOTHY, KISHIMOTO (MRN [id number]) as of 04/16/2012 10:59  Ref. Range 04/15/2012 03:56 04/15/2012 07:51 04/15/2012 12:41 04/15/2012 16:05 04/15/2012 20:52 04/15/2012 23:41 04/16/2012 04:20  Glucose-Capillary Latest Range: 70-99 mg/dL 782 (H) 956 (H) 213 (H) 226 (H) 239 (H) 219 (H) 193 (H)    Inpatient Diabetes Program Recommendations Insulin - Meal Coverage: Please consider increasing tube feeding coverage to Novolog 6 units Q4H.  Note: Patient is currently on Novolog moderate correction Q4H and Novolog 4 units Q4H for tube feeding to maintain glycemic control.  Since blood glucose remains elevated, please consider increasing tube feeding coverage to Novolog 6 units Q4H.  If tube feeding coverage is increased and blood glucose remains elevated, may want to consider increasing the correction scale to resistant.  Will continue to follow.  Thanks, Orlando Penner, RN, BSN, CCRN Diabetes Coordinator Inpatient Diabetes Program [phone]

## 2012-04-16 NOTE — Progress Notes (Signed)
PULMONARY  / CRITICAL CARE MEDICINE  Name: Johnny [name] MRN: [id number] DOB: 24-Mar-1935    LOS: 15  REFERRING MD :  TRH  CHIEF COMPLAINT:  Alcohol withdrawal/HCAP >> Cardiac arrest  BRIEF PATIENT DESCRIPTION:  77 yo male former smoker admitted 04/01/2012 with shakes and incontinent of urine/feces and found to have pneumonia in setting of ETOH abuse/withdrawal.  Developed septic shock, and PEA on 12/13 with VDRF and 20 min before ROSC.  PCCM assumed care from 12/13.   LINES / TUBES: 12/12 ETT >>12/20 12/20 Tstomy (JY) >> 12/12 L IJ CVL >> 12/13 Lt radial Aline>>12/14 12/21 PEG ([facility])>>>  CULTURES: 12/12 resp >>GPC in pairs and candida albicans. 12/12 blood >>NTD 12/15 >> coag neg staph  12/12 urine >>negative 12/12 influenza >negative 12/12 urine strep >> negative 12/12 urine leg >>negative 12/25 Sputum with multiple morphology 12/25 B cx x2>>>NTD  ANTIBIOTICS: 12/12 Vanc (HCAP) >>12/15>>>12/26>>> 12/12 Cefepime (HCAP) >>12/15 12/12 Levaqin (HCAP) >>12/20 12/26 Zosyn>>>  SIGNIFICANT EVENTS:  12/12 admission and PEA arrest 12/14 Off pressors 12/20 Trach 12/24 A flutter, cardizem gtt started  TESTS: 12/12 CT head >> chronic microvascular ischemic changes 12/13 EEG >> moderate-to-severe generalized continuous slowing of cerebral activity 12/13 Echo >> EF 50 to 55% (tested while on dobutamine) 12/13 Abd u/s >> hepatic steatosis, medical renal disease, gallbladder sludge with small stones  LEVEL OF CARE:  ICU PRIMARY SERVICE:  PCCM CONSULTANTS:  None CODE STATUS: full  DIET:  Tube feeds DVT Px:  Lovenox GI Px:  Pantoprazole  INTERVAL HISTORY:  Low grade temp.  Remains on cardizem gtt.  VITAL SIGNS: Temp:  [99.4 F (37.4 C)-103.9 F (39.9 C)] 99.9 F (37.7 C) (12/27 0800) Pulse Rate:  [31-118] 102  (12/27 1000) Resp:  [15-31] 19  (12/27 1000) BP: (127-177)/(41-101) 134/53 mmHg (12/27 1000) SpO2:  [97 %-100 %] 100 % (12/27 1000) FiO2 (%):  [29.7  %-30.4 %] 30.1 % (12/27 1000) Weight:  [75.5 kg (166 lb 7.2 oz)] 75.5 kg (166 lb 7.2 oz) (12/27 0400) HEMODYNAMICS:   VENTILATOR SETTINGS: Vent Mode:  [-] PCV FiO2 (%):  [29.7 %-30.4 %] 30.1 % Set Rate:  [18 bmp] 18 bmp PEEP:  [5 cmH20] 5 cmH20 Plateau Pressure:  [18 cmH20-20 cmH20] 19 cmH20 INTAKE / OUTPUT: Intake/Output      12/26 0701 - 12/27 0700 12/27 0701 - 12/28 0700   I.V. (mL/kg) 390 (5.2)    Blood 13.5    Other 1320 60   NG/GT 750 250   IV Piggyback 450    Total Intake(mL/kg) 2923.5 (38.7) 310 (4.1)   Urine (mL/kg/hr) 1500 (0.8)    Total Output 1500    Net +1423.5 +310          JY = BFO PHYSICAL EXAMINATION: Gen: No distress, intermittent cough HEENT: Trach site clean PULM: Decreased breath sounds, no wheeze. CV: s1s2 regular, no murmur. AB: decreased bowel sounds, soft, non tender, PEG site clean Ext: no edema. Derm: no rashes Neuro:  Moves upper extremities  LABS: Cbc  Lab 04/16/12 0510 04/15/12 1441 04/15/12 0418  WBC 13.9* -- --  HGB 7.4* 7.8* 6.8*  HCT 21.6* 23.2* 21.1*  PLT 327 359 411*   Chemistry  Lab 04/16/12 0510 04/15/12 0418 04/14/12 0500 04/13/12 0400  NA 145 150* 152* --  K 3.0* 3.7 4.0 --  CL 110 114* 117* --  CO2 22 25 24  --  BUN 28* 35* 51* --  CREATININE 1.22 1.25 1.38* --  CALCIUM 8.1* 8.4  8.6 --  MG 1.4* -- 1.9 2.0  PHOS 2.3 -- 3.2 3.1  GLUCOSE 223* 303* 324* --   Liver fxn No results found for this basename: AST:3,ALT:3,ALKPHOS:3,BILITOT:3,PROT:3,ALBUMIN:3 in the last 168 hours coags No results found for this basename: APTT:3,INR:3 in the last 168 hours Sepsis markers No results found for this basename: LATICACIDVEN:3,PROCALCITON:3 in the last 168 hours Cardiac markers No results found for this basename: CKTOTAL:3,CKMB:3,TROPONINI:3 in the last 168 hours BNP No results found for this basename: PROBNP:3 in the last 168 hours ABG  Lab 04/10/12 0416  PHART 7.656*  PCO2ART 27.2*  PO2ART 94.0  HCO3 30.3*  TCO2 31    CBG trend  Lab 04/16/12 1157 04/16/12 0420 04/15/12 2341 04/15/12 2052 04/15/12 1605  GLUCAP 129* 193* 219* 239* 226*    Intake/Output Summary (Last 24 hours) at 04/16/12 1226 Last data filed at 04/16/12 0800  Gross per 24 hour  Intake   2495 ml  Output   1100 ml  Net   1395 ml   IMAGING:  Dg Chest Port 1 View  04/15/2012  *RADIOLOGY REPORT*  Clinical Data: Follow up atelectasis.  Check tube position.  PORTABLE CHEST - 1 VIEW  Comparison: One-view chest 04/12/2012.  Findings: The tracheostomy tube and right subclavian line are stable and in satisfactory position.  The NG tube has been removed. Minimal right basilar atelectasis is present.  The lung volumes remain low.  No other focal airspace disease is evident.  IMPRESSION:  1.  Mild right basilar airspace disease likely reflects atelectasis. 2.  Persistent low lung volumes. 3.  The support apparatus is stable and in satisfactory position.   Original Report Authenticated By: Marin Roberts, M.D.     DIAGNOSES: Principal Problem:  *HCAP (healthcare-associated pneumonia) Active Problems:  DIABETES MELLITUS, TYPE II  HYPERTENSION  Alcohol dependence  ARF (acute renal failure)  Volume depletion  Hyponatremia  Acute respiratory failure with hypoxia  Cardiac arrest  Septic shock(785.52)  Encephalopathy  Tracheostomy status  Protein-calorie malnutrition, severe  Atrial flutter  ASSESSMENT / PLAN:  PULMONARY  ASSESSMENT: Acute respiratory failure in setting of PNA and cardiac arrest with hx of smoking. Suspect anxiety is the major component for respiratory failure at this time. Probable COPD. Failure to wean from vent s/pTstomy  PLAN:   Pressure support. Diureses as below. Change to pressure control as rest mode 12/25. F/u CXR intermittently. PRN combivent. See ID section for abx.  CARDIOVASCULAR  ASSESSMENT:  PEA. Likely related to pneumonia, acidosis, hypoxic respiratory failure in setting of ETOH  withdrawal. Septic shock 2nd to PNA. Resolved 12/14. Hypervolemia. Improved. A flutter developed 12/24. PLAN:  Change lopressor to 50 mg per tube q12h Stable with PO dilt will d/c drip.  RENAL  ASSESSMENT:   Acute renal failure 2nd to shock. Baseline creatinine 0.95 from 01/05/12.  Lactic acidosis related to shock and liver dysfx. Resolved. Hypokalemia, hypomagnesemia, hypophosphatemia. Hypernatremia. 2nd to diuresis. PLAN:   Continue free water Lasix as ordered. Replace K, Mg and Phos. Monitor renal fx, urine outpt, electrolytes  GASTROINTESTINAL  ASSESSMENT:   Protein calorie malnutrition with hx of ETOH. Fatty liver 2nd to ETOH. PLAN:   Continue tube feeds Continue Thiamine, folic acid, MVI.  HEMATOLOGIC  ASSESSMENT:   Anemia, thrombocytopenia likely related to ETOH and chronic disease with critical illness. PLAN:  F/u CBC. Transfuse for Hb < 7. Check anemia panel  INFECTIOUS  ASSESSMENT:   Pneumonia Fever 12/24 (Tm 102.5). Poor dentition. PLAN:   Repeat cultures as above Will  need dental evaluation when more stable Restart vanc and dose zosyn. Change CVL to PICC line  ENDOCRINE  ASSESSMENT:   DM type II. PLAN:   SSI  NEUROLOGIC  ASSESSMENT:   EtOH withdrawal. Acute encephalopathy 2nd to sepsis, hypoxia, and cardiac arrest with ?anoxic injury.  PLAN:   Fentanyl PRN. Clonazepam for anxiety and withdrawal. Xanax for break through.  Alyson Reedy, M.D. [facility] Pulmonary/Critical Care Medicine. Pager: [phone]. After hours pager: [phone].

## 2012-04-16 NOTE — Progress Notes (Signed)
eLink Physician-Brief Progress Note Patient Name: Johnny [name] DOB: 12-Dec-1934 MRN: [id number]  Date of Service  04/16/2012   HPI/Events of Note  Hypokalemia  eICU Interventions  Potassium replaced   Intervention Category Minor Interventions: Electrolytes abnormality - evaluation and management  Liyanna Cartwright 04/16/2012, 6:30 AM

## 2012-04-16 NOTE — Clinical Social Work Psychosocial (Signed)
     Clinical Social Work Department BRIEF PSYCHOSOCIAL ASSESSMENT 04/16/2012  Patient:  Johnny [name], Johnny [name]     Account Number:  [id number]     Admit date:  04/01/2012  Clinical Social Worker:  Hulan Fray  Date/Time:  04/16/2012 02:55 PM  Referred by:  Care Management  Date Referred:  04/16/2012 Referred for  Other - See comment   Other Referral:   [facility] SNF potential   Interview type:  Family Other interview type:   Daughter, Johnny [name]    PSYCHOSOCIAL DATA Living Status:  FAMILY Admitted from facility:   Level of care:   Primary support name:  Johnny [name] Primary support relationship to patient:  CHILD, ADULT Degree of support available:   supportive    CURRENT CONCERNS Current Concerns  Post-Acute Placement   Other Concerns:    SOCIAL WORK ASSESSMENT / PLAN Clinical Social Worker received referral for potential vent snf placement for patient pending ability to wean off ventilator. CSW went to patient's room, but no family was at bedside.    CSW spoke with daughter, Johnny [name] via telephone regarding potential vent snf need. Per daughter, she is able to meet with CSW Monday 04/19/12 at 8:30am to discuss this possible plan for discharge. CSW briefly explained that [place] might have more availability than  [place] facilities regarding vent snf's. Daughter reported that she understood. CSW informed daughter that referral will be placed both in [place] and [place] areas.    CSW will meet with daughter and other family members who wish to be present to discuss discharge option for vent snf.   Assessment/plan status:   Other assessment/ plan:   Information/referral to community resources:   Will provide vent snf packet    PATIENTS/FAMILYS RESPONSE TO PLAN OF CARE: Daughter voiced that she understood the potential need for vent snf and that availability could be in [place] and not [place] facilities. Daughter will meet with CSW Monday 04/19/12 to discuss this option  further.

## 2012-04-17 LAB — CBC
Hemoglobin: 7.4 g/dL — ABNORMAL LOW (ref 13.0–17.0)
MCH: 31.4 pg (ref 26.0–34.0)
MCV: 94.5 fL (ref 78.0–100.0)
Platelets: 349 10*3/uL (ref 150–400)
RBC: 2.36 MIL/uL — ABNORMAL LOW (ref 4.22–5.81)
WBC: 11.9 10*3/uL — ABNORMAL HIGH (ref 4.0–10.5)

## 2012-04-17 LAB — MAGNESIUM: Magnesium: 1.4 mg/dL — ABNORMAL LOW (ref 1.5–2.5)

## 2012-04-17 LAB — BASIC METABOLIC PANEL
CO2: 25 mEq/L (ref 19–32)
Calcium: 7.8 mg/dL — ABNORMAL LOW (ref 8.4–10.5)
Chloride: 105 mEq/L (ref 96–112)
Glucose, Bld: 210 mg/dL — ABNORMAL HIGH (ref 70–99)
Sodium: 142 mEq/L (ref 135–145)

## 2012-04-17 LAB — GLUCOSE, CAPILLARY
Glucose-Capillary: 127 mg/dL — ABNORMAL HIGH (ref 70–99)
Glucose-Capillary: 173 mg/dL — ABNORMAL HIGH (ref 70–99)
Glucose-Capillary: 179 mg/dL — ABNORMAL HIGH (ref 70–99)
Glucose-Capillary: 181 mg/dL — ABNORMAL HIGH (ref 70–99)
Glucose-Capillary: 223 mg/dL — ABNORMAL HIGH (ref 70–99)

## 2012-04-17 LAB — PHOSPHORUS: Phosphorus: 3.3 mg/dL (ref 2.3–4.6)

## 2012-04-17 MED ORDER — MAGNESIUM SULFATE 40 MG/ML IJ SOLN
4.0000 g | Freq: Once | INTRAMUSCULAR | Status: AC
  Administered 2012-04-17: 4 g via INTRAVENOUS
  Filled 2012-04-17: qty 100

## 2012-04-17 MED ORDER — POTASSIUM CHLORIDE 20 MEQ/15ML (10%) PO LIQD
40.0000 meq | Freq: Three times a day (TID) | ORAL | Status: AC
  Administered 2012-04-17: 40 meq
  Filled 2012-04-17 (×3): qty 30

## 2012-04-17 MED ORDER — MAGNESIUM SULFATE 40 MG/ML IJ SOLN
2.0000 g | Freq: Once | INTRAMUSCULAR | Status: AC
  Administered 2012-04-17: 2 g via INTRAVENOUS
  Filled 2012-04-17: qty 50

## 2012-04-17 MED ORDER — FUROSEMIDE 10 MG/ML IJ SOLN
40.0000 mg | Freq: Three times a day (TID) | INTRAMUSCULAR | Status: AC
  Administered 2012-04-17 (×2): 40 mg via INTRAVENOUS
  Filled 2012-04-17 (×2): qty 4

## 2012-04-17 MED ORDER — POTASSIUM CHLORIDE 20 MEQ/15ML (10%) PO LIQD
ORAL | Status: AC
  Administered 2012-04-17: 40 meq
  Filled 2012-04-17: qty 30

## 2012-04-17 NOTE — Progress Notes (Signed)
eLink Physician-Brief Progress Note Patient Name: Johnny [name] DOB: [birth year] MRN: [id number]  Date of Service  04/17/2012   HPI/Events of Note   Hypomag  eICU Interventions  Mag replaced   Intervention Category Intermediate Interventions: Electrolyte abnormality - evaluation and management  DETERDING,ELIZABETH 04/17/2012, 4:32 AM

## 2012-04-17 NOTE — Progress Notes (Signed)
elink md Dr. Herma Carson Notified of pt having copius amts of frothy sputum from mouth and inline trachael suctioning noted,resp rate elevated to high 30's. Stated to give something for sedation and have respiratory to adjust vent .Fentanyl and xanax given with relief noted.[facility] [employer]

## 2012-04-17 NOTE — Progress Notes (Signed)
PULMONARY  / CRITICAL CARE MEDICINE  Name: Johnny [name] MRN: [id number] DOB: Aug 12, 1934    LOS: 16  REFERRING MD :  TRH  CHIEF COMPLAINT:  Alcohol withdrawal/HCAP >> Cardiac arrest  BRIEF PATIENT DESCRIPTION:  77 yo male former smoker admitted 04/01/2012 with shakes and incontinent of urine/feces and found to have pneumonia in setting of ETOH abuse/withdrawal.  Developed septic shock, and PEA on 12/13 with VDRF and 20 min before ROSC.  PCCM assumed care from 12/13.   LINES / TUBES: 12/12 ETT >>12/20 12/20 Tstomy (JY) >> 12/12 L IJ CVL >> 12/13 Lt radial Aline>>12/14 12/21 PEG ([facility])>>>  CULTURES: 12/12 resp >>GPC in pairs and candida albicans. 12/12 blood >>NTD 12/15 >> coag neg staph  12/12 urine >>negative 12/12 influenza >negative 12/12 urine strep >> negative 12/12 urine leg >>negative 12/25 Sputum with multiple morphology 12/25 B cx x2>>>NTD  ANTIBIOTICS: 12/12 Vanc (HCAP) >>12/15>>>12/26>>>12/28 12/12 Cefepime (HCAP) >>12/15 12/12 Levaqin (HCAP) >>12/20 12/26 Zosyn>>>  SIGNIFICANT EVENTS:  12/12 admission and PEA arrest 12/14 Off pressors 12/20 Trach 12/24 A flutter, cardizem gtt started  TESTS: 12/12 CT head >> chronic microvascular ischemic changes 12/13 EEG >> moderate-to-severe generalized continuous slowing of cerebral activity 12/13 Echo >> EF 50 to 55% (tested while on dobutamine) 12/13 Abd u/s >> hepatic steatosis, medical renal disease, gallbladder sludge with small stones  LEVEL OF CARE:  ICU PRIMARY SERVICE:  PCCM CONSULTANTS:  None CODE STATUS: full  DIET:  Tube feeds DVT Px:  Lovenox GI Px:  Pantoprazole  INTERVAL HISTORY:  Low grade temp.  Remains on cardizem gtt.  VITAL SIGNS: Temp:  [98.4 F (36.9 C)-98.6 F (37 C)] 98.5 F (36.9 C) (12/28 0800) Pulse Rate:  [88-113] 103  (12/28 0839) Resp:  [16-24] 24  (12/28 0839) BP: (123-164)/(42-81) 144/42 mmHg (12/28 0839) SpO2:  [99 %-100 %] 100 % (12/28 0839) FiO2 (%):  [29.7  %-30.3 %] 30 % (12/28 0839) Weight:  [70.7 kg (155 lb 13.8 oz)] 70.7 kg (155 lb 13.8 oz) (12/28 0500) HEMODYNAMICS:   VENTILATOR SETTINGS: Vent Mode:  [-] PCV FiO2 (%):  [29.7 %-30.3 %] 30 % Set Rate:  [18 bmp] 18 bmp PEEP:  [5 cmH20] 5 cmH20 Pressure Support:  [10 cmH20] 10 cmH20 Plateau Pressure:  [17 cmH20-19 cmH20] 19 cmH20 INTAKE / OUTPUT: Intake/Output      12/27 0701 - 12/28 0700 12/28 0701 - 12/29 0700   I.V. (mL/kg)     Blood     Other 1320 120   NG/GT 1000 60   IV Piggyback 914    Total Intake(mL/kg) 3234 (45.7) 180 (2.5)   Urine (mL/kg/hr) 2575 (1.5)    Total Output 2575    Net +659 +180         PHYSICAL EXAMINATION: Gen: No distress, intermittent cough HEENT: Trach site clean PULM: Decreased breath sounds, no wheeze. CV: s1s2 regular, no murmur. AB: decreased bowel sounds, soft, non tender, PEG site clean Ext: no edema. Derm: no rashes Neuro:  Moves upper extremities  LABS: Cbc  Lab 04/17/12 0335 04/16/12 0510 04/15/12 1441  WBC 11.9* -- --  HGB 7.4* 7.4* 7.8*  HCT 22.3* 21.6* 23.2*  PLT 349 327 359   Chemistry  Lab 04/17/12 0335 04/16/12 0510 04/15/12 0418 04/14/12 0500  NA 142 145 150* --  K 3.5 3.0* 3.7 --  CL 105 110 114* --  CO2 25 22 25  --  BUN 28* 28* 35* --  CREATININE 1.29 1.22 1.25 --  CALCIUM  7.8* 8.1* 8.4 --  MG 1.4* 1.4* -- 1.9  PHOS 3.3 2.3 -- 3.2  GLUCOSE 210* 223* 303* --   Liver fxn No results found for this basename: AST:3,ALT:3,ALKPHOS:3,BILITOT:3,PROT:3,ALBUMIN:3 in the last 168 hours coags No results found for this basename: APTT:3,INR:3 in the last 168 hours Sepsis markers No results found for this basename: LATICACIDVEN:3,PROCALCITON:3 in the last 168 hours Cardiac markers No results found for this basename: CKTOTAL:3,CKMB:3,TROPONINI:3 in the last 168 hours BNP No results found for this basename: PROBNP:3 in the last 168 hours ABG No results found for this basename: PHART:3,PCO2ART:3,PO2ART:3,HCO3:3,TCO2:3 in the  last 168 hours CBG trend  Lab 04/17/12 0734 04/17/12 0352 04/17/12 0017 04/16/12 1953 04/16/12 1553  GLUCAP 181* 179* 173* 275* 243*    Intake/Output Summary (Last 24 hours) at 04/17/12 0913 Last data filed at 04/17/12 0900  Gross per 24 hour  Intake   3044 ml  Output   2575 ml  Net    469 ml   IMAGING:  No results found.  DIAGNOSES: Principal Problem:  *HCAP (healthcare-associated pneumonia) Active Problems:  DIABETES MELLITUS, TYPE II  HYPERTENSION  Alcohol dependence  ARF (acute renal failure)  Volume depletion  Hyponatremia  Acute respiratory failure with hypoxia  Cardiac arrest  Septic shock(785.52)  Encephalopathy  Tracheostomy status  Protein-calorie malnutrition, severe  Atrial flutter  ASSESSMENT / PLAN:  PULMONARY  ASSESSMENT: Acute respiratory failure in setting of PNA and cardiac arrest with hx of smoking. Suspect anxiety is the major component for respiratory failure at this time. Probable COPD. Failure to wean from vent s/pTstomy  PLAN:   Pressure support. Diureses as below. Changed to pressure control as rest mode 12/25 and seems to be tolerated best. F/u CXR intermittently. PRN combivent. See ID section for abx.  CARDIOVASCULAR  ASSESSMENT:  PEA. Likely related to pneumonia, acidosis, hypoxic respiratory failure in setting of ETOH withdrawal. Septic shock 2nd to PNA. Resolved 12/14. Hypervolemia. Improved. A flutter developed 12/24. PLAN:  Changed lopressor to 50 mg per tube q12h Stable with PO dilt. Once diuresed to dry weight will add low dose norvasc for BP control.  RENAL  ASSESSMENT:   Acute renal failure 2nd to shock. Baseline creatinine 0.95 from 01/05/12.  Lactic acidosis related to shock and liver dysfx. Resolved. Hypokalemia, hypomagnesemia, hypophosphatemia. Hypernatremia. 2nd to diuresis. PLAN:   Continue free water Lasix as ordered. Replace K and Mg. Monitor renal fx, urine outpt,  electrolytes  GASTROINTESTINAL  ASSESSMENT:   Protein calorie malnutrition with hx of ETOH. Fatty liver 2nd to ETOH. PLAN:   Continue tube feeds Continue Thiamine, folic acid, MVI.  HEMATOLOGIC  ASSESSMENT:   Anemia, thrombocytopenia likely related to ETOH and chronic disease with critical illness. PLAN:  F/u CBC. Transfuse for Hb < 7. Check anemia panel  INFECTIOUS  ASSESSMENT:   Pneumonia Fever 12/24 (Tm 102.5). Poor dentition. PLAN:   Repeat cultures as above Will need dental evaluation when more stable Restarted vanc and dose zosyn. Changed CVL to PICC line. WBC remains elevated, cultures are all negative.  Will D/C vanc.  ENDOCRINE  ASSESSMENT:   DM type II. PLAN:   SSI  NEUROLOGIC  ASSESSMENT:   EtOH withdrawal. Acute encephalopathy 2nd to sepsis, hypoxia, and cardiac arrest with ?anoxic injury.  PLAN:   Fentanyl PRN. Clonazepam for anxiety and withdrawal. Xanax for break through.  Awaiting select placement, will transfer to SDU vent bed.  Alyson Reedy, M.D. [facility] Pulmonary/Critical Care Medicine. Pager: [phone]. After hours pager: [phone].

## 2012-04-18 DIAGNOSIS — Z930 Tracheostomy status: Secondary | ICD-10-CM

## 2012-04-18 DIAGNOSIS — R197 Diarrhea, unspecified: Secondary | ICD-10-CM

## 2012-04-18 LAB — CBC
MCH: 31.7 pg (ref 26.0–34.0)
MCHC: 33.9 g/dL (ref 30.0–36.0)
MCV: 93.6 fL (ref 78.0–100.0)
Platelets: 321 10*3/uL (ref 150–400)
RDW: 16.4 % — ABNORMAL HIGH (ref 11.5–15.5)

## 2012-04-18 LAB — BASIC METABOLIC PANEL
Calcium: 8.1 mg/dL — ABNORMAL LOW (ref 8.4–10.5)
Creatinine, Ser: 1.49 mg/dL — ABNORMAL HIGH (ref 0.50–1.35)
GFR calc Af Amer: 51 mL/min — ABNORMAL LOW (ref >90)
GFR calc non Af Amer: 44 mL/min — ABNORMAL LOW (ref >90)
Sodium: 134 mEq/L — ABNORMAL LOW (ref 135–145)

## 2012-04-18 LAB — GLUCOSE, CAPILLARY
Glucose-Capillary: 164 mg/dL — ABNORMAL HIGH (ref 70–99)
Glucose-Capillary: 158 mg/dL — ABNORMAL HIGH (ref 70–99)

## 2012-04-18 LAB — MAGNESIUM: Magnesium: 2.1 mg/dL (ref 1.5–2.5)

## 2012-04-18 LAB — PHOSPHORUS: Phosphorus: 3.1 mg/dL (ref 2.3–4.6)

## 2012-04-18 NOTE — Progress Notes (Signed)
Per Supply Department, unable to send flexi-seal due to insurance issues. Today, we will try a rectal pouch instead.

## 2012-04-18 NOTE — Progress Notes (Signed)
Prior nurse reported that patient had several episodes of incontinence due to condom cath not staying on well. States she had just replaced it right prior to start of our shift 7pm.Johnny [name] [employer]

## 2012-04-18 NOTE — Progress Notes (Signed)
PULMONARY  / CRITICAL CARE MEDICINE  Name: Johnny [name] MRN: [id number] DOB: [birth year]    LOS: 17  REFERRING MD :  TRH  CHIEF COMPLAINT:  Alcohol withdrawal/HCAP >> Cardiac arrest  BRIEF PATIENT DESCRIPTION:  77 yo male former smoker admitted 04/01/2012 with shakes and incontinent of urine/feces and found to have pneumonia in setting of ETOH abuse/withdrawal.  Developed septic shock, and PEA on 12/13 with VDRF and 20 min before ROSC.  PCCM assumed care from 12/13.   LINES / TUBES: 12/12 ETT >>12/20 12/20 Tstomy (JY) >> 12/12 L IJ CVL >> 12/13 Lt radial Aline>>12/14 12/21 PEG ([facility])>>>  CULTURES: 12/12 resp >>GPC in pairs and candida albicans. 12/12 blood >>NTD 12/15 >> coag neg staph  12/12 urine >>negative 12/12 influenza >negative 12/12 urine strep >> negative 12/12 urine leg >>negative 12/25 Sputum with multiple morphology 12/25 B cx x2>>>NTD  ANTIBIOTICS: 12/12 Vanc (HCAP) >>12/15>>>12/26>>>12/28 12/12 Cefepime (HCAP) >>12/15 12/12 Levaqin (HCAP) >>12/20 12/26 Zosyn>>>  SIGNIFICANT EVENTS:  12/12 admission and PEA arrest 12/14 Off pressors 12/20 Trach 12/24 A flutter, cardizem gtt started  TESTS: 12/12 CT head >> chronic microvascular ischemic changes 12/13 EEG >> moderate-to-severe generalized continuous slowing of cerebral activity 12/13 Echo >> EF 50 to 55% (tested while on dobutamine) 12/13 Abd u/s >> hepatic steatosis, medical renal disease, gallbladder sludge with small stones  LEVEL OF CARE:  ICU PRIMARY SERVICE:  PCCM CONSULTANTS:  None CODE STATUS: full  DIET:  Tube feeds DVT Px:  Lovenox GI Px:  Pantoprazole  INTERVAL HISTORY:  Remains on cardizem gtt. Nurse Rx'd temp 102 w/ tylenol> 99, planning recheck. She also reports frequent loose stools and asks Flex-seal. Agitated last night but calmer today.  VITAL SIGNS: Temp:  [97.3 F (36.3 C)-102 F (38.9 C)] 99 F (37.2 C) (12/29 1000) Pulse Rate:  [73-106] 102  (12/29 0800) Resp:   [18-34] 20  (12/29 0800) BP: (122-146)/(41-58) 146/58 mmHg (12/29 0800) SpO2:  [85 %-100 %] 100 % (12/29 0800) FiO2 (%):  [30 %] 30 % (12/29 0800) Weight:  [71 kg (156 lb 8.4 oz)-75 kg (165 lb 5.5 oz)] 75 kg (165 lb 5.5 oz) (12/29 0500) HEMODYNAMICS:   VENTILATOR SETTINGS: Vent Mode:  [-] PCV FiO2 (%):  [30 %] 30 % Set Rate:  [18 bmp] 18 bmp PEEP:  [5 cmH20] 5 cmH20 Plateau Pressure:  [18 cmH20-20 cmH20] 18 cmH20 INTAKE / OUTPUT: Intake/Output      12/28 0701 - 12/29 0700 12/29 0701 - 12/30 0700   I.V. (mL/kg) 140 (1.9)    Other 1320 120   NG/GT 1310 250   IV Piggyback 112.5    Total Intake(mL/kg) 2882.5 (38.4) 370 (4.9)   Urine (mL/kg/hr) 2550 (1.4)    Stool 2    Total Output 2552    Net +330.5 +370        Urine Occurrence 1 x    Stool Occurrence 2 x     PHYSICAL EXAMINATION: Gen: No distress, sedated, passive w/ eyes open HEENT: Trach site clean PULM: Decreased breath sounds, no wheeze. CV: s1s2 regular, no murmur. AB: decreased bowel sounds, soft, non tender, PEG site clean. Condom cath Ext: no edema. Derm: no rashes Neuro:  As above  LABS: Cbc  Lab 04/18/12 0430 04/17/12 0335 04/16/12 0510  WBC 14.4* -- --  HGB 7.9* 7.4* 7.4*  HCT 23.3* 22.3* 21.6*  PLT 321 349 327   Chemistry  Lab 04/18/12 0430 04/17/12 0335 04/16/12 0510  NA 134* 142 145  K 3.7 3.5 3.0*  CL 99 105 110  CO2 24 25 22   BUN 32* 28* 28*  CREATININE 1.49* 1.29 1.22  CALCIUM 8.1* 7.8* 8.1*  MG 2.1 1.4* 1.4*  PHOS 3.1 3.3 2.3  GLUCOSE 204* 210* 223*   Liver fxn No results found for this basename: AST:3,ALT:3,ALKPHOS:3,BILITOT:3,PROT:3,ALBUMIN:3 in the last 168 hours coags No results found for this basename: APTT:3,INR:3 in the last 168 hours Sepsis markers No results found for this basename: LATICACIDVEN:3,PROCALCITON:3 in the last 168 hours Cardiac markers No results found for this basename: CKTOTAL:3,CKMB:3,TROPONINI:3 in the last 168 hours BNP No results found for this basename:  PROBNP:3 in the last 168 hours ABG No results found for this basename: PHART:3,PCO2ART:3,PO2ART:3,HCO3:3,TCO2:3 in the last 168 hours CBG trend  Lab 04/18/12 0454 04/18/12 0019 04/17/12 2034 04/17/12 1651 04/17/12 1219  GLUCAP 187* 150* 127* 223* 218*    Intake/Output Summary (Last 24 hours) at 04/18/12 1127 Last data filed at 04/18/12 0900  Gross per 24 hour  Intake 3072.5 ml  Output   2552 ml  Net  520.5 ml   IMAGING:  No results found.  DIAGNOSES: Principal Problem:  *HCAP (healthcare-associated pneumonia) Active Problems:  DIABETES MELLITUS, TYPE II  HYPERTENSION  Alcohol dependence  ARF (acute renal failure)  Volume depletion  Hyponatremia  Acute respiratory failure with hypoxia  Cardiac arrest  Septic shock(785.52)  Encephalopathy  Tracheostomy status  Protein-calorie malnutrition, severe  Atrial flutter  ASSESSMENT / PLAN:  PULMONARY  ASSESSMENT: Acute respiratory failure in setting of PNA and cardiac arrest with hx of smoking.  Probable COPD. Failure to wean from vent s/pTstomy  PLAN:   Pressure support. Diureses as below. Changed to pressure control as rest mode 12/25 and seems to be tolerated best. F/u CXR intermittently.> for recheck due to fever 12/29 PRN combivent. See ID section for abx.  CARDIOVASCULAR  ASSESSMENT:  PEA. Likely related to pneumonia, acidosis, hypoxic respiratory failure in setting of ETOH withdrawal. Septic shock 2nd to PNA. Resolved 12/14. Hypervolemia. Improved. A flutter developed 12/24. PLAN:  Changed lopressor to 50 mg per tube q12h Stable with PO dilt. Once diuresed to dry weight will add low dose norvasc for BP control.  RENAL  ASSESSMENT:   Acute renal failure 2nd to shock. Baseline creatinine 0.95 from 01/05/12.  Lactic acidosis related to shock and liver dysfx. Resolved. Hypokalemia, hypomagnesemia, hypophosphatemia. Hypernatremia. 2nd to diuresis. PLAN:   Continue free water Lasix as  ordered. Replace K and Mg. Monitor renal fx, urine outpt, electrolytes  GASTROINTESTINAL  ASSESSMENT:   Protein calorie malnutrition with hx of ETOH. Fatty liver 2nd to ETOH. PLAN:   Continue tube feeds Continue Thiamine, folic acid, MVI. Loose stools- check for C.diff 12/29  HEMATOLOGIC  ASSESSMENT:   Anemia, thrombocytopenia likely related to ETOH and chronic disease with critical illness. PLAN:  F/u CBC. Transfuse for Hb < 7. Check anemia panel  INFECTIOUS  ASSESSMENT:   Pneumonia Fever 12/24 (Tm 102.5). Poor dentition. PLAN:   Fever 102-  reculture New loose stools- assay C.diff Will need dental evaluation when more stable WBC remains elevated, cultures are all negative.  Will D/C vanc.  ENDOCRINE  ASSESSMENT:   DM type II. PLAN:   SSI  NEUROLOGIC  ASSESSMENT:   EtOH withdrawal. Acute encephalopathy 2nd to sepsis, hypoxia, and cardiac arrest with ?anoxic injury.  PLAN:   Fentanyl PRN. Clonazepam for anxiety and withdrawal. Xanax for break through.  Awaiting select placement, will transfer to SDU vent bed.  CD Maple Hudson, MD  PCCM p [phone] After hours pager: [phone].

## 2012-04-18 NOTE — Progress Notes (Addendum)
ANTIBIOTIC CONSULT NOTE - Follow-UP  Pharmacy Consult for Zosyn Indication: HCAP  No Known Allergies  Patient Measurements: Height: 5' 10.5" (179.1 cm) Weight: 165 lb 5.5 oz (75 kg) IBW/kg (Calculated) : 74.15    Vital Signs: Temp: 98.9 F (37.2 C) (12/29 1200) Temp src: Oral (12/29 1200) BP: 143/52 mmHg (12/29 1200) Pulse Rate: 88  (12/29 1300) Intake/Output from previous day: 12/28 0701 - 12/29 0700 In: 2882.5 [I.V.:140; NG/GT:1310; IV Piggyback:112.5] Out: 2552 [Urine:2550; Stool:2] Intake/Output from this shift: Total I/O In: 610 [Other:360; NG/GT:250] Out: -   Labs:  Basename 04/18/12 0430 04/17/12 0335 04/16/12 0510  WBC 14.4* 11.9* 13.9*  HGB 7.9* 7.4* 7.4*  PLT 321 349 327  LABCREA -- -- --  CREATININE 1.49* 1.29 1.22   Estimated Creatinine Clearance: 45 ml/min (by C-G formula based on Cr of 1.49). No results found for this basename: VANCOTROUGH:2,VANCOPEAK:2,VANCORANDOM:2,GENTTROUGH:2,GENTPEAK:2,GENTRANDOM:2,TOBRATROUGH:2,TOBRAPEAK:2,TOBRARND:2,AMIKACINPEAK:2,AMIKACINTROU:2,AMIKACIN:2, in the last 72 hours   Microbiology: Recent Results (from the past 720 hour(s))  URINE CULTURE     Status: Normal   Collection Time   04/01/12  2:40 PM      Component Value Range Status Comment   Specimen Description URINE, CATHETERIZED   Final    Special Requests NONE   Final    Culture  Setup Time 04/01/2012 15:34   Final    Colony Count NO GROWTH   Final    Culture NO GROWTH   Final    Report Status 04/02/2012 FINAL   Final   MRSA PCR SCREENING     Status: Normal   Collection Time   04/01/12 11:54 PM      Component Value Range Status Comment   MRSA by PCR NEGATIVE  NEGATIVE Final   CULTURE, BLOOD (ROUTINE X 2)     Status: Normal   Collection Time   04/02/12  1:18 AM      Component Value Range Status Comment   Specimen Description BLOOD CENTRAL LINE   Final    Special Requests BOTTLES DRAWN AEROBIC AND ANAEROBIC [facility] EACH   Final    Culture  Setup Time 04/02/2012  14:26   Final    Culture NO GROWTH 5 DAYS   Final    Report Status 04/08/2012 FINAL   Final   CULTURE, BLOOD (ROUTINE X 2)     Status: Normal   Collection Time   04/02/12  1:30 AM      Component Value Range Status Comment   Specimen Description BLOOD RIGHT HAND   Final    Special Requests BOTTLES DRAWN AEROBIC ONLY 1.5CC   Final    Culture  Setup Time 04/02/2012 14:26   Final    Culture NO GROWTH 5 DAYS   Final    Report Status 04/08/2012 FINAL   Final   URINE CULTURE     Status: Normal   Collection Time   04/02/12  2:04 AM      Component Value Range Status Comment   Specimen Description URINE, CATHETERIZED   Final    Special Requests Normal   Final    Culture  Setup Time 04/02/2012 08:25   Final    Colony Count NO GROWTH   Final    Culture NO GROWTH   Final    Report Status 04/03/2012 FINAL   Final   CULTURE, RESPIRATORY     Status: Normal   Collection Time   04/03/12 10:17 PM      Component Value Range Status Comment   Specimen Description TRACHEAL ASPIRATE  Final    Special Requests Normal   Final    Gram Stain     Final    Value: ABUNDANT WBC PRESENT, PREDOMINANTLY PMN     ABUNDANT YEAST     FEW GRAM POSITIVE COCCI IN PAIRS   Culture ABUNDANT CANDIDA ALBICANS   Final    Report Status 04/06/2012 FINAL   Final   CULTURE, BLOOD (ROUTINE X 2)     Status: Normal   Collection Time   04/04/12  4:23 PM      Component Value Range Status Comment   Specimen Description BLOOD RIGHT ARM   Final    Special Requests BOTTLES DRAWN AEROBIC AND ANAEROBIC 10CC   Final    Culture  Setup Time 04/04/2012 21:02   Final    Culture NO GROWTH 5 DAYS   Final    Report Status 04/10/2012 FINAL   Final   CULTURE, BLOOD (ROUTINE X 2)     Status: Normal   Collection Time   04/04/12  4:35 PM      Component Value Range Status Comment   Specimen Description BLOOD RIGHT HAND   Final    Special Requests BOTTLES DRAWN AEROBIC ONLY 2CC   Final    Culture  Setup Time 04/04/2012 21:02   Final     Culture     Final    Value: STAPHYLOCOCCUS SPECIES (COAGULASE NEGATIVE)     Note: THE SIGNIFICANCE OF ISOLATING THIS ORGANISM FROM A SINGLE SET OF BLOOD CULTURES WHEN MULTIPLE SETS ARE DRAWN IS UNCERTAIN. PLEASE NOTIFY THE MICROBIOLOGY DEPARTMENT WITHIN ONE WEEK IF SPECIATION AND SENSITIVITIES ARE REQUIRED.     Note: Gram Stain Report Called to,Read Back By and Verified With: SHANNA STOWE @0420AM  ON 04/06/12 BY MCLET   Report Status 04/07/2012 FINAL   Final   CULTURE, RESPIRATORY     Status: Normal   Collection Time   04/14/12 12:10 PM      Component Value Range Status Comment   Specimen Description TRACHEAL ASPIRATE   Final    Special Requests NONE   Final    Gram Stain     Final    Value: MODERATE WBC PRESENT,BOTH PMN AND MONONUCLEAR     RARE SQUAMOUS EPITHELIAL CELLS PRESENT     FEW GRAM POSITIVE RODS     FEW GRAM NEGATIVE RODS     RARE GRAM VARIABLE ROD   Culture     Final    Value: FEW CANDIDA ALBICANS     FEW DIPHTHEROIDS(CORYNEBACTERIUM SPECIES)     Note: Standardized susceptibility testing for this organism is not available.   Report Status 04/16/2012 FINAL   Final   CULTURE, BLOOD (ROUTINE X 2)     Status: Normal (Preliminary result)   Collection Time   04/14/12  1:00 PM      Component Value Range Status Comment   Specimen Description BLOOD HAND RIGHT   Final    Special Requests BOTTLES DRAWN AEROBIC ONLY 4.0CC   Final    Culture  Setup Time 04/14/2012 22:26   Final    Culture     Final    Value:        BLOOD CULTURE RECEIVED NO GROWTH TO DATE CULTURE WILL BE HELD FOR 5 DAYS BEFORE ISSUING A FINAL NEGATIVE REPORT   Report Status PENDING   Incomplete   CULTURE, BLOOD (ROUTINE X 2)     Status: Normal (Preliminary result)   Collection Time   04/14/12  1:20 PM  Component Value Range Status Comment   Specimen Description BLOOD HAND LEFT   Final    Special Requests BOTTLES DRAWN AEROBIC ONLY 6.0CC   Final    Culture  Setup Time 04/14/2012 22:26   Final    Culture      Final    Value:        BLOOD CULTURE RECEIVED NO GROWTH TO DATE CULTURE WILL BE HELD FOR 5 DAYS BEFORE ISSUING A FINAL NEGATIVE REPORT   Report Status PENDING   Incomplete   URINE CULTURE     Status: Normal (Preliminary result)   Collection Time   04/14/12  1:57 PM      Component Value Range Status Comment   Specimen Description URINE, RANDOM   Final    Special Requests NONE   Final    Culture  Setup Time 04/14/2012 15:33   Final    Colony Count 55,000 COLONIES/ML   Final    Culture GRAM NEGATIVE RODS   Final    Report Status PENDING   Incomplete     Medical History: Past Medical History  Diagnosis Date  . Acute alcoholic hepatitis 05/20/2010  . Anemia of other chronic disease 08/16/2007  . Atrial fibrillation 08/12/2007  . CORONARY ARTERY DISEASE 12/11/2006  . HYPERTENSION 12/11/2006  . Hyperlipidemia   . ETOH abuse   . Chronic low back pain   . Pneumonia     "now; never before" (04/01/2012)  . Hallucination 04/01/2012  . Exertional dyspnea   . DIABETES MELLITUS, TYPE II 12/11/2006  . Arthritis     "think that's what's in my back" (04/01/2012)  . Altered mental status 04/01/2012  . Seizures 04/01/2012    seizure-like activity @ home today/notes 04/01/2012     Medications:  Scheduled:     . antiseptic oral rinse  15 mL Mouth Rinse QID  . chlorhexidine  15 mL Mouth Rinse BID  . clonazePAM  1 mg Oral BID  . diltiazem  120 mg Oral Daily  . folic acid  1 mg Oral Daily  . free water  250 mL Per Tube Q4H  . [COMPLETED] furosemide  40 mg Intravenous Q8H  . heparin subcutaneous  5,000 Units Subcutaneous Q8H  . insulin aspart  0-15 Units Subcutaneous Q4H  . insulin aspart  4 Units Subcutaneous Q4H  . metoprolol tartrate  50 mg Per Tube BID  . multivitamin  5 mL Oral Daily  . pantoprazole sodium  40 mg Per Tube Q24H  . piperacillin-tazobactam (ZOSYN)  IV  3.375 g Intravenous Q8H  . [EXPIRED] potassium chloride  40 mEq Per Tube TID  . rocuronium  0.6 mg/kg Intravenous Once  .  sodium chloride  10-40 mL Intracatheter Q12H  . thiamine  100 mg Oral Daily   Assessment: 77yo male admitted 04/01/2012 found to have pneumonia in setting of ETOH abuse/withdrawal. Developed septic shock, and PEA on 12/13. Pharmacy consult to reinitiate  vancomycin and start zosyn. Vancomycin d/c'd.  Zosyn continues.  Noted resp. cx with few candida, diphtheroids.  UCx with only 55K GNR.  1/2 BCx with CNS.  Tm of 102 today, reculturing.  Scr 1.49; CrCl ~ 45 ml/min  Goal of Therapy:  Vancomycin trough level 15-20 mcg/ml  Plan:  1. Continue Zosyn at current dosage. 2. Monitor renal function and clinical progress 3. F/U new cultures.  Reece Leader, Pharm D 04/18/2012 2:58 PM

## 2012-04-19 LAB — FERRITIN: Ferritin: 1152 ng/mL — ABNORMAL HIGH (ref 22–322)

## 2012-04-19 LAB — IRON AND TIBC
Iron: <10 ug/dL — ABNORMAL LOW (ref 42–135)
UIBC: 92 ug/dL — ABNORMAL LOW (ref 125–400)

## 2012-04-19 LAB — TYPE AND SCREEN
ABO/RH(D): B POS
Unit division: 0

## 2012-04-19 LAB — RETICULOCYTES: Retic Ct Pct: 3 % (ref 0.4–3.1)

## 2012-04-19 LAB — GLUCOSE, CAPILLARY
Glucose-Capillary: 156 mg/dL — ABNORMAL HIGH (ref 70–99)
Glucose-Capillary: 237 mg/dL — ABNORMAL HIGH (ref 70–99)

## 2012-04-19 MED ORDER — CLONAZEPAM 0.5 MG PO TABS
0.5000 mg | ORAL_TABLET | Freq: Two times a day (BID) | ORAL | Status: DC
  Administered 2012-04-19 – 2012-04-23 (×8): 0.5 mg via ORAL
  Filled 2012-04-19 (×8): qty 1

## 2012-04-19 MED ORDER — INSULIN GLARGINE 100 UNIT/ML ~~LOC~~ SOLN
5.0000 [IU] | Freq: Every day | SUBCUTANEOUS | Status: DC
  Administered 2012-04-19: 22:00:00 via SUBCUTANEOUS
  Administered 2012-04-20 – 2012-04-26 (×7): 5 [IU] via SUBCUTANEOUS

## 2012-04-19 MED ORDER — FREE WATER
100.0000 mL | Freq: Three times a day (TID) | Status: DC
  Administered 2012-04-19 – 2012-04-20 (×3): 100 mL

## 2012-04-19 MED ORDER — FUROSEMIDE 10 MG/ML IJ SOLN
40.0000 mg | Freq: Two times a day (BID) | INTRAMUSCULAR | Status: AC
  Administered 2012-04-19 – 2012-04-21 (×4): 40 mg via INTRAVENOUS
  Filled 2012-04-19 (×4): qty 4

## 2012-04-19 NOTE — Clinical Social Work Note (Addendum)
Clinical Social Worker met with patient's daughters to discuss option of vent snf's. CSW explained to daughters that referrals are sent out both in [place] and [place] locations. CSW explained that [place] facilities tend to have more availability, then [place] facilities. Per daughter Arby Barrette, they are preferring [facility] if there is availability. Both daughters repeatedly expressed the desire to keep patient as local as possible if needing vent snf. Suprena Lilia Pro reported that she wants the patient to go to [facility] and CSW explained the CM will be able to expand on that discharge plan for LTAC. CSW provided daughters with list of vent snf's. Ms. Lilia Pro reported that she is concerned about activity on his bank account and the bank needing a letter stating that he is in the hospital for the daughter to file a claim. CSW provided daughter with letter. Patient will continue to be followed by [facility] CSW.   16:32pm Vent SNF referrals have been sent to [facility], [facility], [facility] and [facility].  Rozetta Nunnery MSW, [employer] [phone]

## 2012-04-19 NOTE — Progress Notes (Signed)
PULMONARY  / CRITICAL CARE MEDICINE  Name: Johnny [name] MRN: [id number] DOB: 11-27-1934    LOS: 18  REFERRING MD :  TRH  CHIEF COMPLAINT:  Alcohol withdrawal/HCAP >> Cardiac arrest  BRIEF PATIENT DESCRIPTION:  77 yo male former smoker admitted 04/01/2012 with shakes and incontinent of urine/feces and found to have pneumonia in setting of ETOH abuse/withdrawal.  Developed septic shock, and PEA on 12/13 with VDRF and 20 min before ROSC.  PCCM assumed care from 12/13.   LINES / TUBES: 12/12 ETT >>12/20 12/20 Tstomy (JY) >> 12/12 L IJ CVL >> 12/13 Lt radial Aline>>12/14 12/21 PEG ([facility])>>>  CULTURES: 12/12 resp >>GPC in pairs and candida albicans. 12/12 blood >>NTD 12/15 >> coag neg staph  12/12 urine >>negative 12/12 influenza >negative 12/12 urine strep >> negative 12/12 urine leg >>negative 12/25 urine>>>GNR>>> 12/25 Sputum >>> multiple morphology 12/25 B cx x2>>>NTD  ANTIBIOTICS: 12/12 Vanc (HCAP) >>12/15>>>12/26>>>12/28 12/12 Cefepime (HCAP) >>12/15 12/12 Levaqin (HCAP) >>12/20 12/26 Zosyn>>>  SIGNIFICANT EVENTS:  12/12 admission and PEA arrest 12/14 Off pressors 12/20 Trach 12/24 A flutter, cardizem gtt started  TESTS: 12/12 CT head >> chronic microvascular ischemic changes 12/13 EEG >> moderate-to-severe generalized continuous slowing of cerebral activity 12/13 Echo >> EF 50 to 55% (tested while on dobutamine) 12/13 Abd u/s >> hepatic steatosis, medical renal disease, gallbladder sludge with small stones  LEVEL OF CARE:  ICU PRIMARY SERVICE:  PCCM CONSULTANTS:  None CODE STATUS: full  DIET:  Tube feeds DVT Px:  SQ heparin  GI Px:  Pantoprazole  INTERVAL HISTORY:  Cardizem gtt off.  Diarrhea somewhat improved.   VITAL SIGNS: Temp:  [98 F (36.7 C)-101.1 F (38.4 C)] 98 F (36.7 C) (12/30 0900) Pulse Rate:  [84-99] 93  (12/30 0956) Resp:  [16-23] 18  (12/30 0956) BP: (113-155)/(40-70) 113/66 mmHg (12/30 0956) SpO2:  [98 %-100 %] 100 % (12/30  0956) FiO2 (%):  [30 %] 30 % (12/30 0900) Weight:  [167 lb 8.8 oz (76 kg)] 167 lb 8.8 oz (76 kg) (12/30 0035) HEMODYNAMICS:   VENTILATOR SETTINGS: Vent Mode:  [-] PCV FiO2 (%):  [30 %] 30 % Set Rate:  [18 bmp] 18 bmp PEEP:  [5 cmH20] 5 cmH20 INTAKE / OUTPUT: Intake/Output      12/29 0701 - 12/30 0700 12/30 0701 - 12/31 0700   I.V. (mL/kg) 140 (1.8)    Other 1380    NG/GT 1500    IV Piggyback 112.5    Total Intake(mL/kg) 3132.5 (41.2)    Urine (mL/kg/hr) 450 (0.2) 200   Stool     Total Output 450 200   Net +2682.5 -200        Urine Occurrence 1 x     PHYSICAL EXAMINATION: Gen: No distress, awake HEENT: Trach site clean, mm moist,  PULM: Resps even non labored on full support, Decreased breath sounds, no wheeze. CV: s1s2 regular, no murmur. AB: decreased bowel sounds, soft, non tender, PEG site clean.  Ext: no edema. Derm: no rashes Neuro:  Awake, follows some commands, significant weakness   LABS: CBC  Lab 04/18/12 0430 04/17/12 0335 04/16/12 0510  WBC 14.4* -- --  HGB 7.9* 7.4* 7.4*  HCT 23.3* 22.3* 21.6*  PLT 321 349 327   Chemistry  Lab 04/18/12 0430 04/17/12 0335 04/16/12 0510  NA 134* 142 145  K 3.7 3.5 3.0*  CL 99 105 110  CO2 24 25 22   BUN 32* 28* 28*  CREATININE 1.49* 1.29 1.22  CALCIUM  8.1* 7.8* 8.1*  MG 2.1 1.4* 1.4*  PHOS 3.1 3.3 2.3  GLUCOSE 204* 210* 223*   Liver fxn No results found for this basename: AST:3,ALT:3,ALKPHOS:3,BILITOT:3,PROT:3,ALBUMIN:3 in the last 168 hours coags No results found for this basename: APTT:3,INR:3 in the last 168 hours Sepsis markers No results found for this basename: LATICACIDVEN:3,PROCALCITON:3 in the last 168 hours Cardiac markers No results found for this basename: CKTOTAL:3,CKMB:3,TROPONINI:3 in the last 168 hours BNP No results found for this basename: PROBNP:3 in the last 168 hours ABG No results found for this basename: PHART:3,PCO2ART:3,PO2ART:3,HCO3:3,TCO2:3 in the last 168 hours CBG trend  Lab  04/19/12 0852 04/19/12 0417 04/19/12 0033 04/18/12 2010 04/18/12 1614  GLUCAP 156* 174* 164* 118* 158*    Intake/Output Summary (Last 24 hours) at 04/19/12 1009 Last data filed at 04/19/12 0900  Gross per 24 hour  Intake 2702.5 ml  Output    650 ml  Net 2052.5 ml   IMAGING:  No results found.  DIAGNOSES: Principal Problem:  *HCAP (healthcare-associated pneumonia) Active Problems:  DIABETES MELLITUS, TYPE II  HYPERTENSION  Alcohol dependence  ARF (acute renal failure)  Volume depletion  Hyponatremia  Acute respiratory failure with hypoxia  Cardiac arrest  Septic shock(785.52)  Encephalopathy  Tracheostomy status  Protein-calorie malnutrition, severe  Atrial flutter  Diarrhea  ASSESSMENT / PLAN:  PULMONARY  ASSESSMENT: Acute respiratory failure in setting of PNA and cardiac arrest with hx of smoking.  Probable COPD. Failure to wean from vent s/pTstomy  PLAN:   Pressure support weaning attempts as tol  Diureses as below. pressure control rest settings, now with trach likely can use prvc F/u CXR intermittently in am , does not explain poor weaning, neur likely a big contribution, PS 15 attempt today Cont PRN combivent. See ID section for abx. If continues to fail weaning, will CT spiral consideration  CARDIOVASCULAR  ASSESSMENT:  PEA-Likely related to pneumonia, acidosis, hypoxic respiratory failure in setting of ETOH withdrawal. Septic shock 2nd to PNA- Resolved 12/14. Hypervolemia -Improved. A flutter developed 12/24.  PLAN:  Cont lopressor 50 mg per tube q12h Stable with PO dilt. Once diuresed to dry weight will add low dose norvasc for BP control - remains net POS 12/30  RENAL  ASSESSMENT:   Acute renal failure 2nd to shock-Baseline creatinine 0.95 from 01/05/12.  Lactic acidosis related to shock and liver dysfx.-Resolved. Hypokalemia, hypomagnesemia, hypophosphatemia. Hypernatremia.-2nd to diuresis. RESOLVED PLAN:   free water - with ongoing  diuresis, dc if Na less 140 Repeat lasix - watch with bump in Scr but remains 2L PO Monitor renal fx in am   GASTROINTESTINAL  ASSESSMENT:   Protein calorie malnutrition with hx of ETOH. Fatty liver 2nd to ETOH. PLAN:   Continue tube feeds Continue Thiamine, folic acid, MVI. Loose stools- CDIff PCR pending   HEMATOLOGIC  ASSESSMENT:   Anemia, thrombocytopenia likely related to ETOH and chronic disease with critical illness. PLAN:  F/u CBC. Transfuse for Hb < 7. Check anemia panel  INFECTIOUS  ASSESSMENT:   Pneumonia Fever 12/24 (Tm 102.5) - resolved  Poor dentition. PLAN:   Repeat cultures 12/25 remain neg F/u CDIff PCR Will need dental evaluation when more stable WBC remains elevated, cultures are all negative.  Cont Zosyn as above  radiographic resolving pna, consider add stop date abx 31st  ENDOCRINE  ASSESSMENT:   DM type II. PLAN:   SSI Add lantus 12/30  NEUROLOGIC  ASSESSMENT:   EtOH withdrawal. Acute encephalopathy 2nd to sepsis, hypoxia, and cardiac arrest with ?anoxic  injury.  PLAN:   Fentanyl PRN. Cont scheduled clonazepam for ?withdrawal - taper as able, decrease to 0.5mg  BID 12/30 Cont PRN ativan/xanax   Awaiting [facility] placement   WHITEHEART,KATHRYN, NP 04/19/2012  10:13 AM Pager: [phone] or [phone]  *Care during the described time interval was provided by me and/or other providers on the critical care team. I have reviewed this patient's available data, including medical history, events of note, physical examination and test results as part of my evaluation.  Mcarthur Rossetti. Tyson Alias, MD, FACP Pgr: [phone] [facility]

## 2012-04-20 LAB — CULTURE, BLOOD (ROUTINE X 2)

## 2012-04-20 LAB — CBC
HCT: 24.2 % — ABNORMAL LOW (ref 39.0–52.0)
Hemoglobin: 8.2 g/dL — ABNORMAL LOW (ref 13.0–17.0)
MCHC: 33.9 g/dL (ref 30.0–36.0)
MCV: 92 fL (ref 78.0–100.0)
RDW: 15.4 % (ref 11.5–15.5)
WBC: 14.1 10*3/uL — ABNORMAL HIGH (ref 4.0–10.5)

## 2012-04-20 LAB — URINE CULTURE: Colony Count: 55000

## 2012-04-20 LAB — GLUCOSE, CAPILLARY
Glucose-Capillary: 78 mg/dL (ref 70–99)
Glucose-Capillary: 325 mg/dL — ABNORMAL HIGH (ref 70–99)
Glucose-Capillary: 118 mg/dL — ABNORMAL HIGH (ref 70–99)

## 2012-04-20 LAB — BASIC METABOLIC PANEL
BUN: 29 mg/dL — ABNORMAL HIGH (ref 6–23)
CO2: 21 mEq/L (ref 19–32)
Chloride: 98 mEq/L (ref 96–112)
Creatinine, Ser: 1.38 mg/dL — ABNORMAL HIGH (ref 0.50–1.35)
Potassium: 3.2 mEq/L — ABNORMAL LOW (ref 3.5–5.1)

## 2012-04-20 LAB — CULTURE, RESPIRATORY W GRAM STAIN

## 2012-04-20 MED ORDER — JEVITY 1.2 CAL PO LIQD
1000.0000 mL | ORAL | Status: DC
  Administered 2012-04-20 – 2012-04-28 (×5): 1000 mL
  Filled 2012-04-20 (×14): qty 1000

## 2012-04-20 MED ORDER — POTASSIUM CHLORIDE 20 MEQ/15ML (10%) PO LIQD
40.0000 meq | Freq: Once | ORAL | Status: AC
  Administered 2012-04-20: 40 meq

## 2012-04-20 MED ORDER — PRO-STAT SUGAR FREE PO LIQD
30.0000 mL | Freq: Three times a day (TID) | ORAL | Status: DC
  Administered 2012-04-20 – 2012-05-07 (×51): 30 mL
  Filled 2012-04-20 (×55): qty 30

## 2012-04-20 NOTE — Progress Notes (Addendum)
NUTRITION FOLLOW UP  Intervention:   1. To better meet nutrition needs, recommend new goal rate of Jevity 1.2 at 55 ml/hr with 30 ml Prostat via tube TID. This regimen will provide: 1884 kcal, 118 grams protein, and 1065 ml H2O.  2. RD will continue to follow   Nutrition Dx:   Inadequate oral intake related to inability to eat as evidenced by NPO. Ongoing.  Goal:   EN to meet >/=90% estimated nutrition needs. Unmet  Monitor:   Vent status, Trach/PEG, weight trends, I/O's  Assessment:   Trach placed 12/20. PEG 12/24. RN reports pt is tolerating regimen well and has not had any diarrhea yet today (pt noted to have diarrhea recently, rectal pouch in place.)   Currently receiving Jevity 1.2 at 60 ml/hr. This provides 1728 kcal, 80 grams protein, 1162 ml free water. Pt currently ordered for 100 ml water flushes TID, provides an additional 300 ml water daily.  Residuals: n/a   Last bm: 12/29 (diarrhea) Pt continues with rectal pouch for diarrhea.   Patient is currently intubated on ventilator support.  MV: 6.8 Temp:Temp (24hrs), Avg:98.9 F (37.2 C), Min:98.2 F (36.8 C), Max:100.4 F (38 C)   Height: Ht Readings from Last 1 Encounters:  04/01/12 5' 10.5" (1.791 m)    Weight Status:   Wt Readings from Last 1 Encounters:  04/20/12 161 lb 13.1 oz (73.4 kg)  Stabilizing  Re-estimated needs:  Kcal: 1769 Protein: 115-130 gm  Fluid: per team  Skin: skin tear to sacrum  Diet Order: NPO   Intake/Output Summary (Last 24 hours) at 04/20/12 1055 Last data filed at 04/20/12 0900  Gross per 24 hour  Intake   2135 ml  Output   3403 ml  Net  -1268 ml   Labs:   Lab 04/20/12 0500 04/18/12 0430 04/17/12 0335 04/16/12 0510  NA 132* 134* 142 --  K 3.2* 3.7 3.5 --  CL 98 99 105 --  CO2 21 24 25  --  BUN 29* 32* 28* --  CREATININE 1.38* 1.49* 1.29 --  CALCIUM 8.4 8.1* 7.8* --  MG -- 2.1 1.4* 1.4*  PHOS -- 3.1 3.3 2.3  GLUCOSE 241* 204* 210* --   Sodium  Date/Time Value  Range Status  04/20/2012  5:00 AM 132* 135 - 145 mEq/L Final  04/18/2012  4:30 AM 134* 135 - 145 mEq/L Final  04/17/2012  3:35 AM 142  135 - 145 mEq/L Final    Potassium  Date/Time Value Range Status  04/20/2012  5:00 AM 3.2* 3.5 - 5.1 mEq/L Final  04/18/2012  4:30 AM 3.7  3.5 - 5.1 mEq/L Final  04/17/2012  3:35 AM 3.5  3.5 - 5.1 mEq/L Final    Phosphorus  Date/Time Value Range Status  04/18/2012  4:30 AM 3.1  2.3 - 4.6 mg/dL Final  78/29/5621  3:08 AM 3.3  2.3 - 4.6 mg/dL Final  65/78/4696  2:95 AM 2.3  2.3 - 4.6 mg/dL Final    Magnesium  Date/Time Value Range Status  04/18/2012  4:30 AM 2.1  1.5 - 2.5 mg/dL Final  28/41/3244  0:10 AM 1.4* 1.5 - 2.5 mg/dL Final  27/25/3664  4:03 AM 1.4* 1.5 - 2.5 mg/dL Final     CBG (last 3)   Basename 04/20/12 0814 04/20/12 0351 04/19/12 2329  GLUCAP 154* 166* 78    Scheduled Meds:    . antiseptic oral rinse  15 mL Mouth Rinse QID  . chlorhexidine  15 mL Mouth Rinse BID  .  clonazePAM  0.5 mg Oral BID  . diltiazem  120 mg Oral Daily  . folic acid  1 mg Oral Daily  . free water  100 mL Per Tube Q8H  . furosemide  40 mg Intravenous Q12H  . heparin subcutaneous  5,000 Units Subcutaneous Q8H  . insulin aspart  0-15 Units Subcutaneous Q4H  . insulin aspart  4 Units Subcutaneous Q4H  . insulin glargine  5 Units Subcutaneous QHS  . metoprolol tartrate  50 mg Per Tube BID  . multivitamin  5 mL Oral Daily  . pantoprazole sodium  40 mg Per Tube Q24H  . piperacillin-tazobactam (ZOSYN)  IV  3.375 g Intravenous Q8H  . rocuronium  0.6 mg/kg Intravenous Once  . sodium chloride  10-40 mL Intracatheter Q12H  . thiamine  100 mg Oral Daily    Continuous Infusions:    . sodium chloride 20 mL/hr at 04/16/12 0700  . dextrose 40 mL/hr at 04/16/12 0700  . feeding supplement (JEVITY 1.2 CAL) 60 mL/hr at 04/19/12 2332   Jarold Motto MS, RD, LDN Pager: [phone] After-hours pager: [phone]

## 2012-04-20 NOTE — Progress Notes (Signed)
PULMONARY  / CRITICAL CARE MEDICINE  Name: Johnny [name] MRN: [id number] DOB: August 16, 1934    LOS: 19  REFERRING MD :  TRH  CHIEF COMPLAINT:  Alcohol withdrawal/HCAP >> Cardiac arrest  BRIEF PATIENT DESCRIPTION:  77 yo male former smoker admitted 04/01/2012 with shakes and incontinent of urine/feces and found to have pneumonia in setting of ETOH abuse/withdrawal.  Developed septic shock, and PEA on 12/13 with VDRF and 20 min before ROSC.  PCCM assumed care from 12/13.   LINES / TUBES: 12/12 ETT >>12/20 12/20 Tstomy (JY) >> 12/12 L IJ CVL >> 12/13 Lt radial Aline>>12/14 12/21 PEG ([facility])>>>  CULTURES: 12/12 resp >>GPC in pairs and candida albicans. 12/12 blood >>NTD 12/15 >> coag neg staph  12/12 urine >>negative 12/12 influenza >negative 12/12 urine strep >> negative 12/12 urine leg >>negative 12/25 urine>>>GNR>>>psuedomonas ss zoysn 12/25 Sputum >>> multiple morphology 12/25 B cx x2>>>NTD  ANTIBIOTICS: 12/12 Vanc (HCAP) >>12/15>>>12/26>>>12/28 12/12 Cefepime (HCAP) >>12/15 12/12 Levaqin (HCAP) >>12/20 12/26 Zosyn>>>  SIGNIFICANT EVENTS:  12/12 admission and PEA arrest 12/14 Off pressors 12/20 Trach 12/24 A flutter, cardizem gtt started  TESTS: 12/12 CT head >> chronic microvascular ischemic changes 12/13 EEG >> moderate-to-severe generalized continuous slowing of cerebral activity 12/13 Echo >> EF 50 to 55% (tested while on dobutamine) 12/13 Abd u/s >> hepatic steatosis, medical renal disease, gallbladder sludge with small stones  LEVEL OF CARE:  ICU PRIMARY SERVICE:  PCCM CONSULTANTS:  None CODE STATUS: full  DIET:  Tube feeds DVT Px:  SQ heparin  GI Px:  Pantoprazole  INTERVAL HISTORY:  Weaning on vent  VITAL SIGNS: Temp:  [98.3 F (36.8 C)-100.4 F (38 C)] 98.5 F (36.9 C) (12/31 1135) Pulse Rate:  [92-201] 103  (12/31 1135) Resp:  [11-34] 34  (12/31 1135) BP: (126-188)/(52-86) 126/52 mmHg (12/31 1135) SpO2:  [98 %-100 %] 100 % (12/31  1135) FiO2 (%):  [30 %] 30 % (12/31 1135) Weight:  [73.4 kg (161 lb 13.1 oz)] 73.4 kg (161 lb 13.1 oz) (12/31 0500) HEMODYNAMICS:   VENTILATOR SETTINGS: Vent Mode:  [-] CPAP;PSV FiO2 (%):  [30 %] 30 % Set Rate:  [18 bmp] 18 bmp PEEP:  [5 cmH20] 5 cmH20 Pressure Support:  [12 cmH20] 12 cmH20 Plateau Pressure:  [19 cmH20-21 cmH20] 20 cmH20 INTAKE / OUTPUT: Intake/Output      12/30 0701 - 12/31 0700 12/31 0701 - 01/01 0700   I.V. (mL/kg) 460 (6.3) 50 (0.7)   Other 1380 120   NG/GT 200 25   IV Piggyback 150    Total Intake(mL/kg) 2190 (29.8) 195 (2.7)   Urine (mL/kg/hr) 3300 (1.9) 900 (2.1)   Stool 3 1   Total Output 3303 901   Net -1113 -706         PHYSICAL EXAMINATION: Gen: No distress, awake, on ps /cpap HEENT: Trach site clean, mm moist,  PULM: Resps even non labored on full support, Decreased breath sounds, no wheeze. CV: s1s2 regular, no murmur. AB: decreased bowel sounds, soft, non tender, PEG site clean.  Ext: no edema. Derm: no rashes Neuro:  Awake, follows some commands, significant weakness . Tries to interact  LABS: CBC  Lab 04/20/12 0500 04/18/12 0430 04/17/12 0335  WBC 14.1* -- --  HGB 8.2* 7.9* 7.4*  HCT 24.2* 23.3* 22.3*  PLT 384 321 349   Chemistry  Lab 04/20/12 0500 04/18/12 0430 04/17/12 0335 04/16/12 0510  NA 132* 134* 142 --  K 3.2* 3.7 3.5 --  CL 98 99  105 --  CO2 21 24 25  --  BUN 29* 32* 28* --  CREATININE 1.38* 1.49* 1.29 --  CALCIUM 8.4 8.1* 7.8* --  MG -- 2.1 1.4* 1.4*  PHOS -- 3.1 3.3 2.3  GLUCOSE 241* 204* 210* --   Liver fxn No results found for this basename: AST:3,ALT:3,ALKPHOS:3,BILITOT:3,PROT:3,ALBUMIN:3 in the last 168 hours coags No results found for this basename: APTT:3,INR:3 in the last 168 hours Sepsis markers No results found for this basename: LATICACIDVEN:3,PROCALCITON:3 in the last 168 hours Cardiac markers No results found for this basename: CKTOTAL:3,CKMB:3,TROPONINI:3 in the last 168 hours BNP No results  found for this basename: PROBNP:3 in the last 168 hours ABG No results found for this basename: PHART:3,PCO2ART:3,PO2ART:3,HCO3:3,TCO2:3 in the last 168 hours CBG trend  Lab 04/20/12 1222 04/20/12 0814 04/20/12 0351 04/19/12 2329 04/19/12 1932  GLUCAP 232* 154* 166* 78 237*    Intake/Output Summary (Last 24 hours) at 04/20/12 1249 Last data filed at 04/20/12 1200  Gross per 24 hour  Intake   1985 ml  Output   4004 ml  Net  -2019 ml   IMAGING:  No results found.  DIAGNOSES: Principal Problem:  *HCAP (healthcare-associated pneumonia) Active Problems:  DIABETES MELLITUS, TYPE II  HYPERTENSION  Alcohol dependence  ARF (acute renal failure)  Volume depletion  Hyponatremia  Acute respiratory failure with hypoxia  Cardiac arrest  Septic shock(785.52)  Encephalopathy  Tracheostomy status  Protein-calorie malnutrition, severe  Atrial flutter  Diarrhea  ASSESSMENT / PLAN:  PULMONARY  ASSESSMENT: Acute respiratory failure in setting of PNA and cardiac arrest with hx of smoking.  Probable COPD. Failure to wean from vent s/pTstomy  PLAN:   Pressure support weaning attempts as tol , did 12 with success, elevated, but it is a start, goal to ps 10 in am nocturnal rest mandatory Diureses as below. pressure control rest F/u CXR intermittently Cont PRN combivent. See ID section for abx. Low threshold spiral CT chest (pcxr does not explain poor weaning), although has made some improvement on weaning last 24 hrs  CARDIOVASCULAR  ASSESSMENT:  PEA-Likely related to pneumonia, acidosis, hypoxic respiratory failure in setting of ETOH withdrawal. Septic shock 2nd to PNA- Resolved 12/14. Hypervolemia -Improved. A flutter developed 12/24.  PLAN:  Cont lopressor 50 mg per tube q12h Stable with PO dilt. Once diuresed to dry weight will add low dose norvasc for BP control - remains net POS 12/30  RENAL Lab Results  Component Value Date   CREATININE 1.38* 04/20/2012    CREATININE 1.49* 04/18/2012   CREATININE 1.29 04/17/2012     ASSESSMENT:   Acute renal failure 2nd to shock-Baseline creatinine 0.95 from 01/05/12.  Lactic acidosis related to shock and liver dysfx.-Resolved. Hypokalemia, hypomagnesemia, hypophosphatemia. Hypernatremia.-2nd to diuresis. RESOLVED PLAN:   free water - with ongoing diuresis, dc if Na less 140, 132 on 12-31 free h20 dc'd Repeat lasix - watch bun/crret Monitor renal fx in am   GASTROINTESTINAL  ASSESSMENT:   Protein calorie malnutrition with hx of ETOH. Fatty liver 2nd to ETOH. PLAN:   Continue tube feeds Continue Thiamine, folic acid, MVI. Loose stools- CDIff PCR pending   HEMATOLOGIC  ASSESSMENT:   Anemia, thrombocytopenia likely related to ETOH and chronic disease with critical illness. PLAN:  F/u CBC. Transfuse for Hb < 7. Check anemia panel  INFECTIOUS  ASSESSMENT:   Pneumonia Fever 12/24 (Tm 102.5) - resolved  Poor dentition. UTI with pa. PLAN:   Repeat cultures 12/25 remain neg F/u CDIff PCR Will need dental evaluation  when more stable WBC remains elevated, cultures are all negative.  Cont Zosyn as above  radiographic resolving pna resolved, consider add stop date abx 31st currently day 5/x this covers UTI  ENDOCRINE  ASSESSMENT:   DM type II. PLAN:   SSI Add lantus 12/30  NEUROLOGIC  ASSESSMENT:   EtOH withdrawal. Acute encephalopathy 2nd to sepsis, hypoxia, and cardiac arrest with ?anoxic injury.  PLAN:   Fentanyl PRN. Cont scheduled clonazepam for ?withdrawal - taper as able, decrease to 0.5mg  BID 12/30 Cont PRN ativan/xanax   Awaiting [facility] placement  [facility] Minor ACNP Adolph Pollack PCCM Pager [phone] till 3 pm If no answer page [phone] 04/20/2012, 12:50 PM   I have fully examined this patient and agree with above findings.    And edited in full  Sigismund Cross. Tyson Alias, MD, FACP Pgr: [phone] [facility]

## 2012-04-20 NOTE — Progress Notes (Signed)
Trach Team Patient Details Name: Johnny [name] MRN: [id number] DOB: [birth year] Today's Date: 04/20/2012 Time:  -    SLP reviewed chart as part of trach team.  He unable to wean off vent.  SLP will continue to follow.  Breck Coons [place].Minda Meo Pager [phone]  04/20/2012'

## 2012-04-21 ENCOUNTER — Inpatient Hospital Stay (HOSPITAL_COMMUNITY): Payer: Medicare PPO

## 2012-04-21 LAB — GLUCOSE, CAPILLARY
Glucose-Capillary: 136 mg/dL — ABNORMAL HIGH (ref 70–99)
Glucose-Capillary: 141 mg/dL — ABNORMAL HIGH (ref 70–99)
Glucose-Capillary: 204 mg/dL — ABNORMAL HIGH (ref 70–99)

## 2012-04-21 LAB — CBC
HCT: 24.4 % — ABNORMAL LOW (ref 39.0–52.0)
Hemoglobin: 8.1 g/dL — ABNORMAL LOW (ref 13.0–17.0)
MCHC: 33.2 g/dL (ref 30.0–36.0)
RBC: 2.6 MIL/uL — ABNORMAL LOW (ref 4.22–5.81)

## 2012-04-21 LAB — BASIC METABOLIC PANEL
BUN: 46 mg/dL — ABNORMAL HIGH (ref 6–23)
CO2: 23 mEq/L (ref 19–32)
Chloride: 98 mEq/L (ref 96–112)
Glucose, Bld: 196 mg/dL — ABNORMAL HIGH (ref 70–99)
Potassium: 3.5 mEq/L (ref 3.5–5.1)
Sodium: 137 mEq/L (ref 135–145)

## 2012-04-21 LAB — MAGNESIUM: Magnesium: 1.8 mg/dL (ref 1.5–2.5)
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
## 2012-04-22 LAB — GLUCOSE, CAPILLARY
Glucose-Capillary: 155 mg/dL — ABNORMAL HIGH (ref 70–99)
Glucose-Capillary: 134 mg/dL — ABNORMAL HIGH (ref 70–99)
Glucose-Capillary: 179 mg/dL — ABNORMAL HIGH (ref 70–99)
Glucose-Capillary: 160 mg/dL — ABNORMAL HIGH (ref 70–99)
Glucose-Capillary: 228 mg/dL — ABNORMAL HIGH (ref 70–99)

## 2012-04-22 MED ORDER — FUROSEMIDE 10 MG/ML IJ SOLN
40.0000 mg | Freq: Two times a day (BID) | INTRAMUSCULAR | Status: AC
  Administered 2012-04-22 – 2012-04-23 (×2): 40 mg via INTRAVENOUS
  Filled 2012-04-22 (×2): qty 4

## 2012-04-22 MED ORDER — POTASSIUM CHLORIDE 20 MEQ/15ML (10%) PO LIQD
40.0000 meq | Freq: Two times a day (BID) | ORAL | Status: AC
  Administered 2012-04-23 (×2): 40 meq
  Filled 2012-04-22 (×2): qty 30
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
## 2012-04-23 LAB — CBC
Platelets: 426 10*3/uL — ABNORMAL HIGH (ref 150–400)
RDW: 15.3 % (ref 11.5–15.5)
WBC: 16.4 10*3/uL — ABNORMAL HIGH (ref 4.0–10.5)

## 2012-04-23 LAB — BASIC METABOLIC PANEL
Chloride: 105 mEq/L (ref 96–112)
GFR calc Af Amer: 63 mL/min — ABNORMAL LOW (ref >90)
GFR calc non Af Amer: 55 mL/min — ABNORMAL LOW (ref >90)
Potassium: 3.5 mEq/L (ref 3.5–5.1)
Sodium: 142 mEq/L (ref 135–145)

## 2012-04-23 LAB — GLUCOSE, CAPILLARY
Glucose-Capillary: 191 mg/dL — ABNORMAL HIGH (ref 70–99)
Glucose-Capillary: 166 mg/dL — ABNORMAL HIGH (ref 70–99)
Glucose-Capillary: 148 mg/dL — ABNORMAL HIGH (ref 70–99)
Glucose-Capillary: 166 mg/dL — ABNORMAL HIGH (ref 70–99)
Glucose-Capillary: 209 mg/dL — ABNORMAL HIGH (ref 70–99)

## 2012-04-23 MED ORDER — CLONAZEPAM 0.5 MG PO TABS
0.5000 mg | ORAL_TABLET | Freq: Every day | ORAL | Status: DC
  Administered 2012-04-25 – 2012-04-26 (×3): 0.5 mg via ORAL
  Filled 2012-04-23 (×3): qty 1

## 2012-04-23 NOTE — Progress Notes (Addendum)
NUTRITION FOLLOW UP  Intervention:   1. Continue current regimen of Jevity 1.2 at 55 ml/hr with 30 ml Prostat via tube TID. Currently meeting 99% kcal needs and 100% protein needs. 2. RD will continue to follow   Nutrition Dx:   Inadequate oral intake related to inability to eat as evidenced by NPO. Ongoing.  Goal:   EN to meet >/=90% estimated nutrition needs. Unmet  Monitor:   Vent status, weight trends, I/O's, TF tolerance/adequacy  Assessment:   Patient is currently on ventilator support via trach.  MV: 14.8 Temp:Temp (24hrs), Avg:99.1 F (37.3 C), Min:98.3 F (36.8 C), Max:100.6 F (38.1 C)  Trach placed 12/20. PEG 12/24. RN reports pt is tolerating regimen well. Pt with some improvement in weaning in last 24-48 hours per chart.  Currently receiving Jevity 1.2 at 55 ml/hr. 30 ml Prostat via tube TID. This provides 1884 kcal, 118 grams protein, 1065 ml free water. No free water flushes.  Residuals: n/a   Last bm: 1/3 (diarrhea) Pt continues with rectal pouch for diarrhea.    Height: Ht Readings from Last 1 Encounters:  04/01/12 5' 10.5" (1.791 m)    Weight Status:   Wt Readings from Last 1 Encounters:  04/21/12 161 lb 9.6 oz (73.3 kg)  Stabilizing  Re-estimated needs:  Kcal: 1904 Protein: 115-130 gm  Fluid: per team  Skin: stage III at trach site; skin tear to sacrum  Diet Order: NPO   Intake/Output Summary (Last 24 hours) at 04/23/12 0802 Last data filed at 04/23/12 0600  Gross per 24 hour  Intake   1560 ml  Output   1750 ml  Net   -190 ml   Labs:   Lab 04/23/12 0555 04/21/12 0500 04/20/12 0500 04/18/12 0430 04/17/12 0335  NA 142 137 132* -- --  K 3.5 3.5 3.2* -- --  CL 105 98 98 -- --  CO2 24 23 21  -- --  BUN 57* 46* 29* -- --  CREATININE 1.25 1.59* 1.38* -- --  CALCIUM 9.1 8.7 8.4 -- --  MG -- 1.8 -- 2.1 1.4*  PHOS -- 3.6 -- 3.1 3.3  GLUCOSE 173* 196* 241* -- --   Sodium  Date/Time Value Range Status  04/23/2012  5:55 AM 142  135 - 145  mEq/L Final  04/21/2012  5:00 AM 137  135 - 145 mEq/L Final  04/20/2012  5:00 AM 132* 135 - 145 mEq/L Final    Potassium  Date/Time Value Range Status  04/23/2012  5:55 AM 3.5  3.5 - 5.1 mEq/L Final  04/21/2012  5:00 AM 3.5  3.5 - 5.1 mEq/L Final  04/20/2012  5:00 AM 3.2* 3.5 - 5.1 mEq/L Final    Phosphorus  Date/Time Value Range Status  04/21/2012  5:00 AM 3.6  2.3 - 4.6 mg/dL Final  09/81/1914  7:82 AM 3.1  2.3 - 4.6 mg/dL Final  95/62/1308  6:57 AM 3.3  2.3 - 4.6 mg/dL Final    Magnesium  Date/Time Value Range Status  04/21/2012  5:00 AM 1.8  1.5 - 2.5 mg/dL Final  84/69/6295  2:84 AM 2.1  1.5 - 2.5 mg/dL Final  13/24/4010  2:72 AM 1.4* 1.5 - 2.5 mg/dL Final     CBG (last 3)   Basename 04/23/12 0435 04/23/12 0030 04/22/12 2134  GLUCAP 191* 214* 228*    Scheduled Meds:    . antiseptic oral rinse  15 mL Mouth Rinse QID  . chlorhexidine  15 mL Mouth Rinse BID  . clonazePAM  0.5 mg Oral BID  . diltiazem  120 mg Oral Daily  . feeding supplement  30 mL Per Tube TID WC  . folic acid  1 mg Oral Daily  . furosemide  40 mg Intravenous BID  . heparin subcutaneous  5,000 Units Subcutaneous Q8H  . insulin aspart  0-15 Units Subcutaneous Q4H  . insulin aspart  4 Units Subcutaneous Q4H  . insulin glargine  5 Units Subcutaneous QHS  . metoprolol tartrate  50 mg Per Tube BID  . multivitamin  5 mL Oral Daily  . pantoprazole sodium  40 mg Per Tube Q24H  . potassium chloride  40 mEq Per Tube BID  . rocuronium  0.6 mg/kg Intravenous Once  . sodium chloride  10-40 mL Intracatheter Q12H  . thiamine  100 mg Oral Daily    Continuous Infusions:    . sodium chloride 500 mL (04/20/12 1821)  . dextrose 40 mL/hr at 04/16/12 0700  . feeding supplement (JEVITY 1.2 CAL) 1,000 mL (04/22/12 1350)   Jarold Motto MS, RD, LDN Pager: [phone] After-hours pager: [phone]

## 2012-04-23 NOTE — Progress Notes (Signed)
2230: Pt Vent alarm. In to check pt. Pt with legs over side of bed with arms on railing attempting to get OOB. Pt stated he wanted water. Informed pt about condition and unable to give water. Pt request Ice. Informed pt of reason unable to do so. Pt grimacing and guarding as if in pain, pt denies having pain. Informed pt that this nurse will get pain medicine. 2245: Noted pt with legs over railing attempting to get OOB. Repositioned pt and informed that he had to keep legs in bed.  2300: Alarm sounds, pt removed vent from trach. Safety mitts placed.  Ativan administered, see Mar

## 2012-04-23 NOTE — Progress Notes (Signed)
PULMONARY  / CRITICAL CARE MEDICINE  Name: Johnny [name] MRN: [id number] DOB: March 13, 1935    LOS: 22  REFERRING MD :  TRH  CHIEF COMPLAINT:  Alcohol withdrawal/HCAP >> Cardiac arrest  BRIEF PATIENT DESCRIPTION:  78 yo male former smoker admitted 04/01/2012 with shakes and incontinent of urine/feces and found to have pneumonia in setting of ETOH abuse/withdrawal.  Developed septic shock, and PEA on 12/13 with VDRF and 20 min before ROSC.  PCCM assumed care from 12/13.   LINES / TUBES: 12/12 ETT >>12/20 12/20 Tstomy (JY) >> 12/12 L IJ CVL >> 12/13 Lt radial Aline>>12/14 12/21 PEG ([facility])>>>  CULTURES: 12/12 resp >>GPC in pairs and candida albicans. 12/12 blood >>NTD 12/15 >> coag neg staph  12/12 urine >>negative 12/12 influenza >negative 12/12 urine strep >> negative 12/12 urine leg >>negative 12/25 urine>>>GNR>>>psuedomonas ss zoysn 12/25 Sputum >>> multiple morphology 12/25 B cx x2>>>NTD  ANTIBIOTICS: 12/12 Vanc (HCAP) >>12/15>>>12/26>>>12/28 12/12 Cefepime (HCAP) >>12/15 12/12 Levaqin (HCAP) >>12/20 12/26 Zosyn>>>1/1  SIGNIFICANT EVENTS:  12/12 admission and PEA arrest 12/14 Off pressors 12/20 Trach 12/24 A flutter, cardizem gtt started  TESTS: 12/12 CT head >> chronic microvascular ischemic changes 12/13 EEG >> moderate-to-severe generalized continuous slowing of cerebral activity 12/13 Echo >> EF 50 to 55% (tested while on dobutamine) 12/13 Abd u/s >> hepatic steatosis, medical renal disease, gallbladder sludge with small stones  LEVEL OF CARE:  SDU PRIMARY SERVICE:  PCCM CONSULTANTS:  None CODE STATUS: full  DIET:  Tube feeds DVT Px:  SQ heparin  GI Px:  Pantoprazole  INTERVAL HISTORY:  On full vent support.  VITAL SIGNS: Temp:  [98.3 F (36.8 C)-100.6 F (38.1 C)] 98.9 F (37.2 C) (01/03 1125) Pulse Rate:  [86-106] 91  (01/03 1200) Resp:  [18-30] 18  (01/03 1200) BP: (123-168)/(39-96) 132/61 mmHg (01/03 1200) SpO2:  [100 %] 100 % (01/03  1200) FiO2 (%):  [30 %] 30 % (01/03 1138) VENTILATOR SETTINGS: Vent Mode:  [-] PCV FiO2 (%):  [30 %] 30 % Set Rate:  [18 bmp] 18 bmp PEEP:  [5 cmH20] 5 cmH20 Plateau Pressure:  [16 cmH20-22 cmH20] 22 cmH20   PHYSICAL EXAMINATION: Gen: No distress, asleep HEENT: Trach site clean PULM: Resps even non labored on full support, Decreased breath sounds, no wheeze. CV: s1s2 regular, no murmur. AB: decreased bowel sounds, soft, non tender, PEG site clean.  Ext: no edema. Derm: no rashes Neuro:  Asleep, easily arousable, follows some simple commands    LABS: CBC  Lab 04/23/12 0555 04/21/12 0500 04/20/12 0500  WBC 16.4* -- --  HGB 8.1* 8.1* 8.2*  HCT 24.4* 24.4* 24.2*  PLT 426* 415* 384   Chemistry  Lab 04/23/12 0555 04/21/12 0500 04/20/12 0500 04/18/12 0430 04/17/12 0335  NA 142 137 132* -- --  K 3.5 3.5 3.2* -- --  CL 105 98 98 -- --  CO2 24 23 21  -- --  BUN 57* 46* 29* -- --  CREATININE 1.25 1.59* 1.38* -- --  CALCIUM 9.1 8.7 8.4 -- --  MG -- 1.8 -- 2.1 1.4*  PHOS -- 3.6 -- 3.1 3.3  GLUCOSE 173* 196* 241* -- --   CBG trend  Lab 04/23/12 1125 04/23/12 0726 04/23/12 0435 04/23/12 0030 04/22/12 2134  GLUCAP 148* 166* 191* 214* 228*    Intake/Output Summary (Last 24 hours) at 04/23/12 1410 Last data filed at 04/23/12 1341  Gross per 24 hour  Intake   1585 ml  Output   2300 ml  Net   -715 ml   IMAGING:  No results found.  ASSESSMENT / PLAN:  PULMONARY  ASSESSMENT: Acute respiratory failure in setting of PNA and cardiac arrest with hx of smoking.  Probable COPD. Failure to wean from vent s/pTstomy  PLAN:   Pressure support weaning attempts as tolerated nocturnal rest mandatory (PCV) Daily assessment need for diuresis  F/u CXR intermittently Continue PRN combivent.  CARDIOVASCULAR  ASSESSMENT:  PEA-Likely related to pneumonia, acidosis, hypoxic respiratory failure in setting of ETOH withdrawal. Septic shock 2nd to PNA- Resolved 12/14. Hypervolemia  -Improved. A flutter developed 12/24.  PLAN:  Continue lopressor 50 mg per tube q12h and PO dilt. Goal even to negative fluid balance  RENAL  ASSESSMENT:   Acute renal failure 2nd to shock-Baseline creatinine 0.95 from 01/05/12.  Lactic acidosis related to shock and liver dysfx.-Resolved. Hypokalemia, hypomagnesemia, hypophosphatemia. Hypernatremia.-2nd to diuresis. Resolved. PLAN:   Monitor renal fx, urine outpt F/u and replace electrolytes as needed  GASTROINTESTINAL  ASSESSMENT:   Protein calorie malnutrition with hx of ETOH. Fatty liver 2nd to ETOH. PLAN:   Continue tube feeds Continue Thiamine, folic acid, MVI.  HEMATOLOGIC  ASSESSMENT:    Iron/TIBC/Ferritin    Component Value Date/Time   IRON <10* 04/19/2012 1229   TIBC Not calculated due to Iron <10. 04/19/2012 1229   FERRITIN 1152* 04/19/2012 1229   Anemia, thrombocytopenia likely related to ETOH and chronic disease with critical illness. PLAN:  F/u CBC intermittently Transfuse for Hb < 7 Repeat iron studies, and then decide to add feosol  INFECTIOUS  ASSESSMENT:   Pneumonia Fever 12/24 (Tm 102.5) - resolved  Poor dentition. UTI with pa. PLAN:   Will need dental evaluation when more stable Monitor off abx  ENDOCRINE  ASSESSMENT:   DM type II. PLAN:   SSI Continue lantus   NEUROLOGIC  ASSESSMENT:   ETOH withdrawal. Acute encephalopathy 2nd to sepsis, hypoxia, and cardiac arrest with ?anoxic injury.  PLAN:   Fentanyl PRN. Change klonopin to 0.5 mg qhs on 1/03 Cont PRN ativan/xanax   [facility].  Coralyn Helling, MD [facility] Pulmonary/Critical Care 04/23/2012, 2:16 PM Pager:  [phone] After 3pm call: [phone]

## 2012-04-24 ENCOUNTER — Inpatient Hospital Stay (HOSPITAL_COMMUNITY): Payer: Medicare PPO

## 2012-04-24 DIAGNOSIS — I4892 Unspecified atrial flutter: Secondary | ICD-10-CM

## 2012-04-24 LAB — GLUCOSE, CAPILLARY
Glucose-Capillary: 187 mg/dL — ABNORMAL HIGH (ref 70–99)
Glucose-Capillary: 164 mg/dL — ABNORMAL HIGH (ref 70–99)
Glucose-Capillary: 207 mg/dL — ABNORMAL HIGH (ref 70–99)
Glucose-Capillary: 240 mg/dL — ABNORMAL HIGH (ref 70–99)

## 2012-04-24 LAB — BASIC METABOLIC PANEL
BUN: 54 mg/dL — ABNORMAL HIGH (ref 6–23)
Calcium: 9.2 mg/dL (ref 8.4–10.5)
Chloride: 108 mEq/L (ref 96–112)
Creatinine, Ser: 1.17 mg/dL (ref 0.50–1.35)
GFR calc Af Amer: 69 mL/min — ABNORMAL LOW (ref >90)
GFR calc non Af Amer: 59 mL/min — ABNORMAL LOW (ref >90)

## 2012-04-24 LAB — IRON AND TIBC

## 2012-04-24 LAB — CBC
HCT: 24.8 % — ABNORMAL LOW (ref 39.0–52.0)
MCHC: 33.1 g/dL (ref 30.0–36.0)
Platelets: 439 10*3/uL — ABNORMAL HIGH (ref 150–400)
RDW: 15.4 % (ref 11.5–15.5)

## 2012-04-24 NOTE — Progress Notes (Addendum)
PULMONARY  / CRITICAL CARE MEDICINE  Name: NICOLIS BOODY MRN: [id number] DOB: 22-Apr-1934    LOS: 23  REFERRING MD :  TRH  CHIEF COMPLAINT:  Alcohol withdrawal/HCAP >> Cardiac arrest  BRIEF PATIENT DESCRIPTION:  78 yo male former smoker admitted 04/01/2012 with shakes and incontinent of urine/feces and found to have pneumonia in setting of ETOH abuse/withdrawal.  Developed septic shock, and PEA on 12/13 with VDRF and 20 min before ROSC.  PCCM assumed care from 12/13.   LINES / TUBES: 12/12 ETT >>12/20 12/20 Tstomy (JY) >> 12/12 L IJ CVL >> 12/13 Lt radial Aline>>12/14 12/21 PEG ([facility])>>>  CULTURES: 12/12 resp >>GPC in pairs and candida albicans. 12/12 blood >>NTD 12/15 >> coag neg staph  12/12 urine >>negative 12/12 influenza >negative 12/12 urine strep >> negative 12/12 urine leg >>negative 12/25 urine>>>GNR>>>psuedomonas ss zoysn 12/25 Sputum >>> multiple morphology 12/25 B cx x2> neg   ANTIBIOTICS: 12/12 Vanc (HCAP) >>12/15>>>12/26>>>12/28 12/12 Cefepime (HCAP) >>12/15 12/12 Levaqin (HCAP) >>12/20 12/26 Zosyn>>>1/1  SIGNIFICANT EVENTS:  12/12 admission and PEA arrest 12/14 Off pressors 12/20 Trach 12/24 A flutter, cardizem gtt started  TESTS: 12/12 CT head >> chronic microvascular ischemic changes 12/13 EEG >> moderate-to-severe generalized continuous slowing of cerebral activity 12/13 Echo >> EF 50 to 55% (tested while on dobutamine) 12/13 Abd u/s >> hepatic steatosis, medical renal disease, gallbladder sludge with small stones  LEVEL OF CARE:  SDU PRIMARY SERVICE:  PCCM CONSULTANTS:  None CODE STATUS: full  DIET:  Tube feeds DVT Px:  SQ heparin  GI Px:  Pantoprazole  INTERVAL HISTORY:  On full vent support.  VITAL SIGNS: Temp:  [98 F (36.7 C)-101.8 F (38.8 C)] 98.2 F (36.8 C) (01/04 1200) Pulse Rate:  [93-113] 113  (01/04 1200) Resp:  [18-29] 23  (01/04 1200) BP: (132-166)/(53-90) 165/76 mmHg (01/04 1200) SpO2:  [97 %-100 %] 97 % (01/04  1200) FiO2 (%):  [30 %] 30 % (01/04 1200) Weight:  [156 lb 8.4 oz (71 kg)] 156 lb 8.4 oz (71 kg) (01/04 0526) VENTILATOR SETTINGS: Vent Mode:  [-] PCV FiO2 (%):  [30 %] 30 % Set Rate:  [18 bmp] 18 bmp PEEP:  [5 cmH20] 5 cmH20 Plateau Pressure:  [15 cmH20-24 cmH20] 21 cmH20   PHYSICAL EXAMINATION: Gen: No distress nods approp HEENT: Trach site clean PULM: Resps even non labored on full support, Decreased breath sounds, no wheeze. CV: s1s2 regular, no murmur. AB: decreased bowel sounds, soft, non tender, PEG site clean.  Ext: no edema. Derm: no rashes Neuro:  Sticks out tongue to command   LABS: CBC  Lab 04/24/12 0400 04/23/12 0555 04/21/12 0500  WBC 21.8* -- --  HGB 8.2* 8.1* 8.1*  HCT 24.8* 24.4* 24.4*  PLT 439* 426* 415*   Chemistry  Lab 04/24/12 0400 04/23/12 0555 04/21/12 0500 04/18/12 0430  NA 142 142 137 --  K 3.6 3.5 3.5 --  CL 108 105 98 --  CO2 22 24 23  --  BUN 54* 57* 46* --  CREATININE 1.17 1.25 1.59* --  CALCIUM 9.2 9.1 8.7 --  MG -- -- 1.8 2.1  PHOS -- -- 3.6 3.1  GLUCOSE 220* 173* 196* --   CBG trend  Lab 04/24/12 1158 04/24/12 0747 04/24/12 0401 04/23/12 2330 04/23/12 1937  GLUCAP 207* 164* 187* 197* 209*    Intake/Output Summary (Last 24 hours) at 04/24/12 1452 Last data filed at 04/24/12 1200  Gross per 24 hour  Intake   1370 ml  Output  950 ml  Net    420 ml   IMAGING:  Dg Chest Port 1 View  04/24/2012  *RADIOLOGY REPORT*  Clinical Data: Follow-up pleural effusions.  Coronary artery disease.  The on ventilator.  PORTABLE CHEST - 1 VIEW  Comparison: 04/21/2012  Findings: Decreased atelectasis or infiltrate is seen at the right lung base since prior study.  Left lung remains clear.  Heart size is normal.  Tracheostomy tube and right arm PICC line remain in appropriate position.  IMPRESSION: Resolving right basilar atelectasis versus infiltrate.  No acute findings.   Original Report Authenticated By: Myles Rosenthal, M.D.     ASSESSMENT /  PLAN:  PULMONARY  ASSESSMENT: Acute respiratory failure in setting of PNA and cardiac arrest with hx of smoking.  Probable COPD. Failure to wean from vent s/pTstomy  PLAN:   Pressure support weaning attempts as tolerated nocturnal rest mandatory (PCV) for now Daily assessment need for diuresis  F/u CXR intermittently Continue PRN combivent.  CARDIOVASCULAR  ASSESSMENT:  PEA-Likely related to pneumonia, acidosis, hypoxic respiratory failure in setting of ETOH withdrawal. Septic shock 2nd to PNA- Resolved 12/14. Hypervolemia -Improved. A flutter developed 12/24.  PLAN:  Continue lopressor 50 mg per tube q12h and PO dilt. Goal even to negative fluid balance  RENAL  ASSESSMENT:   Acute renal failure 2nd to shock-Baseline creatinine 0.95 from 01/05/12.  Lactic acidosis related to shock and liver dysfx.-Resolved. Hypokalemia, hypomagnesemia, hypophosphatemia. Hypernatremia.-2nd to diuresis. Resolved. PLAN:   Monitor renal fx, urine outpt F/u and replace electrolytes as needed  GASTROINTESTINAL  ASSESSMENT:   Protein calorie malnutrition with hx of ETOH. Fatty liver 2nd to ETOH. PLAN:   Continue tube feeds Continue Thiamine, folic acid, MVI.  HEMATOLOGIC  ASSESSMENT:    Iron/TIBC/Ferritin    Component Value Date/Time   IRON <10* 04/24/2012 0400   TIBC Not calculated due to Iron <10. 04/24/2012 0400   FERRITIN 1152* 04/19/2012 1229   Anemia, thrombocytopenia likely related to ETOH and chronic disease with critical illness. PLAN:  F/u CBC intermittently Transfuse for Hb < 7 Repeat iron studies, and then decide to add feosol  INFECTIOUS  ASSESSMENT:   Pneumonia Fever 12/24 (Tm 102.5) - resolved  Poor dentition. UTI with pa. PLAN:   Will need dental evaluation when more stable Monitor low grade fever off abx, explained to daughter at bedside 1/4  may not be infection source and risk of creating more problems with too many abx  ENDOCRINE  ASSESSMENT:   DM  type II. PLAN:   SSI Continue lantus   NEUROLOGIC  ASSESSMENT:   ETOH withdrawal. Acute encephalopathy 2nd to sepsis, hypoxia, and cardiac arrest with ?anoxic injury.  PLAN:   Fentanyl PRN. Change klonopin to 0.5 mg qhs on 1/03 Cont PRN ativan/xanax   [facility].  Sandrea Hughs, MD Pulmonary and Critical Care Medicine [facility] Cell [phone]

## 2012-04-25 DIAGNOSIS — R5383 Other fatigue: Secondary | ICD-10-CM

## 2012-04-25 MED ORDER — FERROUS SULFATE 220 (44 FE) MG/5ML PO ELIX
220.0000 mg | ORAL_SOLUTION | Freq: Three times a day (TID) | ORAL | Status: DC
  Administered 2012-04-25 – 2012-04-26 (×2): 220 mg via ORAL
  Filled 2012-04-25 (×5): qty 5

## 2012-04-25 NOTE — Progress Notes (Signed)
Rt called to pt room due to >RR. Small cuff leak detected and suction with  lavage down per e-link, no significant amount of secretions or plugs were seen. Rn gave pt more sedation and he calmed down.

## 2012-04-25 NOTE — Progress Notes (Signed)
PULMONARY  / CRITICAL CARE MEDICINE  Name: ANGELLO CHIEN MRN: [id number] DOB: February 27, 1935    LOS: 24  REFERRING MD :  TRH  CHIEF COMPLAINT:  Alcohol withdrawal/HCAP >> Cardiac arrest  BRIEF PATIENT DESCRIPTION:  78 yo male former smoker admitted 04/01/2012 with shakes and incontinent of urine/feces and found to have pneumonia in setting of ETOH abuse/withdrawal.  Developed septic shock, and PEA on 12/13 with VDRF and 20 min before ROSC.  PCCM assumed care from 12/13.   LINES / TUBES: 12/12 ETT >>12/20 12/20 Tstomy (JY) >> 12/12 L IJ CVL >> 12/13 Lt radial Aline>>12/14 12/21 PEG ([facility])>>>  CULTURES: 12/12 resp >>GPC in pairs and candida albicans. 12/12 blood >>NTD 12/15 >> coag neg staph  12/12 urine >>negative 12/12 influenza >negative 12/12 urine strep >> negative 12/12 urine leg >>negative 12/25 urine>>>GNR>>>psuedomonas ss zoysn 12/25 Sputum >>> multiple morphology 12/25 B cx x2> neg   ANTIBIOTICS: 12/12 Vanc (HCAP) >>12/15>>>12/26>>>12/28 12/12 Cefepime (HCAP) >>12/15 12/12 Levaqin (HCAP) >>12/20 12/26 Zosyn>>>1/1  SIGNIFICANT EVENTS:  12/12 admission and PEA arrest 12/14 Off pressors 12/20 Trach 12/24 A flutter, cardizem gtt started  TESTS: 12/12 CT head >> chronic microvascular ischemic changes 12/13 EEG >> moderate-to-severe generalized continuous slowing of cerebral activity 12/13 Echo >> EF 50 to 55% (tested while on dobutamine) 12/13 Abd u/s >> hepatic steatosis, medical renal disease, gallbladder sludge with small stones  LEVEL OF CARE:  SDU PRIMARY SERVICE:  PCCM CONSULTANTS:  None CODE STATUS: full  DIET:  Tube feeds DVT Px:  SQ heparin  GI Px:  Pantoprazole  INTERVAL HISTORY:  On full vent support.  VITAL SIGNS: Temp:  [97.6 F (36.4 C)-99.7 F (37.6 C)] 97.6 F (36.4 C) (01/05 1227) Pulse Rate:  [93-124] 98  (01/05 1227) Resp:  [17-28] 19  (01/05 1227) BP: (125-181)/(43-95) 164/43 mmHg (01/05 1227) SpO2:  [99 %-100 %] 100 % (01/05  1227) FiO2 (%):  [30 %] 30 % (01/05 1227) Weight:  [158 lb 11.7 oz (72 kg)] 158 lb 11.7 oz (72 kg) (01/05 0500) VENTILATOR SETTINGS: Vent Mode:  [-] PCV FiO2 (%):  [30 %] 30 % Set Rate:  [18 bmp] 18 bmp PEEP:  [5 cmH20] 5 cmH20 Plateau Pressure:  [20 cmH20-22 cmH20] 20 cmH20   PHYSICAL EXAMINATION: Gen: No distress nods approp HEENT: Trach site clean PULM: Resps even non labored on full support, Decreased breath sounds, no wheeze. CV: s1s2 regular, no murmur. AB: decreased bowel sounds, soft, non tender, PEG site clean.  Ext: no edema. Derm: no rashes Neuro:  Sticks out tongue to command   LABS: CBC  Lab 04/24/12 0400 04/23/12 0555 04/21/12 0500  WBC 21.8* -- --  HGB 8.2* 8.1* 8.1*  HCT 24.8* 24.4* 24.4*  PLT 439* 426* 415*   Chemistry  Lab 04/24/12 0400 04/23/12 0555 04/21/12 0500  NA 142 142 137  K 3.6 3.5 3.5  CL 108 105 98  CO2 22 24 23   BUN 54* 57* 46*  CREATININE 1.17 1.25 1.59*  CALCIUM 9.2 9.1 8.7  MG -- -- 1.8  PHOS -- -- 3.6  GLUCOSE 220* 173* 196*   CBG trend  Lab 04/25/12 1229 04/25/12 0704 04/25/12 0349 04/25/12 0004 04/24/12 1949  GLUCAP 167* 137* 229* 234* 240*    Intake/Output Summary (Last 24 hours) at 04/25/12 1425 Last data filed at 04/25/12 1200  Gross per 24 hour  Intake   1480 ml  Output    300 ml  Net   1180 ml  IMAGING:  Dg Chest Port 1 View  04/24/2012  *RADIOLOGY REPORT*  Clinical Data: Follow-up pleural effusions.  Coronary artery disease.  The on ventilator.  PORTABLE CHEST - 1 VIEW  Comparison: 04/21/2012  Findings: Decreased atelectasis or infiltrate is seen at the right lung base since prior study.  Left lung remains clear.  Heart size is normal.  Tracheostomy tube and right arm PICC line remain in appropriate position.  IMPRESSION: Resolving right basilar atelectasis versus infiltrate.  No acute findings.   Original Report Authenticated By: Myles Rosenthal, M.D.     ASSESSMENT / PLAN:  PULMONARY  ASSESSMENT: Acute  respiratory failure in setting of PNA and cardiac arrest with hx of smoking.  Probable COPD. Failure to wean from vent s/pTstomy  PLAN:   Pressure support weaning attempts as tolerated nocturnal rest mandatory (PCV) for now Daily assessment need for diuresis  F/u CXR intermittently Continue PRN combivent.  CARDIOVASCULAR  ASSESSMENT:  PEA-Likely related to pneumonia, acidosis, hypoxic respiratory failure in setting of ETOH withdrawal. Septic shock 2nd to PNA- Resolved 12/14. Hypervolemia -Improved. A flutter developed 12/24.  PLAN:  Continue lopressor 50 mg per tube q12h and PO dilt. Goal even to negative fluid balance  RENAL  ASSESSMENT:   Acute renal failure 2nd to shock-Baseline creatinine 0.95 from 01/05/12.  Lactic acidosis related to shock and liver dysfx.-Resolved. Hypokalemia, hypomagnesemia, hypophosphatemia. Hypernatremia.-2nd to diuresis. Resolved. PLAN:   Monitor renal fx, urine outpt F/u and replace electrolytes as needed  GASTROINTESTINAL  ASSESSMENT:   Protein calorie malnutrition with hx of ETOH. Fatty liver 2nd to ETOH. PLAN:   Continue tube feeds Continue Thiamine, folic acid, MVI.  HEMATOLOGIC  Lab 04/24/12 0400 04/23/12 0555 04/21/12 0500  HGB 8.2* 8.1* 8.1*     ASSESSMENT:    Iron/TIBC/Ferritin    Component Value Date/Time   IRON <10* 04/24/2012 0400   TIBC Not calculated due to Iron <10. 04/24/2012 0400   FERRITIN 1209* 04/24/2012 0400   Anemia, thrombocytopenia likely related to ETOH and chronic disease with critical illness. PLAN:  Prob fe deficient with ferritin falsely elevated > rx Feosol  INFECTIOUS  ASSESSMENT:   Pneumonia Fever 12/24 (Tm 102.5) - resolved  Poor dentition. UTI with pa. PLAN:   Will need dental evaluation when more stable Monitor low grade fever off abx, explained to daughter at bedside 1/4  may not be infection source and risk of creating more problems with too many abx  ENDOCRINE  ASSESSMENT:   DM type  II. PLAN:   SSI Continue lantus   NEUROLOGIC  ASSESSMENT:   ETOH withdrawal. Acute encephalopathy 2nd to sepsis, hypoxia, and cardiac arrest with ?anoxic injury.  PLAN:   Fentanyl PRN. Change klonopin to 0.5 mg qhs on 1/03 Cont PRN ativan/xanax   [facility].  Sandrea Hughs, MD Pulmonary and Critical Care Medicine [facility] Cell [phone]

## 2012-04-25 NOTE — Progress Notes (Signed)
Physical Therapy Evaluation Patient Details Name: Johnny [name] MRN: [id number] DOB: 30-Jul-1934 Today's Date: 04/25/2012 Time: 4098-1191 PT Time Calculation (min): 12 min  PT Assessment / Plan / Recommendation Clinical Impression  Patient is a 78 yo male admitted with HCAP.  Complications of cardiac arrest, VDRF (trach), and encephalopathy.  Patient will benefit from acute PT to maximize mobility and ability to tolerate OOB in chair safely.  Recommend ROM with bathing and lift equipment for OOB.  Agree with need for LTACH/SNF at discharge.    PT Assessment  Patient needs continued PT services (Will  complete 1 week trial for potential progress.)    Follow Up Recommendations  SNF;LTACH    Does the patient have the potential to tolerate intense rehabilitation      Barriers to Discharge Other (comment) (Need for vent support)      Equipment Recommendations  None recommended by PT    Recommendations for Other Services     Frequency Min 2X/week    Precautions / Restrictions Precautions Precautions: Fall Precaution Comments: Extremely high extensor tone Restrictions Weight Bearing Restrictions: No   Pertinent Vitals/Pain       Mobility  Bed Mobility Bed Mobility: Rolling Left;Rolling Right Rolling Right: 1: +2 Total assist Rolling Right: Patient Percentage: 20% Rolling Left: 1: +2 Total assist Rolling Left: Patient Percentage: 20% Details for Bed Mobility Assistance: Verbal and tactile cues.  Patient attempts to roll, with minimal movement of shoulders toward EOB.  Attempted to flex bil. LE's to assist with rolling - difficult due to increased extensor tone Transfers Transfers: Not assessed    Shoulder Instructions     Exercises Other Exercises Other Exercises: Supine hip/trunk rotation in hook lying.  Required +2 to maintain LE control.  Other Exercises: Sidelying flexion - attempted trunk and LE flexion to promote relaxation and increased ROM.  Required +2 assist to  maintain LE control.   PT Diagnosis: Altered mental status;Generalized weakness  PT Problem List: Decreased strength;Decreased range of motion;Decreased activity tolerance;Decreased mobility;Decreased cognition;Cardiopulmonary status limiting activity;Impaired tone PT Treatment Interventions: Therapeutic exercise;Balance training;Neuromuscular re-education;Patient/family education   PT Goals Acute Rehab PT Goals PT Goal Formulation: Patient unable to participate in goal setting Time For Goal Achievement: 05/09/12 Potential to Achieve Goals: Fair Pt will Roll Supine to Right Side: with mod assist PT Goal: Rolling Supine to Right Side - Progress: Goal set today Pt will Roll Supine to Left Side: with mod assist PT Goal: Rolling Supine to Left Side - Progress: Goal set today Pt will Sit at Edge of Bed: with mod assist;3-5 min;with bilateral upper extremity support PT Goal: Sit at Edge Of Bed - Progress: Goal set today  Visit Information  Last PT Received On: 04/25/12 Assistance Needed: +2    Subjective Data  Subjective: Nods/shakes head for yes/no - inconsistent Patient Stated Goal: Unable to state   Prior Functioning  Home Living Available Help at Discharge: Other (Comment);Skilled Nursing Facility [facility]) Additional Comments: Unable to provide information on prior living situation. Prior Function Comments: Unable to provide PLOF information Communication Communication: Tracheostomy    Cognition  Overall Cognitive Status: Impaired Area of Impairment: Other (comment);Safety/judgement (Difficult to assess due to decreased communication.) Arousal/Alertness: Lethargic Orientation Level: Other (comment) (Difficult to assess due to communication) Behavior During Session: Restless (Patient agitated attempting to climb over rails last pm) Safety/Judgement: Impulsive;Decreased awareness of need for assistance Safety/Judgement - Other Comments: Agitated - attempting to get OOB, pulling  off vent from trach    Extremity/Trunk Assessment Right  Upper Extremity Assessment RUE ROM/Strength/Tone: Deficits RUE ROM/Strength/Tone Deficits: Decreased ROM with increased tone.  Able to lift minimally to command. Left Upper Extremity Assessment LUE ROM/Strength/Tone: Deficits LUE ROM/Strength/Tone Deficits: Decreased ROM with increased tone.  Able to lift minimally to command. Right Lower Extremity Assessment RLE ROM/Strength/Tone: Deficits RLE ROM/Strength/Tone Deficits: Able to initiate movement to command 3-/5; Increased extensor tone with attempts at ROM.  Decreased ROM Left Lower Extremity Assessment LLE ROM/Strength/Tone: Deficits LLE ROM/Strength/Tone Deficits: Able to initiate movement to command 3-/5; Increased extensor tone with attempts at ROM.  Decreased ROM   Balance    End of Session PT - End of Session Activity Tolerance: Treatment limited secondary to medical complications (Comment) Patient left: in bed;with call bell/phone within reach;with bed alarm set;with restraints reapplied (mittens) Nurse Communication: Need for lift equipment  GP     Vena Austria 04/25/2012, 7:50 PM Durenda Hurt. Renaldo Fiddler, [facility] Acute Rehab Services Pager [phone]

## 2012-04-26 DIAGNOSIS — K089 Disorder of teeth and supporting structures, unspecified: Secondary | ICD-10-CM

## 2012-04-26 DIAGNOSIS — N390 Urinary tract infection, site not specified: Secondary | ICD-10-CM

## 2012-04-26 DIAGNOSIS — E8770 Fluid overload, unspecified: Secondary | ICD-10-CM

## 2012-04-26 DIAGNOSIS — J449 Chronic obstructive pulmonary disease, unspecified: Secondary | ICD-10-CM

## 2012-04-26 DIAGNOSIS — Z9911 Dependence on respirator [ventilator] status: Secondary | ICD-10-CM

## 2012-04-26 DIAGNOSIS — E46 Unspecified protein-calorie malnutrition: Secondary | ICD-10-CM

## 2012-04-26 DIAGNOSIS — E878 Other disorders of electrolyte and fluid balance, not elsewhere classified: Secondary | ICD-10-CM

## 2012-04-26 DIAGNOSIS — T888XXA Other specified complications of surgical and medical care, not elsewhere classified, initial encounter: Secondary | ICD-10-CM

## 2012-04-26 LAB — GLUCOSE, CAPILLARY
Glucose-Capillary: 170 mg/dL — ABNORMAL HIGH (ref 70–99)
Glucose-Capillary: 108 mg/dL — ABNORMAL HIGH (ref 70–99)
Glucose-Capillary: 171 mg/dL — ABNORMAL HIGH (ref 70–99)

## 2012-04-26 MED ORDER — FERROUS SULFATE 300 (60 FE) MG/5ML PO SYRP
220.0000 mg | ORAL_SOLUTION | Freq: Three times a day (TID) | ORAL | Status: DC
  Administered 2012-04-26 – 2012-05-01 (×17): 220 mg via ORAL
  Administered 2012-05-02: 06:00:00 via ORAL
  Administered 2012-05-02 – 2012-05-07 (×15): 220 mg via ORAL
  Filled 2012-04-26 (×38): qty 5

## 2012-04-26 MED ORDER — FERROUS SULFATE 300 (60 FE) MG/5ML PO SYRP
220.0000 mg | ORAL_SOLUTION | Freq: Three times a day (TID) | ORAL | Status: DC
  Filled 2012-04-26 (×4): qty 5

## 2012-04-26 NOTE — Progress Notes (Signed)
PULMONARY  / CRITICAL CARE MEDICINE  Name: ORANGE HILLIGOSS MRN: [id number] DOB: 12/18/1934    LOS: 25  REFERRING MD :  TRH  CHIEF COMPLAINT:  Alcohol withdrawal/HCAP >> Cardiac arrest  BRIEF PATIENT DESCRIPTION:  78 yo male former smoker admitted 04/01/2012 with shakes and incontinent of urine/feces and found to have pneumonia in setting of ETOH abuse/withdrawal.  Developed septic shock, and PEA on 12/13 with VDRF and 20 min before ROSC.  PCCM assumed care from 12/13.   LINES / TUBES: 12/12 ETT >>12/20 12/20 Tstomy (JY) >> 12/12 L IJ CVL >> 12/13 Lt radial Aline>>12/14 12/21 PEG ([facility])>>>  CULTURES: 12/12 resp >>GPC in pairs and candida albicans. 12/12 blood >>NTD 12/15 >> coag neg staph  12/12 urine >>negative 12/12 influenza >negative 12/12 urine strep >> negative 12/12 urine leg >>negative 12/25 urine>>>GNR>>>psuedomonas ss zoysn 12/25 Sputum >>> multiple morphology 12/25 B cx x2> neg   ANTIBIOTICS: 12/12 Vanc (HCAP) >>12/15>>>12/26>>>12/28 12/12 Cefepime (HCAP) >>12/15 12/12 Levaqin (HCAP) >>12/20 12/26 Zosyn>>>1/1  SIGNIFICANT EVENTS:  12/12 admission and PEA arrest 12/14 Off pressors 12/20 Trach 12/24 A flutter, cardizem gtt started  TESTS: 12/12 CT head >> chronic microvascular ischemic changes 12/13 EEG >> moderate-to-severe generalized continuous slowing of cerebral activity 12/13 Echo >> EF 50 to 55% (tested while on dobutamine) 12/13 Abd u/s >> hepatic steatosis, medical renal disease, gallbladder sludge with small stones  LEVEL OF CARE:  SDU PRIMARY SERVICE:  PCCM CONSULTANTS:  None CODE STATUS: full  DIET:  Tube feeds DVT Px:  SQ heparin  GI Px:  Pantoprazole  INTERVAL HISTORY:  Weaning on PMV  VITAL SIGNS: Temp:  [97.4 F (36.3 C)-98.9 F (37.2 C)] 98.6 F (37 C) (01/06 0800) Pulse Rate:  [87-118] 97  (01/06 0857) Resp:  [16-33] 18  (01/06 0857) BP: (98-181)/(43-77) 134/59 mmHg (01/06 0857) SpO2:  [99 %-100 %] 100 % (01/06  0800) FiO2 (%):  [30 %] 30 % (01/06 0853) Weight:  [73 kg (160 lb 15 oz)] 73 kg (160 lb 15 oz) (01/05 2349) VENTILATOR SETTINGS: Vent Mode:  [-] PSV FiO2 (%):  [30 %] 30 % Set Rate:  [18 bmp] 18 bmp PEEP:  [5 cmH20] 5 cmH20 Pressure Support:  [15 cmH20] 15 cmH20 Plateau Pressure:  [15 cmH20-20 cmH20] 15 cmH20   PHYSICAL EXAMINATION: Gen: No distress nods approp HEENT: Trach site clean PULM: Resps even non labored on full support, Decreased breath sounds, no wheeze. CV: s1s2 regular, no murmur. AB: decreased bowel sounds, soft, non tender, PEG site clean.  Ext: no edema. Derm: no rashes Neuro:  Sticks out tongue to command, wiggles finger  LABS: CBC  Lab 04/24/12 0400 04/23/12 0555 04/21/12 0500  WBC 21.8* -- --  HGB 8.2* 8.1* 8.1*  HCT 24.8* 24.4* 24.4*  PLT 439* 426* 415*   Chemistry  Lab 04/24/12 0400 04/23/12 0555 04/21/12 0500  NA 142 142 137  K 3.6 3.5 3.5  CL 108 105 98  CO2 22 24 23   BUN 54* 57* 46*  CREATININE 1.17 1.25 1.59*  CALCIUM 9.2 9.1 8.7  MG -- -- 1.8  PHOS -- -- 3.6  GLUCOSE 220* 173* 196*   CBG trend  Lab 04/26/12 0815 04/26/12 0419 04/25/12 2348 04/25/12 1952 04/25/12 1541  GLUCAP 108* 170* 187* 118* 199*    Intake/Output Summary (Last 24 hours) at 04/26/12 0932 Last data filed at 04/26/12 0800  Gross per 24 hour  Intake   1720 ml  Output   1050 ml  Net    670 ml   IMAGING:  No results found.  Active Problems:  Anoxic Encephalopathy: due to hypoxia after cardiac arrest.   Failure to wean from mechanical ventilation  HYPERTENSION  Tracheostomy dependence  Protein-calorie malnutrition, severe  Atrial flutter  COPD (chronic obstructive pulmonary disease), presumed  Electrolyte and fluid disorder  Protein calorie malnutrition  DIABETES MELLITUS, TYPE II  Anemia of other chronic disease  Hypervolemia  Poor dentition  HCAP (healthcare-associated pneumonia) -->resolved  Alcohol dependence s/p withdrawl   acute renal failure  (resolved): Baseline creatinine 0.95 from 01/05/12.   Acute respiratory failure with hypoxia, resolved  s/p PEA Cardiac arrest: Likely related to pneumonia, acidosis, hypoxic respiratory failure in setting of ETOH withdrawal  Septic shock(785.52) in setting of pneumonia (resolved)  UTI (lower urinary tract infection) resolved.     Active ASSESSMENT / PLAN: Active Problems:  Anoxic Encephalopathy: due to hypoxia after cardiac arrest.   Failure to wean from mechanical ventilation  Tracheostomy dependence  HYPERTENSION: controlled currently.   Protein-calorie malnutrition, severe  Atrial flutter: currently NSR  COPD (chronic obstructive pulmonary disease), presumed  Electrolyte and fluid disorder: currently all WNL   Protein calorie malnutrition  DIABETES MELLITUS, TYPE II: excellent glycemic control   Anemia of other chronic disease: no evidence of bleeding   Hypervolemia: improved.   Poor dentition: needs dental eval at some point   Making little progress. May or may not be able to come off vent. His level of debilitation will be the major barrier to improvement at this time.   PLAN:   Pressure support weaning attempts as tolerated nocturnal rest mandatory (PCV) for now Daily assessment need for diuresis  F/u CXR intermittently Continue PRN combivent. Continue lopressor 50 mg per tube q12h and PO dilt. Goal even to negative fluid balance Monitor renal fx, urine outpt F/u and replace electrolytes as needed   Continue tube feeds Continue Thiamine, folic acid, MVI. Prob fe deficient with ferritin falsely elevated > rx Feosol SSI and Continue lantus   Fentanyl PRN. Changed klonopin to 0.5 mg qhs on 1/03 Cont PRN ativan/xanax   [facility].    [name], MD ; [facility] [phone].  After 5:30 PM or weekends, call [phone]

## 2012-04-27 LAB — GLUCOSE, CAPILLARY
Glucose-Capillary: 127 mg/dL — ABNORMAL HIGH (ref 70–99)
Glucose-Capillary: 125 mg/dL — ABNORMAL HIGH (ref 70–99)
Glucose-Capillary: 181 mg/dL — ABNORMAL HIGH (ref 70–99)
Glucose-Capillary: 183 mg/dL — ABNORMAL HIGH (ref 70–99)

## 2012-04-27 MED ORDER — FREE WATER
200.0000 mL | Freq: Three times a day (TID) | Status: DC
  Administered 2012-04-27 – 2012-05-04 (×21): 200 mL

## 2012-04-27 MED ORDER — INSULIN GLARGINE 100 UNIT/ML ~~LOC~~ SOLN
10.0000 [IU] | Freq: Every day | SUBCUTANEOUS | Status: DC
  Administered 2012-04-27 – 2012-04-28 (×2): 10 [IU] via SUBCUTANEOUS

## 2012-04-27 MED ORDER — DILTIAZEM 12 MG/ML ORAL SUSPENSION
60.0000 mg | Freq: Three times a day (TID) | ORAL | Status: DC
  Administered 2012-04-27 – 2012-05-07 (×30): 60 mg via ORAL
  Filled 2012-04-27 (×33): qty 6

## 2012-04-27 NOTE — Progress Notes (Signed)
Physical Therapy Treatment Patient Details Name: Johnny [name] MRN: [id number] DOB: 02-28-1935 Today's Date: 04/27/2012 Time: 9562-1308 PT Time Calculation (min): 23 min  PT Assessment / Plan / Recommendation Comments on Treatment Session  Pt s/p cardiac arrest, encephalopathy, HCAP and VDRF. Pt nonverbal and does not attempt to mouth words or communicate. Pt responding at times to moving bil LE with sliding legs on bed and will grasp with bil hands. Unable to get pt to reach for rail or abduct bil shoulders with assist. Will attempt tx at least one more time to determine any potential benefit of therapy. Pt incontinent of stool on arrival and assisted with rolling and pericare. Pt with increased RR during activity up to 45 on PS/CPAP, 30% with peep 10. Dr.Simmonds present and aware.     Follow Up Recommendations  SNF     Does the patient have the potential to tolerate intense rehabilitation     Barriers to Discharge        Equipment Recommendations       Recommendations for Other Services    Frequency     Plan Discharge plan needs to be updated;Frequency remains appropriate    Precautions / Restrictions Precautions Precautions: Fall Precaution Comments: vent, peg, increased tone of bil UE   Pertinent Vitals/Pain Pt with grimace and increased resistance to movement of bil shoulders and elbows  CPOT 4    Mobility  Bed Mobility Bed Mobility: Rolling Right;Rolling Left;Scooting to HOB Rolling Right: 1: +2 Total assist Rolling Right: Patient Percentage: 10% Rolling Left: 1: +2 Total assist Rolling Left: Patient Percentage: 0% Scooting to HOB: 1: +2 Total assist Scooting to [facility]: Patient Percentage: 0% Details for Bed Mobility Assistance: pt would follow commands to slightly abduct/adduct bil LE to slide them toward EOB but unable to get pt to reach for rail with either arm due to increased tone of bil UE with pt maintaining shoulder Adduction and elbow flexion with grimace with  movement into elbow extension and shoulder flexion Transfers Transfers: Not assessed    Exercises General Exercises - Upper Extremity Shoulder Flexion: PROM;Left;5 reps;Supine Elbow Extension: PROM;Left;5 reps;Supine General Exercises - Lower Extremity Heel Slides: AAROM;PROM;10 reps;Right;Left;Supine (AAROM on RLE, PROM on LLE with increased resistance)   PT Diagnosis:    PT Problem List:   PT Treatment Interventions:     PT Goals Acute Rehab PT Goals PT Goal: Rolling Supine to Right Side - Progress: Not progressing PT Goal: Rolling Supine to Left Side - Progress: Not progressing PT Goal: Sit at Edge Of Bed - Progress: Not progressing  Visit Information  Last PT Received On: 04/27/12 Assistance Needed: +2    Subjective Data  Subjective: pt non verbal and not responding with head nods, trach on vent   Cognition  Overall Cognitive Status: Difficult to assess Area of Impairment: Following commands Difficult to assess due to: Tracheostomy Arousal/Alertness: Awake/alert Following Commands: Follows one step commands inconsistently    Balance     End of Session PT - End of Session Activity Tolerance: Other (comment) (limited by lack of participation) Patient left: in bed;with call bell/phone within reach Nurse Communication: Mobility status   GP     Delorse Lek 04/27/2012, 4:16 PM [name], PT [phone]

## 2012-04-27 NOTE — Progress Notes (Signed)
PULMONARY  / CRITICAL CARE MEDICINE  Name: Johnny [name] MRN: [id number] DOB: 1934-12-07    LOS: 26  REFERRING MD :  TRH  CHIEF COMPLAINT:  Alcohol withdrawal/HCAP >> Cardiac arrest  BRIEF PATIENT DESCRIPTION:  78 yo male former smoker admitted 04/01/2012 with shakes and incontinent of urine/feces and found to have pneumonia in setting of ETOH abuse/withdrawal.  Developed septic shock, and PEA on 12/13 with VDRF and 20 min before ROSC.  PCCM assumed care from 12/13.   LINES / TUBES: 12/12 ETT >>12/20 12/20 Tstomy (JY) >> 12/12 L IJ CVL >> 12/13 Lt radial Aline>>12/14 12/21 PEG ([facility])>>>  CULTURES: 12/12 resp >>GPC in pairs and candida albicans. 12/12 blood >>NTD 12/15 >> coag neg staph  12/12 urine >>negative 12/12 influenza >negative 12/12 urine strep >> negative 12/12 urine leg >>negative 12/25 urine>>>GNR>>>psuedomonas ss zoysn 12/25 Sputum >>> multiple morphology 12/25 B cx x2> neg   ANTIBIOTICS: 12/12 Vanc (HCAP) >>12/15>>>12/26>>>12/28 12/12 Cefepime (HCAP) >>12/15 12/12 Levaqin (HCAP) >>12/20 12/26 Zosyn>>>1/1  SIGNIFICANT EVENTS:  12/12 admission and PEA arrest 12/14 Off pressors 12/20 Trach 12/24 A flutter, cardizem gtt started  TESTS: 12/12 CT head >> chronic microvascular ischemic changes 12/13 EEG >> moderate-to-severe generalized continuous slowing of cerebral activity 12/13 Echo >> EF 50 to 55% (tested while on dobutamine) 12/13 Abd u/s >> hepatic steatosis, medical renal disease, gallbladder sludge with small stones  LEVEL OF CARE:  SDU PRIMARY SERVICE:  PCCM CONSULTANTS:  None CODE STATUS: full  DIET:  Tube feeds DVT Px:  SQ heparin  GI Px:  Pantoprazole  INTERVAL HISTORY:  Tolerates PSV weaing  VITAL SIGNS: Temp:  [98.1 F (36.7 C)-99.1 F (37.3 C)] 98.5 F (36.9 C) (01/07 1935) Pulse Rate:  [87-104] 100  (01/07 1935) Resp:  [13-34] 28  (01/07 1935) BP: (104-167)/(32-63) 148/63 mmHg (01/07 1935) SpO2:  [99 %-100 %] 100 % (01/07  1935) FiO2 (%):  [30 %] 30 % (01/07 1925) Weight:  [73 kg (160 lb 15 oz)] 73 kg (160 lb 15 oz) (01/07 0500) VENTILATOR SETTINGS: Vent Mode:  [-] PCV FiO2 (%):  [30 %] 30 % Set Rate:  [18 bmp] 18 bmp PEEP:  [5 cmH20] 5 cmH20 Pressure Support:  [10 cmH20-15 cmH20] 10 cmH20 Plateau Pressure:  [17 cmH20-22 cmH20] 21 cmH20   PHYSICAL EXAMINATION: Gen: RASS -2 HEENT: Trach site clean PULM: no wheeze CV: RRR s M AB: decreased bowel sounds, soft, non tender, PEG site clean.  Ext: no edema. Derm: no rashes Neuro:  No focal deficits  LABS: CBC  Lab 04/24/12 0400 04/23/12 0555 04/21/12 0500  WBC 21.8* -- --  HGB 8.2* 8.1* 8.1*  HCT 24.8* 24.4* 24.4*  PLT 439* 426* 415*   Chemistry  Lab 04/24/12 0400 04/23/12 0555 04/21/12 0500  NA 142 142 137  K 3.6 3.5 3.5  CL 108 105 98  CO2 22 24 23   BUN 54* 57* 46*  CREATININE 1.17 1.25 1.59*  CALCIUM 9.2 9.1 8.7  MG -- -- 1.8  PHOS -- -- 3.6  GLUCOSE 220* 173* 196*   CBG trend  Lab 04/27/12 1626 04/27/12 1245 04/27/12 0857 04/27/12 0522 04/27/12 0036  GLUCAP 181* 125* 135* 127* 197*    Intake/Output Summary (Last 24 hours) at 04/27/12 1945 Last data filed at 04/27/12 1900  Gross per 24 hour  Intake   2120 ml  Output    425 ml  Net   1695 ml   IMAGING:  No results found.  Active ASSESSMENT / PLAN: Active Problems:  Anoxic Encephalopathy: due to hypoxia after cardiac arrest.   Failure to wean from mechanical ventilation  Tracheostomy dependence  HYPERTENSION: controlled currently.   Protein-calorie malnutrition, severe  Atrial flutter: currently NSR  COPD (chronic obstructive pulmonary disease), presumed  Electrolyte and fluid disorder: currently all WNL   Protein calorie malnutrition  DIABETES MELLITUS, TYPE II: excellent glycemic control   Anemia of other chronic disease: no evidence of bleeding   Hypervolemia: improved.   Poor dentition: needs dental eval at some point   Making little progress. May or may  not be able to come off vent. His level of debilitation will be the major barrier to improvement at this time.   PLAN:   Cont pressure support weaning attempts as tolerated F/u CXR and labs intermittently Cont current med rx Goal even to negative fluid balance F/u and replace electrolytes as needed   Continue tube feeds Cont SSI and Continue lantus   Decrease sedation as tolrated  [facility].    [name], MD ; [facility] [phone].  After 5:30 PM or weekends, call [phone]

## 2012-04-27 NOTE — Progress Notes (Signed)
Trach Team Patient Details Name: Johnny [name] MRN: [id number] DOB: Nov 07, 1934 Today's Date: 04/27/2012 Time:  -    SLP reviewed chart as part of trach team for possible appropriateness for Passy-Muir speaking valve.  Pt. Continues on vent support.  Do we suspect pt. will need long term vent?  Is he following commands, attempting to communicate (mouthing words)?  Would he benefit from PMSV eval on vent?     Breck Coons [place] [phone]  04/27/2012

## 2012-04-28 LAB — GLUCOSE, CAPILLARY
Glucose-Capillary: 134 mg/dL — ABNORMAL HIGH (ref 70–99)
Glucose-Capillary: 84 mg/dL (ref 70–99)

## 2012-04-28 MED ORDER — FENTANYL CITRATE 0.05 MG/ML IJ SOLN
25.0000 ug | INTRAMUSCULAR | Status: DC | PRN
  Administered 2012-04-28 – 2012-05-06 (×21): 25 ug via INTRAVENOUS
  Filled 2012-04-28 (×21): qty 2

## 2012-04-28 MED ORDER — FENTANYL 25 MCG/HR TD PT72
25.0000 ug | MEDICATED_PATCH | TRANSDERMAL | Status: DC
  Administered 2012-04-28 – 2012-05-04 (×3): 25 ug via TRANSDERMAL
  Filled 2012-04-28 (×4): qty 1

## 2012-04-28 MED ORDER — JEVITY 1.2 CAL PO LIQD
1000.0000 mL | ORAL | Status: DC
  Administered 2012-04-29 – 2012-05-01 (×2): 1000 mL
  Administered 2012-05-02: 06:00:00
  Administered 2012-05-03: 1000 mL
  Filled 2012-04-28 (×5): qty 1000

## 2012-04-28 NOTE — Progress Notes (Signed)
PULMONARY  / CRITICAL CARE MEDICINE  Name: Johnny [name] MRN: [id number] DOB: 22-Feb-1935    LOS: 27  REFERRING MD :  TRH  CHIEF COMPLAINT:  Alcohol withdrawal/HCAP >> Cardiac arrest  BRIEF PATIENT DESCRIPTION:  78 yo male former smoker admitted 04/01/2012 with shakes and incontinent of urine/feces and found to have pneumonia in setting of ETOH abuse/withdrawal.  Developed septic shock, and PEA on 12/13 with VDRF and 20 min before ROSC.  PCCM assumed care from 12/13.   LINES / TUBES: 12/12 ETT >>12/20 12/12 L IJ CVL >> 12/20 12/13 Lt radial Aline>>12/14 12/20 Trach >> 12/21 PEG ([facility]) >>  12/20 RUE PICC >>   CULTURES: 12/12 resp >>GPC in pairs and candida albicans. 12/12 blood >>NTD 12/15 >> coag neg staph  12/12 urine >>negative 12/12 influenza >negative 12/12 urine strep >> negative 12/12 urine leg >>negative 12/25 urine>>>GNR>>>psuedomonas ss zoysn 12/25 Sputum >>> multiple morphology 12/25 B cx x2> neg   ANTIBIOTICS: 12/12 Vanc (HCAP) >>12/15>>>12/26>>>12/28 12/12 Cefepime (HCAP) >>12/15 12/12 Levaqin (HCAP) >>12/20 12/26 Zosyn>>>1/1  SIGNIFICANT EVENTS:  12/12 admission and PEA arrest 12/14 Off pressors 12/20 Trach 12/24 A flutter, cardizem gtt started  TESTS: 12/12 CT head >> chronic microvascular ischemic changes 12/13 EEG >> moderate-to-severe generalized continuous slowing of cerebral activity 12/13 Echo >> EF 50 to 55% (tested while on dobutamine) 12/13 Abd u/s >> hepatic steatosis, medical renal disease, gallbladder sludge with small stones  LEVEL OF CARE:  SDU PRIMARY SERVICE:  PCCM CONSULTANTS:  None CODE STATUS: full  DIET:  Tube feeds DVT Px:  SQ heparin  GI Px:  Pantoprazole  INTERVAL HISTORY:  RASS +1, not tolerating attempts @ PSV  VITAL SIGNS: Temp:  [98.2 F (36.8 C)-99.8 F (37.7 C)] 99.3 F (37.4 C) (01/08 0746) Pulse Rate:  [86-108] 87  (01/08 0746) Resp:  [13-34] 19  (01/08 0746) BP: (104-168)/(44-84) 159/50 mmHg (01/08  0746) SpO2:  [99 %-100 %] 99 % (01/08 0746) FiO2 (%):  [30 %] 30 % (01/08 1005) VENTILATOR SETTINGS: Vent Mode:  [-] PCV FiO2 (%):  [30 %] 30 % Set Rate:  [18 bmp] 18 bmp PEEP:  [5 cmH20] 5 cmH20 Pressure Support:  [10 cmH20-15 cmH20] 10 cmH20 Plateau Pressure:  [21 cmH20-26 cmH20] 22 cmH20   PHYSICAL EXAMINATION: Gen: RASS +1 HEENT: Trach site clean PULM: no wheeze CV: RRR s M AB: decreased bowel sounds, soft, non tender, PEG site clean.  Ext: no edema. Derm: no rashes Neuro:  No focal deficits, increased tone in BUEs  LABS: CBC  Lab 04/24/12 0400 04/23/12 0555  WBC 21.8* --  HGB 8.2* 8.1*  HCT 24.8* 24.4*  PLT 439* 426*   Chemistry  Lab 04/24/12 0400 04/23/12 0555  NA 142 142  K 3.6 3.5  CL 108 105  CO2 22 24  BUN 54* 57*  CREATININE 1.17 1.25  CALCIUM 9.2 9.1  MG -- --  PHOS -- --  GLUCOSE 220* 173*   CBG trend  Lab 04/28/12 0747 04/28/12 0411 04/27/12 2347 04/27/12 1940 04/27/12 1626  GLUCAP 84 142* 134* 183* 181*    Intake/Output Summary (Last 24 hours) at 04/28/12 1112 Last data filed at 04/28/12 0900  Gross per 24 hour  Intake   1810 ml  Output    675 ml  Net   1135 ml   IMAGING:  No results found.     Active ASSESSMENT / PLAN: Active Problems:  Anoxic Encephalopathy: due to hypoxia after cardiac arrest.   Failure  to wean from mechanical ventilation  Tracheostomy dependence  HYPERTENSION: controlled currently.   Protein-calorie malnutrition, severe  Atrial flutter: currently NSR  COPD (chronic obstructive pulmonary disease), presumed  Electrolyte and fluid disorder: currently all WNL   Protein calorie malnutrition  DIABETES MELLITUS, TYPE II: excellent glycemic control   Anemia of other chronic disease: no evidence of bleeding   Hypervolemia: improved.   Poor dentition: needs dental eval at some point   Making little progress. May or may not be able to come off vent. His level of debilitation will be the major barrier to  improvement at this time.   PLAN:   Cont pressure support weaning attempts as tolerated F/u CXR and labs intermittently Cont current med rx Add low dose Duragesic patch F/u and replace electrolytes as needed   Continue tube feeds Cont SSI and Continue lantus  - Lantus increased 1/07    [facility].    [name], MD ; [facility] [phone].  After 5:30 PM or weekends, call [phone]

## 2012-04-28 NOTE — Progress Notes (Signed)
eLink Physician-Brief Progress Note Patient Name: Johnny [name] DOB: May 19, 1934 MRN: [id number]  Date of Service  04/28/2012   HPI/Events of Note   Emesis with TF  eICU Interventions  Rate reduced to 30/hr   Intervention Category Major Interventions:  (emesis)  Shan Levans 04/28/2012, 9:20 PM

## 2012-04-28 NOTE — Procedures (Signed)
Tracheostomy Change Note  Patient Details:   Name: Johnny [name] DOB: 1935-02-26 MRN: [id number]    Airway Documentation:  Changed # 8.0  Cuffed shiley to same size per protocol.   Evaluation  O2 sats: stable throughout and currently acceptable Complications: No apparent complications Patient did tolerate procedure well. Bilateral Breath Sounds: Diminished Suctioning: Airway  Christie Beckers 04/28/2012, 12:10 PM

## 2012-04-28 NOTE — Progress Notes (Signed)
Pt throwing up tube feed multiple times, no residual from PEG tube, antiemetic given, and Dr. Delford Field notified. Temp 100.7 and PRN given.

## 2012-04-28 NOTE — Progress Notes (Signed)
Clinical Social Worker submitted updated clinical information to [facility].    Angelia Mould, MSW, [place] [phone]

## 2012-04-28 NOTE — Progress Notes (Signed)
Clinical Social Worker received notification that Johnny [name] has denied him for LTAC benefits.  CSW spoke with Johnny [name]'s family and explained [facility] SNF will now be pursued.  CSW explained that [facility] has one male bed available and they are currently reviewing his information.  CSW explained that if [facility] does not accept Johnny [name], Johnny [name] and family will need to pursue [facility].      Angelia Mould, MSW, [place] [phone]

## 2012-04-29 ENCOUNTER — Inpatient Hospital Stay (HOSPITAL_COMMUNITY): Payer: Medicare PPO

## 2012-04-29 LAB — GLUCOSE, CAPILLARY
Glucose-Capillary: 60 mg/dL — ABNORMAL LOW (ref 70–99)
Glucose-Capillary: 100 mg/dL — ABNORMAL HIGH (ref 70–99)
Glucose-Capillary: 98 mg/dL (ref 70–99)
Glucose-Capillary: 139 mg/dL — ABNORMAL HIGH (ref 70–99)

## 2012-04-29 LAB — CBC
HCT: 21.2 % — ABNORMAL LOW (ref 39.0–52.0)
Hemoglobin: 6.9 g/dL — CL (ref 13.0–17.0)
MCV: 94.6 fL (ref 78.0–100.0)
MCV: 94.9 fL (ref 78.0–100.0)
Platelets: 293 10*3/uL (ref 150–400)
RBC: 2.36 MIL/uL — ABNORMAL LOW (ref 4.22–5.81)
RDW: 15.2 % (ref 11.5–15.5)
WBC: 9.6 10*3/uL (ref 4.0–10.5)
WBC: 10.9 10*3/uL — ABNORMAL HIGH (ref 4.0–10.5)

## 2012-04-29 LAB — BASIC METABOLIC PANEL
BUN: 36 mg/dL — ABNORMAL HIGH (ref 6–23)
CO2: 21 mEq/L (ref 19–32)
Chloride: 113 mEq/L — ABNORMAL HIGH (ref 96–112)
Creatinine, Ser: 0.9 mg/dL (ref 0.50–1.35)

## 2012-04-29 MED ORDER — DEXTROSE 50 % IV SOLN: 25.0000 mL | Freq: Once | INTRAVENOUS | Status: DC | PRN

## 2012-04-29 MED ORDER — POTASSIUM CHLORIDE 20 MEQ/15ML (10%) PO LIQD
60.0000 meq | Freq: Once | ORAL | Status: AC
  Administered 2012-04-29: 60 meq
  Filled 2012-04-29: qty 45

## 2012-04-29 MED ORDER — DEXTROSE 50 % IV SOLN
INTRAVENOUS | Status: AC
  Administered 2012-04-29: 25 mL
  Filled 2012-04-29: qty 50

## 2012-04-29 MED ORDER — INSULIN ASPART 100 UNIT/ML ~~LOC~~ SOLN
2.0000 [IU] | SUBCUTANEOUS | Status: DC
  Administered 2012-04-29 (×2): 2 [IU] via SUBCUTANEOUS

## 2012-04-29 NOTE — Progress Notes (Signed)
Inpatient Diabetes Program Recommendations  AACE/ADA: New Consensus Statement on Inpatient Glycemic Control (2013)  Target Ranges:  Prepandial:   less than 140 mg/dL      Peak postprandial:   less than 180 mg/dL (1-2 hours)      Critically ill patients:  140 - 180 mg/dL   HYPOglycemia this morning CBG=57. Tube feeding rate has been decreased from 55/h to 30/h.   Discontinue Lantus. Recommend holding tube feed coverage for now and DECREASE correction scale to SENSITIVE.   Will follow. Thank you  Piedad Climes BSN, RN,CDE Inpatient Diabetes Coordinator [phone] (team pager)

## 2012-04-29 NOTE — Progress Notes (Deleted)
CRITICAL VALUE ALERT  Critical value received:  CBG 60  Date of notification:  04/29/2012   Time of notification: 0445  Critical value read back:yes  Nurse who received alert:  Lillia Corporal   MD notified (1st page):  Dr. Darrick Penna  Time of first page:  0515  MD notified (2nd page):  Time of second page:  Responding MD:  Dr. Darrick Penna  Time MD responded:  [phone]

## 2012-04-29 NOTE — Progress Notes (Signed)
With regard to the issue of alcohol withdrawal, Johnny [name] presently exhibits no evidence of active withdrawal symptoms and has not exhibited any aggressive or violent behavior suggestive of EtOH withdrawal during this hospitalization   [name], MD ; [facility] service Mobile [phone].  After 5:30 PM or weekends, call [phone]

## 2012-04-29 NOTE — Progress Notes (Signed)
CRITICAL VALUE ALERT  Critical value received:  Hgb 6.9  Date of notification:  04/29/2012  Time of notification:  0620  Critical value read back:yes  Nurse who received alert:  Lillia Corporal   MD notified (1st page):  Dr. Darrick Penna  Time of first page:  [phone]  MD notified (2nd page): Dr. Darrick Penna  Time of second page: [phone]  Responding MD:    Time MD responded:

## 2012-04-29 NOTE — Progress Notes (Signed)
eLink Physician-Brief Progress Note Patient Name: Johnny [name] DOB: 1935/03/02 MRN: [id number]  Date of Service  04/29/2012   HPI/Events of Note   Hypokalemia  eICU Interventions  Potassium replaced   Intervention Category Intermediate Interventions: Electrolyte abnormality - evaluation and management  DETERDING,ELIZABETH 04/29/2012, 6:17 AM

## 2012-04-29 NOTE — Progress Notes (Signed)
PULMONARY  / CRITICAL CARE MEDICINE  Name: SIGIFREDO PIGNATO MRN: [id number] DOB: [birth year]    LOS: 28  REFERRING MD :  TRH  CHIEF COMPLAINT:  Alcohol withdrawal/HCAP >> Cardiac arrest  BRIEF PATIENT DESCRIPTION:  78 yo male former smoker admitted 04/01/2012 with shakes and incontinent of urine/feces and found to have pneumonia in setting of ETOH abuse/withdrawal.  Developed septic shock, and PEA on 12/13 with VDRF and 20 min before ROSC.  PCCM assumed care from 12/13.   LINES / TUBES: 12/12 ETT >>12/20 12/12 L IJ CVL >> 12/20 12/13 Lt radial Aline>>12/14 12/20 Trach >> 12/21 PEG ([facility]) >>  12/20 RUE PICC >>   CULTURES: 12/12 resp >>GPC in pairs and candida albicans. 12/12 blood >>NTD 12/15 >> coag neg staph  12/12 urine >>negative 12/12 influenza >negative 12/12 urine strep >> negative 12/12 urine leg >>negative 12/25 urine>>>GNR>>>psuedomonas ss zoysn 12/25 Sputum >>> multiple morphology 12/25 B cx x2> neg   ANTIBIOTICS: 12/12 Vanc (HCAP) >>12/15>>>12/26>>>12/28 12/12 Cefepime (HCAP) >>12/15 12/12 Levaqin (HCAP) >>12/20 12/26 Zosyn>>>1/1  SIGNIFICANT EVENTS:  12/12 admission and PEA arrest 12/14 Off pressors 12/20 Trach 12/24 A flutter, cardizem gtt started  TESTS: 12/12 CT head >> chronic microvascular ischemic changes 12/13 EEG >> moderate-to-severe generalized continuous slowing of cerebral activity 12/13 Echo >> EF 50 to 55% (tested while on dobutamine) 12/13 Abd u/s >> hepatic steatosis, medical renal disease, gallbladder sludge with small stones  LEVEL OF CARE:  SDU PRIMARY SERVICE:  PCCM CONSULTANTS:  None CODE STATUS: full  DIET:  Tube feeds DVT Px:  SQ heparin  GI Px:  Pantoprazole  INTERVAL HISTORY Episode of hypoglycemia this AM Hypokalemia this AM > repleted Tolerating PSV 10 cm H2O  VITAL SIGNS: Temp:  [97.7 F (36.5 C)-99 F (37.2 C)] 98.4 F (36.9 C) (01/09 2000) Pulse Rate:  [72-100] 91  (01/09 2042) Resp:  [16-30] 16  (01/09  2042) BP: (112-158)/(32-82) 148/34 mmHg (01/09 2042) SpO2:  [100 %] 100 % (01/09 2042) FiO2 (%):  [30 %] 30 % (01/09 2042) VENTILATOR SETTINGS: Vent Mode:  [-] PCV FiO2 (%):  [30 %] 30 % Set Rate:  [16 bmp] 16 bmp PEEP:  [5 cmH20] 5 cmH20 Pressure Support:  [5 cmH20-10 cmH20] 10 cmH20 Plateau Pressure:  [16 cmH20-24 cmH20] 18 cmH20   PHYSICAL EXAMINATION: Gen: RASS 0 HEENT: Trach site clean PULM: no wheeze CV: RRR s M AB: decreased bowel sounds, soft, non tender, PEG site clean.  Ext: no edema. Derm: no rashes Neuro:  No focal deficits, increased tone in BUEs  LABS: CBC  Lab 04/29/12 0820 04/29/12 0518 04/24/12 0400  WBC 10.9* -- --  HGB 7.3* 6.9* 8.2*  HCT 22.4* 21.2* 24.8*  PLT 293 303 439*   Chemistry  Lab 04/29/12 0518 04/24/12 0400 04/23/12 0555  NA 144 142 142  K 3.0* 3.6 3.5  CL 113* 108 105  CO2 21 22 24   BUN 36* 54* 57*  CREATININE 0.90 1.17 1.25  CALCIUM 8.8 9.2 9.1  MG -- -- --  PHOS -- -- --  GLUCOSE 105* 220* 173*   CBG trend  Lab 04/29/12 1951 04/29/12 1616 04/29/12 1130 04/29/12 0844 04/29/12 0522  GLUCAP 139* 102* 97 98 100*    Intake/Output Summary (Last 24 hours) at 04/29/12 2049 Last data filed at 04/29/12 2000  Gross per 24 hour  Intake   1160 ml  Output    975 ml  Net    185 ml   IMAGING:  Dg Chest Port 1 View  04/29/2012  *RADIOLOGY REPORT*  Clinical Data: Follow up respiratory failure  PORTABLE CHEST - 1 VIEW  Comparison: 04/24/2012  Findings: Mild patchy right basilar opacity, likely atelectasis. No pleural effusion or pneumothorax.  The heart is normal in size. Postsurgical changes related to prior CABG.  Tracheostomy terminates at the thoracic inlet.  Stable right arm PICC.  Surgical clips and metallic pellets overlying the upper abdomen.  IMPRESSION: Mild patchy right basilar opacity, likely atelectasis.  Stable support apparatus as above.   Original Report Authenticated By: Charline Bills, M.D.        Active ASSESSMENT /  PLAN: Active Problems:  Anoxic Encephalopathy: due to hypoxia after cardiac arrest.   Failure to wean from mechanical ventilation  Tracheostomy dependence  HYPERTENSION: controlled currently.   Protein-calorie malnutrition, severe  Atrial flutter: currently NSR  COPD (chronic obstructive pulmonary disease), presumed  Electrolyte and fluid disorder: currently all WNL   Protein calorie malnutrition  DIABETES MELLITUS, TYPE II: excellent glycemic control   Anemia of other chronic disease: no evidence of bleeding   Hypervolemia: improved.   Poor dentition: needs dental eval at some point   Episode of hypoglycemia 1/09  PLAN:   Cont pressure support weaning as tolerated F/u CXR and labs intermittently Cont current med rx Cont low dose Duragesic patch Replace electrolytes as needed   Continue tube feeds D/C Lantus and cont SSI    Awaiting [facility] placement.    [name], MD ; [facility] [phone].  After 5:30 PM or weekends, call [phone]

## 2012-04-29 NOTE — Progress Notes (Signed)
NUTRITION FOLLOW UP  Intervention:    Recommend increase TF by 10 ml every 4 hours as tolerated back to goal rate of 55 ml/hr of Jevity 1.2.  Goal TF regimen will provide: 1884 kcal, 118 grams protein, 1065 ml free water   Nutrition Dx:   Inadequate oral intake related to inability to eat as evidenced by NPO. Ongoing.  Goal:   EN to meet >/=90% estimated nutrition needs. Unmet  Monitor:   Vent status, weight trends, I/O's, TF tolerance/adequacy  Assessment:   Trach placed 12/20. PEG 12/24. Pt awaiting possible placement at [facility].  Noted pt vomited TF, TF stopped then resumed @ 30 ml/hr. Per pt's RN vomiting occurred over night and has not had any vomiting since. TF running at 30 ml/hr. Pt continues to have no residuals and having bowel movements.   Currently receiving Jevity 1.2 at 30 ml/hr. 30 ml Prostat via tube TID. This provides 1164 kcal, 85 grams protein, 583 ml free water.   Free Water Flushes: 200 ml every 8 hours providing: 600 ml  Total free water: 1183 ml  Residuals: 0  Last bm: 1/8    Patient is currently intubated on ventilator support after code blue on unit. Pt with PEA arrest. Pt with hx of ETOH abuse.   MV: 13.7 Temp:Temp (24hrs), Avg:98.9 F (37.2 C), Min:97.7 F (36.5 C), Max:100.7 F (38.2 C)   Height: Ht Readings from Last 1 Encounters:  04/01/12 5' 10.5" (1.791 m)    Weight Status:   Wt Readings from Last 1 Encounters:  04/27/12 160 lb 15 oz (73 kg)    Re-estimated needs:  Kcal: 1850 Protein: 115-130 gm  Fluid: per team  Skin: stage III at trach site; skin tear to sacrum  Diet Order:  NPO   Intake/Output Summary (Last 24 hours) at 04/29/12 0847 Last data filed at 04/29/12 0800  Gross per 24 hour  Intake   1830 ml  Output   1200 ml  Net    630 ml   Labs:   Lab 04/29/12 0518 04/24/12 0400 04/23/12 0555  NA 144 142 142  K 3.0* 3.6 3.5  CL 113* 108 105  CO2 21 22 24   BUN 36* 54* 57*  CREATININE 0.90 1.17 1.25  CALCIUM  8.8 9.2 9.1  MG -- -- --  PHOS -- -- --  GLUCOSE 105* 220* 173*    CBG (last 3)   Basename 04/29/12 0522 04/29/12 0422 04/29/12 0419  GLUCAP 100* 57* 60*    Scheduled Meds:    . antiseptic oral rinse  15 mL Mouth Rinse QID  . chlorhexidine  15 mL Mouth Rinse BID  . diltiazem  60 mg Oral Q8H  . feeding supplement  30 mL Per Tube TID WC  . fentaNYL  25 mcg Transdermal Q72H  . ferrous sulfate  220 mg Oral TID WC  . free water  200 mL Per Tube Q8H  . heparin subcutaneous  5,000 Units Subcutaneous Q8H  . insulin aspart  0-15 Units Subcutaneous Q4H  . insulin aspart  2 Units Subcutaneous Q4H  . insulin glargine  10 Units Subcutaneous QHS  . metoprolol tartrate  50 mg Per Tube BID  . pantoprazole sodium  40 mg Per Tube Q24H  . sodium chloride  10-40 mL Intracatheter Q12H    Continuous Infusions:    . sodium chloride 20 mL/hr at 04/29/12 0537  . dextrose 40 mL/hr at 04/16/12 0700  . feeding supplement (JEVITY 1.2 CAL) 1,000 mL (04/29/12  1610)   Kendell Bane RD, LDN, CNSC [phone] Pager [phone] After Hours Pager

## 2012-04-29 NOTE — Progress Notes (Signed)
Pt placed back on vent due to no pt effort

## 2012-04-29 NOTE — Progress Notes (Signed)
Clinical Social Worker staffed case with MD and RN.  CSW provided updated clinical information to [facility].  CSW to continue to follow and assist as needed.  Angelia Mould, MSW, [place] [phone]

## 2012-04-29 NOTE — Progress Notes (Signed)
Hypoglycemic Event  CBG: 60  Treatment: D50 IV 25 mL  Symptoms: None  Follow-up CBG: Time:0510 CBG Result:100  Possible Reasons for Event: Other: Decrease in tube feeding  Comments/MD notified:Dr. Deterding    Johnny [name]  Remember to initiate Hypoglycemia Order Set & complete

## 2012-04-30 DIAGNOSIS — D638 Anemia in other chronic diseases classified elsewhere: Secondary | ICD-10-CM

## 2012-04-30 LAB — GLUCOSE, CAPILLARY
Glucose-Capillary: 82 mg/dL (ref 70–99)
Glucose-Capillary: 85 mg/dL (ref 70–99)

## 2012-04-30 MED ORDER — POTASSIUM CHLORIDE 20 MEQ/15ML (10%) PO LIQD
40.0000 meq | Freq: Two times a day (BID) | ORAL | Status: AC
  Administered 2012-04-30 (×2): 40 meq
  Filled 2012-04-30 (×2): qty 30

## 2012-04-30 NOTE — Progress Notes (Signed)
Physical Therapy Treatment Patient Details Name: Johnny [name] MRN: [id number] DOB: [birth year] Today's Date: 04/30/2012 Time: 4098-1191 PT Time Calculation (min): 29 min  PT Assessment / Plan / Recommendation Comments on Treatment Session  Pt s/p cardiac arrest, HCAP, VDRF, encephalopathy. Pt greatly improved today able to follow all one step commands, moving all extremities on command without tone or rigidity of bil UE as noted in prior sessions although still limited with full elbow extension LUE and limited shoulder flexion bil UE. Pt able to stick out tongue as well as open and close eyes on command and nodding appropriately. Given pt improved mobility and cognitive state will increase frequency and recommend PRAFO for prevention of foot drop as well as OT consult . Will continue to follow. On arrival pt with frothy sputum on chin and neck with small amounts of what apear to be tube feed, RN and RT notified and RN turned off tube feed.     Follow Up Recommendations  LTACH     Does the patient have the potential to tolerate intense rehabilitation     Barriers to Discharge        Equipment Recommendations       Recommendations for Other Services OT consult  Frequency Min 3X/week   Plan Discharge plan needs to be updated;Frequency needs to be updated    Precautions / Restrictions Precautions Precautions: Fall Precaution Comments: vent, peg   Pertinent Vitals/Pain CPOT= 0 On pressure control FIo2 30%, peep 5 RR 20-30 during activity sats 100%    Mobility  Bed Mobility Bed Mobility: Not assessed Transfers Transfers: Not assessed    Exercises General Exercises - Upper Extremity Shoulder Flexion: AAROM;Both;10 reps;Supine (AAROM for end range only able to achieve 90degrees) Elbow Flexion: AAROM;Both;10 reps;Supine (AAROM for end range only) Elbow Extension: AAROM;Both;10 reps;Supine (AAROM for end range only) General Exercises - Lower Extremity Ankle Circles/Pumps:  AROM;Right;10 reps;Supine;PROM (PROM on LLE x 3 stretching) Short Arc Quad: AROM;Both;15 reps;Supine Heel Slides: AROM;Both;15 reps;Supine Hip ABduction/ADduction: AROM;Both;10 reps;Supine   PT Diagnosis:    PT Problem List:   PT Treatment Interventions:     PT Goals Acute Rehab PT Goals Pt will Perform Home Exercise Program: Independently PT Goal: Perform Home Exercise Program - Progress: Goal set today  Visit Information  Last PT Received On: 04/30/12 Assistance Needed: +2    Subjective Data  Subjective: pt nodding appropriately today   Cognition  Overall Cognitive Status: Difficult to assess Difficult to assess due to: Tracheostomy Behavior During Session: [facility] for tasks performed Following Commands: Follows one step commands consistently    Balance     End of Session PT - End of Session Activity Tolerance: Patient tolerated treatment well Patient left: in bed;with call bell/phone within reach;with nursing in room Nurse Communication: Mobility status   GP     Johnny [name] [name] 04/30/2012, 8:43 AM [name], PT [phone]

## 2012-04-30 NOTE — Progress Notes (Signed)
PULMONARY  / CRITICAL CARE MEDICINE  Name: Johnny [name] MRN: [id number] DOB: 1934/07/28    LOS: 29  REFERRING MD :  TRH  CHIEF COMPLAINT:  Alcohol withdrawal/HCAP >> Cardiac arrest  BRIEF PATIENT DESCRIPTION:  78 yo male former smoker admitted 04/01/2012 with shakes and incontinent of urine/feces and found to have pneumonia in setting of ETOH abuse/withdrawal.  Developed septic shock, and PEA on 12/13 with VDRF and 20 min before ROSC.  PCCM assumed care from 12/13.   LINES / TUBES: 12/12 ETT >>12/20 12/12 L IJ CVL >> 12/20 12/13 Lt radial Aline>>12/14 12/20 Trach >> 12/21 PEG ([facility]) >>  12/20 RUE PICC >>   CULTURES: 12/12 resp >>GPC in pairs and candida albicans. 12/12 blood >>NTD 12/15 >> coag neg staph  12/12 urine >>negative 12/12 influenza >negative 12/12 urine strep >> negative 12/12 urine leg >>negative 12/25 urine>>>GNR>>>psuedomonas ss zoysn 12/25 Sputum >>> multiple morphology 12/25 B cx x2> neg   ANTIBIOTICS: 12/12 Vanc (HCAP) >>12/15>>>12/26>>>12/28 12/12 Cefepime (HCAP) >>12/15 12/12 Levaqin (HCAP) >>12/20 12/26 Zosyn>>>1/1  SIGNIFICANT EVENTS:  12/12 admission and PEA arrest 12/14 Off pressors 12/20 Trach 12/24 A flutter, cardizem gtt started  TESTS: 12/12 CT head >> chronic microvascular ischemic changes 12/13 EEG >> moderate-to-severe generalized continuous slowing of cerebral activity 12/13 Echo >> EF 50 to 55% (tested while on dobutamine) 12/13 Abd u/s >> hepatic steatosis, medical renal disease, gallbladder sludge with small stones  LEVEL OF CARE:  SDU PRIMARY SERVICE:  PCCM CONSULTANTS:  None CODE STATUS: full  DIET:  Tube feeds DVT Px:  SQ heparin  GI Px:  Pantoprazole  INTERVAL HISTORY Very alert. Interactive. + F/C. No distress on full support  VITAL SIGNS: Temp:  [97.9 F (36.6 C)-98.6 F (37 C)] 98.4 F (36.9 C) (01/10 0740) Pulse Rate:  [73-117] 116  (01/10 1155) Resp:  [16-35] 27  (01/10 1155) BP: (113-167)/(33-103)  132/80 mmHg (01/10 1155) SpO2:  [100 %] 100 % (01/10 0740) FiO2 (%):  [30 %] 30 % (01/10 1155) Weight:  [72.9 kg (160 lb 11.5 oz)] 72.9 kg (160 lb 11.5 oz) (01/10 0500) VENTILATOR SETTINGS: Vent Mode:  [-] PCV FiO2 (%):  [30 %] 30 % Set Rate:  [16 bmp] 16 bmp PEEP:  [5 cmH20] 5 cmH20 Pressure Support:  [10 cmH20] 10 cmH20 Plateau Pressure:  [18 cmH20-24 cmH20] 23 cmH20   PHYSICAL EXAMINATION: Gen: RASS 0 HEENT: Trach site clean PULM: no wheeze CV: RRR s M AB: decreased bowel sounds, soft, non tender, PEG site clean.  Ext: no edema. Derm: no rashes Neuro: increased tone in BUEs  LABS: CBC  Lab 04/29/12 0820 04/29/12 0518 04/24/12 0400  WBC 10.9* -- --  HGB 7.3* 6.9* 8.2*  HCT 22.4* 21.2* 24.8*  PLT 293 303 439*   Chemistry  Lab 04/29/12 0518 04/24/12 0400  NA 144 142  K 3.0* 3.6  CL 113* 108  CO2 21 22  BUN 36* 54*  CREATININE 0.90 1.17  CALCIUM 8.8 9.2  MG -- --  PHOS -- --  GLUCOSE 105* 220*   CBG trend  Lab 04/30/12 1159 04/30/12 0742 04/30/12 0333 04/30/12 0009 04/29/12 1951  GLUCAP 96 85 82 143* 139*    Intake/Output Summary (Last 24 hours) at 04/30/12 1249 Last data filed at 04/30/12 0600  Gross per 24 hour  Intake    900 ml  Output    375 ml  Net    525 ml   IMAGING:  Dg Chest [facility] [address]  04/29/2012  *RADIOLOGY REPORT*  Clinical Data: Follow up respiratory failure  PORTABLE CHEST - 1 VIEW  Comparison: 04/24/2012  Findings: Mild patchy right basilar opacity, likely atelectasis. No pleural effusion or pneumothorax.  The heart is normal in size. Postsurgical changes related to prior CABG.  Tracheostomy terminates at the thoracic inlet.  Stable right arm PICC.  Surgical clips and metallic pellets overlying the upper abdomen.  IMPRESSION: Mild patchy right basilar opacity, likely atelectasis.  Stable support apparatus as above.   Original Report Authenticated By: Charline Bills, M.D.        Active ASSESSMENT / PLAN: Active Problems:  Anoxic  Encephalopathy: due to hypoxia after cardiac arrest.   Failure to wean from mechanical ventilation  Tracheostomy dependence  HYPERTENSION: controlled currently.   Protein-calorie malnutrition, severe  Atrial flutter: currently NSR  COPD (chronic obstructive pulmonary disease), presumed  Electrolyte and fluid disorder: currently all WNL   Protein calorie malnutrition  DIABETES MELLITUS, TYPE II: excellent glycemic control   Anemia of other chronic disease: no evidence of bleeding   Hypervolemia: improved.   Poor dentition: needs dental eval at some point   Episode of hypoglycemia 1/09  PLAN:   Cont pressure support weaning as tolerated F/u CXR and labs intermittently Cont current med rx Cont low dose Duragesic patch Replace electrolytes as needed   Continue tube feeds Cont SSI    Awaiting [facility] placement.    Johnny Fischer, MD ; [facility] [phone].  After 5:30 PM or weekends, call [phone]

## 2012-04-30 NOTE — Progress Notes (Signed)
Clinical Child psychotherapist provided updated clinicals to [facility] and [employer].    Angelia Mould, MSW, [place] [phone]

## 2012-04-30 NOTE — Progress Notes (Signed)
Clinical Social Worker spoke with pt's wife at bedside.  CSW reviewed current plan.  Wife agreeable to [facility] as a back up.  Wife requested CSW to coordinate with pt's dtr, [facility] as wife is in HD 3 times a week.  CSW phoned [facility] and updated.  CSW phoned [place] and updated.  CSW to continue to follow and assist as needed.   Angelia Mould, MSW, [place] [phone]

## 2012-04-30 NOTE — Progress Notes (Signed)
Trach Team Patient Details Name: Johnny [name] MRN: [id number] DOB: 07/01/1934 Today's Date: 04/30/2012 Time:  -    SLP reviewed chart as part of trach team.  He continues to require vent support.  Pt. With possible to discharge to LTAC.  Recommend SLP consult at [facility] for Passy-Muir speaking valve (in line valve if vent long term).  Breck Coons [place] [phone]  04/30/2012

## 2012-05-01 DIAGNOSIS — E46 Unspecified protein-calorie malnutrition: Secondary | ICD-10-CM

## 2012-05-01 LAB — GLUCOSE, CAPILLARY
Glucose-Capillary: 85 mg/dL (ref 70–99)
Glucose-Capillary: 111 mg/dL — ABNORMAL HIGH (ref 70–99)
Glucose-Capillary: 126 mg/dL — ABNORMAL HIGH (ref 70–99)
Glucose-Capillary: 151 mg/dL — ABNORMAL HIGH (ref 70–99)

## 2012-05-01 LAB — BASIC METABOLIC PANEL
BUN: 30 mg/dL — ABNORMAL HIGH (ref 6–23)
Chloride: 113 mEq/L — ABNORMAL HIGH (ref 96–112)
Glucose, Bld: 131 mg/dL — ABNORMAL HIGH (ref 70–99)
Potassium: 4.3 mEq/L (ref 3.5–5.1)

## 2012-05-01 NOTE — Progress Notes (Signed)
Wean not attempted at this time due to increased RR and increased aggiation. RT will continue to monitor.

## 2012-05-01 NOTE — Progress Notes (Signed)
PULMONARY  / CRITICAL CARE MEDICINE  Name: YARON GRASSE MRN: [id number] DOB: 1934-08-18    LOS: 30  REFERRING MD :  TRH  CHIEF COMPLAINT:  Alcohol withdrawal/HCAP >> Cardiac arrest  BRIEF PATIENT DESCRIPTION:  78 yo male former smoker admitted 04/01/2012 with shakes and incontinent of urine/feces and found to have pneumonia in setting of ETOH abuse/withdrawal.  Developed septic shock, and PEA on 12/13 with VDRF and 20 min before ROSC.  PCCM assumed care from 12/13.   LINES / TUBES: 12/12 ETT >>12/20 12/12 L IJ CVL >> 12/20 12/13 Lt radial Aline>>12/14 12/20 Trach >> 12/21 PEG ( GI) >>  12/20 RUE PICC >>   CULTURES: 12/12 resp >>GPC in pairs and candida albicans. 12/12 blood >>NTD 12/15 >> coag neg staph  12/12 urine >>negative 12/12 influenza >negative 12/12 urine strep >> negative 12/12 urine leg >>negative 12/25 urine>>>GNR>>>psuedomonas ss zoysn 12/25 Sputum >>> multiple morphology 12/25 B cx x2> neg   ANTIBIOTICS: 12/12 Vanc (HCAP) >>12/15>>>12/26>>>12/28 12/12 Cefepime (HCAP) >>12/15 12/12 Levaqin (HCAP) >>12/20 12/26 Zosyn>>>1/1  SIGNIFICANT EVENTS:  12/12 admission and PEA arrest 12/14 Off pressors 12/20 Trach 12/24 A flutter, cardizem gtt started  TESTS: 12/12 CT head >> chronic microvascular ischemic changes 12/13 EEG >> moderate-to-severe generalized continuous slowing of cerebral activity 12/13 Echo >> EF 50 to 55% (tested while on dobutamine) 12/13 Abd u/s >> hepatic steatosis, medical renal disease, gallbladder sludge with small stones  LEVEL OF CARE:  SDU PRIMARY SERVICE:  PCCM CONSULTANTS:  None CODE STATUS: full  DIET:  Tube feeds DVT Px:  SQ heparin  GI Px:  Pantoprazole   INTERVAL HISTORY Very alert. Interactive. + F/C. No distress on full support  VITAL SIGNS: Temp:  [98.2 F (36.8 C)-99 F (37.2 C)] 98.5 F (36.9 C) (01/11 0724) Pulse Rate:  [77-116] 95  (01/11 1015) Resp:  [16-36] 36  (01/11 0851) BP: (90-168)/(47-80)  141/63 mmHg (01/11 1015) SpO2:  [100 %] 100 % (01/11 0851) FiO2 (%):  [30 %] 30 % (01/11 0851) Weight:  [75 kg (165 lb 5.5 oz)] 75 kg (165 lb 5.5 oz) (01/11 0003) VENTILATOR SETTINGS: Vent Mode:  [-] PCV FiO2 (%):  [30 %] 30 % Set Rate:  [16 bmp] 16 bmp PEEP:  [5 cmH20] 5 cmH20 Plateau Pressure:  [16 cmH20-19 cmH20] 16 cmH20   PHYSICAL EXAMINATION: Gen: RASS 0 HEENT: Trach site clean PULM: no wheeze CV: RRR s M AB: decreased bowel sounds, soft, non tender, PEG site clean.  Ext: no edema. Derm: no rashes Neuro: increased tone in BUEs  LABS: CBC  Lab 04/29/12 0820 04/29/12 0518  WBC 10.9* --  HGB 7.3* 6.9*  HCT 22.4* 21.2*  PLT 293 303   Chemistry  Lab 05/01/12 0500 04/29/12 0518  NA 142 144  K 4.3 3.0*  CL 113* 113*  CO2 18* 21  BUN 30* 36*  CREATININE 1.05 0.90  CALCIUM 9.1 8.8  MG -- --  PHOS -- --  GLUCOSE 131* 105*   CBG trend  Lab 05/01/12 0725 05/01/12 0430 05/01/12 0007 04/30/12 1956 04/30/12 1633  GLUCAP 111* 85 139* 163* 111*    Intake/Output Summary (Last 24 hours) at 05/01/12 1142 Last data filed at 05/01/12 1100  Gross per 24 hour  Intake   1090 ml  Output    975 ml  Net    115 ml   IMAGING:  CXR 1/9> Mild patchy right basilar opacity, likely atelectasis. No pleural effusion or pneumothorax. Tracheostomy & postsurgical changes  related to prior CABG.    Active ASSESSMENT / PLAN: Active Problems:  Anoxic Encephalopathy: due to hypoxia after cardiac arrest.   Failure to wean from mechanical ventilation  Tracheostomy dependence  HYPERTENSION: controlled currently.   Protein-calorie malnutrition, severe  Atrial flutter: currently NSR  COPD (chronic obstructive pulmonary disease), presumed  Electrolyte and fluid disorder: currently all WNL   Protein calorie malnutrition  DIABETES MELLITUS, TYPE II: excellent glycemic control   Anemia of other chronic disease: no evidence of bleeding   Hypervolemia: improved.   Poor dentition: needs  dental eval at some point   Episode of hypoglycemia 1/09  PLAN:   Cont pressure support weaning as tolerated F/u CXR and labs intermittently Cont current med rx Cont low dose Duragesic patch Replace electrolytes as needed   Continue tube feeds Cont SSI   Awaiting [facility] placement.    Lonzo Cloud. Kriste Basque, MD 05/01/12 @ 11:46AM

## 2012-05-01 NOTE — Progress Notes (Signed)
0000: Pt pulled vent of trach x2. Safety mitts placed bilaterally. Will continue to monitor.

## 2012-05-02 LAB — GLUCOSE, CAPILLARY
Glucose-Capillary: 117 mg/dL — ABNORMAL HIGH (ref 70–99)
Glucose-Capillary: 122 mg/dL — ABNORMAL HIGH (ref 70–99)
Glucose-Capillary: 127 mg/dL — ABNORMAL HIGH (ref 70–99)
Glucose-Capillary: 87 mg/dL (ref 70–99)

## 2012-05-02 NOTE — Progress Notes (Signed)
PULMONARY  / CRITICAL CARE MEDICINE  Name: Johnny [name] MRN: [id number] DOB: 1934/09/27    LOS: 31  REFERRING MD :  TRH  CHIEF COMPLAINT:  Alcohol withdrawal/HCAP >> Cardiac arrest  BRIEF PATIENT DESCRIPTION:  78 yo male former smoker admitted 04/01/2012 with shakes and incontinent of urine/feces and found to have pneumonia in setting of ETOH abuse/withdrawal.  Developed septic shock, and PEA on 12/13 with VDRF and 20 min before ROSC.  PCCM assumed care from 12/13.   LINES / TUBES: 12/12 ETT >>12/20 12/12 L IJ CVL >> 12/20 12/13 Lt radial Aline>>12/14 12/20 Trach >> 12/21 PEG ([facility]) >>  12/20 RUE PICC >>   CULTURES: 12/12 resp >>GPC in pairs and candida albicans. 12/12 blood >>NTD 12/15 >> coag neg staph  12/12 urine >>negative 12/12 influenza >negative 12/12 urine strep >> negative 12/12 urine leg >>negative 12/25 urine>>>GNR>>>psuedomonas ss zoysn 12/25 Sputum >>> multiple morphology 12/25 B cx x2> neg   ANTIBIOTICS: 12/12 Vanc (HCAP) >>12/15>>>12/26>>>12/28 12/12 Cefepime (HCAP) >>12/15 12/12 Levaqin (HCAP) >>12/20 12/26 Zosyn>>>1/1  SIGNIFICANT EVENTS:  12/12 admission and PEA arrest 12/14 Off pressors 12/20 Trach 12/24 A flutter, cardizem gtt started  TESTS: 12/12 CT head >> chronic microvascular ischemic changes 12/13 EEG >> moderate-to-severe generalized continuous slowing of cerebral activity 12/13 Echo >> EF 50 to 55% (tested while on dobutamine) 12/13 Abd u/s >> hepatic steatosis, medical renal disease, gallbladder sludge with small stones  LEVEL OF CARE:  SDU PRIMARY SERVICE:  PCCM CONSULTANTS:  None CODE STATUS: full  DIET:  Tube feeds DVT Px:  SQ heparin  GI Px:  Pantoprazole   INTERVAL HISTORY Very alert. Interactive. + F/C. No distress on full support  VITAL SIGNS: Temp:  [98.5 F (36.9 C)-98.9 F (37.2 C)] 98.8 F (37.1 C) (01/12 1133) Pulse Rate:  [74-92] 82  (01/12 1133) Resp:  [18-30] 18  (01/12 1133) BP: (116-160)/(41-82)  147/48 mmHg (01/12 1133) SpO2:  [100 %] 100 % (01/12 1133) FiO2 (%):  [30 %] 30 % (01/12 1133) Weight:  [75 kg (165 lb 5.5 oz)] 75 kg (165 lb 5.5 oz) (01/12 0328) VENTILATOR SETTINGS: Vent Mode:  [-] CPAP;PSV FiO2 (%):  [30 %] 30 % Set Rate:  [16 bmp] 16 bmp PEEP:  [5 cmH20] 5 cmH20 Pressure Support:  [10 cmH20-12 cmH20] 12 cmH20 Plateau Pressure:  [16 cmH20-23 cmH20] 21 cmH20   PHYSICAL EXAMINATION: Gen: RASS 0 HEENT: Trach site clean PULM: no wheeze CV: RRR s M AB: decreased bowel sounds, soft, non tender, PEG site clean.  Ext: no edema. Derm: no rashes Neuro: increased tone in BUEs  LABS: CBC  Lab 04/29/12 0820 04/29/12 0518  WBC 10.9* --  HGB 7.3* 6.9*  HCT 22.4* 21.2*  PLT 293 303   Chemistry  Lab 05/01/12 0500 04/29/12 0518  NA 142 144  K 4.3 3.0*  CL 113* 113*  CO2 18* 21  BUN 30* 36*  CREATININE 1.05 0.90  CALCIUM 9.1 8.8  MG -- --  PHOS -- --  GLUCOSE 131* 105*   CBG trend  Lab 05/02/12 0747 05/02/12 0346 05/01/12 2354 05/01/12 1955 05/01/12 1623  GLUCAP 122* 117* 113* 139* 151*    Intake/Output Summary (Last 24 hours) at 05/02/12 1136 Last data filed at 05/02/12 1100  Gross per 24 hour  Intake   1955 ml  Output   1100 ml  Net    855 ml   IMAGING:  CXR 1/9> Mild patchy right basilar opacity, likely atelectasis. No pleural  effusion or pneumothorax. Tracheostomy & postsurgical changes related to prior CABG.    Active ASSESSMENT / PLAN: Active Problems:  Anoxic Encephalopathy: due to hypoxia after cardiac arrest.   Failure to wean from mechanical ventilation  Tracheostomy dependence  HYPERTENSION: controlled currently.   Protein-calorie malnutrition, severe  Atrial flutter: currently NSR  COPD (chronic obstructive pulmonary disease), presumed  Electrolyte and fluid disorder: currently all WNL   Protein calorie malnutrition  DIABETES MELLITUS, TYPE II: excellent glycemic control   Anemia of other chronic disease: no evidence of bleeding    Hypervolemia: improved.   Poor dentition: needs dental eval at some point   Episode of hypoglycemia 1/09  PLAN:   Cont pressure support weaning as tolerated F/u CXR and labs intermittently Cont current med rx Cont low dose Duragesic patch Replace electrolytes as needed   Continue tube feeds Cont SSI PT/ OT/ rehab therapies  Awaiting [facility] placement.    Lonzo Cloud. Kriste Basque, MD 05/02/12 @ 11:36AM

## 2012-05-03 LAB — GLUCOSE, CAPILLARY
Glucose-Capillary: 136 mg/dL — ABNORMAL HIGH (ref 70–99)
Glucose-Capillary: 117 mg/dL — ABNORMAL HIGH (ref 70–99)

## 2012-05-03 NOTE — Progress Notes (Signed)
Physical Therapy Note: MD please order OT consult as well as PRAFO for foot drop. Pt is much improved with mobility and cognition. Thanks [name], PT [phone]

## 2012-05-03 NOTE — Progress Notes (Signed)
Clinical Social Worker submitted updated clinical information to [facility].    Angelia Mould, MSW, [place] [phone]

## 2012-05-03 NOTE — Progress Notes (Signed)
No sucton performed by RT, RN had just left the room and completed trach care and suction.

## 2012-05-03 NOTE — Progress Notes (Signed)
Pt placed back on full support at this time due to high RR and increased agitation. RT will monitor.

## 2012-05-03 NOTE — Progress Notes (Signed)
PULMONARY  / CRITICAL CARE MEDICINE  Name: Johnny [name] MRN: [id number] DOB: 10/12/1934    LOS: 32  REFERRING MD :  TRH  CHIEF COMPLAINT:  Alcohol withdrawal / HCAP >> Cardiac arrest  BRIEF PATIENT DESCRIPTION:  78 yo male former smoker admitted 04/01/2012 with shakes and incontinent of urine/feces and found to have pneumonia in setting of ETOH abuse/withdrawal.  Developed septic shock, and PEA on 12/13 with VDRF and 20 min before ROSC.  PCCM assumed care from 12/13.   LINES / TUBES: 12/12 ETT >>12/20 12/12 L IJ CVL >> 12/20 12/13 Lt radial Aline>>12/14 12/20 Trach >> 12/21 PEG ([facility]) >>  12/20 RUE PICC >>   CULTURES: 12/12 resp >>GPC in pairs and candida albicans. 12/12 blood >>NTD 12/15 >> coag neg staph  12/12 urine >>negative 12/12 influenza >negative 12/12 urine strep >> negative 12/12 urine leg >>negative 12/25 urine>>>GNR>>>psuedomonas ss zoysn 12/25 Sputum >>> multiple morphology 12/25 B cx x2>> neg   ANTIBIOTICS: 12/12 Vanc (HCAP) >>12/15>>>12/26>>>12/28 12/12 Cefepime (HCAP) >>12/15 12/12 Levaqin (HCAP) >>12/20 12/26 Zosyn>>>1/1  SIGNIFICANT EVENTS:  12/12 admission and PEA arrest 12/14 Off pressors 12/20 Trach 12/24 A flutter, cardizem gtt started 1/13 Did not tolerate wean due to agitation / tachypnea  TESTS: 12/12 CT head >> chronic microvascular ischemic changes 12/13 EEG >> moderate-to-severe generalized continuous slowing of cerebral activity 12/13 Echo >> EF 50 to 55% (tested while on dobutamine) 12/13 Abd u/s >> hepatic steatosis, medical renal disease, gallbladder sludge with small stones  LEVEL OF CARE:  SDU PRIMARY SERVICE:  PCCM CONSULTANTS:  None CODE STATUS: full  DIET:  Tube feeds DVT Px:  SQ heparin  GI Px:  Pantoprazole   INTERVAL HISTORY:  Did not tolerate wean due to agitation / tachypnea    VITAL SIGNS: Temp:  [97.9 F (36.6 C)-99.1 F (37.3 C)] 97.9 F (36.6 C) (01/13 1144) Pulse Rate:  [83-116] 94  (01/13  1144) Resp:  [15-27] 15  (01/13 1144) BP: (115-161)/(45-75) 115/71 mmHg (01/13 1144) SpO2:  [97 %-100 %] 100 % (01/13 1144) FiO2 (%):  [30 %] 30 % (01/13 1144) VENTILATOR SETTINGS: Vent Mode:  [-] PCV FiO2 (%):  [30 %] 30 % Set Rate:  [16 bmp] 16 bmp PEEP:  [5 cmH20] 5 cmH20 Pressure Support:  [5 cmH20-10 cmH20] 5 cmH20 Plateau Pressure:  [15 cmH20-28 cmH20] 24 cmH20   PHYSICAL EXAMINATION: Gen: chronically ill in NAD HEENT: Trach site clean, dressing under phalange, RN notes stage 3 ulceration PULM: no wheeze CV: RRR s M AB: decreased bowel sounds, soft, non tender, PEG site clean.  Ext: no edema. Derm: no rashes Neuro: increased tone in BUEs  LABS: CBC  Lab 04/29/12 0820 04/29/12 0518  WBC 10.9* --  HGB 7.3* 6.9*  HCT 22.4* 21.2*  PLT 293 303   Chemistry  Lab 05/01/12 0500 04/29/12 0518  NA 142 144  K 4.3 3.0*  CL 113* 113*  CO2 18* 21  BUN 30* 36*  CREATININE 1.05 0.90  CALCIUM 9.1 8.8  MG -- --  PHOS -- --  GLUCOSE 131* 105*   CBG trend  Lab 05/03/12 1149 05/03/12 0905 05/03/12 0311 05/02/12 2000 05/02/12 1642  GLUCAP 169* 131* 114* 136* 87    Intake/Output Summary (Last 24 hours) at 05/03/12 1401 Last data filed at 05/03/12 1300  Gross per 24 hour  Intake   1445 ml  Output   1626 ml  Net   -181 ml   IMAGING:  CXR 1/9> Mild  patchy right basilar opacity, likely atelectasis. No pleural effusion or pneumothorax. Tracheostomy & postsurgical changes related to prior CABG.    Active ASSESSMENT / PLAN:   Anoxic Encephalopathy: due to hypoxia after cardiac arrest.   Failure to wean from mechanical ventilation  Tracheostomy dependence  HYPERTENSION: controlled currently.   Protein-calorie malnutrition, severe  Atrial flutter: currently NSR  COPD (chronic obstructive pulmonary disease), presumed  Electrolyte and fluid disorder: currently all WNL   Protein calorie malnutrition  DIABETES MELLITUS, TYPE II: excellent glycemic control   Anemia of  other chronic disease: no evidence of bleeding   Hypervolemia: improved.   Poor dentition: needs dental eval at some point   Episode of hypoglycemia 1/09   PLAN:   Cont pressure support weaning as tolerated F/u CXR and labs intermittently Cont current med rx Cont low dose Duragesic patch Replace electrolytes as needed   Continue tube feeds Cont SSI PT/ OT/ rehab therapies  Awaiting LTAC placement.   Canary Brim, NP-C [facility] Pgr: [phone] or [phone]  Patient seen and examined, agree with above note.  I dictated the care and orders written for this patient under my direction.  Koren Bound, M.D. [phone]

## 2012-05-03 NOTE — Progress Notes (Signed)
Physical Therapy Treatment Patient Details Name: Johnny [name] MRN: [id number] DOB: Jun 02, 1934 Today's Date: 05/03/2012 Time: 4098-1191 PT Time Calculation (min): 32 min  PT Assessment / Plan / Recommendation Comments on Treatment Session  Pt s/p cardiac arrest, HCAP, VDRF, hypoxic encephalopathy who continues with great improvement in cognition and mobility. Pt continues to follow one step commands and accurately mouthing words today although disoriented to date. Dgtr present today throughout and educated for HEP and encouraged to perform daily with pt to improve function. Pt able to assist with mobiility and tolerate sitting. Will continue to follow.     Follow Up Recommendations        Does the patient have the potential to tolerate intense rehabilitation     Barriers to Discharge        Equipment Recommendations       Recommendations for Other Services OT consult  Frequency     Plan Discharge plan remains appropriate;Frequency remains appropriate    Precautions / Restrictions Precautions Precautions: Fall Precaution Comments: vent, peg Restrictions Weight Bearing Restrictions: No   Pertinent Vitals/Pain No pain sats 100% on vent PS/CPAP with RR 28-35 with activity    Mobility  Bed Mobility Bed Mobility: Rolling Right;Right Sidelying to Sit;Sit to Supine Rolling Right: 3: Mod assist;With rail Right Sidelying to Sit: 3: Mod assist;HOB elevated;With rails Sit to Supine: 2: Max assist Scooting to [facility]: 1: +2 Total assist Scooting to [facility]: Patient Percentage: 10% Details for Bed Mobility Assistance: Pt with hand over hand assist to reach for rail with LUE to roll right with increased tone trying to reach but able to assist with rail and bending leg to roll and transfer side to sit with assist to elevate trunk. Assist on return to supine to elevate bil LE onto surface. Pt attempted to scoot toward HOB while EOB and able to initiate anterior weight shift but unable to move  pelvis Transfers Transfers: Not assessed    Exercises General Exercises - Lower Extremity Short Arc Quad: AROM;Both;15 reps;Supine Heel Slides: AROM;Both;15 reps;Supine Hip ABduction/ADduction: AROM;AAROM;15 reps;Right;Left;Supine (AAROM on RLE due to tight hip)   PT Diagnosis:    PT Problem List:   PT Treatment Interventions:     PT Goals Acute Rehab PT Goals Pt will Roll Supine to Right Side: with min assist PT Goal: Rolling Supine to Right Side - Progress: Updated due to goal met Pt will Sit at [facility] of Bed: with supervision;3-5 min PT Goal: Sit at Edge Of Bed - Progress: Updated due to goal met Pt will Transfer Bed to Chair/Chair to Bed: with mod assist PT Transfer Goal: Bed to Chair/Chair to Bed - Progress: Goal set today PT Goal: Perform Home Exercise Program - Progress: Progressing toward goal  Visit Information  Last PT Received On: 05/03/12 Assistance Needed: +2    Subjective Data  Subjective: "my baby dgtr" - pt stating who visitor in room was accurately   Cognition  Overall Cognitive Status: Impaired Area of Impairment: Following commands Difficult to assess due to: Tracheostomy Arousal/Alertness: Awake/alert Orientation Level: Disoriented to;Time Behavior During Session: [facility] for tasks performed Following Commands: Follows one step commands consistently    Balance  Static Sitting Balance Static Sitting - Balance Support: Bilateral upper extremity supported;Feet supported Static Sitting - Level of Assistance: 4: Min assist Static Sitting - Comment/# of Minutes: pt with right lean able to correct to midline with cueing but unable to maintain unassisted greater than grossly 20 sec. EOB 5 min   End of  Session PT - End of Session Activity Tolerance: Patient tolerated treatment well Patient left: in bed;with call bell/phone within reach;with family/visitor present Nurse Communication: Mobility status   GP     [name] [facility] 05/03/2012, 9:13 AM [name], PT [phone]

## 2012-05-04 ENCOUNTER — Inpatient Hospital Stay (HOSPITAL_COMMUNITY): Payer: Medicare PPO

## 2012-05-04 DIAGNOSIS — J449 Chronic obstructive pulmonary disease, unspecified: Secondary | ICD-10-CM

## 2012-05-04 LAB — BASIC METABOLIC PANEL
BUN: 27 mg/dL — ABNORMAL HIGH (ref 6–23)
CO2: 20 mEq/L (ref 19–32)
Calcium: 8.6 mg/dL (ref 8.4–10.5)
Chloride: 106 mEq/L (ref 96–112)
Creatinine, Ser: 0.96 mg/dL (ref 0.50–1.35)
GFR calc Af Amer: >90 mL/min (ref >90)
GFR calc non Af Amer: 79 mL/min — ABNORMAL LOW (ref >90)
Glucose, Bld: 163 mg/dL — ABNORMAL HIGH (ref 70–99)
Potassium: 3.3 mEq/L — ABNORMAL LOW (ref 3.5–5.1)
Sodium: 137 mEq/L (ref 135–145)

## 2012-05-04 LAB — GLUCOSE, CAPILLARY
Glucose-Capillary: 115 mg/dL — ABNORMAL HIGH (ref 70–99)
Glucose-Capillary: 116 mg/dL — ABNORMAL HIGH (ref 70–99)
Glucose-Capillary: 153 mg/dL — ABNORMAL HIGH (ref 70–99)

## 2012-05-04 LAB — CBC
HCT: 21.9 % — ABNORMAL LOW (ref 39.0–52.0)
Hemoglobin: 7.3 g/dL — ABNORMAL LOW (ref 13.0–17.0)
MCHC: 33.3 g/dL (ref 30.0–36.0)
MCV: 93.6 fL (ref 78.0–100.0)
WBC: 7.8 10*3/uL (ref 4.0–10.5)

## 2012-05-04 MED ORDER — FREE WATER
100.0000 mL | Freq: Three times a day (TID) | Status: DC
  Administered 2012-05-04 – 2012-05-07 (×9): 100 mL

## 2012-05-04 MED ORDER — POTASSIUM CHLORIDE 20 MEQ/15ML (10%) PO LIQD
40.0000 meq | Freq: Three times a day (TID) | ORAL | Status: AC
  Administered 2012-05-04 (×2): 40 meq
  Filled 2012-05-04: qty 30

## 2012-05-04 MED ORDER — FUROSEMIDE 10 MG/ML IJ SOLN
40.0000 mg | Freq: Once | INTRAMUSCULAR | Status: AC
  Administered 2012-05-04: 40 mg via INTRAVENOUS
  Filled 2012-05-04: qty 4

## 2012-05-04 MED ORDER — JEVITY 1.2 CAL PO LIQD
1000.0000 mL | ORAL | Status: DC
  Administered 2012-05-05 – 2012-05-06 (×2): 1000 mL
  Filled 2012-05-04 (×6): qty 1000

## 2012-05-04 NOTE — Progress Notes (Signed)
PULMONARY  / CRITICAL CARE MEDICINE  Name: Johnny [name] MRN: [id number] DOB: 06/19/1934    LOS: 33  REFERRING MD :  TRH  CHIEF COMPLAINT:  Alcohol withdrawal / HCAP >> Cardiac arrest  BRIEF PATIENT DESCRIPTION:  78 yo male former smoker admitted 04/01/2012 with shakes and incontinent of urine/feces and found to have pneumonia in setting of ETOH abuse/withdrawal.  Developed septic shock, and PEA on 12/13 with VDRF and 20 min before ROSC.  PCCM assumed care from 12/13.   LINES / TUBES: 12/12 ETT >>12/20 12/12 L IJ CVL >> 12/20 12/13 Lt radial Aline>>12/14 12/20 Trach >> 12/21 PEG ([facility]) >>  12/20 RUE PICC >>   CULTURES: 12/12 resp >>GPC in pairs and candida albicans. 12/12 blood >>NTD 12/15 >> coag neg staph  12/12 urine >>negative 12/12 influenza >negative 12/12 urine strep >> negative 12/12 urine leg >>negative 12/25 urine>>>GNR>>>psuedomonas ss zoysn 12/25 Sputum >>> multiple morphology 12/25 B cx x2>> neg   ANTIBIOTICS: 12/12 Vanc (HCAP) >>12/15>>>12/26>>>12/28 12/12 Cefepime (HCAP) >>12/15 12/12 Levaqin (HCAP) >>12/20 12/26 Zosyn>>>1/1  SIGNIFICANT EVENTS:  12/12 admission and PEA arrest 12/14 Off pressors 12/20 Trach 12/24 A flutter, cardizem gtt started 1/13 Did not tolerate wean due to agitation / tachypnea  TESTS: 12/12 CT head >> chronic microvascular ischemic changes 12/13 EEG >> moderate-to-severe generalized continuous slowing of cerebral activity 12/13 Echo >> EF 50 to 55% (tested while on dobutamine) 12/13 Abd u/s >> hepatic steatosis, medical renal disease, gallbladder sludge with small stones  LEVEL OF CARE:  SDU PRIMARY SERVICE:  PCCM CONSULTANTS:  None CODE STATUS: full  DIET:  Tube feeds DVT Px:  SQ heparin  GI Px:  Pantoprazole   INTERVAL HISTORY:  Did not tolerate wean due to agitation / tachypnea    VITAL SIGNS: Temp:  [97.8 F (36.6 C)-98.9 F (37.2 C)] 98.4 F (36.9 C) (01/14 0729) Pulse Rate:  [81-123] 84  (01/14  0729) Resp:  [14-29] 16  (01/14 0729) BP: (100-179)/(33-78) 107/33 mmHg (01/14 0729) SpO2:  [100 %] 100 % (01/14 0729) FiO2 (%):  [30 %] 30 % (01/14 0729) VENTILATOR SETTINGS: Vent Mode:  [-] CPAP;PSV FiO2 (%):  [30 %] 30 % Set Rate:  [16 bmp] 16 bmp PEEP:  [5 cmH20] 5 cmH20 Pressure Support:  [5 cmH20-10 cmH20] 10 cmH20 Plateau Pressure:  [16 cmH20-37 cmH20] 16 cmH20   PHYSICAL EXAMINATION: Gen: chronically ill in NAD HEENT: Trach site clean, dressing under phalange, RN notes stage 3 ulceration PULM: no wheeze CV: RRR s M AB: decreased bowel sounds, soft, non tender, PEG site clean.  Ext: no edema. Derm: no rashes Neuro: increased tone in BUEs  LABS: CBC  Lab 05/04/12 0500 04/29/12 0820 04/29/12 0518  WBC 7.8 -- --  HGB 7.3* 7.3* 6.9*  HCT 21.9* 22.4* 21.2*  PLT 205 293 303   Chemistry  Lab 05/04/12 0500 05/01/12 0500 04/29/12 0518  NA 137 142 144  K 3.3* 4.3 3.0*  CL 106 113* 113*  CO2 20 18* 21  BUN 27* 30* 36*  CREATININE 0.96 1.05 0.90  CALCIUM 8.6 9.1 8.8  MG -- -- --  PHOS -- -- --  GLUCOSE 163* 131* 105*   CBG trend  Lab 05/04/12 0849 05/04/12 0355 05/03/12 2335 05/03/12 2031 05/03/12 1611  GLUCAP 168* 115* 116* 117* 102*    Intake/Output Summary (Last 24 hours) at 05/04/12 0904 Last data filed at 05/04/12 0838  Gross per 24 hour  Intake   1070 ml  Output  751 ml  Net    319 ml   IMAGING:  CXR 1/9> Mild patchy right basilar opacity, likely atelectasis. No pleural effusion or pneumothorax. Tracheostomy & postsurgical changes related to prior CABG.    Active ASSESSMENT / PLAN:   Anoxic Encephalopathy: due to hypoxia after cardiac arrest.   Failure to wean from mechanical ventilation  Tracheostomy dependence  HYPERTENSION: controlled currently.   Protein-calorie malnutrition, severe  Atrial flutter: currently NSR  COPD (chronic obstructive pulmonary disease), presumed  Electrolyte and fluid disorder: currently all WNL   Protein calorie  malnutrition  DIABETES MELLITUS, TYPE II: excellent glycemic control   Anemia of other chronic disease: no evidence of bleeding   Hypervolemia: improved.   Poor dentition: needs dental eval at some point   Episode of hypoglycemia 1/09   PLAN:   Cont pressure support weaning as tolerated, at 10/5 today. F/u CXR and labs intermittently, replace K today and give a single dose of lasix decreasing free water. Cont current med rx Cont low dose Duragesic patch Replace electrolytes as needed   Continue tube feeds Cont SSI PT/ OT/ rehab therapies  Awaiting [facility], likely towards the end of the week.  Alyson Reedy, M.D. [facility]. Pager: [phone]. After hours pager: [phone].

## 2012-05-04 NOTE — Progress Notes (Signed)
NUTRITION FOLLOW UP  Intervention:   1. Recommend increase TF by 10 ml every 4 hours as tolerated back to goal rate of 55 ml/hr of Jevity 1.2. Goal TF regimen will provide: 1884 kcal, 118 grams protein, 1065 ml free water. 2. RD to continue to follow nutrition care plan.  Nutrition Dx:   Inadequate oral intake related to inability to eat as evidenced by NPO. Ongoing.  Goal:   EN to meet >/=90% estimated nutrition needs. Unmet  Monitor:   Vent status, weight trends, I/O's, TF tolerance/adequacy  Assessment:   Hypoglycemia episode 1/9. Pt is awaiting [facility] placement ([facility]) per MD, likely towards the end of the week.  Currently receiving Jevity 1.2 at 30 ml/hr via PEG despite current order of Jevity 1.2 at 55 ml/hr, clarified goal rate of 55 ml/hr with RN, who subsequently increased pt to goal rate.  Continues to receive 30 ml Prostat via tube TID. Noted goal rate provides 1884 kcal, 118 grams protein, 1065 ml free water.   Free Water Flushes: 100 ml every 8 hours provides an additional 300 ml daily  Total free water: 1365 ml  Last BM: 1/13  Patient is currently intubated on ventilator support..   MV: 9.2 Temp:Temp (24hrs), Avg:98.4 F (36.9 C), Min:97.8 F (36.6 C), Max:98.9 F (37.2 C)   Height: Ht Readings from Last 1 Encounters:  04/01/12 5' 10.5" (1.791 m)    Weight Status:  Stable Wt Readings from Last 1 Encounters:  05/02/12 165 lb 5.5 oz (75 kg)  Weights mostly ranging from 160 - 170 lb since admit  Re-estimated needs:  Kcal: 1725 Protein: 115-130 gm  Fluid: per team  Skin: stage III at trach site; skin tear to sacrum  Diet Order:  NPO   Intake/Output Summary (Last 24 hours) at 05/04/12 1453 Last data filed at 05/04/12 1300  Gross per 24 hour  Intake   1154 ml  Output   1250 ml  Net    -96 ml   Labs:   Lab 05/04/12 0500 05/01/12 0500 04/29/12 0518  NA 137 142 144  K 3.3* 4.3 3.0*  CL 106 113* 113*  CO2 20 18* 21  BUN 27* 30* 36*   CREATININE 0.96 1.05 0.90  CALCIUM 8.6 9.1 8.8  MG -- -- --  PHOS -- -- --  GLUCOSE 163* 131* 105*    CBG (last 3)   Basename 05/04/12 1215 05/04/12 0849 05/04/12 0355  GLUCAP 132* 168* 115*    Scheduled Meds:    . antiseptic oral rinse  15 mL Mouth Rinse QID  . chlorhexidine  15 mL Mouth Rinse BID  . diltiazem  60 mg Oral Q8H  . feeding supplement  30 mL Per Tube TID WC  . fentaNYL  25 mcg Transdermal Q72H  . ferrous sulfate  220 mg Oral TID WC  . free water  100 mL Per Tube Q8H  . heparin subcutaneous  5,000 Units Subcutaneous Q8H  . insulin aspart  0-15 Units Subcutaneous Q4H  . metoprolol tartrate  50 mg Per Tube BID  . pantoprazole sodium  40 mg Per Tube Q24H  . potassium chloride  40 mEq Per Tube TID  . sodium chloride  10-40 mL Intracatheter Q12H    Continuous Infusions:    . sodium chloride 20 mL/hr at 05/04/12 1300  . feeding supplement (JEVITY 1.2 CAL) 1,000 mL (05/03/12 1610)   Jarold Motto MS, RD, LDN Pager: [phone] After-hours pager: [phone]

## 2012-05-04 NOTE — Progress Notes (Signed)
Pt placed on wean CP/PS 5/10. Pt failed SBT of 5/5 due to high RR and low Vt. Pt is tolerating well at this time. RT Will monitor.

## 2012-05-04 NOTE — Progress Notes (Signed)
Physical Therapy Treatment Patient Details Name: Johnny [name] MRN: [id number] DOB: 07/18/1934 Today's Date: 05/04/2012 Time: 4010-2725 PT Time Calculation (min): 29 min  PT Assessment / Plan / Recommendation Comments on Treatment Session  Pt s/p cardiac arrest, HCAP, VDRF, hypoxic encephalopathy who continues to improve with mobility and able to tolerate standing and pivot to chair today. Pt with increased secretions and condensation in vent and RN and RT aware and addressing end of session with pt suctioned prior to transfer and oral suction throughout. Pt with RR 30-39 with activity. Pt continues to follow commands but slightly resistant with rolling and pushing into sitting. Improved ability with sitting EOB after assist to achieve midline from initial right lean. Pt encouraged to continue HEP and nursing aware of transfer.     Follow Up Recommendations        Does the patient have the potential to tolerate intense rehabilitation     Barriers to Discharge        Equipment Recommendations       Recommendations for Other Services    Frequency     Plan Discharge plan remains appropriate;Frequency remains appropriate    Precautions / Restrictions Precautions Precautions: Fall Precaution Comments: vent, peg, bil mittens   Pertinent Vitals/Pain Pt reports no pain when questioned, expression grossly 2/10 with coughing CPAP, PS, 30 Fio2 sats 98%    Mobility  Bed Mobility Bed Mobility: Rolling Right;Right Sidelying to Sit;Sitting - Scoot to [employer] of Bed Rolling Right: 4: Min assist Right Sidelying to Sit: 3: Mod assist;HOB elevated (HOB 20degrees) Sitting - Scoot to Edge of Bed: 2: Max assist Transfers Transfers: Sit to Stand;Stand to [employer] Transfers Sit to Stand: 1: +2 Total assist;From bed Sit to Stand: Patient Percentage: 30% Stand to Sit: 1: +2 Total assist;To chair/3-in-1 Stand to Sit: Patient Percentage: 40% Stand Pivot Transfers: 1: +2 Total assist Stand Pivot  Transfers: Patient Percentage: 40% Details for Transfer Assistance: cueing for hand placement and assist of belt and pad for anterior translation and increased time to achieve upright with max cueing for knee/hip/trunk extension and assist to control pelvis to pivot to chair with pt able to take short shuffling steps to chair with control for descent Ambulation/Gait Ambulation/Gait Assistance: Not tested (comment)    Exercises General Exercises - Lower Extremity Long Arc Quad: AROM;Both;15 reps;Seated Hip Flexion/Marching: AROM;Both;15 reps;Seated   PT Diagnosis:    PT Problem List:   PT Treatment Interventions:     PT Goals Acute Rehab PT Goals PT Goal: Rolling Supine to Right Side - Progress: Progressing toward goal PT Goal: Sit at Edge Of Bed - Progress: Progressing toward goal PT Transfer Goal: Bed to Chair/Chair to Bed - Progress: Progressing toward goal PT Goal: Perform Home Exercise Program - Progress: Progressing toward goal  Visit Information  Last PT Received On: 05/04/12 Assistance Needed: +2    Subjective Data  Subjective: pt mouthing his name and place accurately.    Cognition  Overall Cognitive Status: Impaired Difficult to assess due to: Tracheostomy Orientation Level: Disoriented to;Time Behavior During Session: [facility] for tasks performed Following Commands: Follows one step commands consistently    Balance  Static Sitting Balance Static Sitting - Balance Support: Bilateral upper extremity supported;Feet supported Static Sitting - Level of Assistance: 5: Stand by assistance;4: Min assist Static Sitting - Comment/# of Minutes: initial min assist x 1 min with progression to minguard 1 min after achieving midline  End of Session PT - End of Session Equipment Utilized During  Treatment: Gait belt Activity Tolerance: Patient tolerated treatment well Patient left: in chair;with call bell/phone within reach;with restraints reapplied (bilateral mittens) Nurse  Communication: Mobility status   GP     [name] Beth 05/04/2012, 4:28 PM [name], PT [phone]

## 2012-05-05 LAB — BASIC METABOLIC PANEL
BUN: 32 mg/dL — ABNORMAL HIGH (ref 6–23)
Calcium: 8.8 mg/dL (ref 8.4–10.5)
GFR calc Af Amer: 80 mL/min — ABNORMAL LOW (ref >90)
GFR calc non Af Amer: 69 mL/min — ABNORMAL LOW (ref >90)
Glucose, Bld: 142 mg/dL — ABNORMAL HIGH (ref 70–99)
Potassium: 3.5 mEq/L (ref 3.5–5.1)
Sodium: 139 mEq/L (ref 135–145)

## 2012-05-05 LAB — GLUCOSE, CAPILLARY
Glucose-Capillary: 157 mg/dL — ABNORMAL HIGH (ref 70–99)
Glucose-Capillary: 182 mg/dL — ABNORMAL HIGH (ref 70–99)

## 2012-05-05 MED ORDER — FUROSEMIDE 10 MG/ML IJ SOLN
40.0000 mg | Freq: Once | INTRAMUSCULAR | Status: AC
  Administered 2012-05-05: 40 mg via INTRAVENOUS
  Filled 2012-05-05: qty 4

## 2012-05-05 MED ORDER — POTASSIUM CHLORIDE 20 MEQ/15ML (10%) PO LIQD
40.0000 meq | Freq: Three times a day (TID) | ORAL | Status: AC
  Administered 2012-05-05 (×2): 40 meq
  Filled 2012-05-05 (×2): qty 30

## 2012-05-05 NOTE — Progress Notes (Signed)
PULMONARY  / CRITICAL CARE MEDICINE  Name: Johnny [name] MRN: [id number] DOB: 15-Jun-1934    LOS: 34  REFERRING MD :  TRH  CHIEF COMPLAINT:  Alcohol withdrawal / HCAP >> Cardiac arrest  BRIEF PATIENT DESCRIPTION:  78 yo male former smoker admitted 04/01/2012 with shakes and incontinent of urine/feces and found to have pneumonia in setting of ETOH abuse/withdrawal.  Developed septic shock, and PEA on 12/13 with VDRF and 20 min before ROSC.  PCCM assumed care from 12/13.   LINES / TUBES: 12/12 ETT >>12/20 12/12 L IJ CVL >> 12/20 12/13 Lt radial Aline>>12/14 12/20 Trach >> 12/21 PEG ([facility]) >>  12/20 RUE PICC >>   CULTURES: 12/12 resp >>GPC in pairs and candida albicans. 12/12 blood >>NTD 12/15 >> coag neg staph  12/12 urine >>negative 12/12 influenza >negative 12/12 urine strep >> negative 12/12 urine leg >>negative 12/25 urine>>>GNR>>>psuedomonas ss zoysn 12/25 Sputum >>> multiple morphology 12/25 B cx x2>> neg   ANTIBIOTICS: 12/12 Vanc (HCAP) >>12/15>>>12/26>>>12/28 12/12 Cefepime (HCAP) >>12/15 12/12 Levaqin (HCAP) >>12/20 12/26 Zosyn>>>1/1  SIGNIFICANT EVENTS:  12/12 admission and PEA arrest 12/14 Off pressors 12/20 Trach 12/24 A flutter, cardizem gtt started 1/13 Did not tolerate wean due to agitation / tachypnea  TESTS: 12/12 CT head >> chronic microvascular ischemic changes 12/13 EEG >> moderate-to-severe generalized continuous slowing of cerebral activity 12/13 Echo >> EF 50 to 55% (tested while on dobutamine) 12/13 Abd u/s >> hepatic steatosis, medical renal disease, gallbladder sludge with small stones  LEVEL OF CARE:  SDU PRIMARY SERVICE:  PCCM CONSULTANTS:  None CODE STATUS: full  DIET:  Tube feeds DVT Px:  SQ heparin  GI Px:  Pantoprazole   INTERVAL HISTORY:  Did not tolerate wean due to agitation / tachypnea    VITAL SIGNS: Temp:  [98.6 F (37 C)-99.2 F (37.3 C)] 99.2 F (37.3 C) (01/15 0700) Pulse Rate:  [77-104] 93  (01/15  0930) Resp:  [14-31] 26  (01/15 0923) BP: (115-142)/(26-72) 142/47 mmHg (01/15 0930) SpO2:  [97 %-100 %] 100 % (01/15 0700) FiO2 (%):  [30 %-40 %] 40 % (01/15 0923) VENTILATOR SETTINGS: Vent Mode:  [-] PCV FiO2 (%):  [30 %-40 %] 40 % Set Rate:  [16 bmp] 16 bmp PEEP:  [5 cmH20] 5 cmH20 Pressure Support:  [10 cmH20] 10 cmH20 Plateau Pressure:  [14 cmH20-22 cmH20] 22 cmH20   PHYSICAL EXAMINATION: Gen: chronically ill in NAD HEENT: Trach site clean, dressing under phalange, RN notes stage 3 ulceration PULM: no wheeze CV: RRR s M AB: decreased bowel sounds, soft, non tender, PEG site clean.  Ext: no edema. Derm: no rashes Neuro: increased tone in BUEs  LABS: CBC  Lab 05/04/12 0500 04/29/12 0820 04/29/12 0518  WBC 7.8 -- --  HGB 7.3* 7.3* 6.9*  HCT 21.9* 22.4* 21.2*  PLT 205 293 303   Chemistry  Lab 05/05/12 0517 05/04/12 0500 05/01/12 0500  NA 139 137 142  K 3.5 3.3* 4.3  CL 107 106 113*  CO2 21 20 18*  BUN 32* 27* 30*  CREATININE 1.03 0.96 1.05  CALCIUM 8.8 8.6 9.1  MG -- -- --  PHOS -- -- --  GLUCOSE 142* 163* 131*   CBG trend  Lab 05/05/12 0747 05/05/12 0422 05/05/12 0035 05/04/12 2019 05/04/12 1536  GLUCAP 157* 156* 111* 153* 137*    Intake/Output Summary (Last 24 hours) at 05/05/12 1027 Last data filed at 05/05/12 0934  Gross per 24 hour  Intake   1389 ml  Output   1725 ml  Net   -336 ml   IMAGING:  CXR 1/9> Mild patchy right basilar opacity, likely atelectasis. No pleural effusion or pneumothorax. Tracheostomy & postsurgical changes related to prior CABG.    Active ASSESSMENT / PLAN:   Anoxic Encephalopathy: due to hypoxia after cardiac arrest.   Failure to wean from mechanical ventilation  Tracheostomy dependence  HYPERTENSION: controlled currently.   Protein-calorie malnutrition, severe  Atrial flutter: currently NSR  COPD (chronic obstructive pulmonary disease), presumed  Electrolyte and fluid disorder: currently all WNL   Protein calorie  malnutrition  DIABETES MELLITUS, TYPE II: excellent glycemic control   Anemia of other chronic disease: no evidence of bleeding   Hypervolemia: improved.   Poor dentition: needs dental eval at some point   Episode of hypoglycemia 1/09   PLAN:   Cont pressure support weaning as tolerated, at 10/5 today. F/u CXR and labs intermittently, replace K 1/15 and give an single dose of lasix and continue current free water dosing. Cont current med rx. Cont low dose Duragesic patch. Replace electrolytes as needed.  Continue tube feeds. Cont SSI. PT/ OT/ rehab therapies.  [facility]wait[facility], likely towards the end of the week.  Alyson Reedy, M.D. [facility] Pulmonary/Critical Care Medicine. Pager: [phone]. After hours pager: [phone].

## 2012-05-05 NOTE — Progress Notes (Signed)
Clinical Social Worker received notification from [place] at [facility] that they are able to accept pt pending insurance approval.  CSW provided Retail [employer] information.  CSW updated pt's dtr-Suprena.   CSW to continue to follow and assist as needed.   Angelia Mould, MSW, [place] [phone]

## 2012-05-05 NOTE — Progress Notes (Signed)
Clinical Social Worker left messages with pt's dtr, Suprena to discuss dc options.   Angelia Mould, MSW, [place] [phone]

## 2012-05-05 NOTE — Progress Notes (Signed)
RT Note: Pt tolerating well on 40% ATC at this time, SPO2 100% RR 20-26. Pt has moderate amounts of secretions but has been able to clear himself. RT will continue to monitor.

## 2012-05-06 LAB — BASIC METABOLIC PANEL
BUN: 31 mg/dL — ABNORMAL HIGH (ref 6–23)
Creatinine, Ser: 0.86 mg/dL (ref 0.50–1.35)
GFR calc Af Amer: >90 mL/min (ref >90)
GFR calc non Af Amer: 83 mL/min — ABNORMAL LOW (ref >90)

## 2012-05-06 LAB — GLUCOSE, CAPILLARY
Glucose-Capillary: 140 mg/dL — ABNORMAL HIGH (ref 70–99)
Glucose-Capillary: 154 mg/dL — ABNORMAL HIGH (ref 70–99)

## 2012-05-06 LAB — MAGNESIUM: Magnesium: 1.6 mg/dL (ref 1.5–2.5)

## 2012-05-06 MED ORDER — DILTIAZEM 12 MG/ML ORAL SUSPENSION: ORAL | Status: DC

## 2012-05-06 MED ORDER — PRO-STAT SUGAR FREE PO LIQD: 30.0000 mL | Freq: Three times a day (TID) | ORAL | Status: DC

## 2012-05-06 MED ORDER — FERROUS SULFATE 300 (60 FE) MG/5ML PO SYRP: 220.0000 mg | ORAL_SOLUTION | Freq: Three times a day (TID) | ORAL | Status: DC

## 2012-05-06 MED ORDER — SODIUM CHLORIDE 0.9 % IV SOLN: INTRAVENOUS | Status: DC

## 2012-05-06 MED ORDER — LABETALOL HCL 5 MG/ML IV SOLN: 10.0000 mg | INTRAVENOUS | Status: DC | PRN

## 2012-05-06 MED ORDER — FREE WATER: 100.0000 mL | Freq: Three times a day (TID) | Status: DC

## 2012-05-06 MED ORDER — ACETAMINOPHEN 160 MG/5ML PO SOLN: 650.0000 mg | Freq: Four times a day (QID) | ORAL | Status: DC | PRN

## 2012-05-06 MED ORDER — IPRATROPIUM-ALBUTEROL 18-103 MCG/ACT IN AERO: 4.0000 | INHALATION_SPRAY | RESPIRATORY_TRACT | Status: DC | PRN

## 2012-05-06 MED ORDER — CHLORHEXIDINE GLUCONATE 0.12 % MT SOLN: 15.0000 mL | Freq: Two times a day (BID) | OROMUCOSAL | Status: DC

## 2012-05-06 MED ORDER — PANTOPRAZOLE SODIUM 40 MG PO PACK: 40.0000 mg | PACK | ORAL | Status: DC

## 2012-05-06 MED ORDER — FENTANYL 25 MCG/HR TD PT72: 1.0000 | MEDICATED_PATCH | TRANSDERMAL | Status: DC

## 2012-05-06 MED ORDER — METOPROLOL TARTRATE 25 MG/10 ML ORAL SUSPENSION: 50.0000 mg | Freq: Two times a day (BID) | ORAL | Status: DC

## 2012-05-06 MED ORDER — HEPARIN SODIUM (PORCINE) 5000 UNIT/ML IJ SOLN: 5000.0000 [IU] | Freq: Three times a day (TID) | INTRAMUSCULAR | Status: DC

## 2012-05-06 MED ORDER — ONDANSETRON HCL 4 MG/2ML IJ SOLN: 4.0000 mg | INTRAMUSCULAR | Status: DC | PRN

## 2012-05-06 MED ORDER — JEVITY 1.2 CAL PO LIQD: 1000.0000 mL | ORAL | Status: DC

## 2012-05-06 MED ORDER — FENTANYL CITRATE 0.05 MG/ML IJ SOLN: 25.0000 ug | INTRAMUSCULAR | Status: DC | PRN

## 2012-05-06 NOTE — Progress Notes (Signed)
Clinical Social Worker received notification from [place] at [facility] that family will be at [facility] in the morning to sign paperwork.  Tenative plan is for pt to dc tomorrow to [facility].   Angelia Mould, [place] [phone]

## 2012-05-06 NOTE — Progress Notes (Signed)
Trach Team Patient Details Name: Johnny [name] MRN: [id number] DOB: January 20, 1935 Today's Date: 05/06/2012 Time:  -    SLP REVIEWED CHART AS PART OF TRACH TEAM.  PER NOTES PT. HAS BEEN TOLERATING TRACH COLLAR FOR 24 HOURS ON 40% IS HE ALERT, FOLLOWING SOME COMMANDS?  IS HE APPROPRIATE FOR PASSY-MUIR SPEAKING VALVE?  PLEASE ORDER IF AGREE.  Breck Coons [place] [phone]  05/06/2012

## 2012-05-06 NOTE — Plan of Care (Signed)
Problem: Phase I Progression Outcomes Goal: Patient tolerating weaning plan Outcome: Completed/Met Date Met:  05/06/12 Patient has been on trach collar for 24 hours now and tolerated it well.

## 2012-05-06 NOTE — Progress Notes (Signed)
Physical Therapy Treatment Patient Details Name: Johnny [name] MRN: [id number] DOB: 12/16/1934 Today's Date: 05/06/2012 Time: 4098-1191 PT Time Calculation (min): 29 min  PT Assessment / Plan / Recommendation Comments on Treatment Session  Pt s/p cardiac arrest, HCAP, VDRF, hypoxic encephalopathy who continues to improve with mobility. Pt maintained 100% sats on 40% trach collar today with HR 87-90. Pt with return of increased bil UE tone with movement today making transfers more difficult. Pt clearly processing aspects of transfers like attempting to lean forward and scoot EOB but with bil UE flexion unable to push and achieve scooting. Pt follows one step commands generally consistently with increased time and at times requires repetition and tactile cueing to be able to achieve task. Will continue to follow.     Follow Up Recommendations        Does the patient have the potential to tolerate intense rehabilitation     Barriers to Discharge        Equipment Recommendations       Recommendations for Other Services    Frequency     Plan Discharge plan remains appropriate;Frequency remains appropriate    Precautions / Restrictions Precautions Precautions: Fall Precaution Comments: vent, peg, bil mittens, bil UE flexor tone   Pertinent Vitals/Pain No pain via facial expression or questioning    Mobility  Bed Mobility Bed Mobility: Rolling Left;Left Sidelying to Sit;Scooting to [facility];Sitting - Scoot to [employer] of Bed;Sit to Sidelying Left Rolling Left: 2: Max assist Left Sidelying to Sit: 3: Mod assist Sitting - Scoot to Edge of Bed: 1: +1 Total assist Sit to Supine: 2: Max assist;HOB flat Scooting to HOB: 1: +2 Total assist Scooting to [facility]: Patient Percentage: 10% Details for Bed Mobility Assistance: Pt with hand over hand assist to attempt to reach rail for rolling but pt has returned to flexor posturing of bil UE and difficult to break for pt and get him into elbow extension or  attempting to reach. Pt remained rigid with rolling and side to sit although using RUE to assist pulling trunk to sitting. With sit to supine gravity assisted and total assist to bring legs onto surface.  Transfers Transfers: Not assessed Ambulation/Gait Ambulation/Gait Assistance: Not tested (comment)    Exercises General Exercises - Upper Extremity Shoulder Flexion: AAROM;Both;5 reps;Seated General Exercises - Lower Extremity Short Arc Quad: AROM;Both;15 reps;Seated Hip Flexion/Marching: AROM;Both;15 reps;Seated   PT Diagnosis:    PT Problem List:   PT Treatment Interventions:     PT Goals Acute Rehab PT Goals PT Goal: Rolling Supine to Left Side - Progress: Progressing toward goal PT Goal: Sit at Edge Of Bed - Progress: Progressing toward goal PT Goal: Perform Home Exercise Program - Progress: Progressing toward goal  Visit Information  Last PT Received On: 05/06/12 Assistance Needed: +2    Subjective Data  Subjective: I'm cold- pt mouthed   Cognition  Overall Cognitive Status: Impaired Difficult to assess due to: Tracheostomy Arousal/Alertness: Awake/alert Orientation Level: Disoriented to;Time Behavior During Session: [facility] for tasks performed Following Commands: Follows one step commands with increased time    Balance  Static Sitting Balance Static Sitting - Balance Support: Feet supported;No upper extremity supported Static Sitting - Level of Assistance: 5: Stand by assistance;3: Mod assist Static Sitting - Comment/# of Minutes: Pt sat EOB 15 min with initial right lean able to correct to midline with reaching for chair armrest with bil UE. When not challenged pt able to maintain midline for up to 2 min at a  time. With bil UE or bil LE movement pt with posterior right lean requiring min-mod assist to correct  End of Session PT - End of Session Activity Tolerance: Patient tolerated treatment well Patient left: in bed;with call bell/phone within reach Nurse  Communication: Mobility status   GP     [name] Beth 05/06/2012, 5:07 PM [name], PT [phone]

## 2012-05-06 NOTE — Progress Notes (Signed)
Clinical Social Worker received notification from [place] at [facility] that they are able to accept pt.  Johnny [name] currently calling family.  CSW submitted dc summary.  CSW notified RN and RNCM.  CSW to continue to follow and assist as needed.   Angelia Mould, MSW, [place] [phone]

## 2012-05-06 NOTE — Discharge Summary (Deleted)
Physician Discharge Summary     Patient ID: Johnny [name] MRN: [id number] DOB/AGE: 76-08-1934 76 y.o.  Admit date: 04/01/2012 Discharge date: 05/06/2012  Admission Diagnoses: Septic shock ETOH w/d  Acute respiratory failure  Discharge Diagnoses:  Active Problems:  Anoxic Encephalopathy: due to hypoxia after cardiac arrest.   Failure to wean from mechanical ventilation  HYPERTENSION  Tracheostomy dependence  Protein-calorie malnutrition, severe  Atrial flutter  COPD (chronic obstructive pulmonary disease), presumed  Electrolyte and fluid disorder  Protein calorie malnutrition  DIABETES MELLITUS, TYPE II  Anemia of other chronic disease  Hypervolemia  Poor dentition  HCAP (healthcare-associated pneumonia) -->resolved  Alcohol dependence s/p withdrawl   acute renal failure (resolved): Baseline creatinine 0.95 from 01/05/12.   Acute respiratory failure with hypoxia, resolved  s/p PEA Cardiac arrest: Likely related to pneumonia, acidosis, hypoxic respiratory failure in setting of ETOH withdrawal  Septic shock(785.52) in setting of pneumonia (resolved)  UTI (lower urinary tract infection) resolved.    Significant Hospital tests/ studies/ interventions and procedures  LINES / TUBES:  12/12 ETT >>12/20  12/12 L IJ CVL >> 12/20  12/13 Lt radial Aline>>12/14  12/20 Trach >>  12/21 PEG ([facility]) >>  12/20 RUE PICC >>   CULTURES:  12/12 resp >>GPC in pairs and candida albicans.  12/12 blood >>NTD  12/15 >> coag neg staph  12/12 urine >>negative  12/12 influenza >negative  12/12 urine strep >> negative  12/12 urine leg >>negative  12/25 urine>>>GNR>>>psuedomonas ss zoysn  12/25 Sputum >>> multiple morphology  12/25 B cx x2>> neg   ANTIBIOTICS:  12/12 Vanc (HCAP) >>12/15>>>12/26>>>12/28  12/12 Cefepime (HCAP) >>12/15  12/12 Levaqin (HCAP) >>12/20  12/26 Zosyn>>>1/1   SIGNIFICANT EVENTS:  12/12 admission and PEA arrest  12/14 Off pressors  12/20 Trach  12/24 A  flutter, cardizem gtt started  1/13 Did not tolerate wean due to agitation / tachypnea   TESTS:  12/12 CT head >> chronic microvascular ischemic changes  12/13 EEG >> moderate-to-severe generalized continuous slowing of cerebral activity  12/13 Echo >> EF 50 to 55% (tested while on dobutamine)  12/13 Abd u/s >> hepatic steatosis, medical renal disease, gallbladder sludge with small stones  BRIEF PATIENT DESCRIPTION:  78 yo male former smoker admitted 04/01/2012 with shakes and incontinent of urine/feces and found to have pneumonia in setting of ETOH abuse/withdrawal. Developed septic shock, and PEA on 12/13 with VDRF and 20 min before ROSC. PCCM assumed care from 12/13.  Hospital Course:  78 yo male former smoker admitted 04/01/2012 with shakes and incontinent of urine/feces and found to have pneumonia in setting of ETOH abuse/withdrawal. Developed septic shock, and PEA on 12/13 with VDRF and 20 min before ROSC. PCCM assumed care from 12/13. Admitted to ICU s/p arrest. Treated with aggressive resuscitation efforts for septic shock, and supportive care. He completed his antibiotic course and completed aggressive goal directed therapy. He did have a EEG s/p arrest that did show generalized slowing. He ultimately improved from a hemodynamic status, however he remains encephalopathic, presumed from his anoxic injury. Because of his mental status he was unable to be liberated from the vent and therefore required tracheostomy on 12/20. At time of discharge he continues to work on vent weaning and currently in on vent weaning mode, for which he is supported on full support.  He is currently on ATC and tolerating this well. All of his care suggestions are outlined below.    Problem List Anoxic Encephalopathy: due to hypoxia after cardiac arrest.  Failure to wean from mechanical ventilation: continues to work on weaning. Hope he can be eventually liberated from vent, but won't likely be a candidate for  decannulation.  Tracheostomy dependence  HYPERTENSION: controlled currently.  Protein-calorie malnutrition, severe  Atrial flutter: currently NSR  COPD (chronic obstructive pulmonary disease), presumed  Electrolyte and fluid disorder: currently all WNL  Protein calorie malnutrition: on nutrition  DIABETES MELLITUS, TYPE II: excellent glycemic control; Anemia of other chronic disease: no evidence of bleeding  Hypervolemia: improved.  Poor dentition: needs dental eval at some point  Episode of hypoglycemia 1/09  Recommendations:   -Cont pressure support weaning as tolerated with effort to get off vent, but do not think he is a good candidate for decannulation.  -Cont current med rx.  -Cont low dose Duragesic patch.  -Replace electrolytes as needed.  -Continue tube feeds.  -Cont SSI.  -PT/ OT/ rehab therapies  Discharge Exam: BP 151/67  Pulse 90  Temp 97 F (36.1 C) (Axillary)  Resp 24  Ht 5' 10.5" (1.791 m)  Wt 75 kg (165 lb 5.5 oz)  BMI 23.39 kg/m2  SpO2 100% 40%  PHYSICAL EXAMINATION:   Gen: chronically ill in NAD HEENT: Trach site clean, dressing under phalange, RN notes stage 3 ulceration  PULM: no wheeze  CV: RRR s M  AB: decreased bowel sounds, soft, non tender, PEG site clean.  Ext: no edema.  Derm: no rashes  Neuro: increased tone in BUEs  Labs at discharge Lab Results  Component Value Date   CREATININE 0.86 05/06/2012   BUN 31* 05/06/2012   NA 141 05/06/2012   K 3.9 05/06/2012   CL 107 05/06/2012   CO2 23 05/06/2012   Lab Results  Component Value Date   WBC 7.8 05/04/2012   HGB 7.3* 05/04/2012   HCT 21.9* 05/04/2012   MCV 93.6 05/04/2012   PLT 205 05/04/2012   Lab Results  Component Value Date   ALT 33 04/03/2012   AST 66* 04/03/2012   ALKPHOS 80 04/03/2012   BILITOT 0.4 04/03/2012   Lab Results  Component Value Date   INR 1.34 04/09/2012   INR 1.57* 04/02/2012   INR 1.08 07/01/2011    Current radiology studies No results  found.  Disposition:  01-Home or Self Care      Discharge Orders    Future Appointments: Provider: Department: Dept Phone: Center:   06/03/2012 10:45 AM Gordy Savers, MD [facility] [place] [phone] [facility]     Future Orders Please Complete By Expires   Increase activity slowly      Discharge instructions      Comments:   Vent Mode:  [-]  FiO2 (%):  [40 %] 40 %       Medication List     As of 05/06/2012 11:18 AM    STOP taking these medications         acetaminophen 500 MG tablet   Commonly known as: TYLENOL      clonazePAM 0.5 MG tablet   Commonly known as: KLONOPIN      folic acid 1 MG tablet   Commonly known as: FOLVITE      glipiZIDE 5 MG tablet   Commonly known as: GLUCOTROL      isosorbide mononitrate 30 MG 24 hr tablet   Commonly known as: IMDUR      labetalol 300 MG tablet   Commonly known as: NORMODYNE      magnesium oxide 400 (241.3 MG) MG tablet  Commonly known as: MAG-OX      thiamine 100 MG tablet      TAKE these medications         acetaminophen 160 MG/5ML solution   Commonly known as: TYLENOL   Place 20.3 mLs (650 mg total) into feeding tube every 6 (six) hours as needed.      albuterol-ipratropium 18-103 MCG/ACT inhaler   Commonly known as: COMBIVENT   Inhale 4 puffs into the lungs every 4 (four) hours as needed for wheezing.      chlorhexidine 0.12 % solution   Commonly known as: PERIDEX   Use as directed 15 mLs in the mouth or throat 2 (two) times daily.      diltiazem 10 mg/ml oral suspension   Commonly known as: CARDIZEM   60mg  tid      feeding supplement (JEVITY 1.2 CAL) Liqd   Place 1,000 mLs into feeding tube continuous.      feeding supplement Liqd   Place 30 mLs into feeding tube 3 (three) times daily with meals.      fentaNYL 0.05 MG/ML injection   Commonly known as: SUBLIMAZE   Inject 0.5 mLs (25 mcg total) into the vein every hour as needed for severe pain (pain).      fentaNYL 25  MCG/HR   Commonly known as: DURAGESIC - dosed mcg/hr   Place 1 patch (25 mcg total) onto the skin every 3 (three) days.      ferrous sulfate 300 (60 FE) MG/5ML syrup   Take 3.7 mLs (220 mg total) by mouth 3 (three) times daily with meals.      free water Soln   Place 100 mLs into feeding tube every 8 (eight) hours.      heparin 5000 UNIT/ML injection   Inject 1 mL (5,000 Units total) into the skin every 8 (eight) hours.      labetalol 5 MG/ML injection   Commonly known as: NORMODYNE,TRANDATE   Inject 2 mLs (10 mg total) into the vein every 2 (two) hours as needed (Until SBP < 170 mmHg).      metoprolol tartrate 25 mg/10 mL Susp   Commonly known as: LOPRESSOR   Place 20 mLs (50 mg total) into feeding tube 2 (two) times daily.      ondansetron 4 MG/2ML Soln injection   Commonly known as: ZOFRAN   Inject 2 mLs (4 mg total) into the vein every 4 (four) hours as needed for nausea or vomiting.      pantoprazole sodium 40 mg/20 mL Pack   Commonly known as: PROTONIX   Place 20 mLs (40 mg total) into feeding tube daily.      sodium chloride 0.9 % infusion   KVO          Discharged Condition: {fair, [facility] to [facility] for [facility].   Physician Statement:   The Patient was personally examined, the discharge assessment and plan has been personally reviewed and I agree with ACNP Karess Harner's assessment and plan. > 30 minutes of time have been dedicated to discharge assessment, planning and discharge instructions.   Signed: Cyntia Staley,PETE 05/06/2012, 11:18 AM

## 2012-05-07 LAB — GLUCOSE, CAPILLARY
Glucose-Capillary: 146 mg/dL — ABNORMAL HIGH (ref 70–99)
Glucose-Capillary: 139 mg/dL — ABNORMAL HIGH (ref 70–99)

## 2012-05-07 NOTE — Progress Notes (Signed)
Patient has an order to be transferred to [facility]. Report given to Candel[place], RN of [facility].

## 2012-05-07 NOTE — Progress Notes (Signed)
Clinical Social Worker received confirmation from [facility] that pt's family has signed all necessary paperwork and [facility] is able to accept pt today. CSW submitted dc summary, AVS, and vent flow sheet to [facility].  RN to phone report and call for transport.  DC packet provided to RN-Tata.   Angelia Mould, MSW, [place] [phone]

## 2012-05-07 NOTE — Discharge Summary (Signed)
Physician Discharge Summary     Patient ID: Johnny [name] MRN: [id number] DOB/AGE: Aug 28, 1934 78 y.o.  Admit date: 04/01/2012 Discharge date: 05/07/2012  Admission Diagnoses: Septic shock ETOH w/d  Acute respiratory failure  Discharge Diagnoses:  Active Problems:  DIABETES MELLITUS, TYPE II  Anemia of other chronic disease  HYPERTENSION  HCAP (healthcare-associated pneumonia) -->resolved  Alcohol dependence s/p withdrawl   acute renal failure (resolved): Baseline creatinine 0.95 from 01/05/12.   Acute respiratory failure with hypoxia, resolved  s/p PEA Cardiac arrest: Likely related to pneumonia, acidosis, hypoxic respiratory failure in setting of ETOH withdrawal  Septic shock(785.52) in setting of pneumonia (resolved)  Anoxic Encephalopathy: due to hypoxia after cardiac arrest.   Tracheostomy dependence  Protein-calorie malnutrition, severe  Atrial flutter  COPD (chronic obstructive pulmonary disease), presumed  Failure to wean from mechanical ventilation  Hypervolemia  Electrolyte and fluid disorder  Protein calorie malnutrition  Poor dentition  UTI (lower urinary tract infection) resolved.    Significant Hospital tests/ studies/ interventions and procedures  LINES / TUBES:  12/12 ETT >>12/20  12/12 L IJ CVL >> 12/20  12/13 Lt radial Aline>>12/14  12/20 Trach >>  12/21 PEG ([facility]) >>  12/20 RUE PICC >>   CULTURES:  12/12 resp >>GPC in pairs and candida albicans.  12/12 blood >>NTD  12/15 >> coag neg staph  12/12 urine >>negative  12/12 influenza >negative  12/12 urine strep >> negative  12/12 urine leg >>negative  12/25 urine>>>GNR>>>psuedomonas ss zoysn  12/25 Sputum >>> multiple morphology  12/25 B cx x2>> neg   ANTIBIOTICS:  12/12 Vanc (HCAP) >>12/15>>>12/26>>>12/28  12/12 Cefepime (HCAP) >>12/15  12/12 Levaqin (HCAP) >>12/20  12/26 Zosyn>>>1/1   SIGNIFICANT EVENTS:  12/12 admission and PEA arrest  12/14 Off pressors  12/20 Trach  12/24 A  flutter, cardizem gtt started  1/13 Did not tolerate wean due to agitation / tachypnea   TESTS:  12/12 CT head >> chronic microvascular ischemic changes  12/13 EEG >> moderate-to-severe generalized continuous slowing of cerebral activity  12/13 Echo >> EF 50 to 55% (tested while on dobutamine)  12/13 Abd u/s >> hepatic steatosis, medical renal disease, gallbladder sludge with small stones  BRIEF PATIENT DESCRIPTION:  78 yo male former smoker admitted 04/01/2012 with shakes and incontinent of urine/feces and found to have pneumonia in setting of ETOH abuse/withdrawal. Developed septic shock, and PEA on 12/13 with VDRF and 20 min before ROSC. PCCM assumed care from 12/13.  Hospital Course:  78 yo male former smoker admitted 04/01/2012 with shakes and incontinent of urine/feces and found to have pneumonia in setting of ETOH abuse/withdrawal. Developed septic shock, and PEA on 12/13 with VDRF and 20 min before ROSC. PCCM assumed care from 12/13. Admitted to ICU s/p arrest. Treated with aggressive resuscitation efforts for septic shock, and supportive care. He completed his antibiotic course and completed aggressive goal directed therapy. He did have a EEG s/p arrest that did show generalized slowing. He ultimately improved from a hemodynamic status, however he remains encephalopathic, presumed from his anoxic injury. Because of his mental status he was unable to be liberated from the vent and therefore required tracheostomy on 12/20. At time of discharge he continues to work on vent weaning and currently in on vent weaning mode, for which he is supported on full support.  He is currently on ATC and tolerating this well. All of his care suggestions are outlined below.    Problem List Anoxic Encephalopathy: due to hypoxia after cardiac arrest.  Failure to wean from mechanical ventilation: continues to work on weaning. Hope he can be eventually liberated from vent, but won't likely be a candidate for  decannulation.  Tracheostomy dependence  HYPERTENSION: controlled currently.  Protein-calorie malnutrition, severe  Atrial flutter: currently NSR  COPD (chronic obstructive pulmonary disease), presumed  Electrolyte and fluid disorder: currently all WNL  Protein calorie malnutrition: on nutrition  DIABETES MELLITUS, TYPE II: excellent glycemic control; Anemia of other chronic disease: no evidence of bleeding  Hypervolemia: improved.  Poor dentition: needs dental eval at some point  Episode of hypoglycemia 1/09  Recommendations:   -Cont pressure support weaning as tolerated with effort to get off vent, but do not think he is a good candidate for decannulation.  -Cont current med rx.  -Cont low dose Duragesic patch.  -Replace electrolytes as needed.  -Continue tube feeds.  -Cont SSI.  -PT/ OT/ rehab therapies  Discharge Exam: BP 133/33  Pulse 81  Temp 99.1 F (37.3 C) (Axillary)  Resp 21  Ht 5' 10.5" (1.791 m)  Wt 75 kg (165 lb 5.5 oz)  BMI 23.39 kg/m2  SpO2 100% 40%  PHYSICAL EXAMINATION:   Gen: chronically ill in NAD HEENT: Trach site clean, dressing under phalange, RN notes stage 3 ulceration  PULM: no wheeze  CV: RRR s M  AB: decreased bowel sounds, soft, non tender, PEG site clean.  Ext: no edema.  Derm: no rashes  Neuro: increased tone in BUEs  Labs at discharge Lab Results  Component Value Date   CREATININE 0.86 05/06/2012   BUN 31* 05/06/2012   NA 141 05/06/2012   K 3.9 05/06/2012   CL 107 05/06/2012   CO2 23 05/06/2012   Lab Results  Component Value Date   WBC 7.8 05/04/2012   HGB 7.3* 05/04/2012   HCT 21.9* 05/04/2012   MCV 93.6 05/04/2012   PLT 205 05/04/2012   Lab Results  Component Value Date   ALT 33 04/03/2012   AST 66* 04/03/2012   ALKPHOS 80 04/03/2012   BILITOT 0.4 04/03/2012   Lab Results  Component Value Date   INR 1.34 04/09/2012   INR 1.57* 04/02/2012   INR 1.08 07/01/2011    Current radiology studies No results  found.  Disposition:  03-Skilled Nursing Facility      Discharge Orders    Future Appointments: Provider: Department: Dept Phone: Center:   06/03/2012 10:45 AM Gordy Savers, MD [facility] [phone] [facility]     Future Orders Please Complete By Expires   Increase activity slowly      Discharge instructions      Comments:   Vent Mode:  [-]  FiO2 (%):  [40 %] 40 %       Medication List     As of 05/07/2012  2:35 PM    STOP taking these medications         acetaminophen 500 MG tablet   Commonly known as: TYLENOL      clonazePAM 0.5 MG tablet   Commonly known as: KLONOPIN      folic acid 1 MG tablet   Commonly known as: FOLVITE      glipiZIDE 5 MG tablet   Commonly known as: GLUCOTROL      isosorbide mononitrate 30 MG 24 hr tablet   Commonly known as: IMDUR      labetalol 300 MG tablet   Commonly known as: NORMODYNE      magnesium oxide 400 (241.3 MG) MG tablet  Commonly known as: MAG-OX      thiamine 100 MG tablet      TAKE these medications         acetaminophen 160 MG/5ML solution   Commonly known as: TYLENOL   Place 20.3 mLs (650 mg total) into feeding tube every 6 (six) hours as needed.      albuterol-ipratropium 18-103 MCG/ACT inhaler   Commonly known as: COMBIVENT   Inhale 4 puffs into the lungs every 4 (four) hours as needed for wheezing.      chlorhexidine 0.12 % solution   Commonly known as: PERIDEX   Use as directed 15 mLs in the mouth or throat 2 (two) times daily.      diltiazem 10 mg/ml oral suspension   Commonly known as: CARDIZEM   60mg  tid      feeding supplement (JEVITY 1.2 CAL) Liqd   Place 1,000 mLs into feeding tube continuous.      feeding supplement Liqd   Place 30 mLs into feeding tube 3 (three) times daily with meals.      fentaNYL 0.05 MG/ML injection   Commonly known as: SUBLIMAZE   Inject 0.5 mLs (25 mcg total) into the vein every hour as needed for severe pain (pain).      fentaNYL  25 MCG/HR   Commonly known as: DURAGESIC - dosed mcg/hr   Place 1 patch (25 mcg total) onto the skin every 3 (three) days.      ferrous sulfate 300 (60 FE) MG/5ML syrup   Take 3.7 mLs (220 mg total) by mouth 3 (three) times daily with meals.      free water Soln   Place 100 mLs into feeding tube every 8 (eight) hours.      heparin 5000 UNIT/ML injection   Inject 1 mL (5,000 Units total) into the skin every 8 (eight) hours.      labetalol 5 MG/ML injection   Commonly known as: NORMODYNE,TRANDATE   Inject 2 mLs (10 mg total) into the vein every 2 (two) hours as needed (Until SBP < 170 mmHg).      metoprolol tartrate 25 mg/10 mL Susp   Commonly known as: LOPRESSOR   Place 20 mLs (50 mg total) into feeding tube 2 (two) times daily.      ondansetron 4 MG/2ML Soln injection   Commonly known as: ZOFRAN   Inject 2 mLs (4 mg total) into the vein every 4 (four) hours as needed for nausea or vomiting.      pantoprazole sodium 40 mg/20 mL Pack   Commonly known as: PROTONIX   Place 20 mLs (40 mg total) into feeding tube daily.      sodium chloride 0.9 % infusion   KVO           Discharged Condition: {fair, [facility] to [facility] for [facility].   Physician Statement:   The Patient was personally examined, the discharge assessment and plan has been personally reviewed and I agree with ACNP Babcock's assessment and plan. > 30 minutes of time have been dedicated to discharge assessment, planning and discharge instructions.   SignedKoren Bound 05/07/2012, 2:35 PM

## 2012-05-07 NOTE — Progress Notes (Signed)
PULMONARY  / CRITICAL CARE MEDICINE  Name: Johnny [name] MRN: [id number] DOB: 05-22-1934    LOS: 36  REFERRING MD :  TRH  CHIEF COMPLAINT:  Alcohol withdrawal / HCAP >> Cardiac arrest  BRIEF PATIENT DESCRIPTION:  78 yo male former smoker admitted 04/01/2012 with shakes and incontinent of urine/feces and found to have pneumonia in setting of ETOH abuse/withdrawal.  Developed septic shock, and PEA on 12/13 with VDRF and 20 min before ROSC.  PCCM assumed care from 12/13.   LINES / TUBES: 12/12 ETT >>12/20 12/12 L IJ CVL >> 12/20 12/13 Lt radial Aline>>12/14 12/20 Trach >> 12/21 PEG ([facility]) >>  12/20 RUE PICC >>   CULTURES: 12/12 resp >>GPC in pairs and candida albicans. 12/12 blood >>NTD 12/15 >> coag neg staph  12/12 urine >>negative 12/12 influenza >negative 12/12 urine strep >> negative 12/12 urine leg >>negative 12/25 urine>>>GNR>>>psuedomonas ss zoysn 12/25 Sputum >>> multiple morphology 12/25 B cx x2>> neg   ANTIBIOTICS: 12/12 Vanc (HCAP) >>12/15>>>12/26>>>12/28 12/12 Cefepime (HCAP) >>12/15 12/12 Levaqin (HCAP) >>12/20 12/26 Zosyn>>>1/1  SIGNIFICANT EVENTS:  12/12 admission and PEA arrest 12/14 Off pressors 12/20 Trach 12/24 A flutter, cardizem gtt started 1/13 Did not tolerate wean due to agitation / tachypnea  TESTS: 12/12 CT head >> chronic microvascular ischemic changes 12/13 EEG >> moderate-to-severe generalized continuous slowing of cerebral activity 12/13 Echo >> EF 50 to 55% (tested while on dobutamine) 12/13 Abd u/s >> hepatic steatosis, medical renal disease, gallbladder sludge with small stones  LEVEL OF CARE:  SDU PRIMARY SERVICE:  PCCM CONSULTANTS:  None CODE STATUS: full  DIET:  Tube feeds DVT Px:  SQ heparin  GI Px:  Pantoprazole   INTERVAL HISTORY:  Did not tolerate wean due to agitation / tachypnea    VITAL SIGNS: Temp:  [98.1 F (36.7 C)-99.1 F (37.3 C)] 99.1 F (37.3 C) (01/17 0753) Pulse Rate:  [74-95] 80  (01/17  0801) Resp:  [14-29] 18  (01/17 0801) BP: (98-158)/(30-99) 133/33 mmHg (01/17 0801) SpO2:  [100 %] 100 % (01/17 0801) FiO2 (%):  [40 %] 40 % (01/17 0801) VENTILATOR SETTINGS: Vent Mode:  [-]  FiO2 (%):  [40 %] 40 %   PHYSICAL EXAMINATION: Gen: chronically ill in NAD HEENT: Trach site clean, dressing under phalange, RN notes stage 3 ulceration PULM: no wheeze CV: RRR s M AB: decreased bowel sounds, soft, non tender, PEG site clean.  Ext: no edema. Derm: no rashes Neuro: increased tone in BUEs  LABS: CBC  Lab 05/04/12 0500  WBC 7.8  HGB 7.3*  HCT 21.9*  PLT 205   Chemistry  Lab 05/06/12 0430 05/05/12 0517 05/04/12 0500  NA 141 139 137  K 3.9 3.5 3.3*  CL 107 107 106  CO2 23 21 20   BUN 31* 32* 27*  CREATININE 0.86 1.03 0.96  CALCIUM 9.5 8.8 8.6  MG 1.6 -- --  PHOS 3.5 -- --  GLUCOSE 154* 142* 163*   CBG trend  Lab 05/07/12 0753 05/07/12 0415 05/06/12 2344 05/06/12 2054 05/06/12 1544  GLUCAP 139* 146* 140* 154* 116*    Intake/Output Summary (Last 24 hours) at 05/07/12 1057 Last data filed at 05/07/12 0842  Gross per 24 hour  Intake   1855 ml  Output    600 ml  Net   1255 ml   IMAGING:  CXR 1/9> Mild patchy right basilar opacity, likely atelectasis. No pleural effusion or pneumothorax. Tracheostomy & postsurgical changes related to prior CABG.    Active  ASSESSMENT / PLAN:   Anoxic Encephalopathy: due to hypoxia after cardiac arrest.   Failure to wean from mechanical ventilation  Tracheostomy dependence  HYPERTENSION: controlled currently.   Protein-calorie malnutrition, severe  Atrial flutter: currently NSR  COPD (chronic obstructive pulmonary disease), presumed  Electrolyte and fluid disorder: currently all WNL   Protein calorie malnutrition  DIABETES MELLITUS, TYPE II: excellent glycemic control   Anemia of other chronic disease: no evidence of bleeding   Hypervolemia: improved.   Poor dentition: needs dental eval at some point   Episode of  hypoglycemia 1/09   PLAN:   Cont pressure support weaning as tolerated, at 10/5 today. F/u CXR and labs intermittently, replace K 1/15 and give an single dose of lasix and continue current free water dosing. Cont current med rx. Cont low dose Duragesic patch. Replace electrolytes as needed.  Continue tube feeds. Cont SSI. PT/ OT/ rehab therapies.  Awaiting [facility]AC placement, [facility]  Patient seen and examined, agree with above note.  I dictated the care and orders written for this patient under my direction.  Koren Bound, M.D. [phone]

## 2012-05-07 NOTE — Progress Notes (Signed)
PULMONARY  / CRITICAL CARE MEDICINE  Name: Johnny [name] MRN: [id number] DOB: [birth year]    LOS: 36  REFERRING MD :  TRH  CHIEF COMPLAINT:  Alcohol withdrawal / HCAP >> Cardiac arrest  BRIEF PATIENT DESCRIPTION:  78 yo male former smoker admitted 04/01/2012 with shakes and incontinent of urine/feces and found to have pneumonia in setting of ETOH abuse/withdrawal.  Developed septic shock, and PEA on 12/13 with VDRF and 20 min before ROSC.  PCCM assumed care from 12/13.   LINES / TUBES: 12/12 ETT >>12/20 12/12 L IJ CVL >> 12/20 12/13 Lt radial Aline>>12/14 12/20 Trach >> 12/21 PEG ([facility]) >>  12/20 RUE PICC >>   CULTURES: 12/12 resp >>GPC in pairs and candida albicans. 12/12 blood >>NTD 12/15 >> coag neg staph  12/12 urine >>negative 12/12 influenza >negative 12/12 urine strep >> negative 12/12 urine leg >>negative 12/25 urine>>>GNR>>>psuedomonas ss zoysn 12/25 Sputum >>> multiple morphology 12/25 B cx x2>> neg   ANTIBIOTICS: 12/12 Vanc (HCAP) >>12/15>>>12/26>>>12/28 12/12 Cefepime (HCAP) >>12/15 12/12 Levaqin (HCAP) >>12/20 12/26 Zosyn>>>1/1  SIGNIFICANT EVENTS:  12/12 admission and PEA arrest 12/14 Off pressors 12/20 Trach 12/24 A flutter, cardizem gtt started 1/13 Did not tolerate wean due to agitation / tachypnea  TESTS: 12/12 CT head >> chronic microvascular ischemic changes 12/13 EEG >> moderate-to-severe generalized continuous slowing of cerebral activity 12/13 Echo >> EF 50 to 55% (tested while on dobutamine) 12/13 Abd u/s >> hepatic steatosis, medical renal disease, gallbladder sludge with small stones  LEVEL OF CARE:  SDU PRIMARY SERVICE:  PCCM CONSULTANTS:  None CODE STATUS: full  DIET:  Tube feeds DVT Px:  SQ heparin  GI Px:  Pantoprazole   INTERVAL HISTORY:  Did not tolerate wean due to agitation / tachypnea    VITAL SIGNS: Temp:  [98.4 F (36.9 C)-99.1 F (37.3 C)] 99.1 F (37.3 C) (01/17 0753) Pulse Rate:  [74-95] 81  (01/17  1155) Resp:  [14-29] 21  (01/17 1155) BP: (98-158)/(30-40) 133/33 mmHg (01/17 0801) SpO2:  [100 %] 100 % (01/17 1155) FiO2 (%):  [40 %] 40 % (01/17 1155) VENTILATOR SETTINGS: Vent Mode:  [-]  FiO2 (%):  [40 %] 40 %   PHYSICAL EXAMINATION: Gen: chronically ill in NAD HEENT: Trach site clean, dressing under phalange, RN notes stage 3 ulceration PULM: no wheeze CV: RRR s M AB: decreased bowel sounds, soft, non tender, PEG site clean.  Ext: no edema. Derm: no rashes Neuro: increased tone in BUEs  LABS: CBC  Lab 05/04/12 0500  WBC 7.8  HGB 7.3*  HCT 21.9*  PLT 205   Chemistry  Lab 05/06/12 0430 05/05/12 0517 05/04/12 0500  NA 141 139 137  K 3.9 3.5 3.3*  CL 107 107 106  CO2 23 21 20   BUN 31* 32* 27*  CREATININE 0.86 1.03 0.96  CALCIUM 9.5 8.8 8.6  MG 1.6 -- --  PHOS 3.5 -- --  GLUCOSE 154* 142* 163*   CBG trend  Lab 05/07/12 0753 05/07/12 0415 05/06/12 2344 05/06/12 2054 05/06/12 1544  GLUCAP 139* 146* 140* 154* 116*    Intake/Output Summary (Last 24 hours) at 05/07/12 1435 Last data filed at 05/07/12 1100  Gross per 24 hour  Intake   1790 ml  Output    600 ml  Net   1190 ml   IMAGING:  CXR 1/9> Mild patchy right basilar opacity, likely atelectasis. No pleural effusion or pneumothorax. Tracheostomy & postsurgical changes related to prior CABG.    Active  ASSESSMENT / PLAN:   Anoxic Encephalopathy: due to hypoxia after cardiac arrest.   Failure to wean from mechanical ventilation  Tracheostomy dependence  HYPERTENSION: controlled currently.   Protein-calorie malnutrition, severe  Atrial flutter: currently NSR  COPD (chronic obstructive pulmonary disease), presumed  Electrolyte and fluid disorder: currently all WNL   Protein calorie malnutrition  DIABETES MELLITUS, TYPE II: excellent glycemic control   Anemia of other chronic disease: no evidence of bleeding   Hypervolemia: improved.   Poor dentition: needs dental eval at some point   Episode of  hypoglycemia 1/09   PLAN:   Cont pressure support weaning as tolerated, at 10/5 today. F/u CXR and labs intermittently, replace K 1/15 and give an single dose of lasix and continue current free water dosing. Cont current med rx. Cont low dose Duragesic patch. Replace electrolytes as needed.  Continue tube feeds. Cont SSI. PT/ OT/ rehab therapies.  Awaiting [facility], likely towards the end of the week.  Alyson Reedy, M.D. [facility] Pulmonary/Critical Care Medicine. Pager: [phone]. After hours pager: [phone].

## 2012-06-03 ENCOUNTER — Ambulatory Visit: Payer: Medicare PPO | Admitting: Internal Medicine

## 2012-07-30 ENCOUNTER — Non-Acute Institutional Stay (SKILLED_NURSING_FACILITY): Payer: Medicare PPO | Admitting: Internal Medicine

## 2012-07-30 DIAGNOSIS — G931 Anoxic brain damage, not elsewhere classified: Secondary | ICD-10-CM

## 2012-07-30 DIAGNOSIS — D638 Anemia in other chronic diseases classified elsewhere: Secondary | ICD-10-CM

## 2012-07-30 DIAGNOSIS — I4891 Unspecified atrial fibrillation: Secondary | ICD-10-CM

## 2012-07-30 DIAGNOSIS — J449 Chronic obstructive pulmonary disease, unspecified: Secondary | ICD-10-CM

## 2012-07-30 DIAGNOSIS — J4489 Other specified chronic obstructive pulmonary disease: Secondary | ICD-10-CM

## 2012-08-17 NOTE — Progress Notes (Signed)
Patient ID: Johnny [name], male   DOB: [birth year], 78 y.o.   MRN: [id number]        PROGRESS NOTE  DATE:  07/30/2012  FACILITY: [facility]   LEVEL OF CARE: SNF  Routine Visit  CHIEF COMPLAINT:  Manage atrial fibrillation and COPD.    HISTORY OF PRESENT ILLNESS:  REASSESSMENT OF ONGOING PROBLEM(S):  ATRIAL FIBRILLATION: the patients atrial fibrillation remains stable.  The patient denies DOE, tachycardia, orthopnea, transient neurological sx, pedal edema, palpitations, & PNDs.  No complications noted from the medications currently being used.   COPD: the COPD remains stable.  Pt denies sob, cough, wheezing or declining exercise tolerance.  No complications from the medications presently being used.   PAST MEDICAL HISTORY : Reviewed.  No changes.  CURRENT MEDICATIONS: Reviewed per [facility]  REVIEW OF SYSTEMS:  GENERAL: no change in appetite, no fatigue, no weight changes, no fever, chills or weakness RESPIRATORY: no cough, SOB, DOE, wheezing, hemoptysis CARDIAC: no chest pain, edema or palpitations GI: no abdominal pain, diarrhea, constipation, heart burn, nausea or vomiting  PHYSICAL EXAMINATION  VS:  T  96.1     P  78    RR  20    BP 120/70    POX %     WT (Lb) 165  GENERAL: no acute distress, normal body habitus NECK: supple, trachea midline, no neck masses, no thyroid tenderness, no thyromegaly RESPIRATORY: breathing is even & unlabored, BS CTAB CARDIAC: RRR, no murmur,no extra heart sounds, no edema GI: abdomen soft, normal BS, no masses, no tenderness, no hepatomegaly, no splenomegaly PSYCHIATRIC: the patient is alert & oriented to person, affect & behavior appropriate  LABS/RADIOLOGY: 06/2012:  Hemoglobin A1C 6.4.    05/2012:  Hemoglobin 10.6, MCV 96, otherwise CBC normal.   Glucose 155, albumin 3.4, otherwise CMP normal.    Vitamin B12 level 655, folate greater than 19.9.  ASSESSMENT/PLAN:  Atrial fibrillation.  Rate controlled.  COPD.  Well compensated.     Anemia of chronic disease.  Stable.   History of anoxic brain injury.   Currently on Seroquel.    Protein calorie malnutrition.  Secondary to history of alcohol abuse.  Continue multivitamin.    CPT CODE: 40981

## 2012-08-25 ENCOUNTER — Non-Acute Institutional Stay (SKILLED_NURSING_FACILITY): Payer: Medicare PPO | Admitting: Internal Medicine

## 2012-08-25 DIAGNOSIS — G931 Anoxic brain damage, not elsewhere classified: Secondary | ICD-10-CM

## 2012-08-25 DIAGNOSIS — I4891 Unspecified atrial fibrillation: Secondary | ICD-10-CM

## 2012-08-25 DIAGNOSIS — D631 Anemia in chronic kidney disease: Secondary | ICD-10-CM

## 2012-08-25 DIAGNOSIS — J449 Chronic obstructive pulmonary disease, unspecified: Secondary | ICD-10-CM

## 2012-08-27 ENCOUNTER — Ambulatory Visit (HOSPITAL_COMMUNITY): Payer: Medicare PPO

## 2012-08-27 ENCOUNTER — Other Ambulatory Visit (HOSPITAL_BASED_OUTPATIENT_CLINIC_OR_DEPARTMENT_OTHER): Payer: Self-pay | Admitting: Internal Medicine

## 2012-08-27 ENCOUNTER — Ambulatory Visit (HOSPITAL_COMMUNITY)
Admission: RE | Admit: 2012-08-27 | Discharge: 2012-08-27 | Disposition: A | Discharge status: Home / Self Care | Payer: Medicare PPO | Source: Ambulatory Visit | Attending: Internal Medicine | Admitting: Internal Medicine

## 2012-08-27 DIAGNOSIS — J9600 Acute respiratory failure, unspecified whether with hypoxia or hypercapnia: Secondary | ICD-10-CM

## 2012-08-27 DIAGNOSIS — I252 Old myocardial infarction: Secondary | ICD-10-CM | POA: Insufficient documentation

## 2012-08-27 DIAGNOSIS — R1310 Dysphagia, unspecified: Secondary | ICD-10-CM

## 2012-08-27 DIAGNOSIS — Z431 Encounter for attention to gastrostomy: Secondary | ICD-10-CM | POA: Insufficient documentation

## 2012-08-27 NOTE — Progress Notes (Signed)
Patient ID: Johnny [name], male   DOB: 1934/06/15, 78 y.o.   MRN: [id number]        PROGRESS NOTE  DATE:  08/25/2012  FACILITY: [facility]   LEVEL OF CARE: SNF  Routine Visit  CHIEF COMPLAINT:  Manage atrial fibrillation and COPD.    HISTORY OF PRESENT ILLNESS:  REASSESSMENT OF ONGOING PROBLEM(S):  ATRIAL FIBRILLATION: the patients atrial fibrillation remains stable.  The patient denies DOE, tachycardia, orthopnea, transient neurological sx, pedal edema, palpitations, & PNDs.  No complications noted from the medications currently being used.   COPD: the COPD remains stable.  Pt denies sob, cough, wheezing or declining exercise tolerance.  No complications from the medications presently being used.   PAST MEDICAL HISTORY : Reviewed.  No changes.  CURRENT MEDICATIONS: Reviewed per [facility]  REVIEW OF SYSTEMS:  GENERAL: no change in appetite, no fatigue, no weight changes, no fever, chills or weakness RESPIRATORY: no cough, SOB, DOE, wheezing, hemoptysis CARDIAC: no chest pain, edema or palpitations GI: no abdominal pain, diarrhea, constipation, heart burn, nausea or vomiting  PHYSICAL EXAMINATION  VS:  T  98.1     P  68    RR  18    BP 137/68    POX %     WT (Lb) 170  GENERAL: no acute distress, normal body habitus NECK: supple, trachea midline, no neck masses, no thyroid tenderness, no thyromegaly RESPIRATORY: breathing is even & unlabored, BS CTAB CARDIAC: RRR, no murmur,no extra heart sounds, no edema GI: abdomen soft, normal BS, no masses, no tenderness, no hepatomegaly, no splenomegaly PSYCHIATRIC: the patient is alert & oriented to person, affect & behavior appropriate  LABS/RADIOLOGY: 06/2012:  Hemoglobin A1C 6.4.    05/2012:  Hemoglobin 10.6, MCV 96, otherwise CBC normal.   Glucose 155, albumin 3.4, otherwise CMP normal.    Vitamin B12 level 655, folate greater than 19.9.  ASSESSMENT/PLAN:  Atrial fibrillation.  Rate controlled.  COPD.  Well compensated.     Anemia of chronic disease.  Stable.   History of anoxic brain injury.   D/C Seroquel.    Protein calorie malnutrition.  Secondary to history of alcohol abuse.  Continue multivitamin.    CPT CODE: 40981

## 2012-08-27 NOTE — Procedures (Signed)
Successful removal of gastrostomy tube. No complications

## 2012-10-04 ENCOUNTER — Non-Acute Institutional Stay (SKILLED_NURSING_FACILITY): Payer: Medicare PPO | Admitting: Internal Medicine

## 2012-10-04 DIAGNOSIS — I10 Essential (primary) hypertension: Secondary | ICD-10-CM

## 2012-10-13 ENCOUNTER — Non-Acute Institutional Stay (SKILLED_NURSING_FACILITY): Payer: Medicare PPO | Admitting: Internal Medicine

## 2012-10-13 DIAGNOSIS — D638 Anemia in other chronic diseases classified elsewhere: Secondary | ICD-10-CM

## 2012-10-13 DIAGNOSIS — I4891 Unspecified atrial fibrillation: Secondary | ICD-10-CM

## 2012-10-13 DIAGNOSIS — G9340 Encephalopathy, unspecified: Secondary | ICD-10-CM

## 2012-10-13 DIAGNOSIS — J449 Chronic obstructive pulmonary disease, unspecified: Secondary | ICD-10-CM

## 2012-10-14 NOTE — Progress Notes (Signed)
Patient ID: Johnny [name], male   DOB: [birth year], 78 y.o.   MRN: [id number]        PROGRESS NOTE  DATE:  10/13/2012  FACILITY: [facility]   LEVEL OF CARE: SNF  Discharge Visit  CHIEF COMPLAINT:  Manage atrial fibrillation and COPD.    HISTORY OF PRESENT ILLNESS: I was requested by the social worker to perform the face-to-face evaluation for discharge. Therapy has cleared him for discharge on 10-18-12.  REASSESSMENT OF ONGOING PROBLEM(S):  ATRIAL FIBRILLATION: the patients atrial fibrillation remains stable.  The patient denies DOE, tachycardia, orthopnea, transient neurological sx, pedal edema, palpitations, & PNDs.  No complications noted from the medications currently being used.   COPD: the COPD remains stable.  Pt denies sob, cough, wheezing or declining exercise tolerance.  No complications from the medications presently being used.   PAST MEDICAL HISTORY : Reviewed.  No changes.  CURRENT MEDICATIONS: Reviewed per [facility]  REVIEW OF SYSTEMS:  GENERAL: no change in appetite, no fatigue, no weight changes, no fever, chills or weakness RESPIRATORY: no cough, SOB, DOE, wheezing, hemoptysis CARDIAC: no chest pain, edema or palpitations GI: no abdominal pain, diarrhea, constipation, heart burn, nausea or vomiting  PHYSICAL EXAMINATION  VS:  T  98.3     P 70    RR  18    BP 137/68    POX %     WT (Lb) 170  GENERAL: no acute distress, normal body habitus NECK: supple, trachea midline, no neck masses, no thyroid tenderness, no thyromegaly RESPIRATORY: breathing is even & unlabored, BS CTAB CARDIAC: RRR, no murmur,no extra heart sounds, no edema GI: abdomen soft, normal BS, no masses, no tenderness, no hepatomegaly, no splenomegaly PSYCHIATRIC: the patient is alert & oriented to person, affect & behavior appropriate  LABS/RADIOLOGY: 06/2012:  Hemoglobin A1C 6.4.    05/2012:  Hemoglobin 10.6, MCV 96, otherwise CBC normal.   Glucose 155, albumin 3.4, otherwise CMP normal.     Vitamin B12 level 655, folate greater than 19.9.  ASSESSMENT/PLAN:  Atrial fibrillation.  Rate controlled.  COPD.  Well compensated.    Anemia of chronic disease.  Stable.   History of anoxic brain injury.  No residual symptoms.  Protein calorie malnutrition.  Secondary to history of alcohol abuse.  Continue multivitamin.  Hypertension-Lopressor was increased.    I have filled out patient's discharge paperwork and written prescriptions. Patient will receive home health PT, OT, ST, nursing and CNA. DME: Wheelchair and 3 in one commode (719.7, 496, 728.87)  Total discharge time greater than 30 minutes.  CPT CODE: 08657

## 2012-10-25 NOTE — Progress Notes (Signed)
Patient ID: Johnny [name], male   DOB: [birth year], 78 y.o.   MRN: [id number]        PROGRESS NOTE  DATE: 10/04/2012  FACILITY:  [facility]  LEVEL OF CARE: SNF (31)  Acute Visit  CHIEF COMPLAINT:  Manage hypertension.    HISTORY OF PRESENT ILLNESS: I was requested by the staff to assess the patient regarding above problem(s):  HTN: Pt 's HTN is unstable.  Denies CP, sob, DOE, pedal edema, headaches, dizziness or visual disturbances.  No complications from the medications currently being used.  Last BP :  142/92, 159/73, 160/82, 162/88.  PAST MEDICAL HISTORY : Reviewed.  No changes.  CURRENT MEDICATIONS: Reviewed per [facility]  REVIEW OF SYSTEMS:  GENERAL: no change in appetite, no fatigue, no weight changes, no fever, chills or weakness RESPIRATORY: no cough, SOB, DOE,, wheezing, hemoptysis CARDIAC: no chest pain, edema or palpitations GI: no abdominal pain, diarrhea, constipation, heart burn, nausea or vomiting  PHYSICAL EXAMINATION  VS:  T       P 70     RR      BP     POX %     WT (Lb)  GENERAL: no acute distress, normal body habitus NECK: supple, trachea midline, no neck masses, no thyroid tenderness, no thyromegaly RESPIRATORY: breathing is even & unlabored, BS CTAB CARDIAC: RRR, no murmur,no extra heart sounds, no edema GI: abdomen soft, normal BS, no masses, no tenderness, no hepatomegaly, no splenomegaly PSYCHIATRIC: the patient is alert & oriented to person, affect & behavior appropriate  ASSESSMENT/PLAN:  Hypertension.  Uncontrolled.  Discontinue Motrin.  Increase Lopressor to 75 mg b.i.d.   CPT CODE: 40981

## 2012-11-15 ENCOUNTER — Ambulatory Visit (INDEPENDENT_AMBULATORY_CARE_PROVIDER_SITE_OTHER): Payer: Medicare PPO | Admitting: Internal Medicine

## 2012-11-15 ENCOUNTER — Encounter: Payer: Self-pay | Admitting: Internal Medicine

## 2012-11-15 VITALS — BP 130/80 | HR 88 | Temp 98.3°F | Resp 20 | Wt 182.0 lb

## 2012-11-15 DIAGNOSIS — E119 Type 2 diabetes mellitus without complications: Secondary | ICD-10-CM

## 2012-11-15 DIAGNOSIS — F1020 Alcohol dependence, uncomplicated: Secondary | ICD-10-CM

## 2012-11-15 DIAGNOSIS — R5383 Other fatigue: Secondary | ICD-10-CM

## 2012-11-15 DIAGNOSIS — I4891 Unspecified atrial fibrillation: Secondary | ICD-10-CM

## 2012-11-15 DIAGNOSIS — I2510 Atherosclerotic heart disease of native coronary artery without angina pectoris: Secondary | ICD-10-CM

## 2012-11-15 DIAGNOSIS — R531 Weakness: Secondary | ICD-10-CM

## 2012-11-15 DIAGNOSIS — I10 Essential (primary) hypertension: Secondary | ICD-10-CM

## 2012-11-15 DIAGNOSIS — J449 Chronic obstructive pulmonary disease, unspecified: Secondary | ICD-10-CM

## 2012-11-15 DIAGNOSIS — E46 Unspecified protein-calorie malnutrition: Secondary | ICD-10-CM

## 2012-11-15 DIAGNOSIS — D638 Anemia in other chronic diseases classified elsewhere: Secondary | ICD-10-CM

## 2012-11-15 DIAGNOSIS — R29898 Other symptoms and signs involving the musculoskeletal system: Secondary | ICD-10-CM

## 2012-11-15 DIAGNOSIS — J9610 Chronic respiratory failure, unspecified whether with hypoxia or hypercapnia: Secondary | ICD-10-CM

## 2012-11-15 LAB — COMPREHENSIVE METABOLIC PANEL
AST: 17 U/L (ref 0–37)
Alkaline Phosphatase: 129 U/L — ABNORMAL HIGH (ref 39–117)
BUN: 25 mg/dL — ABNORMAL HIGH (ref 6–23)
Calcium: 9.6 mg/dL (ref 8.4–10.5)
Chloride: 106 mEq/L (ref 96–112)
Creatinine, Ser: 1 mg/dL (ref 0.4–1.5)

## 2012-11-15 LAB — CBC WITH DIFFERENTIAL/PLATELET
Basophils Relative: 0.5 % (ref 0.0–3.0)
Eosinophils Absolute: 0.2 10*3/uL (ref 0.0–0.7)
Eosinophils Relative: 3.3 % (ref 0.0–5.0)
HCT: 36 % — ABNORMAL LOW (ref 39.0–52.0)
Hemoglobin: 12.1 g/dL — ABNORMAL LOW (ref 13.0–17.0)
Lymphs Abs: 3 10*3/uL (ref 0.7–4.0)
MCHC: 33.6 g/dL (ref 30.0–36.0)
MCV: 94 fl (ref 78.0–100.0)
Monocytes Absolute: 0.8 10*3/uL (ref 0.1–1.0)
Neutro Abs: 3.4 10*3/uL (ref 1.4–7.7)
Neutrophils Relative %: 45.3 % (ref 43.0–77.0)
RBC: 3.83 Mil/uL — ABNORMAL LOW (ref 4.22–5.81)
WBC: 7.4 10*3/uL (ref 4.5–10.5)

## 2012-11-15 MED ORDER — TAMSULOSIN HCL 0.4 MG PO CAPS: 0.4000 mg | ORAL_CAPSULE | Freq: Every day | ORAL | Status: DC

## 2012-11-15 NOTE — Progress Notes (Signed)
Subjective:    Patient ID: Johnny [name], male    DOB: [birth year], 78 y.o.   MRN: [id number]  HPI   78 year old patient who is seen today in followup. He was admitted to the hospital in January for septic shock and multiorgan failure including prolonged respiratory failure requiring tracheostomy and prolonged ventilatory support. [facility] from ventilatory support. He also had a PEG tube at that time which was the successfully discontinued. After one month he was transferred to [facility] rehabilitation facility and was finally discharged home on July 2. He continues to receive in-home physical therapy. He remains quite weak and deconditioned. He is ambulatory  with a walker. He has been abstinent from alcohol. He has history of diabetes but more recently has been managed off medication  Past Medical History  Diagnosis Date  . Acute alcoholic hepatitis 05/20/2010  . Anemia of other chronic disease 08/16/2007  . Atrial fibrillation 08/12/2007  . CORONARY ARTERY DISEASE 12/11/2006  . HYPERTENSION 12/11/2006  . Hyperlipidemia   . ETOH abuse   . Chronic low back pain   . Pneumonia     "now; never before" (04/01/2012)  . Hallucination 04/01/2012  . Exertional dyspnea   . DIABETES MELLITUS, TYPE II 12/11/2006  . Arthritis     "think that's what's in my back" (04/01/2012)  . Altered mental status 04/01/2012  . Seizures 04/01/2012    seizure-like activity @ home today/notes 04/01/2012     History   Social History  . Marital Status: Married    Spouse Name: N/A    Number of Children: N/A  . Years of Education: N/A   Occupational History  . Not on file.   Social History Main Topics  . Smoking status: Former Smoker -- 0.50 packs/day for 17 years    Types: Cigarettes    Quit date: 04/22/1983  . Smokeless tobacco: Never Used     Comment: 04/01/2012 "quit smoking 20+ yr ago"  . Alcohol Use: 10.8 oz/week    2 Glasses of wine, 16 Shots of  liquor per week     Comment: hx of ETOH abuse; 04/01/2012 "drink ~ 1 pint//wk; vodka; cup of wine/wk"   . Drug Use: No  . Sexually Active: No   Other Topics Concern  . Not on file   Social History Narrative  . No narrative on file    Past Surgical History  Procedure Laterality Date  . Coronary artery bypass graft  ~ 2003    CABG X4  . Cataract extraction w/ intraocular lens  implant, bilateral      "years ago" (04/01/2012)  . Esophagogastroduodenoscopy  04/13/2012    Procedure: ESOPHAGOGASTRODUODENOSCOPY (EGD);  Surgeon: Florencia Reasons, MD;  Location: [facility] ENDOSCOPY;  Service: Endoscopy;  Laterality: N/A;  push peg  . Peg placement  04/13/2012    Procedure: PERCUTANEOUS ENDOSCOPIC GASTROSTOMY (PEG) PLACEMENT;  Surgeon: Florencia Reasons, MD;  Location: [facility] ENDOSCOPY;  Service: Endoscopy;  Laterality: N/A;    Family History  Problem Relation Age of Onset  . Heart failure Mother   . Aneurysm Sister   . Suicidality Brother   . Heart failure Brother   . Diabetes Sister     No Known Allergies  Current Outpatient Prescriptions on File Prior to Visit  Medication Sig Dispense Refill  . metoprolol tartrate (LOPRESSOR) 25 mg/10 mL SUSP Place 20 mLs (50 mg total) into feeding tube 2 (two) times daily.  No current facility-administered medications on file prior to visit.    BP 130/80  Pulse 88  Temp(Src) 98.3 F (36.8 C) (Oral)  Resp 20  Wt 182 lb (82.555 kg)  BMI 25.74 kg/m2  SpO2 91%      Review of Systems  Constitutional: Negative for fever, chills, appetite change and fatigue.  HENT: Negative for hearing loss, ear pain, congestion, sore throat, trouble swallowing, neck stiffness, dental problem, voice change and tinnitus.   Eyes: Negative for pain, discharge and visual disturbance.  Respiratory: Negative for cough, chest tightness, wheezing and stridor.   Cardiovascular: Negative for chest pain, palpitations and leg swelling.  Gastrointestinal: Negative for  nausea, vomiting, abdominal pain, diarrhea, constipation, blood in stool and abdominal distention.  Genitourinary: Negative for urgency, hematuria, flank pain, discharge, difficulty urinating and genital sores.  Musculoskeletal: Positive for gait problem. Negative for myalgias, back pain, joint swelling and arthralgias.  Skin: Negative for rash.  Neurological: Positive for weakness. Negative for dizziness, syncope, speech difficulty, numbness and headaches.  Hematological: Negative for adenopathy. Does not bruise/bleed easily.  Psychiatric/Behavioral: Negative for behavioral problems and dysphoric mood. The patient is not nervous/anxious.        Objective:   Physical Exam  Constitutional: He is oriented to person, place, and time. He appears well-developed.   Appears chronically ill Weak Ambulatory with a walker Blood pressure 130/80  HENT:  Head: Normocephalic.  Right Ear: External ear normal.  Left Ear: External ear normal.  Eyes: Conjunctivae and EOM are normal.  Neck: Normal range of motion.  Well-healed tracheostomy site  Cardiovascular: Normal rate and normal heart sounds.   Pulmonary/Chest: Breath sounds normal.  Abdominal: Bowel sounds are normal.  Musculoskeletal: Normal range of motion. He exhibits no edema and no tenderness.  Neurological: He is alert and oriented to person, place, and time.  Considerable deconditioning Unable to stand from a sitting position  Psychiatric: He has a normal mood and affect. His behavior is normal.          Assessment & Plan:   Status post septic shock with multiorgan failure Deconditioning Hypertension. Continue metoprolol Coronary artery disease. Continue metoprolol Diabetes mellitus. Will check lab including hemoglobin A1c  history of atrial fibrillation. History of alcohol abuse  Recheck 1 month

## 2012-11-15 NOTE — Patient Instructions (Addendum)
It is important that you exercise regularly, at least 20 minutes 3 to 4 times per week.  If you develop chest pain or shortness of breath seek  medical attention.  Continue physical therapy  Limit your sodium (Salt) intake

## 2012-11-22 ENCOUNTER — Telehealth: Payer: Self-pay | Admitting: Internal Medicine

## 2012-11-22 DIAGNOSIS — E119 Type 2 diabetes mellitus without complications: Secondary | ICD-10-CM

## 2012-11-22 DIAGNOSIS — D638 Anemia in other chronic diseases classified elsewhere: Secondary | ICD-10-CM

## 2012-11-22 DIAGNOSIS — R531 Weakness: Secondary | ICD-10-CM

## 2012-11-22 NOTE — Telephone Encounter (Signed)
Pt daughter is requesting CNA for her dad. Pt needs help with bathing,light house keeping,cooking and transportation to grocery store etc. Pt has medicare. Pt daughter is aware MD out of office until Wednesday.

## 2012-11-24 NOTE — Telephone Encounter (Signed)
Referral done to Home Health and pt 's daughter Oran Rein notified that someone will be contacting them. Suprena verbalized understanding.

## 2012-11-24 NOTE — Telephone Encounter (Signed)
ok 

## 2012-11-24 NOTE — Telephone Encounter (Signed)
Please advise if okay to do Home Health Referral?

## 2012-12-21 ENCOUNTER — Telehealth: Payer: Self-pay | Admitting: Internal Medicine

## 2012-12-21 NOTE — Telephone Encounter (Signed)
[place] RN requesting orders for:  A home health aide 2x/wk starting this week for 3 wks Social Work consult for [employer].  OT evaluate & treat - patient has a really hard time raising his arms  Also, for her to see him (as nursing) once/wk for 3 more wks.  Please call her at her cell with verbal orders. Ok to [facility] with name.

## 2012-12-21 NOTE — Telephone Encounter (Signed)
Please advise if orders okay?

## 2012-12-21 NOTE — Telephone Encounter (Signed)
Called and spoke to [place] gave her verbal orders for pt as stated below okay per Dr.K she asked I call back and leave message because she was with a client and could not write it down. Called back and left message on voicemail orders okay.

## 2012-12-21 NOTE — Telephone Encounter (Signed)
ok 

## 2012-12-22 ENCOUNTER — Telehealth: Payer: Self-pay | Admitting: Internal Medicine

## 2012-12-22 NOTE — Telephone Encounter (Signed)
Pt needs refill of metoprolol tartrate (LOPRESSOR) 50mg  per daughter.  Pt takes 1.5 tabs twice a day.  [facility] to [facility] [address].

## 2012-12-22 NOTE — Telephone Encounter (Signed)
Called Clydie Braun back gave her verbal order for OT to treat patient. Clydie Braun verbalized understanding.

## 2012-12-22 NOTE — Telephone Encounter (Signed)
Calling to get a verbal order for OT, pt was seen today for evaluation and needs order to treat.Marland Kitchen

## 2012-12-22 NOTE — Telephone Encounter (Signed)
Left message on voicemail to call office. Need to clarify medication.

## 2012-12-24 MED ORDER — METOPROLOL TARTRATE 50 MG PO TABS: 75.0000 mg | ORAL_TABLET | Freq: Two times a day (BID) | ORAL | Status: DC

## 2012-12-24 NOTE — Telephone Encounter (Signed)
Suprena called back pt's daughter she said pt is on tablets since home not liquid. Told her okay will send Rx to pharmacy. Rx sent.

## 2012-12-24 NOTE — Telephone Encounter (Signed)
[facility] contact returned your call. She states that this is definitely his dosage. I asked her about the suspension liquid, as that is what we have. She said he was only doing that while in the nursing home. That now he is at home and taking 50mg , 1/5 tablets twice daily. She said that is also [facility].  He is out of this med, and she is requesting this be done today. Please advise.

## 2013-01-06 ENCOUNTER — Telehealth: Payer: Self-pay | Admitting: Internal Medicine

## 2013-01-06 NOTE — Telephone Encounter (Signed)
Home care calling to notify MD that OT visits will resume next week.

## 2013-01-11 DIAGNOSIS — M6281 Muscle weakness (generalized): Secondary | ICD-10-CM

## 2013-01-11 DIAGNOSIS — I10 Essential (primary) hypertension: Secondary | ICD-10-CM

## 2013-01-11 DIAGNOSIS — J449 Chronic obstructive pulmonary disease, unspecified: Secondary | ICD-10-CM

## 2013-01-11 DIAGNOSIS — E119 Type 2 diabetes mellitus without complications: Secondary | ICD-10-CM

## 2013-01-17 ENCOUNTER — Other Ambulatory Visit: Payer: Self-pay | Admitting: Internal Medicine

## 2013-01-17 ENCOUNTER — Encounter: Payer: Self-pay | Admitting: Internal Medicine

## 2013-01-17 ENCOUNTER — Ambulatory Visit (INDEPENDENT_AMBULATORY_CARE_PROVIDER_SITE_OTHER): Payer: Self-pay | Admitting: Internal Medicine

## 2013-01-17 VITALS — BP 140/80 | HR 65 | Temp 97.6°F | Resp 20 | Wt 184.0 lb

## 2013-01-17 DIAGNOSIS — I2510 Atherosclerotic heart disease of native coronary artery without angina pectoris: Secondary | ICD-10-CM

## 2013-01-17 DIAGNOSIS — Z23 Encounter for immunization: Secondary | ICD-10-CM

## 2013-01-17 DIAGNOSIS — I4891 Unspecified atrial fibrillation: Secondary | ICD-10-CM

## 2013-01-17 DIAGNOSIS — E119 Type 2 diabetes mellitus without complications: Secondary | ICD-10-CM

## 2013-01-17 DIAGNOSIS — J449 Chronic obstructive pulmonary disease, unspecified: Secondary | ICD-10-CM

## 2013-01-17 DIAGNOSIS — J4489 Other specified chronic obstructive pulmonary disease: Secondary | ICD-10-CM

## 2013-01-17 DIAGNOSIS — I10 Essential (primary) hypertension: Secondary | ICD-10-CM

## 2013-01-17 DIAGNOSIS — R531 Weakness: Secondary | ICD-10-CM

## 2013-01-17 DIAGNOSIS — R5381 Other malaise: Secondary | ICD-10-CM

## 2013-01-17 MED ORDER — METOPROLOL TARTRATE 50 MG PO TABS: 75.0000 mg | ORAL_TABLET | Freq: Two times a day (BID) | ORAL | Status: DC

## 2013-01-17 MED ORDER — TAMSULOSIN HCL 0.4 MG PO CAPS: 0.4000 mg | ORAL_CAPSULE | Freq: Every day | ORAL | Status: DC

## 2013-01-17 NOTE — Patient Instructions (Addendum)
It is important that you exercise regularly, at least 20 minutes 3 to 4 times per week.  If you develop chest pain or shortness of breath seek  medical attention.  Limit your sodium (Salt) intake  Take [phone] mg of Tylenol every 6 hours as needed for pain relief or fever.  Avoid taking more than 3000 mg in a 24-hour period (  This may cause liver damage).

## 2013-01-17 NOTE — Progress Notes (Signed)
Per pt received Pneumonia shot in the hospital when he was in.

## 2013-01-17 NOTE — Progress Notes (Signed)
Called [facility] spoke to Henagar asked her if pt's is still being seen by them? She said yes. Told her would like to give orders for PT and OT. Talbert Forest said need to call other office at [phone]. Told her okay, thanks.  Called and spoke to [place] and gave verbal order for PT and OT. She said pt already had OT and they have done all they can with pt. Told her okay can we do PT? French Ana said yes, just fax order over. Told her okay will fax order for PT evaluation.  Order for PT evaluation faxed to [phone].

## 2013-01-17 NOTE — Progress Notes (Signed)
Subjective:    Patient ID: Johnny [name], male    DOB: Aug 22, 1934, 78 y.o.   MRN: [id number]  HPI 78 year old patient who is seen today in followup. He was admitted to the hospital in January for septic shock and multiorgan failure including prolonged respiratory failure requiring tracheostomy and prolonged ventilatory support. [facility] where he was successfully weaned from ventilatory support. He also had a PEG tube at that time which was the successfully discontinued. After one month he was transferred to [facility] rehabilitation facility and was finally discharged home on July 2. He has from home nursing care with OT and PT but this has recently been terminated. He requests additional home health assistance and PT. He remains quite weak and deconditioned. He is ambulatory with a walker.  He has been abstinent from alcohol.  He has history of diabetes but more recently has been managed off medication  Past Medical History  Diagnosis Date  . Acute alcoholic hepatitis 05/20/2010  . Anemia of other chronic disease 08/16/2007  . Atrial fibrillation 08/12/2007  . CORONARY ARTERY DISEASE 12/11/2006  . HYPERTENSION 12/11/2006  . Hyperlipidemia   . ETOH abuse   . Chronic low back pain   . Pneumonia     "now; never before" (04/01/2012)  . Hallucination 04/01/2012  . Exertional dyspnea   . DIABETES MELLITUS, TYPE II 12/11/2006  . Arthritis     "think that's what's in my back" (04/01/2012)  . Altered mental status 04/01/2012  . Seizures 04/01/2012    seizure-like activity @ home today/notes 04/01/2012     History   Social History  . Marital Status: Married    Spouse Name: N/A    Number of Children: N/A  . Years of Education: N/A   Occupational History  . Not on file.   Social History Main Topics  . Smoking status: Former Smoker -- 0.50 packs/day for 17 years    Types: Cigarettes    Quit date: 04/22/1983  . Smokeless tobacco: Never Used     Comment: 04/01/2012 "quit  smoking 20+ yr ago"  . Alcohol Use: 10.8 oz/week    2 Glasses of wine, 16 Shots of liquor per week     Comment: hx of ETOH abuse; 04/01/2012 "drink ~ 1 pint//wk; vodka; cup of wine/wk"   . Drug Use: No  . Sexual Activity: No   Other Topics Concern  . Not on file   Social History Narrative  . No narrative on file    Past Surgical History  Procedure Laterality Date  . Coronary artery bypass graft  ~ 2003    CABG X4  . Cataract extraction w/ intraocular lens  implant, bilateral      "years ago" (04/01/2012)  . Esophagogastroduodenoscopy  04/13/2012    Procedure: ESOPHAGOGASTRODUODENOSCOPY (EGD);  Surgeon: Florencia Reasons, MD;  Location: [facility] ENDOSCOPY;  Service: Endoscopy;  Laterality: N/A;  push peg  . Peg placement  04/13/2012    Procedure: PERCUTANEOUS ENDOSCOPIC GASTROSTOMY (PEG) PLACEMENT;  Surgeon: Florencia Reasons, MD;  Location: [facility] ENDOSCOPY;  Service: Endoscopy;  Laterality: N/A;    Family History  Problem Relation Age of Onset  . Heart failure Mother   . Aneurysm Sister   . Suicidality Brother   . Heart failure Brother   . Diabetes Sister     No Known Allergies  Current Outpatient Prescriptions on File Prior to Visit  Medication Sig Dispense Refill  . Alcohol Swabs (ALCOHOL PADS) 70 % PADS       .  aspirin 81 MG tablet Take 81 mg by mouth daily.      . Blood Glucose Monitoring Suppl (EASY PLUS BLOOD GLUCOSE SYSTEM) DEVI       . EASY COMFORT LANCETS MISC       . EASY PLUS II GLUCOSE TEST test strip       . ferrous sulfate 325 (65 FE) MG tablet Take 325 mg by mouth daily with breakfast.      . metoprolol (LOPRESSOR) 50 MG tablet Take 1.5 tablets (75 mg total) by mouth 2 (two) times daily.  180 tablet  3  . Multiple Vitamin (MULTIVITAMIN) tablet Take 1 tablet by mouth daily.      . tamsulosin (FLOMAX) 0.4 MG CAPS Take 1 capsule (0.4 mg total) by mouth daily.  30 capsule  3   No current facility-administered medications on file prior to visit.    BP 140/80  Pulse  65  Temp(Src) 97.6 F (36.4 C) (Oral)  Resp 20  Wt 184 lb (83.462 kg)  BMI 26.02 kg/m2  SpO2 97%     Review of Systems  Constitutional: Positive for fatigue. Negative for fever, chills and appetite change.  HENT: Negative for hearing loss, ear pain, congestion, sore throat, trouble swallowing, neck stiffness, dental problem, voice change and tinnitus.   Eyes: Negative for pain, discharge and visual disturbance.  Respiratory: Negative for cough, chest tightness, wheezing and stridor.   Cardiovascular: Negative for chest pain, palpitations and leg swelling.  Gastrointestinal: Negative for nausea, vomiting, abdominal pain, diarrhea, constipation, blood in stool and abdominal distention.  Genitourinary: Negative for urgency, hematuria, flank pain, discharge, difficulty urinating and genital sores.  Musculoskeletal: Positive for back pain and gait problem. Negative for myalgias, joint swelling and arthralgias.  Skin: Negative for rash.  Neurological: Positive for weakness. Negative for dizziness, syncope, speech difficulty, numbness and headaches.  Hematological: Negative for adenopathy. Does not bruise/bleed easily.  Psychiatric/Behavioral: Negative for behavioral problems and dysphoric mood. The patient is not nervous/anxious.        Objective:   Physical Exam  Constitutional: He is oriented to person, place, and time. He appears well-developed.  Elderly frail deconditioned using a walker blood pressure 130/80   HENT:  Head: Normocephalic.  Right Ear: External ear normal.  Left Ear: External ear normal.  Eyes: Conjunctivae and EOM are normal.  Neck: Normal range of motion.  Cardiovascular: Normal rate, regular rhythm and normal heart sounds.   Pedal pulses absent  Pulmonary/Chest: Breath sounds normal.  Abdominal: Bowel sounds are normal.  Musculoskeletal: Normal range of motion. He exhibits no edema and no tenderness.  Neurological: He is alert and oriented to person, place,  and time.  Absent vibratory sensation distally Decreased monofilament testing  Psychiatric: He has a normal mood and affect. His behavior is normal.          Assessment & Plan:   Status post multiorgan failure and septic shock with deconditioning History of alcoholism History of atrial fibrillation. Remains normal sinus rhythm. Gait instability. Patient not a candidate for anticoagulation therapy Peripheral diabetic neuropathy Diabetes mellitus. Stable off medication  Recheck 3 months We'll set up home PT

## 2013-01-31 ENCOUNTER — Telehealth: Payer: Self-pay | Admitting: Internal Medicine

## 2013-01-31 NOTE — Telephone Encounter (Signed)
Calling to check status of refill that was faxed to office on 12/30/12 requesting diabetic testing supplies.  Please call back at [phone] ext [phone] with status of refill request.

## 2013-02-04 NOTE — Telephone Encounter (Signed)
Spoke to pt asked him where he is getting Diabetic supplies from? Pt stated the local pharmacy. Told pt okay. Pt stated no one has been coming to the house to help with bath. Asked pt if Home Health has been coming? Pt stated no he was told in August someone would be coming. Told pt I would look into it and get back to him. Pt verbalized understanding.

## 2013-02-04 NOTE — Telephone Encounter (Signed)
Called [facility] and spoke to [place] asked her if pt was being seen by them? Sharman Crate said pt was discharged on 9/26. Michel Harrow I sent a new order on 9/29 for Physical Therapy and asked her if I need to send new orders for pt to have help with ADL's. Told Sharman Crate was not aware pt was discharged from services and why PT was not done. Sharman Crate said she will have someone from therapy call me back. Told her okay.

## 2013-02-04 NOTE — Telephone Encounter (Signed)
Called Stephanie at [facility], left detailed message pt is not getting diabetic supplies from this company will not be sending orders please cancel faxes.

## 2013-02-07 NOTE — Telephone Encounter (Signed)
Called French Ana at [facility] and asked her why pt was not getting Physical Therapy? French Ana said they had no order and pt was discharged from OT and home health because he met goals. Told her PT was ordered on 9/29 by Dr. Jodelle Gross said they did not receive it can send order and they can evaluate pt. Told her okay. Order for PT faxed to [facility] at [phone].

## 2013-04-18 ENCOUNTER — Encounter: Payer: Self-pay | Admitting: Internal Medicine

## 2013-04-18 ENCOUNTER — Ambulatory Visit (INDEPENDENT_AMBULATORY_CARE_PROVIDER_SITE_OTHER): Payer: Medicare Other | Admitting: Internal Medicine

## 2013-04-18 VITALS — BP 110/80 | Temp 98.1°F | Wt 184.0 lb

## 2013-04-18 DIAGNOSIS — R531 Weakness: Secondary | ICD-10-CM

## 2013-04-18 DIAGNOSIS — R5381 Other malaise: Secondary | ICD-10-CM

## 2013-04-18 DIAGNOSIS — I10 Essential (primary) hypertension: Secondary | ICD-10-CM

## 2013-04-18 DIAGNOSIS — I2510 Atherosclerotic heart disease of native coronary artery without angina pectoris: Secondary | ICD-10-CM

## 2013-04-18 DIAGNOSIS — E119 Type 2 diabetes mellitus without complications: Secondary | ICD-10-CM

## 2013-04-18 NOTE — Patient Instructions (Addendum)
Limit your sodium (Salt) intake    It is important that you exercise regularly, at least 20 minutes 3 to 4 times per week.  If you develop chest pain or shortness of breath seek  medical attention.  Please see your eye doctor yearly to check for diabetic eye damage  

## 2013-04-18 NOTE — Progress Notes (Signed)
Subjective:    Patient ID: Johnny [name], male    DOB: [birth year], 78 y.o.   MRN: [id number]  HPI  78 year old patient who has a history of diet-controlled diabetes. He has coronary artery disease history of hypertension and COPD. He was hospitalized for a prolonged illness about 11 months ago with multi-organ failure including prolonged ventilatory support. He was receiving home physical therapy until 3 months ago. He feels that he would benefit from additional therapy. His cardiac status has been stable. His only complaint is weakness Hemoglobin A1c has had generally been under excellent control off medication  Past Medical History  Diagnosis Date  . Acute alcoholic hepatitis 05/20/2010  . Anemia of other chronic disease 08/16/2007  . Atrial fibrillation 08/12/2007  . CORONARY ARTERY DISEASE 12/11/2006  . HYPERTENSION 12/11/2006  . Hyperlipidemia   . ETOH abuse   . Chronic low back pain   . Pneumonia     "now; never before" (04/01/2012)  . Hallucination 04/01/2012  . Exertional dyspnea   . DIABETES MELLITUS, TYPE II 12/11/2006  . Arthritis     "think that's what's in my back" (04/01/2012)  . Altered mental status 04/01/2012  . Seizures 04/01/2012    seizure-like activity @ home today/notes 04/01/2012     History   Social History  . Marital Status: Married    Spouse Name: N/A    Number of Children: N/A  . Years of Education: N/A   Occupational History  . Not on file.   Social History Main Topics  . Smoking status: Former Smoker -- 0.50 packs/day for 17 years    Types: Cigarettes    Quit date: 04/22/1983  . Smokeless tobacco: Never Used     Comment: 04/01/2012 "quit smoking 20+ yr ago"  . Alcohol Use: 10.8 oz/week    2 Glasses of wine, 16 Shots of liquor per week     Comment: hx of ETOH abuse; 04/01/2012 "drink ~ 1 pint//wk; vodka; cup of wine/wk"   . Drug Use: No  . Sexual Activity: No   Other Topics Concern  . Not on file   Social History Narrative  . No narrative  on file    Past Surgical History  Procedure Laterality Date  . Coronary artery bypass graft  ~ 2003    CABG X4  . Cataract extraction w/ intraocular lens  implant, bilateral      "years ago" (04/01/2012)  . Esophagogastroduodenoscopy  04/13/2012    Procedure: ESOPHAGOGASTRODUODENOSCOPY (EGD);  Surgeon: Florencia Reasons, MD;  Location[facility] ENDOSCOPY;  Service: Endoscopy;  Laterality: N/A;  push peg  . Peg placement  04/13/2012    Procedure: PERCUTANEOUS ENDOSCOPIC GASTROSTOMY (PEG) PLACEMENT;  Surgeon: Florencia Reasons, MD;  Location: [facility] ENDOSCOPY;  Service: Endoscopy;  Laterality: N/A;    Family History  Problem Relation Age of Onset  . Heart failure Mother   . Aneurysm Sister   . Suicidality Brother   . Heart failure Brother   . Diabetes Sister     No Known Allergies  Current Outpatient Prescriptions on File Prior to Visit  Medication Sig Dispense Refill  . Alcohol Swabs (ALCOHOL PADS) 70 % PADS       . aspirin 81 MG tablet Take 81 mg by mouth daily.      . Blood Glucose Monitoring Suppl (EASY PLUS BLOOD GLUCOSE SYSTEM) DEVI       . EASY COMFORT LANCETS MISC       . EASY PLUS II GLUCOSE TEST test  strip       . ferrous sulfate 325 (65 FE) MG tablet Take 325 mg by mouth daily with breakfast.      . metoprolol (LOPRESSOR) 50 MG tablet Take 1.5 tablets (75 mg total) by mouth 2 (two) times daily.  180 tablet  3  . Multiple Vitamin (MULTIVITAMIN) tablet Take 1 tablet by mouth daily.      . tamsulosin (FLOMAX) 0.4 MG CAPS capsule Take 1 capsule (0.4 mg total) by mouth daily.  90 capsule  3   No current facility-administered medications on file prior to visit.    BP 110/80  Temp(Src) 98.1 F (36.7 C) (Oral)  Wt 184 lb (83.462 kg)      Review of Systems  Constitutional: Positive for fatigue. Negative for fever, chills and appetite change.  HENT: Negative for congestion, dental problem, ear pain, hearing loss, sore throat, tinnitus, trouble swallowing and voice change.     Eyes: Negative for pain, discharge and visual disturbance.  Respiratory: Negative for cough, chest tightness, wheezing and stridor.   Cardiovascular: Negative for chest pain, palpitations and leg swelling.  Gastrointestinal: Negative for nausea, vomiting, abdominal pain, diarrhea, constipation, blood in stool and abdominal distention.  Genitourinary: Negative for urgency, hematuria, flank pain, discharge, difficulty urinating and genital sores.  Musculoskeletal: Positive for gait problem. Negative for arthralgias, back pain, joint swelling, myalgias and neck stiffness.  Skin: Negative for rash.  Neurological: Positive for weakness. Negative for dizziness, syncope, speech difficulty, numbness and headaches.  Hematological: Negative for adenopathy. Does not bruise/bleed easily.  Psychiatric/Behavioral: Negative for behavioral problems and dysphoric mood. The patient is not nervous/anxious.        Objective:   Physical Exam  Constitutional: He is oriented to person, place, and time. He appears well-developed.  HENT:  Head: Normocephalic.  Right Ear: External ear normal.  Left Ear: External ear normal.  Eyes: Conjunctivae and EOM are normal.  Neck: Normal range of motion.  Cardiovascular: Normal rate and normal heart sounds.   Pulmonary/Chest: Breath sounds normal.  Abdominal: Bowel sounds are normal.  Musculoskeletal: Normal range of motion. He exhibits no edema and no tenderness.  Neurological: He is alert and oriented to person, place, and time.  Psychiatric: He has a normal mood and affect. His behavior is normal.          Assessment & Plan:   Deconditioning Diabetes we'll check a hemoglobin A1c and urine for microalbumin Hypertension well controlled COPD History acute renal failure History of alcoholism  We'll resume home PT

## 2013-04-19 LAB — BASIC METABOLIC PANEL
Calcium: 9.5 mg/dL (ref 8.4–10.5)
Creatinine, Ser: 1.3 mg/dL (ref 0.4–1.5)
Sodium: 142 mEq/L (ref 135–145)

## 2013-04-19 LAB — HEMOGLOBIN A1C: Hgb A1c MFr Bld: 6.4 % (ref 4.6–6.5)

## 2013-04-19 LAB — MICROALBUMIN / CREATININE URINE RATIO: Microalb, Ur: 44.9 mg/dL — ABNORMAL HIGH (ref 0.0–1.9)

## 2013-05-26 ENCOUNTER — Telehealth: Payer: Self-pay | Admitting: Internal Medicine

## 2013-05-26 NOTE — Telephone Encounter (Signed)
Tried to call back and after holding for 10 minutes I was disconnected.

## 2013-05-26 NOTE — Telephone Encounter (Signed)
[facility] following up on back brace the pt ordered and they states they faxed in. Ref: [id number] Tobi Bastosanna

## 2013-06-02 NOTE — Telephone Encounter (Signed)
Please see the note below.  They are requesting a callback and would like to know the status of the back brace request. They are re-faxing the form.

## 2013-06-02 NOTE — Telephone Encounter (Signed)
Back Brace not ordered by Dr. Kirtland BouchardK or patient. They were told that the other day by [place].

## 2013-06-17 ENCOUNTER — Telehealth: Payer: Self-pay | Admitting: Internal Medicine

## 2013-06-17 MED ORDER — GLUCOSE BLOOD VI STRP: ORAL_STRIP | Status: DC

## 2013-06-17 NOTE — Telephone Encounter (Signed)
Rx sent to pharmacy   

## 2013-06-17 NOTE — Telephone Encounter (Signed)
Pt needs refill of EASY PLUS II GLUCOSE TEST test strip Pt is out and needs asap. [facility] [address]

## 2013-06-20 ENCOUNTER — Telehealth: Payer: Self-pay | Admitting: Internal Medicine

## 2013-06-20 MED ORDER — GLUCOSE BLOOD VI STRP: 1.0000 | ORAL_STRIP | Freq: Every day | Status: DC | PRN

## 2013-06-20 MED ORDER — ACCU-CHEK SOFTCLIX LANCETS MISC: 1.0000 | Freq: Every day | Status: DC | PRN

## 2013-06-20 NOTE — Telephone Encounter (Signed)
Left detailed message for daughter that I have a meter here and Rx's sent to pharmacy for Test strips and Lancets.

## 2013-06-20 NOTE — Telephone Encounter (Signed)
Spoke to pt told him I have a meter here for him to pickup and I sent Rx's to pharmacy for Test strips and Lancets. Also told pt I left message for daughter. Pt verbalized understanding and stated he will have someone pickup meter for him. Told him okay it will be at the front desk. Pt verbalized understanding.

## 2013-06-20 NOTE — Telephone Encounter (Signed)
Daughter called for refill of strips on Friday and they do not carry that brand anymore (EASY PLUS II GLUCOSE TEST test strip) Pt will need new meter and rx for strips Accu check [facility] Pt has not been able to check all weekend.

## 2013-08-18 ENCOUNTER — Encounter: Payer: Self-pay | Admitting: Internal Medicine

## 2013-08-18 ENCOUNTER — Ambulatory Visit (INDEPENDENT_AMBULATORY_CARE_PROVIDER_SITE_OTHER): Payer: Medicare Other | Admitting: Internal Medicine

## 2013-08-18 VITALS — BP 134/80 | HR 69 | Temp 97.5°F | Resp 20 | Ht 70.5 in | Wt 194.0 lb

## 2013-08-18 DIAGNOSIS — I10 Essential (primary) hypertension: Secondary | ICD-10-CM

## 2013-08-18 DIAGNOSIS — D638 Anemia in other chronic diseases classified elsewhere: Secondary | ICD-10-CM

## 2013-08-18 DIAGNOSIS — R5383 Other fatigue: Secondary | ICD-10-CM

## 2013-08-18 DIAGNOSIS — I4891 Unspecified atrial fibrillation: Secondary | ICD-10-CM

## 2013-08-18 DIAGNOSIS — R5381 Other malaise: Secondary | ICD-10-CM

## 2013-08-18 DIAGNOSIS — R531 Weakness: Secondary | ICD-10-CM

## 2013-08-18 DIAGNOSIS — E119 Type 2 diabetes mellitus without complications: Secondary | ICD-10-CM

## 2013-08-18 DIAGNOSIS — I2510 Atherosclerotic heart disease of native coronary artery without angina pectoris: Secondary | ICD-10-CM

## 2013-08-18 NOTE — Progress Notes (Signed)
Subjective:    Patient ID: Johnny [name], male    DOB: [birth year], 79 y.o.   MRN: [id number]  HPI  79 year old patient who is seen today for followup.  He has a history of diabetes, which is now controlled off medication.  His last hemoglobin A1c was in a nondiabetic range. He has done quite well.  Has been hospitalized in the past for multi-organ failure and has done remarkably well. Complaints include weakness and occasional leg cramps.  He does have a history of electrolyte abnormalities. He has coronary artery disease, history of paroxysmal atrial fibrillation, and COPD.  Past Medical History  Diagnosis Date  . Acute alcoholic hepatitis 05/20/2010  . Anemia of other chronic disease 08/16/2007  . Atrial fibrillation 08/12/2007  . CORONARY ARTERY DISEASE 12/11/2006  . HYPERTENSION 12/11/2006  . Hyperlipidemia   . ETOH abuse   . Chronic low back pain   . Pneumonia     "now; never before" (04/01/2012)  . Hallucination 04/01/2012  . Exertional dyspnea   . DIABETES MELLITUS, TYPE II 12/11/2006  . Arthritis     "think that's what's in my back" (04/01/2012)  . Altered mental status 04/01/2012  . Seizures 04/01/2012    seizure-like activity @ home today/notes 04/01/2012     History   Social History  . Marital Status: Married    Spouse Name: N/A    Number of Children: N/A  . Years of Education: N/A   Occupational History  . Not on file.   Social History Main Topics  . Smoking status: Former Smoker -- 0.50 packs/day for 17 years    Types: Cigarettes    Quit date: 04/22/1983  . Smokeless tobacco: Never Used     Comment: 04/01/2012 "quit smoking 20+ yr ago"  . Alcohol Use: 10.8 oz/week    2 Glasses of wine, 16 Shots of liquor per week     Comment: hx of ETOH abuse; 04/01/2012 "drink ~ 1 pint//wk; vodka; cup of wine/wk"   . Drug Use: No  . Sexual Activity: No   Other Topics Concern  . Not on file   Social History Narrative  . No narrative on file    Past Surgical History    Procedure Laterality Date  . Coronary artery bypass graft  ~ 2003    CABG X4  . Cataract extraction w/ intraocular lens  implant, bilateral      "years ago" (04/01/2012)  . Esophagogastroduodenoscopy  04/13/2012    Procedure: ESOPHAGOGASTRODUODENOSCOPY (EGD);  Surgeon: Florencia Reasonsobert V Buccini, MD;  [facility];  Service: Endoscopy;  Laterality: N/A;  push peg  . Peg placement  04/13/2012    Procedure: PERCUTANEOUS ENDOSCOPIC GASTROSTOMY (PEG) PLACEMENT;  Surgeon: Florencia Reasonsobert V Buccini, MD;  Location: [facility] ENDOSCOPY;  Service: Endoscopy;  Laterality: N/A;    Family History  Problem Relation Age of Onset  . Heart failure Mother   . Aneurysm Sister   . Suicidality Brother   . Heart failure Brother   . Diabetes Sister     No Known Allergies  Current Outpatient Prescriptions on File Prior to Visit  Medication Sig Dispense Refill  . ACCU-CHEK SOFTCLIX LANCETS lancets 1 each by Other route daily as needed for other.  100 each  12  . Alcohol Swabs (ALCOHOL PADS) 70 % PADS       . aspirin 81 MG tablet Take 81 mg by mouth daily.      . Blood Glucose Monitoring Suppl (EASY PLUS BLOOD GLUCOSE SYSTEM) DEVI       .  ferrous sulfate 325 (65 FE) MG tablet Take 325 mg by mouth daily with breakfast.      . glucose blood (ACCU-CHEK AVIVA PLUS) test strip 1 each by Other route daily as needed for other.  100 each  12  . metoprolol (LOPRESSOR) 50 MG tablet Take 1.5 tablets (75 mg total) by mouth 2 (two) times daily.  180 tablet  3  . Multiple Vitamin (MULTIVITAMIN) tablet Take 1 tablet by mouth daily.      . tamsulosin (FLOMAX) 0.4 MG CAPS capsule Take 1 capsule (0.4 mg total) by mouth daily.  90 capsule  3   No current facility-administered medications on file prior to visit.    BP 134/80  Pulse 69  Temp(Src) 97.5 F (36.4 C) (Oral)  Resp 20  Ht 5' 10.5" (1.791 m)  Wt 194 lb (87.998 kg)  BMI 27.43 kg/m2  SpO2 98%   Results for orders placed in visit on 04/18/13  MICROALBUMIN / CREATININE  URINE RATIO      Result Value Ref Range   Microalb, Ur 44.9 (*) 0.0 - 1.9 mg/dL   Creatinine,U 960147.6     Microalb Creat Ratio 30.4 (*) 0.0 - 30.0 mg/g  HEMOGLOBIN A1C      Result Value Ref Range   Hemoglobin A1C 6.4  4.6 - 6.5 %  BASIC METABOLIC PANEL      Result Value Ref Range   Sodium 142  135 - 145 mEq/L   Potassium 4.6  3.5 - 5.1 mEq/L   Chloride 109  96 - 112 mEq/L   CO2 27  19 - 32 mEq/L   Glucose, Bld 108 (*) 70 - 99 mg/dL   BUN 26 (*) 6 - 23 mg/dL   Creatinine, Ser 1.3  0.4 - 1.5 mg/dL   Calcium 9.5  8.4 - 45.410.5 mg/dL   GFR 09.8168.34  >19.14>60.00 mL/min      Review of Systems  Constitutional: Negative for fever, chills, appetite change and fatigue.  HENT: Negative for congestion, dental problem, ear pain, hearing loss, sore throat, tinnitus, trouble swallowing and voice change.   Eyes: Negative for pain, discharge and visual disturbance.  Respiratory: Negative for cough, chest tightness, wheezing and stridor.   Cardiovascular: Negative for chest pain, palpitations and leg swelling.  Gastrointestinal: Negative for nausea, vomiting, abdominal pain, diarrhea, constipation, blood in stool and abdominal distention.  Genitourinary: Negative for urgency, hematuria, flank pain, discharge, difficulty urinating and genital sores.  Musculoskeletal: Negative for arthralgias, back pain, gait problem, joint swelling, myalgias and neck stiffness.  Skin: Negative for rash.  Neurological: Positive for weakness and light-headedness. Negative for dizziness, syncope, speech difficulty, numbness and headaches.  Hematological: Negative for adenopathy. Does not bruise/bleed easily.  Psychiatric/Behavioral: Negative for behavioral problems and dysphoric mood. The patient is not nervous/anxious.        Objective:   Physical Exam  Constitutional: He is oriented to person, place, and time. He appears well-developed.  Appears weak and chronically ill Uses walker for ambulation  HENT:  Head:  Normocephalic.  Right Ear: External ear normal.  Left Ear: External ear normal.  Eyes: Conjunctivae and EOM are normal.  Neck: Normal range of motion.  Cardiovascular: Normal rate, regular rhythm and normal heart sounds.   Pulmonary/Chest: Effort normal and breath sounds normal.  Abdominal: Bowel sounds are normal.  Musculoskeletal: Normal range of motion. He exhibits no edema and no tenderness.  Neurological: He is alert and oriented to person, place, and time.  Psychiatric: He has a  normal mood and affect. His behavior is normal.          Assessment & Plan:    CAD DM2 HTN PAF   Will check updated lab

## 2013-08-18 NOTE — Progress Notes (Signed)
Pre-visit discussion using our clinic review tool. No additional management support is needed unless otherwise documented below in the visit note.  

## 2013-08-18 NOTE — Patient Instructions (Signed)
Decrease metoprolol to 50 mg  (1 tablet) twice daily  Limit your sodium (Salt) intake  Return in 3 months for follow-up   Please check your hemoglobin A1c every 3 months  Muscle Cramps and Spasms Muscle cramps and spasms are when muscles tighten by themselves. They usually get better within minutes. Muscle cramps are painful. They are usually stronger and last longer than muscle spasms. Muscle spasms may or may not be painful. They can last a few seconds or much longer. HOME CARE  Drink enough fluid to keep your pee (urine) clear or pale yellow.  Massage, stretch, and relax the muscle.  Use a warm towel, heating pad, or warm shower water on tight muscles.  Place ice on the muscle if it is tender or in pain.  Put ice in a plastic bag.  Place a towel between your skin and the bag.  Leave the ice on for 15-20 minutes, 03-04 times a day.  Only take medicine as told by your doctor. GET HELP RIGHT AWAY IF:  Your cramps or spasms get worse, happen more often, or do not get better with time. MAKE SURE YOU:  Understand these instructions.  Will watch your condition.  Will get help right away if you are not doing well or get worse. Document Released: 03/20/2008 Document Revised: 08/02/2012 Document Reviewed: 03/24/2012 [facility] Patient Information 2014 [employer], [place].

## 2013-08-19 LAB — CBC WITH DIFFERENTIAL/PLATELET
BASOS ABS: 0 10*3/uL (ref 0.0–0.1)
Basophils Relative: 0.2 % (ref 0.0–3.0)
EOS ABS: 0.2 10*3/uL (ref 0.0–0.7)
Eosinophils Relative: 2.3 % (ref 0.0–5.0)
HCT: 35.2 % — ABNORMAL LOW (ref 39.0–52.0)
Hemoglobin: 11.9 g/dL — ABNORMAL LOW (ref 13.0–17.0)
LYMPHS PCT: 42.9 % (ref 12.0–46.0)
Lymphs Abs: 3.5 10*3/uL (ref 0.7–4.0)
MCHC: 33.8 g/dL (ref 30.0–36.0)
MCV: 94.6 fl (ref 78.0–100.0)
MONOS PCT: 10.4 % (ref 3.0–12.0)
Monocytes Absolute: 0.8 10*3/uL (ref 0.1–1.0)
Neutro Abs: 3.6 10*3/uL (ref 1.4–7.7)
Neutrophils Relative %: 44.2 % (ref 43.0–77.0)
Platelets: 129 10*3/uL — ABNORMAL LOW (ref 150.0–400.0)
RBC: 3.72 Mil/uL — ABNORMAL LOW (ref 4.22–5.81)
RDW: 13.9 % (ref 11.5–14.6)
WBC: 8.1 10*3/uL (ref 4.5–10.5)

## 2013-08-19 LAB — COMPREHENSIVE METABOLIC PANEL
ALBUMIN: 3.8 g/dL (ref 3.5–5.2)
ALT: 13 U/L (ref 0–53)
AST: 17 U/L (ref 0–37)
Alkaline Phosphatase: 128 U/L — ABNORMAL HIGH (ref 39–117)
BUN: 29 mg/dL — AB (ref 6–23)
CO2: 26 meq/L (ref 19–32)
Calcium: 9.6 mg/dL (ref 8.4–10.5)
Chloride: 107 mEq/L (ref 96–112)
Creatinine, Ser: 1.5 mg/dL (ref 0.4–1.5)
GFR: 57.51 mL/min — AB (ref >60.00)
Glucose, Bld: 139 mg/dL — ABNORMAL HIGH (ref 70–99)
POTASSIUM: 5.1 meq/L (ref 3.5–5.1)
Sodium: 140 mEq/L (ref 135–145)
Total Bilirubin: 0.5 mg/dL (ref 0.3–1.2)
Total Protein: 7.7 g/dL (ref 6.0–8.3)

## 2013-08-19 LAB — TSH: TSH: 1.02 u[IU]/mL (ref 0.35–5.50)

## 2013-08-19 LAB — HEMOGLOBIN A1C: Hgb A1c MFr Bld: 6.8 % — ABNORMAL HIGH (ref 4.6–6.5)

## 2013-08-30 ENCOUNTER — Telehealth: Payer: Self-pay

## 2013-08-30 NOTE — Telephone Encounter (Signed)
Relevant patient education mailed to patient.  

## 2013-11-04 ENCOUNTER — Other Ambulatory Visit: Payer: Self-pay | Admitting: Internal Medicine

## 2013-11-17 ENCOUNTER — Ambulatory Visit: Payer: Medicare Other | Admitting: Internal Medicine

## 2013-11-21 ENCOUNTER — Ambulatory Visit: Payer: Medicare Other | Admitting: Internal Medicine

## 2013-11-22 ENCOUNTER — Ambulatory Visit (INDEPENDENT_AMBULATORY_CARE_PROVIDER_SITE_OTHER): Payer: Medicare Other | Admitting: Internal Medicine

## 2013-11-22 ENCOUNTER — Encounter: Payer: Self-pay | Admitting: Internal Medicine

## 2013-11-22 VITALS — HR 67 | Temp 97.9°F | Resp 20 | Ht 70.5 in | Wt 188.0 lb

## 2013-11-22 DIAGNOSIS — I10 Essential (primary) hypertension: Secondary | ICD-10-CM

## 2013-11-22 DIAGNOSIS — I4892 Unspecified atrial flutter: Secondary | ICD-10-CM

## 2013-11-22 DIAGNOSIS — I2510 Atherosclerotic heart disease of native coronary artery without angina pectoris: Secondary | ICD-10-CM

## 2013-11-22 DIAGNOSIS — E119 Type 2 diabetes mellitus without complications: Secondary | ICD-10-CM

## 2013-11-22 MED ORDER — METOPROLOL TARTRATE 50 MG PO TABS: 50.0000 mg | ORAL_TABLET | Freq: Two times a day (BID) | ORAL | Status: DC

## 2013-11-22 MED ORDER — ATORVASTATIN CALCIUM 20 MG PO TABS: 20.0000 mg | ORAL_TABLET | Freq: Every day | ORAL | Status: DC

## 2013-11-22 NOTE — Progress Notes (Signed)
Pre visit review using our clinic review tool, if applicable. No additional management support is needed unless otherwise documented below in the visit note. 

## 2013-11-22 NOTE — Progress Notes (Signed)
Subjective:    Patient ID: Johnny [name], male    DOB: Aug 26, 1934, 79 y.o.   MRN: [id number]  HPI  79 year old patient who is seen today for followup of multiple medical problems.  He was hospitalized in January of last year for multi-organ failure.  He required prolonged ventilatory support with tracheostomy was discharged with a PEG.  He has a history of diabetes, which has more recently been diet controlled.  He no longer is on statin therapy.  He does have a history of coronary artery disease status post CABG.  He has a history of hypertension, but blood pressure is inaudible.  Today.  He generally has done well.  Denies any cardiopulmonary complaints Ambulatory with a walker  Past Medical History  Diagnosis Date  . Acute alcoholic hepatitis 05/20/2010  . Anemia of other chronic disease 08/16/2007  . Atrial fibrillation 08/12/2007  . CORONARY ARTERY DISEASE 12/11/2006  . HYPERTENSION 12/11/2006  . Hyperlipidemia   . ETOH abuse   . Chronic low back pain   . Pneumonia     "now; never before" (04/01/2012)  . Hallucination 04/01/2012  . Exertional dyspnea   . DIABETES MELLITUS, TYPE II 12/11/2006  . Arthritis     "think that's what's in my back" (04/01/2012)  . Altered mental status 04/01/2012  . Seizures 04/01/2012    seizure-like activity @ home today/notes 04/01/2012     History   Social History  . Marital Status: Married    Spouse Name: N/A    Number of Children: N/A  . Years of Education: N/A   Occupational History  . Not on file.   Social History Main Topics  . Smoking status: Former Smoker -- 0.50 packs/day for 17 years    Types: Cigarettes    Quit date: 04/22/1983  . Smokeless tobacco: Never Used     Comment: 04/01/2012 "quit smoking 20+ yr ago"  . Alcohol Use: 10.8 oz/week    2 Glasses of wine, 16 Shots of liquor per week     Comment: hx of ETOH abuse; 04/01/2012 "drink ~ 1 pint//wk; vodka; cup of wine/wk"   . Drug Use: No  . Sexual Activity: No   Other Topics  Concern  . Not on file   Social History Narrative  . No narrative on file    Past Surgical History  Procedure Laterality Date  . Coronary artery bypass graft  ~ 2003    CABG X4  . Cataract extraction w/ intraocular lens  implant, bilateral      "years ago" (04/01/2012)  . Esophagogastroduodenoscopy  04/13/2012    Procedure: ESOPHAGOGASTRODUODENOSCOPY (EGD);  Surgeon: Florencia Reasonsobert V Buccini, MD;  [facility];  Service: Endoscopy;  Laterality: N/A;  push peg  . Peg placement  04/13/2012    Procedure: PERCUTANEOUS ENDOSCOPIC GASTROSTOMY (PEG) PLACEMENT;  Surgeon: Florencia Reasonsobert V Buccini, MD;  Location: [facility] ENDOSCOPY;  Service: Endoscopy;  Laterality: N/A;    Family History  Problem Relation Age of Onset  . Heart failure Mother   . Aneurysm Sister   . Suicidality Brother   . Heart failure Brother   . Diabetes Sister     No Known Allergies  Current Outpatient Prescriptions on File Prior to Visit  Medication Sig Dispense Refill  . ACCU-CHEK SOFTCLIX LANCETS lancets 1 each by Other route daily as needed for other.  100 each  12  . Alcohol Swabs (ALCOHOL PADS) 70 % PADS       . aspirin 81 MG tablet Take 81  mg by mouth daily.      . ferrous sulfate 325 (65 FE) MG tablet Take 325 mg by mouth daily with breakfast.      . glucose blood (ACCU-CHEK AVIVA PLUS) test strip 1 each by Other route daily as needed for other.  100 each  12  . metoprolol (LOPRESSOR) 50 MG tablet TAKE 1.5 TABLETS (75 MG TOTAL) BY MOUTH 2 (TWO) TIMES DAILY.  180 tablet  1  . Multiple Vitamin (MULTIVITAMIN) tablet Take 1 tablet by mouth daily.      . tamsulosin (FLOMAX) 0.4 MG CAPS capsule Take 1 capsule (0.4 mg total) by mouth daily.  90 capsule  3   No current facility-administered medications on file prior to visit.    Pulse 67  Temp(Src) 97.9 F (36.6 C) (Oral)  Resp 20  Ht 5' 10.5" (1.791 m)  Wt 188 lb (85.276 kg)  BMI 26.58 kg/m2  SpO2 95%    Review of Systems  Constitutional: Negative for fever,  chills, appetite change and fatigue.  HENT: Negative for congestion, dental problem, ear pain, hearing loss, sore throat, tinnitus, trouble swallowing and voice change.   Eyes: Negative for pain, discharge and visual disturbance.  Respiratory: Negative for cough, chest tightness, wheezing and stridor.   Cardiovascular: Negative for chest pain, palpitations and leg swelling.  Gastrointestinal: Negative for nausea, vomiting, abdominal pain, diarrhea, constipation, blood in stool and abdominal distention.  Genitourinary: Negative for urgency, hematuria, flank pain, discharge, difficulty urinating and genital sores.  Musculoskeletal: Positive for gait problem. Negative for arthralgias, back pain, joint swelling, myalgias and neck stiffness.  Skin: Negative for rash.  Neurological: Positive for weakness. Negative for dizziness, syncope, speech difficulty, numbness and headaches.  Hematological: Negative for adenopathy. Does not bruise/bleed easily.  Psychiatric/Behavioral: Negative for behavioral problems and dysphoric mood. The patient is not nervous/anxious.        Objective:   Physical Exam  Constitutional: He is oriented to person, place, and time. He appears well-developed.  Chronically ill Alert No distress Ambulatory with a walker Blood pressure is inaudible in both arms  HENT:  Head: Normocephalic.  Right Ear: External ear normal.  Left Ear: External ear normal.  Eyes: Conjunctivae and EOM are normal.  Neck: Normal range of motion.  Cardiovascular: Normal rate and normal heart sounds.   Pulmonary/Chest: Breath sounds normal.  Abdominal: Bowel sounds are normal.  Musculoskeletal: Normal range of motion. He exhibits no edema and no tenderness.  Neurological: He is alert and oriented to person, place, and time.  Psychiatric: He has a normal mood and affect. His behavior is normal.          Assessment & Plan:   Diabetes mellitus.  We'll check a hemoglobin A1c Hypertension.   We'll continue beta blocker therapy Coronary artery disease.  We'll resume statin therapy History of alcoholism History of paroxysmal atrial fibrillation  Recheck 3 months

## 2013-11-22 NOTE — Patient Instructions (Signed)
Limit your sodium (Salt) intake   Please check your hemoglobin A1c every 3 months   

## 2013-11-23 LAB — LIPID PANEL
Cholesterol: 149 mg/dL (ref 0–200)
HDL: 32 mg/dL — AB (ref >39.00)
LDL CALC: 91 mg/dL (ref 0–99)
NonHDL: 117
TRIGLYCERIDES: 132 mg/dL (ref 0.0–149.0)
Total CHOL/HDL Ratio: 5
VLDL: 26.4 mg/dL (ref 0.0–40.0)

## 2013-11-23 LAB — HEMOGLOBIN A1C: HEMOGLOBIN A1C: 6.3 % (ref 4.6–6.5)

## 2013-11-23 NOTE — Addendum Note (Signed)
Addended by: Bonnye FavaKWEI, [place] Silveria K on: 11/23/2013 10:46 AM   Modules accepted: Orders

## 2014-01-01 ENCOUNTER — Other Ambulatory Visit: Payer: Self-pay | Admitting: Internal Medicine

## 2014-02-23 ENCOUNTER — Ambulatory Visit: Payer: Medicare Other | Admitting: Internal Medicine

## 2014-02-28 ENCOUNTER — Other Ambulatory Visit: Payer: Self-pay | Admitting: Internal Medicine

## 2014-03-02 ENCOUNTER — Encounter: Payer: Self-pay | Admitting: Internal Medicine

## 2014-03-02 ENCOUNTER — Ambulatory Visit (INDEPENDENT_AMBULATORY_CARE_PROVIDER_SITE_OTHER): Payer: Medicare Other | Admitting: Internal Medicine

## 2014-03-02 ENCOUNTER — Ambulatory Visit (INDEPENDENT_AMBULATORY_CARE_PROVIDER_SITE_OTHER): Payer: Medicare Other | Admitting: *Deleted

## 2014-03-02 DIAGNOSIS — R531 Weakness: Secondary | ICD-10-CM

## 2014-03-02 DIAGNOSIS — I4892 Unspecified atrial flutter: Secondary | ICD-10-CM

## 2014-03-02 DIAGNOSIS — Z23 Encounter for immunization: Secondary | ICD-10-CM

## 2014-03-02 DIAGNOSIS — E1151 Type 2 diabetes mellitus with diabetic peripheral angiopathy without gangrene: Secondary | ICD-10-CM

## 2014-03-02 DIAGNOSIS — I10 Essential (primary) hypertension: Secondary | ICD-10-CM

## 2014-03-02 LAB — GLUCOSE, POCT (MANUAL RESULT ENTRY): POC Glucose: 138 mg/dl — AB (ref 70–99)

## 2014-03-02 NOTE — Patient Instructions (Signed)
Limit your sodium (Salt) intake  Physical therapy consultation as discussed  Return in 3 months for follow-up

## 2014-03-02 NOTE — Progress Notes (Signed)
Pre visit review using our clinic review tool, if applicable. No additional management support is needed unless otherwise documented below in the visit note. 

## 2014-03-02 NOTE — Progress Notes (Signed)
Subjective:    Patient ID: Johnny [name], male    DOB: Jul 25, 1934, 79 y.o.   MRN: [id number]  HPI  79 year old patient who is seen today for follow-up.  He has a history of diabetes now diet controlled.  He has a history of hypertension which has been quite stable.  He is on metoprolol only.  He does have a history of coronary artery disease as well as paroxysmal atrial fibrillation.  He was hospitalized about 11 months ago for multi-organ failure Complaints today include weakness of his left grip and decreased range of motion of his left shoulder. His cardiac status has been stable Hemoglobin A1c was in a nondiabetic range.  3 months ago No focal concerns or complaints.  He does walk with a walker.  His requested a physical therapy evaluation  handicap Placard signed  Past Medical History  Diagnosis Date  . Acute alcoholic hepatitis 05/20/2010  . Anemia of other chronic disease 08/16/2007  . Atrial fibrillation 08/12/2007  . CORONARY ARTERY DISEASE 12/11/2006  . HYPERTENSION 12/11/2006  . Hyperlipidemia   . ETOH abuse   . Chronic low back pain   . Pneumonia     "now; never before" (04/01/2012)  . Hallucination 04/01/2012  . Exertional dyspnea   . DIABETES MELLITUS, TYPE II 12/11/2006  . Arthritis     "think that's what's in my back" (04/01/2012)  . Altered mental status 04/01/2012  . Seizures 04/01/2012    seizure-like activity @ home today/notes 04/01/2012     History   Social History  . Marital Status: Married    Spouse Name: N/A    Number of Children: N/A  . Years of Education: N/A   Occupational History  . Not on file.   Social History Main Topics  . Smoking status: Former Smoker -- 0.50 packs/day for 17 years    Types: Cigarettes    Quit date: 04/22/1983  . Smokeless tobacco: Never Used     Comment: 04/01/2012 "quit smoking 20+ yr ago"  . Alcohol Use: 10.8 oz/week    2 Glasses of wine, 16 Shots of liquor per week     Comment: hx of ETOH abuse; 04/01/2012 "drink ~  1 pint//wk; vodka; cup of wine/wk"   . Drug Use: No  . Sexual Activity: No   Other Topics Concern  . Not on file   Social History Narrative    Past Surgical History  Procedure Laterality Date  . Coronary artery bypass graft  ~ 2003    CABG X4  . Cataract extraction w/ intraocular lens  implant, bilateral      "years ago" (04/01/2012)  . Esophagogastroduodenoscopy  04/13/2012    Procedure: ESOPHAGOGASTRODUODENOSCOPY (EGD);  Surgeon: Florencia Reasonsobert V Buccini, MD;  [facility];  Service: Endoscopy;  Laterality: N/A;  push peg  . Peg placement  04/13/2012    Procedure: PERCUTANEOUS ENDOSCOPIC GASTROSTOMY (PEG) PLACEMENT;  Surgeon: Florencia Reasonsobert V Buccini, MD;  Location: [facility] ENDOSCOPY;  Service: Endoscopy;  Laterality: N/A;    Family History  Problem Relation Age of Onset  . Heart failure Mother   . Aneurysm Sister   . Suicidality Brother   . Heart failure Brother   . Diabetes Sister     No Known Allergies  Current Outpatient Prescriptions on File Prior to Visit  Medication Sig Dispense Refill  . ACCU-CHEK SOFTCLIX LANCETS lancets 1 each by Other route daily as needed for other. 100 each 12  . Alcohol Swabs (ALCOHOL PADS) 70 % PADS     .  aspirin 81 MG tablet Take 81 mg by mouth daily.    Marland Kitchen. atorvastatin (LIPITOR) 20 MG tablet Take 1 tablet (20 mg total) by mouth daily. 90 tablet 3  . glucose blood (ACCU-CHEK AVIVA PLUS) test strip 1 each by Other route daily as needed for other. 100 each 12  . metoprolol (LOPRESSOR) 50 MG tablet TAKE 1.5 TABLETS (75 MG TOTAL) BY MOUTH 2 (TWO) TIMES DAILY. 180 tablet 1  . Multiple Vitamin (MULTIVITAMIN) tablet Take 1 tablet by mouth daily.    . tamsulosin (FLOMAX) 0.4 MG CAPS capsule TAKE 1 CAPSULE (0.4 MG TOTAL) BY MOUTH DAILY. 90 capsule 3   No current facility-administered medications on file prior to visit.    BP 126/80 mmHg  Pulse 74  Temp(Src) 98 F (36.7 C) (Oral)  Resp 20  Ht 5' 10.5" (1.791 m)  Wt 182 lb (82.555 kg)  BMI 25.74  kg/m2     Review of Systems  Constitutional: Negative for fever, chills, appetite change and fatigue.  HENT: Negative for congestion, dental problem, ear pain, hearing loss, sore throat, tinnitus, trouble swallowing and voice change.   Eyes: Negative for pain, discharge and visual disturbance.  Respiratory: Negative for cough, chest tightness, wheezing and stridor.   Cardiovascular: Negative for chest pain, palpitations and leg swelling.  Gastrointestinal: Negative for nausea, vomiting, abdominal pain, diarrhea, constipation, blood in stool and abdominal distention.  Genitourinary: Negative for urgency, hematuria, flank pain, discharge, difficulty urinating and genital sores.  Musculoskeletal: Positive for arthralgias, gait problem and neck stiffness. Negative for myalgias, back pain and joint swelling.  Skin: Negative for rash.  Neurological: Negative for dizziness, syncope, speech difficulty, weakness, numbness and headaches.  Hematological: Negative for adenopathy. Does not bruise/bleed easily.  Psychiatric/Behavioral: Negative for behavioral problems and dysphoric mood. The patient is not nervous/anxious.        Objective:   Physical Exam  Constitutional: He is oriented to person, place, and time. He appears well-developed.  Blood pressure 126/80 on arrival, after ambulation Blood pressure inaudible by my exam  HENT:  Head: Normocephalic.  Right Ear: External ear normal.  Left Ear: External ear normal.  Eyes: Conjunctivae and EOM are normal.  Neck: Normal range of motion.  Cardiovascular: Normal rate, regular rhythm and normal heart sounds.   Pulmonary/Chest: Breath sounds normal.  Abdominal: Bowel sounds are normal.  Musculoskeletal: Normal range of motion. He exhibits no edema or tenderness.  Decreased range of motion left shoulder  Neurological: He is alert and oriented to person, place, and time.  Decreased left grip strength  Skin:  Healing, clean superficial abrasion  over the right anterior tibia  Psychiatric: He has a normal mood and affect. His behavior is normal.          Assessment & Plan:   Diabetes mellitus.  Remains controlled off medication Hypertension, stable Coronary artery disease, remains asymptomatic Gen. Debility.  We will obtain a physical therapy consult for gait training and general strengthening

## 2014-03-08 ENCOUNTER — Telehealth: Payer: Self-pay | Admitting: Internal Medicine

## 2014-03-08 NOTE — Telephone Encounter (Signed)
emmi mailed  °

## 2014-04-11 ENCOUNTER — Other Ambulatory Visit: Payer: Self-pay | Admitting: Internal Medicine

## 2014-06-02 ENCOUNTER — Ambulatory Visit: Payer: Medicare Other | Admitting: Internal Medicine

## 2014-06-09 ENCOUNTER — Encounter: Payer: Self-pay | Admitting: Internal Medicine

## 2014-06-09 ENCOUNTER — Ambulatory Visit (INDEPENDENT_AMBULATORY_CARE_PROVIDER_SITE_OTHER): Payer: Commercial Managed Care - HMO | Admitting: Internal Medicine

## 2014-06-09 VITALS — BP 160/80 | HR 56 | Temp 98.0°F | Wt 181.0 lb

## 2014-06-09 DIAGNOSIS — I2510 Atherosclerotic heart disease of native coronary artery without angina pectoris: Secondary | ICD-10-CM

## 2014-06-09 DIAGNOSIS — E1151 Type 2 diabetes mellitus with diabetic peripheral angiopathy without gangrene: Secondary | ICD-10-CM | POA: Diagnosis not present

## 2014-06-09 DIAGNOSIS — I480 Paroxysmal atrial fibrillation: Secondary | ICD-10-CM

## 2014-06-09 DIAGNOSIS — I10 Essential (primary) hypertension: Secondary | ICD-10-CM | POA: Diagnosis not present

## 2014-06-09 LAB — HEMOGLOBIN A1C: Hgb A1c MFr Bld: 6.2 % (ref 4.6–6.5)

## 2014-06-09 NOTE — Progress Notes (Signed)
Subjective:    Patient ID: Johnny [name], male    DOB: Jan 10, 1935, 80 y.o.   MRN: [id number]  HPI 80 year old patient who is seen for follow-up.  He is accompanied by his daughter.  Medical problems include type 2 diabetes which is now diet controlled.  He has essential hypertension, coronary artery disease.  He is doing quite well. Ambulatory with a walker.  No recent falls    Past Medical History  Diagnosis Date  . Acute alcoholic hepatitis 05/20/2010  . Anemia of other chronic disease 08/16/2007  . Atrial fibrillation 08/12/2007  . CORONARY ARTERY DISEASE 12/11/2006  . HYPERTENSION 12/11/2006  . Hyperlipidemia   . ETOH abuse   . Chronic low back pain   . Pneumonia     "now; never before" (04/01/2012)  . Hallucination 04/01/2012  . Exertional dyspnea   . DIABETES MELLITUS, TYPE II 12/11/2006  . Arthritis     "think that's what's in my back" (04/01/2012)  . Altered mental status 04/01/2012  . Seizures 04/01/2012    seizure-like activity @ home today/notes 04/01/2012     History   Social History  . Marital Status: Married    Spouse Name: N/A  . Number of Children: N/A  . Years of Education: N/A   Occupational History  . Not on file.   Social History Main Topics  . Smoking status: Former Smoker -- 0.50 packs/day for 17 years    Types: Cigarettes    Quit date: 04/22/1983  . Smokeless tobacco: Never Used     Comment: 04/01/2012 "quit smoking 20+ yr ago"  . Alcohol Use: 10.8 oz/week    2 Glasses of wine, 16 Shots of liquor per week     Comment: hx of ETOH abuse; 04/01/2012 "drink ~ 1 pint//wk; vodka; cup of wine/wk"   . Drug Use: No  . Sexual Activity: No   Other Topics Concern  . Not on file   Social History Narrative    Past Surgical History  Procedure Laterality Date  . Coronary artery bypass graft  ~ 2003    CABG X4  . Cataract extraction w/ intraocular lens  implant, bilateral      "years ago" (04/01/2012)  . Esophagogastroduodenoscopy  04/13/2012   Procedure: ESOPHAGOGASTRODUODENOSCOPY (EGD);  Surgeon: Florencia Reasonsobert V Buccini, MD;  [facility];  Service: Endoscopy;  Laterality: N/A;  push peg  . Peg placement  04/13/2012    Procedure: PERCUTANEOUS ENDOSCOPIC GASTROSTOMY (PEG) PLACEMENT;  Surgeon: Florencia Reasonsobert V Buccini, MD;  Location: [facility] ENDOSCOPY;  Service: Endoscopy;  Laterality: N/A;    Family History  Problem Relation Age of Onset  . Heart failure Mother   . Aneurysm Sister   . Suicidality Brother   . Heart failure Brother   . Diabetes Sister     No Known Allergies  Current Outpatient Prescriptions on File Prior to Visit  Medication Sig Dispense Refill  . ACCU-CHEK SOFTCLIX LANCETS lancets 1 each by Other route daily as needed for other. 100 each 12  . Alcohol Swabs (ALCOHOL PADS) 70 % PADS     . aspirin 81 MG tablet Take 81 mg by mouth daily.    Marland Kitchen. atorvastatin (LIPITOR) 20 MG tablet Take 1 tablet (20 mg total) by mouth daily. 90 tablet 3  . glucose blood (ACCU-CHEK AVIVA PLUS) test strip 1 each by Other route daily as needed for other. 100 each 12  . metoprolol (LOPRESSOR) 50 MG tablet TAKE 1.5 TABLETS (75 MG TOTAL) BY MOUTH 2 (TWO) TIMES  DAILY. 180 tablet 1  . Multiple Vitamin (MULTIVITAMIN) tablet Take 1 tablet by mouth daily.    . tamsulosin (FLOMAX) 0.4 MG CAPS capsule TAKE 1 CAPSULE (0.4 MG TOTAL) BY MOUTH DAILY. 90 capsule 3   No current facility-administered medications on file prior to visit.    BP 160/80 mmHg  Pulse 56  Temp(Src) 98 F (36.7 C) (Oral)  Wt 181 lb (82.101 kg)  SpO2 93%    Review of Systems  Constitutional: Positive for fatigue. Negative for fever, chills and appetite change.  HENT: Negative for congestion, dental problem, ear pain, hearing loss, sore throat, tinnitus, trouble swallowing and voice change.   Eyes: Negative for pain, discharge and visual disturbance.  Respiratory: Negative for cough, chest tightness, wheezing and stridor.   Cardiovascular: Negative for chest pain, palpitations  and leg swelling.  Gastrointestinal: Negative for nausea, vomiting, abdominal pain, diarrhea, constipation, blood in stool and abdominal distention.  Genitourinary: Negative for urgency, hematuria, flank pain, discharge, difficulty urinating and genital sores.  Musculoskeletal: Positive for gait problem. Negative for myalgias, back pain, joint swelling, arthralgias and neck stiffness.  Skin: Negative for rash.  Neurological: Positive for weakness. Negative for dizziness, syncope, speech difficulty, numbness and headaches.  Hematological: Negative for adenopathy. Does not bruise/bleed easily.  Psychiatric/Behavioral: Negative for behavioral problems and dysphoric mood. The patient is not nervous/anxious.        Objective:   Physical Exam  Constitutional: He is oriented to person, place, and time. He appears well-developed and well-nourished. No distress.  Thin.  No distress Blood pressure 160/80  HENT:  Head: Normocephalic.  Right Ear: External ear normal.  Left Ear: External ear normal.  Eyes: Conjunctivae and EOM are normal.  Neck: Normal range of motion.  Cardiovascular: Normal rate and normal heart sounds.   Pulmonary/Chest: Breath sounds normal.  Abdominal: Bowel sounds are normal.  Musculoskeletal: Normal range of motion. He exhibits no edema or tenderness.  Neurological: He is alert and oriented to person, place, and time.  Psychiatric: He has a normal mood and affect. His behavior is normal.          Assessment & Plan:   Diabetes mellitus.  Presently diet controlled.  We'll check a hemoglobin A1c Hypertension.  Fair control.  No change in therapy Dyslipidemia.  Continue statin therapy Coronary artery disease, stable  Recheck 6 months

## 2014-06-09 NOTE — Progress Notes (Signed)
Pre visit review using our clinic review tool, if applicable. No additional management support is needed unless otherwise documented below in the visit note. 

## 2014-06-09 NOTE — Patient Instructions (Signed)
Limit your sodium (Salt) intake  Please check your blood pressure on a regular basis.  If it is consistently greater than 150/90, please make an office appointment.  Return in 6 months for follow-up   

## 2014-07-28 ENCOUNTER — Telehealth: Payer: Self-pay | Admitting: Internal Medicine

## 2014-07-28 NOTE — Telephone Encounter (Signed)
Spoke to pt asked him how can I help you? Pt stated he wanted to have in home physical therapy again. Told pt you will need to be seen in order for Dr.K to order. Dr.K will not be back till May can schedule you to come in then. Pt said okay, transferred to scheduling.

## 2014-07-28 NOTE — Telephone Encounter (Signed)
Pt would like Johnny [name] calling him back concerning getting in home therapy

## 2014-08-31 ENCOUNTER — Encounter: Payer: Self-pay | Admitting: Internal Medicine

## 2014-08-31 ENCOUNTER — Ambulatory Visit (INDEPENDENT_AMBULATORY_CARE_PROVIDER_SITE_OTHER): Payer: Commercial Managed Care - HMO | Admitting: Internal Medicine

## 2014-08-31 VITALS — BP 150/90 | HR 96 | Temp 98.3°F | Resp 20 | Ht 70.5 in | Wt 178.0 lb

## 2014-08-31 DIAGNOSIS — I2510 Atherosclerotic heart disease of native coronary artery without angina pectoris: Secondary | ICD-10-CM | POA: Diagnosis not present

## 2014-08-31 DIAGNOSIS — I10 Essential (primary) hypertension: Secondary | ICD-10-CM

## 2014-08-31 DIAGNOSIS — R531 Weakness: Secondary | ICD-10-CM | POA: Diagnosis not present

## 2014-08-31 DIAGNOSIS — E1151 Type 2 diabetes mellitus with diabetic peripheral angiopathy without gangrene: Secondary | ICD-10-CM

## 2014-08-31 DIAGNOSIS — I480 Paroxysmal atrial fibrillation: Secondary | ICD-10-CM

## 2014-08-31 NOTE — Patient Instructions (Signed)
Limit your sodium (Salt) intake  Please check your blood pressure on a regular basis.  If it is consistently greater than 150/90, please make an office appointment.  Return in 3 months for follow-up  

## 2014-08-31 NOTE — Progress Notes (Signed)
Subjective:    Patient ID: Johnny [name], male    DOB: June 16, 1934, 80 y.o.   MRN: [id number]  HPI Wt Readings from Last 3 Encounters:  08/31/14 178 lb (80.74 kg)  06/09/14 181 lb (82.101 kg)  03/02/14 182 lb (82.555 kg)    Lab Results  Component Value Date   HGBA1C 6.2 06/09/2014   80 year old patient who is seen today for a face to face evaluation to assess need for in-home physical therapy. He lives alone but does have family close by and does have a CNA visit once or twice weekly He has a history of generalized weakness and protein calorie malnutrition.  He has a prior history of alcohol abuse.  He has significant cardiac disease which has been complicated by prior cardiac arrest. He has diabetes, but presently this has been diet controlled.  He does have a history of essential hypertension  BP Readings from Last 3 Encounters:  08/31/14 150/90  06/09/14 160/80  03/02/14 126/80   The patient remains quite weak and has  la [place]ona with assistance of a walker only.  He has developed general stiffness and has a difficult time raising his hands above his shoulders.  He requires assistance with combing his hair due to diminished range of motion.  No recent falls  Following his last hospital admission he was transferred to an extended care facility.  Following the return home.  He did benefit from in-home physical therapy at that time  Past Medical History  Diagnosis Date  . Acute alcoholic hepatitis 05/20/2010  . Anemia of other chronic disease 08/16/2007  . Atrial fibrillation 08/12/2007  . CORONARY ARTERY DISEASE 12/11/2006  . HYPERTENSION 12/11/2006  . Hyperlipidemia   . ETOH abuse   . Chronic low back pain   . Pneumonia     "now; never before" (04/01/2012)  . Hallucination 04/01/2012  . Exertional dyspnea   . DIABETES MELLITUS, TYPE II 12/11/2006  . Arthritis     "think that's what's in my back" (04/01/2012)  . Altered mental status 04/01/2012  . Seizures 04/01/2012   seizure-like activity @ home today/notes 04/01/2012     History   Social History  . Marital Status: Married    Spouse Name: N/A  . Number of Children: N/A  . Years of Education: N/A   Occupational History  . Not on file.   Social History Main Topics  . Smoking status: Former Smoker -- 0.50 packs/day for 17 years    Types: Cigarettes    Quit date: 04/22/1983  . Smokeless tobacco: Never Used     Comment: 04/01/2012 "quit smoking 20+ yr ago"  . Alcohol Use: 10.8 oz/week    2 Glasses of wine, 16 Shots of liquor per week     Comment: hx of ETOH abuse; 04/01/2012 "drink ~ 1 pint//wk; vodka; cup of wine/wk"   . Drug Use: No  . Sexual Activity: No   Other Topics Concern  . Not on file   Social History Narrative    Past Surgical History  Procedure Laterality Date  . Coronary artery bypass graft  ~ 2003    CABG X4  . Cataract extraction w/ intraocular lens  implant, bilateral      "years ago" (04/01/2012)  . Esophagogastroduodenoscopy  04/13/2012    Procedure: ESOPHAGOGASTRODUODENOSCOPY (EGD);  Surgeon: Florencia Reasons, MD;  Location: [facility] ENDOSCOPY;  Service: Endoscopy;  Laterality: N/A;  push peg  . Peg placement  04/13/2012    Procedure: PERCUTANEOUS ENDOSCOPIC GASTROSTOMY (  PEG) PLACEMENT;  Surgeon: Florencia Reasonsobert V Buccini, MD;  [facility];  Service: Endoscopy;  Laterality: N/A;    Family History  Problem Relation Age of Onset  . Heart failure Mother   . Aneurysm Sister   . Suicidality Brother   . Heart failure Brother   . Diabetes Sister     No Known Allergies  Current Outpatient Prescriptions on File Prior to Visit  Medication Sig Dispense Refill  . ACCU-CHEK SOFTCLIX LANCETS lancets 1 each by Other route daily as needed for other. 100 each 12  . Alcohol Swabs (ALCOHOL PADS) 70 % PADS     . aspirin 81 MG tablet Take 81 mg by mouth daily.    Marland Kitchen. atorvastatin (LIPITOR) 20 MG tablet Take 1 tablet (20 mg total) by mouth daily. 90 tablet 3  . glucose blood  (ACCU-CHEK AVIVA PLUS) test strip 1 each by Other route daily as needed for other. 100 each 12  . metoprolol (LOPRESSOR) 50 MG tablet TAKE 1.5 TABLETS (75 MG TOTAL) BY MOUTH 2 (TWO) TIMES DAILY. 180 tablet 1  . Multiple Vitamin (MULTIVITAMIN) tablet Take 1 tablet by mouth daily.    . tamsulosin (FLOMAX) 0.4 MG CAPS capsule TAKE 1 CAPSULE (0.4 MG TOTAL) BY MOUTH DAILY. 90 capsule 3   No current facility-administered medications on file prior to visit.    BP 150/90 mmHg  Pulse 96  Temp(Src) 98.3 F (36.8 C) (Oral)  Resp 20  Ht 5' 10.5" (1.791 m)  Wt 178 lb (80.74 kg)  BMI 25.17 kg/m2    Review of Systems  Constitutional: Positive for activity change and fatigue. Negative for fever, chills and appetite change.  HENT: Negative for congestion, dental problem, ear pain, hearing loss, sore throat, tinnitus, trouble swallowing and voice change.   Eyes: Negative for pain, discharge and visual disturbance.  Respiratory: Negative for cough, chest tightness, wheezing and stridor.   Cardiovascular: Negative for chest pain, palpitations and leg swelling.  Gastrointestinal: Negative for nausea, vomiting, abdominal pain, diarrhea, constipation, blood in stool and abdominal distention.  Genitourinary: Negative for urgency, hematuria, flank pain, discharge, difficulty urinating and genital sores.  Musculoskeletal: Positive for back pain, arthralgias and gait problem. Negative for myalgias, joint swelling and neck stiffness.  Skin: Negative for rash.  Neurological: Positive for weakness. Negative for dizziness, syncope, speech difficulty, numbness and headaches.  Hematological: Negative for adenopathy. Does not bruise/bleed easily.  Psychiatric/Behavioral: Negative for behavioral problems and dysphoric mood. The patient is not nervous/anxious.        Objective:   Physical Exam  Constitutional: He is oriented to person, place, and time. He appears well-developed.  Blood pressure 150/90.  Difficult  to auscultate  HENT:  Head: Normocephalic.  Right Ear: External ear normal.  Left Ear: External ear normal.  Eyes: Conjunctivae and EOM are normal.  Neck: Normal range of motion.  Cardiovascular: Normal rate and normal heart sounds.   Pulmonary/Chest: Breath sounds normal.  Abdominal: Bowel sounds are normal.  Musculoskeletal: Normal range of motion. He exhibits no edema or tenderness.  Decreased range of motion both shoulders  Neurological: He is alert and oriented to person, place, and time.  Generalized weakness  Psychiatric: He has a normal mood and affect. His behavior is normal.          Assessment & Plan:   Generalized weakness/debility.  Believe the patient would benefit from physical therapy for general strengthening and range of motion exercises Diet-controlled diabetes, stable.  Patient scheduled for a physical in  August.  We'll recheck hemoglobin A1c and lab at that time History of hypertension.  Blood pressure has been running borderline high.  Will consider antihypertensive medications at next visit Abnormal gait/high fall risk.  Hopefully physical therapy to improve

## 2014-08-31 NOTE — Progress Notes (Signed)
Pre visit review using our clinic review tool, if applicable. No additional management support is needed unless otherwise documented below in the visit note. 

## 2014-11-01 ENCOUNTER — Telehealth: Payer: Self-pay | Admitting: Internal Medicine

## 2014-11-01 DIAGNOSIS — R531 Weakness: Secondary | ICD-10-CM

## 2014-11-01 DIAGNOSIS — R29898 Other symptoms and signs involving the musculoskeletal system: Secondary | ICD-10-CM

## 2014-11-01 NOTE — Telephone Encounter (Signed)
Pt call to say that Dr Kirtland BouchardK told him that he was going to get him some physical therapy. Would like a call back

## 2014-11-02 NOTE — Telephone Encounter (Signed)
Please order PT

## 2014-11-02 NOTE — Telephone Encounter (Signed)
Please see message and advise 

## 2014-11-03 NOTE — Telephone Encounter (Signed)
Orders placed.

## 2014-11-16 ENCOUNTER — Other Ambulatory Visit: Payer: Self-pay | Admitting: Internal Medicine

## 2014-11-16 DIAGNOSIS — R531 Weakness: Secondary | ICD-10-CM

## 2014-11-16 DIAGNOSIS — R29898 Other symptoms and signs involving the musculoskeletal system: Secondary | ICD-10-CM

## 2014-11-20 ENCOUNTER — Telehealth: Payer: Self-pay | Admitting: Internal Medicine

## 2014-11-20 DIAGNOSIS — R2689 Other abnormalities of gait and mobility: Secondary | ICD-10-CM | POA: Diagnosis not present

## 2014-11-20 DIAGNOSIS — I10 Essential (primary) hypertension: Secondary | ICD-10-CM | POA: Diagnosis not present

## 2014-11-20 DIAGNOSIS — I2510 Atherosclerotic heart disease of native coronary artery without angina pectoris: Secondary | ICD-10-CM | POA: Diagnosis not present

## 2014-11-20 DIAGNOSIS — J449 Chronic obstructive pulmonary disease, unspecified: Secondary | ICD-10-CM | POA: Diagnosis not present

## 2014-11-20 DIAGNOSIS — I739 Peripheral vascular disease, unspecified: Secondary | ICD-10-CM | POA: Diagnosis not present

## 2014-11-20 DIAGNOSIS — I4891 Unspecified atrial fibrillation: Secondary | ICD-10-CM | POA: Diagnosis not present

## 2014-11-20 DIAGNOSIS — E46 Unspecified protein-calorie malnutrition: Secondary | ICD-10-CM | POA: Diagnosis not present

## 2014-11-20 DIAGNOSIS — E119 Type 2 diabetes mellitus without complications: Secondary | ICD-10-CM | POA: Diagnosis not present

## 2014-11-20 DIAGNOSIS — Z8674 Personal history of sudden cardiac arrest: Secondary | ICD-10-CM | POA: Diagnosis not present

## 2014-11-20 NOTE — Telephone Encounter (Signed)
Akiko call from [facility] call to ask for verbal orders for home Occupational therapy  [phone]

## 2014-11-22 DIAGNOSIS — E119 Type 2 diabetes mellitus without complications: Secondary | ICD-10-CM | POA: Diagnosis not present

## 2014-11-22 DIAGNOSIS — E46 Unspecified protein-calorie malnutrition: Secondary | ICD-10-CM | POA: Diagnosis not present

## 2014-11-22 DIAGNOSIS — I10 Essential (primary) hypertension: Secondary | ICD-10-CM | POA: Diagnosis not present

## 2014-11-22 DIAGNOSIS — R2689 Other abnormalities of gait and mobility: Secondary | ICD-10-CM | POA: Diagnosis not present

## 2014-11-22 DIAGNOSIS — Z8674 Personal history of sudden cardiac arrest: Secondary | ICD-10-CM | POA: Diagnosis not present

## 2014-11-22 DIAGNOSIS — J449 Chronic obstructive pulmonary disease, unspecified: Secondary | ICD-10-CM | POA: Diagnosis not present

## 2014-11-22 DIAGNOSIS — I739 Peripheral vascular disease, unspecified: Secondary | ICD-10-CM | POA: Diagnosis not present

## 2014-11-22 DIAGNOSIS — I2510 Atherosclerotic heart disease of native coronary artery without angina pectoris: Secondary | ICD-10-CM | POA: Diagnosis not present

## 2014-11-22 DIAGNOSIS — I4891 Unspecified atrial fibrillation: Secondary | ICD-10-CM | POA: Diagnosis not present

## 2014-11-22 NOTE — Telephone Encounter (Signed)
They cb today and want to add Social worker to that referral. Verbal is OK  FYI:  Delorse Limber is having a hard time taking his Blood pressure: hard to hear. Not able to take wants you to be aware. Will wait to hear from you

## 2014-11-22 NOTE — Telephone Encounter (Signed)
[facility] wanting orders for Social worker and Occupational Therapy. Okay to order?

## 2014-11-22 NOTE — Telephone Encounter (Signed)
Called Akiko, verbal orders given for Child psychotherapist and Occupational Therapy okay for pt per Dr. Molli Barrows verbalized understanding.

## 2014-11-22 NOTE — Telephone Encounter (Signed)
To donna 

## 2014-11-22 NOTE — Telephone Encounter (Signed)
ok 

## 2014-11-24 ENCOUNTER — Telehealth: Payer: Self-pay | Admitting: Internal Medicine

## 2014-11-24 ENCOUNTER — Other Ambulatory Visit: Payer: Self-pay | Admitting: Internal Medicine

## 2014-11-24 DIAGNOSIS — I10 Essential (primary) hypertension: Secondary | ICD-10-CM | POA: Diagnosis not present

## 2014-11-24 DIAGNOSIS — I2510 Atherosclerotic heart disease of native coronary artery without angina pectoris: Secondary | ICD-10-CM | POA: Diagnosis not present

## 2014-11-24 DIAGNOSIS — I4891 Unspecified atrial fibrillation: Secondary | ICD-10-CM | POA: Diagnosis not present

## 2014-11-24 DIAGNOSIS — Z8674 Personal history of sudden cardiac arrest: Secondary | ICD-10-CM | POA: Diagnosis not present

## 2014-11-24 DIAGNOSIS — E119 Type 2 diabetes mellitus without complications: Secondary | ICD-10-CM | POA: Diagnosis not present

## 2014-11-24 DIAGNOSIS — E46 Unspecified protein-calorie malnutrition: Secondary | ICD-10-CM | POA: Diagnosis not present

## 2014-11-24 DIAGNOSIS — J449 Chronic obstructive pulmonary disease, unspecified: Secondary | ICD-10-CM | POA: Diagnosis not present

## 2014-11-24 DIAGNOSIS — R2689 Other abnormalities of gait and mobility: Secondary | ICD-10-CM | POA: Diagnosis not present

## 2014-11-24 DIAGNOSIS — I739 Peripheral vascular disease, unspecified: Secondary | ICD-10-CM | POA: Diagnosis not present

## 2014-11-24 NOTE — Telephone Encounter (Signed)
Left message on machine for Akiko to returning her call

## 2014-11-24 NOTE — Telephone Encounter (Signed)
Akiko call from [facility] and would like a call back to discuss pt medicine . She said she would like to double check dosages   [phone]

## 2014-11-27 DIAGNOSIS — E46 Unspecified protein-calorie malnutrition: Secondary | ICD-10-CM | POA: Diagnosis not present

## 2014-11-27 DIAGNOSIS — E119 Type 2 diabetes mellitus without complications: Secondary | ICD-10-CM | POA: Diagnosis not present

## 2014-11-27 DIAGNOSIS — I10 Essential (primary) hypertension: Secondary | ICD-10-CM | POA: Diagnosis not present

## 2014-11-27 DIAGNOSIS — R2689 Other abnormalities of gait and mobility: Secondary | ICD-10-CM | POA: Diagnosis not present

## 2014-11-27 DIAGNOSIS — I2510 Atherosclerotic heart disease of native coronary artery without angina pectoris: Secondary | ICD-10-CM | POA: Diagnosis not present

## 2014-11-27 DIAGNOSIS — I739 Peripheral vascular disease, unspecified: Secondary | ICD-10-CM | POA: Diagnosis not present

## 2014-11-27 DIAGNOSIS — Z8674 Personal history of sudden cardiac arrest: Secondary | ICD-10-CM | POA: Diagnosis not present

## 2014-11-27 DIAGNOSIS — J449 Chronic obstructive pulmonary disease, unspecified: Secondary | ICD-10-CM | POA: Diagnosis not present

## 2014-11-27 DIAGNOSIS — I4891 Unspecified atrial fibrillation: Secondary | ICD-10-CM | POA: Diagnosis not present

## 2014-11-27 NOTE — Telephone Encounter (Signed)
Akiko called back went over pt's medications and dosage. Akiko verbalized understanding.

## 2014-11-28 DIAGNOSIS — R2689 Other abnormalities of gait and mobility: Secondary | ICD-10-CM | POA: Diagnosis not present

## 2014-11-29 DIAGNOSIS — I2510 Atherosclerotic heart disease of native coronary artery without angina pectoris: Secondary | ICD-10-CM | POA: Diagnosis not present

## 2014-11-29 DIAGNOSIS — I739 Peripheral vascular disease, unspecified: Secondary | ICD-10-CM | POA: Diagnosis not present

## 2014-11-29 DIAGNOSIS — I4891 Unspecified atrial fibrillation: Secondary | ICD-10-CM | POA: Diagnosis not present

## 2014-11-29 DIAGNOSIS — E119 Type 2 diabetes mellitus without complications: Secondary | ICD-10-CM | POA: Diagnosis not present

## 2014-11-29 DIAGNOSIS — I10 Essential (primary) hypertension: Secondary | ICD-10-CM | POA: Diagnosis not present

## 2014-11-29 DIAGNOSIS — R2689 Other abnormalities of gait and mobility: Secondary | ICD-10-CM | POA: Diagnosis not present

## 2014-11-29 DIAGNOSIS — Z8674 Personal history of sudden cardiac arrest: Secondary | ICD-10-CM | POA: Diagnosis not present

## 2014-11-29 DIAGNOSIS — E46 Unspecified protein-calorie malnutrition: Secondary | ICD-10-CM | POA: Diagnosis not present

## 2014-11-29 DIAGNOSIS — J449 Chronic obstructive pulmonary disease, unspecified: Secondary | ICD-10-CM | POA: Diagnosis not present

## 2014-11-30 DIAGNOSIS — I2510 Atherosclerotic heart disease of native coronary artery without angina pectoris: Secondary | ICD-10-CM | POA: Diagnosis not present

## 2014-11-30 DIAGNOSIS — I739 Peripheral vascular disease, unspecified: Secondary | ICD-10-CM | POA: Diagnosis not present

## 2014-11-30 DIAGNOSIS — Z8674 Personal history of sudden cardiac arrest: Secondary | ICD-10-CM | POA: Diagnosis not present

## 2014-11-30 DIAGNOSIS — R2689 Other abnormalities of gait and mobility: Secondary | ICD-10-CM | POA: Diagnosis not present

## 2014-11-30 DIAGNOSIS — J449 Chronic obstructive pulmonary disease, unspecified: Secondary | ICD-10-CM | POA: Diagnosis not present

## 2014-11-30 DIAGNOSIS — E119 Type 2 diabetes mellitus without complications: Secondary | ICD-10-CM | POA: Diagnosis not present

## 2014-11-30 DIAGNOSIS — I4891 Unspecified atrial fibrillation: Secondary | ICD-10-CM | POA: Diagnosis not present

## 2014-11-30 DIAGNOSIS — I10 Essential (primary) hypertension: Secondary | ICD-10-CM | POA: Diagnosis not present

## 2014-11-30 DIAGNOSIS — E46 Unspecified protein-calorie malnutrition: Secondary | ICD-10-CM | POA: Diagnosis not present

## 2014-12-04 DIAGNOSIS — R2689 Other abnormalities of gait and mobility: Secondary | ICD-10-CM | POA: Diagnosis not present

## 2014-12-04 DIAGNOSIS — I2510 Atherosclerotic heart disease of native coronary artery without angina pectoris: Secondary | ICD-10-CM | POA: Diagnosis not present

## 2014-12-04 DIAGNOSIS — I4891 Unspecified atrial fibrillation: Secondary | ICD-10-CM | POA: Diagnosis not present

## 2014-12-04 DIAGNOSIS — I10 Essential (primary) hypertension: Secondary | ICD-10-CM | POA: Diagnosis not present

## 2014-12-04 DIAGNOSIS — E46 Unspecified protein-calorie malnutrition: Secondary | ICD-10-CM | POA: Diagnosis not present

## 2014-12-04 DIAGNOSIS — I739 Peripheral vascular disease, unspecified: Secondary | ICD-10-CM | POA: Diagnosis not present

## 2014-12-04 DIAGNOSIS — Z8674 Personal history of sudden cardiac arrest: Secondary | ICD-10-CM | POA: Diagnosis not present

## 2014-12-04 DIAGNOSIS — J449 Chronic obstructive pulmonary disease, unspecified: Secondary | ICD-10-CM | POA: Diagnosis not present

## 2014-12-04 DIAGNOSIS — E119 Type 2 diabetes mellitus without complications: Secondary | ICD-10-CM | POA: Diagnosis not present

## 2014-12-05 DIAGNOSIS — Z8674 Personal history of sudden cardiac arrest: Secondary | ICD-10-CM | POA: Diagnosis not present

## 2014-12-05 DIAGNOSIS — R2689 Other abnormalities of gait and mobility: Secondary | ICD-10-CM | POA: Diagnosis not present

## 2014-12-05 DIAGNOSIS — I739 Peripheral vascular disease, unspecified: Secondary | ICD-10-CM | POA: Diagnosis not present

## 2014-12-05 DIAGNOSIS — I2510 Atherosclerotic heart disease of native coronary artery without angina pectoris: Secondary | ICD-10-CM | POA: Diagnosis not present

## 2014-12-05 DIAGNOSIS — I10 Essential (primary) hypertension: Secondary | ICD-10-CM | POA: Diagnosis not present

## 2014-12-05 DIAGNOSIS — J449 Chronic obstructive pulmonary disease, unspecified: Secondary | ICD-10-CM | POA: Diagnosis not present

## 2014-12-05 DIAGNOSIS — E46 Unspecified protein-calorie malnutrition: Secondary | ICD-10-CM | POA: Diagnosis not present

## 2014-12-05 DIAGNOSIS — E119 Type 2 diabetes mellitus without complications: Secondary | ICD-10-CM | POA: Diagnosis not present

## 2014-12-05 DIAGNOSIS — I4891 Unspecified atrial fibrillation: Secondary | ICD-10-CM | POA: Diagnosis not present

## 2014-12-06 ENCOUNTER — Telehealth: Payer: Self-pay | Admitting: Internal Medicine

## 2014-12-06 DIAGNOSIS — Z8674 Personal history of sudden cardiac arrest: Secondary | ICD-10-CM | POA: Diagnosis not present

## 2014-12-06 DIAGNOSIS — I4891 Unspecified atrial fibrillation: Secondary | ICD-10-CM | POA: Diagnosis not present

## 2014-12-06 DIAGNOSIS — I739 Peripheral vascular disease, unspecified: Secondary | ICD-10-CM | POA: Diagnosis not present

## 2014-12-06 DIAGNOSIS — I2510 Atherosclerotic heart disease of native coronary artery without angina pectoris: Secondary | ICD-10-CM | POA: Diagnosis not present

## 2014-12-06 DIAGNOSIS — R2689 Other abnormalities of gait and mobility: Secondary | ICD-10-CM | POA: Diagnosis not present

## 2014-12-06 DIAGNOSIS — E119 Type 2 diabetes mellitus without complications: Secondary | ICD-10-CM | POA: Diagnosis not present

## 2014-12-06 DIAGNOSIS — J449 Chronic obstructive pulmonary disease, unspecified: Secondary | ICD-10-CM | POA: Diagnosis not present

## 2014-12-06 DIAGNOSIS — I10 Essential (primary) hypertension: Secondary | ICD-10-CM | POA: Diagnosis not present

## 2014-12-06 DIAGNOSIS — E46 Unspecified protein-calorie malnutrition: Secondary | ICD-10-CM | POA: Diagnosis not present

## 2014-12-06 NOTE — Telephone Encounter (Signed)
Akio call from [facility] call to say pt bp is up. Right arm 180/85 187/61 left arm 155/53  She is asking if there is something else that need to be done   [facility] phone numbwer is [phone]

## 2014-12-06 NOTE — Telephone Encounter (Signed)
Spoke to Hawley, told her to have pt monitor blood pressure if it continues to be elevated needs to be seen. Francia Greaves said pt has an appt on Friday and that pt has just started taking Metoprolol correctly two days ago due to just got pill cutter. Told her okay, will evaluate on Friday when pt is seen. Akio verbalized understanding.

## 2014-12-07 DIAGNOSIS — R2689 Other abnormalities of gait and mobility: Secondary | ICD-10-CM | POA: Diagnosis not present

## 2014-12-07 DIAGNOSIS — E46 Unspecified protein-calorie malnutrition: Secondary | ICD-10-CM | POA: Diagnosis not present

## 2014-12-07 DIAGNOSIS — I4891 Unspecified atrial fibrillation: Secondary | ICD-10-CM | POA: Diagnosis not present

## 2014-12-07 DIAGNOSIS — I739 Peripheral vascular disease, unspecified: Secondary | ICD-10-CM | POA: Diagnosis not present

## 2014-12-07 DIAGNOSIS — J449 Chronic obstructive pulmonary disease, unspecified: Secondary | ICD-10-CM | POA: Diagnosis not present

## 2014-12-07 DIAGNOSIS — I10 Essential (primary) hypertension: Secondary | ICD-10-CM | POA: Diagnosis not present

## 2014-12-07 DIAGNOSIS — Z8674 Personal history of sudden cardiac arrest: Secondary | ICD-10-CM | POA: Diagnosis not present

## 2014-12-07 DIAGNOSIS — I2510 Atherosclerotic heart disease of native coronary artery without angina pectoris: Secondary | ICD-10-CM | POA: Diagnosis not present

## 2014-12-07 DIAGNOSIS — E119 Type 2 diabetes mellitus without complications: Secondary | ICD-10-CM | POA: Diagnosis not present

## 2014-12-08 ENCOUNTER — Telehealth: Payer: Self-pay | Admitting: Internal Medicine

## 2014-12-08 ENCOUNTER — Encounter: Payer: Self-pay | Admitting: Internal Medicine

## 2014-12-08 ENCOUNTER — Ambulatory Visit (INDEPENDENT_AMBULATORY_CARE_PROVIDER_SITE_OTHER): Payer: Commercial Managed Care - HMO | Admitting: Internal Medicine

## 2014-12-08 VITALS — BP 128/80 | HR 67 | Temp 98.1°F | Resp 20 | Ht 70.5 in | Wt 179.0 lb

## 2014-12-08 DIAGNOSIS — E1151 Type 2 diabetes mellitus with diabetic peripheral angiopathy without gangrene: Secondary | ICD-10-CM

## 2014-12-08 DIAGNOSIS — I4891 Unspecified atrial fibrillation: Secondary | ICD-10-CM | POA: Diagnosis not present

## 2014-12-08 DIAGNOSIS — I10 Essential (primary) hypertension: Secondary | ICD-10-CM | POA: Diagnosis not present

## 2014-12-08 DIAGNOSIS — I480 Paroxysmal atrial fibrillation: Secondary | ICD-10-CM | POA: Diagnosis not present

## 2014-12-08 LAB — COMPREHENSIVE METABOLIC PANEL
ALT: 10 U/L (ref 9–46)
AST: 15 U/L (ref 10–35)
Albumin: 3.8 g/dL (ref 3.6–5.1)
Alkaline Phosphatase: 98 U/L (ref 40–115)
BUN: 26 mg/dL — AB (ref 7–25)
CO2: 26 mmol/L (ref 20–31)
CREATININE: 1.16 mg/dL (ref 0.70–1.18)
Calcium: 9.2 mg/dL (ref 8.6–10.3)
Chloride: 107 mmol/L (ref 98–110)
GLUCOSE: 105 mg/dL — AB (ref 65–99)
POTASSIUM: 5.7 mmol/L — AB (ref 3.5–5.3)
SODIUM: 141 mmol/L (ref 135–146)
TOTAL PROTEIN: 7.2 g/dL (ref 6.1–8.1)
Total Bilirubin: 0.4 mg/dL (ref 0.2–1.2)

## 2014-12-08 LAB — CBC WITH DIFFERENTIAL/PLATELET
BASOS PCT: 0 % (ref 0–1)
Basophils Absolute: 0 10*3/uL (ref 0.0–0.1)
EOS ABS: 0.2 10*3/uL (ref 0.0–0.7)
Eosinophils Relative: 2 % (ref 0–5)
HCT: 33.9 % — ABNORMAL LOW (ref 39.0–52.0)
HEMOGLOBIN: 11.1 g/dL — AB (ref 13.0–17.0)
Lymphocytes Relative: 47 % — ABNORMAL HIGH (ref 12–46)
Lymphs Abs: 3.6 10*3/uL (ref 0.7–4.0)
MCH: 31.6 pg (ref 26.0–34.0)
MCHC: 32.7 g/dL (ref 30.0–36.0)
MCV: 96.6 fL (ref 78.0–100.0)
MONO ABS: 0.7 10*3/uL (ref 0.1–1.0)
MPV: 11.7 fL (ref 8.6–12.4)
Monocytes Relative: 9 % (ref 3–12)
NEUTROS ABS: 3.2 10*3/uL (ref 1.7–7.7)
NEUTROS PCT: 42 % — AB (ref 43–77)
PLATELETS: 184 10*3/uL (ref 150–400)
RBC: 3.51 MIL/uL — AB (ref 4.22–5.81)
RDW: 13.9 % (ref 11.5–15.5)
WBC: 7.6 10*3/uL (ref 4.0–10.5)

## 2014-12-08 LAB — LIPID PANEL
CHOLESTEROL: 108 mg/dL — AB (ref 125–200)
HDL: 39 mg/dL — ABNORMAL LOW (ref >=40)
LDL Cholesterol: 50 mg/dL (ref <130)
TRIGLYCERIDES: 96 mg/dL (ref <150)
Total CHOL/HDL Ratio: 2.8 Ratio (ref <=5.0)
VLDL: 19 mg/dL (ref <30)

## 2014-12-08 NOTE — Telephone Encounter (Signed)
Use a sda for 03/09/15  His ride can not get him here till 4 pm  Hope this was ok

## 2014-12-08 NOTE — Progress Notes (Signed)
Subjective:    Patient ID: Johnny [name], male    DOB: 30-Jan-1935, 80 y.o.   MRN: [id number]  HPI  Lab Results  Component Value Date   HGBA1C 6.2 06/09/2014    Wt Readings from Last 3 Encounters:  12/08/14 179 lb (81.194 kg)  08/31/14 178 lb (80.74 kg)  06/09/14 181 lb (82.101 kg)    BP Readings from Last 3 Encounters:  12/08/14 128/80  08/31/14 150/90  06/09/14 55/47   80 year old patient who is seen today for follow-up of his multiple medical issues.  He has a history of diabetes, kidney by peripheral vascular disease.  He is now diet controlled. He has a history of paroxysmal atrial fibrillation but is a high fall risk and is no longer on anticoagulation.  He is ambulatory with a walker. Remains on statin therapy. He has coronary artery disease but denies any cardiopulmonary complaints.  No recent eye examination  He has a history of protein calorie malnutrition, but his weight has been stable this year  Past Medical History  Diagnosis Date  . Acute alcoholic hepatitis 05/20/2010  . Anemia of other chronic disease 08/16/2007  . Atrial fibrillation 08/12/2007  . CORONARY ARTERY DISEASE 12/11/2006  . HYPERTENSION 12/11/2006  . Hyperlipidemia   . ETOH abuse   . Chronic low back pain   . Pneumonia     "now; never before" (04/01/2012)  . Hallucination 04/01/2012  . Exertional dyspnea   . DIABETES MELLITUS, TYPE II 12/11/2006  . Arthritis     "think that's what's in my back" (04/01/2012)  . Altered mental status 04/01/2012  . Seizures 04/01/2012    seizure-like activity @ home today/notes 04/01/2012     Social History   Social History  . Marital Status: Married    Spouse Name: N/A  . Number of Children: N/A  . Years of Education: N/A   Occupational History  . Not on file.   Social History Main Topics  . Smoking status: Former Smoker -- 0.50 packs/day for 17 years    Types: Cigarettes    Quit date: 04/22/1983  . Smokeless tobacco: Never Used     Comment:  04/01/2012 "quit smoking 20+ yr ago"  . Alcohol Use: 10.8 oz/week    2 Glasses of wine, 16 Shots of liquor per week     Comment: hx of ETOH abuse; 04/01/2012 "drink ~ 1 pint//wk; vodka; cup of wine/wk"   . Drug Use: No  . Sexual Activity: No   Other Topics Concern  . Not on file   Social History Narrative    Past Surgical History  Procedure Laterality Date  . Coronary artery bypass graft  ~ 2003    CABG X4  . Cataract extraction w/ intraocular lens  implant, bilateral      "years ago" (04/01/2012)  . Esophagogastroduodenoscopy  04/13/2012    Procedure: ESOPHAGOGASTRODUODENOSCOPY (EGD);  Surgeon: Florencia Reasons, MD;  Location: [facility] ENDOSCOPY;  Service: Endoscopy;  Laterality: N/A;  push peg  . Peg placement  04/13/2012    Procedure: PERCUTANEOUS ENDOSCOPIC GASTROSTOMY (PEG) PLACEMENT;  Surgeon: Florencia Reasons, MD;  Location: [facility] ENDOSCOPY;  Service: Endoscopy;  Laterality: N/A;    Family History  Problem Relation Age of Onset  . Heart failure Mother   . Aneurysm Sister   . Suicidality Brother   . Heart failure Brother   . Diabetes Sister     No Known Allergies  Current Outpatient Prescriptions on File Prior to Visit  Medication Sig  Dispense Refill  . ACCU-CHEK SOFTCLIX LANCETS lancets 1 each by Other route daily as needed for other. 100 each 12  . Alcohol Swabs (ALCOHOL PADS) 70 % PADS     . aspirin 81 MG tablet Take 81 mg by mouth daily.    Marland Kitchen atorvastatin (LIPITOR) 20 MG tablet TAKE 1 TABLET (20 MG TOTAL) BY MOUTH DAILY. 90 tablet 3  . glucose blood (ACCU-CHEK AVIVA PLUS) test strip 1 each by Other route daily as needed for other. 100 each 12  . metoprolol (LOPRESSOR) 50 MG tablet TAKE 1.5 TABLETS (75 MG TOTAL) BY MOUTH 2 (TWO) TIMES DAILY. 180 tablet 1  . Multiple Vitamin (MULTIVITAMIN) tablet Take 1 tablet by mouth daily.    . tamsulosin (FLOMAX) 0.4 MG CAPS capsule TAKE 1 CAPSULE (0.4 MG TOTAL) BY MOUTH DAILY. 90 capsule 3   No current facility-administered  medications on file prior to visit.    BP 128/80 mmHg  Pulse 67  Temp(Src) 98.1 F (36.7 C) (Oral)  Resp 20  Ht 5' 10.5" (1.791 m)  Wt 179 lb (81.194 kg)  BMI 25.31 kg/m2  SpO2 98%     Review of Systems  Constitutional: Negative for fever, chills, appetite change and fatigue.  HENT: Negative for congestion, dental problem, ear pain, hearing loss, sore throat, tinnitus, trouble swallowing and voice change.   Eyes: Negative for pain, discharge and visual disturbance.  Respiratory: Negative for cough, chest tightness, wheezing and stridor.   Cardiovascular: Negative for chest pain, palpitations and leg swelling.  Gastrointestinal: Negative for nausea, vomiting, abdominal pain, diarrhea, constipation, blood in stool and abdominal distention.  Genitourinary: Negative for urgency, hematuria, flank pain, discharge, difficulty urinating and genital sores.  Musculoskeletal: Negative for myalgias, back pain, joint swelling, arthralgias, gait problem and neck stiffness.  Skin: Negative for rash.  Neurological: Negative for dizziness, syncope, speech difficulty, weakness, numbness and headaches.  Hematological: Negative for adenopathy. Does not bruise/bleed easily.  Psychiatric/Behavioral: Negative for behavioral problems and dysphoric mood. The patient is not nervous/anxious.        Objective:   Physical Exam  Constitutional: He is oriented to person, place, and time. He appears well-developed.  Elderly Chronically ill-appearing Blood pressure 128/78  HENT:  Head: Normocephalic.  Right Ear: External ear normal.  Left Ear: External ear normal.  Eyes: Conjunctivae and EOM are normal.  Neck: Normal range of motion.  Cardiovascular: Normal rate, regular rhythm and normal heart sounds.   Pulses regular  Pulmonary/Chest: Breath sounds normal.  Abdominal: Bowel sounds are normal.  Musculoskeletal: Normal range of motion. He exhibits no edema or tenderness.  Neurological: He is alert  and oriented to person, place, and time.  Psychiatric: He has a normal mood and affect. His behavior is normal.          Assessment & Plan:   Diabetes mellitus.  Now diet controlled.  We'll check a hemoglobin A1c Hypertension/coronary artery disease.  Blood pressure has been fairly labile.  No change in therapy at this time.  We'll continue beta blocker therapy Dyslipidemia.  Continue statin therapy.  We'll check a lipid profile History back on abuse History of anemia.  Will check CBC  Gait abnormality.  Multifactorial with generalized weakness and peripheral neuropathy all  playing a role Recheck 3-6 months Yearly eye examination encouraged

## 2014-12-08 NOTE — Telephone Encounter (Signed)
Johnny [name], that was fine.

## 2014-12-08 NOTE — Patient Instructions (Signed)
Please see your eye doctor yearly to check for diabetic eye damage  Limit your sodium (Salt) intake   Please check your hemoglobin A1c every 3-6  months.  Please check your blood pressure on a regular basis.  If it is consistently greater than 150/90, please make an office appointment.

## 2014-12-08 NOTE — Progress Notes (Signed)
Pre visit review using our clinic review tool, if applicable. No additional management support is needed unless otherwise documented below in the visit note. 

## 2014-12-09 LAB — TSH: TSH: 2.099 u[IU]/mL (ref 0.350–4.500)

## 2014-12-09 LAB — MICROALBUMIN / CREATININE URINE RATIO
CREATININE, URINE: 118.4 mg/dL
MICROALB UR: 2.4 mg/dL — AB (ref <2.0)
MICROALB/CREAT RATIO: 20.3 mg/g (ref 0.0–30.0)

## 2014-12-09 LAB — HEMOGLOBIN A1C
Hgb A1c MFr Bld: 6.1 % — ABNORMAL HIGH (ref <5.7)
MEAN PLASMA GLUCOSE: 128 mg/dL — AB (ref <117)

## 2014-12-11 DIAGNOSIS — I2510 Atherosclerotic heart disease of native coronary artery without angina pectoris: Secondary | ICD-10-CM | POA: Diagnosis not present

## 2014-12-11 DIAGNOSIS — Z8674 Personal history of sudden cardiac arrest: Secondary | ICD-10-CM | POA: Diagnosis not present

## 2014-12-11 DIAGNOSIS — E46 Unspecified protein-calorie malnutrition: Secondary | ICD-10-CM | POA: Diagnosis not present

## 2014-12-11 DIAGNOSIS — R2689 Other abnormalities of gait and mobility: Secondary | ICD-10-CM | POA: Diagnosis not present

## 2014-12-11 DIAGNOSIS — J449 Chronic obstructive pulmonary disease, unspecified: Secondary | ICD-10-CM | POA: Diagnosis not present

## 2014-12-11 DIAGNOSIS — I10 Essential (primary) hypertension: Secondary | ICD-10-CM | POA: Diagnosis not present

## 2014-12-11 DIAGNOSIS — I4891 Unspecified atrial fibrillation: Secondary | ICD-10-CM | POA: Diagnosis not present

## 2014-12-11 DIAGNOSIS — I739 Peripheral vascular disease, unspecified: Secondary | ICD-10-CM | POA: Diagnosis not present

## 2014-12-11 DIAGNOSIS — E119 Type 2 diabetes mellitus without complications: Secondary | ICD-10-CM | POA: Diagnosis not present

## 2014-12-13 DIAGNOSIS — I10 Essential (primary) hypertension: Secondary | ICD-10-CM | POA: Diagnosis not present

## 2014-12-13 DIAGNOSIS — I739 Peripheral vascular disease, unspecified: Secondary | ICD-10-CM | POA: Diagnosis not present

## 2014-12-13 DIAGNOSIS — J449 Chronic obstructive pulmonary disease, unspecified: Secondary | ICD-10-CM | POA: Diagnosis not present

## 2014-12-13 DIAGNOSIS — E46 Unspecified protein-calorie malnutrition: Secondary | ICD-10-CM | POA: Diagnosis not present

## 2014-12-13 DIAGNOSIS — E119 Type 2 diabetes mellitus without complications: Secondary | ICD-10-CM | POA: Diagnosis not present

## 2014-12-13 DIAGNOSIS — R2689 Other abnormalities of gait and mobility: Secondary | ICD-10-CM | POA: Diagnosis not present

## 2014-12-13 DIAGNOSIS — Z8674 Personal history of sudden cardiac arrest: Secondary | ICD-10-CM | POA: Diagnosis not present

## 2014-12-13 DIAGNOSIS — I4891 Unspecified atrial fibrillation: Secondary | ICD-10-CM | POA: Diagnosis not present

## 2014-12-13 DIAGNOSIS — I2510 Atherosclerotic heart disease of native coronary artery without angina pectoris: Secondary | ICD-10-CM | POA: Diagnosis not present

## 2014-12-26 ENCOUNTER — Other Ambulatory Visit: Payer: Self-pay | Admitting: Internal Medicine

## 2015-03-09 ENCOUNTER — Ambulatory Visit: Payer: Commercial Managed Care - HMO | Admitting: Internal Medicine

## 2015-03-23 ENCOUNTER — Encounter: Payer: Self-pay | Admitting: Internal Medicine

## 2015-03-23 ENCOUNTER — Ambulatory Visit (INDEPENDENT_AMBULATORY_CARE_PROVIDER_SITE_OTHER): Payer: Commercial Managed Care - HMO | Admitting: Internal Medicine

## 2015-03-23 VITALS — BP 140/88 | HR 72 | Temp 98.0°F | Resp 20 | Ht 70.5 in | Wt 179.0 lb

## 2015-03-23 DIAGNOSIS — I2510 Atherosclerotic heart disease of native coronary artery without angina pectoris: Secondary | ICD-10-CM | POA: Diagnosis not present

## 2015-03-23 DIAGNOSIS — E1151 Type 2 diabetes mellitus with diabetic peripheral angiopathy without gangrene: Secondary | ICD-10-CM

## 2015-03-23 DIAGNOSIS — Z23 Encounter for immunization: Secondary | ICD-10-CM

## 2015-03-23 DIAGNOSIS — I10 Essential (primary) hypertension: Secondary | ICD-10-CM | POA: Diagnosis not present

## 2015-03-23 NOTE — Progress Notes (Signed)
Subjective:    Patient ID: Johnny [name], male    DOB: Jul 12, 1934, 80 y.o.   MRN: [id number]  HPI  80 year old patient who is seen today for his quarterly follow-up.  He has essential hypertension.  He has a history of type 2 diabetes that is now diet controlled.  He has a history of alcoholism but has been almost totally abstinent.  He has done well today.  He has history of coronary artery disease and paroxysmal atrial fibrillation.  No palpitations.  No new concerns or complaints.  He is accompanied by his daughter today.  Weight has been stable.  Past Medical History  Diagnosis Date  . Acute alcoholic hepatitis 05/20/2010  . Anemia of other chronic disease 08/16/2007  . Atrial fibrillation (HCC) 08/12/2007  . CORONARY ARTERY DISEASE 12/11/2006  . HYPERTENSION 12/11/2006  . Hyperlipidemia   . ETOH abuse   . Chronic low back pain   . Pneumonia     "now; never before" (04/01/2012)  . Hallucination 04/01/2012  . Exertional dyspnea   . DIABETES MELLITUS, TYPE II 12/11/2006  . Arthritis     "think that's what's in my back" (04/01/2012)  . Altered mental status 04/01/2012  . Seizures (HCC) 04/01/2012    seizure-like activity @ home today/notes 04/01/2012     Social History   Social History  . Marital Status: Married    Spouse Name: N/A  . Number of Children: N/A  . Years of Education: N/A   Occupational History  . Not on file.   Social History Main Topics  . Smoking status: Former Smoker -- 0.50 packs/day for 17 years    Types: Cigarettes    Quit date: 04/22/1983  . Smokeless tobacco: Never Used     Comment: 04/01/2012 "quit smoking 20+ yr ago"  . Alcohol Use: 10.8 oz/week    2 Glasses of wine, 16 Shots of liquor per week     Comment: hx of ETOH abuse; 04/01/2012 "drink ~ 1 pint//wk; vodka; cup of wine/wk"   . Drug Use: No  . Sexual Activity: No   Other Topics Concern  . Not on file   Social History Narrative    Past Surgical History  Procedure Laterality Date  .  Coronary artery bypass graft  ~ 2003    CABG X4  . Cataract extraction w/ intraocular lens  implant, bilateral      "years ago" (04/01/2012)  . Esophagogastroduodenoscopy  04/13/2012    Procedure: ESOPHAGOGASTRODUODENOSCOPY (EGD);  Surgeon: Florencia Reasons, MD;  Location: [facility] ENDOSCOPY;  Service: Endoscopy;  Laterality: N/A;  push peg  . Peg placement  04/13/2012    Procedure: PERCUTANEOUS ENDOSCOPIC GASTROSTOMY (PEG) PLACEMENT;  Surgeon: Florencia Reasons, MD;  Location: [facility] ENDOSCOPY;  Service: Endoscopy;  Laterality: N/A;    Family History  Problem Relation Age of Onset  . Heart failure Mother   . Aneurysm Sister   . Suicidality Brother   . Heart failure Brother   . Diabetes Sister     No Known Allergies  Current Outpatient Prescriptions on File Prior to Visit  Medication Sig Dispense Refill  . ACCU-CHEK SOFTCLIX LANCETS lancets 1 each by Other route daily as needed for other. 100 each 12  . Alcohol Swabs (ALCOHOL PADS) 70 % PADS     . aspirin 81 MG tablet Take 81 mg by mouth daily.    Marland Kitchen atorvastatin (LIPITOR) 20 MG tablet TAKE 1 TABLET (20 MG TOTAL) BY MOUTH DAILY. 90 tablet 3  .  glucose blood (ACCU-CHEK AVIVA PLUS) test strip 1 each by Other route daily as needed for other. 100 each 12  . metoprolol (LOPRESSOR) 50 MG tablet TAKE 1.5 TABLETS (75 MG TOTAL) BY MOUTH 2 (TWO) TIMES DAILY. 180 tablet 1  . Multiple Vitamin (MULTIVITAMIN) tablet Take 1 tablet by mouth daily.    . tamsulosin (FLOMAX) 0.4 MG CAPS capsule TAKE 1 CAPSULE (0.4 MG TOTAL) BY MOUTH DAILY. 90 capsule 3   No current facility-administered medications on file prior to visit.    BP 140/88 mmHg  Pulse 72  Temp(Src) 98 F (36.7 C) (Oral)  Resp 20  Ht 5' 10.5" (1.791 m)  Wt 179 lb (81.194 kg)  BMI 25.31 kg/m2  SpO2 99%     Review of Systems  Constitutional: Negative for fever, chills, appetite change and fatigue.  HENT: Negative for congestion, dental problem, ear pain, hearing loss, sore throat,  tinnitus, trouble swallowing and voice change.   Eyes: Negative for pain, discharge and visual disturbance.  Respiratory: Negative for cough, chest tightness, wheezing and stridor.   Cardiovascular: Negative for chest pain, palpitations and leg swelling.  Gastrointestinal: Negative for nausea, vomiting, abdominal pain, diarrhea, constipation, blood in stool and abdominal distention.  Genitourinary: Negative for urgency, hematuria, flank pain, discharge, difficulty urinating and genital sores.  Musculoskeletal: Negative for myalgias, back pain, joint swelling, arthralgias, gait problem and neck stiffness.  Skin: Negative for rash.  Neurological: Negative for dizziness, syncope, speech difficulty, weakness, numbness and headaches.  Hematological: Negative for adenopathy. Does not bruise/bleed easily.  Psychiatric/Behavioral: Negative for behavioral problems and dysphoric mood. The patient is not nervous/anxious.        Objective:   Physical Exam  Constitutional: He is oriented to person, place, and time. He appears well-developed.  HENT:  Head: Normocephalic.  Right Ear: External ear normal.  Left Ear: External ear normal.  Eyes: Conjunctivae and EOM are normal.  Neck: Normal range of motion.  Cardiovascular: Normal rate and normal heart sounds.   Pulmonary/Chest: Breath sounds normal.  Abdominal: Bowel sounds are normal.  Musculoskeletal: Normal range of motion. He exhibits no edema or tenderness.  Neurological: He is alert and oriented to person, place, and time.  Psychiatric: He has a normal mood and affect. His behavior is normal.          Assessment & Plan:   Diabetes mellitus.  Hemoglobin A1c remains in a nondiabetic range.  Random blood sugar today 139 Hypertension, stable History of atrial fibrillation.  Remains in normal sinus rhythm Coronary artery disease, stable  No change in therapy  Recheck 4 months with updated lab

## 2015-03-23 NOTE — Patient Instructions (Signed)
Limit your sodium (Salt) intake  Return in 4 months for follow-up  

## 2015-04-11 ENCOUNTER — Telehealth: Payer: Self-pay

## 2015-04-11 NOTE — Telephone Encounter (Signed)
Can the 07/2015 appt be changed from a 15 min to a 30 min AWV?   Let me know, Thanks!

## 2015-04-12 NOTE — Telephone Encounter (Signed)
Johnny [name] can you see if [name] can come in earlier and use a CPE slot for a MAW exam?

## 2015-04-12 NOTE — Telephone Encounter (Signed)
Pt has been sch for 07-2015 4 pm

## 2015-04-19 ENCOUNTER — Other Ambulatory Visit: Payer: Self-pay | Admitting: Internal Medicine

## 2015-04-30 ENCOUNTER — Encounter (HOSPITAL_COMMUNITY): Payer: Self-pay

## 2015-04-30 ENCOUNTER — Inpatient Hospital Stay (HOSPITAL_COMMUNITY)
Admission: EM | Admit: 2015-04-30 | Discharge: 2015-05-08 | DRG: 896 | Disposition: A | Discharge status: Skilled Nursing Facility | Payer: Commercial Managed Care - HMO | Attending: Internal Medicine | Admitting: Internal Medicine

## 2015-04-30 ENCOUNTER — Emergency Department (HOSPITAL_COMMUNITY): Payer: Commercial Managed Care - HMO

## 2015-04-30 DIAGNOSIS — F10229 Alcohol dependence with intoxication, unspecified: Secondary | ICD-10-CM | POA: Diagnosis present

## 2015-04-30 DIAGNOSIS — R059 Cough, unspecified: Secondary | ICD-10-CM

## 2015-04-30 DIAGNOSIS — G9349 Other encephalopathy: Secondary | ICD-10-CM | POA: Diagnosis not present

## 2015-04-30 DIAGNOSIS — E46 Unspecified protein-calorie malnutrition: Secondary | ICD-10-CM | POA: Diagnosis not present

## 2015-04-30 DIAGNOSIS — E0869 Diabetes mellitus due to underlying condition with other specified complication: Secondary | ICD-10-CM | POA: Diagnosis not present

## 2015-04-30 DIAGNOSIS — I5022 Chronic systolic (congestive) heart failure: Secondary | ICD-10-CM | POA: Diagnosis not present

## 2015-04-30 DIAGNOSIS — M1990 Unspecified osteoarthritis, unspecified site: Secondary | ICD-10-CM | POA: Diagnosis present

## 2015-04-30 DIAGNOSIS — Z8674 Personal history of sudden cardiac arrest: Secondary | ICD-10-CM | POA: Diagnosis not present

## 2015-04-30 DIAGNOSIS — R05 Cough: Secondary | ICD-10-CM

## 2015-04-30 DIAGNOSIS — R531 Weakness: Secondary | ICD-10-CM | POA: Diagnosis present

## 2015-04-30 DIAGNOSIS — R4182 Altered mental status, unspecified: Secondary | ICD-10-CM | POA: Diagnosis present

## 2015-04-30 DIAGNOSIS — I69354 Hemiplegia and hemiparesis following cerebral infarction affecting left non-dominant side: Secondary | ICD-10-CM | POA: Diagnosis not present

## 2015-04-30 DIAGNOSIS — Z961 Presence of intraocular lens: Secondary | ICD-10-CM | POA: Diagnosis present

## 2015-04-30 DIAGNOSIS — Z993 Dependence on wheelchair: Secondary | ICD-10-CM | POA: Diagnosis not present

## 2015-04-30 DIAGNOSIS — F10239 Alcohol dependence with withdrawal, unspecified: Secondary | ICD-10-CM | POA: Diagnosis present

## 2015-04-30 DIAGNOSIS — D696 Thrombocytopenia, unspecified: Secondary | ICD-10-CM | POA: Diagnosis present

## 2015-04-30 DIAGNOSIS — I2510 Atherosclerotic heart disease of native coronary artery without angina pectoris: Secondary | ICD-10-CM | POA: Diagnosis present

## 2015-04-30 DIAGNOSIS — K7010 Alcoholic hepatitis without ascites: Secondary | ICD-10-CM | POA: Diagnosis present

## 2015-04-30 DIAGNOSIS — F10231 Alcohol dependence with withdrawal delirium: Secondary | ICD-10-CM | POA: Diagnosis not present

## 2015-04-30 DIAGNOSIS — F10929 Alcohol use, unspecified with intoxication, unspecified: Secondary | ICD-10-CM | POA: Insufficient documentation

## 2015-04-30 DIAGNOSIS — Z7189 Other specified counseling: Secondary | ICD-10-CM | POA: Insufficient documentation

## 2015-04-30 DIAGNOSIS — E860 Dehydration: Secondary | ICD-10-CM | POA: Diagnosis present

## 2015-04-30 DIAGNOSIS — F10230 Alcohol dependence with withdrawal, uncomplicated: Secondary | ICD-10-CM | POA: Diagnosis not present

## 2015-04-30 DIAGNOSIS — Z87891 Personal history of nicotine dependence: Secondary | ICD-10-CM

## 2015-04-30 DIAGNOSIS — I4510 Unspecified right bundle-branch block: Secondary | ICD-10-CM | POA: Diagnosis present

## 2015-04-30 DIAGNOSIS — Z7982 Long term (current) use of aspirin: Secondary | ICD-10-CM | POA: Diagnosis not present

## 2015-04-30 DIAGNOSIS — F10939 Alcohol use, unspecified with withdrawal, unspecified: Secondary | ICD-10-CM | POA: Diagnosis present

## 2015-04-30 DIAGNOSIS — E785 Hyperlipidemia, unspecified: Secondary | ICD-10-CM | POA: Diagnosis present

## 2015-04-30 DIAGNOSIS — Z515 Encounter for palliative care: Secondary | ICD-10-CM | POA: Insufficient documentation

## 2015-04-30 DIAGNOSIS — E872 Acidosis: Secondary | ICD-10-CM | POA: Diagnosis present

## 2015-04-30 DIAGNOSIS — Y907 Blood alcohol level of 200-239 mg/100 ml: Secondary | ICD-10-CM | POA: Diagnosis present

## 2015-04-30 DIAGNOSIS — I5023 Acute on chronic systolic (congestive) heart failure: Secondary | ICD-10-CM | POA: Diagnosis present

## 2015-04-30 DIAGNOSIS — D638 Anemia in other chronic diseases classified elsewhere: Secondary | ICD-10-CM | POA: Diagnosis present

## 2015-04-30 DIAGNOSIS — M7989 Other specified soft tissue disorders: Secondary | ICD-10-CM | POA: Diagnosis not present

## 2015-04-30 DIAGNOSIS — Z9841 Cataract extraction status, right eye: Secondary | ICD-10-CM

## 2015-04-30 DIAGNOSIS — Z79899 Other long term (current) drug therapy: Secondary | ICD-10-CM

## 2015-04-30 DIAGNOSIS — R600 Localized edema: Secondary | ICD-10-CM

## 2015-04-30 DIAGNOSIS — E43 Unspecified severe protein-calorie malnutrition: Secondary | ICD-10-CM | POA: Diagnosis present

## 2015-04-30 DIAGNOSIS — I426 Alcoholic cardiomyopathy: Secondary | ICD-10-CM | POA: Diagnosis present

## 2015-04-30 DIAGNOSIS — Z9114 Patient's other noncompliance with medication regimen: Secondary | ICD-10-CM | POA: Diagnosis not present

## 2015-04-30 DIAGNOSIS — I739 Peripheral vascular disease, unspecified: Secondary | ICD-10-CM | POA: Diagnosis not present

## 2015-04-30 DIAGNOSIS — Z8701 Personal history of pneumonia (recurrent): Secondary | ICD-10-CM

## 2015-04-30 DIAGNOSIS — R000 Tachycardia, unspecified: Secondary | ICD-10-CM | POA: Diagnosis present

## 2015-04-30 DIAGNOSIS — G9340 Encephalopathy, unspecified: Secondary | ICD-10-CM | POA: Diagnosis present

## 2015-04-30 DIAGNOSIS — D689 Coagulation defect, unspecified: Secondary | ICD-10-CM | POA: Diagnosis present

## 2015-04-30 DIAGNOSIS — J9601 Acute respiratory failure with hypoxia: Secondary | ICD-10-CM | POA: Diagnosis present

## 2015-04-30 DIAGNOSIS — I480 Paroxysmal atrial fibrillation: Secondary | ICD-10-CM | POA: Diagnosis present

## 2015-04-30 DIAGNOSIS — Z8249 Family history of ischemic heart disease and other diseases of the circulatory system: Secondary | ICD-10-CM

## 2015-04-30 DIAGNOSIS — E1151 Type 2 diabetes mellitus with diabetic peripheral angiopathy without gangrene: Secondary | ICD-10-CM | POA: Diagnosis present

## 2015-04-30 DIAGNOSIS — M25531 Pain in right wrist: Secondary | ICD-10-CM | POA: Diagnosis present

## 2015-04-30 DIAGNOSIS — G8929 Other chronic pain: Secondary | ICD-10-CM | POA: Diagnosis present

## 2015-04-30 DIAGNOSIS — I248 Other forms of acute ischemic heart disease: Secondary | ICD-10-CM | POA: Diagnosis present

## 2015-04-30 DIAGNOSIS — N179 Acute kidney failure, unspecified: Secondary | ICD-10-CM | POA: Diagnosis present

## 2015-04-30 DIAGNOSIS — R569 Unspecified convulsions: Secondary | ICD-10-CM | POA: Diagnosis present

## 2015-04-30 DIAGNOSIS — I10 Essential (primary) hypertension: Secondary | ICD-10-CM | POA: Diagnosis present

## 2015-04-30 DIAGNOSIS — Z833 Family history of diabetes mellitus: Secondary | ICD-10-CM | POA: Diagnosis not present

## 2015-04-30 DIAGNOSIS — M545 Low back pain: Secondary | ICD-10-CM | POA: Diagnosis present

## 2015-04-30 DIAGNOSIS — I213 ST elevation (STEMI) myocardial infarction of unspecified site: Secondary | ICD-10-CM | POA: Diagnosis not present

## 2015-04-30 DIAGNOSIS — Z951 Presence of aortocoronary bypass graft: Secondary | ICD-10-CM | POA: Diagnosis not present

## 2015-04-30 DIAGNOSIS — J449 Chronic obstructive pulmonary disease, unspecified: Secondary | ICD-10-CM | POA: Diagnosis present

## 2015-04-30 DIAGNOSIS — J410 Simple chronic bronchitis: Secondary | ICD-10-CM | POA: Diagnosis not present

## 2015-04-30 DIAGNOSIS — F10288 Alcohol dependence with other alcohol-induced disorder: Secondary | ICD-10-CM | POA: Diagnosis not present

## 2015-04-30 DIAGNOSIS — M6281 Muscle weakness (generalized): Secondary | ICD-10-CM | POA: Diagnosis not present

## 2015-04-30 DIAGNOSIS — Z9842 Cataract extraction status, left eye: Secondary | ICD-10-CM

## 2015-04-30 DIAGNOSIS — R748 Abnormal levels of other serum enzymes: Secondary | ICD-10-CM | POA: Diagnosis present

## 2015-04-30 DIAGNOSIS — R2689 Other abnormalities of gait and mobility: Secondary | ICD-10-CM | POA: Diagnosis not present

## 2015-04-30 DIAGNOSIS — R52 Pain, unspecified: Secondary | ICD-10-CM

## 2015-04-30 DIAGNOSIS — F10129 Alcohol abuse with intoxication, unspecified: Secondary | ICD-10-CM | POA: Diagnosis not present

## 2015-04-30 DIAGNOSIS — I25810 Atherosclerosis of coronary artery bypass graft(s) without angina pectoris: Secondary | ICD-10-CM | POA: Diagnosis not present

## 2015-04-30 DIAGNOSIS — R7989 Other specified abnormal findings of blood chemistry: Secondary | ICD-10-CM | POA: Diagnosis not present

## 2015-04-30 HISTORY — DX: Unspecified atrial flutter: I48.92

## 2015-04-30 HISTORY — DX: Cardiac arrest, cause unspecified: I46.9

## 2015-04-30 HISTORY — DX: Essential (primary) hypertension: I10

## 2015-04-30 HISTORY — DX: Paroxysmal atrial fibrillation: I48.0

## 2015-04-30 HISTORY — DX: Atherosclerotic heart disease of native coronary artery without angina pectoris: I25.10

## 2015-04-30 HISTORY — DX: Unspecified protein-calorie malnutrition: E46

## 2015-04-30 HISTORY — DX: Personal history of transient ischemic attack (TIA), and cerebral infarction without residual deficits: Z86.73

## 2015-04-30 HISTORY — DX: Coagulation defect, unspecified: D68.9

## 2015-04-30 HISTORY — DX: Type 2 diabetes mellitus without complications: E11.9

## 2015-04-30 LAB — ETHANOL: Alcohol, Ethyl (B): 229 mg/dL — ABNORMAL HIGH (ref <5)

## 2015-04-30 LAB — COMPREHENSIVE METABOLIC PANEL
ALBUMIN: 3.9 g/dL (ref 3.5–5.0)
ALK PHOS: 113 U/L (ref 38–126)
ALT: 24 U/L (ref 17–63)
AST: 34 U/L (ref 15–41)
Anion gap: 11 (ref 5–15)
BUN: 20 mg/dL (ref 6–20)
CALCIUM: 9.1 mg/dL (ref 8.9–10.3)
CO2: 24 mmol/L (ref 22–32)
CREATININE: 1 mg/dL (ref 0.61–1.24)
Chloride: 109 mmol/L (ref 101–111)
GFR calc Af Amer: >60 mL/min (ref >60)
GFR calc non Af Amer: >60 mL/min (ref >60)
GLUCOSE: 167 mg/dL — AB (ref 65–99)
Potassium: 4.4 mmol/L (ref 3.5–5.1)
SODIUM: 144 mmol/L (ref 135–145)
Total Bilirubin: 0.9 mg/dL (ref 0.3–1.2)
Total Protein: 7.9 g/dL (ref 6.5–8.1)

## 2015-04-30 LAB — CBC WITH DIFFERENTIAL/PLATELET
BASOS PCT: 0 %
Basophils Absolute: 0 10*3/uL (ref 0.0–0.1)
EOS ABS: 0.1 10*3/uL (ref 0.0–0.7)
Eosinophils Relative: 2 %
HEMATOCRIT: 32.3 % — AB (ref 39.0–52.0)
Hemoglobin: 11 g/dL — ABNORMAL LOW (ref 13.0–17.0)
Lymphocytes Relative: 22 %
Lymphs Abs: 1.2 10*3/uL (ref 0.7–4.0)
MCH: 33.1 pg (ref 26.0–34.0)
MCHC: 34.1 g/dL (ref 30.0–36.0)
MCV: 97.3 fL (ref 78.0–100.0)
MONO ABS: 0.4 10*3/uL (ref 0.1–1.0)
Monocytes Relative: 8 %
NEUTROS ABS: 3.9 10*3/uL (ref 1.7–7.7)
Neutrophils Relative %: 68 %
Platelets: 112 10*3/uL — ABNORMAL LOW (ref 150–400)
RBC: 3.32 MIL/uL — ABNORMAL LOW (ref 4.22–5.81)
RDW: 14.1 % (ref 11.5–15.5)
WBC: 5.6 10*3/uL (ref 4.0–10.5)

## 2015-04-30 LAB — I-STAT TROPONIN, ED: TROPONIN I, POC: 0.04 ng/mL (ref 0.00–0.08)

## 2015-04-30 LAB — BRAIN NATRIURETIC PEPTIDE: B Natriuretic Peptide: 39.5 pg/mL (ref 0.0–100.0)

## 2015-04-30 LAB — MAGNESIUM: Magnesium: 1.6 mg/dL — ABNORMAL LOW (ref 1.7–2.4)

## 2015-04-30 MED ORDER — SODIUM CHLORIDE 0.9 % IV BOLUS (SEPSIS)
500.0000 mL | Freq: Once | INTRAVENOUS | Status: AC
  Administered 2015-04-30: 500 mL via INTRAVENOUS
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
## 2015-04-30 NOTE — ED Notes (Signed)
2 unsuccessful IV attempts. Another nurse will attempt

## 2015-04-30 NOTE — ED Notes (Signed)
[facility] Expected date: 04/30/15 Expected time: 8:29 PM Means of arrival: Ambulance Comments: 81 yo M  Unable to ambulate

## 2015-04-30 NOTE — ED Notes (Signed)
Case manager at bedside 
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
## 2015-04-30 NOTE — ED Notes (Signed)
Pt taken to room 33 to be thoroughly bathed.

## 2015-05-01 ENCOUNTER — Observation Stay (HOSPITAL_COMMUNITY): Payer: Commercial Managed Care - HMO

## 2015-05-01 ENCOUNTER — Encounter (HOSPITAL_COMMUNITY): Payer: Self-pay | Admitting: Internal Medicine

## 2015-05-01 DIAGNOSIS — R600 Localized edema: Secondary | ICD-10-CM

## 2015-05-01 DIAGNOSIS — I69354 Hemiplegia and hemiparesis following cerebral infarction affecting left non-dominant side: Secondary | ICD-10-CM | POA: Diagnosis not present

## 2015-05-01 DIAGNOSIS — F10239 Alcohol dependence with withdrawal, unspecified: Secondary | ICD-10-CM | POA: Diagnosis present

## 2015-05-01 DIAGNOSIS — J410 Simple chronic bronchitis: Secondary | ICD-10-CM

## 2015-05-01 DIAGNOSIS — Z9842 Cataract extraction status, left eye: Secondary | ICD-10-CM | POA: Diagnosis not present

## 2015-05-01 DIAGNOSIS — E1151 Type 2 diabetes mellitus with diabetic peripheral angiopathy without gangrene: Secondary | ICD-10-CM

## 2015-05-01 DIAGNOSIS — I5023 Acute on chronic systolic (congestive) heart failure: Secondary | ICD-10-CM | POA: Diagnosis present

## 2015-05-01 DIAGNOSIS — Y907 Blood alcohol level of 200-239 mg/100 ml: Secondary | ICD-10-CM | POA: Diagnosis present

## 2015-05-01 DIAGNOSIS — I426 Alcoholic cardiomyopathy: Secondary | ICD-10-CM | POA: Diagnosis present

## 2015-05-01 DIAGNOSIS — R7989 Other specified abnormal findings of blood chemistry: Secondary | ICD-10-CM | POA: Diagnosis not present

## 2015-05-01 DIAGNOSIS — G8929 Other chronic pain: Secondary | ICD-10-CM | POA: Diagnosis present

## 2015-05-01 DIAGNOSIS — M1990 Unspecified osteoarthritis, unspecified site: Secondary | ICD-10-CM | POA: Diagnosis present

## 2015-05-01 DIAGNOSIS — E46 Unspecified protein-calorie malnutrition: Secondary | ICD-10-CM | POA: Diagnosis not present

## 2015-05-01 DIAGNOSIS — Z515 Encounter for palliative care: Secondary | ICD-10-CM | POA: Diagnosis present

## 2015-05-01 DIAGNOSIS — I213 ST elevation (STEMI) myocardial infarction of unspecified site: Secondary | ICD-10-CM

## 2015-05-01 DIAGNOSIS — N179 Acute kidney failure, unspecified: Secondary | ICD-10-CM | POA: Diagnosis present

## 2015-05-01 DIAGNOSIS — R4182 Altered mental status, unspecified: Secondary | ICD-10-CM | POA: Diagnosis present

## 2015-05-01 DIAGNOSIS — F10229 Alcohol dependence with intoxication, unspecified: Secondary | ICD-10-CM | POA: Diagnosis present

## 2015-05-01 DIAGNOSIS — D696 Thrombocytopenia, unspecified: Secondary | ICD-10-CM | POA: Diagnosis present

## 2015-05-01 DIAGNOSIS — I25810 Atherosclerosis of coronary artery bypass graft(s) without angina pectoris: Secondary | ICD-10-CM | POA: Diagnosis not present

## 2015-05-01 DIAGNOSIS — R000 Tachycardia, unspecified: Secondary | ICD-10-CM | POA: Diagnosis present

## 2015-05-01 DIAGNOSIS — F10230 Alcohol dependence with withdrawal, uncomplicated: Secondary | ICD-10-CM | POA: Diagnosis not present

## 2015-05-01 DIAGNOSIS — I10 Essential (primary) hypertension: Secondary | ICD-10-CM | POA: Diagnosis present

## 2015-05-01 DIAGNOSIS — R531 Weakness: Secondary | ICD-10-CM | POA: Diagnosis present

## 2015-05-01 DIAGNOSIS — Z8701 Personal history of pneumonia (recurrent): Secondary | ICD-10-CM | POA: Diagnosis not present

## 2015-05-01 DIAGNOSIS — J9601 Acute respiratory failure with hypoxia: Secondary | ICD-10-CM | POA: Diagnosis present

## 2015-05-01 DIAGNOSIS — E860 Dehydration: Secondary | ICD-10-CM | POA: Diagnosis present

## 2015-05-01 DIAGNOSIS — E872 Acidosis: Secondary | ICD-10-CM | POA: Diagnosis present

## 2015-05-01 DIAGNOSIS — G9340 Encephalopathy, unspecified: Secondary | ICD-10-CM | POA: Diagnosis present

## 2015-05-01 DIAGNOSIS — Z8249 Family history of ischemic heart disease and other diseases of the circulatory system: Secondary | ICD-10-CM | POA: Diagnosis not present

## 2015-05-01 DIAGNOSIS — E43 Unspecified severe protein-calorie malnutrition: Secondary | ICD-10-CM | POA: Diagnosis present

## 2015-05-01 DIAGNOSIS — Z9114 Patient's other noncompliance with medication regimen: Secondary | ICD-10-CM | POA: Diagnosis not present

## 2015-05-01 DIAGNOSIS — K7010 Alcoholic hepatitis without ascites: Secondary | ICD-10-CM | POA: Diagnosis present

## 2015-05-01 DIAGNOSIS — Z79899 Other long term (current) drug therapy: Secondary | ICD-10-CM | POA: Diagnosis not present

## 2015-05-01 DIAGNOSIS — I2510 Atherosclerotic heart disease of native coronary artery without angina pectoris: Secondary | ICD-10-CM | POA: Diagnosis present

## 2015-05-01 DIAGNOSIS — Z961 Presence of intraocular lens: Secondary | ICD-10-CM | POA: Diagnosis present

## 2015-05-01 DIAGNOSIS — J449 Chronic obstructive pulmonary disease, unspecified: Secondary | ICD-10-CM | POA: Diagnosis present

## 2015-05-01 DIAGNOSIS — E785 Hyperlipidemia, unspecified: Secondary | ICD-10-CM | POA: Diagnosis present

## 2015-05-01 DIAGNOSIS — I248 Other forms of acute ischemic heart disease: Secondary | ICD-10-CM | POA: Diagnosis present

## 2015-05-01 DIAGNOSIS — R748 Abnormal levels of other serum enzymes: Secondary | ICD-10-CM | POA: Diagnosis present

## 2015-05-01 DIAGNOSIS — F10231 Alcohol dependence with withdrawal delirium: Secondary | ICD-10-CM | POA: Diagnosis not present

## 2015-05-01 DIAGNOSIS — Z993 Dependence on wheelchair: Secondary | ICD-10-CM | POA: Diagnosis not present

## 2015-05-01 DIAGNOSIS — R569 Unspecified convulsions: Secondary | ICD-10-CM | POA: Diagnosis present

## 2015-05-01 DIAGNOSIS — Z7982 Long term (current) use of aspirin: Secondary | ICD-10-CM | POA: Diagnosis not present

## 2015-05-01 DIAGNOSIS — Z833 Family history of diabetes mellitus: Secondary | ICD-10-CM | POA: Diagnosis not present

## 2015-05-01 DIAGNOSIS — F10129 Alcohol abuse with intoxication, unspecified: Secondary | ICD-10-CM | POA: Diagnosis not present

## 2015-05-01 DIAGNOSIS — M545 Low back pain: Secondary | ICD-10-CM | POA: Diagnosis present

## 2015-05-01 DIAGNOSIS — R05 Cough: Secondary | ICD-10-CM | POA: Diagnosis not present

## 2015-05-01 DIAGNOSIS — D638 Anemia in other chronic diseases classified elsewhere: Secondary | ICD-10-CM | POA: Diagnosis present

## 2015-05-01 DIAGNOSIS — Z951 Presence of aortocoronary bypass graft: Secondary | ICD-10-CM | POA: Diagnosis not present

## 2015-05-01 DIAGNOSIS — I4510 Unspecified right bundle-branch block: Secondary | ICD-10-CM | POA: Diagnosis present

## 2015-05-01 DIAGNOSIS — I5022 Chronic systolic (congestive) heart failure: Secondary | ICD-10-CM | POA: Diagnosis not present

## 2015-05-01 DIAGNOSIS — D689 Coagulation defect, unspecified: Secondary | ICD-10-CM | POA: Diagnosis present

## 2015-05-01 DIAGNOSIS — Z87891 Personal history of nicotine dependence: Secondary | ICD-10-CM | POA: Diagnosis not present

## 2015-05-01 DIAGNOSIS — M25531 Pain in right wrist: Secondary | ICD-10-CM | POA: Diagnosis present

## 2015-05-01 DIAGNOSIS — R5381 Other malaise: Secondary | ICD-10-CM | POA: Insufficient documentation

## 2015-05-01 DIAGNOSIS — Z8674 Personal history of sudden cardiac arrest: Secondary | ICD-10-CM | POA: Diagnosis not present

## 2015-05-01 DIAGNOSIS — Z7189 Other specified counseling: Secondary | ICD-10-CM | POA: Diagnosis not present

## 2015-05-01 DIAGNOSIS — I480 Paroxysmal atrial fibrillation: Secondary | ICD-10-CM | POA: Diagnosis present

## 2015-05-01 DIAGNOSIS — Z9841 Cataract extraction status, right eye: Secondary | ICD-10-CM | POA: Diagnosis not present

## 2015-05-01 LAB — URINE MICROSCOPIC-ADD ON

## 2015-05-01 LAB — URINALYSIS, ROUTINE W REFLEX MICROSCOPIC
Bilirubin Urine: NEGATIVE
GLUCOSE, UA: NEGATIVE mg/dL
Ketones, ur: NEGATIVE mg/dL
Nitrite: NEGATIVE
PH: 5 (ref 5.0–8.0)
Protein, ur: 30 mg/dL — AB
SPECIFIC GRAVITY, URINE: 1.022 (ref 1.005–1.030)

## 2015-05-01 LAB — COMPREHENSIVE METABOLIC PANEL
ALBUMIN: 3.7 g/dL (ref 3.5–5.0)
ALK PHOS: 105 U/L (ref 38–126)
ALT: 23 U/L (ref 17–63)
ANION GAP: 15 (ref 5–15)
AST: 38 U/L (ref 15–41)
BUN: 13 mg/dL (ref 6–20)
CALCIUM: 8.9 mg/dL (ref 8.9–10.3)
CO2: 21 mmol/L — AB (ref 22–32)
CREATININE: 0.84 mg/dL (ref 0.61–1.24)
Chloride: 107 mmol/L (ref 101–111)
GFR calc non Af Amer: >60 mL/min (ref >60)
Glucose, Bld: 138 mg/dL — ABNORMAL HIGH (ref 65–99)
Potassium: 3.8 mmol/L (ref 3.5–5.1)
SODIUM: 143 mmol/L (ref 135–145)
Total Bilirubin: 1.7 mg/dL — ABNORMAL HIGH (ref 0.3–1.2)
Total Protein: 7.6 g/dL (ref 6.5–8.1)

## 2015-05-01 LAB — CBG MONITORING, ED
GLUCOSE-CAPILLARY: 96 mg/dL (ref 65–99)
Glucose-Capillary: 147 mg/dL — ABNORMAL HIGH (ref 65–99)

## 2015-05-01 LAB — TROPONIN I
TROPONIN I: 0.87 ng/mL — AB (ref <0.031)
Troponin I: 0.63 ng/mL (ref <0.031)
Troponin I: 0.42 ng/mL — ABNORMAL HIGH (ref <0.031)

## 2015-05-01 LAB — PROTIME-INR
INR: 1.24 (ref 0.00–1.49)
PROTHROMBIN TIME: 15.8 s — AB (ref 11.6–15.2)

## 2015-05-01 LAB — CBC
HCT: 32.8 % — ABNORMAL LOW (ref 39.0–52.0)
Hemoglobin: 11 g/dL — ABNORMAL LOW (ref 13.0–17.0)
MCH: 32.9 pg (ref 26.0–34.0)
MCHC: 33.5 g/dL (ref 30.0–36.0)
MCV: 98.2 fL (ref 78.0–100.0)
PLATELETS: 102 10*3/uL — AB (ref 150–400)
RBC: 3.34 MIL/uL — AB (ref 4.22–5.81)
RDW: 14.2 % (ref 11.5–15.5)
WBC: 9.1 10*3/uL (ref 4.0–10.5)

## 2015-05-01 LAB — GLUCOSE, CAPILLARY: GLUCOSE-CAPILLARY: 138 mg/dL — AB (ref 65–99)

## 2015-05-01 LAB — PHOSPHORUS: PHOSPHORUS: 2.1 mg/dL — AB (ref 2.5–4.6)

## 2015-05-01 LAB — PREALBUMIN: PREALBUMIN: 30.7 mg/dL (ref 18–38)

## 2015-05-01 LAB — I-STAT CG4 LACTIC ACID, ED: Lactic Acid, Venous: 2.21 mmol/L (ref 0.5–2.0)

## 2015-05-01 LAB — MRSA PCR SCREENING: MRSA by PCR: NEGATIVE

## 2015-05-01 LAB — LACTIC ACID, PLASMA
Lactic Acid, Venous: 4.3 mmol/L (ref 0.5–2.0)
Lactic Acid, Venous: 3.9 mmol/L (ref 0.5–2.0)
Lactic Acid, Venous: 1.5 mmol/L (ref 0.5–2.0)

## 2015-05-01 LAB — TSH: TSH: 2.322 u[IU]/mL (ref 0.350–4.500)

## 2015-05-01 LAB — MAGNESIUM: MAGNESIUM: 1.7 mg/dL (ref 1.7–2.4)

## 2015-05-01 LAB — CK: Total CK: 116 U/L (ref 49–397)

## 2015-05-01 MED ORDER — ACETAMINOPHEN 650 MG RE SUPP: 650.0000 mg | Freq: Four times a day (QID) | RECTAL | Status: DC | PRN

## 2015-05-01 MED ORDER — INSULIN ASPART 100 UNIT/ML ~~LOC~~ SOLN: 0.0000 [IU] | Freq: Every day | SUBCUTANEOUS | Status: DC

## 2015-05-01 MED ORDER — TAMSULOSIN HCL 0.4 MG PO CAPS
0.4000 mg | ORAL_CAPSULE | Freq: Every day | ORAL | Status: DC
  Administered 2015-05-01 – 2015-05-03 (×3): 0.4 mg via ORAL
  Filled 2015-05-01 (×5): qty 1

## 2015-05-01 MED ORDER — PERFLUTREN LIPID MICROSPHERE
INTRAVENOUS | Status: AC
  Filled 2015-05-01: qty 10

## 2015-05-01 MED ORDER — POTASSIUM PHOSPHATE MONOBASIC 500 MG PO TABS
500.0000 mg | ORAL_TABLET | Freq: Two times a day (BID) | ORAL | Status: DC
  Filled 2015-05-01 (×2): qty 1

## 2015-05-01 MED ORDER — ACETAMINOPHEN 325 MG PO TABS: 650.0000 mg | ORAL_TABLET | Freq: Four times a day (QID) | ORAL | Status: DC | PRN

## 2015-05-01 MED ORDER — SODIUM CHLORIDE 0.9 % IJ SOLN
3.0000 mL | Freq: Two times a day (BID) | INTRAMUSCULAR | Status: DC
  Administered 2015-05-01 – 2015-05-08 (×11): 3 mL via INTRAVENOUS

## 2015-05-01 MED ORDER — HYDRALAZINE HCL 20 MG/ML IJ SOLN
10.0000 mg | Freq: Four times a day (QID) | INTRAMUSCULAR | Status: DC | PRN
  Administered 2015-05-01 (×2): 10 mg via INTRAVENOUS
  Filled 2015-05-01 (×2): qty 1

## 2015-05-01 MED ORDER — ONDANSETRON HCL 4 MG/2ML IJ SOLN
4.0000 mg | Freq: Four times a day (QID) | INTRAMUSCULAR | Status: DC | PRN
Start: 2015-05-01 — End: 2015-05-08

## 2015-05-01 MED ORDER — INSULIN ASPART 100 UNIT/ML ~~LOC~~ SOLN
0.0000 [IU] | Freq: Three times a day (TID) | SUBCUTANEOUS | Status: DC
  Administered 2015-05-01: 1 [IU] via SUBCUTANEOUS
  Administered 2015-05-02: 2 [IU] via SUBCUTANEOUS
  Administered 2015-05-02 – 2015-05-07 (×4): 1 [IU] via SUBCUTANEOUS
  Administered 2015-05-07: 2 [IU] via SUBCUTANEOUS
  Administered 2015-05-08: 1 [IU] via SUBCUTANEOUS
  Administered 2015-05-08: 2 [IU] via SUBCUTANEOUS
  Filled 2015-05-01: qty 1

## 2015-05-01 MED ORDER — ONDANSETRON HCL 4 MG PO TABS: 4.0000 mg | ORAL_TABLET | Freq: Four times a day (QID) | ORAL | Status: DC | PRN

## 2015-05-01 MED ORDER — PERFLUTREN LIPID MICROSPHERE
1.0000 mL | INTRAVENOUS | Status: AC | PRN
  Administered 2015-05-01: 2 mL via INTRAVENOUS
  Filled 2015-05-01: qty 10

## 2015-05-01 MED ORDER — MAGNESIUM SULFATE IN D5W 10-5 MG/ML-% IV SOLN
1.0000 g | Freq: Once | INTRAVENOUS | Status: AC
  Administered 2015-05-01: 1 g via INTRAVENOUS
  Filled 2015-05-01: qty 100

## 2015-05-01 MED ORDER — IPRATROPIUM BROMIDE 0.02 % IN SOLN: 0.5000 mg | RESPIRATORY_TRACT | Status: DC | PRN

## 2015-05-01 MED ORDER — IPRATROPIUM BROMIDE 0.02 % IN SOLN
0.5000 mg | Freq: Four times a day (QID) | RESPIRATORY_TRACT | Status: DC
  Administered 2015-05-01 (×2): 0.5 mg via RESPIRATORY_TRACT
  Filled 2015-05-01 (×2): qty 2.5

## 2015-05-01 MED ORDER — ASPIRIN EC 81 MG PO TBEC
81.0000 mg | DELAYED_RELEASE_TABLET | Freq: Every day | ORAL | Status: DC
  Administered 2015-05-01 – 2015-05-08 (×6): 81 mg via ORAL
  Filled 2015-05-01 (×8): qty 1

## 2015-05-01 MED ORDER — HYDRALAZINE HCL 20 MG/ML IJ SOLN
10.0000 mg | INTRAMUSCULAR | Status: DC | PRN
Start: 2015-05-01 — End: 2015-05-06
  Administered 2015-05-01 – 2015-05-04 (×5): 10 mg via INTRAVENOUS
  Filled 2015-05-01 (×5): qty 1

## 2015-05-01 MED ORDER — LORAZEPAM 2 MG/ML IJ SOLN
2.0000 mg | INTRAMUSCULAR | Status: DC | PRN
  Administered 2015-05-01 – 2015-05-03 (×5): 2 mg via INTRAVENOUS
  Administered 2015-05-03: 3 mg via INTRAVENOUS
  Administered 2015-05-03: 2 mg via INTRAVENOUS
  Administered 2015-05-03 (×2): 3 mg via INTRAVENOUS
  Administered 2015-05-03 – 2015-05-04 (×6): 2 mg via INTRAVENOUS
  Filled 2015-05-01 (×4): qty 1
  Filled 2015-05-01: qty 2
  Filled 2015-05-01 (×4): qty 1
  Filled 2015-05-01: qty 2
  Filled 2015-05-01: qty 1
  Filled 2015-05-01 (×2): qty 2
  Filled 2015-05-01 (×3): qty 1

## 2015-05-01 MED ORDER — LEVALBUTEROL HCL 0.63 MG/3ML IN NEBU: 0.6300 mg | INHALATION_SOLUTION | RESPIRATORY_TRACT | Status: DC | PRN

## 2015-05-01 MED ORDER — SODIUM CHLORIDE 0.9 % IV BOLUS (SEPSIS)
500.0000 mL | Freq: Once | INTRAVENOUS | Status: AC
  Administered 2015-05-01: 500 mL via INTRAVENOUS

## 2015-05-01 MED ORDER — MAGNESIUM SULFATE 50 % IJ SOLN: 1.0000 g | Freq: Once | INTRAMUSCULAR | Status: DC

## 2015-05-01 MED ORDER — SODIUM CHLORIDE 0.9 % IV SOLN
INTRAVENOUS | Status: DC
  Administered 2015-05-01: 1000 mL via INTRAVENOUS

## 2015-05-01 MED ORDER — METOPROLOL TARTRATE 50 MG PO TABS
75.0000 mg | ORAL_TABLET | Freq: Two times a day (BID) | ORAL | Status: DC
  Administered 2015-05-01 – 2015-05-03 (×5): 75 mg via ORAL
  Filled 2015-05-01 (×2): qty 1
  Filled 2015-05-01 (×2): qty 3
  Filled 2015-05-01: qty 1
  Filled 2015-05-01 (×4): qty 3

## 2015-05-01 MED ORDER — VITAMIN B-1 100 MG PO TABS
100.0000 mg | ORAL_TABLET | Freq: Every day | ORAL | Status: DC
Start: 2015-05-01 — End: 2015-05-05
  Administered 2015-05-01 – 2015-05-03 (×3): 100 mg via ORAL
  Filled 2015-05-01 (×5): qty 1

## 2015-05-01 MED ORDER — FOLIC ACID 1 MG PO TABS
1.0000 mg | ORAL_TABLET | Freq: Every day | ORAL | Status: DC
  Administered 2015-05-01 – 2015-05-03 (×3): 1 mg via ORAL
  Filled 2015-05-01 (×5): qty 1

## 2015-05-01 MED ORDER — SODIUM CHLORIDE 0.9 % IV SOLN
INTRAVENOUS | Status: DC
  Administered 2015-05-01: 10:00:00 via INTRAVENOUS

## 2015-05-01 MED ORDER — THIAMINE HCL 100 MG/ML IJ SOLN
100.0000 mg | Freq: Once | INTRAMUSCULAR | Status: AC
  Administered 2015-05-01: 100 mg via INTRAVENOUS
  Filled 2015-05-01: qty 2

## 2015-05-01 MED ORDER — LORAZEPAM 2 MG/ML IJ SOLN
0.5000 mg | Freq: Once | INTRAMUSCULAR | Status: AC
  Administered 2015-05-01: 0.5 mg via INTRAVENOUS
  Filled 2015-05-01: qty 1

## 2015-05-01 MED ORDER — SODIUM CHLORIDE 0.9 % IV BOLUS (SEPSIS)
1000.0000 mL | Freq: Once | INTRAVENOUS | Status: AC
  Administered 2015-05-01: 1000 mL via INTRAVENOUS

## 2015-05-01 MED ORDER — HYDROCODONE-ACETAMINOPHEN 5-325 MG PO TABS
1.0000 | ORAL_TABLET | ORAL | Status: DC | PRN
  Administered 2015-05-01: 2 via ORAL
  Administered 2015-05-03: 1 via ORAL
  Filled 2015-05-01 (×2): qty 2

## 2015-05-01 NOTE — ED Notes (Signed)
Writer attempted to collect urine thru the condom catheter, no urine in catheter bag.

## 2015-05-01 NOTE — ED Notes (Signed)
phlebotomy call to collect lab, MD notified of delay

## 2015-05-01 NOTE — H&P (Signed)
PCP:  Rogelia BogaKWIATKOWSKI,PETER FRANK, MD  GI which he needs  Referring provider Pecola Leisureeese   Chief Complaint:    HPI: Johnny [name] is a 81 y.o. male   has a past medical history of Acute alcoholic hepatitis (05/20/2010); Anemia of other chronic disease (08/16/2007); Atrial fibrillation (HCC) (08/12/2007); CORONARY ARTERY DISEASE (12/11/2006); HYPERTENSION (12/11/2006); Hyperlipidemia; ETOH abuse; Chronic low back pain; Pneumonia; Hallucination (04/01/2012); Exertional dyspnea; DIABETES MELLITUS, TYPE II (12/11/2006); Arthritis; Altered mental status (04/01/2012); and Seizures (HCC) (04/01/2012).   Presented with weakness and fatigue which she attributes to alcohol abuse. Patient was too weak to get up to use the bathroom and was found by EMS intervention urine and feces sitting on his wheelchair. Reports usually able to walk but has not since 2 days ago.  She states he started to drink again and stopped taking any of his medications. He is usually on metoprolol.  Patient lives alone and does not want any placement. Has history of diabetes it's mainly diet-controlled. Patient has known history of alcohol abuse. Has history of paroxysmal atrial fibrillation not on anticoagulation candidate secondary to alcohol abuse. EMS reports that they feel that the daughter has not been providing adequate care for her father. Patient has denied this. States his neighbore brings in his alcohol last drink was this AM [employer] department he was noted to be tachycardic up to 122 appear to be deconditioned and unable to stand up on his own. Tremulous worrisome for alcohol withdrawal  Regarding his cardiac history patient has known history of coronary artery disease status post CABG in 2003 denies any current chest pain or shortness of breath.  He has hx of CVA 2 years ago with residual left side weakness.  Hospitalist was called for admission for generalized weakness alcohol withdrawal  Review of Systems:    Pertinent positives  include: Generalized weakness, fatigue, tremor  Constitutional:  No weight loss, night sweats, Fevers, chillsweight loss  HEENT:  No headaches, Difficulty swallowing,Tooth/dental problems,Sore throat,  No sneezing, itching, ear ache, nasal congestion, post nasal drip,  Cardio-vascular:  No chest pain, Orthopnea, PND, anasarca, dizziness, palpitations.no Bilateral lower extremity swelling  GI:  No heartburn, indigestion, abdominal pain, nausea, vomiting, diarrhea, change in bowel habits, loss of appetite, melena, blood in stool, hematemesis Resp:  no shortness of breath at rest. No dyspnea on exertion, No excess mucus, no productive cough, No non-productive cough, No coughing up of blood.No change in color of mucus.No wheezing. Skin:  no rash or lesions. No jaundice GU:  no dysuria, change in color of urine, no urgency or frequency. No straining to urinate.  No flank pain.  Musculoskeletal:  No joint pain or no joint swelling. No decreased range of motion. No back pain.  Psych:  No change in mood or affect. No depression or anxiety. No memory loss.  Neuro: no localizing neurological complaints, no tingling, no weakness, no double vision, no gait abnormality, no slurred speech, no confusion  Otherwise ROS are negative except for above, 10 systems were reviewed  Past Medical History: Past Medical History  Diagnosis Date  . Acute alcoholic hepatitis 05/20/2010  . Anemia of other chronic disease 08/16/2007  . Atrial fibrillation (HCC) 08/12/2007  . CORONARY ARTERY DISEASE 12/11/2006  . HYPERTENSION 12/11/2006  . Hyperlipidemia   . ETOH abuse   . Chronic low back pain   . Pneumonia     "now; never before" (04/01/2012)  . Hallucination 04/01/2012  . Exertional dyspnea   . DIABETES MELLITUS, TYPE II 12/11/2006  .  Arthritis     "think that's what's in my back" (04/01/2012)  . Altered mental status 04/01/2012  . Seizures (HCC) 04/01/2012    seizure-like activity @ home today/notes  04/01/2012    Past Surgical History  Procedure Laterality Date  . Coronary artery bypass graft  ~ 2003    CABG X4  . Cataract extraction w/ intraocular lens  implant, bilateral      "years ago" (04/01/2012)  . Esophagogastroduodenoscopy  04/13/2012    Procedure: ESOPHAGOGASTRODUODENOSCOPY (EGD);  Surgeon: Florencia Reasons, MD;  Location[facility] ENDOSCOPY;  Service: Endoscopy;  Laterality: N/A;  push peg  . Peg placement  04/13/2012    Procedure: PERCUTANEOUS ENDOSCOPIC GASTROSTOMY (PEG) PLACEMENT;  Surgeon: Florencia Reasons, MD;  Location: [facility] ENDOSCOPY;  Service: Endoscopy;  Laterality: N/A;     Medications: Prior to Admission medications   Medication Sig Start Date End Date Taking? Authorizing Provider  ACCU-CHEK SOFTCLIX LANCETS lancets 1 each by Other route daily as needed for other. 06/20/13   Gordy Savers, MD  Alcohol Swabs (ALCOHOL PADS) 70 % PADS  10/28/12   Historical Provider, MD  aspirin 81 MG tablet Take 81 mg by mouth daily.    Historical Provider, MD  atorvastatin (LIPITOR) 20 MG tablet TAKE 1 TABLET (20 MG TOTAL) BY MOUTH DAILY. 11/24/14   Gordy Savers, MD  glucose blood (ACCU-CHEK AVIVA PLUS) test strip 1 each by Other route daily as needed for other. 06/20/13   Gordy Savers, MD  metoprolol (LOPRESSOR) 50 MG tablet TAKE 1.5 TABLETS (75 MG TOTAL) BY MOUTH 2 (TWO) TIMES DAILY. 04/19/15   Gordy Savers, MD  Multiple Vitamin (MULTIVITAMIN) tablet Take 1 tablet by mouth daily.    Historical Provider, MD  tamsulosin (FLOMAX) 0.4 MG CAPS capsule TAKE 1 CAPSULE (0.4 MG TOTAL) BY MOUTH DAILY. 12/26/14   Gordy Savers, MD    Allergies:  No Known Allergies  Social History:  Ambulatory  wheelchair bound  Lives at home alone,         reports that he quit smoking about 32 years ago. His smoking use included Cigarettes. He has a 8.5 pack-year smoking history. He has never used smokeless tobacco. He reports that he drinks about 10.8 oz of alcohol per week. He  reports that he does not use illicit drugs.     Family History: family history includes Aneurysm in his sister; Diabetes in his sister; Heart failure in his brother and mother; Suicidality in his brother.    Physical Exam: Patient Vitals for the past 24 hrs:  BP Temp Temp src Pulse Resp SpO2  04/30/15 2314 158/81 mmHg 98 F (36.7 C) Oral (!) 122 22 100 %  04/30/15 2139 157/78 mmHg 98.4 F (36.9 C) Oral 120 19 95 %    1. General:  in No Acute distress 2. Psychological: Alert and  Oriented 3. Head/ENT:    Dry Mucous Membranes                          Head Non traumatic, neck supple                           Poor Dentition 4. SKIN:   decreased Skin turgor,  Skin clean Dry and intact no rash no decubitus ulcers noted 5. Heart: Regular rate and rhythm no Murmur, Rub or gallop 6. Lungs: Clear to auscultation bilaterally, no wheezes or crackles  7. Abdomen: Soft, non-tender, Non distended 8. Lower extremities: no clubbing, cyanosis, positive bilaterally edema Left worse 9. Neurologically left side weakness chronic generalized tremor  10. MSK: Normal range of motion  body mass index is unknown because there is no weight on file.   Labs on Admission:   Results for orders placed or performed during the hospital encounter of 04/30/15 (from the past 24 hour(s))  Comprehensive metabolic panel     Status: Abnormal   Collection Time: 04/30/15 10:06 PM  Result Value Ref Range   Sodium 144 135 - 145 mmol/L   Potassium 4.4 3.5 - 5.1 mmol/L   Chloride 109 101 - 111 mmol/L   CO2 24 22 - 32 mmol/L   Glucose, Bld 167 (H) 65 - 99 mg/dL   BUN 20 6 - 20 mg/dL   Creatinine, Ser 1.61 0.61 - 1.24 mg/dL   Calcium 9.1 8.9 - 09.6 mg/dL   Total Protein 7.9 6.5 - 8.1 g/dL   Albumin 3.9 3.5 - 5.0 g/dL   AST 34 15 - 41 U/L   ALT 24 17 - 63 U/L   Alkaline Phosphatase 113 38 - 126 U/L   Total Bilirubin 0.9 0.3 - 1.2 mg/dL   GFR calc non Af Amer >60 >60 mL/min   GFR calc Af Amer >60 >60 mL/min    Anion gap 11 5 - 15  CBC with Differential     Status: Abnormal   Collection Time: 04/30/15 10:06 PM  Result Value Ref Range   WBC 5.6 4.0 - 10.5 K/uL   RBC 3.32 (L) 4.22 - 5.81 MIL/uL   Hemoglobin 11.0 (L) 13.0 - 17.0 g/dL   HCT 04.5 (L) 40.9 - 81.1 %   MCV 97.3 78.0 - 100.0 fL   MCH 33.1 26.0 - 34.0 pg   MCHC 34.1 30.0 - 36.0 g/dL   RDW 91.4 78.2 - 95.6 %   Platelets 112 (L) 150 - 400 K/uL   Neutrophils Relative % 68 %   Lymphocytes Relative 22 %   Monocytes Relative 8 %   Eosinophils Relative 2 %   Basophils Relative 0 %   Neutro Abs 3.9 1.7 - 7.7 K/uL   Lymphs Abs 1.2 0.7 - 4.0 K/uL   Monocytes Absolute 0.4 0.1 - 1.0 K/uL   Eosinophils Absolute 0.1 0.0 - 0.7 K/uL   Basophils Absolute 0.0 0.0 - 0.1 K/uL  Ethanol     Status: Abnormal   Collection Time: 04/30/15 10:06 PM  Result Value Ref Range   Alcohol, Ethyl (B) 229 (H) <5 mg/dL  Brain natriuretic peptide     Status: None   Collection Time: 04/30/15 10:06 PM  Result Value Ref Range   B Natriuretic Peptide 39.5 0.0 - 100.0 pg/mL  Magnesium     Status: Abnormal   Collection Time: 04/30/15 10:06 PM  Result Value Ref Range   Magnesium 1.6 (L) 1.7 - 2.4 mg/dL  I-stat troponin, ED     Status: None   Collection Time: 04/30/15 10:15 PM  Result Value Ref Range   Troponin i, poc 0.04 0.00 - 0.08 ng/mL   Comment 3            UA ordered  Lab Results  Component Value Date   HGBA1C 6.1* 12/08/2014    CrCl cannot be calculated (Unknown ideal weight.).  BNP (last 3 results) No results for input(s): PROBNP in the last 8760 hours.  Other results:  I have pearsonaly reviewed this: ECG REPORT  Rate:  123  Rhythm: Sinus tachycardia ST&T Change: No ischemic changes QTc 511  There were no vitals filed for this visit.   Cultures:    Component Value Date/Time   SDES TRACHEAL ASPIRATE 04/18/2012 1411   SPECREQUEST NONE 04/18/2012 1411   CULT FEW CANDIDA ALBICANS 04/18/2012 1411   REPTSTATUS 04/20/2012 FINAL 04/18/2012  1411     Radiological Exams on Admission: Dg Chest 2 View  04/30/2015  CLINICAL DATA:  Weakness. EXAM: CHEST  2 VIEW COMPARISON:  May 07, 2012. FINDINGS: The heart size and mediastinal contours are within normal limits. Both lungs are clear. No pneumothorax or pleural effusion is noted. Status post coronary artery bypass graft. The visualized skeletal structures are unremarkable. IMPRESSION: No active cardiopulmonary disease. Electronically Signed   By: Lupita Raider, M.D.   On: 04/30/2015 22:54    Chart has been reviewed  Family  at  Bedside    Assessment/Plan  81 year-old gentleman history of alcohol abuse and coronary disease being admitted for generalized deconditioning possible alcohol withdrawal  Present on Admission:  . Alcohol withdrawal (HCC) Will initiate CIWA protocol monitor on telemetry   . Atrial fibrillation (HCC)restart metoprolol likely tachycardia secondary to rebound tachycardia with discontinuation of metoprolol noted candidate for anticoagulation secondary to medical noncompliance and alcohol abuse   . COPD (chronic obstructive pulmonary disease), presumed currently appears to be stable make sure he is on Xopenex as needed and Atrovent  . Coronary atherosclerosis stable - will check lipid panel in the morning shift this point does not endorse any chest pain we will resume home medications  . Diabetes mellitus with peripheral vascular disease (HCC) - order to assess to sliding scale diet controlled  . Essential hypertension restart home medications  . Hypomagnesemia we will replace  . Protein calorie malnutrition (HCC) check prealbumin order nutritional consult Generalized weakness likely secondary to debility although may also have some dehydration contributing. Will rehydrate faint PT OT evaluation Left-sided weakness this is chronic unsure if it is any worsening patient is currently undergoing withdrawals-  given patient alcohol abuse and history of stroke   will obtain CT head   social work consult to clarify social situation  Prophylaxis: SCD  Until CT of the head was done  CODE STATUS:  FULL CODE  as per patient    Disposition: To home once workup is complete and patient is stable patient refuses placement  Other plan as per orders.  I have spent a total of 56 min on this admission  Amee Boothe 05/01/2015, 1:49 AM    [facility]  Pager [phone]   after 2 AM please page floor coverage PA If 7AM-7PM, please contact the day team taking care of the patient  [email or web address]  Password [email or web address]

## 2015-05-01 NOTE — ED Notes (Addendum)
Secretary paging a hospitalist so lactic with tachycardia can be reported

## 2015-05-01 NOTE — ED Notes (Signed)
Troponin of 0.87  Called to primary nurse

## 2015-05-01 NOTE — ED Notes (Signed)
ECHO US at bedside

## 2015-05-01 NOTE — Clinical Social Work Note (Signed)
Clinical Social Work Assessment  Patient Details  Name: Johnny [name] MRN: [id number] Date of Birth: March 26, 1935  Date of referral:  05/01/15               Reason for consult:  Abuse/Neglect (Patient denies abuse and neglect.)                Permission sought to share information with:   (Case Manger was present during some of asseessment.) Permission granted to share information::  No  Name::        Agency::     Relationship::     Contact Information:     Housing/Transportation Living arrangements for the past 2 months:  Single Family Home Source of Information:  Patient Patient Interpreter Needed:  None Criminal Activity/Legal Involvement Pertinent to Current Situation/Hospitalization:  No - Comment as needed Significant Relationships:    Lives with:  Adult Children Do you feel safe going back to the place where you live?  Yes Need for family participation in patient care:   (Patient states that his daughter is supportive.)  Care giving concerns:  There are no care giving concerns at this time. Patient states he is able to complete ADL's independently. Patient states that his daughter visits daily.   Social Worker assessment / plan:  CSW was consulted to speak with patient by nurse CM regarding possible neglect.  CSW met with patient at bedside. There was no family present. Patient informed CSW that he was drinking today with his friend. Also, he informed CSW that he drinks with his friends x2 a week.   Patient states he lives home alone in [place]. Patient informed CSW that he has a care giver which comes to his home x2 a week. He also states he receives meals on wheels x5 a week and that his daughter visits him x1 daily. Patient states that his daughter lives no more than 10 Minutes away from him.  Patient states that his daughter Johnny [name] is his primary support. Johnny [name]/ [phone]. Patient denies abuse and neglect from daughter and any other person. According to patient, he  can ambulate with a walker. CSW questioned patient about completing ADL's. Patient states that he needs some assistance but is able to put on his clothes and use the bathroom. Patient states that he has not been to the bathroom on some occassions due to being intoxicated.   CSW questioned patient about taking his prescriptions. Patient stated " You get to drinking that stuff and get worry minded". CSW asked patient to explain. Patient expressed to CSW that he gets confused when he drinks alcohol.   CSW provided supportive counseling to patient. CSW explained to patient the importance of self care and how it is vital that he take his prescribed medications. CSW made nurse aware.  Patient states that he does not have any questions at this time.   Employment status:  Retired Forensic scientist:   [employer].) PT Recommendations:  Not assessed at this time Information / Referral to community resources:   (Patient states that he is not interested in facility information. He states that he would like to return home  upon discahrge.)  Patient/Family's Response to care:  CSW spoke with patient about the importance of self care and hygiene. Patient is appropriate at this time.  Patient/Family's Understanding of and Emotional Response to Diagnosis, Current Treatment, and Prognosis:  Patient states that he has no questions for CSW at this time.  Emotional Assessment Appearance:  Appears stated  age Attitude/Demeanor/Rapport:   (Appropriate.) Affect (typically observed):  Accepting Orientation:  Oriented to Self, Oriented to Place, Oriented to  Time, Oriented to Situation Alcohol / Substance use:   (Patient informed CSW that he was drinking prior to coming to [facility]. He also states that he drinks x2 a week.) Psych involvement (Current and /or in the community):     Discharge Needs  Concerns to be addressed:  Adjustment to Illness Readmission within the last 30 days:  No Current discharge risk:   None Barriers to Discharge:  No Barriers Identified   Johnny Buffy, LCSW 05/01/2015, 10:37 PM

## 2015-05-01 NOTE — ED Notes (Signed)
POA pt. Daughter Seprina MoragGertie Exonne [phone]

## 2015-05-01 NOTE — ED Notes (Signed)
Patient transported to Ultrasound 

## 2015-05-01 NOTE — ED Notes (Signed)
Spoke with hospitalist. Awaiting new orders.

## 2015-05-01 NOTE — Progress Notes (Signed)
[facility] PROGRESS NOTE  Johnny [name] ZOX:[id number] DOB: 02-05-1935 DOA: 04/30/2015 PCP: Rogelia Boga, MD  Assessment/Plan: has a past medical history of Acute alcoholic hepatitis, Anemia of other chronic disease ,; Atrial fibrillation , alcohol abuse, seizure, who presents with weakness and fatigue. Patient was too weak to get up to use the bathroom and was found by EMS intervention urine and feces sitting on his wheelchair  1-Alcohol withdrawal  IV fluids, Start  CIWA protocol.   Atrial fibrillation; continue with metoprolol.   Lactic acidosis: chest x ray negative, UA no significant WBC.  CK level normal.  IV fluids. Cycle lactic acid.  Repeat chest x ray after hydration.  Check C diff. Will follow results to consider start antibiotics.   Mildly elevated troponin;  EKG tachycardia.  Cycle enzymes.  Check ECHO.   COPD (chronic obstructive pulmonary disease),  Xopenex as needed and Atrovent   Coronary atherosclerosis stable - will check lipid panel in the morning.  Diabetes mellitus with peripheral vascular disease (HCC) - order to assess to sliding scale diet controlled   Essential hypertension restart home medications   Hypomagnesemia ; replaced.   Protein calorie malnutrition (HCC) check prealbumin order nutritional consult  Generalized weakness likely secondary to debility although may also have some dehydration contributing. Will rehydrate faint PT OT evaluation  Left-sided weakness this is chronic unsure if it is any worsening patient is currently undergoing withdrawals- CT head negative. Will need PT. Might need MRI  social work consult to clarify social situation  Prophylaxis: SCD Until CT of the head was done Code Status: Full Code.  Family Communication: none at bedside. Care discussed with patient.  Disposition Plan: Remain inpatient.    Consultants:  none  Procedures:  ECHO  Antibiotics:    HPI/Subjective: Has productive  cough, report watery BM today.    Objective: Filed Vitals:   05/01/15 0730 05/01/15 0800  BP: 179/87 179/93  Pulse:  116  Temp:    Resp: 26 21   No intake or output data in the 24 hours ending 05/01/15 1005 There were no vitals filed for this visit.  Exam:   General:  Alert with tremors.   Cardiovascular: S 1, S 2 RRR tachycardic  Respiratory: CTA  Abdomen: BS present, soft, nt  Musculoskeletal: no edema.   Data Reviewed: Basic Metabolic Panel:  Recent Labs Lab 04/30/15 2206  NA 144  K 4.4  CL 109  CO2 24  GLUCOSE 167*  BUN 20  CREATININE 1.00  CALCIUM 9.1  MG 1.6*   Liver Function Tests:  Recent Labs Lab 04/30/15 2206  AST 34  ALT 24  ALKPHOS 113  BILITOT 0.9  PROT 7.9  ALBUMIN 3.9   No results for input(s): LIPASE, AMYLASE in the last 168 hours. No results for input(s): AMMONIA in the last 168 hours. CBC:  Recent Labs Lab 04/30/15 2206 05/01/15 0933  WBC 5.6 9.1  NEUTROABS 3.9  --   HGB 11.0* 11.0*  HCT 32.3* 32.8*  MCV 97.3 98.2  PLT 112* PENDING   Cardiac Enzymes:  Recent Labs Lab 04/30/15 2206  CKTOTAL 116   BNP (last 3 results)  Recent Labs  04/30/15 2206  BNP 39.5    ProBNP (last 3 results) No results for input(s): PROBNP in the last 8760 hours.  CBG:  Recent Labs Lab 05/01/15 0714  GLUCAP 96    No results found for this or any previous visit (from the past 240 hour(s)).   Studies: Dg Chest  2 View  04/30/2015  CLINICAL DATA:  Weakness. EXAM: CHEST  2 VIEW COMPARISON:  May 07, 2012. FINDINGS: The heart size and mediastinal contours are within normal limits. Both lungs are clear. No pneumothorax or pleural effusion is noted. Status post coronary artery bypass graft. The visualized skeletal structures are unremarkable. IMPRESSION: No active cardiopulmonary disease. Electronically Signed   By: Lupita RaiderJames  Green Jr, M.D.   On: 04/30/2015 22:54   Ct Head Wo Contrast  05/01/2015  CLINICAL DATA:  81 year old male with  history of seizures and alcohol abuse presenting with weakness. EXAM: CT HEAD WITHOUT CONTRAST TECHNIQUE: Contiguous axial images were obtained from the base of the skull through the vertex without intravenous contrast. COMPARISON:  CT dated 04/01/2012 FINDINGS: The ventricles are dilated and the sulci are prominent compatible with age-related atrophy. Periventricular and deep white matter hypodensities represent chronic microvascular ischemic changes. There is no intracranial hemorrhage. No mass effect or midline shift identified. The visualized paranasal sinuses and mastoid air cells are well aerated. The calvarium is intact. IMPRESSION: No acute intracranial hemorrhage. Age-related atrophy and chronic microvascular ischemic disease. If symptoms persist and there are no contraindications, MRI may provide better evaluation if clinically indicated. Electronically Signed   By: Elgie CollardArash  Radparvar M.D.   On: 05/01/2015 02:56    Scheduled Meds: . aspirin EC  81 mg Oral Daily  . folic acid  1 mg Oral Daily  . insulin aspart  0-5 Units Subcutaneous QHS  . insulin aspart  0-9 Units Subcutaneous TID WC  . metoprolol  75 mg Oral BID  . sodium chloride  3 mL Intravenous Q12H  . tamsulosin  0.4 mg Oral Daily  . thiamine  100 mg Oral Daily   Continuous Infusions: . sodium chloride      Active Problems:   Diabetes mellitus with peripheral vascular disease (HCC)   Essential hypertension   Coronary atherosclerosis   Atrial fibrillation (HCC)   Alcohol withdrawal (HCC)   Hypomagnesemia   COPD (chronic obstructive pulmonary disease), presumed   Protein calorie malnutrition (HCC)   Weakness    Time spent: 35 minutes.     Hartley Barefootegalado, Gaylia Kassel A  [facility] Pager [phone]. If 7PM-7AM, please contact night-coverage at [email or web address], password [email or web address] 05/01/2015, 10:05 AM  LOS: 0 days

## 2015-05-01 NOTE — Progress Notes (Signed)
VASCULAR LAB PRELIMINARY  PRELIMINARY  PRELIMINARY  PRELIMINARY  Bilateral lower extremity venous duplex completed.    Preliminary report:  Bilateral:  No evidence of DVT, superficial thrombosis, or Baker's Cyst.   Johnny [name], RVS 05/01/2015, 8:50 AM

## 2015-05-01 NOTE — ED Notes (Signed)
Patient placed on Hospital bed-ADLs performed-condom cath replaced

## 2015-05-01 NOTE — Progress Notes (Signed)
Echocardiogram 2D Echocardiogram with Definity has been performed.  Johnny [name], [name] 05/01/2015, 4:24 PM

## 2015-05-01 NOTE — Progress Notes (Addendum)
[facility] ED CM notified Clydie BraunKaren [facility] of pt admission for etoh withdrawal  Pt to be followed for any d/c needs

## 2015-05-01 NOTE — ED Notes (Signed)
Writer attempted to collect urine from condom catheter, no urine in the bag.

## 2015-05-02 ENCOUNTER — Encounter (HOSPITAL_COMMUNITY): Payer: Self-pay | Admitting: Physician Assistant

## 2015-05-02 DIAGNOSIS — I25810 Atherosclerosis of coronary artery bypass graft(s) without angina pectoris: Secondary | ICD-10-CM

## 2015-05-02 DIAGNOSIS — I10 Essential (primary) hypertension: Secondary | ICD-10-CM

## 2015-05-02 DIAGNOSIS — I426 Alcoholic cardiomyopathy: Secondary | ICD-10-CM

## 2015-05-02 DIAGNOSIS — R531 Weakness: Secondary | ICD-10-CM

## 2015-05-02 DIAGNOSIS — F10239 Alcohol dependence with withdrawal, unspecified: Principal | ICD-10-CM

## 2015-05-02 LAB — GLUCOSE, CAPILLARY
GLUCOSE-CAPILLARY: 132 mg/dL — AB (ref 65–99)
GLUCOSE-CAPILLARY: 144 mg/dL — AB (ref 65–99)
GLUCOSE-CAPILLARY: 181 mg/dL — AB (ref 65–99)
Glucose-Capillary: 150 mg/dL — ABNORMAL HIGH (ref 65–99)
Glucose-Capillary: 112 mg/dL — ABNORMAL HIGH (ref 65–99)
Glucose-Capillary: 190 mg/dL — ABNORMAL HIGH (ref 65–99)

## 2015-05-02 LAB — BASIC METABOLIC PANEL
ANION GAP: 8 (ref 5–15)
BUN: 11 mg/dL (ref 6–20)
CHLORIDE: 101 mmol/L (ref 101–111)
CO2: 26 mmol/L (ref 22–32)
Calcium: 8.6 mg/dL — ABNORMAL LOW (ref 8.9–10.3)
Creatinine, Ser: 0.68 mg/dL (ref 0.61–1.24)
Glucose, Bld: 129 mg/dL — ABNORMAL HIGH (ref 65–99)
POTASSIUM: 3.5 mmol/L (ref 3.5–5.1)
SODIUM: 135 mmol/L (ref 135–145)

## 2015-05-02 LAB — URINE CULTURE

## 2015-05-02 LAB — CBC
HCT: 34.4 % — ABNORMAL LOW (ref 39.0–52.0)
HEMOGLOBIN: 11.6 g/dL — AB (ref 13.0–17.0)
MCH: 33 pg (ref 26.0–34.0)
MCHC: 33.7 g/dL (ref 30.0–36.0)
MCV: 98 fL (ref 78.0–100.0)
PLATELETS: 96 10*3/uL — AB (ref 150–400)
RBC: 3.51 MIL/uL — AB (ref 4.22–5.81)
RDW: 14.1 % (ref 11.5–15.5)
WBC: 7 10*3/uL (ref 4.0–10.5)

## 2015-05-02 LAB — C DIFFICILE QUICK SCREEN W PCR REFLEX
C DIFFICILE (CDIFF) INTERP: NEGATIVE
C Diff antigen: NEGATIVE
C Diff toxin: NEGATIVE

## 2015-05-02 LAB — LIPID PANEL
CHOL/HDL RATIO: 2.4 ratio
CHOLESTEROL: 136 mg/dL (ref 0–200)
HDL: 56 mg/dL (ref >40)
LDL Cholesterol: 64 mg/dL (ref 0–99)
Triglycerides: 78 mg/dL (ref <150)
VLDL: 16 mg/dL (ref 0–40)

## 2015-05-02 LAB — HEMOGLOBIN A1C
HEMOGLOBIN A1C: 6.4 % — AB (ref 4.8–5.6)
MEAN PLASMA GLUCOSE: 137 mg/dL

## 2015-05-02 LAB — PHOSPHORUS: PHOSPHORUS: 2.2 mg/dL — AB (ref 2.5–4.6)

## 2015-05-02 MED ORDER — HYDRALAZINE HCL 20 MG/ML IJ SOLN
10.0000 mg | Freq: Once | INTRAMUSCULAR | Status: AC
  Administered 2015-05-02: 10 mg via INTRAVENOUS
  Filled 2015-05-02: qty 1

## 2015-05-02 MED ORDER — K PHOS MONO-SOD PHOS DI & MONO 155-852-130 MG PO TABS
500.0000 mg | ORAL_TABLET | Freq: Two times a day (BID) | ORAL | Status: AC
Start: 2015-05-02 — End: 2015-05-02
  Administered 2015-05-02 (×2): 500 mg via ORAL
  Filled 2015-05-02 (×3): qty 2

## 2015-05-02 MED ORDER — CETYLPYRIDINIUM CHLORIDE 0.05 % MT LIQD
7.0000 mL | Freq: Two times a day (BID) | OROMUCOSAL | Status: DC
  Administered 2015-05-02 – 2015-05-08 (×11): 7 mL via OROMUCOSAL

## 2015-05-02 MED ORDER — HYDRALAZINE HCL 25 MG PO TABS
25.0000 mg | ORAL_TABLET | Freq: Three times a day (TID) | ORAL | Status: DC
  Administered 2015-05-02 (×3): 25 mg via ORAL
  Filled 2015-05-02 (×3): qty 1

## 2015-05-02 MED ORDER — ENOXAPARIN SODIUM 40 MG/0.4ML ~~LOC~~ SOLN
40.0000 mg | SUBCUTANEOUS | Status: DC
  Administered 2015-05-02 – 2015-05-04 (×3): 40 mg via SUBCUTANEOUS
  Filled 2015-05-02 (×3): qty 0.4

## 2015-05-02 MED ORDER — ATORVASTATIN CALCIUM 10 MG PO TABS
10.0000 mg | ORAL_TABLET | Freq: Every day | ORAL | Status: DC
  Administered 2015-05-02 – 2015-05-03 (×2): 10 mg via ORAL
  Filled 2015-05-02 (×4): qty 1

## 2015-05-02 MED ORDER — LISINOPRIL 20 MG PO TABS
20.0000 mg | ORAL_TABLET | Freq: Every day | ORAL | Status: DC
  Administered 2015-05-02 – 2015-05-03 (×2): 20 mg via ORAL
  Filled 2015-05-02 (×3): qty 2
  Filled 2015-05-02: qty 1

## 2015-05-02 MED ORDER — ENSURE ENLIVE PO LIQD
237.0000 mL | Freq: Two times a day (BID) | ORAL | Status: DC
  Administered 2015-05-02 – 2015-05-08 (×4): 237 mL via ORAL

## 2015-05-02 NOTE — Progress Notes (Signed)
Urine output has decreased.  MD was made aware.  A bladder scan was performed to ensure there was no retention.  Bladder scan results showed 0 mL.   Will encourage patient to drink more fluids to stay properly hydrated.   Will continue to monitor.

## 2015-05-02 NOTE — Care Management Note (Signed)
Case Management Note  Patient Details  Name: Johnny [name] MRN: [id number] Date of Birth: [birth year]  Subjective/Objective:                 etoh w/d versus tia   Action/Plan:Date: May 02, 2015 Chart reviewed for concurrent status and case management needs. Will continue to follow patient for changes and needs: Marcelle Smilinghonda Davis, RN, BSN, [place]   [phone]   Expected Discharge Date:   (unknown)               Expected Discharge Plan:     In-House Referral:     Discharge planning Services     Post Acute Care Choice:    Choice offered to:     DME Arranged:    DME Agency:     HH Arranged:    HH Agency:     Status of Service:     Medicare Important Message Given:    Date Medicare IM Given:    Medicare IM give by:    Date Additional Medicare IM Given:    Additional Medicare Important Message give by:     If discussed at Long Length of Stay Meetings, dates discussed:    Additional Comments:  Golda AcreDavis, Rhonda Lynn, RN 05/02/2015, 10:07 AM

## 2015-05-02 NOTE — Progress Notes (Signed)
Initial Nutrition Assessment  DOCUMENTATION CODES:   Not applicable  INTERVENTION:  -Ensure Enlive po BID, each supplement provides 350 kcal and 20 grams of protein -RD will continue to monitor for needs  NUTRITION DIAGNOSIS:   Inadequate oral intake related to poor appetite as evidenced by per patient/family report.  GOAL:   Patient will meet greater than or equal to 90% of their needs  MONITOR:   PO intake, Labs, I & O's, Supplement acceptance  REASON FOR ASSESSMENT:   Consult Assessment of nutrition requirement/status  ASSESSMENT:   Presented with weakness and fatigue which she attributes to alcohol abuse. Patient was too weak to get up to use the bathroom and was found by EMS intervention urine and feces sitting on his wheelchair. Reports usually able to walk but has not since 2 days ago. She states he started to drink again and stopped taking any of his medications. He is usually on metoprolol. Patient lives alone and does not want any placement. Has history of diabetes it's mainly diet-controlled. Patient has known history of alcohol abuse. Has history of paroxysmal atrial fibrillation not on anticoagulation candidate secondary to alcohol abuse.  Spoke with pt at bedside. Pt reports poor po intake related to weakness, and alcohol abuse. He endorses no chewing or swallowing problems; no nausea/vomiting/diarrhea/constipation.   Nutrition-Focused physical exam completed. Findings are moderate fat depletion, mild muscle depletion, and no edema.   Pt stated his usual body weight is about 190# - He is currently 174# Per chart, he exhibits a 7#/4% insignificant wt loss in 11 months. He has not been 190#, per chart, since 4/15; 188# on 8/15.   Labs and Medications reviewed.  Diet Order:  Diet Carb Modified Fluid consistency:: Thin; Room service appropriate?: Yes  Skin:  Reviewed, no issues  Last BM:  1/10  Height:   Ht Readings from Last 1 Encounters:  05/01/15 5\' 11"   (1.803 m)    Weight:   Wt Readings from Last 1 Encounters:  05/02/15 174 lb 6.1 oz (79.1 kg)    Ideal Body Weight:  78.18 kg  BMI:  Body mass index is 24.33 kg/(m^2).  Estimated Nutritional Needs:   Kcal:  1950-2350  Protein:  80-90 grams  Fluid:  >/= 2L  EDUCATION NEEDS:   No education needs identified at this time  Dionne AnoWilliam M. Tirso Laws, MS, RD LDN After Hours/Weekend Pager [phone]

## 2015-05-02 NOTE — Consult Note (Signed)
Cardiology Consultation Note    Patient ID: Johnny [name], MRN: [id number], DOB/AGE: 1935/01/12 81 y.o. Admit date: 04/30/2015   Date of Consult: 05/02/2015 Primary Physician: Rogelia Boga, MD Primary Cardiologist: remotely seen by Dr. Gala Romney and Dr. Graciela Husbands about 10 years ago  Chief Complaint: weakness Reason for Consultation: diminished EF, elevated troponin Requesting MD: Dr. 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   He presented to [facility] the evening of 04/29/14 with weakness and fatigue. He was too weak to get up to use the bathroom and has been unable to walk for 2 days. He was found by EMS in urine/feces in his wheelchair. He has been admitted for alcohol withdrawal. Labs notable for troponin 0.87->0.63->0.42, initial lactate 3.9, Hgb 11.6, Plt 96 (new), A1C 6.4, TSH WNL. CXR NAD. CT head no acute intracranial hemorrhage; chronic microvascular disease. He was noted to be tachycardic in the ER with HR 120s (sinus tach), deconditioned, unable to standup on his own, and tremulous. He has apparently not been on anticoagulation due to overall comorbidities and bleeding risk. He has not been seen by cardiology in about 10 years. He reports chronic unchanged DOE. He denies any chest pain/pressure. He denies any palpitations or syncope. He says he drinks about 40 oz of wine or other alcohol daily. When asked if interested in quitting or rehab he states "this is it this time."  2D  echo 05/01/15 showed EF 40% (difficult study), anteroseptal HK, inferior/inflat HK, grade 1 DD, mild MR (prior EF in 2013 50-55% with poor quality echo, but in 2011 he also had EF 40-45%). Metoprolol was resumed. IM has started hydralazine and lisinopril today due to high blood pressure/cardiomyopathy.  Past Medical History  Diagnosis Date  . Acute alcoholic hepatitis 05/20/2010  . Anemia of other chronic disease 08/16/2007  . CAD (coronary artery disease)     a. s/p CABG, notes unclear - 1998 or 2003?  Marland Kitchen Essential hypertension   . Hyperlipidemia   . ETOH abuse   . Chronic low back pain   . Pneumonia     "now; never before" (04/01/2012)  . Hallucination 04/01/2012  . Arthritis   . Altered mental status 04/01/2012  . Seizures (HCC) 04/01/2012  . Diabetes mellitus (HCC)   . History of stroke   . Paroxysmal atrial fibrillation (HCC)   . Paroxysmal atrial flutter (HCC)     a. s/p ablation 2007.  . Coagulopathy (HCC)   . Protein calorie malnutrition (HCC)   . PEA (Pulseless electrical activity) (HCC)     a. h/o PEA cardiac arrest 04/2012 (felt r/t PNA, acidosis, hypoxic resp failure in setting of alcohol withdrawal; required tracheostomy)      Surgical History:  Past Surgical History  Procedure Laterality Date  . Coronary artery bypass graft  ~ 2003    CABG X4  . Cataract extraction w/ intraocular lens  implant, bilateral      "years ago" (04/01/2012)  . Esophagogastroduodenoscopy  04/13/2012    Procedure: ESOPHAGOGASTRODUODENOSCOPY (EGD);  Surgeon: Florencia Reasons, MD;  Location: [facility] ENDOSCOPY;  Service: Endoscopy;  Laterality: N/A;  push peg  . Peg placement  04/13/2012    Procedure: PERCUTANEOUS ENDOSCOPIC GASTROSTOMY (PEG) PLACEMENT;  Surgeon: Florencia Reasons, MD;  Location: [facility] ENDOSCOPY;  Service: Endoscopy;  Laterality: N/A;     Home Meds: Prior to Admission medications   Medication Sig Start Date End Date Taking? Authorizing Provider  ACCU-CHEK SOFTCLIX LANCETS lancets 1  each by Other route daily as needed for other. 06/20/13  Yes Gordy Savers, MD  aspirin 81 MG tablet Take 81 mg by mouth daily.   Yes Historical Provider, MD  glucose blood (ACCU-CHEK AVIVA PLUS) test strip 1 each by Other route daily as needed for other. 06/20/13  Yes Gordy Savers, MD  metoprolol (LOPRESSOR) 50 MG tablet TAKE 1.5 TABLETS (75 MG TOTAL) BY MOUTH 2 (TWO) TIMES DAILY. 04/19/15  Yes Gordy Savers, MD  Multiple Vitamin (MULTIVITAMIN) tablet Take 1 tablet by mouth daily.   Yes Historical Provider, MD  tamsulosin (FLOMAX) 0.4 MG CAPS capsule TAKE 1 CAPSULE (0.4 MG TOTAL) BY MOUTH DAILY. 12/26/14  Yes Gordy Savers, MD  atorvastatin (LIPITOR) 20 MG tablet TAKE 1 TABLET (20 MG TOTAL) BY MOUTH DAILY. Patient not taking: Reported on 05/01/2015 11/24/14   Gordy Savers, MD    Inpatient Medications:  . antiseptic oral rinse  7 mL Mouth Rinse BID  . aspirin EC  81 mg Oral Daily  . atorvastatin  10 mg Oral q1800  . enoxaparin (LOVENOX) injection  40 mg Subcutaneous Q24H  . folic acid  1 mg Oral Daily  . hydrALAZINE  25 mg Oral 3 times per day  . insulin aspart  0-5 Units Subcutaneous QHS  . insulin aspart  0-9 Units Subcutaneous TID WC  . lisinopril  20 mg Oral Daily  . metoprolol  75 mg Oral BID  . phosphorus  500 mg Oral BID  . sodium chloride  3 mL Intravenous Q12H  . tamsulosin  0.4 mg Oral Daily  . thiamine  100 mg Oral Daily      Allergies: No Known Allergies  Social History   Social History  . Marital Status: Married    Spouse Name: N/A  . Number of Children: N/A  . Years of Education: N/A   Occupational History  . Not on file.   Social History Main Topics  . Smoking status: Former Smoker -- 0.50 packs/day for 17 years    Types: Cigarettes    Quit date: 04/22/1983  . Smokeless tobacco: Never Used     Comment: 04/01/2012 "quit smoking 20+ yr ago"  . Alcohol Use: 10.8 oz/week    2 Glasses of wine, 16 Shots of liquor per week     Comment:  hx of ETOH abuse; 04/01/2012 "drink ~ 1 pint//wk; vodka; cup of wine/wk" 04/2015 40 oz wine per day  . Drug Use: No  . Sexual Activity: No   Other Topics Concern  . Not on file   Social History Narrative     Family History  Problem Relation Age of Onset  . Heart failure Mother   . Aneurysm Sister   . Suicidality Brother   . Heart failure Brother   . Diabetes Sister      Review of Systems: All other systems reviewed and are otherwise negative except as noted above.  Labs:  Recent Labs  04/30/15 2206 05/01/15 0933 05/01/15 1401 05/01/15 2030  CKTOTAL 116  --   --   --   TROPONINI  --  0.87* 0.63*  0.42*   Lab Results  Component Value Date   WBC 7.0 05/02/2015   HGB 11.6* 05/02/2015   HCT 34.4* 05/02/2015   MCV 98.0 05/02/2015   PLT 96* 05/02/2015    Recent Labs Lab 05/01/15 0933 05/02/15 0645  NA 143 135  K 3.8 3.5  CL 107 101  CO2 21* 26  BUN 13 11  CREATININE 0.84 0.68  CALCIUM 8.9 8.6*  PROT 7.6  --   BILITOT 1.7*  --   ALKPHOS 105  --   ALT 23  --   AST 38  --   GLUCOSE 138* 129*   Lab Results  Component Value Date   CHOL 136 05/02/2015   HDL 56 05/02/2015   LDLCALC 64 05/02/2015   TRIG 78 05/02/2015   Lab Results  Component Value Date   DDIMER 1.66* 04/29/2011    Radiology/Studies:  Dg Chest 2 View  04/30/2015  CLINICAL DATA:  Weakness. EXAM: CHEST  2 VIEW COMPARISON:  May 07, 2012. FINDINGS: The heart size and mediastinal contours are within normal limits. Both lungs are clear. No pneumothorax or pleural effusion is noted. Status post coronary artery bypass graft. The visualized skeletal structures are unremarkable. IMPRESSION: No active cardiopulmonary disease. Electronically Signed   By: Lupita RaiderJames  Green Jr, M.D.   On: 04/30/2015 22:54   Ct Head Wo Contrast  05/01/2015  CLINICAL DATA:  81 year old male with history of seizures and alcohol abuse presenting with weakness. EXAM: CT HEAD WITHOUT CONTRAST TECHNIQUE: Contiguous axial images  were obtained from the base of the skull through the vertex without intravenous contrast. COMPARISON:  CT dated 04/01/2012 FINDINGS: The ventricles are dilated and the sulci are prominent compatible with age-related atrophy. Periventricular and deep white matter hypodensities represent chronic microvascular ischemic changes. There is no intracranial hemorrhage. No mass effect or midline shift identified. The visualized paranasal sinuses and mastoid air cells are well aerated. The calvarium is intact. IMPRESSION: No acute intracranial hemorrhage. Age-related atrophy and chronic microvascular ischemic disease. If symptoms persist and there are no contraindications, MRI may provide better evaluation if clinically indicated. Electronically Signed   By: Elgie CollardArash  Radparvar M.D.   On: 05/01/2015 02:56   Dg Chest Port 1 View  05/01/2015  CLINICAL DATA:  Cough.  Worsening weakness, fatigue. EXAM: PORTABLE CHEST 1 VIEW COMPARISON:  04/30/2015 FINDINGS: Prior CABG. Heart is normal size. Lungs are clear. No effusions. No acute bony abnormality. IMPRESSION: No active disease. Electronically Signed   By: Charlett NoseKevin  Dover M.D.   On: 05/01/2015 11:20    Wt Readings from Last 3 Encounters:  05/02/15 174 lb 6.1 oz (79.1 kg)  03/23/15 179 lb (81.194 kg)  12/08/14 179 lb (81.194 kg)   EKG:  1/9: sinus tach 123bpm, RBBB, occasional PVCs 1/10: sinus tach 113bpm, RBBB 1/10: sinus tach 118bpm, RBBB ?LAFB, occasional PVCs  Physical Exam: Blood pressure 190/51, pulse 91, temperature 97.2 F (36.2 C), temperature source Axillary, resp. rate 23, height 5\' 11"  (1.803 m), weight 174 lb 6.1 oz (79.1 kg), SpO2 100 %. Body mass index is 24.33 kg/(m^2). General: Chronically ill appearing Johnny [name] in no acute distress. Head: Normocephalic, atraumatic, sclera non-icteric, no xanthomas, nares are without discharge.  Neck: Negative for carotid bruits. JVD not elevated. Lungs: Clear bilaterally to auscultation without wheezes, rales, or  rhonchi. Breathing is unlabored. Heart: RRR with S1 S2. No murmurs, rubs, or gallops appreciated. Abdomen: Soft, non-tender, non-distended with normoactive bowel sounds. No hepatomegaly. No rebound/guarding. No obvious abdominal masses. Msk:  Strength  and tone appear normal for age. Extremities: No clubbing or cyanosis. No edema.  Distal pedal pulses are 2+ and equal bilaterally. Neuro: Sleeping when I first walked in but easily arousable. Alert and oriented X 3. No facial asymmetry. Tremor in right arm noted. Psych:  Responds to questions appropriately with a normal affect.     Assessment and Plan   1. Alcohol withdrawal c/b weakness, tremor, lactic acidosis - per IM.   2. Recurrent cardiomyopathy - LV function has fluctuated on prior echoes, back down to 40%. 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 Cannot fully exclude ischemic cardiomyopathy. Agree with starting lisinopril and hydralazine today. If he tolerates this addition, would add nitrates tomorrow. I would continue metoprolol for now given history of remote AF/AFL but if BP remains challenging to control, would consider changing to carvedilol.  3. Elevated troponin in the setting of known remote CAD s/p CABG - suspect demand ischemia. No recent anginal symptoms. Would not rush to cath in the setting of ongoing alcohol abuse, h/o noncompliance and CBC abnormalities. Suspect it is appropriate to see him back in the office first to determine if updated stress testing is appropriate. Continue ASA, BB.   4. H/o paroxysmal atrial fib/atrial flutter - He has remained in sinus tach/sinus rhythm this admission. He has not been a candidate for anticoagulation due to bleeding risk with ongoing alcohol abuse. Note new thrombocytopenia this admission as well. Continue to observe.  5. Chronic anemia with new thrombocytopenia (?r/t history of alcohol abuse and coagulopathy) - per IM.  Signed, Laurann Montana PA-C 05/02/2015, 9:47 AM Pager:  [phone]  Patient seen and examined and history reviewed. Agree with above findings and plan. 81 yo BM with remote history of CABG. Admitted for alcohol withdrawal, tremors, weakness and lactic acidosis. No active cardiac complaints. No gallop or murmur. Lungs clear. He is in NSR. Echo shows new LV dysfunction with EF 40%. Study was noted to be technically difficult even with contrast. Appropriate therapy at this time is Etoh cessation and optimizing medical therapy for CHF as noted. Troponin elevation is consistent with acute medical illness and not ACS. Would recommend follow up later as outpatient and if he is compliant with medical therapy repeating an Echo in 3 months and consider an outpatient nuclear stress test.   Graclyn Lawther [place], MDFACC 05/02/2015 2:34 PM

## 2015-05-02 NOTE — Evaluation (Addendum)
Occupational Therapy Evaluation Patient Details Name: Johnny [name] MRN: [id number] DOB: May 22, 1934 Today's Date: 05/02/2015    History of Present Illness pt was admitted with weakness; ETOH withdrawal.  PMH significant for DM, HTN, CAD, CABG, CVA with L weakness   Clinical Impression   This 81 year old man was admitted for the above.  He needs +2 assistance for sit to stand (but not standing erectly) for adls.  Unable to safely perform transfer at this time. Pt will benefit from continued OT to increase safety and independence with these tasks.  Goals in acute are for set up for UB adls and mod A for standing for ADLs, mod +2 for SPT to commode    Follow Up Recommendations  SNF    Equipment Recommendations   (possibly 3:1 commode, defer to next venue)    Recommendations for Other Services       Precautions / Restrictions Precautions Precautions: Fall Restrictions Weight Bearing Restrictions: No      Mobility Bed Mobility Overal bed mobility: Needs Assistance;+2 for physical assistance;+ 2 for safety/equipment Bed Mobility: Supine to Sit;Sit to Supine     Supine to sit: Mod assist;+2 for physical assistance;+2 for safety/equipment Sit to supine: Mod assist;+2 for physical assistance;+2 for safety/equipment   General bed mobility comments: assist for trunk and legs  Transfers Overall transfer level: Needs assistance Equipment used: Rolling walker (2 wheeled) Transfers: Sit to/from Stand Sit to Stand: Max assist;+2 physical assistance         General transfer comment: could not stand erect    Balance                                            ADL Overall ADL's : Needs assistance/impaired Eating/Feeding:  (not tested.  Pt with bil tremors)   Grooming: Min guard;Wash/dry face;Bed level   Upper Body Bathing: Minimal assitance;Bed level   Lower Body Bathing: Maximal assistance;+2 for physical assistance;Sit to/from stand (semi stand)   Upper  Body Dressing : Minimal assistance;Bed level   Lower Body Dressing: Total assistance;+2 for physical assistance;Sit to/from stand (semi stand)                 General ADL Comments: partially stood with max +2 assist.  Pt lifted buttocks from bed but could not stand erect.  Pt has tremors.  Needed guarding to protect face when washing it.  He has slip on shoes with velcro straps.  He was unable successfully step into these and could not reach to secure straps.  HR 100-133     Vision     Perception     Praxis      Pertinent Vitals/Pain Pain Assessment: No/denies pain     Hand Dominance Right   Extremity/Trunk Assessment Upper Extremity Assessment Upper Extremity Assessment: Generalized weakness           Communication Communication Communication: No difficulties   Cognition Arousal/Alertness: Awake/alert Behavior During Therapy: WFL for tasks assessed/performed Overall Cognitive Status: No family/caregiver present to determine baseline cognitive functioning                 General Comments: pt not oriented to time; slow processing, extra time, decreased awareness of deficits   General Comments       Exercises       Shoulder Instructions      Home Living Family/patient expects to  be discharged to:: Unsure--anticipate he will need snf Living Arrangements: Alone                                      Prior Functioning/Environment Level of Independence: Independent        Comments: pt reports only one fall in last couple of years    OT Diagnosis: Generalized weakness   OT Problem List: Decreased strength;Decreased activity tolerance;Impaired balance (sitting and/or standing);Decreased coordination;Decreased cognition;Decreased knowledge of use of DME or AE   OT Treatment/Interventions: Self-care/ADL training;DME and/or AE instruction;Patient/family education;Therapeutic activities;Cognitive remediation/compensation;Balance training     OT Goals(Current goals can be found in the care plan section) Acute Rehab OT Goals Patient Stated Goal: walk and get home OT Goal Formulation: With patient Time For Goal Achievement: 05/16/15 Potential to Achieve Goals: Fair ADL Goals Pt Will Perform Grooming: with set-up;sitting Pt Will Transfer to Toilet: with mod assist;with +2 assist;bedside commode;stand pivot transfer Additional ADL Goal #1: Pt will perform UB adls with set up sitting Additional ADL Goal #2: pt will go from sit to stand with mod A and maintain with min guard for 2 minutes for adls  OT Frequency: Min 2X/week   Barriers to D/C:            Co-evaluation PT/OT/SLP Co-Evaluation/Treatment: Yes Reason for Co-Treatment: For patient/therapist safety PT goals addressed during session: Mobility/safety with mobility OT goals addressed during session: ADL's and self-care      End of Session    Activity Tolerance: Patient limited by fatigue Patient left: in bed;with call bell/phone within reach;with bed alarm set   Time: 1610-9604 OT Time Calculation (min): 25 min Charges:  OT General Charges $OT Visit: 1 Procedure OT Evaluation $OT Eval Moderate Complexity: 1 Procedure G-Codes:    Johnny [name] 05-May-2015, 3:53 PM  Marica Otter, OTR/L [phone] 2015-05-05

## 2015-05-02 NOTE — Progress Notes (Signed)
[facility] PROGRESS NOTE  Johnny [name] KGM:[id number] DOB: 1934/07/22 DOA: 04/30/2015 PCP: Rogelia Boga, MD  Assessment/Plan: 81 year old with  past medical history of Acute alcoholic hepatitis, Anemia of other chronic disease ,; Atrial fibrillation , alcohol abuse, seizure, who presents with weakness and fatigue. Patient was too weak to get up to use the bathroom and was found by EMS intervention urine and feces sitting on his wheelchair.   1-Alcohol withdrawal   Continue with CIWA protocol.  Continue with thiamine and folic acid.  CIWA at 6 this am.   Atrial fibrillation; continue with metoprolol. Cardiology consulted.   Lactic acidosis: chest x ray negative, UA no significant WBC.  CK level normal.  Lactic acid has decrease from 4 to 2.2.  Repeated chest x ray after hydration was negative.  C diff. Will follow results to consider start antibiotics.   Mildly elevated troponin;  EKG tachycardia.  Mildly elevated troponin/  ECHO with further decreases EF now at 40% , possible hypokinesis. Cardiomyopathy might be related to alcohol abuse, but will consult cardiology for further evaluation.   Acute Systolic Heart failure;  New decrease EF at 40 %.  Continue with metoprolol.  Will start lisinopril, monitor renal function.   COPD (chronic obstructive pulmonary disease),  Xopenex as needed and Atrovent   Coronary atherosclerosis stable - lipid panel pending. Resume statins. Continue with aspirin.   Diabetes mellitus with peripheral vascular disease (HCC) - order to assess to sliding scale diet controlled   Essential hypertension ; continue with metoprolol. Started lisinopril and hydralazine  Hypomagnesemia ; replaced. Hypophosphatemia; replaced.   Protein calorie malnutrition (HCC) check prealbumin order nutritional consult  Generalized weakness likely secondary to debility although may also have some dehydration contributing. Will rehydrate faint PT OT  evaluation  Left-sided weakness this is chronic unsure if it is any worsening patient is currently undergoing withdrawals- CT head negative. Will need PT. He feels left side weakness is table.   social work consult to clarify social situation  Prophylaxis: SCD, lovenox Code Status: Full Code.  Family Communication: none at bedside. Care discussed with patient.  Disposition Plan: Remain inpatient. Treatment for alcohol withdrawal, new diagnosis cardiomyopathy, cardiology evaluation.    Consultants:  cardiology  Procedures:  ECHO; Ef 40 % new  Antibiotics:  none  HPI/Subjective: No further diarrhea.    Objective: Filed Vitals:   05/02/15 0501 05/02/15 0600  BP: 165/69 162/70  Pulse:  108  Temp:    Resp:  22    Intake/Output Summary (Last 24 hours) at 05/02/15 0739 Last data filed at 05/02/15 0500  Gross per 24 hour  Intake 1206.67 ml  Output   1550 ml  Net -343.33 ml   Filed Weights   05/01/15 1537 05/02/15 0400  Weight: 80.1 kg (176 lb 9.4 oz) 79.1 kg (174 lb 6.1 oz)    Exam:   General:  Alert with mild tremors hands.   Cardiovascular: S 1, S 2 RRR tachycardic  Respiratory: CTA  Abdomen: BS present, soft, nt  Musculoskeletal: no edema.   Data Reviewed: Basic Metabolic Panel:  Recent Labs Lab 04/30/15 2206 05/01/15 0933 05/02/15 0645  NA 144 143  --   K 4.4 3.8  --   CL 109 107  --   CO2 24 21*  --   GLUCOSE 167* 138*  --   BUN 20 13  --   CREATININE 1.00 0.84  --   CALCIUM 9.1 8.9  --   MG 1.6* 1.7  --  PHOS  --  2.1* 2.2*   Liver Function Tests:  Recent Labs Lab 04/30/15 2206 05/01/15 0933  AST 34 38  ALT 24 23  ALKPHOS 113 105  BILITOT 0.9 1.7*  PROT 7.9 7.6  ALBUMIN 3.9 3.7   No results for input(s): LIPASE, AMYLASE in the last 168 hours. No results for input(s): AMMONIA in the last 168 hours. CBC:  Recent Labs Lab 04/30/15 2206 05/01/15 0933  WBC 5.6 9.1  NEUTROABS 3.9  --   HGB 11.0* 11.0*  HCT 32.3*  32.8*  MCV 97.3 98.2  PLT 112* 102*   Cardiac Enzymes:  Recent Labs Lab 04/30/15 2206 05/01/15 0933 05/01/15 1401 05/01/15 2030  CKTOTAL 116  --   --   --   TROPONINI  --  0.87* 0.63* 0.42*   BNP (last 3 results)  Recent Labs  04/30/15 2206  BNP 39.5    ProBNP (last 3 results) No results for input(s): PROBNP in the last 8760 hours.  CBG:  Recent Labs Lab 05/01/15 0714 05/01/15 1142 05/01/15 2156  GLUCAP 96 147* 138*    Recent Results (from the past 240 hour(s))  MRSA PCR Screening     Status: None   Collection Time: 05/01/15  4:07 PM  Result Value Ref Range Status   MRSA by PCR NEGATIVE NEGATIVE Final    Comment:        The GeneXpert MRSA Assay (FDA approved for NASAL specimens only), is one component of a comprehensive MRSA colonization surveillance program. It is not intended to diagnose MRSA infection nor to guide or monitor treatment for MRSA infections.      Studies: Dg Chest 2 View  04/30/2015  CLINICAL DATA:  Weakness. EXAM: CHEST  2 VIEW COMPARISON:  May 07, 2012. FINDINGS: The heart size and mediastinal contours are within normal limits. Both lungs are clear. No pneumothorax or pleural effusion is noted. Status post coronary artery bypass graft. The visualized skeletal structures are unremarkable. IMPRESSION: No active cardiopulmonary disease. Electronically Signed   By: Lupita Raider, M.D.   On: 04/30/2015 22:54   Ct Head Wo Contrast  05/01/2015  CLINICAL DATA:  81 year old male with history of seizures and alcohol abuse presenting with weakness. EXAM: CT HEAD WITHOUT CONTRAST TECHNIQUE: Contiguous axial images were obtained from the base of the skull through the vertex without intravenous contrast. COMPARISON:  CT dated 04/01/2012 FINDINGS: The ventricles are dilated and the sulci are prominent compatible with age-related atrophy. Periventricular and deep white matter hypodensities represent chronic microvascular ischemic changes. There is no  intracranial hemorrhage. No mass effect or midline shift identified. The visualized paranasal sinuses and mastoid air cells are well aerated. The calvarium is intact. IMPRESSION: No acute intracranial hemorrhage. Age-related atrophy and chronic microvascular ischemic disease. If symptoms persist and there are no contraindications, MRI may provide better evaluation if clinically indicated. Electronically Signed   By: Elgie Collard M.D.   On: 05/01/2015 02:56   Dg Chest Port 1 View  05/01/2015  CLINICAL DATA:  Cough.  Worsening weakness, fatigue. EXAM: PORTABLE CHEST 1 VIEW COMPARISON:  04/30/2015 FINDINGS: Prior CABG. Heart is normal size. Lungs are clear. No effusions. No acute bony abnormality. IMPRESSION: No active disease. Electronically Signed   By: Charlett Nose M.D.   On: 05/01/2015 11:20    Scheduled Meds: . antiseptic oral rinse  7 mL Mouth Rinse BID  . aspirin EC  81 mg Oral Daily  . atorvastatin  10 mg Oral q1800  . enoxaparin (  LOVENOX) injection  40 mg Subcutaneous Q24H  . folic acid  1 mg Oral Daily  . insulin aspart  0-5 Units Subcutaneous QHS  . insulin aspart  0-9 Units Subcutaneous TID WC  . lisinopril  20 mg Oral Daily  . metoprolol  75 mg Oral BID  . phosphorus  500 mg Oral BID  . potassium phosphate (monobasic)  500 mg Oral BID WC  . sodium chloride  3 mL Intravenous Q12H  . tamsulosin  0.4 mg Oral Daily  . thiamine  100 mg Oral Daily   Continuous Infusions:    Active Problems:   Diabetes mellitus with peripheral vascular disease (HCC)   Essential hypertension   Coronary atherosclerosis   Atrial fibrillation (HCC)   Alcohol withdrawal (HCC)   Hypomagnesemia   COPD (chronic obstructive pulmonary disease), presumed   Protein calorie malnutrition (HCC)   Weakness   Altered mental status    Time spent: 35 minutes.     Hartley Barefootegalado, Kaleen Rochette A  [facility] Pager [phone]. If 7PM-7AM, please contact night-coverage at [email or web address], password  [email or web address] 05/02/2015, 7:39 AM  LOS: 1 day

## 2015-05-02 NOTE — Evaluation (Signed)
Physical Therapy Evaluation Patient Details Name: Johnny [name] MRN: [id number] DOB: 1934/06/04 Today's Date: 05/02/2015   History of Present Illness  pt was admitted with weakness; ETOH withdrawal.  PMH significant for DM, HTN, CAD, CABG, CVA with L weakness  Clinical Impression  Patient presents with  Weakness for standing at the RW, unable to ambulate. Patient will benefit from PT to address problems listed in the note below.    Follow Up Recommendations SNF;Supervision/Assistance - 24 hour    Equipment Recommendations  None recommended by PT    Recommendations for Other Services       Precautions / Restrictions Precautions Precautions: Fall Precaution Comments: incontinence, needs diabetic shoes Restrictions Weight Bearing Restrictions: No      Mobility  Bed Mobility Overal bed mobility: Needs Assistance;+2 for physical assistance;+ 2 for safety/equipment Bed Mobility: Supine to Sit;Sit to Supine     Supine to sit: Mod assist;+2 for physical assistance;+2 for safety/equipment Sit to supine: Mod assist;+2 for physical assistance;+2 for safety/equipment   General bed mobility comments: assist for trunk and legs  Transfers Overall transfer level: Needs assistance Equipment used: Rolling walker (2 wheeled) Transfers: Sit to/from Stand Sit to Stand: Max assist;+2 physical assistance         General transfer comment: could not stand erect  Ambulation/Gait                Stairs            Wheelchair Mobility    Modified Rankin (Stroke Patients Only)       Balance Overall balance assessment: History of Falls;Needs assistance Sitting-balance support: Bilateral upper extremity supported;Feet supported Sitting balance-Leahy Scale: Fair Sitting balance - Comments: could reach towards his feet.                                     Pertinent Vitals/Pain Pain Assessment: No/denies pain    Home Living Family/patient expects to be  discharged to:: Unsure Living Arrangements: Alone;Other (Comment)               Additional Comments: has an aide 2 x week    Prior Function Level of Independence: Independent with assistive device(s)   Gait / Transfers Assistance Needed: patient reports that he can walk a short distance, uses his WC mostly,      Comments: pt reports only one fall in last couple of years     Hand Dominance   Dominant Hand: Right    Extremity/Trunk Assessment   Upper Extremity Assessment: Defer to OT evaluation           Lower Extremity Assessment: Generalized weakness      Cervical / Trunk Assessment: Normal  Communication   Communication: No difficulties  Cognition Arousal/Alertness: Awake/alert Behavior During Therapy: WFL for tasks assessed/performed Overall Cognitive Status: No family/caregiver present to determine baseline cognitive functioning Area of Impairment: Orientation Orientation Level: Time             General Comments: pt not oriented to time; slow processing, extra time, decreased awareness of deficits    General Comments      Exercises        Assessment/Plan    PT Assessment Patient needs continued PT services  PT Diagnosis Difficulty walking;Generalized weakness;Altered mental status   PT Problem List Decreased strength;Decreased activity tolerance;Decreased balance;Decreased mobility;Decreased cognition;Decreased knowledge of precautions;Decreased safety awareness;Decreased knowledge of use of DME  PT Treatment  Interventions DME instruction;Gait training;Functional mobility training;Therapeutic activities;Therapeutic exercise;Patient/family education   PT Goals (Current goals can be found in the Care Plan section) Acute Rehab PT Goals Patient Stated Goal: walk and get home PT Goal Formulation: With patient Time For Goal Achievement: 05/16/15 Potential to Achieve Goals: Fair    Frequency Min 3X/week   Barriers to discharge Decreased  caregiver support      Co-evaluation PT/OT/SLP Co-Evaluation/Treatment: Yes Reason for Co-Treatment: For patient/therapist safety PT goals addressed during session: Mobility/safety with mobility OT goals addressed during session: ADL's and self-care       End of Session Equipment Utilized During Treatment: Gait belt Activity Tolerance: Patient limited by fatigue Patient left: in bed;with call bell/phone within reach;with bed alarm set Nurse Communication: Mobility status         Time: [id number] PT Time Calculation (min) (ACUTE ONLY): 24 min   Charges:   PT Evaluation $PT Eval Moderate Complexity: 1 Procedure     PT G CodesRada Hay:        Tyreonna Czaplicki Elizabeth 05/02/2015, 4:50 PM Blanchard KelchKaren Murice Barbar PT [phone]

## 2015-05-03 DIAGNOSIS — F10231 Alcohol dependence with withdrawal delirium: Secondary | ICD-10-CM

## 2015-05-03 LAB — BASIC METABOLIC PANEL
Anion gap: 10 (ref 5–15)
BUN: 22 mg/dL — AB (ref 6–20)
CHLORIDE: 100 mmol/L — AB (ref 101–111)
CO2: 23 mmol/L (ref 22–32)
Calcium: 8.3 mg/dL — ABNORMAL LOW (ref 8.9–10.3)
Creatinine, Ser: 1.04 mg/dL (ref 0.61–1.24)
GFR calc non Af Amer: >60 mL/min (ref >60)
Glucose, Bld: 169 mg/dL — ABNORMAL HIGH (ref 65–99)
POTASSIUM: 3.1 mmol/L — AB (ref 3.5–5.1)
SODIUM: 133 mmol/L — AB (ref 135–145)

## 2015-05-03 LAB — CBC
HEMATOCRIT: 28.8 % — AB (ref 39.0–52.0)
HEMOGLOBIN: 9.7 g/dL — AB (ref 13.0–17.0)
MCH: 32.7 pg (ref 26.0–34.0)
MCHC: 33.7 g/dL (ref 30.0–36.0)
MCV: 97 fL (ref 78.0–100.0)
Platelets: 80 10*3/uL — ABNORMAL LOW (ref 150–400)
RBC: 2.97 MIL/uL — AB (ref 4.22–5.81)
RDW: 14 % (ref 11.5–15.5)
WBC: 7.3 10*3/uL (ref 4.0–10.5)

## 2015-05-03 LAB — GLUCOSE, CAPILLARY
GLUCOSE-CAPILLARY: 135 mg/dL — AB (ref 65–99)
GLUCOSE-CAPILLARY: 152 mg/dL — AB (ref 65–99)
GLUCOSE-CAPILLARY: 113 mg/dL — AB (ref 65–99)
GLUCOSE-CAPILLARY: 117 mg/dL — AB (ref 65–99)
Glucose-Capillary: 131 mg/dL — ABNORMAL HIGH (ref 65–99)

## 2015-05-03 LAB — PHOSPHORUS: PHOSPHORUS: 2.4 mg/dL — AB (ref 2.5–4.6)

## 2015-05-03 MED ORDER — SODIUM CHLORIDE 0.9 % IV BOLUS (SEPSIS)
500.0000 mL | Freq: Once | INTRAVENOUS | Status: AC
  Administered 2015-05-03: 500 mL via INTRAVENOUS

## 2015-05-03 MED ORDER — SODIUM CHLORIDE 0.9 % IV SOLN
INTRAVENOUS | Status: DC
  Administered 2015-05-03 – 2015-05-04 (×3): via INTRAVENOUS

## 2015-05-03 MED ORDER — LORAZEPAM 2 MG/ML IJ SOLN
INTRAMUSCULAR | Status: AC
  Filled 2015-05-03: qty 2

## 2015-05-03 MED ORDER — K PHOS MONO-SOD PHOS DI & MONO 155-852-130 MG PO TABS
500.0000 mg | ORAL_TABLET | Freq: Two times a day (BID) | ORAL | Status: DC
  Administered 2015-05-03: 500 mg via ORAL
  Filled 2015-05-03 (×4): qty 2

## 2015-05-03 MED ORDER — LORAZEPAM 2 MG/ML IJ SOLN
4.0000 mg | Freq: Once | INTRAMUSCULAR | Status: AC
  Administered 2015-05-03: 4 mg via INTRAVENOUS

## 2015-05-03 MED ORDER — HALOPERIDOL LACTATE 5 MG/ML IJ SOLN
5.0000 mg | Freq: Once | INTRAMUSCULAR | Status: AC
  Administered 2015-05-03: 5 mg via INTRAVENOUS
  Filled 2015-05-03: qty 1

## 2015-05-03 MED ORDER — POTASSIUM CHLORIDE 10 MEQ/100ML IV SOLN
10.0000 meq | INTRAVENOUS | Status: AC
  Administered 2015-05-03 (×3): 10 meq via INTRAVENOUS
  Filled 2015-05-03 (×3): qty 100

## 2015-05-03 NOTE — Progress Notes (Signed)
[facility] PROGRESS NOTE  Johnny [name] ZDG:[id number] DOB: 09-13-1934 DOA: 04/30/2015 PCP: Rogelia Boga, MD  Assessment/Plan: 81 year old with  past medical history of Acute alcoholic hepatitis, Anemia of other chronic disease ,; Atrial fibrillation , alcohol abuse, seizure, who presents with weakness and fatigue. Patient was too weak to get up to use the bathroom and was found by EMS intervention urine and feces sitting on his wheelchair.   1-Alcohol withdrawal   Continue with CIWA protocol.  Continue with thiamine and folic acid.  Became agitated last night, required Haldol and 4 mg IV ativan. Continue to treat DT.  If ativan dos not controlled symptoms , he might need precedex gtt.   Atrial fibrillation; continue with metoprolol. Cardiology consulted.   Lactic acidosis: chest x ray negative, UA no significant WBC.  CK level normal.  Lactic acid has decrease from 4 to 2.2.  Repeated chest x ray after hydration was negative.  C diff negative.   Mildly elevated troponin;  EKG tachycardia.  Mildly elevated troponin/  ECHO with further decreases EF now at 40% , possible hypokinesis. Cardiomyopathy might be related to alcohol abuse, but will consult cardiology for further evaluation.   Acute Systolic Heart failure;  New decrease EF at 40 %.  Continue with metoprolol.  started lisinopril, monitor renal function.  Hold hydralazine SBP has decreased.   AKI; start IV fluids. Monitor for pulmonary edema.   Thrombocytopenia; suspect related to alcohol abuse.   COPD (chronic obstructive pulmonary disease),  Xopenex as needed and Atrovent   Coronary atherosclerosis stable - lipid panel, LDL 50. Continue with  Statins, denies adverse effects to statins. . Continue with aspirin.   Diabetes mellitus with peripheral vascular disease (HCC) - order to assess to sliding scale diet controlled   Essential hypertension ; continue with metoprolol. Started lisinopril. Hold   Hydralazine SBP drop to 110 overnight.   Hypomagnesemia ; replaced. Hypophosphatemia; K phos ordered.   Protein calorie malnutrition (HCC) prealbumin at 30. Nutrition consulted.  Generalized weakness likely secondary to debility although may also have some dehydration contributing. Will rehydrate faint PT OT evaluation  Left-sided weakness this is chronic unsure if it is any worsening patient is currently undergoing withdrawals- CT head negative. Will need PT. He feels left side weakness is table.   social work consult to clarify social situation  Prophylaxis: SCD, lovenox Code Status: Full Code.  Family Communication: none at bedside. Care discussed with patient.  Disposition Plan: Remain inpatient. Treatment for alcohol withdrawal, new diagnosis cardiomyopathy.    Consultants:  cardiology  Procedures:  ECHO; Ef 40 % new  Antibiotics:  none  HPI/Subjective: Patient was aggiated overnight, went in to DT.  He is confuse this am, oriented to person, no place or situation. Restless.    Objective: Filed Vitals:   05/03/15 0600 05/03/15 0601  BP: 119/51 119/51  Pulse: 76   Temp:    Resp: 20     Intake/Output Summary (Last 24 hours) at 05/03/15 0818 Last data filed at 05/03/15 0810  Gross per 24 hour  Intake 1511.67 ml  Output    317 ml  Net 1194.67 ml   Filed Weights   05/01/15 1537 05/02/15 0400  Weight: 80.1 kg (176 lb 9.4 oz) 79.1 kg (174 lb 6.1 oz)    Exam:   General:  Alert , confuse, restless.   Cardiovascular: S 1, S 2 RRR tachycardic  Respiratory: CTA  Abdomen: BS present, soft, nt  Musculoskeletal: no edema.  Data Reviewed: Basic Metabolic Panel:  Recent Labs Lab 04/30/15 2206 05/01/15 0933 05/02/15 0645 05/03/15 0335  NA 144 143 135 133*  K 4.4 3.8 3.5 3.1*  CL 109 107 101 100*  CO2 24 21* 26 23  GLUCOSE 167* 138* 129* 169*  BUN 20 13 11  22*  CREATININE 1.00 0.84 0.68 1.04  CALCIUM 9.1 8.9 8.6* 8.3*  MG 1.6* 1.7  --   --    PHOS  --  2.1* 2.2* 2.4*   Liver Function Tests:  Recent Labs Lab 04/30/15 2206 05/01/15 0933  AST 34 38  ALT 24 23  ALKPHOS 113 105  BILITOT 0.9 1.7*  PROT 7.9 7.6  ALBUMIN 3.9 3.7   No results for input(s): LIPASE, AMYLASE in the last 168 hours. No results for input(s): AMMONIA in the last 168 hours. CBC:  Recent Labs Lab 04/30/15 2206 05/01/15 0933 05/02/15 0645 05/03/15 0335  WBC 5.6 9.1 7.0 7.3  NEUTROABS 3.9  --   --   --   HGB 11.0* 11.0* 11.6* 9.7*  HCT 32.3* 32.8* 34.4* 28.8*  MCV 97.3 98.2 98.0 97.0  PLT 112* 102* 96* 80*   Cardiac Enzymes:  Recent Labs Lab 04/30/15 2206 05/01/15 0933 05/01/15 1401 05/01/15 2030  CKTOTAL 116  --   --   --   TROPONINI  --  0.87* 0.63* 0.42*   BNP (last 3 results)  Recent Labs  04/30/15 2206  BNP 39.5    ProBNP (last 3 results) No results for input(s): PROBNP in the last 8760 hours.  CBG:  Recent Labs Lab 05/02/15 1040 05/02/15 1159 05/02/15 1301 05/02/15 1623 05/02/15 2257  GLUCAP 150* 144* 112* 181* 190*    Recent Results (from the past 240 hour(s))  Urine culture     Status: None   Collection Time: 05/01/15  5:29 AM  Result Value Ref Range Status   Specimen Description URINE, CATHETERIZED  Final   Special Requests NONE  Final   Culture   Final    MULTIPLE SPECIES PRESENT, SUGGEST RECOLLECTION [facility]    Report Status 05/02/2015 FINAL  Final  MRSA PCR Screening     Status: None   Collection Time: 05/01/15  4:07 PM  Result Value Ref Range Status   MRSA by PCR NEGATIVE NEGATIVE Final    Comment:        The GeneXpert MRSA Assay (FDA approved for NASAL specimens only), is one component of a comprehensive MRSA colonization surveillance program. It is not intended to diagnose MRSA infection nor to guide or monitor treatment for MRSA infections.   C difficile quick scan w PCR reflex     Status: None   Collection Time: 05/02/15 11:15 AM  Result Value Ref Range  Status   C Diff antigen NEGATIVE NEGATIVE Final   C Diff toxin NEGATIVE NEGATIVE Final   C Diff interpretation Negative for toxigenic C. difficile  Final     Studies: Dg Chest Port 1 View  05/01/2015  CLINICAL DATA:  Cough.  Worsening weakness, fatigue. EXAM: PORTABLE CHEST 1 VIEW COMPARISON:  04/30/2015 FINDINGS: Prior CABG. Heart is normal size. Lungs are clear. No effusions. No acute bony abnormality. IMPRESSION: No active disease. Electronically Signed   By: Charlett NoseKevin  [place] M.D.   On: 05/01/2015 11:20    Scheduled Meds: . antiseptic oral rinse  7 mL Mouth Rinse BID  . aspirin EC  81 mg Oral Daily  . atorvastatin  10 mg Oral q1800  .  enoxaparin (LOVENOX) injection  40 mg Subcutaneous Q24H  . feeding supplement (ENSURE ENLIVE)  237 mL Oral BID BM  . folic acid  1 mg Oral Daily  . insulin aspart  0-5 Units Subcutaneous QHS  . insulin aspart  0-9 Units Subcutaneous TID WC  . lisinopril  20 mg Oral Daily  . metoprolol  75 mg Oral BID  . phosphorus  500 mg Oral BID  . potassium chloride  10 mEq Intravenous Q1 Hr x 3  . sodium chloride  3 mL Intravenous Q12H  . tamsulosin  0.4 mg Oral Daily  . thiamine  100 mg Oral Daily   Continuous Infusions: . sodium chloride      Active Problems:   Diabetes mellitus with peripheral vascular disease (HCC)   Essential hypertension   Coronary atherosclerosis   Paroxysmal atrial fibrillation (HCC)   Alcohol withdrawal (HCC)   Hypomagnesemia   COPD (chronic obstructive pulmonary disease), presumed   Protein calorie malnutrition (HCC)   Weakness   Altered mental status   Alcoholic cardiomyopathy (HCC)    Time spent: 35 minutes.     Hartley Barefoot A  [facility] Pager [phone]. If 7PM-7AM, please contact night-coverage at [email or web address], password [facility] 05/03/2015, 8:18 AM  LOS: 2 days

## 2015-05-03 NOTE — Progress Notes (Signed)
Stable from cardiac standpoint. Primary team is adjusting CHF meds appropriately. Remains in NSR. We will sign off now. See consult note yesterday for complete recommendations. Will need cardiology follow up post discharge.  Peter SwazilandJordan MD, [facility]

## 2015-05-04 DIAGNOSIS — G9340 Encephalopathy, unspecified: Secondary | ICD-10-CM

## 2015-05-04 DIAGNOSIS — N179 Acute kidney failure, unspecified: Secondary | ICD-10-CM

## 2015-05-04 DIAGNOSIS — D696 Thrombocytopenia, unspecified: Secondary | ICD-10-CM

## 2015-05-04 DIAGNOSIS — F10230 Alcohol dependence with withdrawal, uncomplicated: Secondary | ICD-10-CM

## 2015-05-04 DIAGNOSIS — E872 Acidosis: Secondary | ICD-10-CM

## 2015-05-04 DIAGNOSIS — I5022 Chronic systolic (congestive) heart failure: Secondary | ICD-10-CM

## 2015-05-04 LAB — GLUCOSE, CAPILLARY
GLUCOSE-CAPILLARY: 117 mg/dL — AB (ref 65–99)
Glucose-Capillary: 112 mg/dL — ABNORMAL HIGH (ref 65–99)
Glucose-Capillary: 79 mg/dL (ref 65–99)
Glucose-Capillary: 89 mg/dL (ref 65–99)

## 2015-05-04 MED ORDER — MORPHINE SULFATE (PF) 2 MG/ML IV SOLN
2.0000 mg | Freq: Once | INTRAVENOUS | Status: AC
  Administered 2015-05-04: 2 mg via INTRAVENOUS
  Filled 2015-05-04: qty 1

## 2015-05-04 MED ORDER — MORPHINE SULFATE (PF) 2 MG/ML IV SOLN
1.0000 mg | INTRAVENOUS | Status: DC | PRN
  Administered 2015-05-04 – 2015-05-07 (×6): 2 mg via INTRAVENOUS
  Filled 2015-05-04 (×6): qty 1

## 2015-05-04 NOTE — Progress Notes (Signed)
Patient given mashed potatoes per carb mod diet and choked on food.  Patient HR went to 140's and then returned to 110-120's.  Notified Claiborne Billingsallahan, NP who ordered swallow eval.

## 2015-05-04 NOTE — Consult Note (Signed)
   [facility]   05/04/2015  Jarvis NewcomerDan E Byrd 09/05/1934 [id number]   [facility]. Been following along for an appropriate time to engage for potential [facility]. Noted discharge plan is for SNF. Will not engage for services at this time. Will continue to follow and engage if appropriate.  Raiford NobleAtika Hall, MSN-Ed, RN,BSN [facility] Care Management Hospital Liaison [phone]

## 2015-05-04 NOTE — NC FL2 (Signed)
[place] MEDICAID FL2 LEVEL OF CARE SCREENING TOOL     IDENTIFICATION  Patient Name: Johnny [name] Birthdate: 1934/06/19 Sex: male Admission Date (Current Location): 04/30/2015  [facility] and [place] Number:  Producer, television/film/video and Address:  [facility],  [address] [place], [place] [address]      Provider Number: [id number]  Attending Physician Name and Address:  Alison Murray, MD  Relative Name and Phone Number:  Martie Lee, daughter, [phone]    Current Level of Care: Hospital Recommended Level of Care: Skilled Nursing Facility Prior Approval Number:    Date Approved/Denied:   PASRR Number: [id number]  Discharge Plan: SNF    Current Diagnoses: Patient Active Problem List   Diagnosis Date Noted  . Alcoholic cardiomyopathy (HCC) 84/69/6295  . Weakness 05/01/2015  . Altered mental status 05/01/2015  . Sinus tachycardia (HCC)   . Debility   . Leg edema, left   . COPD (chronic obstructive pulmonary disease), presumed 04/26/2012  . Failure to wean from mechanical ventilation (HCC) 04/26/2012  . Hypervolemia 04/26/2012  . Electrolyte and fluid disorder 04/26/2012  . Protein calorie malnutrition (HCC) 04/26/2012  . Poor dentition 04/26/2012  . UTI (lower urinary tract infection) resolved.  04/26/2012  . Atrial flutter (HCC) 04/13/2012  . Tracheostomy dependence (HCC) 04/11/2012  . Protein-calorie malnutrition, severe (HCC) 04/11/2012  . Acute respiratory failure with hypoxia, resolved 04/02/2012  . s/p PEA Cardiac arrest: Likely related to pneumonia, acidosis, hypoxic respiratory failure in setting of ETOH withdrawal 04/02/2012  . Septic shock(785.52) in setting of pneumonia (resolved) 04/02/2012  . Anoxic Encephalopathy: due to hypoxia after cardiac arrest.  04/02/2012  . HCAP (healthcare-associated pneumonia) -->resolved 04/01/2012  . Alcohol dependence s/p withdrawl  04/01/2012  . acute renal failure (resolved): Baseline creatinine 0.95 from 01/05/12.   04/01/2012  . SIRS (systemic inflammatory response syndrome) (HCC) 12/30/2011  . Hypomagnesemia 12/30/2011  . Weakness generalized 12/29/2011  . Alcohol withdrawal (HCC) 12/29/2011  . Hypertensive urgency 12/29/2011  . Tachycardia 04/28/2011  . Lower extremity weakness 04/28/2011  . ACUTE ALCOHOLIC HEPATITIS 05/20/2010  . Anemia of other chronic disease 08/16/2007  . Paroxysmal atrial fibrillation (HCC) 08/12/2007  . Diabetes mellitus with peripheral vascular disease (HCC) 12/11/2006  . Essential hypertension 12/11/2006  . Coronary atherosclerosis 12/11/2006    Orientation RESPIRATION BLADDER Height & Weight    Self  Normal Indwelling catheter 5\' 11"  (180.3 cm) 174 lbs.  BEHAVIORAL SYMPTOMS/MOOD NEUROLOGICAL BOWEL NUTRITION STATUS      Incontinent Diet (Carb modified)  AMBULATORY STATUS COMMUNICATION OF NEEDS Skin   Limited Assist Verbally Normal                       Personal Care Assistance Level of Assistance  Bathing, Feeding, Dressing Bathing Assistance: Limited assistance Feeding assistance: Limited assistance Dressing Assistance: Limited assistance     Functional Limitations Info  Speech     Speech Info: Impaired    SPECIAL CARE FACTORS FREQUENCY  PT (By licensed PT), OT (By licensed OT)     PT Frequency: Min 3x/week OT Frequency: Min 2x/week            Contractures      Additional Factors Info  Insulin Sliding Scale               Current Medications (05/04/2015):  This is the current hospital active medication list Current Facility-Administered Medications  Medication Dose Route Frequency Provider Last Rate Last Dose  . 0.9 %  sodium chloride  infusion   Intravenous Continuous Alison MurrayAlma M Devine, MD 10 mL/hr at 05/04/15 1137    . acetaminophen (TYLENOL) tablet 650 mg  650 mg Oral Q6H PRN Therisa DoyneAnastassia Doutova, MD       Or  . acetaminophen (TYLENOL) suppository 650 mg  650 mg Rectal Q6H PRN Therisa DoyneAnastassia Doutova, MD      . antiseptic oral rinse (CPC  / CETYLPYRIDINIUM CHLORIDE 0.05%) solution 7 mL  7 mL Mouth Rinse BID Belkys A Regalado, MD   7 mL at 05/04/15 1130  . aspirin EC tablet 81 mg  81 mg Oral Daily Therisa DoyneAnastassia Doutova, MD   81 mg at 05/03/15 0930  . atorvastatin (LIPITOR) tablet 10 mg  10 mg Oral q1800 Belkys A Regalado, MD   10 mg at 05/03/15 1733  . feeding supplement (ENSURE ENLIVE) (ENSURE ENLIVE) liquid 237 mL  237 mL Oral BID BM Dionne AnoWilliam M Ward, RD   237 mL at 05/03/15 0932  . folic acid (FOLVITE) tablet 1 mg  1 mg Oral Daily Therisa DoyneAnastassia Doutova, MD   1 mg at 05/03/15 0931  . hydrALAZINE (APRESOLINE) injection 10 mg  10 mg Intravenous Q4H PRN Belkys A Regalado, MD   10 mg at 05/04/15 0213  . HYDROcodone-acetaminophen (NORCO/VICODIN) 5-325 MG per tablet 1-2 tablet  1-2 tablet Oral Q4H PRN Therisa DoyneAnastassia Doutova, MD   1 tablet at 05/03/15 0032  . insulin aspart (novoLOG) injection 0-5 Units  0-5 Units Subcutaneous QHS Therisa DoyneAnastassia Doutova, MD   0 Units at 05/01/15 2157  . insulin aspart (novoLOG) injection 0-9 Units  0-9 Units Subcutaneous TID WC Therisa DoyneAnastassia Doutova, MD   1 Units at 05/03/15 1206  . ipratropium (ATROVENT) nebulizer solution 0.5 mg  0.5 mg Nebulization Q4H PRN Belkys A Regalado, MD      . levalbuterol (XOPENEX) nebulizer solution 0.63 mg  0.63 mg Nebulization Q4H PRN Therisa DoyneAnastassia Doutova, MD      . lisinopril (PRINIVIL,ZESTRIL) tablet 20 mg  20 mg Oral Daily Belkys A Regalado, MD   20 mg at 05/03/15 0931  . LORazepam (ATIVAN) injection 2-3 mg  2-3 mg Intravenous Q1H PRN Belkys A Regalado, MD   2 mg at 05/04/15 0338  . metoprolol tartrate (LOPRESSOR) tablet 75 mg  75 mg Oral BID Therisa DoyneAnastassia Doutova, MD   75 mg at 05/03/15 0930  . morphine 2 MG/ML injection 1-2 mg  1-2 mg Intravenous Q4H PRN Leanne ChangKatherine P Schorr, NP   2 mg at 05/04/15 0518  . ondansetron (ZOFRAN) tablet 4 mg  4 mg Oral Q6H PRN Therisa DoyneAnastassia Doutova, MD       Or  . ondansetron (ZOFRAN) injection 4 mg  4 mg Intravenous Q6H PRN Therisa DoyneAnastassia Doutova, MD      . phosphorus (K  PHOS NEUTRAL) tablet 500 mg  500 mg Oral BID Belkys A Regalado, MD   500 mg at 05/03/15 0931  . sodium chloride 0.9 % injection 3 mL  3 mL Intravenous Q12H Therisa DoyneAnastassia Doutova, MD   3 mL at 05/04/15 1141  . tamsulosin (FLOMAX) capsule 0.4 mg  0.4 mg Oral Daily Therisa DoyneAnastassia Doutova, MD   0.4 mg at 05/03/15 0931  . thiamine (VITAMIN B-1) tablet 100 mg  100 mg Oral Daily Therisa DoyneAnastassia Doutova, MD   100 mg at 05/03/15 0930     Discharge Medications: Please see discharge summary for a list of discharge medications.  Relevant Imaging Results:  Relevant Lab Results:   Additional Information EAV:[id number]  Meyer CoryWashington, Lirio Bach D, [place]

## 2015-05-04 NOTE — Progress Notes (Signed)
PT Cancellation Note  Patient Details Name: Johnny NewcomerDan E Korpi MRN: [id number] DOB: Oct 01, 1934   Cancelled Treatment           Unable to arouse enough to participate.  Will check back later as schedule permits      Armando ReichertKropski, Mckinnon Glick Ann 05/04/2015, 10:30 AM

## 2015-05-04 NOTE — Clinical Social Work Note (Signed)
CSW spoke to pt's daughter, Martie LeeSabrina, by phone as pt is currently disoriented.  CSW explained that PT is recommending STR and inquired whether pt/family would be interested in going to SNF.  Daughter expressed interest in pt going to SNF.  [facility].  CSW will complete FL2, initiate bed search, authorization and continue to assist pt with d/c planning needs.  FlandersLynn Luda [place], [place] [phone]

## 2015-05-04 NOTE — Progress Notes (Signed)
Patient transferred to 5E room 1511.  Placed on telemetry box 62 and CCMD notified, verified with 2 RNs.  Patient very drowsy and speech is incomprehensible.  Vitals obtained.  Patient placed on bed alarm.  Will continue to monitor.

## 2015-05-04 NOTE — Progress Notes (Signed)
Patient ID: Johnny [name], male   DOB: [birth year], 81 y.o.   MRN: [id number] [facility] PROGRESS NOTE  Johnny [name] [id number] DOB: [birth year] DOA: 04/30/2015 PCP: Rogelia Boga, MD  Brief narrative:    81 year old male with past medical history alcohol abuse, related alcohol hepatitis, atrial fibrillation, seizures likely related to alcohol abuse and withdrawal who presented to [facility] long hospital 04/30/2015 with weakness and fatigue. He was found by EMS covered in urine and feces while sitting on his wheelchair. He was placed in step down unit for monitoring for withdrawals. Transfer to telemetry today 05/04/2015.  Assessment/Plan:    Principal Problem: Alcohol withdrawal / Acute encephalopathy  - Continue with CIWA protocol.  - Continue with thiamine and folic acid.  - Stable so far. Stable for transfer to telemetry floor today  Active Problems: Atrial fibrillation - CHADS vasc score 4 (age, gender, hypertension, CHF) - Patient on anticoagulation with aspirin. He would not be candidate for other anticoagulants because of history of alcohol abuse. - Rate is controlled with metoprolol - Cardiology has seen the patient in consultation and now signed off with no further recommendations other than to continue present management.  Lactic acidosis - Probably related to history of alcohol abuse - Lactic acid has improved since the admission - No acute findings on chest x-ray. No sign of acute infectious process. C. difficile negative.  Elevated troponin level / Coronary atherosclerosis stable, native artery, without angina pectoris - Likely demand ischemia from alcohol withdrawal - 2-D echo on this admission showed ejection fraction of 40% - No reports of chest pain  Chronic Systolic Congestive Heart failure / alcohol induced cardiomyopathy / acute respiratory failure with hypoxia - 2-D echo on this admission showed ejection fraction of 40% - Likely related to  alcohol-induced cardiomyopathy - Continue metoprolol - Continue lisinopril. Monitor renal function while patient is on lisinopril - Stable respiratory status  Acute kidney injury - Perhaps related to dehydration from alcohol abuse, withdrawal - Monitor renal function while patient on lisinopril  Thrombocytopenia - Likely secondary to bone marrow suppression from alcohol abuse - Platelet count is 80 - Stop Lovenox subcutaneous and use SCDs for DVT prophylaxis  COPD (chronic obstructive pulmonary disease) - Xopenex and atrovent as needed for shortness of breath or wheezing  - Stable respiratory status   Diabetes mellitus with peripheral vascular disease without long-term insulin use (HCC)  - Continue SSI - CBG's in past 24 hours: 117, 117, 112  Essential hypertension - Continue metoprolol and lisinopril  Hypomagnesemia / Hypophosphatemia - Due to alcohol abuse - Supplemented   Severe protein calorie malnutrition (HCC) - Nutrition consulted   Generalized weakness  - Likely from history of alcohol abuse - PT eval - recommendation for SNF placement - Appreciate SW assistance with discharge planning  - CT head negative   DVT prophylaxis - Stop Lovenox subcutaneous because of thrombocytopenia and use SCDs bilaterally   Code Status: Full.  Family Communication: family not at the bedside this am Disposition Plan: transfer to telemetry today   IV access:  Peripheral IV  Procedures and diagnostic studies:    Dg Chest 2 View 04/30/2015  No active cardiopulmonary disease. Electronically Signed   By: Lupita Raider, M.D.   On: 04/30/2015 22:54   Ct Head Wo Contrast 05/01/2015 No acute intracranial hemorrhage. Age-related atrophy and chronic microvascular ischemic disease. If symptoms persist and there are no contraindications, MRI may provide better evaluation if clinically indicated. Electronically Signed  By: Elgie CollardArash  Radparvar M.D.   On: 05/01/2015 02:56   Dg Chest Port 1 View  05/01/2015  No active disease. Electronically Signed   By: Charlett NoseKevin  Dover M.D.   On: 05/01/2015 11:20   Medical Consultants:  Cardiology  Other Consultants:  Physical therapy  IAnti-Infectives:   None    Manson PasseyEVINE, Telesforo Brosnahan, MD  [facility] Pager [phone]  Time spent in minutes: 25 minutes  If 7PM-7AM, please contact night-coverage [email or web address] Password [email or web address] 05/04/2015, 8:32 AM   LOS: 3 days    HPI/Subjective: No acute overnight events. Patient lethargic.   Objective: Filed Vitals:   05/04/15 0600 05/04/15 0637 05/04/15 0700 05/04/15 0807  BP: 176/70 140/62 157/72   Pulse: 106 105 109   Temp:    97.4 F (36.3 C)  TempSrc:    Axillary  Resp: 31 21 31    Height:      Weight:      SpO2: 100% 98% 98%     Intake/Output Summary (Last 24 hours) at 05/04/15 0832 Last data filed at 05/04/15 0600  Gross per 24 hour  Intake   1650 ml  Output   1800 ml  Net   -150 ml    Exam:   General:  Pt is not in acute distress  Cardiovascular: tachycardic, S1/S2 (+)  Respiratory: Clear to auscultation bilaterally, no wheezing, no crackles, no rhonchi  Abdomen: Soft, non tender, non distended, bowel sounds present  Extremities: No edema, pulses DP and PT palpable bilaterally  Neuro: Grossly nonfocal  Data Reviewed: Basic Metabolic Panel:  Recent Labs Lab 04/30/15 2206 05/01/15 0933 05/02/15 0645 05/03/15 0335  NA 144 143 135 133*  K 4.4 3.8 3.5 3.1*  CL 109 107 101 100*  CO2 24 21* 26 23  GLUCOSE 167* 138* 129* 169*  BUN 20 13 11  22*  CREATININE 1.00 0.84 0.68 1.04  CALCIUM 9.1 8.9 8.6* 8.3*  MG 1.6* 1.7  --   --   PHOS  --  2.1* 2.2* 2.4*   Liver Function Tests:  Recent Labs Lab 04/30/15 2206 05/01/15 0933  AST 34 38  ALT 24 23  ALKPHOS 113 105  BILITOT 0.9 1.7*  PROT 7.9 7.6  ALBUMIN 3.9 3.7   No results for input(s): LIPASE, AMYLASE in the last 168 hours. No results for input(s): AMMONIA in the last 168 hours. CBC:  Recent Labs Lab  04/30/15 2206 05/01/15 0933 05/02/15 0645 05/03/15 0335  WBC 5.6 9.1 7.0 7.3  NEUTROABS 3.9  --   --   --   HGB 11.0* 11.0* 11.6* 9.7*  HCT 32.3* 32.8* 34.4* 28.8*  MCV 97.3 98.2 98.0 97.0  PLT 112* 102* 96* 80*   Cardiac Enzymes:  Recent Labs Lab 04/30/15 2206 05/01/15 0933 05/01/15 1401 05/01/15 2030  CKTOTAL 116  --   --   --   TROPONINI  --  0.87* 0.63* 0.42*   BNP: Invalid input(s): POCBNP CBG:  Recent Labs Lab 05/03/15 1147 05/03/15 1622 05/03/15 1737 05/03/15 2134 05/04/15 0740  GLUCAP 131* 152* 113* 117* 117*    Urine culture     Status: None   Collection Time: 05/01/15  5:29 AM  Result Value Ref Range Status   Specimen Description URINE, CATHETERIZED  Final   Special Requests NONE  Final   Culture   Final    MULTIPLE SPECIES PRESENT, SUGGEST RECOLLECTION [facility]formed [facility]    Report Status 05/02/2015 FINAL  Final  MRSA PCR Screening  Status: None   Collection Time: 05/01/15  4:07 PM  Result Value Ref Range Status   MRSA by PCR NEGATIVE NEGATIVE Final  C difficile quick scan w PCR reflex     Status: None   Collection Time: 05/02/15 11:15 AM  Result Value Ref Range Status   C Diff antigen NEGATIVE NEGATIVE Final   C Diff toxin NEGATIVE NEGATIVE Final   C Diff interpretation Negative for toxigenic C. difficile  Final     Scheduled Meds: . aspirin EC  81 mg Oral Daily  . atorvastatin  10 mg Oral q1800  . enoxaparin (LOVENOX) injection  40 mg Subcutaneous Q24H  . feeding supplement (ENSURE ENLIVE)  237 mL Oral BID BM  . folic acid  1 mg Oral Daily  . insulin aspart  0-5 Units Subcutaneous QHS  . insulin aspart  0-9 Units Subcutaneous TID WC  . lisinopril  20 mg Oral Daily  . metoprolol  75 mg Oral BID  . phosphorus  500 mg Oral BID  . tamsulosin  0.4 mg Oral Daily  . thiamine  100 mg Oral Daily   Continuous Infusions: . sodium chloride 75 mL/hr at 05/03/15 2213

## 2015-05-05 LAB — GLUCOSE, CAPILLARY
GLUCOSE-CAPILLARY: 102 mg/dL — AB (ref 65–99)
Glucose-Capillary: 72 mg/dL (ref 65–99)
Glucose-Capillary: 76 mg/dL (ref 65–99)
Glucose-Capillary: 93 mg/dL (ref 65–99)

## 2015-05-05 MED ORDER — METOPROLOL TARTRATE 1 MG/ML IV SOLN: 5.0000 mg | Freq: Three times a day (TID) | INTRAVENOUS | Status: DC

## 2015-05-05 MED ORDER — THIAMINE HCL 100 MG/ML IJ SOLN
100.0000 mg | Freq: Every day | INTRAMUSCULAR | Status: DC
  Administered 2015-05-05 – 2015-05-08 (×4): 100 mg via INTRAVENOUS
  Filled 2015-05-05 (×4): qty 1

## 2015-05-05 MED ORDER — METOPROLOL TARTRATE 1 MG/ML IV SOLN
5.0000 mg | Freq: Once | INTRAVENOUS | Status: AC
  Administered 2015-05-05: 5 mg via INTRAVENOUS
  Filled 2015-05-05: qty 5

## 2015-05-05 MED ORDER — DEXTROSE-NACL 5-0.45 % IV SOLN
INTRAVENOUS | Status: AC
  Administered 2015-05-05: 13:00:00 via INTRAVENOUS

## 2015-05-05 MED ORDER — METOPROLOL TARTRATE 1 MG/ML IV SOLN
5.0000 mg | Freq: Four times a day (QID) | INTRAVENOUS | Status: DC
  Administered 2015-05-05 – 2015-05-08 (×13): 5 mg via INTRAVENOUS
  Filled 2015-05-05 (×13): qty 5

## 2015-05-05 MED ORDER — FOLIC ACID 5 MG/ML IJ SOLN
1.0000 mg | Freq: Every day | INTRAMUSCULAR | Status: DC
  Administered 2015-05-05 – 2015-05-08 (×4): 1 mg via INTRAVENOUS
  Filled 2015-05-05 (×4): qty 0.2

## 2015-05-05 NOTE — Clinical Social Work Note (Signed)
CSW left a msg with pt's dt, Suprena [phone] to let her know [facility]. This was the daughter's first choice. CSW will continue to follow for d/c needs.    Etta QuillSarah Gonzalez-Graham, LCSW [phone] Hospital [facility] & 5E, 5W 31-35 Licensed Clinical Social Worker

## 2015-05-05 NOTE — Progress Notes (Signed)
Paged Alejandro Mullingom Callhan NP about the Pt's HR in the 130's to 140's. Orders received for IV push Lopressor, HR 103, will continue to monitor. Estill DoomsSimon, Bonnee Zertuche Mahario, RN 1:42 AM

## 2015-05-05 NOTE — Evaluation (Signed)
Clinical/Bedside Swallow Evaluation Patient Details  Name: Johnny [name] MRN: [id number] Date of Birth: August 03, 1934  Today's Date: 05/05/2015 Time: SLP Start Time (ACUTE ONLY): 1345 SLP Stop Time (ACUTE ONLY): 1415 SLP Time Calculation (min) (ACUTE ONLY): 30 min  Past Medical History:  Past Medical History  Diagnosis Date  . Acute alcoholic hepatitis 05/20/2010  . Anemia of other chronic disease 08/16/2007  . CAD (coronary artery disease)     a. s/p CABG, notes unclear - 1998 or 2003?  Marland Kitchen Essential hypertension   . Hyperlipidemia   . ETOH abuse   . Chronic low back pain   . Pneumonia     "now; never before" (04/01/2012)  . Hallucination 04/01/2012  . Arthritis   . Altered mental status 04/01/2012  . Seizures (HCC) 04/01/2012  . Diabetes mellitus (HCC)   . History of stroke   . Paroxysmal atrial fibrillation (HCC)   . Paroxysmal atrial flutter (HCC)     a. s/p ablation 2007.  . Coagulopathy (HCC)   . Protein calorie malnutrition (HCC)   . PEA (Pulseless electrical activity) (HCC)     a. h/o PEA cardiac arrest 04/2012 (felt r/t PNA, acidosis, hypoxic resp failure in setting of alcohol withdrawal; required tracheostomy)   Past Surgical History:  Past Surgical History  Procedure Laterality Date  . Coronary artery bypass graft  ~ 2003    CABG X4  . Cataract extraction w/ intraocular lens  implant, bilateral      "years ago" (04/01/2012)  . Esophagogastroduodenoscopy  04/13/2012    Procedure: ESOPHAGOGASTRODUODENOSCOPY (EGD);  Surgeon: Florencia Reasons, MD;  Location: [facility] ENDOSCOPY;  Service: Endoscopy;  Laterality: N/A;  push peg  . Peg placement  04/13/2012    Procedure: PERCUTANEOUS ENDOSCOPIC GASTROSTOMY (PEG) PLACEMENT;  Surgeon: Florencia Reasons, MD;  Location: [facility] ENDOSCOPY;  Service: Endoscopy;  Laterality: N/A;   HPI:  81 yo male adm to [facility] with fatigue/weakness, going through ETOH w/d.  PMH + for respiratory failure requiring trach/vent, cardiac arrest, Gtube = removed May  2014, seizures.  Yesterday pt with copious coughing with intake and concern for aspiration.  Swallow evaluation ordered.  Pt was made NPO.     Assessment / Plan / Recommendation Clinical Impression  Pt presents with signs of oropharyngeal dysphagia and probable compromise airway protection likely exacerbated by current mental status.  Pt did follow some commands but is very dysarthric and weak.    Poor laryngeal elevation noted across all consistencies with pt conducting 5 swallows with a single tsp of nectar liquids - concerning for residuals.  Cough and throat clearing preceded by wet phonation- concerning for laryngeal penetration.   Coughing noted with intake with viscous slightly yellow tinged secretions orally suctioned.    Pt tolerated applesauce consistencies well without overt signs of aspiration, therefore rec medicine with applesauce = crushed if not contraindicated.    Recommend continue npo at this time except tsps of water and medicine with applesauce.  SLP to follow up for po readiness and/or indication for instrumental swallow eval.  Given pt has h/o dysphagia, suspect he will benefit from instrumental eval before resuming po diet.  Educated pt to findings, clinical reasoning for recommendations.  Paged Md with recs and informed RN.      Aspiration Risk  Severe aspiration risk    Diet Recommendation NPO;NPO except meds (tsps thin water )   Liquid Administration via: Spoon Medication Administration: Crushed with puree Supervision: Full supervision/cueing for compensatory strategies Compensations: Minimize environmental distractions;Slow rate;Small  sips/bites Postural Changes: Seated upright at 90 degrees;Remain upright for at least 30 minutes after po intake    Other  Recommendations Oral Care Recommendations: Oral care QID   Follow up Recommendations    TBD    Frequency and Duration min 2x/week  2 weeks       Prognosis Prognosis for Safe Diet Advancement:  Fair Barriers to Reach Goals: Cognitive deficits;Behavior;Other (Comment) (? baseline deficits, mental status)      Swallow Study   General Date of Onset: 05/05/15 HPI: 81 [facility], going through ETOH w/d.  PMH + for respiratory failure requiring trach/vent, cardiac arrest, Gtube = removed May 2014, seizures.  Yesterday pt with copious coughing with intake and concern for aspiration.  Swallow evaluation ordered.  Pt was made NPO.   Type of Study: Bedside Swallow Evaluation Diet Prior to this Study: NPO Temperature Spikes Noted: No Respiratory Status: Room air History of Recent Intubation: No Behavior/Cognition: Lethargic/Drowsy Oral Cavity Assessment: Dried secretions Oral Care Completed by SLP: Yes Oral Cavity - Dentition: Missing dentition;Poor condition Self-Feeding Abilities: Total assist Patient Positioning: Upright in bed Baseline Vocal Quality: Low vocal intensity;Hoarse;Breathy Volitional Cough: Weak Volitional Swallow: Unable to elicit    Oral/Motor/Sensory Function Overall Oral Motor/Sensory Function: Generalized oral weakness   Ice Chips Ice chips: Not tested   Thin Liquid Thin Liquid: Impaired Presentation: Spoon Oral Phase Impairments: Reduced labial seal;Reduced lingual movement/coordination Oral Phase Functional Implications: Prolonged oral transit Pharyngeal  Phase Impairments: Suspected delayed Swallow;Multiple swallows;Decreased hyoid-laryngeal movement;Cough - Delayed    Nectar Thick Nectar Thick Liquid: Impaired Presentation: Spoon;Cup Oral Phase Impairments: Reduced lingual movement/coordination;Reduced labial seal Oral phase functional implications: Prolonged oral transit Pharyngeal Phase Impairments: Suspected delayed Swallow;Multiple swallows;Decreased hyoid-laryngeal movement (multiple swallows x5 with tsp bolus)   Honey Thick Honey Thick Liquid: Not tested   Puree Puree: Impaired Presentation: Spoon Oral Phase  Impairments: Reduced labial seal;Reduced lingual movement/coordination Oral Phase Functional Implications: Prolonged oral transit Pharyngeal Phase Impairments: Suspected delayed Swallow;Decreased hyoid-laryngeal movement;Multiple swallows   Solid   GO   Solid: Not tested        Donavan Burnetamara Elmarie Devlin, MS [facility] SLP [phone]

## 2015-05-05 NOTE — Progress Notes (Signed)
Patient ID: Johnny [name], male   DOB: 07/16/1934, 81 y.o.   MRN: [id number] [facility] PROGRESS NOTE  Johnny [name] [id number] DOB: Sep 18, 1934 DOA: 04/30/2015 PCP: Rogelia Boga, MD  Brief narrative:    81 year old male with past medical history alcohol abuse, related alcohol hepatitis, atrial fibrillation, seizures likely related to alcohol abuse and withdrawal who presented to [facility] hospital 04/30/2015 with weakness and fatigue. He was found by EMS covered in urine and feces while sitting on his wheelchair. He was placed in step down unit for monitoring for withdrawals.  Transferred to telemetry 05/04/2015.  Assessment/Plan:    Principal Problem: Alcohol withdrawal / Acute encephalopathy  - Continue with CIWA protocol.  - Continue with thiamine and folic acid.   Active Problems: Atrial fibrillation - CHADS vasc score 4 (age, gender, hypertension, CHF) - Patient on anticoagulation with aspirin.  - Rate controlled with metoprolol - Cardiology has seen the patient in consultation and now signed off with no further recommendations other than to continue present management.  Lactic acidosis - Related to history of alcohol abuse - Lactic acid has improved since the admission - No acute findings on chest x-ray. No sign of acute infectious process. C. difficile negative.  Elevated troponin level / Coronary atherosclerosis stable, native artery, without angina pectoris - Likely demand ischemia from alcohol withdrawal - 2-D echo on this admission showed ejection fraction of 40% - No reports of chest pain  Chronic Systolic Congestive Heart failure / alcohol induced cardiomyopathy / acute respiratory failure with hypoxia - 2-D echo on this admission showed ejection fraction of 40% - Likely related to alcohol-induced cardiomyopathy - Continue metoprolol - Stable respiratory status  Acute kidney injury - Perhaps related to dehydration from alcohol abuse,  withdrawal - Check BMP in am  Thrombocytopenia - Likely secondary to bone marrow suppression from alcohol abuse - Platelet count is 80 - We stopped Lovenox subQ 1/13 - Check CBC in am  COPD (chronic obstructive pulmonary disease) - Xopenex and atrovent as needed for shortness of breath or wheezing  - Stable respiratory status   Diabetes mellitus with peripheral vascular disease without long-term insulin use (HCC)  - Continue SSI  Essential hypertension - Continue metoprolol IV regimen and hold any PO meds - BP stable   Hypomagnesemia / Hypophosphatemia - Due to alcohol abuse - Supplemented  - Check BMP and magnesium in am   Severe protein calorie malnutrition (HCC) - Seen by nutritionist - Too lethargic to eat   Generalized weakness  - Due to history of alcohol abuse - CT head negative  - PT eval - recommendation for SNF placement  DVT prophylaxis - SCD's due to thrombocytopenia    Code Status: Full.  Family Communication: family not at the bedside this am Disposition Plan: likely home once mental status better, by 05/07/2015   IV access:  Peripheral IV  Procedures and diagnostic studies:    Dg Chest 2 View 04/30/2015  No active cardiopulmonary disease. Electronically Signed   By: Lupita Raider, M.D.   On: 04/30/2015 22:54   Ct Head Wo Contrast 05/01/2015 No acute intracranial hemorrhage. Age-related atrophy and chronic microvascular ischemic disease. If symptoms persist and there are no contraindications, MRI may provide better evaluation if clinically indicated. Electronically Signed   By: Elgie Collard M.D.   On: 05/01/2015 02:56   Dg Chest Port 1 View 05/01/2015  No active disease. Electronically Signed   By: Charlett Nose M.D.   On:  05/01/2015 11:20   Medical Consultants:  Cardiology  Other Consultants:  Physical therapy  IAnti-Infectives:   None    Manson Passey, MD  [facility] Pager [phone]  Time spent in minutes: 25 minutes  If  7PM-7AM, please contact night-coverage [email or web address] Password [email or web address] 05/05/2015, 1:29 PM   LOS: 4 days    HPI/Subjective: No acute overnight events. Patient still lethargic.   Objective: Filed Vitals:   05/04/15 1400 05/04/15 1520 05/04/15 2200 05/05/15 1317  BP: 157/101 147/57 162/94 127/82  Pulse: 95 103 102 112  Temp:  97.3 F (36.3 C) 98.7 F (37.1 C)   TempSrc:  Axillary    Resp: 25 20 20    Height:      Weight:      SpO2: 100% 100% 100%     Intake/Output Summary (Last 24 hours) at 05/05/15 1329 Last data filed at 05/05/15 1314  Gross per 24 hour  Intake     13 ml  Output    401 ml  Net   -388 ml    Exam:   General:  Pt is lethargic, no distress  Cardiovascular: tachycardic, appreciate S1, S2   Respiratory: No wheezing, no crackles, no rhonchi  Abdomen: non tender, non distended, bowel sounds present  Extremities: No leg swelling, pulses palpable bilaterally  Neuro: Nonfocal  Data Reviewed: Basic Metabolic Panel:  Recent Labs Lab 04/30/15 2206 05/01/15 0933 05/02/15 0645 05/03/15 0335  NA 144 143 135 133*  K 4.4 3.8 3.5 3.1*  CL 109 107 101 100*  CO2 24 21* 26 23  GLUCOSE 167* 138* 129* 169*  BUN 20 13 11  22*  CREATININE 1.00 0.84 0.68 1.04  CALCIUM 9.1 8.9 8.6* 8.3*  MG 1.6* 1.7  --   --   PHOS  --  2.1* 2.2* 2.4*   Liver Function Tests:  Recent Labs Lab 04/30/15 2206 05/01/15 0933  AST 34 38  ALT 24 23  ALKPHOS 113 105  BILITOT 0.9 1.7*  PROT 7.9 7.6  ALBUMIN 3.9 3.7   No results for input(s): LIPASE, AMYLASE in the last 168 hours. No results for input(s): AMMONIA in the last 168 hours. CBC:  Recent Labs Lab 04/30/15 2206 05/01/15 0933 05/02/15 0645 05/03/15 0335  WBC 5.6 9.1 7.0 7.3  NEUTROABS 3.9  --   --   --   HGB 11.0* 11.0* 11.6* 9.7*  HCT 32.3* 32.8* 34.4* 28.8*  MCV 97.3 98.2 98.0 97.0  PLT 112* 102* 96* 80*   Cardiac Enzymes:  Recent Labs Lab 04/30/15 2206 05/01/15 0933 05/01/15 1401 05/01/15 2030   CKTOTAL 116  --   --   --   TROPONINI  --  0.87* 0.63* 0.42*   BNP: Invalid input(s): POCBNP CBG:  Recent Labs Lab 05/04/15 1107 05/04/15 1707 05/04/15 2210 05/05/15 0726 05/05/15 1159  GLUCAP 112* 79 89 72 76    Urine culture     Status: None   Collection Time: 05/01/15  5:29 AM  Result Value Ref Range Status   Specimen Description URINE, CATHETERIZED  Final   Special Requests NONE  Final   Culture   Final    MULTIPLE SPECIES PRESENT, SUGGEST RECOLLECTION Performed at [facility]    Report Status 05/02/2015 FINAL  Final  MRSA PCR Screening     Status: None   Collection Time: 05/01/15  4:07 PM  Result Value Ref Range Status   MRSA by PCR NEGATIVE NEGATIVE Final  C difficile quick scan w PCR reflex  Status: None   Collection Time: 05/02/15 11:15 AM  Result Value Ref Range Status   C Diff antigen NEGATIVE NEGATIVE Final   C Diff toxin NEGATIVE NEGATIVE Final   C Diff interpretation Negative for toxigenic C. difficile  Final     Scheduled Meds: . aspirin EC  81 mg Oral Daily  . atorvastatin  10 mg Oral q1800  . enoxaparin (LOVENOX) injection  40 mg Subcutaneous Q24H  . feeding supplement (ENSURE ENLIVE)  237 mL Oral BID BM  . folic acid  1 mg Oral Daily  . insulin aspart  0-5 Units Subcutaneous QHS  . insulin aspart  0-9 Units Subcutaneous TID WC  . lisinopril  20 mg Oral Daily  . metoprolol  75 mg Oral BID  . phosphorus  500 mg Oral BID  . tamsulosin  0.4 mg Oral Daily  . thiamine  100 mg Oral Daily   Continuous Infusions: . sodium chloride 10 mL/hr at 05/04/15 1137  . dextrose 5 % and 0.45% NaCl 40 mL/hr at 05/05/15 1311

## 2015-05-06 DIAGNOSIS — F10129 Alcohol abuse with intoxication, unspecified: Secondary | ICD-10-CM

## 2015-05-06 DIAGNOSIS — Z7189 Other specified counseling: Secondary | ICD-10-CM

## 2015-05-06 DIAGNOSIS — I480 Paroxysmal atrial fibrillation: Secondary | ICD-10-CM

## 2015-05-06 DIAGNOSIS — Z515 Encounter for palliative care: Secondary | ICD-10-CM | POA: Insufficient documentation

## 2015-05-06 DIAGNOSIS — E46 Unspecified protein-calorie malnutrition: Secondary | ICD-10-CM

## 2015-05-06 DIAGNOSIS — R7989 Other specified abnormal findings of blood chemistry: Secondary | ICD-10-CM

## 2015-05-06 DIAGNOSIS — F10929 Alcohol use, unspecified with intoxication, unspecified: Secondary | ICD-10-CM | POA: Insufficient documentation

## 2015-05-06 LAB — GLUCOSE, CAPILLARY
GLUCOSE-CAPILLARY: 84 mg/dL (ref 65–99)
GLUCOSE-CAPILLARY: 91 mg/dL (ref 65–99)
Glucose-Capillary: 103 mg/dL — ABNORMAL HIGH (ref 65–99)
Glucose-Capillary: 115 mg/dL — ABNORMAL HIGH (ref 65–99)

## 2015-05-06 LAB — CBC
HEMATOCRIT: 29.5 % — AB (ref 39.0–52.0)
Hemoglobin: 9.8 g/dL — ABNORMAL LOW (ref 13.0–17.0)
MCH: 32.3 pg (ref 26.0–34.0)
MCHC: 33.2 g/dL (ref 30.0–36.0)
MCV: 97.4 fL (ref 78.0–100.0)
Platelets: 97 10*3/uL — ABNORMAL LOW (ref 150–400)
RBC: 3.03 MIL/uL — AB (ref 4.22–5.81)
RDW: 14.3 % (ref 11.5–15.5)
WBC: 9.1 10*3/uL (ref 4.0–10.5)

## 2015-05-06 LAB — BASIC METABOLIC PANEL
ANION GAP: 11 (ref 5–15)
BUN: 12 mg/dL (ref 6–20)
CO2: 25 mmol/L (ref 22–32)
Calcium: 9.1 mg/dL (ref 8.9–10.3)
Chloride: 106 mmol/L (ref 101–111)
Creatinine, Ser: 0.79 mg/dL (ref 0.61–1.24)
GFR calc Af Amer: >60 mL/min (ref >60)
GFR calc non Af Amer: >60 mL/min (ref >60)
GLUCOSE: 100 mg/dL — AB (ref 65–99)
POTASSIUM: 3.6 mmol/L (ref 3.5–5.1)
Sodium: 142 mmol/L (ref 135–145)

## 2015-05-06 LAB — MAGNESIUM: Magnesium: 1.3 mg/dL — ABNORMAL LOW (ref 1.7–2.4)

## 2015-05-06 MED ORDER — HYDRALAZINE HCL 20 MG/ML IJ SOLN
10.0000 mg | Freq: Four times a day (QID) | INTRAMUSCULAR | Status: DC
  Administered 2015-05-06 – 2015-05-08 (×7): 10 mg via INTRAVENOUS
  Filled 2015-05-06: qty 0.5
  Filled 2015-05-06 (×2): qty 1
  Filled 2015-05-06 (×3): qty 0.5
  Filled 2015-05-06: qty 1
  Filled 2015-05-06 (×4): qty 0.5

## 2015-05-06 MED ORDER — HYDRALAZINE HCL 20 MG/ML IJ SOLN: 5.0000 mg | Freq: Four times a day (QID) | INTRAMUSCULAR | Status: DC | PRN

## 2015-05-06 MED ORDER — RESOURCE THICKENUP CLEAR PO POWD
ORAL | Status: DC | PRN
  Filled 2015-05-06: qty 125

## 2015-05-06 NOTE — Progress Notes (Signed)
Speech Language Pathology Treatment: Dysphagia  Patient Details Name: Johnny NewcomerDan E Leary MRN: [id number] DOB: 06-Jun-1934 Today's Date: 05/06/2015 Time: [id number] SLP Time Calculation (min) (ACUTE ONLY): 24 min  Assessment / Plan / Recommendation Clinical Impression  Pt demonstrates improved ability to swallow today; pt is fully alert, making jokes though still moderately dysarthric, suspect partially due to slightly hoarse vocal quality, dry oral mucosa and very poor dentition. Speech intelligibility improved after oral care and PO intake. Pt was able to consume 3 oz of thin liquids consecutively via straw x2 with no signs of aspiration; laryngeal elevation adequate, vocal quality dry. Also consumed 4 oz of nectar thick juice in a similar manner and 4 oz of puree. No multiple swallows or concern for residuals noted. Given very poor function over the past few days will initiate conservative diet of puree and nectar thick liquids and advance as tolerated or proceed with MBS if tolerance is poor.    HPI HPI: [age] [facility] with [facility]/weakness, going through ETOH w/d.  PMH + for respiratory failure requiring trach/vent, cardiac arrest, Gtube = removed May 2014, seizures.  Yesterday pt with copious coughing with intake and concern for aspiration.  Swallow evaluation ordered.  Pt was made NPO.        SLP Plan  Continue with current plan of care     Recommendations  Diet recommendations: Nectar-thick liquid;Dysphagia 1 (puree) Liquids provided via: Cup;Straw Medication Administration: Crushed with puree Supervision: Staff to assist with self feeding Compensations: Minimize environmental distractions;Slow rate;Small sips/bites Postural Changes and/or Swallow Maneuvers: Seated upright 90 degrees             Oral Care Recommendations: Oral care BID Plan: Continue with current plan of care     GO               [facility] Behavioral HospitalBonnie Hazeline Charnley, MA CCC-SLP [phone]  Claudine MoutonDeBlois, Tehya Leath Caroline 05/06/2015, 3:34  PM

## 2015-05-06 NOTE — Consult Note (Signed)
Consultation Note Date: 05/06/2015   Patient Name: Johnny [name]  DOB: 1935/02/06  MRN: [id number]  Age / Sex: 81 y.o., male  PCP: Gordy Savers, MD Referring Physician: Alison Murray, MD  Reason for Consultation: Establishing goals of care    Clinical Assessment/Narrative:  81 year old male with past medical history alcohol abuse, related alcohol hepatitis, atrial fibrillation, seizures likely related to alcohol abuse and withdrawal who presented to [facility] long hospital 04/30/2015 with weakness and fatigue. He was found by EMS covered in urine and feces while sitting on his wheelchair. He was placed in step down unit for monitoring for withdrawals.  During this hospitalization, the patient was treated for all call withdrawal/acute encephalopathy with thiamine and folic acid and see well protocol. Patient was also noted to have been in atrial fibrillation. He is on aspirin, rate controlled with metoprolol. Patient also has ejection fraction 40%, chronic systolic congestive heart failure likely a component of alcohol induced cardiomyopathy. Hospital course was also complicated by development of acute kidney injury that resolved with IV fluids resuscitation. Patient has underlying diabetes chronic obstructive pulmonary disease and hypertension. Patient has severe protein calorie malnutrition and generalized weakness due to history of alcohol use. He has been seen and evaluated by physical therapy with recommendations for skilled nursing facility placement. He has been seen and evaluated by speech therapy with recommendations for continuation of pured foods and nectar thick liquid consistencies.  Palliative care consultation for goals of care discussions. Patient is awake alert he is able to answer a few questions appropriately denies pain. Call placed and discussed with patient's daughter. CODE STATUS and goals of care  discussions undertaken. The patient's daughter states that as far as she has been told, the patient has made gradual progressive improvements during the course of this hospitalization. Introduced scope of palliative medicine with the daughter over the phone. Discussed about appropriateness of goal setting and goals concordant with care especially with serious conditions as mentioned above. CODE STATUS is full code at this time. Patient expected to be discharged likely to skilled nursing facility soon.  Contacts/Participants in Discussion: Primary Decision Maker:    Spouse, 2 daughters Relationship to Patient  family makes decisions as a units HCPOA: no     SUMMARY OF RECOMMENDATIONS: Full code, goals are for life maintaining/prolonging measures to continue. Agree with disposition of skilled nursing facility. Continue current scope treatment. Discussed with daughter over the phone about medical recommendation for DO NOT RESUSCITATE/DO NOT INTUBATE. She wishes to continue with full code for now as she believes this to be the patient's previously expressed wishes. However she will discuss amongst her mother and her other siblings.  Code Status/Advance Care Planning: Full code    Code Status Orders        Start     Ordered   05/01/15 0704  Full code   Continuous     05/01/15 0703    Code Status History    Date Active Date Inactive Code Status Order ID Comments User Context   12/29/2011  2:49 AM 01/05/2012  8:40 PM Full Code [id number]  Ron Parker, MD ED   04/29/2011 12:23 AM 05/03/2011  9:58 PM Full Code [id number]  Alger Simons, RN Inpatient    Advance Directive Documentation        Most Recent Value   Type of Advance Directive  Healthcare Power of Cindee Salt ]   Pre-existing out of facility DNR order (yellow form or pink MOST  form)     "MOST" Form in Place?        Other Directives:Other  Symptom Management:    as above   Palliative Prophylaxis:    Delirium Protocol  Additional Recommendations (Limitations, Scope, Preferences):  Full Scope Treatment     Psycho-social/Spiritual:  Support System: Fair Desire for further Chaplaincy support:no Additional Recommendations: Caregiving  Support/Resources  Prognosis: Unable to determine  Discharge Planning: SNF rehab versus home soon   Chief Complaint/ Primary Diagnoses: Present on Admission:  . Alcohol withdrawal (HCC) . Paroxysmal atrial fibrillation (HCC) . COPD (chronic obstructive pulmonary disease), presumed . Coronary atherosclerosis . Diabetes mellitus with peripheral vascular disease (HCC) . Essential hypertension . Hypomagnesemia . Protein calorie malnutrition (HCC) . Altered mental status  I have reviewed the medical record, interviewed the patient and family, and examined the patient. The following aspects are pertinent.  Past Medical History  Diagnosis Date  . Acute alcoholic hepatitis 05/20/2010  . Anemia of other chronic disease 08/16/2007  . CAD (coronary artery disease)     a. s/p CABG, notes unclear - 1998 or 2003?  Marland Kitchen Essential hypertension   . Hyperlipidemia   . ETOH abuse   . Chronic low back pain   . Pneumonia     "now; never before" (04/01/2012)  . Hallucination 04/01/2012  . Arthritis   . Altered mental status 04/01/2012  . Seizures (HCC) 04/01/2012  . Diabetes mellitus (HCC)   . History of stroke   . Paroxysmal atrial fibrillation (HCC)   . Paroxysmal atrial flutter (HCC)     a. s/p ablation 2007.  . Coagulopathy (HCC)   . Protein calorie malnutrition (HCC)   . PEA (Pulseless electrical activity) (HCC)     a. h/o PEA cardiac arrest 04/2012 (felt r/t PNA, acidosis, hypoxic resp failure in setting of alcohol withdrawal; required tracheostomy)   Social History   Social History  . Marital Status: Married    Spouse Name: N/A  . Number of Children: N/A  . Years of Education: N/A   Social History Main Topics  . Smoking status: Former  Smoker -- 0.50 packs/day for 17 years    Types: Cigarettes    Quit date: 04/22/1983  . Smokeless tobacco: Never Used     Comment: 04/01/2012 "quit smoking 20+ yr ago"  . Alcohol Use: 10.8 oz/week    2 Glasses of wine, 16 Shots of liquor per week     Comment: hx of ETOH abuse; 04/01/2012 "drink ~ 1 pint//wk; vodka; cup of wine/wk" 04/2015 40 oz wine per day  . Drug Use: No  . Sexual Activity: No   Other Topics Concern  . None   Social History Narrative   Family History  Problem Relation Age of Onset  . Heart failure Mother   . Aneurysm Sister   . Suicidality Brother   . Heart failure Brother   . Diabetes Sister    Scheduled Meds: . antiseptic oral rinse  7 mL Mouth Rinse BID  . aspirin EC  81 mg Oral Daily  . feeding supplement (ENSURE ENLIVE)  237 mL Oral BID BM  . folic acid  1 mg Intravenous Daily  . hydrALAZINE  10 mg Intravenous 4 times per day  . insulin aspart  0-5 Units Subcutaneous QHS  . insulin aspart  0-9 Units Subcutaneous TID WC  . metoprolol  5 mg Intravenous 4 times per day  . sodium chloride  3 mL Intravenous Q12H  . thiamine IV  100 mg Intravenous Daily  Continuous Infusions: . sodium chloride 10 mL/hr at 05/04/15 1137   PRN Meds:.acetaminophen **OR** acetaminophen, hydrALAZINE, ipratropium, levalbuterol, LORazepam, morphine injection, ondansetron **OR** ondansetron (ZOFRAN) IV, RESOURCE THICKENUP CLEAR Medications Prior to Admission:  Prior to Admission medications   Medication Sig Start Date End Date Taking? Authorizing Provider  ACCU-CHEK SOFTCLIX LANCETS lancets 1 each by Other route daily as needed for other. 06/20/13  Yes Gordy SaversPeter F Kwiatkowski, MD  aspirin 81 MG tablet Take 81 mg by mouth daily.   Yes Historical Provider, MD  glucose blood (ACCU-CHEK AVIVA PLUS) test strip 1 each by Other route daily as needed for other. 06/20/13  Yes Gordy SaversPeter F Kwiatkowski, MD  metoprolol (LOPRESSOR) 50 MG tablet TAKE 1.5 TABLETS (75 MG TOTAL) BY MOUTH 2 (TWO) TIMES DAILY.  04/19/15  Yes Gordy SaversPeter F Kwiatkowski, MD  Multiple Vitamin (MULTIVITAMIN) tablet Take 1 tablet by mouth daily.   Yes Historical Provider, MD  tamsulosin (FLOMAX) 0.4 MG CAPS capsule TAKE 1 CAPSULE (0.4 MG TOTAL) BY MOUTH DAILY. 12/26/14  Yes Gordy SaversPeter F Kwiatkowski, MD  atorvastatin (LIPITOR) 20 MG tablet TAKE 1 TABLET (20 MG TOTAL) BY MOUTH DAILY. Patient not taking: Reported on 05/01/2015 11/24/14   Gordy SaversPeter F Kwiatkowski, MD   No Known Allergies  Review of Systems Denies pain  Physical Exam Awake, answers few questions appropriately Generalized weakness evident no tremors Diminished breathing S1 S2 Abdomen soft mild distension Trace edema  Vital Signs: BP 149/44 mmHg  Pulse 92  Temp(Src) 98.2 F (36.8 C) (Oral)  Resp 18  Ht 5\' 11"  (1.803 m)  Wt 79.1 kg (174 lb 6.1 oz)  BMI 24.33 kg/m2  SpO2 100%  SpO2: SpO2: 100 % O2 Device:SpO2: 100 % O2 Flow Rate: .   IO: Intake/output summary:  Intake/Output Summary (Last 24 hours) at 05/06/15 1651 Last data filed at 05/06/15 1300  Gross per 24 hour  Intake 192.67 ml  Output   1050 ml  Net -857.33 ml    LBM: Last BM Date: 05/04/15 Baseline Weight: Weight: 80.1 kg (176 lb 9.4 oz) Most recent weight: Weight: 79.1 kg (174 lb 6.1 oz)      Palliative Assessment/Data:  Flowsheet Rows        Most Recent Value   Intake Tab    Referral Department  Hospitalist   Unit at Time of Referral  Med/Surg Unit   Palliative Care Primary Diagnosis  Other (Comment)   Palliative Care Type  New Palliative care   Reason for referral  Clarify Goals of Care   Date first seen by Palliative Care  05/06/15   Clinical Assessment    Palliative Performance Scale Score  30%   Pain Max last 24 hours  5   Pain Min Last 24 hours  4   Dyspnea Max Last 24 Hours  4   Dyspnea Min Last 24 hours  3   Psychosocial & Spiritual Assessment    Palliative Care Outcomes    Patient/Family meeting held?  Yes   Who was at the meeting?  daughter over the phone.    Palliative Care  follow-up planned  Yes, Facility      Additional Data Reviewed:  CBC:    Component Value Date/Time   WBC 9.1 05/06/2015 0526   HGB 9.8* 05/06/2015 0526   HCT 29.5* 05/06/2015 0526   PLT 97* 05/06/2015 0526   MCV 97.4 05/06/2015 0526   NEUTROABS 3.9 04/30/2015 2206   LYMPHSABS 1.2 04/30/2015 2206   MONOABS 0.4 04/30/2015 2206   EOSABS 0.1  04/30/2015 2206   BASOSABS 0.0 04/30/2015 2206   Comprehensive Metabolic Panel:    Component Value Date/Time   NA 142 05/06/2015 0526   K 3.6 05/06/2015 0526   CL 106 05/06/2015 0526   CO2 25 05/06/2015 0526   BUN 12 05/06/2015 0526   CREATININE 0.79 05/06/2015 0526   CREATININE 1.16 12/08/2014 1651   GLUCOSE 100* 05/06/2015 0526   GLUCOSE 93 04/23/2006 0000   CALCIUM 9.1 05/06/2015 0526   AST 38 05/01/2015 0933   ALT 23 05/01/2015 0933   ALKPHOS 105 05/01/2015 0933   BILITOT 1.7* 05/01/2015 0933   PROT 7.6 05/01/2015 0933   ALBUMIN 3.7 05/01/2015 0933     Time In: 1540 Time Out: 1640 Time Total: 60 Greater than 50%  of this time was spent counseling and coordinating care related to the above assessment and plan.  Signed by: Rosalin Hawking, MD [id number] Rosalin Hawking, MD  05/06/2015, 4:51 PM  Please contact Palliative Medicine Team phone at [phone] for questions and concerns.

## 2015-05-06 NOTE — Progress Notes (Addendum)
Patient ID: Johnny [name], male   DOB: [birth year], 81 y.o.   MRN: [id number] [facility] PROGRESS NOTE  Johnny [name] [id number] DOB: [birth year] DOA: 04/30/2015 PCP: Rogelia BogaKWIATKOWSKI,PETER FRANK, MD  Brief narrative:    81 year old male with past medical history alcohol abuse, related alcohol hepatitis, atrial fibrillation, seizures likely related to alcohol abuse and withdrawal who [facility] with weakness and fatigue. He was found by EMS covered in urine and feces while sitting on his wheelchair. He was placed in step down unit for monitoring for withdrawals.  Transferred to telemetry 05/04/2015.  Assessment/Plan:    Principal Problem: Alcohol withdrawal / Acute encephalopathy  - Continue with CIWA protocol.  - Continue with thiamine and folic acid.  - Still lethargic but able to wake up when his name is called   Active Problems: Atrial fibrillation - CHADS vasc score 4 (age, gender, hypertension, CHF) - Continue aspirin.  - Rate controlled with metoprolol - Cardiology has seen the patient in consultation and now signed off with no further recommendations other than to continue present management.  Lactic acidosis - Related to history of alcohol abuse - Lactic acid has improved since the admission - No acute findings on chest x-ray. No sign of acute infectious process.  - C. difficile negative.  Elevated troponin level / Coronary atherosclerosis stable, native artery, without angina pectoris - Likely demand ischemia from alcohol withdrawal - 2-D echo on this admission showed ejection fraction of 40%  Chronic Systolic Congestive Heart failure / alcohol induced cardiomyopathy / acute respiratory failure with hypoxia - 2-D echo on this admission showed ejection fraction of 40% - Likely related to alcohol-induced cardiomyopathy - Continue metoprolol  Acute kidney injury - Perhaps related to dehydration from alcohol abuse, withdrawal - Cr now  WNL with IV fluids   Thrombocytopenia - Likely secondary to bone marrow suppression from alcohol abuse - Platelet count is 80 - We stopped Lovenox subQ 1/13 - Check CBC in am  COPD (chronic obstructive pulmonary disease) - Xopenex and atrovent as needed for shortness of breath or wheezing  - Stable respiratory status   Diabetes mellitus with peripheral vascular disease without long-term insulin use (HCC)  - Continue SSI  Essential hypertension - Continue metoprolol IV and hydralazine IV  Hypomagnesemia / Hypophosphatemia - Due to alcohol abuse - Supplemented  - Check BMP and magnesium in am   Severe protein calorie malnutrition (HCC) - Due to lethargy, acute illness  Generalized weakness  - Due to history of alcohol abuse - CT head was negative  - PT eval - recommendation for SNF placement  DVT prophylaxis - SCD's because of thrombocytopenia    Code Status: Full.  Family Communication: family not at the bedside this am Disposition Plan: SNF or home by 1/16 or 1/17 if mental status better   IV access:  Peripheral IV  Procedures and diagnostic studies:    Dg Chest 2 View 04/30/2015  No active cardiopulmonary disease. Electronically Signed   By: Lupita RaiderJames  Green Jr, M.D.   On: 04/30/2015 22:54   Ct Head Wo Contrast 05/01/2015 No acute intracranial hemorrhage. Age-related atrophy and chronic microvascular ischemic disease. If symptoms persist and there are no contraindications, MRI may provide better evaluation if clinically indicated. Electronically Signed   By: Elgie CollardArash  Radparvar M.D.   On: 05/01/2015 02:56   Dg Chest Port 1 View 05/01/2015  No active disease. Electronically Signed   By: Charlett NoseKevin  Dover M.D.   On: 05/01/2015  11:20   Medical Consultants:  Cardiology  Other Consultants:  Physical therapy  IAnti-Infectives:   None    Manson Passey, MD  [facility] Pager [phone]  Time spent in minutes: 25 minutes  If 7PM-7AM, please contact  night-coverage [email or web address] Password [email or web address] 05/06/2015, 1:30 PM   LOS: 5 days    HPI/Subjective: No acute overnight events. Better mental status.   Objective: Filed Vitals:   05/05/15 1855 05/05/15 2240 05/06/15 0541 05/06/15 1142  BP: 150/63 177/76 168/85 171/84  Pulse: 135 109 103 95  Temp:  98.5 F (36.9 C) 98.1 F (36.7 C)   TempSrc:  Oral Oral   Resp:  19 18 20   Height:      Weight:      SpO2:  100% 98% 100%    Intake/Output Summary (Last 24 hours) at 05/06/15 1330 Last data filed at 05/06/15 0800  Gross per 24 hour  Intake 192.67 ml  Output   1050 ml  Net -857.33 ml    Exam:   General:  Pt is more alert, no distress   Cardiovascular: rate controlled, (+) S1, S2   Respiratory: bilateral air entry, no wheezing   Abdomen: (+) BS, non tender   Extremities: No swelling, palpable pulses   Neuro: No focal deficits   Data Reviewed: Basic Metabolic Panel:  Recent Labs Lab 04/30/15 2206 05/01/15 0933 05/02/15 0645 05/03/15 0335 05/06/15 0526  NA 144 143 135 133* 142  K 4.4 3.8 3.5 3.1* 3.6  CL 109 107 101 100* 106  CO2 24 21* 26 23 25   GLUCOSE 167* 138* 129* 169* 100*  BUN 20 13 11  22* 12  CREATININE 1.00 0.84 0.68 1.04 0.79  CALCIUM 9.1 8.9 8.6* 8.3* 9.1  MG 1.6* 1.7  --   --  1.3*  PHOS  --  2.1* 2.2* 2.4*  --    Liver Function Tests:  Recent Labs Lab 04/30/15 2206 05/01/15 0933  AST 34 38  ALT 24 23  ALKPHOS 113 105  BILITOT 0.9 1.7*  PROT 7.9 7.6  ALBUMIN 3.9 3.7   No results for input(s): LIPASE, AMYLASE in the last 168 hours. No results for input(s): AMMONIA in the last 168 hours. CBC:  Recent Labs Lab 04/30/15 2206 05/01/15 0933 05/02/15 0645 05/03/15 0335 05/06/15 0526  WBC 5.6 9.1 7.0 7.3 9.1  NEUTROABS 3.9  --   --   --   --   HGB 11.0* 11.0* 11.6* 9.7* 9.8*  HCT 32.3* 32.8* 34.4* 28.8* 29.5*  MCV 97.3 98.2 98.0 97.0 97.4  PLT 112* 102* 96* 80* 97*   Cardiac Enzymes:  Recent Labs Lab 04/30/15 2206  05/01/15 0933 05/01/15 1401 05/01/15 2030  CKTOTAL 116  --   --   --   TROPONINI  --  0.87* 0.63* 0.42*   BNP: Invalid input(s): POCBNP CBG:  Recent Labs Lab 05/05/15 1159 05/05/15 1555 05/05/15 2130 05/06/15 0740 05/06/15 1158  GLUCAP 76 102* 93 103* 84    Urine culture     Status: None   Collection Time: 05/01/15  5:29 AM  Result Value Ref Range Status   Specimen Description URINE, CATHETERIZED  Final   Special Requests NONE  Final   Culture   Final    MULTIPLE SPECIES PRESENT, SUGGEST RECOLLECTION Performed at [facility]    Report Status 05/02/2015 FINAL  Final  MRSA PCR Screening     Status: None   Collection Time: 05/01/15  4:07 PM  Result Value Ref Range  Status   MRSA by PCR NEGATIVE NEGATIVE Final  C difficile quick scan w PCR reflex     Status: None   Collection Time: 05/02/15 11:15 AM  Result Value Ref Range Status   C Diff antigen NEGATIVE NEGATIVE Final   C Diff toxin NEGATIVE NEGATIVE Final   C Diff interpretation Negative for toxigenic C. difficile  Final     Scheduled Meds: . aspirin EC  81 mg Oral Daily  . atorvastatin  10 mg Oral q1800  . enoxaparin (LOVENOX) injection  40 mg Subcutaneous Q24H  . feeding supplement (ENSURE ENLIVE)  237 mL Oral BID BM  . folic acid  1 mg Oral Daily  . insulin aspart  0-5 Units Subcutaneous QHS  . insulin aspart  0-9 Units Subcutaneous TID WC  . lisinopril  20 mg Oral Daily  . metoprolol  75 mg Oral BID  . phosphorus  500 mg Oral BID  . tamsulosin  0.4 mg Oral Daily  . thiamine  100 mg Oral Daily   Continuous Infusions: . sodium chloride 10 mL/hr at 05/04/15 1137

## 2015-05-07 ENCOUNTER — Inpatient Hospital Stay (HOSPITAL_COMMUNITY): Payer: Commercial Managed Care - HMO

## 2015-05-07 DIAGNOSIS — G9340 Encephalopathy, unspecified: Secondary | ICD-10-CM | POA: Insufficient documentation

## 2015-05-07 DIAGNOSIS — M25531 Pain in right wrist: Secondary | ICD-10-CM | POA: Insufficient documentation

## 2015-05-07 DIAGNOSIS — J449 Chronic obstructive pulmonary disease, unspecified: Secondary | ICD-10-CM

## 2015-05-07 LAB — CBC
HEMATOCRIT: 28.6 % — AB (ref 39.0–52.0)
HEMOGLOBIN: 9.7 g/dL — AB (ref 13.0–17.0)
MCH: 33.1 pg (ref 26.0–34.0)
MCHC: 33.9 g/dL (ref 30.0–36.0)
MCV: 97.6 fL (ref 78.0–100.0)
Platelets: 105 10*3/uL — ABNORMAL LOW (ref 150–400)
RBC: 2.93 MIL/uL — AB (ref 4.22–5.81)
RDW: 14.3 % (ref 11.5–15.5)
WBC: 7.6 10*3/uL (ref 4.0–10.5)

## 2015-05-07 LAB — BASIC METABOLIC PANEL
ANION GAP: 10 (ref 5–15)
BUN: 17 mg/dL (ref 6–20)
CALCIUM: 8.7 mg/dL — AB (ref 8.9–10.3)
CHLORIDE: 105 mmol/L (ref 101–111)
CO2: 25 mmol/L (ref 22–32)
Creatinine, Ser: 0.73 mg/dL (ref 0.61–1.24)
GFR calc non Af Amer: >60 mL/min (ref >60)
Glucose, Bld: 158 mg/dL — ABNORMAL HIGH (ref 65–99)
POTASSIUM: 3.4 mmol/L — AB (ref 3.5–5.1)
Sodium: 140 mmol/L (ref 135–145)

## 2015-05-07 LAB — GLUCOSE, CAPILLARY
GLUCOSE-CAPILLARY: 148 mg/dL — AB (ref 65–99)
GLUCOSE-CAPILLARY: 171 mg/dL — AB (ref 65–99)
GLUCOSE-CAPILLARY: 146 mg/dL — AB (ref 65–99)
Glucose-Capillary: 115 mg/dL — ABNORMAL HIGH (ref 65–99)

## 2015-05-07 NOTE — Progress Notes (Signed)
SLP visit with pt, Pt with improved mentation and subsequently swallowing ability - recommend advance diet to dys2/thin with strict precautions.  Pt reports swallowing ability to be at baseline.    RN informed, full report to follow.  Donavan Burnetamara Kathryne Ramella, MS [facility] SLP [phone]

## 2015-05-07 NOTE — Progress Notes (Signed)
Patient ID: Johnny [name] Yon, male   DOB: 12-23-1934, 81 y.o.   MRN: [id number] [facility] PROGRESS NOTE  Johnny [name] Montante WGN:[id number] DOB: 12-23-1934 DOA: 04/30/2015 PCP: Rogelia BogaKWIATKOWSKI,PETER FRANK, MD  Brief narrative:    81 year old male with past medical history alcohol abuse, related alcohol hepatitis, atrial fibrillation, seizures likely related to alcohol abuse and withdrawal who [facility] with weakness and fatigue. He was found by EMS covered in urine and feces while sitting on his wheelchair. He was placed in step down unit for monitoring for withdrawals.  Transferred to telemetry 05/04/2015.  Assessment/Plan:    Principal Problem: Alcohol withdrawal / Acute encephalopathy  - Continue with CIWA protocol.  - Continue with thiamine and folic acid.  - Monitor mental status   Active Problems: Atrial fibrillation - CHADS vasc score 4 (age, gender, hypertension, CHF) - Continue aspirin.  - Rate controlled with metoprolol - Cardiology has seen the patient in consultation and now signed off with no further recommendations other than to continue present management.  Lactic acidosis - Related to history of alcohol abuse - Lactic acid has improved since the admission - No acute findings on chest x-ray. No sign of acute infectious process.  - C. difficile negative.  Elevated troponin level / Coronary atherosclerosis stable, native artery, without angina pectoris - Likely demand ischemia from alcohol withdrawal - 2-D echo on this admission showed ejection fraction of 40%  Chronic Systolic Congestive Heart failure / alcohol induced cardiomyopathy / acute respiratory failure with hypoxia - 2-D echo on this admission showed ejection fraction of 40% - Likely related to alcohol-induced cardiomyopathy - Continue metoprolol  Acute kidney injury - Perhaps related to dehydration from alcohol abuse, withdrawal - Cr normalized with fluids    Thrombocytopenia - Likely secondary to bone marrow suppression from alcohol abuse - Stopped Lovenox - Using SCD's for DVT prophylaxis   COPD (chronic obstructive pulmonary disease) - Xopenex and atrovent as needed for shortness of breath or wheezing   Diabetes mellitus with peripheral vascular disease without long-term insulin use (HCC)  - Continue SSI - CBG's in past 24 hours: 91, 115, 115  Essential hypertension - Continue metoprolol IV and hydralazine IV until better mental status   Hypomagnesemia / Hypophosphatemia - Due to alcohol abuse - Supplemented   Severe protein calorie malnutrition (HCC) - In the setting of acute illness  Generalized weakness  - Due to history of alcohol abuse - CT head was negative  - PT eval - recommendation for SNF placement  DVT prophylaxis - SCD's    Code Status: Full.  Family Communication: family not at the bedside this am Disposition Plan: SNF or home by 1/17 if mental status better   IV access:  Peripheral IV  Procedures and diagnostic studies:    Dg Chest 2 View 04/30/2015  No active cardiopulmonary disease. Electronically Signed   By: Lupita RaiderJames  Green Jr, M.D.   On: 04/30/2015 22:54   Ct Head Wo Contrast 05/01/2015 No acute intracranial hemorrhage. Age-related atrophy and chronic microvascular ischemic disease. If symptoms persist and there are no contraindications, MRI may provide better evaluation if clinically indicated. Electronically Signed   By: Elgie CollardArash  Radparvar M.D.   On: 05/01/2015 02:56   Dg Chest Port 1 View 05/01/2015  No active disease. Electronically Signed   By: Charlett NoseKevin  Dover M.D.   On: 05/01/2015 11:20   Medical Consultants:  Cardiology  Other Consultants:  Physical therapy  IAnti-Infectives:   None  Manson Passey, MD  [facility] Pager [phone]  Time spent in minutes: 15 minutes  If 7PM-7AM, please contact night-coverage [email or web address] Password [facility] 05/07/2015, 11:34 AM   LOS: 6 days     HPI/Subjective: No acute overnight events. No agitation.   Objective: Filed Vitals:   05/06/15 1500 05/06/15 1711 05/06/15 2238 05/07/15 0646  BP: 149/44 152/72 174/50 165/51  Pulse: 92 99 96 95  Temp: 98.2 F (36.8 C)  99.7 F (37.6 C) 98.7 F (37.1 C)  TempSrc: Oral  Oral Oral  Resp: 18 20 21 19   Height:      Weight:      SpO2: 100% 98% 100% 100%    Intake/Output Summary (Last 24 hours) at 05/07/15 1134 Last data filed at 05/07/15 0955  Gross per 24 hour  Intake    600 ml  Output    600 ml  Net      0 ml    Exam:   General:  Pt is not in distress, better mental status   Cardiovascular: RRR, (+) S1, S2   Respiratory: clear to auscultation bilaterally, no wheezing   Abdomen: non tender, non distended, (+) BS  Extremities: No edema, palpable pulses  Neuro: Non focal    Data Reviewed: Basic Metabolic Panel:  Recent Labs Lab 04/30/15 2206 05/01/15 0933 05/02/15 0645 05/03/15 0335 05/06/15 0526 05/07/15 0525  NA 144 143 135 133* 142 140  K 4.4 3.8 3.5 3.1* 3.6 3.4*  CL 109 107 101 100* 106 105  CO2 24 21* 26 23 25 25   GLUCOSE 167* 138* 129* 169* 100* 158*  BUN 20 13 11  22* 12 17  CREATININE 1.00 0.84 0.68 1.04 0.79 0.73  CALCIUM 9.1 8.9 8.6* 8.3* 9.1 8.7*  MG 1.6* 1.7  --   --  1.3*  --   PHOS  --  2.1* 2.2* 2.4*  --   --    Liver Function Tests:  Recent Labs Lab 04/30/15 2206 05/01/15 0933  AST 34 38  ALT 24 23  ALKPHOS 113 105  BILITOT 0.9 1.7*  PROT 7.9 7.6  ALBUMIN 3.9 3.7   No results for input(s): LIPASE, AMYLASE in the last 168 hours. No results for input(s): AMMONIA in the last 168 hours. CBC:  Recent Labs Lab 04/30/15 2206 05/01/15 0933 05/02/15 0645 05/03/15 0335 05/06/15 0526 05/07/15 0525  WBC 5.6 9.1 7.0 7.3 9.1 7.6  NEUTROABS 3.9  --   --   --   --   --   HGB 11.0* 11.0* 11.6* 9.7* 9.8* 9.7*  HCT 32.3* 32.8* 34.4* 28.8* 29.5* 28.6*  MCV 97.3 98.2 98.0 97.0 97.4 97.6  PLT 112* 102* 96* 80* 97* 105*    Cardiac Enzymes:  Recent Labs Lab 04/30/15 2206 05/01/15 0933 05/01/15 1401 05/01/15 2030  CKTOTAL 116  --   --   --   TROPONINI  --  0.87* 0.63* 0.42*   BNP: Invalid input(s): POCBNP CBG:  Recent Labs Lab 05/06/15 0740 05/06/15 1158 05/06/15 1624 05/06/15 2216 05/07/15 0742  GLUCAP 103* 84 91 115* 115*    Urine culture     Status: None   Collection Time: 05/01/15  5:29 AM  Result Value Ref Range Status   Specimen Description URINE, CATHETERIZED  Final   Special Requests NONE  Final   Culture   Final    MULTIPLE SPECIES PRESENT, SUGGEST RECOLLECTION Performed at [facility]    Report Status 05/02/2015 FINAL  Final  MRSA PCR Screening  Status: None   Collection Time: 05/01/15  4:07 PM  Result Value Ref Range Status   MRSA by PCR NEGATIVE NEGATIVE Final  C difficile quick scan w PCR reflex     Status: None   Collection Time: 05/02/15 11:15 AM  Result Value Ref Range Status   C Diff antigen NEGATIVE NEGATIVE Final   C Diff toxin NEGATIVE NEGATIVE Final   C Diff interpretation Negative for toxigenic C. difficile  Final     Scheduled Meds: . aspirin EC  81 mg Oral Daily  . atorvastatin  10 mg Oral q1800  . enoxaparin (LOVENOX) injection  40 mg Subcutaneous Q24H  . feeding supplement (ENSURE ENLIVE)  237 mL Oral BID BM  . folic acid  1 mg Oral Daily  . insulin aspart  0-5 Units Subcutaneous QHS  . insulin aspart  0-9 Units Subcutaneous TID WC  . lisinopril  20 mg Oral Daily  . metoprolol  75 mg Oral BID  . phosphorus  500 mg Oral BID  . tamsulosin  0.4 mg Oral Daily  . thiamine  100 mg Oral Daily   Continuous Infusions: . sodium chloride 10 mL/hr at 05/04/15 1137

## 2015-05-07 NOTE — Progress Notes (Signed)
Physical Therapy Treatment Patient Details Name: Johnny [name] MRN: [id number] DOB: 09-10-1934 Today's Date: 05/07/2015    History of Present Illness pt was admitted with weakness; ETOH withdrawal.  PMH significant for DM, HTN, CAD, CABG, CVA with L weakness    PT Comments    Pt slow to respond to commands.  [facility] small BM required + 2 assist.  Pt unable to stand and support his own weight.  Attempted amb using B platform EVA walker, however pt was unable to advance either LE and unable to stand erect.  BCS and recliner switched out from behind.  Positioned in recliner with Johnson & JohnsonHoyer Pad in place for nursing to use lift to assist pt back to bed.   Follow Up Recommendations  SNF     Equipment Recommendations  None recommended by PT    Recommendations for Other Services       Precautions / Restrictions Precautions Precautions: Fall Precaution Comments: incontinence, wears diabetic shoes Restrictions Weight Bearing Restrictions: No    Mobility  Bed Mobility Overal bed mobility: Needs Assistance;+2 for physical assistance;+ 2 for safety/equipment Bed Mobility: Supine to Sit     Supine to sit: Mod assist;+2 for physical assistance;+2 for safety/equipment     General bed mobility comments: assist for trunk and legs plus increased time to respond to repeated commands.  Transfers Overall transfer level: Needs assistance Equipment used: None Transfers: Sit to/from [employer] CorporationStand;Stand Pivot Transfers Sit to Stand: Total assist;+2 physical assistance;+2 safety/equipment         General transfer comment: elevated bed to [facility] total assist + 2 pt 15%.    Ambulation/Gait Ambulation/Gait assistance: +2 physical assistance;+2 safety/equipment;Max assist Ambulation Distance (Feet): 0 Feet Assistive device: Bilateral platform walker (EVA walker)       General Gait Details: attempted amb using B platform EVA walker however pt was unable to functionally weight  shift and unable to take any steps.  Poor posture with B hips and knee flex.    Stairs            Wheelchair Mobility    Modified Rankin (Stroke Patients Only)       Balance                                    Cognition   Behavior During Therapy:  (groggy)                   General Comments: following commands/slow to respond    Exercises      General Comments        Pertinent Vitals/Pain Pain Assessment: No/denies pain    Home Living                      Prior Function            PT Goals (current goals can now be found in the care plan section) Progress towards PT goals: Progressing toward goals    Frequency  Min 3X/week    PT Plan      Co-evaluation             End of Session Equipment Utilized During Treatment: Gait belt Activity Tolerance: Treatment limited secondary to medical complications (Comment) Patient left: in chair;with call bell/phone within reach     Time: 1435-1500 PT Time Calculation (min) (ACUTE ONLY): 25 min  Charges:  $  Therapeutic Activity: 23-37 mins                    G Codes:      Rica Koyanagi  PTA [facility]  Acute  Rehab Pager      [phone]

## 2015-05-07 NOTE — Care Management Important Message (Signed)
Important Message  Patient Details  Name: Johnny [name] MRN: [id number] Date of Birth: [birth year]   Medicare Important Message Given:  Yes    Haskell FlirtJamison, Louanne Calvillo 05/07/2015, 11:14 AMImportant Message  Patient Details  Name: Johnny [name] MRN: [id number] Date of Birth: [birth year]   Medicare Important Message Given:  Yes    Haskell FlirtJamison, Ellah Otte 05/07/2015, 11:13 AM

## 2015-05-07 NOTE — Progress Notes (Signed)
Daily Progress Note   Patient Name: Johnny [name]       Date: 05/07/2015 DOB: [birth year]  Age: 81 y.o. MRN#: [id number] Attending Physician: Alison Murray, MD Primary Care Physician: Rogelia Boga, MD Admit Date: 04/30/2015  Reason for Consultation/Follow-up: Establishing goals of care  Subjective: Awake alert complains of pain in his R wrist, swelling wrist and forearm, tender to touch    check Xray r wrist joint, discussed with bedside RN about prn pain medications, patient has prn tylenol and morphine iv prn Goals of care: patient states he is aware that he was acutely ill earlier this hospitalization, he wishes to give up drinking, he endorses that he wants to get better and live.  Anticipate d/c soon Full code, with life prolonging/life maintaining measures as discussed with patient this am No family at bedside  Length of Stay: 6 days  Current Medications: Scheduled Meds:  . antiseptic oral rinse  7 mL Mouth Rinse BID  . aspirin EC  81 mg Oral Daily  . feeding supplement (ENSURE ENLIVE)  237 mL Oral BID BM  . folic acid  1 mg Intravenous Daily  . hydrALAZINE  10 mg Intravenous 4 times per day  . insulin aspart  0-5 Units Subcutaneous QHS  . insulin aspart  0-9 Units Subcutaneous TID WC  . metoprolol  5 mg Intravenous 4 times per day  . sodium chloride  3 mL Intravenous Q12H  . thiamine IV  100 mg Intravenous Daily    Continuous Infusions: . sodium chloride 10 mL/hr at 05/04/15 1137    PRN Meds: acetaminophen **OR** acetaminophen, hydrALAZINE, ipratropium, levalbuterol, LORazepam, morphine injection, ondansetron **OR** ondansetron (ZOFRAN) IV, RESOURCE THICKENUP CLEAR  Physical Exam: Physical Exam             Awake alert Pain in R wrist Clear S1 S2 Abdomen  mild distension Trace edema Awake alert, answers questions appropriately, in no distress  Vital Signs: BP 165/51 mmHg  Pulse 95  Temp(Src) 98.7 F (37.1 C) (Oral)  Resp 19  Ht 5\' 11"  (1.803 m)  Wt 79.1 kg (174 lb 6.1 oz)  BMI 24.33 kg/m2  SpO2 100% SpO2: SpO2: 100 % O2 Device: O2 Device: Not Delivered O2 Flow Rate:    Intake/output summary:  Intake/Output Summary (Last 24 hours) at 05/07/15 1055 Last  data filed at 05/07/15 0955  Gross per 24 hour  Intake    600 ml  Output    600 ml  Net      0 ml   LBM: Last BM Date: 05/04/15 Baseline Weight: Weight: 80.1 kg (176 lb 9.4 oz) Most recent weight: Weight: 79.1 kg (174 lb 6.1 oz)       Palliative Assessment/Data: Flowsheet Rows        Most Recent Value   Intake Tab    Referral Department  Hospitalist   Unit at Time of Referral  Med/Surg Unit   Palliative Care Primary Diagnosis  Other (Comment)   Palliative Care Type  Return patient Palliative Care   Reason for referral  Clarify Goals of Care   Date first seen by Palliative Care  05/06/15   Clinical Assessment    Palliative Performance Scale Score  40%   Pain Max last 24 hours  8   Pain Min Last 24 hours  7   Dyspnea Max Last 24 Hours  4   Dyspnea Min Last 24 hours  3   Psychosocial & Spiritual Assessment    Palliative Care Outcomes    Patient/Family meeting held?  No   Who was at the meeting?  daughter over the phone.    Palliative Care follow-up planned  Yes, Facility      Additional Data Reviewed: CBC    Component Value Date/Time   WBC 7.6 05/07/2015 0525   RBC 2.93* 05/07/2015 0525   RBC 2.50* 04/19/2012 1229   HGB 9.7* 05/07/2015 0525   HCT 28.6* 05/07/2015 0525   PLT 105* 05/07/2015 0525   MCV 97.6 05/07/2015 0525   MCH 33.1 05/07/2015 0525   MCHC 33.9 05/07/2015 0525   RDW 14.3 05/07/2015 0525   LYMPHSABS 1.2 04/30/2015 2206   MONOABS 0.4 04/30/2015 2206   EOSABS 0.1 04/30/2015 2206   BASOSABS 0.0 04/30/2015 2206    CMP     Component Value  Date/Time   NA 140 05/07/2015 0525   K 3.4* 05/07/2015 0525   CL 105 05/07/2015 0525   CO2 25 05/07/2015 0525   GLUCOSE 158* 05/07/2015 0525   GLUCOSE 93 04/23/2006 0000   BUN 17 05/07/2015 0525   CREATININE 0.73 05/07/2015 0525   CREATININE 1.16 12/08/2014 1651   CALCIUM 8.7* 05/07/2015 0525   PROT 7.6 05/01/2015 0933   ALBUMIN 3.7 05/01/2015 0933   AST 38 05/01/2015 0933   ALT 23 05/01/2015 0933   ALKPHOS 105 05/01/2015 0933   BILITOT 1.7* 05/01/2015 0933   GFRNONAA >60 05/07/2015 0525   GFRAA >60 05/07/2015 0525       Problem List:  Patient Active Problem List   Diagnosis Date Noted  . Alcohol intoxication (HCC)   . Encounter for palliative care   . Goals of care, counseling/discussion   . Alcoholic cardiomyopathy (HCC) 16/10/960401/02/2016  . Weakness 05/01/2015  . Altered mental status 05/01/2015  . Sinus tachycardia (HCC)   . Debility   . Leg edema, left   . COPD (chronic obstructive pulmonary disease), presumed 04/26/2012  . Failure to wean from mechanical ventilation (HCC) 04/26/2012  . Hypervolemia 04/26/2012  . Electrolyte and fluid disorder 04/26/2012  . Protein calorie malnutrition (HCC) 04/26/2012  . Poor dentition 04/26/2012  . UTI (lower urinary tract infection) resolved.  04/26/2012  . Atrial flutter (HCC) 04/13/2012  . Tracheostomy dependence (HCC) 04/11/2012  . Protein-calorie malnutrition, severe (HCC) 04/11/2012  . Acute respiratory failure with hypoxia, resolved 04/02/2012  .  s/p PEA Cardiac arrest: Likely related to pneumonia, acidosis, hypoxic respiratory failure in setting of ETOH withdrawal 04/02/2012  . Septic shock(785.52) in setting of pneumonia (resolved) 04/02/2012  . Anoxic Encephalopathy: due to hypoxia after cardiac arrest.  04/02/2012  . HCAP (healthcare-associated pneumonia) -->resolved 04/01/2012  . Alcohol dependence s/p withdrawl  04/01/2012  . acute renal failure (resolved): Baseline creatinine 0.95 from 01/05/12.  04/01/2012  . SIRS  (systemic inflammatory response syndrome) (HCC) 12/30/2011  . Hypomagnesemia 12/30/2011  . Weakness generalized 12/29/2011  . Alcohol withdrawal (HCC) 12/29/2011  . Hypertensive urgency 12/29/2011  . Tachycardia 04/28/2011  . Lower extremity weakness 04/28/2011  . ACUTE ALCOHOLIC HEPATITIS 05/20/2010  . Anemia of other chronic disease 08/16/2007  . Paroxysmal atrial fibrillation (HCC) 08/12/2007  . Diabetes mellitus with peripheral vascular disease (HCC) 12/11/2006  . Essential hypertension 12/11/2006  . Coronary atherosclerosis 12/11/2006     Palliative Care Assessment & Plan    1.Code Status:  Full code    Code Status Orders        Start     Ordered   05/01/15 0704  Full code   Continuous     05/01/15 0703    Code Status History    Date Active Date Inactive Code Status Order ID Comments User Context   12/29/2011  2:49 AM 01/05/2012  8:40 PM Full Code [id number]  Ron Parker, MD ED   04/29/2011 12:23 AM 05/03/2011  9:58 PM Full Code [id number]  Alger Simons, RN Inpatient    Advance Directive Documentation        Most Recent Value   Type of Advance Directive  Healthcare Power of Cindee Salt ]   Pre-existing out of facility DNR order (yellow form or pink MOST form)     "MOST" Form in Place?         2. Goals of Care/Additional Recommendations:   as above  Limitations on Scope of Treatment: Full Scope Treatment  Desire for further Chaplaincy support:no  Psycho-social Needs: Caregiving  Support/Resources  3. Symptom Management:      1. As above  4. Palliative Prophylaxis:   Delirium Protocol  5. Prognosis: Unable to determine  6. Discharge Planning:   likely to home versus SNF soon   Care plan was discussed with  Patient, RN  Thank you for allowing the Palliative Medicine Team to assist in the care of this patient.   Time In: 1030 Time Out: 1055 Total Time 25 Prolonged Time Billed  no        [phone] Rosalin Hawking, MD    05/07/2015, 10:55 AM  Please contact Palliative Medicine Team phone at [phone] for questions and concerns.

## 2015-05-07 NOTE — Progress Notes (Signed)
Speech Language Pathology Treatment: Dysphagia  Patient Details Name: Johnny NewcomerDan E [name] MRN: [id number] DOB: [birth year] Today's Date: 05/07/2015 Time: 1235-1300 SLP Time Calculation (min) (ACUTE ONLY): 25 min  Assessment / Plan / Recommendation Clinical Impression  Pt with improved mentation and subsequently swallowing ability today.  He admits to occasional issues with coughing on food more than liquids prior to admission.  Speech is clearer and pt with strong voice.  Observed pt consume solids and thin liquids, functional  Mastication with no oral residuals.  Delayed cough x1 noted - pt denies coorelated to po intake or sening food "going down the wrong way".  Recommend to advance diet to dys2/thin with full supervision.  Hopefully pt's intake may improve with more palatable foods.  Pt CXR has been negative since admission = suspect tolerance of intake will be adequate now that pt's mental status is improving.  Informed RN and posted new signs.   Pt agreeable to plan and he reports his swallowing ability to be at baseline. SLP  To follow briefly for dysphagia management.    HPI HPI: 81 [facility] with fatigue/weakness, going through ETOH w/d.  PMH + for respiratory failure requiring trach/vent, cardiac arrest, Gtube = removed May 2014, seizures.  Yesterday pt with copious coughing with intake and concern for aspiration.  Swallow evaluation ordered.  Pt was made NPO.        SLP Plan  Continue with current plan of care     Recommendations  Diet recommendations: Thin liquid;Dysphagia 3 (mechanical soft) Liquids provided via: Cup;Straw Medication Administration: Whole meds with puree Supervision: Staff to assist with self feeding Compensations: Minimize environmental distractions;Slow rate;Small sips/bites Postural Changes and/or Swallow Maneuvers: Seated upright 90 degrees             Oral Care Recommendations: Oral care BID Plan: Continue with current plan of care     GO                 Johnny Burnetamara [name], MS [facility] SLP [phone]

## 2015-05-08 ENCOUNTER — Other Ambulatory Visit: Payer: Self-pay | Admitting: *Deleted

## 2015-05-08 DIAGNOSIS — M79641 Pain in right hand: Secondary | ICD-10-CM | POA: Diagnosis not present

## 2015-05-08 DIAGNOSIS — I2510 Atherosclerotic heart disease of native coronary artery without angina pectoris: Secondary | ICD-10-CM | POA: Diagnosis not present

## 2015-05-08 DIAGNOSIS — M6281 Muscle weakness (generalized): Secondary | ICD-10-CM | POA: Diagnosis not present

## 2015-05-08 DIAGNOSIS — I480 Paroxysmal atrial fibrillation: Secondary | ICD-10-CM | POA: Diagnosis not present

## 2015-05-08 DIAGNOSIS — R7989 Other specified abnormal findings of blood chemistry: Secondary | ICD-10-CM | POA: Diagnosis not present

## 2015-05-08 DIAGNOSIS — I5022 Chronic systolic (congestive) heart failure: Secondary | ICD-10-CM | POA: Diagnosis not present

## 2015-05-08 DIAGNOSIS — R05 Cough: Secondary | ICD-10-CM | POA: Diagnosis not present

## 2015-05-08 DIAGNOSIS — E0869 Diabetes mellitus due to underlying condition with other specified complication: Secondary | ICD-10-CM | POA: Diagnosis not present

## 2015-05-08 DIAGNOSIS — I10 Essential (primary) hypertension: Secondary | ICD-10-CM | POA: Diagnosis not present

## 2015-05-08 DIAGNOSIS — R278 Other lack of coordination: Secondary | ICD-10-CM | POA: Diagnosis not present

## 2015-05-08 DIAGNOSIS — E46 Unspecified protein-calorie malnutrition: Secondary | ICD-10-CM | POA: Diagnosis not present

## 2015-05-08 DIAGNOSIS — G3184 Mild cognitive impairment, so stated: Secondary | ICD-10-CM | POA: Diagnosis not present

## 2015-05-08 DIAGNOSIS — F10230 Alcohol dependence with withdrawal, uncomplicated: Secondary | ICD-10-CM | POA: Diagnosis not present

## 2015-05-08 DIAGNOSIS — E119 Type 2 diabetes mellitus without complications: Secondary | ICD-10-CM | POA: Diagnosis not present

## 2015-05-08 DIAGNOSIS — I739 Peripheral vascular disease, unspecified: Secondary | ICD-10-CM | POA: Diagnosis not present

## 2015-05-08 DIAGNOSIS — R2689 Other abnormalities of gait and mobility: Secondary | ICD-10-CM | POA: Diagnosis not present

## 2015-05-08 DIAGNOSIS — F10288 Alcohol dependence with other alcohol-induced disorder: Secondary | ICD-10-CM | POA: Diagnosis not present

## 2015-05-08 DIAGNOSIS — R251 Tremor, unspecified: Secondary | ICD-10-CM | POA: Diagnosis not present

## 2015-05-08 DIAGNOSIS — G9349 Other encephalopathy: Secondary | ICD-10-CM | POA: Diagnosis not present

## 2015-05-08 DIAGNOSIS — J449 Chronic obstructive pulmonary disease, unspecified: Secondary | ICD-10-CM | POA: Diagnosis not present

## 2015-05-08 DIAGNOSIS — R5381 Other malaise: Secondary | ICD-10-CM | POA: Diagnosis not present

## 2015-05-08 DIAGNOSIS — I426 Alcoholic cardiomyopathy: Secondary | ICD-10-CM | POA: Diagnosis not present

## 2015-05-08 DIAGNOSIS — R0989 Other specified symptoms and signs involving the circulatory and respiratory systems: Secondary | ICD-10-CM | POA: Diagnosis not present

## 2015-05-08 DIAGNOSIS — G9340 Encephalopathy, unspecified: Secondary | ICD-10-CM | POA: Diagnosis not present

## 2015-05-08 DIAGNOSIS — I4891 Unspecified atrial fibrillation: Secondary | ICD-10-CM | POA: Diagnosis not present

## 2015-05-08 LAB — GLUCOSE, CAPILLARY
GLUCOSE-CAPILLARY: 129 mg/dL — AB (ref 65–99)
GLUCOSE-CAPILLARY: 157 mg/dL — AB (ref 65–99)

## 2015-05-08 MED ORDER — LEVALBUTEROL HCL 0.63 MG/3ML IN NEBU: 0.6300 mg | INHALATION_SOLUTION | RESPIRATORY_TRACT | Status: DC | PRN

## 2015-05-08 MED ORDER — ACETAMINOPHEN 325 MG PO TABS: 650.0000 mg | ORAL_TABLET | Freq: Four times a day (QID) | ORAL | Status: AC | PRN

## 2015-05-08 MED ORDER — RESOURCE THICKENUP CLEAR PO POWD: 1.0000 | ORAL | Status: DC | PRN

## 2015-05-08 MED ORDER — ENSURE ENLIVE PO LIQD: 237.0000 mL | Freq: Two times a day (BID) | ORAL | Status: DC

## 2015-05-08 MED ORDER — ONDANSETRON HCL 4 MG PO TABS: 4.0000 mg | ORAL_TABLET | Freq: Four times a day (QID) | ORAL | Status: DC | PRN

## 2015-05-08 MED ORDER — IPRATROPIUM BROMIDE 0.02 % IN SOLN: 0.5000 mg | RESPIRATORY_TRACT | Status: DC | PRN

## 2015-05-08 MED ORDER — VITAMIN B-1 100 MG PO TABS: 100.0000 mg | ORAL_TABLET | Freq: Every day | ORAL | Status: DC

## 2015-05-08 NOTE — Discharge Instructions (Signed)
Confusion Confusion is the inability to think with your usual speed or clarity. Confusion may come on quickly or slowly over time. How quickly the confusion comes on depends on the cause. Confusion can be due to any number of causes. CAUSES   Concussion, head injury, or head trauma.  Seizures.  Stroke.  Fever.  Brain tumor.  Age related decreased brain function (dementia).  Heightened emotional states like rage or terror.  Mental illness in which the person loses the ability to determine what is real and what is not (hallucinations).  Infections such as a urinary tract infection (UTI).  Toxic effects from alcohol, drugs, or prescription medicines.  Dehydration and an imbalance of salts in the body (electrolytes).  Lack of sleep.  Low blood sugar (diabetes).  Low levels of oxygen from conditions such as chronic lung disorders.  Drug interactions or other medicine side effects.  Nutritional deficiencies, especially niacin, thiamine, vitamin C, or vitamin B.  Sudden drop in body temperature (hypothermia).  Change in routine, such as when traveling or hospitalized. SIGNS AND SYMPTOMS  People often describe their thinking as cloudy or unclear when they are confused. Confusion can also include feeling disoriented. That means you are unaware of where or who you are. You may also not know what the date or time is. If confused, you may also have difficulty paying attention, remembering, and making decisions. Some people also act aggressively when they are confused.  DIAGNOSIS  The medical evaluation of confusion may include:  Blood and urine tests.  X-rays.  Brain and nervous system tests.  Analyzing your brain waves (electroencephalogram or EEG).  Magnetic resonance imaging (MRI) of your head.  Computed tomography (CT) scan of your head.  Mental status tests in which your health care provider may ask many questions. Some of these questions may seem silly or strange,  but they are a very important test to help diagnose and treat confusion. TREATMENT  An admission to the hospital may not be needed, but a person with confusion should not be left alone. Stay with a family member or friend until the confusion clears. Avoid alcohol, pain relievers, or sedative drugs until you have fully recovered. Do not drive until directed by your health care provider. HOME CARE INSTRUCTIONS  What family and friends can do:  To find out if someone is confused, ask the person to state his or her name, age, and the date. If the person is unsure or answers incorrectly, he or she is confused.  Always introduce yourself, no matter how well the person knows you.  Often remind the person of his or her location.  Place a calendar and clock near the confused person.  Help the person with his or her medicines. You may want to use a pill box, an alarm as a reminder, or give the person each dose as prescribed.  Talk about current events and plans for the day.  Try to keep the environment calm, quiet, and peaceful.  Make sure the person keeps follow-up visits with his or her health care provider. PREVENTION  Ways to prevent confusion:  Avoid alcohol.  Eat a balanced diet.  Get enough sleep.  Take medicine only as directed by your health care provider.  Do not become isolated. Spend time with other people and make plans for your days.  Keep careful watch on your blood sugar levels if you are diabetic. SEEK IMMEDIATE MEDICAL CARE IF:   You develop severe headaches, repeated vomiting, seizures, blackouts, or   slurred speech.  There is increasing confusion, weakness, numbness, restlessness, or personality changes.  You develop a loss of balance, have marked dizziness, feel uncoordinated, or fall.  You have delusions, hallucinations, or develop severe anxiety.  Your family members think you need to be rechecked.   This information is not intended to replace advice given  to you by your health care provider. Make sure you discuss any questions you have with your health care provider.   Document Released: 05/15/2004 Document Revised: 04/28/2014 Document Reviewed: 05/13/2013 Elsevier Interactive Patient Education 2016 Elsevier Inc.  

## 2015-05-08 NOTE — Clinical Social Work Placement (Signed)
   CLINICAL SOCIAL WORK PLACEMENT  NOTE  Date:  05/08/2015  Patient Details  Name: Johnny [name] MRN: [id number] Date of Birth: [birth year]  Clinical Social Work is seeking post-discharge placement for this patient at the Skilled  Nursing Facility level of care (*CSW will initial, date and re-position this form in  chart as items are completed):  Yes   Patient/family provided with [facility] Clinical Social Work Department's list of facilities offering this level of care within the geographic area requested by the patient (or if unable, by the patient's family).  Yes   Patient/family informed of their freedom to choose among providers that offer the needed level of care, that participate in Medicare, Medicaid or managed care program needed by the patient, have an available bed and are willing to accept the patient.  Yes   Patient/family informed of [facility]'s ownership interest in [facility] and [facility], as well as of the fact that they are under no obligation to receive care at these facilities.  PASRR submitted to EDS on       PASRR number received on       Existing PASRR number confirmed on 05/08/15     FL2 transmitted to all facilities in geographic area requested by pt/family on       FL2 transmitted to all facilities within larger geographic area on       Patient informed that his/her managed care company has contracts with or will negotiate with certain facilities, including the following:        Yes   Patient/family informed of bed offers received.  Patient chooses bed at  [facility])     Physician recommends and patient chooses bed at      Patient to be transferred to  [facility]) on 05/08/15.  Patient to be transferred to facility by  [facility])     Patient family notified on 05/08/15 of transfer.  Name of family member notified:   (Daughter)     PHYSICIAN       Additional Comment:     _______________________________________________ Adrian Blackwater, LCSW 05/08/2015, 4:31 PM

## 2015-05-08 NOTE — Progress Notes (Signed)
Pt VSS. IV removed. Tele d/c'd. Report called to receiving facility [facility]. No further questions/ concerns.

## 2015-05-08 NOTE — Progress Notes (Signed)
Nutrition Follow-up  DOCUMENTATION CODES:   Not applicable  INTERVENTION:  - Continue Ensure Enlive BID - RD will continue to monitor for needs  NUTRITION DIAGNOSIS:   Inadequate oral intake related to poor appetite as evidenced by per patient/family report. -improving  GOAL:   Patient will meet greater than or equal to 90% of their needs -likely met on average with recent diet advancement  MONITOR:   PO intake, Labs, I & O's, Supplement acceptance  ASSESSMENT:   Presented with weakness and fatigue which she attributes to alcohol abuse. Patient was too weak to get up to use the bathroom and was found by EMS intervention urine and feces sitting on his wheelchair. Reports usually able to walk but has not since 2 days ago. She states he started to drink again and stopped taking any of his medications. He is usually on metoprolol. Patient lives alone and does not want any placement. Has history of diabetes it's mainly diet-controlled. Patient has known history of alcohol abuse. Has history of paroxysmal atrial fibrillation not on anticoagulation candidate secondary to alcohol abuse.  1/17 Per chart review, pt was NPO 1/14 through lunch on 1/15. Pt then consumed 50% of dinner on 1/15 on Dysphagia 1, nectar-thick diet. Diet was advanced per SLP recommendations to Dysphagia 2, thin liquids yesterday (1/16) at 1306 and pt consumed 100% of all meals. Pt states that he ate breakfast potatoes and greens (?) for breakfast this AM. He does not recall having tried Ensure since admission; encouraged him to drink it today. Pt with some slight confusion during assessment.  Likely to meet needs with recent diet advancement and supplement order in place. Medications reviewed. Labs reviewed; CBGs: 76-171 mg/dL, L: 3.4 mmol/L, Ca: 8.7 mg/dL.   1/11 - Spoke with pt at bedside.  - Pt reports poor po intake related to weakness, and alcohol abuse.  - He endorses no chewing or swallowing problems; no  nausea/vomiting/diarrhea/constipation.  - Nutrition-Focused physical exam completed.  - Findings are moderate fat depletion, mild muscle depletion, and no edema.  - Pt stated his usual body weight is about 190# - He is currently 174# - Per chart, he exhibits a 7#/4% insignificant wt loss in 11 months. He has not been 190#, per chart, since 4/15; 188# on 8/15.    Diet Order:  DIET DYS 2 Room service appropriate?: Yes with Assist; Fluid consistency:: Thin  Skin:  Reviewed, no issues  Last BM:  1/13  Height:   Ht Readings from Last 1 Encounters:  05/01/15 5' 11" (1.803 m)    Weight:   Wt Readings from Last 1 Encounters:  05/02/15 174 lb 6.1 oz (79.1 kg)    Ideal Body Weight:  78.18 kg  BMI:  Body mass index is 24.33 kg/(m^2).  Estimated Nutritional Needs:   Kcal:  1950-2350  Protein:  80-90 grams  Fluid:  >/= 2L  EDUCATION NEEDS:   No education needs identified at this time     Jarome Matin, RD, LDN Inpatient Clinical Dietitian Pager # [phone] After hours/weekend pager # [phone]

## 2015-05-08 NOTE — Consult Note (Signed)
   [facility] CM Inpatient Consult   05/08/2015  Johnny [name] [birth year] [id number]   [name] follow up from [facility] referral. Met with patient who is alert and oriented at this time. Explained Johnny [name] program services. He endorses he will go to SNF after hospital discharge. Explained to him that [name] could follow up while at facility to ensure smooth transition if plan is to return home from SNF. Johnny [name] agreeable to [name] follow up. However, he reports he can not sign written consent due to having tremors in his right hand and " I never learned to write with my left hand". Therefore verbal consent for [name] services noted. Will request for patient to be assigned to [name] once SNF facility confirmed. [facility] Care [name] packet and contact information left at bedside. Spoke with inpatient Licensed CSW prior to engaging patient.   Marthenia Rolling, MSN-Ed, RN,BSN [facility] Liaison [phone]
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
## 2015-05-11 ENCOUNTER — Encounter: Payer: Self-pay | Admitting: *Deleted

## 2015-05-14 DIAGNOSIS — M79641 Pain in right hand: Secondary | ICD-10-CM | POA: Diagnosis not present

## 2015-05-14 DIAGNOSIS — R251 Tremor, unspecified: Secondary | ICD-10-CM | POA: Diagnosis not present

## 2015-05-14 DIAGNOSIS — E119 Type 2 diabetes mellitus without complications: Secondary | ICD-10-CM | POA: Diagnosis not present

## 2015-05-15 DIAGNOSIS — R278 Other lack of coordination: Secondary | ICD-10-CM | POA: Diagnosis not present

## 2015-05-15 DIAGNOSIS — M6281 Muscle weakness (generalized): Secondary | ICD-10-CM | POA: Diagnosis not present

## 2015-05-15 DIAGNOSIS — R5381 Other malaise: Secondary | ICD-10-CM | POA: Diagnosis not present

## 2015-05-15 DIAGNOSIS — G3184 Mild cognitive impairment, so stated: Secondary | ICD-10-CM | POA: Diagnosis not present

## 2015-05-16 ENCOUNTER — Ambulatory Visit: Payer: Self-pay | Admitting: *Deleted

## 2015-05-17 ENCOUNTER — Encounter: Payer: Self-pay | Admitting: *Deleted

## 2015-05-17 ENCOUNTER — Other Ambulatory Visit: Payer: Self-pay | Admitting: *Deleted

## 2015-05-17 NOTE — Patient Outreach (Signed)
[facility]) Care Management  [facility] Social Work  05/17/2015  Johnny [name] 12-Jul-1934 [id number]  Subjective:    "I expect I'll be here awhile so you'll have plenty of time to decide what I'll need".  Objective:   CSW will follow patient at [facility] [place] where patient currently resides to receive short-term rehabilitative services, to assess and assist with discharge planning needs and services.  Current Medications:  Current Outpatient Prescriptions  Medication Sig Dispense Refill  . ACCU-CHEK SOFTCLIX LANCETS lancets 1 each by Other route daily as needed for other. 100 each 12  . acetaminophen (TYLENOL) 325 MG tablet Take 2 tablets (650 mg total) by mouth every 6 (six) hours as needed for mild pain (or Fever >/= 101). 30 tablet 0  . aspirin 81 MG tablet Take 81 mg by mouth daily.    Marland Kitchen atorvastatin (LIPITOR) 20 MG tablet TAKE 1 TABLET (20 MG TOTAL) BY MOUTH DAILY. (Patient not taking: Reported on 05/01/2015) 90 tablet 3  . feeding supplement, ENSURE ENLIVE, (ENSURE ENLIVE) LIQD Take 237 mLs by mouth 2 (two) times daily between meals. 237 mL 12  . glucose blood (ACCU-CHEK AVIVA PLUS) test strip 1 each by Other route daily as needed for other. 100 each 12  . ipratropium (ATROVENT) 0.02 % nebulizer solution Take 2.5 mLs (0.5 mg total) by nebulization every 4 (four) hours as needed for wheezing or shortness of breath. 75 mL 12  . levalbuterol (XOPENEX) 0.63 MG/3ML nebulizer solution Take 3 mLs (0.63 mg total) by nebulization every 4 (four) hours as needed for wheezing. 3 mL 12  . Maltodextrin-Xanthan Gum (RESOURCE THICKENUP CLEAR) POWD Take 120 g by mouth as needed. 1 Can 0  . metoprolol (LOPRESSOR) 50 MG tablet TAKE 1.5 TABLETS (75 MG TOTAL) BY MOUTH 2 (TWO) TIMES DAILY. 180 tablet 0  . Multiple Vitamin (MULTIVITAMIN) tablet Take 1 tablet by mouth daily.    . ondansetron (ZOFRAN) 4 MG tablet Take 1 tablet (4 mg total) by mouth every 6  (six) hours as needed for nausea. 20 tablet 0  . tamsulosin (FLOMAX) 0.4 MG CAPS capsule TAKE 1 CAPSULE (0.4 MG TOTAL) BY MOUTH DAILY. 90 capsule 3  . thiamine (VITAMIN B-1) 100 MG tablet Take 1 tablet (100 mg total) by mouth daily. 30 tablet 0   No current facility-administered medications for this visit.    Functional Status:  In your present state of health, do you have any difficulty performing the following activities: 05/17/2015 05/01/2015  Hearing? N N  Vision? N N  Difficulty concentrating or making decisions? Johnny [name]  Walking or climbing stairs? Y Y  Dressing or bathing? Y Y  Doing errands, shopping? Johnny [name]  Preparing Food and eating ? Y -  Using the Toilet? Y -  In the past six months, have you accidently leaked urine? Y -  Do you have problems with loss of bowel control? N -  Managing your Medications? Y -  Managing your Finances? N -  Housekeeping or managing your Housekeeping? Y -    Fall/Depression Screening:  PHQ 2/9 Scores 05/17/2015 03/23/2015 03/02/2014 11/15/2012  PHQ - 2 Score 1 0 0 0    Assessment:   CSW was able to make contact with patient today to perform the initial assessment, as well as assess and assist with social work needs and services.  CSW met with patient at [facility] [place] where patient currently resides to receive short-term  rehabilitative services.  CSW introduced self, explained role and types of services provided through [facility] ([facility]).  CSW further explained to patient that CSW works with patient's RNCM, also with [facility], Johnny [name]. CSW then explained the reason for the call, indicating that Mrs. Johnny [name] thought that patient would benefit from social work services and resources to assist with discharge planning needs and services from the skilled facility.  CSW obtained two HIPAA compliant identifiers from patient, which included patient's name and date of  birth. Patient did not appear to be in good spirits today, reporting that he was not feeling well.  CSW noted that patient was experiencing tremors pretty bad, preventing him from being able to get comfortable during the visit.  Patient kept falling asleep during the visit, needing to be woken to finish his sentence.  Patient encouraged CSW to contact his daughter to answer the assessment questions, but Johnny [name] reported that she was unavailable, encouraging her dad to "be cooperative".  CSW provided Mrs. Johnny [name] with CSW's contact information, encouraging her to contact CSW directly to discuss discharge planning needs and services for patient.  Mrs. Johnny [name] voiced understanding and was agreeable to this plan.   Plan:   CSW will attend patient's Discharge Planning Meeting, scheduled for Thursday, May 31, 2015 at 11:00am, at [facility] [place] where patient currently resides to receive short-term rehabilitative services. CSW will prescribe and print EMMI information to review with patient at the next routine visit. CSW will converse with Johnny [name], RNCM with [facility], to report findings of initial visit with patient today. CSW will fax a barriers letter and correspondence letter to patient's Primary Care Physician, Dr. Bluford [name] to ensure that Dr. Burnice [name] is aware of CSW's involvement with patient's care.  Johnny [name], BSW, MSW, LCSW  Licensed Education officer, environmental Health System  Mailing [place] [address] [place], [place] [address] Physical Address-[address], [place], [place] [address] Toll Free Main # [phone] Fax # [phone] Cell # [phone]  Fax # [phone]  [email or web address]   [place] complies with [employer] civil rights laws and does not discriminate on the basis of race, color, national  origin, age, disability, or sex.  Espaol (Spanish)  [place] cumple con las leyes federales de derechos civiles aplicables y no discrimina por motivos de raza, color, nacionalidad, edad, discapacidad o sexo.     Ti?ng Vi?t ([place])  [place]ron tun th? lu?t dn quy?n hi?n hnh c?a Lin bang v khng phn bi?t ?i x? d?a trn ch?ng t?c, mu da, ngu?n g?c qu?c gia, ? tu?i, khuy?t t?t, ho?c gi?i tnh.     (Arabic)

## 2015-05-22 DIAGNOSIS — J449 Chronic obstructive pulmonary disease, unspecified: Secondary | ICD-10-CM | POA: Diagnosis not present

## 2015-05-22 DIAGNOSIS — R5381 Other malaise: Secondary | ICD-10-CM | POA: Diagnosis not present

## 2015-05-22 DIAGNOSIS — R0989 Other specified symptoms and signs involving the circulatory and respiratory systems: Secondary | ICD-10-CM | POA: Diagnosis not present

## 2015-05-22 DIAGNOSIS — R05 Cough: Secondary | ICD-10-CM | POA: Diagnosis not present

## 2015-05-31 ENCOUNTER — Encounter: Payer: Self-pay | Admitting: *Deleted

## 2015-05-31 ENCOUNTER — Other Ambulatory Visit: Payer: Self-pay | Admitting: *Deleted

## 2015-05-31 NOTE — Patient Outreach (Signed)
[place] [facility]) Care Management  05/31/2015  Johnny [name] 1935-03-13 [id number]    CSW was able to meet with patient today at [facility] [place] where patient currently resides to receive short-term rehabilitative services, to attend patient's Discharge Planning Meeting.  Patient appeared to be much clearer today, talking logically and realistically about his plan of care up release from the facility.  The plan is for patient to be discharged over the weekend, where patient will return home to live alone.  Patient will have been receiving therapy services (both physical and occupational) for a total of 25 days.  Patient denies having pain or experiencing tremors. Patient feels confident that he will be able to return home to live independently upon discharge from [facility].  Patient is agreeable to having his physical therapist arrange for him to receive home health physical and occupational therapy through a home health agency of his choice.  Patient is also agreeable to durable medical equipment, if recommended.  Patient indicated that his daughters plan to check in on him on a daily basis to ensure that he is taking his medications appropriately and able to adequately prepare his own meals.  Patient also reports having neighbors that "look after him".  No additional social work needs identified at this time. CSW will perform a case closure on patient, as all goals of treatment have been met from social work standpoint and no additional social work needs have been identified at this time. CSW will notify patient's RNCM with [facility], Valente David of CSW's plans to close patient's case. CSW will fax a correspondence letter to patient's Primary Care Physician, [name] to ensure that [name] is aware of CSW's plans to close patient's case.   CSW will submit a case closure request to Lurline Del, Care Management Assistant with [facility], in the form of an [employer].  CSW will ensure that Mrs. Laurance Flatten is aware of Roma Schanz, RNCM with [facility], continued involvement with patient's care.   Nat Christen, BSW, MSW, LCSW  Licensed Education officer, environmental Health System  Mailing [place] [address] [place], [place] [address] Physical Address-[address], [place], [place] [address] Toll Free Main # [phone] Fax # [phone] Cell # [phone]  Fax # [phone]  [email or web address]    [facility] complies with [employer] civil rights laws and does not discriminate on the basis of race, color, national origin, age, disability, or sex.  Espaol (Spanish)  [place] cumple con las leyes federales de derechos civiles aplicables y no discrimina por motivos de raza, color, nacionalidad, edad, discapacidad o sexo.     Ti?ng Vi?t ([place])  [place] tun th? lu?t dn quy?n hi?n hnh c?a Lin bang v khng phn bi?t ?i x? d?a trn ch?ng t?c, mu da, ngu?n g?c qu?c gia, ? tu?i, khuy?t t?t, ho?c gi?i tnh.     (Arabic)    [place] is Against the [employer]. and its subsidiaries comply with [employer] civil rights laws and do not discriminate on the basis of race, color, national origin, age, disability, or sex. [place] do not exclude people  or treat them differently because of race, color, national origin, age, disability, or sex.    [employer]. and its subsidiaries provide:  . Free auxiliary aids and services, such as qualified sign language interpreters, video remote  interpretation, and written information in other formats to people with disabilities when such auxiliary aids and services are necessary to ensure an equal opportunity to participate. . Free language services to people whose primary language is not English when those services are necessary to provide meaningful access, such as translated documents or oral interpretation.    If you need these services, call [phone] or if you use a TTY, call 711.   If you believe that [employer]. and its subsidiaries have failed to provide these services or discriminated in another way on the basis of race, color, national origin, age, disability, or sex, you can file a Tourist information centre manager with:   Discrimination [place] [address]   If you need help filing a grievance, call [phone] or if you use a TTY, call 711.  You can also file a civil rights complaint with the U.S. Department of Health and Financial controller, Office for Civil Rights electronically through the Office for Civil Rights Complaint Portal, available at OnSiteLending.nl.jsf, or by mail or phone at:   [place]. Department of Health and Human Services  [place], [place], [facility]  [place], [place]  [phone], [phone] (TDD)  Complaint forms are available at CutFunds.si [place]: ATTENTION: If you do not speak English, language assistance services, free of charge, are available to you. Call [phone] (TTY: 254).  Espaol (Spanish): ATENCIN: si habla espaol, tiene a su disposicin servicios gratuitos de asistencia lingstica. Llame al [phone] (TTY: 151). ???? (Chinese): ?????????????????????????????? [phone]?TTY: 711??  Ti?ng Vi?t (Vietnamese): CH : N?u b?n ni Ti?ng Vi?t, c cc d?ch v? h? tr? ngn ng? mi?n ph dnh cho b?n. G?i s? [phone] (TTY:  350).  ??? ([place]): ?? : ???? ????? ?? , ?? ?? ???? ??? ???? ? ???? . [phone] (TTY: 711)??? ??? ???? .  Tagalog (Tagalog - Filipino): PAUNAWA: Kung nagsasalita ka ng Tagalog, maaari kang gumamit ng mga serbisyo ng tulong sa wika nang walang bayad. [place] sa [phone] (TTY: 175).   [place]): :      ,      .  [phone] (: 423).  Kreyl Ayisyen ([place]): ATANSYON: Si w pale Ethelene Hal, gen svis d pou lang ki disponib gratis pou ou. Rele [phone] (TTY: 086).  Fonnie Jarvis Marland KitchenPakistan): ATTENTION : Si vous parlez franais, des services d'aide linguistique vous sont proposs gratuitement. Appelez le [phone] (ATS : 124).  Polski (Polish): UWAGA: Jeeli mwisz po polsku, moesz skorzysta z bezpatnej pomocy jzykowej. Zadzwo pod numer [phone] (TTY: 053).  Portugus ([place]): ATENO: Se fala portugus, encontram-se disponveis servios lingusticos, grtis. Ligue para [phone] (TTY: 024).  Italiano ([place]): ATTENZIONE: In caso la lingua parlata sia l'italiano, sono disponibili servizi di assistenza linguistica gratuiti. Chiamare il numero [phone] (TTY: 268).  Dawayne Patricia ([place]): ACHTUNG: Wenn Sie Deutsch sprechen,  stehen Ihnen kostenlos sprachliche Hilfsdienstleistungen zur [employer]. Rufnummer: [phone] (TTY: 892).   (Arabic): [phone]   .            : .)481 :   (  ??? ([place]): ??????????????????????????????????[phone] ?TTY?711?????????????????  ? (Farsi): [phone]  . ?   ? ?  ? ? ?~ ?  ?    : .??  (TTY: 711)  Din Bizaad ([place]): [phone]  baa ak0 n7n7zin: D77 saad bee y1n7[ti'go Risa Grill, saad bee [id number]'d66', t'11 Pricilla Loveless  n1 h0l=, koj8' [id number] [phone] (TTY:   711).

## 2015-06-04 ENCOUNTER — Encounter: Payer: Self-pay | Admitting: *Deleted

## 2015-06-05 ENCOUNTER — Other Ambulatory Visit: Payer: Self-pay | Admitting: *Deleted

## 2015-06-05 NOTE — Patient Outreach (Signed)
Notified by Child psychotherapist that member would be discharging over the weekend.  Attempt made to contact member to initiate transition of care program.  No answer, unable to leave a message.  Will make another attempt to contact later this week.  Kemper Durie, BSN, [facility] Care Management  [facility] Care Manager [phone]

## 2015-06-07 ENCOUNTER — Other Ambulatory Visit: Payer: Self-pay | Admitting: *Deleted

## 2015-06-07 NOTE — Patient Outreach (Signed)
Second attempt made to contact member without success.  Unable to leave a message at both home number [phone]) and cell number [phone]).  Will make third attempt next week.  If remain unsuccessful, will make an attempt to contact daughter and send outreach letter.  Kemper Durie, BSN, [facility] Care Management  [facility] Care Manager [phone]

## 2015-06-12 DIAGNOSIS — M549 Dorsalgia, unspecified: Secondary | ICD-10-CM | POA: Diagnosis not present

## 2015-06-12 DIAGNOSIS — F1010 Alcohol abuse, uncomplicated: Secondary | ICD-10-CM | POA: Diagnosis not present

## 2015-06-12 DIAGNOSIS — E1151 Type 2 diabetes mellitus with diabetic peripheral angiopathy without gangrene: Secondary | ICD-10-CM | POA: Diagnosis not present

## 2015-06-12 DIAGNOSIS — I480 Paroxysmal atrial fibrillation: Secondary | ICD-10-CM | POA: Diagnosis not present

## 2015-06-12 DIAGNOSIS — I5032 Chronic diastolic (congestive) heart failure: Secondary | ICD-10-CM | POA: Diagnosis not present

## 2015-06-12 DIAGNOSIS — I110 Hypertensive heart disease with heart failure: Secondary | ICD-10-CM | POA: Diagnosis not present

## 2015-06-12 DIAGNOSIS — M6281 Muscle weakness (generalized): Secondary | ICD-10-CM | POA: Diagnosis not present

## 2015-06-12 DIAGNOSIS — I2510 Atherosclerotic heart disease of native coronary artery without angina pectoris: Secondary | ICD-10-CM | POA: Diagnosis not present

## 2015-06-12 DIAGNOSIS — J449 Chronic obstructive pulmonary disease, unspecified: Secondary | ICD-10-CM | POA: Diagnosis not present

## 2015-06-13 DIAGNOSIS — I2510 Atherosclerotic heart disease of native coronary artery without angina pectoris: Secondary | ICD-10-CM | POA: Diagnosis not present

## 2015-06-13 DIAGNOSIS — I5032 Chronic diastolic (congestive) heart failure: Secondary | ICD-10-CM | POA: Diagnosis not present

## 2015-06-13 DIAGNOSIS — M549 Dorsalgia, unspecified: Secondary | ICD-10-CM | POA: Diagnosis not present

## 2015-06-13 DIAGNOSIS — F1010 Alcohol abuse, uncomplicated: Secondary | ICD-10-CM | POA: Diagnosis not present

## 2015-06-13 DIAGNOSIS — J449 Chronic obstructive pulmonary disease, unspecified: Secondary | ICD-10-CM | POA: Diagnosis not present

## 2015-06-13 DIAGNOSIS — I110 Hypertensive heart disease with heart failure: Secondary | ICD-10-CM | POA: Diagnosis not present

## 2015-06-13 DIAGNOSIS — E1151 Type 2 diabetes mellitus with diabetic peripheral angiopathy without gangrene: Secondary | ICD-10-CM | POA: Diagnosis not present

## 2015-06-13 DIAGNOSIS — I480 Paroxysmal atrial fibrillation: Secondary | ICD-10-CM | POA: Diagnosis not present

## 2015-06-13 DIAGNOSIS — M6281 Muscle weakness (generalized): Secondary | ICD-10-CM | POA: Diagnosis not present

## 2015-06-14 ENCOUNTER — Telehealth: Payer: Self-pay | Admitting: *Deleted

## 2015-06-14 NOTE — Telephone Encounter (Signed)
Received message on my voicemail, requesting verbal orders for pt. Barnetta Chapel said pt refusing Home Health, Evaluation for PT was done and needs orders for 2 x's a week for 5 weeks for Physical Therapy. Call back # [phone].  Called Mark back, verbal orders given for Physical Therapy 2 x's a week for 5 weeks okay per Dr.K. Loraine Leriche verbalized understanding.

## 2015-06-15 ENCOUNTER — Encounter: Payer: Self-pay | Admitting: *Deleted

## 2015-06-15 ENCOUNTER — Other Ambulatory Visit: Payer: Self-pay | Admitting: *Deleted

## 2015-06-15 NOTE — Patient Outreach (Signed)
Fourth attempt made to contact member to initiate transition of care program.  He answers the phone, but is reluctant to provide information.  This care manager introduced self and purpose of call, reminding him of the social worker, J. Saporito, that was involved in his care while he was in rehab.  He states "there have been so many people calling and coming over here I don't know who I talked to."  Member state that he has been having "someone" come over for home visits, [facility] involved for nursing and physical therapy.  He also report that his daughter and neighbors have been active in his care, and that his daughter will take him to his follow appointment scheduled for 3/7.    Attempted to review medications, but member state that he does not have them "right here" and will not be able to review.  He state that he is taking them as prescribed with the help of his daughter.  He state that he has not been taking his blood sugar because he is in need of a new glucose meter.  This care manger will contact member's daughter regarding concerns and potential needs in the home, as well as review medications.  Will verify that she will be available to transport member to follow up appointment.  Will continue with weekly transition of care calls next week.  Kemper Durie, BSN, [facility] Care Management  [facility] Care Manager [phone]

## 2015-06-16 ENCOUNTER — Other Ambulatory Visit: Payer: Self-pay | Admitting: Internal Medicine

## 2015-06-18 ENCOUNTER — Encounter: Payer: Self-pay | Admitting: *Deleted

## 2015-06-18 DIAGNOSIS — M549 Dorsalgia, unspecified: Secondary | ICD-10-CM | POA: Diagnosis not present

## 2015-06-18 DIAGNOSIS — I5032 Chronic diastolic (congestive) heart failure: Secondary | ICD-10-CM | POA: Diagnosis not present

## 2015-06-18 DIAGNOSIS — F1010 Alcohol abuse, uncomplicated: Secondary | ICD-10-CM | POA: Diagnosis not present

## 2015-06-18 DIAGNOSIS — E1151 Type 2 diabetes mellitus with diabetic peripheral angiopathy without gangrene: Secondary | ICD-10-CM | POA: Diagnosis not present

## 2015-06-18 DIAGNOSIS — I2510 Atherosclerotic heart disease of native coronary artery without angina pectoris: Secondary | ICD-10-CM | POA: Diagnosis not present

## 2015-06-18 DIAGNOSIS — I480 Paroxysmal atrial fibrillation: Secondary | ICD-10-CM | POA: Diagnosis not present

## 2015-06-18 DIAGNOSIS — I110 Hypertensive heart disease with heart failure: Secondary | ICD-10-CM | POA: Diagnosis not present

## 2015-06-18 DIAGNOSIS — J449 Chronic obstructive pulmonary disease, unspecified: Secondary | ICD-10-CM | POA: Diagnosis not present

## 2015-06-18 DIAGNOSIS — M6281 Muscle weakness (generalized): Secondary | ICD-10-CM | POA: Diagnosis not present

## 2015-06-20 DIAGNOSIS — F1010 Alcohol abuse, uncomplicated: Secondary | ICD-10-CM | POA: Diagnosis not present

## 2015-06-20 DIAGNOSIS — I480 Paroxysmal atrial fibrillation: Secondary | ICD-10-CM | POA: Diagnosis not present

## 2015-06-20 DIAGNOSIS — I110 Hypertensive heart disease with heart failure: Secondary | ICD-10-CM | POA: Diagnosis not present

## 2015-06-20 DIAGNOSIS — M6281 Muscle weakness (generalized): Secondary | ICD-10-CM | POA: Diagnosis not present

## 2015-06-20 DIAGNOSIS — E1151 Type 2 diabetes mellitus with diabetic peripheral angiopathy without gangrene: Secondary | ICD-10-CM | POA: Diagnosis not present

## 2015-06-20 DIAGNOSIS — J449 Chronic obstructive pulmonary disease, unspecified: Secondary | ICD-10-CM | POA: Diagnosis not present

## 2015-06-20 DIAGNOSIS — I5032 Chronic diastolic (congestive) heart failure: Secondary | ICD-10-CM | POA: Diagnosis not present

## 2015-06-20 DIAGNOSIS — I2510 Atherosclerotic heart disease of native coronary artery without angina pectoris: Secondary | ICD-10-CM | POA: Diagnosis not present

## 2015-06-20 DIAGNOSIS — M549 Dorsalgia, unspecified: Secondary | ICD-10-CM | POA: Diagnosis not present

## 2015-06-21 ENCOUNTER — Other Ambulatory Visit: Payer: Self-pay | Admitting: *Deleted

## 2015-06-21 DIAGNOSIS — M549 Dorsalgia, unspecified: Secondary | ICD-10-CM | POA: Diagnosis not present

## 2015-06-21 DIAGNOSIS — I110 Hypertensive heart disease with heart failure: Secondary | ICD-10-CM | POA: Diagnosis not present

## 2015-06-21 DIAGNOSIS — M6281 Muscle weakness (generalized): Secondary | ICD-10-CM | POA: Diagnosis not present

## 2015-06-21 DIAGNOSIS — J449 Chronic obstructive pulmonary disease, unspecified: Secondary | ICD-10-CM | POA: Diagnosis not present

## 2015-06-21 DIAGNOSIS — I5032 Chronic diastolic (congestive) heart failure: Secondary | ICD-10-CM | POA: Diagnosis not present

## 2015-06-21 DIAGNOSIS — I480 Paroxysmal atrial fibrillation: Secondary | ICD-10-CM | POA: Diagnosis not present

## 2015-06-21 DIAGNOSIS — E1151 Type 2 diabetes mellitus with diabetic peripheral angiopathy without gangrene: Secondary | ICD-10-CM | POA: Diagnosis not present

## 2015-06-21 DIAGNOSIS — F1010 Alcohol abuse, uncomplicated: Secondary | ICD-10-CM | POA: Diagnosis not present

## 2015-06-21 DIAGNOSIS — I2510 Atherosclerotic heart disease of native coronary artery without angina pectoris: Secondary | ICD-10-CM | POA: Diagnosis not present

## 2015-06-21 NOTE — Patient Outreach (Signed)
Weekly transition of care call placed to member.  Member states "I'm breathing so I believe I'll live another day."  He denies any pain, discomfort, or concerns at this time.  He states that he has been taking his medications as prescribed, then state "well, I try to."  He reports that he has a visit today with physical and occupational therapy.  He confirms home visit scheduled for Monday afternoon.  Permission granted to contact daughter, Oran Rein, to discuss concerns and needs.    Call placed to daughter, she state that she is currently at work and unable to talk.  She state that she will call this care manager back.  Will await call back.  Kemper Durie, BSN, [facility] Care Management  [facility] Care Manager [phone]

## 2015-06-22 DIAGNOSIS — F1010 Alcohol abuse, uncomplicated: Secondary | ICD-10-CM | POA: Diagnosis not present

## 2015-06-22 DIAGNOSIS — I110 Hypertensive heart disease with heart failure: Secondary | ICD-10-CM | POA: Diagnosis not present

## 2015-06-22 DIAGNOSIS — M549 Dorsalgia, unspecified: Secondary | ICD-10-CM | POA: Diagnosis not present

## 2015-06-22 DIAGNOSIS — E1151 Type 2 diabetes mellitus with diabetic peripheral angiopathy without gangrene: Secondary | ICD-10-CM | POA: Diagnosis not present

## 2015-06-22 DIAGNOSIS — J449 Chronic obstructive pulmonary disease, unspecified: Secondary | ICD-10-CM | POA: Diagnosis not present

## 2015-06-22 DIAGNOSIS — M6281 Muscle weakness (generalized): Secondary | ICD-10-CM | POA: Diagnosis not present

## 2015-06-22 DIAGNOSIS — I480 Paroxysmal atrial fibrillation: Secondary | ICD-10-CM | POA: Diagnosis not present

## 2015-06-22 DIAGNOSIS — I2510 Atherosclerotic heart disease of native coronary artery without angina pectoris: Secondary | ICD-10-CM | POA: Diagnosis not present

## 2015-06-22 DIAGNOSIS — I5032 Chronic diastolic (congestive) heart failure: Secondary | ICD-10-CM | POA: Diagnosis not present

## 2015-06-25 ENCOUNTER — Other Ambulatory Visit: Payer: Self-pay | Admitting: *Deleted

## 2015-06-25 ENCOUNTER — Other Ambulatory Visit: Payer: Self-pay | Admitting: Internal Medicine

## 2015-06-25 ENCOUNTER — Encounter: Payer: Self-pay | Admitting: *Deleted

## 2015-06-25 DIAGNOSIS — I5032 Chronic diastolic (congestive) heart failure: Secondary | ICD-10-CM | POA: Diagnosis not present

## 2015-06-25 DIAGNOSIS — F1010 Alcohol abuse, uncomplicated: Secondary | ICD-10-CM | POA: Diagnosis not present

## 2015-06-25 DIAGNOSIS — M6281 Muscle weakness (generalized): Secondary | ICD-10-CM | POA: Diagnosis not present

## 2015-06-25 DIAGNOSIS — I480 Paroxysmal atrial fibrillation: Secondary | ICD-10-CM | POA: Diagnosis not present

## 2015-06-25 DIAGNOSIS — J449 Chronic obstructive pulmonary disease, unspecified: Secondary | ICD-10-CM | POA: Diagnosis not present

## 2015-06-25 DIAGNOSIS — I110 Hypertensive heart disease with heart failure: Secondary | ICD-10-CM | POA: Diagnosis not present

## 2015-06-25 DIAGNOSIS — I2510 Atherosclerotic heart disease of native coronary artery without angina pectoris: Secondary | ICD-10-CM | POA: Diagnosis not present

## 2015-06-25 DIAGNOSIS — E1151 Type 2 diabetes mellitus with diabetic peripheral angiopathy without gangrene: Secondary | ICD-10-CM | POA: Diagnosis not present

## 2015-06-25 DIAGNOSIS — M549 Dorsalgia, unspecified: Secondary | ICD-10-CM | POA: Diagnosis not present

## 2015-06-26 DIAGNOSIS — F1010 Alcohol abuse, uncomplicated: Secondary | ICD-10-CM | POA: Diagnosis not present

## 2015-06-26 DIAGNOSIS — M6281 Muscle weakness (generalized): Secondary | ICD-10-CM | POA: Diagnosis not present

## 2015-06-26 DIAGNOSIS — M549 Dorsalgia, unspecified: Secondary | ICD-10-CM | POA: Diagnosis not present

## 2015-06-26 DIAGNOSIS — I110 Hypertensive heart disease with heart failure: Secondary | ICD-10-CM | POA: Diagnosis not present

## 2015-06-26 DIAGNOSIS — J449 Chronic obstructive pulmonary disease, unspecified: Secondary | ICD-10-CM | POA: Diagnosis not present

## 2015-06-26 DIAGNOSIS — I2510 Atherosclerotic heart disease of native coronary artery without angina pectoris: Secondary | ICD-10-CM | POA: Diagnosis not present

## 2015-06-26 DIAGNOSIS — I480 Paroxysmal atrial fibrillation: Secondary | ICD-10-CM | POA: Diagnosis not present

## 2015-06-26 DIAGNOSIS — E1151 Type 2 diabetes mellitus with diabetic peripheral angiopathy without gangrene: Secondary | ICD-10-CM | POA: Diagnosis not present

## 2015-06-26 DIAGNOSIS — I5032 Chronic diastolic (congestive) heart failure: Secondary | ICD-10-CM | POA: Diagnosis not present

## 2015-06-26 NOTE — Patient Outreach (Signed)
[name]) Care Management   06/26/2015  [name] 01-01-1935 [id number]  Johnny [name] is an 81 y.o. male  Subjective:   Member reports that he is "doing all right."  He denies any pain, discomfort, shortness of breath or concerns.  He report that he takes all of his medications as prescribed, but state he only has about 4 medications that he take.  He state that his daughter visit him a couple times a week, but that he has friends and neighbors that check on him daily.  Objective:   Review of Systems  Constitutional: Negative.   HENT: Negative.   Eyes: Negative.   Respiratory: Negative.   Cardiovascular: Negative.   Gastrointestinal: Negative.   Genitourinary: Negative.   Musculoskeletal: Negative.   Skin: Negative.   Neurological: Negative.   Endo/Heme/Allergies: Negative.   Psychiatric/Behavioral: Negative.     Physical Exam  Constitutional: He is oriented to person, place, and time. He appears well-developed.  Neck: Normal range of motion.  Cardiovascular: Normal rate, regular rhythm and normal heart sounds.   Respiratory: Effort normal and breath sounds normal.  GI: Soft. Bowel sounds are normal.  Musculoskeletal: Normal range of motion.  Neurological: He is alert and oriented to person, place, and time.  Skin: Skin is warm and dry.    BP 152/78 mmHg  Pulse 68  Resp 18  Ht 1.753 m (5' 9")  Wt 182 lb (82.555 kg)  BMI 26.86 kg/m2  SpO2 98%   Current Medications:   Current Outpatient Prescriptions  Medication Sig Dispense Refill  . aspirin 81 MG tablet Take 81 mg by mouth daily.    Marland Kitchen atorvastatin (LIPITOR) 20 MG tablet TAKE 1 TABLET (20 MG TOTAL) BY MOUTH DAILY. 90 tablet 3  . metoprolol (LOPRESSOR) 50 MG tablet TAKE 1.5 TABLETS (75 MG TOTAL) BY MOUTH 2 (TWO) TIMES DAILY. 180 tablet 1  . Multiple Vitamin (MULTIVITAMIN) tablet Take 1 tablet by mouth daily.    . tamsulosin (FLOMAX) 0.4 MG CAPS capsule TAKE 1 CAPSULE (0.4 MG TOTAL) BY MOUTH  DAILY. 90 capsule 3  . ACCU-CHEK AVIVA PLUS test strip USE TO TEST BLOOD SUGAR ONCE A DAY 100 each 4  . ACCU-CHEK SOFTCLIX LANCETS lancets 1 each by Other route daily as needed for other. 100 each 12  . acetaminophen (TYLENOL) 325 MG tablet Take 2 tablets (650 mg total) by mouth every 6 (six) hours as needed for mild pain (or Fever >/= 101). 30 tablet 0  . feeding supplement, ENSURE ENLIVE, (ENSURE ENLIVE) LIQD Take 237 mLs by mouth 2 (two) times daily between meals. 237 mL 12  . ipratropium (ATROVENT) 0.02 % nebulizer solution Take 2.5 mLs (0.5 mg total) by nebulization every 4 (four) hours as needed for wheezing or shortness of breath. (Patient not taking: Reported on 06/25/2015) 75 mL 12  . levalbuterol (XOPENEX) 0.63 MG/3ML nebulizer solution Take 3 mLs (0.63 mg total) by nebulization every 4 (four) hours as needed for wheezing. (Patient not taking: Reported on 06/25/2015) 3 mL 12  . Maltodextrin-Xanthan Gum (RESOURCE THICKENUP CLEAR) POWD Take 120 g by mouth as needed. 1 Can 0  . ondansetron (ZOFRAN) 4 MG tablet Take 1 tablet (4 mg total) by mouth every 6 (six) hours as needed for nausea. 20 tablet 0  . thiamine (VITAMIN B-1) 100 MG tablet Take 1 tablet (100 mg total) by mouth daily. 30 tablet 0   No current facility-administered medications for this visit.    Functional Status:  In your present state of health, do you have any difficulty performing the following activities: 06/25/2015 05/31/2015  Hearing? N Y  Vision? Y N  Difficulty concentrating or making decisions? N Y  Walking or climbing stairs? Y Y  Dressing or bathing? Y Y  Doing errands, shopping? [name]  Preparing Food and eating ? Y Y  Using the Toilet? N Y  In the past six months, have you accidently leaked urine? N Y  Do you have problems with loss of bowel control? N N  Managing your Medications? Y Y  Managing your Finances? [name]  Housekeeping or managing your Housekeeping? [name]    Fall/Depression Screening:    PHQ 2/9 Scores  06/15/2015 05/31/2015 05/17/2015 03/23/2015 03/02/2014 11/15/2012  PHQ - 2 Score 0 0 1 0 0 0   Fall Risk  06/15/2015 05/31/2015 05/17/2015 03/23/2015 03/02/2014  Falls in the past year? Yes Yes Yes No No  Number falls in past yr: 2 or more 2 or more 2 or more - -  Injury with Fall? Yes Yes Yes - -  Risk Factor Category  High Fall Risk High Fall Risk High Fall Risk - -  Risk for fall due to : History of fall(s) History of fall(s);Impaired balance/gait;Impaired mobility History of fall(s);Impaired balance/gait;Impaired mobility - -  Follow up Falls prevention discussed Education provided;Falls prevention discussed Education provided;Falls prevention discussed - -    Assessment:    Met with member at scheduled time for initial home visit.  Member has 2 friends at the home during visit, but not involved in conversation.  Inquired about the privacy of today's discussion, member states "they're ok" and allows them to stay present.    Member is in wheelchair with walker nearby during assessment.  He state that he is able to use his walker to get down the hall, to the bedroom and bathroom, but depends on his wheelchair when he is out of the home.  He currently receives home visits from [facility] for occupational and physical therapy.  This care manager expresses concern regarding the member living home alone, but the member state that he is "fine."  He report receiving personal care assistance on Tuesdays and Fridays, of which he pays out of pocket for.  Discussed the possibility of having assistance more days he state that he will look into it.  He does not qualify for full Medicaid benefits and is unable to receive services for free.  Medications reviewed, member has most on his list with the exception of vitamins and nebulizers.  He state that he will have his daughter buy the vitamins from the store.  He reports that he is able to take them, but has trouble opening the bottles.  Member provided with a pill box to  maintain adherence, filled for the week.  Assessed member's ability to open pockets on pill box, he is able to do so without problem.  Member reports that he has a blood pressure machine, but does not monitor daily.  Educated on correct use of machine, return demonstration provided.  He state that he does not have a glucose meter to check his blood sugar, but one is found in the home.  He has lancets, but no strips.  Call placed to pharmacy to reorder strips.  He denies any concerns.  This care manager expresses concern again about the member's ability to care for himself.  He state that he feels that he is able to adequately care for  himself.  Discussed the possibility of an assisted living facility, he state that he has not thought about plans if he is unable to care for self, encouraged to speak with daughters.  Member state that he has a follow up appointment on tomorrow, reports that his daughter will transport him.  Call placed to PCP office to confirm, receptionist state that he has no appointment scheduled until April.  Informed her that member has recently been discharged from hospital/rehab, and will need to schedule appointment.  Call placed to member's daughter to discuss potential concerns and to discuss the need for a follow up at PCP, no answer, HIPPA compliant voice message left.  Contact information provided to member, encouraged contact with any questions.  Plan:   Will await call back from daughter regarding concerns/needs, ability of member to care for self, and the need for follow up appointment. Will continue with weekly transition of care calls next week. Will send EMMI education for diabetes management, including signs and symptoms of hyper/hypoglycemia. Routine home visit scheduled for next month.  THN CM Care Plan Problem One        Most Recent Value   Care Plan Problem One  Recent hospitalization with discharge to rehab facility   Role Documenting the Problem One  Care  Management [place] for Problem One  Active   THN Long Term Goal (31-90 days)  Member will not be readmitted to hospital within the next 31 days   THN Long Term Goal Start Date  06/15/15   Interventions for Problem One Long Term Goal  Discussed with member the importance of following discharge instructions, including follow up appointments, medications, diet, and home health involvement, to decrease the risk of readmission   THN CM Short Term Goal #1 (0-30 days)  Member will report taking medications as prescribed over the next 4 weeks   THN CM Short Term Goal #1 Start Date  06/15/15   Interventions for Short Term Goal #1  Discussed with member the importance of following discharge instructions, including follow up appointments, medications, diet, and home health involvement, to decrease the risk of readmission   THN CM Short Term Goal #2 (0-30 days)  Member will attend follow up appointment with PCP within the next 4 weeks   THN CM Short Term Goal #2 Start Date  06/15/15   Interventions for Short Term Goal #2  Discussed with member the importance of following discharge instructions, including follow up appointments, medications, diet, and home health involvement, to decrease the risk of readmission     Johnny [name], BSN, [place] Manager [phone]

## 2015-06-27 ENCOUNTER — Encounter: Payer: Self-pay | Admitting: *Deleted

## 2015-06-27 ENCOUNTER — Other Ambulatory Visit: Payer: Self-pay | Admitting: *Deleted

## 2015-06-27 DIAGNOSIS — M6281 Muscle weakness (generalized): Secondary | ICD-10-CM | POA: Diagnosis not present

## 2015-06-27 DIAGNOSIS — F1010 Alcohol abuse, uncomplicated: Secondary | ICD-10-CM | POA: Diagnosis not present

## 2015-06-27 DIAGNOSIS — E1151 Type 2 diabetes mellitus with diabetic peripheral angiopathy without gangrene: Secondary | ICD-10-CM | POA: Diagnosis not present

## 2015-06-27 DIAGNOSIS — I480 Paroxysmal atrial fibrillation: Secondary | ICD-10-CM | POA: Diagnosis not present

## 2015-06-27 DIAGNOSIS — I110 Hypertensive heart disease with heart failure: Secondary | ICD-10-CM | POA: Diagnosis not present

## 2015-06-27 DIAGNOSIS — M549 Dorsalgia, unspecified: Secondary | ICD-10-CM | POA: Diagnosis not present

## 2015-06-27 DIAGNOSIS — J449 Chronic obstructive pulmonary disease, unspecified: Secondary | ICD-10-CM | POA: Diagnosis not present

## 2015-06-27 DIAGNOSIS — I2510 Atherosclerotic heart disease of native coronary artery without angina pectoris: Secondary | ICD-10-CM | POA: Diagnosis not present

## 2015-06-27 DIAGNOSIS — I5032 Chronic diastolic (congestive) heart failure: Secondary | ICD-10-CM | POA: Diagnosis not present

## 2015-06-27 NOTE — Patient Outreach (Signed)
Call placed back to daughter, Johnny [name], to discuss member's plan of care and the need for a follow up appointment with PCP (no call received after message left earlier this week).  Call placed to both numbers listed, HIPPA complaint voice message left.  Will await call back.    Johnny [name], BSN, [facility] Care Management  [facility] Care Manager [phone]

## 2015-06-28 DIAGNOSIS — J449 Chronic obstructive pulmonary disease, unspecified: Secondary | ICD-10-CM | POA: Diagnosis not present

## 2015-06-28 DIAGNOSIS — I2510 Atherosclerotic heart disease of native coronary artery without angina pectoris: Secondary | ICD-10-CM | POA: Diagnosis not present

## 2015-06-28 DIAGNOSIS — F1010 Alcohol abuse, uncomplicated: Secondary | ICD-10-CM | POA: Diagnosis not present

## 2015-06-28 DIAGNOSIS — E1151 Type 2 diabetes mellitus with diabetic peripheral angiopathy without gangrene: Secondary | ICD-10-CM | POA: Diagnosis not present

## 2015-06-28 DIAGNOSIS — M549 Dorsalgia, unspecified: Secondary | ICD-10-CM | POA: Diagnosis not present

## 2015-06-28 DIAGNOSIS — I5032 Chronic diastolic (congestive) heart failure: Secondary | ICD-10-CM | POA: Diagnosis not present

## 2015-06-28 DIAGNOSIS — I110 Hypertensive heart disease with heart failure: Secondary | ICD-10-CM | POA: Diagnosis not present

## 2015-06-28 DIAGNOSIS — M6281 Muscle weakness (generalized): Secondary | ICD-10-CM | POA: Diagnosis not present

## 2015-06-28 DIAGNOSIS — I480 Paroxysmal atrial fibrillation: Secondary | ICD-10-CM | POA: Diagnosis not present

## 2015-07-02 ENCOUNTER — Other Ambulatory Visit: Payer: Self-pay | Admitting: *Deleted

## 2015-07-02 DIAGNOSIS — F1010 Alcohol abuse, uncomplicated: Secondary | ICD-10-CM | POA: Diagnosis not present

## 2015-07-02 DIAGNOSIS — I5032 Chronic diastolic (congestive) heart failure: Secondary | ICD-10-CM | POA: Diagnosis not present

## 2015-07-02 DIAGNOSIS — I110 Hypertensive heart disease with heart failure: Secondary | ICD-10-CM | POA: Diagnosis not present

## 2015-07-02 DIAGNOSIS — M549 Dorsalgia, unspecified: Secondary | ICD-10-CM | POA: Diagnosis not present

## 2015-07-02 DIAGNOSIS — M6281 Muscle weakness (generalized): Secondary | ICD-10-CM | POA: Diagnosis not present

## 2015-07-02 DIAGNOSIS — E1151 Type 2 diabetes mellitus with diabetic peripheral angiopathy without gangrene: Secondary | ICD-10-CM | POA: Diagnosis not present

## 2015-07-02 DIAGNOSIS — I480 Paroxysmal atrial fibrillation: Secondary | ICD-10-CM | POA: Diagnosis not present

## 2015-07-02 DIAGNOSIS — I2510 Atherosclerotic heart disease of native coronary artery without angina pectoris: Secondary | ICD-10-CM | POA: Diagnosis not present

## 2015-07-02 DIAGNOSIS — J449 Chronic obstructive pulmonary disease, unspecified: Secondary | ICD-10-CM | POA: Diagnosis not present

## 2015-07-02 NOTE — Patient Outreach (Signed)
Weekly transition of care call placed to member.  Male answering the phone state that the member is not there at the time.  Request made (using HIPPA) for member to call this care manager back.  Will await call back.  Kemper DurieMonica Adalea Handler, BSN, [facility] Care Management  [facility] Care Manager [phone]

## 2015-07-04 DIAGNOSIS — F1010 Alcohol abuse, uncomplicated: Secondary | ICD-10-CM | POA: Diagnosis not present

## 2015-07-04 DIAGNOSIS — J449 Chronic obstructive pulmonary disease, unspecified: Secondary | ICD-10-CM | POA: Diagnosis not present

## 2015-07-04 DIAGNOSIS — M6281 Muscle weakness (generalized): Secondary | ICD-10-CM | POA: Diagnosis not present

## 2015-07-04 DIAGNOSIS — I5032 Chronic diastolic (congestive) heart failure: Secondary | ICD-10-CM | POA: Diagnosis not present

## 2015-07-04 DIAGNOSIS — M549 Dorsalgia, unspecified: Secondary | ICD-10-CM | POA: Diagnosis not present

## 2015-07-04 DIAGNOSIS — I2510 Atherosclerotic heart disease of native coronary artery without angina pectoris: Secondary | ICD-10-CM | POA: Diagnosis not present

## 2015-07-04 DIAGNOSIS — I110 Hypertensive heart disease with heart failure: Secondary | ICD-10-CM | POA: Diagnosis not present

## 2015-07-04 DIAGNOSIS — E1151 Type 2 diabetes mellitus with diabetic peripheral angiopathy without gangrene: Secondary | ICD-10-CM | POA: Diagnosis not present

## 2015-07-04 DIAGNOSIS — I480 Paroxysmal atrial fibrillation: Secondary | ICD-10-CM | POA: Diagnosis not present

## 2015-07-06 ENCOUNTER — Other Ambulatory Visit: Payer: Self-pay | Admitting: *Deleted

## 2015-07-06 DIAGNOSIS — E1151 Type 2 diabetes mellitus with diabetic peripheral angiopathy without gangrene: Secondary | ICD-10-CM | POA: Diagnosis not present

## 2015-07-06 DIAGNOSIS — J449 Chronic obstructive pulmonary disease, unspecified: Secondary | ICD-10-CM | POA: Diagnosis not present

## 2015-07-06 DIAGNOSIS — I110 Hypertensive heart disease with heart failure: Secondary | ICD-10-CM | POA: Diagnosis not present

## 2015-07-06 DIAGNOSIS — I480 Paroxysmal atrial fibrillation: Secondary | ICD-10-CM | POA: Diagnosis not present

## 2015-07-06 DIAGNOSIS — I5032 Chronic diastolic (congestive) heart failure: Secondary | ICD-10-CM | POA: Diagnosis not present

## 2015-07-06 DIAGNOSIS — I2510 Atherosclerotic heart disease of native coronary artery without angina pectoris: Secondary | ICD-10-CM | POA: Diagnosis not present

## 2015-07-06 DIAGNOSIS — M549 Dorsalgia, unspecified: Secondary | ICD-10-CM | POA: Diagnosis not present

## 2015-07-06 DIAGNOSIS — M6281 Muscle weakness (generalized): Secondary | ICD-10-CM | POA: Diagnosis not present

## 2015-07-06 DIAGNOSIS — F1010 Alcohol abuse, uncomplicated: Secondary | ICD-10-CM | POA: Diagnosis not present

## 2015-07-06 NOTE — Patient Outreach (Signed)
Call placed to member and daughter, Oran ReinSuprena, in attempt to follow up on member's status and discuss follow up appointment with PCP.  No answer for both member and daughter, unable to leave message.  Will continue attempts to reach them next week.  Kemper DurieMonica Kathlean Cinco, BSN, [facility] Care Management  [facility] Care Manager [phone]

## 2015-07-09 ENCOUNTER — Telehealth: Payer: Self-pay | Admitting: Internal Medicine

## 2015-07-09 ENCOUNTER — Other Ambulatory Visit: Payer: Self-pay | Admitting: *Deleted

## 2015-07-09 DIAGNOSIS — F1010 Alcohol abuse, uncomplicated: Secondary | ICD-10-CM | POA: Diagnosis not present

## 2015-07-09 DIAGNOSIS — I2510 Atherosclerotic heart disease of native coronary artery without angina pectoris: Secondary | ICD-10-CM | POA: Diagnosis not present

## 2015-07-09 DIAGNOSIS — E1151 Type 2 diabetes mellitus with diabetic peripheral angiopathy without gangrene: Secondary | ICD-10-CM | POA: Diagnosis not present

## 2015-07-09 DIAGNOSIS — I5032 Chronic diastolic (congestive) heart failure: Secondary | ICD-10-CM | POA: Diagnosis not present

## 2015-07-09 DIAGNOSIS — I480 Paroxysmal atrial fibrillation: Secondary | ICD-10-CM | POA: Diagnosis not present

## 2015-07-09 DIAGNOSIS — M549 Dorsalgia, unspecified: Secondary | ICD-10-CM | POA: Diagnosis not present

## 2015-07-09 DIAGNOSIS — J449 Chronic obstructive pulmonary disease, unspecified: Secondary | ICD-10-CM | POA: Diagnosis not present

## 2015-07-09 DIAGNOSIS — M6281 Muscle weakness (generalized): Secondary | ICD-10-CM | POA: Diagnosis not present

## 2015-07-09 DIAGNOSIS — I110 Hypertensive heart disease with heart failure: Secondary | ICD-10-CM | POA: Diagnosis not present

## 2015-07-09 NOTE — Patient Outreach (Signed)
Call placed to member to follow up on current health status and retrieval of diabetic supplies from the pharmacy.  Member states that he is "fine."  He denies complaints or concerns at this time.  He denies having his daughter get the diabetic supplies, stating that he will have his daughter check the pharmacy today.  Discussed the need for a follow up appointment, he states that he has an appointment next month.  Informed that he should have had a follow up appointment within 2 weeks of his discharge from rehab.  He confirms that his daughter Jasmine DecemberSharon provides transportation to his appointments.  Informed that this care manager has left several messages for daughter Oran ReinSuprena without any call backs.  Requested contact information for Jasmine DecemberSharon to be able to call regarding appointment and supplies.  Member refuses to provide that information, stating that he would call to tell her.  Encouraged to discuss the need for diabetic supplies and follow up appointment.  He verbalizes understanding.  Sheria Langameron, occupational therapist with [facility], present during discussion.  She requested contact information for member's PCP in order to request medical equipment.  Concern regarding the member being able to live independently and care for himself expressed, she state that he "does pretty good" alone, and that she will be recommending a transfer bench for his shower.  Contact information obtained for nurse from [facility] for potential collaboration.    Member denies any concerns at this time, encouraged to contact for questions.  Routine home visit scheduled for 4/3.  Kemper DurieMonica Zykira Matlack, BSN, [facility] Care Management  [facility] Care Manager [phone]

## 2015-07-09 NOTE — Telephone Encounter (Signed)
[name] for tub transfer bench fax to [phone] or [phone] attn teresa couch

## 2015-07-09 NOTE — Telephone Encounter (Signed)
Order faxed to [phone].

## 2015-07-12 DIAGNOSIS — I5032 Chronic diastolic (congestive) heart failure: Secondary | ICD-10-CM | POA: Diagnosis not present

## 2015-07-12 DIAGNOSIS — I480 Paroxysmal atrial fibrillation: Secondary | ICD-10-CM | POA: Diagnosis not present

## 2015-07-12 DIAGNOSIS — I2510 Atherosclerotic heart disease of native coronary artery without angina pectoris: Secondary | ICD-10-CM | POA: Diagnosis not present

## 2015-07-12 DIAGNOSIS — M549 Dorsalgia, unspecified: Secondary | ICD-10-CM | POA: Diagnosis not present

## 2015-07-12 DIAGNOSIS — M6281 Muscle weakness (generalized): Secondary | ICD-10-CM | POA: Diagnosis not present

## 2015-07-12 DIAGNOSIS — I110 Hypertensive heart disease with heart failure: Secondary | ICD-10-CM | POA: Diagnosis not present

## 2015-07-12 DIAGNOSIS — E1151 Type 2 diabetes mellitus with diabetic peripheral angiopathy without gangrene: Secondary | ICD-10-CM | POA: Diagnosis not present

## 2015-07-12 DIAGNOSIS — J449 Chronic obstructive pulmonary disease, unspecified: Secondary | ICD-10-CM | POA: Diagnosis not present

## 2015-07-12 DIAGNOSIS — F1010 Alcohol abuse, uncomplicated: Secondary | ICD-10-CM | POA: Diagnosis not present

## 2015-07-13 DIAGNOSIS — I110 Hypertensive heart disease with heart failure: Secondary | ICD-10-CM | POA: Diagnosis not present

## 2015-07-13 DIAGNOSIS — I2510 Atherosclerotic heart disease of native coronary artery without angina pectoris: Secondary | ICD-10-CM | POA: Diagnosis not present

## 2015-07-13 DIAGNOSIS — I480 Paroxysmal atrial fibrillation: Secondary | ICD-10-CM | POA: Diagnosis not present

## 2015-07-13 DIAGNOSIS — E1151 Type 2 diabetes mellitus with diabetic peripheral angiopathy without gangrene: Secondary | ICD-10-CM | POA: Diagnosis not present

## 2015-07-13 DIAGNOSIS — M6281 Muscle weakness (generalized): Secondary | ICD-10-CM | POA: Diagnosis not present

## 2015-07-13 DIAGNOSIS — J449 Chronic obstructive pulmonary disease, unspecified: Secondary | ICD-10-CM | POA: Diagnosis not present

## 2015-07-13 DIAGNOSIS — F1010 Alcohol abuse, uncomplicated: Secondary | ICD-10-CM | POA: Diagnosis not present

## 2015-07-13 DIAGNOSIS — M549 Dorsalgia, unspecified: Secondary | ICD-10-CM | POA: Diagnosis not present

## 2015-07-13 DIAGNOSIS — I5032 Chronic diastolic (congestive) heart failure: Secondary | ICD-10-CM | POA: Diagnosis not present

## 2015-07-17 DIAGNOSIS — I2510 Atherosclerotic heart disease of native coronary artery without angina pectoris: Secondary | ICD-10-CM | POA: Diagnosis not present

## 2015-07-17 DIAGNOSIS — J449 Chronic obstructive pulmonary disease, unspecified: Secondary | ICD-10-CM | POA: Diagnosis not present

## 2015-07-17 DIAGNOSIS — M549 Dorsalgia, unspecified: Secondary | ICD-10-CM | POA: Diagnosis not present

## 2015-07-17 DIAGNOSIS — I480 Paroxysmal atrial fibrillation: Secondary | ICD-10-CM | POA: Diagnosis not present

## 2015-07-17 DIAGNOSIS — F1010 Alcohol abuse, uncomplicated: Secondary | ICD-10-CM | POA: Diagnosis not present

## 2015-07-17 DIAGNOSIS — M6281 Muscle weakness (generalized): Secondary | ICD-10-CM | POA: Diagnosis not present

## 2015-07-17 DIAGNOSIS — I110 Hypertensive heart disease with heart failure: Secondary | ICD-10-CM | POA: Diagnosis not present

## 2015-07-17 DIAGNOSIS — I5032 Chronic diastolic (congestive) heart failure: Secondary | ICD-10-CM | POA: Diagnosis not present

## 2015-07-17 DIAGNOSIS — E1151 Type 2 diabetes mellitus with diabetic peripheral angiopathy without gangrene: Secondary | ICD-10-CM | POA: Diagnosis not present

## 2015-07-19 DIAGNOSIS — E1151 Type 2 diabetes mellitus with diabetic peripheral angiopathy without gangrene: Secondary | ICD-10-CM | POA: Diagnosis not present

## 2015-07-19 DIAGNOSIS — I480 Paroxysmal atrial fibrillation: Secondary | ICD-10-CM | POA: Diagnosis not present

## 2015-07-19 DIAGNOSIS — M6281 Muscle weakness (generalized): Secondary | ICD-10-CM | POA: Diagnosis not present

## 2015-07-19 DIAGNOSIS — F1010 Alcohol abuse, uncomplicated: Secondary | ICD-10-CM | POA: Diagnosis not present

## 2015-07-19 DIAGNOSIS — J449 Chronic obstructive pulmonary disease, unspecified: Secondary | ICD-10-CM | POA: Diagnosis not present

## 2015-07-19 DIAGNOSIS — I110 Hypertensive heart disease with heart failure: Secondary | ICD-10-CM | POA: Diagnosis not present

## 2015-07-19 DIAGNOSIS — M549 Dorsalgia, unspecified: Secondary | ICD-10-CM | POA: Diagnosis not present

## 2015-07-19 DIAGNOSIS — I5032 Chronic diastolic (congestive) heart failure: Secondary | ICD-10-CM | POA: Diagnosis not present

## 2015-07-19 DIAGNOSIS — I2510 Atherosclerotic heart disease of native coronary artery without angina pectoris: Secondary | ICD-10-CM | POA: Diagnosis not present

## 2015-07-20 DIAGNOSIS — M6281 Muscle weakness (generalized): Secondary | ICD-10-CM | POA: Diagnosis not present

## 2015-07-20 DIAGNOSIS — I5032 Chronic diastolic (congestive) heart failure: Secondary | ICD-10-CM | POA: Diagnosis not present

## 2015-07-20 DIAGNOSIS — I110 Hypertensive heart disease with heart failure: Secondary | ICD-10-CM | POA: Diagnosis not present

## 2015-07-20 DIAGNOSIS — E1151 Type 2 diabetes mellitus with diabetic peripheral angiopathy without gangrene: Secondary | ICD-10-CM | POA: Diagnosis not present

## 2015-07-20 DIAGNOSIS — J449 Chronic obstructive pulmonary disease, unspecified: Secondary | ICD-10-CM | POA: Diagnosis not present

## 2015-07-20 DIAGNOSIS — F1010 Alcohol abuse, uncomplicated: Secondary | ICD-10-CM | POA: Diagnosis not present

## 2015-07-20 DIAGNOSIS — I480 Paroxysmal atrial fibrillation: Secondary | ICD-10-CM | POA: Diagnosis not present

## 2015-07-20 DIAGNOSIS — I2510 Atherosclerotic heart disease of native coronary artery without angina pectoris: Secondary | ICD-10-CM | POA: Diagnosis not present

## 2015-07-20 DIAGNOSIS — M549 Dorsalgia, unspecified: Secondary | ICD-10-CM | POA: Diagnosis not present

## 2015-07-23 ENCOUNTER — Other Ambulatory Visit: Payer: Self-pay | Admitting: *Deleted

## 2015-07-23 NOTE — Patient Outreach (Signed)
[name]) Care Management   07/23/2015  Johnny [name] 06-May-1934 [id number]  Johnny [name] is an 81 y.o. male  Subjective:   Objective:   Review of Systems  Constitutional: Negative.   HENT: Negative.   Eyes: Negative.   Respiratory: Negative.   Cardiovascular: Negative.   Gastrointestinal: Negative.   Genitourinary: Negative.   Musculoskeletal: Negative.   Skin: Negative.   Neurological: Positive for tremors.  Endo/Heme/Allergies: Negative.   Psychiatric/Behavioral: Negative.     Physical Exam  Constitutional: He is oriented to person, place, and time. He appears well-developed and well-nourished.  Neck: Normal range of motion.  Cardiovascular: Normal rate.   Irregular   Respiratory: Effort normal and breath sounds normal.  GI: Soft. Bowel sounds are normal.  Musculoskeletal: Normal range of motion.  Neurological: He is alert and oriented to person, place, and time.  Skin: Skin is warm and dry.    Encounter Medications:   Outpatient Encounter Prescriptions as of 07/23/2015  Medication Sig Note  . ACCU-CHEK AVIVA PLUS test strip USE TO TEST BLOOD SUGAR ONCE A DAY (Patient not taking: Reported on 06/26/2015)   . ACCU-CHEK SOFTCLIX LANCETS lancets 1 each by Other route daily as needed for other. (Patient not taking: Reported on 06/26/2015)   . acetaminophen (TYLENOL) 325 MG tablet Take 2 tablets (650 mg total) by mouth every 6 (six) hours as needed for mild pain (or Fever >/= 101).   Marland Kitchen aspirin 81 MG tablet Take 81 mg by mouth daily. 05/01/2015: Pt does not recall the exact time of his last dose & states he has not taken any medication since Friday   . atorvastatin (LIPITOR) 20 MG tablet TAKE 1 TABLET (20 MG TOTAL) BY MOUTH DAILY. 05/01/2015: Pt reports being told by his PCP not to take this medication anymore. Pt states he was not prescribed something in the place of it but was instructed to stop taking it   . feeding supplement, ENSURE ENLIVE, (ENSURE ENLIVE) LIQD  Take 237 mLs by mouth 2 (two) times daily between meals. (Patient not taking: Reported on 06/26/2015)   . ipratropium (ATROVENT) 0.02 % nebulizer solution Take 2.5 mLs (0.5 mg total) by nebulization every 4 (four) hours as needed for wheezing or shortness of breath. (Patient not taking: Reported on 06/25/2015)   . levalbuterol (XOPENEX) 0.63 MG/3ML nebulizer solution Take 3 mLs (0.63 mg total) by nebulization every 4 (four) hours as needed for wheezing. (Patient not taking: Reported on 06/25/2015)   . Maltodextrin-Xanthan Gum (RESOURCE THICKENUP CLEAR) POWD Take 120 g by mouth as needed. (Patient not taking: Reported on 06/26/2015)   . metoprolol (LOPRESSOR) 50 MG tablet TAKE 1.5 TABLETS (75 MG TOTAL) BY MOUTH 2 (TWO) TIMES DAILY.   . Multiple Vitamin (MULTIVITAMIN) tablet Take 1 tablet by mouth daily.   . ondansetron (ZOFRAN) 4 MG tablet Take 1 tablet (4 mg total) by mouth every 6 (six) hours as needed for nausea. (Patient not taking: Reported on 06/26/2015)   . tamsulosin (FLOMAX) 0.4 MG CAPS capsule TAKE 1 CAPSULE (0.4 MG TOTAL) BY MOUTH DAILY.   Marland Kitchen thiamine (VITAMIN B-1) 100 MG tablet Take 1 tablet (100 mg total) by mouth daily. (Patient not taking: Reported on 06/26/2015)    No facility-administered encounter medications on file as of 07/23/2015.    Functional Status:   In your present state of health, do you have any difficulty performing the following activities: 06/25/2015 05/31/2015  Hearing? N Y  Vision? Y N  Difficulty concentrating  or making decisions? N Y  Walking or climbing stairs? Y Y  Dressing or bathing? Y Y  Doing errands, shopping? [name]  Preparing Food and eating ? Y Y  Using the Toilet? N Y  In the past six months, have you accidently leaked urine? N Y  Do you have problems with loss of bowel control? N N  Managing your Medications? Y Y  Managing your Finances? [name]  Housekeeping or managing your Housekeeping? [name]    Fall/Depression Screening:    PHQ 2/9 Scores 06/15/2015 05/31/2015  05/17/2015 03/23/2015 03/02/2014 11/15/2012  PHQ - 2 Score 0 0 1 0 0 0    Assessment:    Met with member at scheduled time for routine home visit. Although member now has supplies for glucose monitoring, he denies checking on a daily basis, stating that he needed more lancets.  This care manager found a different lancing tool with appropriate lancets that can be used for checks.  Member advised to used that lancing tool to retrieve blood, and use the One Touch Ultra strips/meter.  He verbalizes understanding and state he know how to use the machine, but is unable to do so due to a tremor in his hands.  He states that he has friends over most of the time that assist him with steadying his hands for checks (one friend present, states he will assist).  Instructed to document readings.  Member also has not been checking blood pressure on a regular basis.  He demonstrates correct usage of the monitor independently, but does not give reason for not monitoring.  Encouraged to monitor daily and document readings in effort to show PCP trend for better medication management.  He has an appointment later this month.  He verbalizes understanding.    He denies any further concerns.  He state he has an exterminator coming today to evaluate for bed bugs.  This care manager inquired about his personal care aide assisting with cleaning the home on the days that she is available, he states "she don't do much of any cleaning at all." He is aware that the request has been made for increased hours, and is appreciative.  Encouraged to contact this care manager with questions.   Plan:   Routine home visit scheduled for next month.  THN CM Care Plan Problem One        Most Recent Value   Care Plan Problem One  Recent hospitalization with discharge to rehab facility   Role Documenting the Problem One  Care Management [place] for Problem One  Not Active   THN Long Term Goal (31-90 days)  Member will not be  readmitted to hospital within the next 31 days   THN Long Term Goal Start Date  06/15/15   [facility] Long Term Goal Met Date  07/09/15 [Discharged on 2/16 from facility]   Interventions for Problem One Long Term Goal  Discussed with member the importance of following discharge instructions, including follow up appointments, medications, diet, and home health involvement, to decrease the risk of readmission   THN CM Short Term Goal #1 (0-30 days)  Member will report taking medications as prescribed over the next 4 weeks   THN CM Short Term Goal #1 Start Date  06/15/15   [facility] CM Short Term Goal #1 Met Date  07/09/15   Interventions for Short Term Goal #1  Discussed with member the importance of following discharge instructions, including follow up appointments, medications,  diet, and home health involvement, to decrease the risk of readmission   THN CM Short Term Goal #2 (0-30 days)  Member will attend follow up appointment with PCP within the next 3 weeks   THN CM Short Term Goal #2 Start Date  07/23/15 [Appointment date rescheduled due to PCP not being available]   Interventions for Short Term Goal #2  Discussed with member the importance of following discharge instructions, including follow up appointments, medications, diet, and home health involvement, to decrease the risk of readmission. Confirmed PCP appointment for 4/28, member confirms his daughter will provide transportation    [facility] CM Care Plan Problem Two        Most Recent Value   Care Plan Problem Two  Diabetes   Role Documenting the Problem Two  Care Management [place] for Problem Two  Active   THN CM Short Term Goal #1 (0-30 days)  Member will have his diabetic supplies within the next week   THN CM Short Term Goal #1 Start Date  07/09/15   [facility] CM Short Term Goal #1 Met Date   07/23/15   Interventions for Short Term Goal #2   Discussed the importance of blood sugar checks in relation to effectively managing diabetes.  Call  placed to pharmacy to refill test strips and lancets.   THN CM Short Term Goal #2 (0-30 days)  Member will report checking blood sugar daily within the next 4 weeks   THN CM Short Term Goal #2 Start Date  07/23/15 [Goal date reset]   Interventions for Short Term Goal #2  Educated member on correct use of glucose meter and supplies.  Educated on importance of self monitoring, enlisted assistance of friend to help with daily blood sugar checks    THN CM Care Plan Problem Three        Most Recent Value   Care Plan Problem Three  Elevated blood pressure   Role Documenting the Problem Three  Care Management Coordinator   Care Plan for Problem Three  Active   THN Long Term Goal (31-90) days  Member's blood pressure will be below target goal withinthe next 31 days   THN Long Term Goal Start Date  07/23/15   Interventions for Problem Three Long Term Goal  Discussed the importance of maintaining blood pressure below target range (140/90), discussed diet (low sodium, heart healthy), re-educated member on use of blood pressure monitor with return demonstration. Assisted member with filling pill box with proper doses of daily medications.   THN CM Short Term Goal #1 (0-30 days)  Member will check blood pressure daily and record readings within the next 4 weeks   THN CM Short Term Goal #1 Start Date  07/23/15   Interventions for Short Term Goal #1  Discussed the importance of maintaining blood pressure below target range (140/90), discussed diet (low sodium, heart healthy), re-educated member on use of blood pressure monitor with return demonstration     Valente David, BSN, [place] Manager [phone]

## 2015-07-24 DIAGNOSIS — F1010 Alcohol abuse, uncomplicated: Secondary | ICD-10-CM | POA: Diagnosis not present

## 2015-07-24 DIAGNOSIS — I110 Hypertensive heart disease with heart failure: Secondary | ICD-10-CM | POA: Diagnosis not present

## 2015-07-24 DIAGNOSIS — M6281 Muscle weakness (generalized): Secondary | ICD-10-CM | POA: Diagnosis not present

## 2015-07-24 DIAGNOSIS — M549 Dorsalgia, unspecified: Secondary | ICD-10-CM | POA: Diagnosis not present

## 2015-07-24 DIAGNOSIS — I2510 Atherosclerotic heart disease of native coronary artery without angina pectoris: Secondary | ICD-10-CM | POA: Diagnosis not present

## 2015-07-24 DIAGNOSIS — I5032 Chronic diastolic (congestive) heart failure: Secondary | ICD-10-CM | POA: Diagnosis not present

## 2015-07-24 DIAGNOSIS — J449 Chronic obstructive pulmonary disease, unspecified: Secondary | ICD-10-CM | POA: Diagnosis not present

## 2015-07-24 DIAGNOSIS — I480 Paroxysmal atrial fibrillation: Secondary | ICD-10-CM | POA: Diagnosis not present

## 2015-07-24 DIAGNOSIS — E1151 Type 2 diabetes mellitus with diabetic peripheral angiopathy without gangrene: Secondary | ICD-10-CM | POA: Diagnosis not present

## 2015-07-26 DIAGNOSIS — F1010 Alcohol abuse, uncomplicated: Secondary | ICD-10-CM | POA: Diagnosis not present

## 2015-07-26 DIAGNOSIS — M549 Dorsalgia, unspecified: Secondary | ICD-10-CM | POA: Diagnosis not present

## 2015-07-26 DIAGNOSIS — J449 Chronic obstructive pulmonary disease, unspecified: Secondary | ICD-10-CM | POA: Diagnosis not present

## 2015-07-26 DIAGNOSIS — I110 Hypertensive heart disease with heart failure: Secondary | ICD-10-CM | POA: Diagnosis not present

## 2015-07-26 DIAGNOSIS — I2510 Atherosclerotic heart disease of native coronary artery without angina pectoris: Secondary | ICD-10-CM | POA: Diagnosis not present

## 2015-07-26 DIAGNOSIS — I480 Paroxysmal atrial fibrillation: Secondary | ICD-10-CM | POA: Diagnosis not present

## 2015-07-26 DIAGNOSIS — I5032 Chronic diastolic (congestive) heart failure: Secondary | ICD-10-CM | POA: Diagnosis not present

## 2015-07-26 DIAGNOSIS — M6281 Muscle weakness (generalized): Secondary | ICD-10-CM | POA: Diagnosis not present

## 2015-07-26 DIAGNOSIS — E1151 Type 2 diabetes mellitus with diabetic peripheral angiopathy without gangrene: Secondary | ICD-10-CM | POA: Diagnosis not present

## 2015-07-27 ENCOUNTER — Ambulatory Visit: Payer: Commercial Managed Care - HMO | Admitting: Internal Medicine

## 2015-08-06 DIAGNOSIS — I5032 Chronic diastolic (congestive) heart failure: Secondary | ICD-10-CM | POA: Diagnosis not present

## 2015-08-06 DIAGNOSIS — F1010 Alcohol abuse, uncomplicated: Secondary | ICD-10-CM | POA: Diagnosis not present

## 2015-08-06 DIAGNOSIS — I2510 Atherosclerotic heart disease of native coronary artery without angina pectoris: Secondary | ICD-10-CM | POA: Diagnosis not present

## 2015-08-06 DIAGNOSIS — I480 Paroxysmal atrial fibrillation: Secondary | ICD-10-CM | POA: Diagnosis not present

## 2015-08-06 DIAGNOSIS — J449 Chronic obstructive pulmonary disease, unspecified: Secondary | ICD-10-CM | POA: Diagnosis not present

## 2015-08-06 DIAGNOSIS — E1151 Type 2 diabetes mellitus with diabetic peripheral angiopathy without gangrene: Secondary | ICD-10-CM | POA: Diagnosis not present

## 2015-08-06 DIAGNOSIS — M549 Dorsalgia, unspecified: Secondary | ICD-10-CM | POA: Diagnosis not present

## 2015-08-06 DIAGNOSIS — I110 Hypertensive heart disease with heart failure: Secondary | ICD-10-CM | POA: Diagnosis not present

## 2015-08-06 DIAGNOSIS — M6281 Muscle weakness (generalized): Secondary | ICD-10-CM | POA: Diagnosis not present

## 2015-08-07 ENCOUNTER — Telehealth: Payer: Self-pay | Admitting: Internal Medicine

## 2015-08-07 NOTE — Telephone Encounter (Signed)
Pt would like to come in on 5/5 at 3:45.  Is it okay to schedule him?

## 2015-08-07 NOTE — Telephone Encounter (Signed)
Pt scheduled 5/5 3:45

## 2015-08-07 NOTE — Telephone Encounter (Signed)
Yes, that is fine. 

## 2015-08-17 ENCOUNTER — Ambulatory Visit: Payer: Commercial Managed Care - HMO | Admitting: Internal Medicine

## 2015-08-20 ENCOUNTER — Other Ambulatory Visit: Payer: Self-pay | Admitting: *Deleted

## 2015-08-20 NOTE — Patient Outreach (Signed)
[facility] ([facility]) Care Management   08/20/2015  Jahree E Esqueda 08/06/1934 [id number]  Johnny [name] is an 81 y.o. male  Subjective:   Member state that he is doing "alright."  He reports that he does not always take his medications, but state that he rarely misses a dose.  He state that he did not go to his PCP visit last week because the physician was out of the office, appointment rescheduled for this Friday.  He reports that his daughter Sharon will transport him.    Objective:   Review of Systems  Constitutional: Negative.   HENT: Negative.   Eyes: Negative.   Respiratory: Negative.   Cardiovascular: Negative.   Gastrointestinal: Negative.   Genitourinary: Negative.   Musculoskeletal: Negative.   Skin: Negative.   Neurological: Positive for tremors.  Endo/Heme/Allergies: Negative.   Psychiatric/Behavioral: Negative.     Physical Exam  Constitutional: He is oriented to person, place, and time. He appears well-developed.  Neck: Normal range of motion.  Cardiovascular: Normal rate, regular rhythm and normal heart sounds.   Respiratory: Effort normal and breath sounds normal.  GI: Soft. Bowel sounds are normal.  Musculoskeletal: Normal range of motion.  Neurological: He is alert and oriented to person, place, and time.  Skin: Skin is warm and dry.    BP 145/55 mmHg  Pulse 101  Resp 18  SpO2 96%   Encounter Medications:   Outpatient Encounter Prescriptions as of 08/20/2015  Medication Sig Note  . aspirin 81 MG tablet Take 81 mg by mouth daily. 05/01/2015: Pt does not recall the exact time of his last dose & states he has not taken any medication since Friday   . atorvastatin (LIPITOR) 20 MG tablet TAKE 1 TABLET (20 MG TOTAL) BY MOUTH DAILY. 05/01/2015: Pt reports being told by his PCP not to take this medication anymore. Pt states he was not prescribed something in the place of it but was instructed to stop taking it   . metoprolol (LOPRESSOR) 50 MG tablet TAKE 1.5  TABLETS (75 MG TOTAL) BY MOUTH 2 (TWO) TIMES DAILY.   . Multiple Vitamin (MULTIVITAMIN) tablet Take 1 tablet by mouth daily. Reported on 07/23/2015   . tamsulosin (FLOMAX) 0.4 MG CAPS capsule TAKE 1 CAPSULE (0.4 MG TOTAL) BY MOUTH DAILY.   . ACCU-CHEK AVIVA PLUS test strip USE TO TEST BLOOD SUGAR ONCE A DAY (Patient not taking: Reported on 08/20/2015)   . ACCU-CHEK SOFTCLIX LANCETS lancets 1 each by Other route daily as needed for other. (Patient not taking: Reported on 06/26/2015)   . acetaminophen (TYLENOL) 325 MG tablet Take 2 tablets (650 mg total) by mouth every 6 (six) hours as needed for mild pain (or Fever >/= 101). (Patient not taking: Reported on 08/20/2015)   . feeding supplement, ENSURE ENLIVE, (ENSURE ENLIVE) LIQD Take 237 mLs by mouth 2 (two) times daily between meals. (Patient not taking: Reported on 08/20/2015)   . ipratropium (ATROVENT) 0.02 % nebulizer solution Take 2.5 mLs (0.5 mg total) by nebulization every 4 (four) hours as needed for wheezing or shortness of breath. (Patient not taking: Reported on 06/25/2015)   . levalbuterol (XOPENEX) 0.63 MG/3ML nebulizer solution Take 3 mLs (0.63 mg total) by nebulization every 4 (four) hours as needed for wheezing. (Patient not taking: Reported on 06/25/2015)   . Maltodextrin-Xanthan Gum (RESOURCE THICKENUP CLEAR) POWD Take 120 g by mouth as needed. (Patient not taking: Reported on 06/26/2015)   . ondansetron (ZOFRAN) 4 MG tablet Take 1 tablet (  4 mg total) by mouth every 6 (six) hours as needed for nausea. (Patient not taking: Reported on 06/26/2015)   . thiamine (VITAMIN B-1) 100 MG tablet Take 1 tablet (100 mg total) by mouth daily. (Patient not taking: Reported on 06/26/2015)    No facility-administered encounter medications on file as of 08/20/2015.    Functional Status:   In your present state of health, do you have any difficulty performing the following activities: 06/25/2015 05/31/2015  Hearing? N Y  Vision? Y N  Difficulty concentrating or making  decisions? N Y  Walking or climbing stairs? Y Y  Dressing or bathing? Y Y  Doing errands, shopping? Y Y  Preparing Food and eating ? Y Y  Using the Toilet? N Y  In the past six months, have you accidently leaked urine? N Y  Do you have problems with loss of bowel control? N N  Managing your Medications? Y Y  Managing your Finances? Y Y  Housekeeping or managing your Housekeeping? Y Y    Fall/Depression Screening:    PHQ 2/9 Scores 06/15/2015 05/31/2015 05/17/2015 03/23/2015 03/02/2014 11/15/2012  PHQ - 2 Score 0 0 1 0 0 0    Assessment:    Met with member at scheduled time, he is home with a neighbor present.  Neighbor not involved in discussion.    Member's home in disarray, clutter in kitchen and living room.  He has half-eaten food on table, bucket with brown liquid in the middle of the living room floor (he state he used it to urinate in, also state he vomited a little in it after drinking Saturday night), member and home has very strong odor of urine, and he is sitting in his wheelchair with his pants pulled only to his knees.  He has one shoe on, no shoe on left foot, sock dirty and looks to have drainage from a possible foot ulcer (great toe hanging through the sock, appears to be infected).  Unable to complete assess foot, member states "It's all right.  I don't have any sores."  Instructed to have PCP assess this Friday.  Medications reviewed, it appears that he has been non-compliant/inconsistent with taking medications as his medication bottles show that they were refilled last year.  He does still have pill box provided by this care manager.  He reports that the last time it was filled was when this care manager was present and filled it, which was on 4/3, and there are pockets where medications still remain.  He has his blood pressure monitor and glucose meter, but have not used it since the last home visit with this care manager.  Member continues to have tremors in hands, right  greater than left.  He state that he has had them for a while, but does not notice them as much when he is drinking.  He reports that his last drink was Saturday, denies daily drinking.  Discussed treatment for alcohol abuse, he state he knows he need to stop, but states that he will not "go anywhere" and that he can quit on his own.  Concerns of member living alone and caring for himself discussed.  He refuses to consider a facility (assisted living or SNF).  He states that his daughters check on him often (not every day) and that he has home health aides on Tuesdays & Thursdays.  He denies hearing from DSS regarding addition hours for in home aide services.  Call placed to contact, Tiffany Lucas, to inquire   about approval for hours.  She state that his hours are not able to be approved at this time because he has had bed bugs and the home is currently being treated for them.  She state that the social worker with her program has been in contact with member, providing updates on process/approval.  Ms. Lucas will provide the social worker with this care manager's contact information to follow up.  He reports that all home health involvement has completed, no longer receives home visits.  Concerns regarding safety and appropriate self care again discussed, he continue to refuse placement.  This care manager has attempted to contact daughter, Suprena, several times to discuss concerns with no call back, he refuses to provide contact information for daughter Sharon.  He denies any needs at this time, encouraged to contact with concerns/questions.  Plan:   Will send PCP notes/quarterly update, Will follow up with member next week regarding PCP appointment, will schedule home visit for next month at that time.   [facility] CM Care Plan Problem One        Most Recent Value   Care Plan Problem One  Inability to care for self   Role Documenting the Problem One  Care Management Coordinator   Care Plan for Problem  One  Active   [facility] CM Short Term Goal #1 (0-30 days)  Member will report taking all medications within the next 4 weeks   [facility] CM Short Term Goal #1 Start Date  08/20/15   Interventions for Short Term Goal #1  Dicsussed the importance of taking medications as prescribed, medication reviewed, pill box refilled   [facility] CM Short Term Goal #2 (0-30 days)  Member will have increased hours of in home nursing aide care   [facility] CM Short Term Goal #2 Start Date  08/20/15   Interventions for Short Term Goal #2  Discussed program with DSS representative, will follow up with social worker    [facility] CM Care Plan Problem Two        Most Recent Value   Care Plan Problem Two  Diabetes   Role Documenting the Problem Two  Care Management Coordinator   Care Plan for Problem Two  Not Active   [facility] CM Short Term Goal #1 (0-30 days)  Member will have his diabetic supplies within the next week   [facility] CM Short Term Goal #1 Start Date  07/09/15   [facility] CM Short Term Goal #1 Met Date   07/23/15   Interventions for Short Term Goal #2   Discussed the importance of blood sugar checks in relation to effectively managing diabetes.  Call placed to pharmacy to refill test strips and lancets.   [facility] CM Short Term Goal #2 (0-30 days)  Member will report checking blood sugar daily within the next 4 weeks   [facility] CM Short Term Goal #2 Start Date  07/23/15 [Goal date reset]   [facility] CM Short Term Goal #2 Met Date  -- [goal not met]   Interventions for Short Term Goal #2  Educated member on correct use of glucose meter and supplies.  Educated on importance of self monitoring, enlisted assistance of friend to help with daily blood sugar checks    [facility] CM Care Plan Problem Three        Most Recent Value   Care Plan Problem Three  Elevated blood pressure   Role Documenting the Problem Three  Care Management Coordinator   Care Plan for Problem Three  Not Active     [facility] Long Term Goal (31-90) days  Member's blood pressure will be below target goal  withinthe next 31 days   [facility] Long Term Goal Start Date  07/23/15   [facility] Long Term Goal Met Date  08/20/15   Interventions for Problem Three Long Term Goal  Discussed the importance of maintaining blood pressure below target range (140/90), discussed diet (low sodium, heart healthy), re-educated member on use of blood pressure monitor with return demonstration. Assisted member with filling pill box with proper doses of daily medications.   [facility] CM Short Term Goal #1 (0-30 days)  Member will check blood pressure daily and record readings within the next 4 weeks   [facility] CM Short Term Goal #1 Start Date  07/23/15   [facility] CM Short Term Goal #1 Met Date  -- [Goal not met]   Interventions for Short Term Goal #1  Discussed the importance of maintaining blood pressure below target range (140/90), discussed diet (low sodium, heart healthy), re-educated member on use of blood pressure monitor with return demonstration      Lane, BSN, PCCN [facility] Care Management  Community Care Manager [phone]    

## 2015-08-23 ENCOUNTER — Encounter (HOSPITAL_COMMUNITY): Payer: Self-pay | Admitting: Emergency Medicine

## 2015-08-23 ENCOUNTER — Emergency Department (HOSPITAL_COMMUNITY): Payer: Commercial Managed Care - HMO

## 2015-08-23 ENCOUNTER — Emergency Department (HOSPITAL_COMMUNITY)
Admission: EM | Admit: 2015-08-23 | Discharge: 2015-08-24 | Disposition: A | Discharge status: Home / Self Care | Payer: Commercial Managed Care - HMO | Attending: Emergency Medicine | Admitting: Emergency Medicine

## 2015-08-23 DIAGNOSIS — Z862 Personal history of diseases of the blood and blood-forming organs and certain disorders involving the immune mechanism: Secondary | ICD-10-CM | POA: Diagnosis not present

## 2015-08-23 DIAGNOSIS — Y9389 Activity, other specified: Secondary | ICD-10-CM | POA: Insufficient documentation

## 2015-08-23 DIAGNOSIS — R4182 Altered mental status, unspecified: Secondary | ICD-10-CM | POA: Diagnosis not present

## 2015-08-23 DIAGNOSIS — I4892 Unspecified atrial flutter: Secondary | ICD-10-CM | POA: Insufficient documentation

## 2015-08-23 DIAGNOSIS — I10 Essential (primary) hypertension: Secondary | ICD-10-CM | POA: Diagnosis not present

## 2015-08-23 DIAGNOSIS — S79912A Unspecified injury of left hip, initial encounter: Secondary | ICD-10-CM | POA: Insufficient documentation

## 2015-08-23 DIAGNOSIS — Y998 Other external cause status: Secondary | ICD-10-CM | POA: Insufficient documentation

## 2015-08-23 DIAGNOSIS — Z7982 Long term (current) use of aspirin: Secondary | ICD-10-CM | POA: Insufficient documentation

## 2015-08-23 DIAGNOSIS — Z79899 Other long term (current) drug therapy: Secondary | ICD-10-CM | POA: Diagnosis not present

## 2015-08-23 DIAGNOSIS — Z8719 Personal history of other diseases of the digestive system: Secondary | ICD-10-CM | POA: Insufficient documentation

## 2015-08-23 DIAGNOSIS — J189 Pneumonia, unspecified organism: Secondary | ICD-10-CM | POA: Diagnosis not present

## 2015-08-23 DIAGNOSIS — G8929 Other chronic pain: Secondary | ICD-10-CM | POA: Diagnosis not present

## 2015-08-23 DIAGNOSIS — Z8701 Personal history of pneumonia (recurrent): Secondary | ICD-10-CM | POA: Insufficient documentation

## 2015-08-23 DIAGNOSIS — S299XXA Unspecified injury of thorax, initial encounter: Secondary | ICD-10-CM | POA: Diagnosis not present

## 2015-08-23 DIAGNOSIS — M25559 Pain in unspecified hip: Secondary | ICD-10-CM | POA: Diagnosis not present

## 2015-08-23 DIAGNOSIS — I480 Paroxysmal atrial fibrillation: Secondary | ICD-10-CM | POA: Diagnosis not present

## 2015-08-23 DIAGNOSIS — Z8673 Personal history of transient ischemic attack (TIA), and cerebral infarction without residual deficits: Secondary | ICD-10-CM | POA: Insufficient documentation

## 2015-08-23 DIAGNOSIS — S3993XA Unspecified injury of pelvis, initial encounter: Secondary | ICD-10-CM | POA: Diagnosis not present

## 2015-08-23 DIAGNOSIS — M1990 Unspecified osteoarthritis, unspecified site: Secondary | ICD-10-CM | POA: Diagnosis not present

## 2015-08-23 DIAGNOSIS — W050XXA Fall from non-moving wheelchair, initial encounter: Secondary | ICD-10-CM | POA: Insufficient documentation

## 2015-08-23 DIAGNOSIS — Z87891 Personal history of nicotine dependence: Secondary | ICD-10-CM | POA: Insufficient documentation

## 2015-08-23 DIAGNOSIS — E119 Type 2 diabetes mellitus without complications: Secondary | ICD-10-CM | POA: Diagnosis not present

## 2015-08-23 DIAGNOSIS — Z951 Presence of aortocoronary bypass graft: Secondary | ICD-10-CM | POA: Insufficient documentation

## 2015-08-23 DIAGNOSIS — Y9289 Other specified places as the place of occurrence of the external cause: Secondary | ICD-10-CM | POA: Diagnosis not present

## 2015-08-23 DIAGNOSIS — I2510 Atherosclerotic heart disease of native coronary artery without angina pectoris: Secondary | ICD-10-CM | POA: Insufficient documentation

## 2015-08-23 DIAGNOSIS — M25552 Pain in left hip: Secondary | ICD-10-CM | POA: Diagnosis not present

## 2015-08-23 DIAGNOSIS — T148 Other injury of unspecified body region: Secondary | ICD-10-CM | POA: Diagnosis not present

## 2015-08-23 DIAGNOSIS — W19XXXA Unspecified fall, initial encounter: Secondary | ICD-10-CM

## 2015-08-23 LAB — CK: CK TOTAL: 450 U/L — AB (ref 49–397)

## 2015-08-23 LAB — CBC WITH DIFFERENTIAL/PLATELET
BASOS ABS: 0 10*3/uL (ref 0.0–0.1)
Basophils Relative: 0 %
EOS ABS: 0.2 10*3/uL (ref 0.0–0.7)
EOS PCT: 2 %
HCT: 34.5 % — ABNORMAL LOW (ref 39.0–52.0)
Hemoglobin: 11.6 g/dL — ABNORMAL LOW (ref 13.0–17.0)
LYMPHS PCT: 21 %
Lymphs Abs: 1.9 10*3/uL (ref 0.7–4.0)
MCH: 31.8 pg (ref 26.0–34.0)
MCHC: 33.6 g/dL (ref 30.0–36.0)
MCV: 94.5 fL (ref 78.0–100.0)
MONO ABS: 0.9 10*3/uL (ref 0.1–1.0)
Monocytes Relative: 11 %
Neutro Abs: 5.9 10*3/uL (ref 1.7–7.7)
Neutrophils Relative %: 66 %
PLATELETS: 101 10*3/uL — AB (ref 150–400)
RBC: 3.65 MIL/uL — AB (ref 4.22–5.81)
RDW: 15.1 % (ref 11.5–15.5)
WBC: 8.9 10*3/uL (ref 4.0–10.5)

## 2015-08-23 LAB — URINALYSIS, ROUTINE W REFLEX MICROSCOPIC
Bilirubin Urine: NEGATIVE
Glucose, UA: NEGATIVE mg/dL
Ketones, ur: 15 mg/dL — AB
Leukocytes, UA: NEGATIVE
NITRITE: NEGATIVE
PH: 5.5 (ref 5.0–8.0)
Protein, ur: 30 mg/dL — AB
SPECIFIC GRAVITY, URINE: 1.019 (ref 1.005–1.030)

## 2015-08-23 LAB — RAPID URINE DRUG SCREEN, HOSP PERFORMED
Amphetamines: NOT DETECTED
BENZODIAZEPINES: NOT DETECTED
Barbiturates: NOT DETECTED
Cocaine: NOT DETECTED
OPIATES: NOT DETECTED
Tetrahydrocannabinol: NOT DETECTED

## 2015-08-23 LAB — URINE MICROSCOPIC-ADD ON: WBC UA: NONE SEEN WBC/hpf (ref 0–5)

## 2015-08-23 LAB — AMMONIA: AMMONIA: 16 umol/L (ref 9–35)

## 2015-08-23 LAB — CBG MONITORING, ED: Glucose-Capillary: 98 mg/dL (ref 65–99)

## 2015-08-23 LAB — ETHANOL

## 2015-08-23 NOTE — Progress Notes (Signed)
CSW met with patient at bedside. Patient was alert and oriented during assessment, but states that he does not rememberhow he fell. Per note, pt presents to [facility] due to falling out of his wheelchair. Also,note states that pt was found by neighbor.  Patient informed CSW that he lives home alone in [place]. Patient states that his daughter is his primary support, and that he also receives home health x3 a week. Patient states that this incident is the only time he has fallen in 3 months. Patient states if PT is needed he would prefer home health.  Patient states his daughter is his POA/ [name] [phone]   , LCSWA [phone] ED CSW 08/23/2015 11:24 PM      
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
## 2015-08-23 NOTE — ED Notes (Signed)
Bed: WA17 Expected date:  Expected time:  Means of arrival:  Comments: EMS 

## 2015-08-23 NOTE — ED Notes (Signed)
Pt from home via EMS. Per EMS, pt fell out of his wheelchair and was found by a neighbor. Per neighbor, pt was having VH yesterday, which is not normal for pt. Pt also had short term memory problems with EMS (not remembering being picked up off the floor minutes later). Pt smells strongly of urine and is tremulous at time of assessment.

## 2015-08-24 ENCOUNTER — Ambulatory Visit: Payer: Commercial Managed Care - HMO | Admitting: Internal Medicine

## 2015-08-24 DIAGNOSIS — R259 Unspecified abnormal involuntary movements: Secondary | ICD-10-CM | POA: Diagnosis not present

## 2015-08-24 LAB — COMPREHENSIVE METABOLIC PANEL
ALBUMIN: 3.6 g/dL (ref 3.5–5.0)
ALK PHOS: 83 U/L (ref 38–126)
ALT: 18 U/L (ref 17–63)
AST: 32 U/L (ref 15–41)
Anion gap: 12 (ref 5–15)
BILIRUBIN TOTAL: 1.5 mg/dL — AB (ref 0.3–1.2)
BUN: 26 mg/dL — AB (ref 6–20)
CO2: 24 mmol/L (ref 22–32)
CREATININE: 1.06 mg/dL (ref 0.61–1.24)
Calcium: 8.8 mg/dL — ABNORMAL LOW (ref 8.9–10.3)
Chloride: 107 mmol/L (ref 101–111)
GFR calc Af Amer: >60 mL/min (ref >60)
GFR calc non Af Amer: >60 mL/min (ref >60)
GLUCOSE: 91 mg/dL (ref 65–99)
POTASSIUM: 3 mmol/L — AB (ref 3.5–5.1)
Sodium: 143 mmol/L (ref 135–145)
TOTAL PROTEIN: 7.3 g/dL (ref 6.5–8.1)

## 2015-08-24 MED ORDER — POTASSIUM CHLORIDE CRYS ER 20 MEQ PO TBCR
40.0000 meq | EXTENDED_RELEASE_TABLET | Freq: Once | ORAL | Status: AC
  Administered 2015-08-24: 40 meq via ORAL
  Filled 2015-08-24: qty 2

## 2015-08-24 NOTE — ED Notes (Signed)
Pt left via [facility] 

## 2015-08-24 NOTE — ED Provider Notes (Signed)
Pt seen and evaluated.  Awake and alert.  Reassuring labs.  Sates that he "just falls sometimes".  No apparent injuries o exam.  No concerning diagnostics.  Attempting to contact his daughter to assure that she is at the house before DC.  Rolland PorterMark Jonne Rote, MD 08/24/15 [phone]

## 2015-08-24 NOTE — ED Notes (Signed)
Pt's daughter stated she will be at his home for [facility] to transport pt. [facility] notified of need for transport

## 2015-08-24 NOTE — Discharge Instructions (Signed)
Fall Prevention in the Home  Falls can cause injuries and can affect people from all age groups. There are many simple things that you can do to make your home safe and to help prevent falls. WHAT CAN I DO ON THE OUTSIDE OF MY HOME?  Regularly repair the edges of walkways and driveways and fix any cracks.  Remove high doorway thresholds.  Trim any shrubbery on the main path into your home.  Use bright outdoor lighting.  Clear walkways of debris and clutter, including tools and rocks.  Regularly check that handrails are securely fastened and in good repair. Both sides of any steps should have handrails.  Install guardrails along the edges of any raised decks or porches.  Have leaves, snow, and ice cleared regularly.  Use sand or salt on walkways during winter months.  In the garage, clean up any spills right away, including grease or oil spills. WHAT CAN I DO IN THE BATHROOM?  Use night lights.  Install grab bars by the toilet and in the tub and shower. Do not use towel bars as grab bars.  Use non-skid mats or decals on the floor of the tub or shower.  If you need to sit down while you are in the shower, use a plastic, non-slip stool..  Keep the floor dry. Immediately clean up any water that spills on the floor.  Remove soap buildup in the tub or shower on a regular basis.  Attach bath mats securely with double-sided non-slip rug tape.  Remove throw rugs and other tripping hazards from the floor. WHAT CAN I DO IN THE BEDROOM?  Use night lights.  Make sure that a bedside light is easy to reach.  Do not use oversized bedding that drapes onto the floor.  Have a firm chair that has side arms to use for getting dressed.  Remove throw rugs and other tripping hazards from the floor. WHAT CAN I DO IN THE KITCHEN?   Clean up any spills right away.  Avoid walking on wet floors.  Place frequently used items in easy-to-reach places.  If you need to reach for something  above you, use a sturdy step stool that has a grab bar.  Keep electrical cables out of the way.  Do not use floor polish or wax that makes floors slippery. If you have to use wax, make sure that it is non-skid floor wax.  Remove throw rugs and other tripping hazards from the floor. WHAT CAN I DO IN THE STAIRWAYS?  Do not leave any items on the stairs.  Make sure that there are handrails on both sides of the stairs. Fix handrails that are broken or loose. Make sure that handrails are as long as the stairways.  Check any carpeting to make sure that it is firmly attached to the stairs. Fix any carpet that is loose or worn.  Avoid having throw rugs at the top or bottom of stairways, or secure the rugs with carpet tape to prevent them from moving.  Make sure that you have a light switch at the top of the stairs and the bottom of the stairs. If you do not have them, have them installed. WHAT ARE SOME OTHER FALL PREVENTION TIPS?  Wear closed-toe shoes that fit well and support your feet. Wear shoes that have rubber soles or low heels.  When you use a stepladder, make sure that it is completely opened and that the sides are firmly locked. Have someone hold the ladder while you   are using it. Do not climb a closed stepladder.  Add color or contrast paint or tape to grab bars and handrails in your home. Place contrasting color strips on the first and last steps.  Use mobility aids as needed, such as canes, walkers, scooters, and crutches.  Turn on lights if it is dark. Replace any light bulbs that burn out.  Set up furniture so that there are clear paths. Keep the furniture in the same spot.  Fix any uneven floor surfaces.  Choose a carpet design that does not hide the edge of steps of a stairway.  Be aware of any and all pets.  Review your medicines with your healthcare provider. Some medicines can cause dizziness or changes in blood pressure, which increase your risk of falling. Talk  with your health care provider about other ways that you can decrease your risk of falls. This may include working with a physical therapist or trainer to improve your strength, balance, and endurance.   This information is not intended to replace advice given to you by your health care provider. Make sure you discuss any questions you have with your health care provider.   Document Released: 03/28/2002 Document Revised: 08/22/2014 Document Reviewed: 05/12/2014 Elsevier Interactive Patient Education 2016 Elsevier Inc.  

## 2015-08-24 NOTE — ED Notes (Signed)
Pt's daughter did not answer her phone. Will try again in a few minutes

## 2015-09-12 ENCOUNTER — Telehealth: Payer: Self-pay | Admitting: Internal Medicine

## 2015-09-12 NOTE — Telephone Encounter (Signed)
Please see message. °

## 2015-09-12 NOTE — Telephone Encounter (Signed)
Johnny LimaAngela Thomas with [facility] went out to see pt today.  Pt had another fall last night.  Has a 1/4 inch abrasion on his head. Marylene Landngela tried to get pt to go to ED, or come in to see Dr Kirtland BouchardK, pt is refusing.  Marylene Landngela would like Dr Kirtland BouchardK to know pt is having falls. Pt just seen in ED on 5/5 for a fall. Pt has not been seen in our office since 03/2015. Has cancelled all appointments. Marylene Landngela would like us to reach out to pt and try and convince him to come in, because he will not listen to her.

## 2015-09-12 NOTE — Telephone Encounter (Signed)
Please call patient and offer a return office visit

## 2015-09-13 NOTE — Telephone Encounter (Signed)
Tried to contact pt on home and mobile, no answer unable to leave message. Will try again later.

## 2015-09-14 NOTE — Telephone Encounter (Signed)
Left message on voicemail to call office. Pt needs an appt to see Dr.K.

## 2015-09-19 NOTE — Telephone Encounter (Signed)
Left message on voicemail to call office. Pt needs an appt to see Dr. KKirtland Bouchard

## 2015-09-21 ENCOUNTER — Other Ambulatory Visit: Payer: Self-pay | Admitting: *Deleted

## 2015-09-21 ENCOUNTER — Emergency Department (HOSPITAL_COMMUNITY): Payer: Commercial Managed Care - HMO

## 2015-09-21 ENCOUNTER — Encounter (HOSPITAL_COMMUNITY): Payer: Self-pay | Admitting: *Deleted

## 2015-09-21 ENCOUNTER — Inpatient Hospital Stay (HOSPITAL_COMMUNITY)
Admission: EM | Admit: 2015-09-21 | Discharge: 2015-09-26 | DRG: 193 | Disposition: A | Discharge status: Skilled Nursing Facility | Payer: Commercial Managed Care - HMO | Attending: Internal Medicine | Admitting: Internal Medicine

## 2015-09-21 DIAGNOSIS — L89150 Pressure ulcer of sacral region, unstageable: Secondary | ICD-10-CM | POA: Diagnosis not present

## 2015-09-21 DIAGNOSIS — I426 Alcoholic cardiomyopathy: Secondary | ICD-10-CM | POA: Diagnosis not present

## 2015-09-21 DIAGNOSIS — E1151 Type 2 diabetes mellitus with diabetic peripheral angiopathy without gangrene: Secondary | ICD-10-CM

## 2015-09-21 DIAGNOSIS — E1165 Type 2 diabetes mellitus with hyperglycemia: Secondary | ICD-10-CM | POA: Diagnosis not present

## 2015-09-21 DIAGNOSIS — F10220 Alcohol dependence with intoxication, uncomplicated: Secondary | ICD-10-CM | POA: Diagnosis not present

## 2015-09-21 DIAGNOSIS — F411 Generalized anxiety disorder: Secondary | ICD-10-CM | POA: Diagnosis not present

## 2015-09-21 DIAGNOSIS — R5381 Other malaise: Secondary | ICD-10-CM

## 2015-09-21 DIAGNOSIS — K089 Disorder of teeth and supporting structures, unspecified: Secondary | ICD-10-CM

## 2015-09-21 DIAGNOSIS — Z961 Presence of intraocular lens: Secondary | ICD-10-CM | POA: Diagnosis present

## 2015-09-21 DIAGNOSIS — M6281 Muscle weakness (generalized): Secondary | ICD-10-CM | POA: Diagnosis not present

## 2015-09-21 DIAGNOSIS — Z794 Long term (current) use of insulin: Secondary | ICD-10-CM

## 2015-09-21 DIAGNOSIS — F10231 Alcohol dependence with withdrawal delirium: Secondary | ICD-10-CM | POA: Diagnosis present

## 2015-09-21 DIAGNOSIS — R0602 Shortness of breath: Secondary | ICD-10-CM

## 2015-09-21 DIAGNOSIS — L89610 Pressure ulcer of right heel, unstageable: Secondary | ICD-10-CM | POA: Diagnosis present

## 2015-09-21 DIAGNOSIS — J189 Pneumonia, unspecified organism: Secondary | ICD-10-CM | POA: Diagnosis not present

## 2015-09-21 DIAGNOSIS — E440 Moderate protein-calorie malnutrition: Secondary | ICD-10-CM | POA: Diagnosis present

## 2015-09-21 DIAGNOSIS — G8929 Other chronic pain: Secondary | ICD-10-CM | POA: Diagnosis present

## 2015-09-21 DIAGNOSIS — Z8249 Family history of ischemic heart disease and other diseases of the circulatory system: Secondary | ICD-10-CM | POA: Diagnosis not present

## 2015-09-21 DIAGNOSIS — Z833 Family history of diabetes mellitus: Secondary | ICD-10-CM | POA: Diagnosis not present

## 2015-09-21 DIAGNOSIS — R1314 Dysphagia, pharyngoesophageal phase: Secondary | ICD-10-CM | POA: Diagnosis not present

## 2015-09-21 DIAGNOSIS — Z87891 Personal history of nicotine dependence: Secondary | ICD-10-CM | POA: Diagnosis not present

## 2015-09-21 DIAGNOSIS — R4182 Altered mental status, unspecified: Secondary | ICD-10-CM | POA: Diagnosis not present

## 2015-09-21 DIAGNOSIS — Z6824 Body mass index (BMI) 24.0-24.9, adult: Secondary | ICD-10-CM | POA: Diagnosis not present

## 2015-09-21 DIAGNOSIS — Z8673 Personal history of transient ischemic attack (TIA), and cerebral infarction without residual deficits: Secondary | ICD-10-CM | POA: Diagnosis not present

## 2015-09-21 DIAGNOSIS — R0902 Hypoxemia: Secondary | ICD-10-CM | POA: Diagnosis present

## 2015-09-21 DIAGNOSIS — R627 Adult failure to thrive: Secondary | ICD-10-CM | POA: Diagnosis present

## 2015-09-21 DIAGNOSIS — R531 Weakness: Secondary | ICD-10-CM

## 2015-09-21 DIAGNOSIS — S299XXA Unspecified injury of thorax, initial encounter: Secondary | ICD-10-CM | POA: Diagnosis not present

## 2015-09-21 DIAGNOSIS — E785 Hyperlipidemia, unspecified: Secondary | ICD-10-CM | POA: Diagnosis present

## 2015-09-21 DIAGNOSIS — R000 Tachycardia, unspecified: Secondary | ICD-10-CM | POA: Diagnosis not present

## 2015-09-21 DIAGNOSIS — G9340 Encephalopathy, unspecified: Secondary | ICD-10-CM | POA: Diagnosis present

## 2015-09-21 DIAGNOSIS — Z951 Presence of aortocoronary bypass graft: Secondary | ICD-10-CM | POA: Diagnosis not present

## 2015-09-21 DIAGNOSIS — D638 Anemia in other chronic diseases classified elsewhere: Secondary | ICD-10-CM | POA: Diagnosis present

## 2015-09-21 DIAGNOSIS — I4892 Unspecified atrial flutter: Secondary | ICD-10-CM | POA: Diagnosis present

## 2015-09-21 DIAGNOSIS — D899 Disorder involving the immune mechanism, unspecified: Secondary | ICD-10-CM | POA: Diagnosis present

## 2015-09-21 DIAGNOSIS — S199XXA Unspecified injury of neck, initial encounter: Secondary | ICD-10-CM | POA: Diagnosis not present

## 2015-09-21 DIAGNOSIS — R251 Tremor, unspecified: Secondary | ICD-10-CM | POA: Diagnosis not present

## 2015-09-21 DIAGNOSIS — S0990XA Unspecified injury of head, initial encounter: Secondary | ICD-10-CM | POA: Diagnosis not present

## 2015-09-21 DIAGNOSIS — I10 Essential (primary) hypertension: Secondary | ICD-10-CM | POA: Diagnosis present

## 2015-09-21 DIAGNOSIS — F10120 Alcohol abuse with intoxication, uncomplicated: Secondary | ICD-10-CM | POA: Diagnosis not present

## 2015-09-21 DIAGNOSIS — L89620 Pressure ulcer of left heel, unstageable: Secondary | ICD-10-CM | POA: Diagnosis present

## 2015-09-21 DIAGNOSIS — F10929 Alcohol use, unspecified with intoxication, unspecified: Secondary | ICD-10-CM | POA: Diagnosis present

## 2015-09-21 DIAGNOSIS — J449 Chronic obstructive pulmonary disease, unspecified: Secondary | ICD-10-CM | POA: Diagnosis not present

## 2015-09-21 DIAGNOSIS — L8990 Pressure ulcer of unspecified site, unspecified stage: Secondary | ICD-10-CM | POA: Insufficient documentation

## 2015-09-21 DIAGNOSIS — N400 Enlarged prostate without lower urinary tract symptoms: Secondary | ICD-10-CM | POA: Diagnosis present

## 2015-09-21 DIAGNOSIS — I480 Paroxysmal atrial fibrillation: Secondary | ICD-10-CM | POA: Diagnosis present

## 2015-09-21 DIAGNOSIS — I517 Cardiomegaly: Secondary | ICD-10-CM | POA: Diagnosis not present

## 2015-09-21 DIAGNOSIS — E46 Unspecified protein-calorie malnutrition: Secondary | ICD-10-CM | POA: Diagnosis present

## 2015-09-21 DIAGNOSIS — D696 Thrombocytopenia, unspecified: Secondary | ICD-10-CM | POA: Diagnosis present

## 2015-09-21 DIAGNOSIS — F1020 Alcohol dependence, uncomplicated: Secondary | ICD-10-CM | POA: Diagnosis present

## 2015-09-21 DIAGNOSIS — R32 Unspecified urinary incontinence: Secondary | ICD-10-CM | POA: Diagnosis not present

## 2015-09-21 DIAGNOSIS — Z9181 History of falling: Secondary | ICD-10-CM | POA: Diagnosis not present

## 2015-09-21 LAB — BASIC METABOLIC PANEL
ANION GAP: 7 (ref 5–15)
BUN: 15 mg/dL (ref 6–20)
CALCIUM: 8.6 mg/dL — AB (ref 8.9–10.3)
CO2: 23 mmol/L (ref 22–32)
Chloride: 106 mmol/L (ref 101–111)
Creatinine, Ser: 0.95 mg/dL (ref 0.61–1.24)
GFR calc Af Amer: >60 mL/min (ref >60)
GFR calc non Af Amer: >60 mL/min (ref >60)
GLUCOSE: 180 mg/dL — AB (ref 65–99)
Potassium: 4.3 mmol/L (ref 3.5–5.1)
Sodium: 136 mmol/L (ref 135–145)

## 2015-09-21 LAB — URINALYSIS, ROUTINE W REFLEX MICROSCOPIC
BILIRUBIN URINE: NEGATIVE
Glucose, UA: NEGATIVE mg/dL
Ketones, ur: NEGATIVE mg/dL
Leukocytes, UA: NEGATIVE
Nitrite: NEGATIVE
PH: 5 (ref 5.0–8.0)
Protein, ur: 100 mg/dL — AB
SPECIFIC GRAVITY, URINE: 1.017 (ref 1.005–1.030)

## 2015-09-21 LAB — CBC
HCT: 31.3 % — ABNORMAL LOW (ref 39.0–52.0)
HEMOGLOBIN: 10.7 g/dL — AB (ref 13.0–17.0)
MCH: 32.4 pg (ref 26.0–34.0)
MCHC: 34.2 g/dL (ref 30.0–36.0)
MCV: 94.8 fL (ref 78.0–100.0)
Platelets: 99 10*3/uL — ABNORMAL LOW (ref 150–400)
RBC: 3.3 MIL/uL — ABNORMAL LOW (ref 4.22–5.81)
RDW: 14.5 % (ref 11.5–15.5)
WBC: 10.6 10*3/uL — ABNORMAL HIGH (ref 4.0–10.5)

## 2015-09-21 LAB — HEPATIC FUNCTION PANEL
ALBUMIN: 3.5 g/dL (ref 3.5–5.0)
ALK PHOS: 108 U/L (ref 38–126)
ALT: 18 U/L (ref 17–63)
AST: 27 U/L (ref 15–41)
BILIRUBIN TOTAL: 0.7 mg/dL (ref 0.3–1.2)
Bilirubin, Direct: 0.2 mg/dL (ref 0.1–0.5)
Indirect Bilirubin: 0.5 mg/dL (ref 0.3–0.9)
Total Protein: 7.8 g/dL (ref 6.5–8.1)

## 2015-09-21 LAB — URINE MICROSCOPIC-ADD ON: WBC, UA: NONE SEEN WBC/hpf (ref 0–5)

## 2015-09-21 LAB — POC OCCULT BLOOD, ED: Fecal Occult Bld: NEGATIVE

## 2015-09-21 LAB — CBG MONITORING, ED: Glucose-Capillary: 167 mg/dL — ABNORMAL HIGH (ref 65–99)

## 2015-09-21 LAB — ETHANOL: Alcohol, Ethyl (B): 242 mg/dL — ABNORMAL HIGH (ref <5)

## 2015-09-21 MED ORDER — SODIUM CHLORIDE 0.9 % IV BOLUS (SEPSIS)
2000.0000 mL | Freq: Once | INTRAVENOUS | Status: AC
  Administered 2015-09-21: 2000 mL via INTRAVENOUS

## 2015-09-21 NOTE — ED Notes (Signed)
Patient transported to CT 

## 2015-09-21 NOTE — Care Management Note (Addendum)
Case Management Note  Patient Details  Name: Johnny [name] MRN: [id number] Date of Birth: June 25, 1934  Subjective/Objective:    Patient presents from home to ED post fall and weakness.               Action/Plan: EDCM spoke to patient at bedside.  Patient reports he has friends and neighbors who check in on him daily.  He reports his daughters check in on him often during the week.  Patient also reports he has an aide who comes out M-W-F.  Patient is active with [facility].  EDCM will alert [facility] of patient's ED visit.  [facility].  [facility].  Per chart review, patient is being seen by [facility].  Patient reports, "They come out and fix things in my home."  [facility] at 1045.  Patient reports his daughter will take him to his appointment.  Patient has walker, wheelchair and tub transfer bench at home.  [facility] if he has ever considered going to a nursing facility?  Patient stated, "I don't want to go."  [facility] at this time.  Discussed patient with EDP who placed home health orders for home health RN, PT, OT, aide and social worker.  [facility] with confirmation of receipt.   Expected Discharge Date: Per EDP/PA assessment                 Expected Discharge Plan:  Home w Home Health Services  In-House Referral:     Discharge planning Services  CM Consult  Post Acute Care Choice:  Home Health Choice offered to:  Patient  DME Arranged:   (none required per patient) DME Agency:     HH Arranged:  RN, PT, OT, Nurse's Aide, Social Work [facility]  Status of Service:  Completed, signed off  Medicare Important Message Given:    Date Medicare IM Given:    Medicare IM give by:    Date Additional Medicare IM Given:    Additional Medicare Important Message give by:     If  discussed at Long Length of Stay Meetings, dates discussed:    Additional CommentsRadford Pax:  Johnny Harm, RN 09/21/2015, 10:09 PM
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
## 2015-09-21 NOTE — ED Notes (Signed)
Bed: [id number] Expected date:  Expected time:  Means of arrival:  Comments: EMS 80yo M increasing weakness / ETOH

## 2015-09-21 NOTE — ED Notes (Signed)
Pt states he fell 2 days ago. He has a healing wound to the R side of his forehead. He states he did not seek medical attention when he fell. He denies being on blood thinners.

## 2015-09-21 NOTE — ED Notes (Signed)
Per EMS, pt complains of weakness and tremors for 1 week. Pt states he has recently started drinking again, states he drank 1 pint of alcohol today. Pt could not stand upon EMS arrival, states he usually uses a walker and has a caregiver come by a few times a week. CBG 188.

## 2015-09-21 NOTE — Patient Outreach (Signed)
Notified by A. Maisie Fushomas, who made home visit with the [facility], that member had experienced another fall last week.  Also noted in chart that several unsuccessful attempts has been made to schedule an appointment with PCP.  Call placed to member to follow up on current health status and need for PCP appointment.  Member state that he is "fine."  This care manager inquired about the member's frequent falls and informed him that his PCP office has been trying to contact him for an appointment (last visit in December 2016).  He denies falling since last week and state that he has not been able to talk to the PCP.  Advised on importance of appointment permission granted to contact office to schedule.  Noted that member's speech was slurred and he was becoming agitated with questions.  This care manager inquired about member's last alcoholic drink, he states "that's enough of this.  Call back tomorrow" and abruptly ends call.  Call then placed to PCP office, appointment scheduled for 6/16 at 1045 (the next available).  Informed of multiple missed appointments in the past, receptionist explained importance of keeping this appointment.  Call placed back to member to inform him of appointment details.  He state that his daughter Jasmine DecemberSharon will take him.  Encouraged to write down details, he states he will remember.  Family/friend heard in background, advised to have them write down information, again he refuses.  This care manager requested to have Sharon's contact information to call and discuss member's condition and appointment, he refuses to provide information.  Informed him that other daughter, Oran ReinSuprena, information was readily available, requested permission to contact her instead of Jasmine DecemberSharon to provided information, he states "no, don't call her."  Advised again on importance of this appointment and informed that this care manager will attend appointment.  He states "yeah, ok" and again abruptly ends  call.  Will follow up within the next 2 weeks.  Kemper DurieMonica Savera Donson, [place]RN, MSN [facility]  [facility] Care Manager [phone]

## 2015-09-22 ENCOUNTER — Emergency Department (HOSPITAL_COMMUNITY): Payer: Commercial Managed Care - HMO

## 2015-09-22 ENCOUNTER — Encounter (HOSPITAL_COMMUNITY): Payer: Self-pay | Admitting: Family Medicine

## 2015-09-22 DIAGNOSIS — E785 Hyperlipidemia, unspecified: Secondary | ICD-10-CM | POA: Diagnosis present

## 2015-09-22 DIAGNOSIS — F10231 Alcohol dependence with withdrawal delirium: Secondary | ICD-10-CM | POA: Diagnosis present

## 2015-09-22 DIAGNOSIS — J189 Pneumonia, unspecified organism: Secondary | ICD-10-CM | POA: Diagnosis present

## 2015-09-22 DIAGNOSIS — E1165 Type 2 diabetes mellitus with hyperglycemia: Secondary | ICD-10-CM | POA: Diagnosis present

## 2015-09-22 DIAGNOSIS — I4892 Unspecified atrial flutter: Secondary | ICD-10-CM

## 2015-09-22 DIAGNOSIS — L89620 Pressure ulcer of left heel, unstageable: Secondary | ICD-10-CM | POA: Diagnosis present

## 2015-09-22 DIAGNOSIS — Z6824 Body mass index (BMI) 24.0-24.9, adult: Secondary | ICD-10-CM | POA: Diagnosis not present

## 2015-09-22 DIAGNOSIS — L8990 Pressure ulcer of unspecified site, unspecified stage: Secondary | ICD-10-CM | POA: Insufficient documentation

## 2015-09-22 DIAGNOSIS — Z961 Presence of intraocular lens: Secondary | ICD-10-CM | POA: Diagnosis present

## 2015-09-22 DIAGNOSIS — Z794 Long term (current) use of insulin: Secondary | ICD-10-CM | POA: Diagnosis not present

## 2015-09-22 DIAGNOSIS — N400 Enlarged prostate without lower urinary tract symptoms: Secondary | ICD-10-CM | POA: Diagnosis present

## 2015-09-22 DIAGNOSIS — F10220 Alcohol dependence with intoxication, uncomplicated: Secondary | ICD-10-CM | POA: Diagnosis not present

## 2015-09-22 DIAGNOSIS — Z8673 Personal history of transient ischemic attack (TIA), and cerebral infarction without residual deficits: Secondary | ICD-10-CM | POA: Diagnosis not present

## 2015-09-22 DIAGNOSIS — D899 Disorder involving the immune mechanism, unspecified: Secondary | ICD-10-CM | POA: Diagnosis present

## 2015-09-22 DIAGNOSIS — R0902 Hypoxemia: Secondary | ICD-10-CM | POA: Diagnosis present

## 2015-09-22 DIAGNOSIS — R627 Adult failure to thrive: Secondary | ICD-10-CM | POA: Diagnosis present

## 2015-09-22 DIAGNOSIS — D638 Anemia in other chronic diseases classified elsewhere: Secondary | ICD-10-CM | POA: Diagnosis present

## 2015-09-22 DIAGNOSIS — I426 Alcoholic cardiomyopathy: Secondary | ICD-10-CM | POA: Diagnosis present

## 2015-09-22 DIAGNOSIS — I10 Essential (primary) hypertension: Secondary | ICD-10-CM | POA: Diagnosis present

## 2015-09-22 DIAGNOSIS — E440 Moderate protein-calorie malnutrition: Secondary | ICD-10-CM | POA: Diagnosis present

## 2015-09-22 DIAGNOSIS — E1151 Type 2 diabetes mellitus with diabetic peripheral angiopathy without gangrene: Secondary | ICD-10-CM | POA: Diagnosis present

## 2015-09-22 DIAGNOSIS — D696 Thrombocytopenia, unspecified: Secondary | ICD-10-CM | POA: Diagnosis present

## 2015-09-22 DIAGNOSIS — F10120 Alcohol abuse with intoxication, uncomplicated: Secondary | ICD-10-CM | POA: Diagnosis not present

## 2015-09-22 DIAGNOSIS — G8929 Other chronic pain: Secondary | ICD-10-CM | POA: Diagnosis present

## 2015-09-22 DIAGNOSIS — G9340 Encephalopathy, unspecified: Secondary | ICD-10-CM | POA: Diagnosis present

## 2015-09-22 DIAGNOSIS — Z8249 Family history of ischemic heart disease and other diseases of the circulatory system: Secondary | ICD-10-CM | POA: Diagnosis not present

## 2015-09-22 DIAGNOSIS — Z87891 Personal history of nicotine dependence: Secondary | ICD-10-CM | POA: Diagnosis not present

## 2015-09-22 DIAGNOSIS — L89610 Pressure ulcer of right heel, unstageable: Secondary | ICD-10-CM | POA: Diagnosis present

## 2015-09-22 DIAGNOSIS — Z833 Family history of diabetes mellitus: Secondary | ICD-10-CM | POA: Diagnosis not present

## 2015-09-22 DIAGNOSIS — I480 Paroxysmal atrial fibrillation: Secondary | ICD-10-CM | POA: Diagnosis present

## 2015-09-22 DIAGNOSIS — Z951 Presence of aortocoronary bypass graft: Secondary | ICD-10-CM | POA: Diagnosis not present

## 2015-09-22 DIAGNOSIS — L89150 Pressure ulcer of sacral region, unstageable: Secondary | ICD-10-CM | POA: Diagnosis present

## 2015-09-22 LAB — GLUCOSE, CAPILLARY
Glucose-Capillary: 193 mg/dL — ABNORMAL HIGH (ref 65–99)
Glucose-Capillary: 146 mg/dL — ABNORMAL HIGH (ref 65–99)
Glucose-Capillary: 131 mg/dL — ABNORMAL HIGH (ref 65–99)

## 2015-09-22 LAB — STREP PNEUMONIAE URINARY ANTIGEN: Strep Pneumo Urinary Antigen: NEGATIVE

## 2015-09-22 LAB — LACTIC ACID, PLASMA: Lactic Acid, Venous: 1.9 mmol/L (ref 0.5–2.0)

## 2015-09-22 LAB — PROCALCITONIN

## 2015-09-22 MED ORDER — METOPROLOL TARTRATE 50 MG PO TABS
75.0000 mg | ORAL_TABLET | Freq: Two times a day (BID) | ORAL | Status: DC
  Administered 2015-09-22 – 2015-09-26 (×9): 75 mg via ORAL
  Filled 2015-09-22 (×9): qty 1

## 2015-09-22 MED ORDER — VITAMIN B-1 100 MG PO TABS
100.0000 mg | ORAL_TABLET | Freq: Every day | ORAL | Status: DC
  Administered 2015-09-22 – 2015-09-26 (×5): 100 mg via ORAL
  Filled 2015-09-22 (×5): qty 1

## 2015-09-22 MED ORDER — HYDRALAZINE HCL 20 MG/ML IJ SOLN
5.0000 mg | Freq: Four times a day (QID) | INTRAMUSCULAR | Status: DC | PRN
  Administered 2015-09-22: 5 mg via INTRAVENOUS
  Filled 2015-09-22: qty 1

## 2015-09-22 MED ORDER — LEVOFLOXACIN IN D5W 750 MG/150ML IV SOLN
750.0000 mg | Freq: Once | INTRAVENOUS | Status: AC
  Administered 2015-09-22: 750 mg via INTRAVENOUS
  Filled 2015-09-22: qty 150

## 2015-09-22 MED ORDER — SODIUM CHLORIDE 0.9 % IV BOLUS (SEPSIS)
1000.0000 mL | Freq: Once | INTRAVENOUS | Status: AC
  Administered 2015-09-22: 1000 mL via INTRAVENOUS

## 2015-09-22 MED ORDER — LEVOFLOXACIN 500 MG PO TABS: 500.0000 mg | ORAL_TABLET | Freq: Once | ORAL | Status: DC

## 2015-09-22 MED ORDER — LORAZEPAM 2 MG/ML IJ SOLN
1.0000 mg | Freq: Once | INTRAMUSCULAR | Status: AC
  Administered 2015-09-22: 1 mg via INTRAVENOUS
  Filled 2015-09-22: qty 1

## 2015-09-22 MED ORDER — ADULT MULTIVITAMIN W/MINERALS CH
1.0000 | ORAL_TABLET | Freq: Every day | ORAL | Status: DC
  Administered 2015-09-22 – 2015-09-26 (×5): 1 via ORAL
  Filled 2015-09-22 (×5): qty 1

## 2015-09-22 MED ORDER — ADULT MULTIVITAMIN W/MINERALS CH: 1.0000 | ORAL_TABLET | Freq: Every day | ORAL | Status: DC

## 2015-09-22 MED ORDER — ENSURE ENLIVE PO LIQD
237.0000 mL | Freq: Two times a day (BID) | ORAL | Status: DC
  Administered 2015-09-22 – 2015-09-26 (×6): 237 mL via ORAL

## 2015-09-22 MED ORDER — INSULIN ASPART 100 UNIT/ML ~~LOC~~ SOLN: 0.0000 [IU] | Freq: Every day | SUBCUTANEOUS | Status: DC

## 2015-09-22 MED ORDER — TAMSULOSIN HCL 0.4 MG PO CAPS
0.4000 mg | ORAL_CAPSULE | Freq: Every day | ORAL | Status: DC
  Administered 2015-09-22 – 2015-09-26 (×5): 0.4 mg via ORAL
  Filled 2015-09-22 (×5): qty 1

## 2015-09-22 MED ORDER — LORAZEPAM 2 MG/ML IJ SOLN
1.0000 mg | Freq: Four times a day (QID) | INTRAMUSCULAR | Status: DC | PRN
  Administered 2015-09-22 – 2015-09-24 (×4): 1 mg via INTRAVENOUS
  Filled 2015-09-22 (×4): qty 1

## 2015-09-22 MED ORDER — LEVOFLOXACIN IN D5W 750 MG/150ML IV SOLN
750.0000 mg | INTRAVENOUS | Status: DC
  Administered 2015-09-23: 750 mg via INTRAVENOUS
  Filled 2015-09-22: qty 150

## 2015-09-22 MED ORDER — ENOXAPARIN SODIUM 40 MG/0.4ML ~~LOC~~ SOLN: 40.0000 mg | SUBCUTANEOUS | Status: DC

## 2015-09-22 MED ORDER — SODIUM CHLORIDE 0.9 % IV SOLN
INTRAVENOUS | Status: AC
  Administered 2015-09-22: 09:00:00 via INTRAVENOUS

## 2015-09-22 MED ORDER — ACETAMINOPHEN 325 MG PO TABS: 650.0000 mg | ORAL_TABLET | Freq: Four times a day (QID) | ORAL | Status: DC | PRN

## 2015-09-22 MED ORDER — ASPIRIN EC 81 MG PO TBEC
81.0000 mg | DELAYED_RELEASE_TABLET | Freq: Every day | ORAL | Status: DC
  Administered 2015-09-22 – 2015-09-26 (×5): 81 mg via ORAL
  Filled 2015-09-22 (×5): qty 1

## 2015-09-22 MED ORDER — ACETAMINOPHEN 650 MG RE SUPP: 650.0000 mg | Freq: Four times a day (QID) | RECTAL | Status: DC | PRN

## 2015-09-22 MED ORDER — IOPAMIDOL (ISOVUE-370) INJECTION 76%
100.0000 mL | Freq: Once | INTRAVENOUS | Status: AC | PRN
  Administered 2015-09-22: 100 mL via INTRAVENOUS

## 2015-09-22 MED ORDER — THIAMINE HCL 100 MG/ML IJ SOLN: 100.0000 mg | Freq: Every day | INTRAMUSCULAR | Status: DC

## 2015-09-22 MED ORDER — LORAZEPAM 1 MG PO TABS
1.0000 mg | ORAL_TABLET | Freq: Four times a day (QID) | ORAL | Status: DC | PRN
  Administered 2015-09-24 (×2): 1 mg via ORAL
  Filled 2015-09-22 (×2): qty 1

## 2015-09-22 MED ORDER — ALBUTEROL SULFATE (2.5 MG/3ML) 0.083% IN NEBU: 2.5000 mg | INHALATION_SOLUTION | RESPIRATORY_TRACT | Status: DC | PRN

## 2015-09-22 MED ORDER — INSULIN ASPART 100 UNIT/ML ~~LOC~~ SOLN
0.0000 [IU] | Freq: Three times a day (TID) | SUBCUTANEOUS | Status: DC
  Administered 2015-09-22: 3 [IU] via SUBCUTANEOUS
  Administered 2015-09-22: 2 [IU] via SUBCUTANEOUS
  Administered 2015-09-23 – 2015-09-24 (×3): 3 [IU] via SUBCUTANEOUS
  Administered 2015-09-24: 2 [IU] via SUBCUTANEOUS
  Administered 2015-09-24: 3 [IU] via SUBCUTANEOUS
  Administered 2015-09-25: 5 [IU] via SUBCUTANEOUS
  Administered 2015-09-25 – 2015-09-26 (×3): 2 [IU] via SUBCUTANEOUS
  Administered 2015-09-26: 3 [IU] via SUBCUTANEOUS

## 2015-09-22 MED ORDER — SODIUM CHLORIDE 0.9 % IV SOLN
INTRAVENOUS | Status: DC
  Administered 2015-09-22 – 2015-09-23 (×3): via INTRAVENOUS

## 2015-09-22 MED ORDER — FOLIC ACID 1 MG PO TABS
1.0000 mg | ORAL_TABLET | Freq: Every day | ORAL | Status: DC
  Administered 2015-09-22 – 2015-09-26 (×5): 1 mg via ORAL
  Filled 2015-09-22 (×5): qty 1

## 2015-09-22 NOTE — ED Notes (Signed)
Pt unable to stand even with staff assistance---- pt tends to be very stiff and tremors to upper extremities so severe that pt is unable to keep his hands steady on the walker.

## 2015-09-22 NOTE — ED Notes (Signed)
Patient transported to CT 

## 2015-09-22 NOTE — Progress Notes (Signed)
Patient vomited up his breakfast and some of his meds this morning.
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
## 2015-09-22 NOTE — ED Provider Notes (Signed)
  Physical Exam  BP 148/67 mmHg  Pulse 78  Temp(Src) 99.6 F (37.6 C) (Oral)  Resp 21  SpO2 96%  Physical Exam  ED Course  Procedures  MDM Patient care assumed at 1:40 am. Patient has weakness, fall. Does drink alcohol at baseline. ETOH 242. desat to 85-89% on RA when sleeping. CT angio showed LLL pneumonia. Ordered levaquin. Tried to ambulate patient, patient was unsteady and felt weak. Will admit for pneumonia.   Richardean Canalavid H Yao, MD 09/22/15 [id number]

## 2015-09-22 NOTE — Progress Notes (Signed)
Paged Dr. Maryfrances Bunnellanford because of elevated BP 207/77. MD stated he will place PRN orders for BP. Will await orders.

## 2015-09-23 LAB — COMPREHENSIVE METABOLIC PANEL
ALT: 14 U/L — AB (ref 17–63)
ANION GAP: 10 (ref 5–15)
AST: 24 U/L (ref 15–41)
Albumin: 2.9 g/dL — ABNORMAL LOW (ref 3.5–5.0)
Alkaline Phosphatase: 95 U/L (ref 38–126)
BUN: 13 mg/dL (ref 6–20)
CHLORIDE: 99 mmol/L — AB (ref 101–111)
CO2: 24 mmol/L (ref 22–32)
Calcium: 8.4 mg/dL — ABNORMAL LOW (ref 8.9–10.3)
Creatinine, Ser: 0.86 mg/dL (ref 0.61–1.24)
GFR calc non Af Amer: >60 mL/min (ref >60)
Glucose, Bld: 110 mg/dL — ABNORMAL HIGH (ref 65–99)
Potassium: 3.9 mmol/L (ref 3.5–5.1)
SODIUM: 133 mmol/L — AB (ref 135–145)
Total Bilirubin: 1.9 mg/dL — ABNORMAL HIGH (ref 0.3–1.2)
Total Protein: 7.1 g/dL (ref 6.5–8.1)

## 2015-09-23 LAB — CBC
HCT: 31.8 % — ABNORMAL LOW (ref 39.0–52.0)
Hemoglobin: 10.9 g/dL — ABNORMAL LOW (ref 13.0–17.0)
MCH: 31.6 pg (ref 26.0–34.0)
MCHC: 34.3 g/dL (ref 30.0–36.0)
MCV: 92.2 fL (ref 78.0–100.0)
PLATELETS: 105 10*3/uL — AB (ref 150–400)
RBC: 3.45 MIL/uL — AB (ref 4.22–5.81)
RDW: 14 % (ref 11.5–15.5)
WBC: 9.9 10*3/uL (ref 4.0–10.5)

## 2015-09-23 LAB — HIV ANTIBODY (ROUTINE TESTING W REFLEX): HIV SCREEN 4TH GENERATION: NONREACTIVE

## 2015-09-23 LAB — GLUCOSE, CAPILLARY
GLUCOSE-CAPILLARY: 177 mg/dL — AB (ref 65–99)
Glucose-Capillary: 104 mg/dL — ABNORMAL HIGH (ref 65–99)
Glucose-Capillary: 157 mg/dL — ABNORMAL HIGH (ref 65–99)
Glucose-Capillary: 169 mg/dL — ABNORMAL HIGH (ref 65–99)

## 2015-09-23 MED ORDER — RESOURCE THICKENUP CLEAR PO POWD
ORAL | Status: DC | PRN
  Administered 2015-09-26: 02:00:00 via ORAL
  Filled 2015-09-23 (×2): qty 125

## 2015-09-23 NOTE — Clinical Social Work Note (Signed)
Clinical Social Work Assessment  Patient Details  Name: Johnny [name] MRN: [id number] Date of Birth: May 04, 1934  Date of referral:  09/23/15               Reason for consult:  Facility Placement                Permission sought to share information with:  Family Supports Permission granted to share information::  No (pt disoriented and going through DTs)  Name::     Suprena  Agency::  SNFs  Relationship::  dtr  Contact Information:     Housing/Transportation Living arrangements for the past 2 months:  Single Family Home Source of Information:  Spouse Patient Interpreter Needed:  None Criminal Activity/Legal Involvement Pertinent to Current Situation/Hospitalization:  No - Comment as needed Significant Relationships:  Adult Children Lives with:  Self Do you feel safe going back to the place where you live?  No Need for family participation in patient care:  Yes (Comment) (currently needing help with decision making)  Care giving concerns:  Pt lives at home alone- has support from two adult children and friends but per dtr there is not 24 hour help or consistent supervision.   Social Worker assessment / plan:  CSW spoke with pt dtr concerning PT recommendation for SNF.  Dtr expressed frustration about pt issues that she contributes completely to his alcohol use.  Dtr reports pt has been to SNF in the past and had a good experience with [facility].  Employment status:  Retired Database administratornsurance information:  Managed Medicare PT Recommendations:  Skilled Nursing Facility Information / Referral to community resources:  Skilled Nursing Facility  Patient/Family's Response to care:  Dtr is agreeable to SNF stay- [facility] so pt will be close to family and allow for more frequent visitation.  Patient/Family's Understanding of and Emotional Response to Diagnosis, Current Treatment, and Prognosis:  Dtr very familiar with pt current medical issues and wishes pt would be able to break cycle  of drinking.  Emotional Assessment Appearance:  Appears stated age Attitude/Demeanor/Rapport:  Unable to Assess Affect (typically observed):  Unable to Assess Orientation:  Oriented to Self, Oriented to Place, Oriented to  Time, Oriented to Situation Alcohol / Substance use:  Alcohol Use Psych involvement (Current and /or in the community):  No (Comment)  Discharge Needs  Concerns to be addressed:  Care Coordination Readmission within the last 30 days:  No Current discharge risk:  Substance Abuse, Physical Impairment, Lives alone Barriers to Discharge:  Continued Medical Work up   Peabody EnergyHoloman, Jamin Panther M, LCSW 09/23/2015, 2:37 PM

## 2015-09-23 NOTE — Evaluation (Signed)
Physical Therapy Evaluation Patient Details Name: Johnny NewcomerDan E Goetzke MRN: [id number] DOB: 1934-05-05 Today's Date: 09/23/2015   History of Present Illness  Pt admitted s/p falls, weakness and ETOH abuse.  Pt with hx of CAD, SZ, DM, CVA and ETOH abuse  Clinical Impression  Pt admitted as above and presenting with functional mobility limiiations 2* generalized weakness, decreased coordination, significant balance deficits, elevated anxiety level regarding falls and lack of safety awareness.  Pt would benefit from follow up rehab at SNF level.    Follow Up Recommendations SNF    Equipment Recommendations  None recommended by PT    Recommendations for Other Services OT consult     Precautions / Restrictions Precautions Precautions: Fall Restrictions Weight Bearing Restrictions: No      Mobility  Bed Mobility Overal bed mobility: Needs Assistance;+2 for physical assistance;+ 2 for safety/equipment Bed Mobility: Supine to Sit     Supine to sit: Mod assist;+2 for physical assistance;+2 for safety/equipment     General bed mobility comments: cues for sequence and saftey and physical assist to manage LEs and to brign trunk to upright and maintain balance.  Pt sitting at EOB x 5 min working on sitting balance to correct for post lean and R drift.  Pt eventually sitting with Sup  Transfers Overall transfer level: Needs assistance Equipment used: Rolling walker (2 wheeled) Transfers: Sit to/from Stand Sit to Stand: +2 physical assistance;+2 safety/equipment;From elevated surface;Max assist;Mod assist         General transfer comment: Cues for LE placement and use of UEs to self assist.  Bed used to assist pt to stand with mod assist of 2.  Once pt in chair, unable to stand from chair 2* ht of chair and pt anxiety increasing regarding falling.  Ambulation/Gait Ambulation/Gait assistance: Mod assist;+2 physical assistance;+2 safety/equipment Ambulation Distance (Feet): 2 Feet Assistive  device: Rolling walker (2 wheeled) Gait Pattern/deviations: Step-to pattern;Decreased step length - right;Decreased step length - left;Shuffle;Trunk flexed;Wide base of support;Staggering right;Staggering left     General Gait Details: Pt very unstable, struggling to advance either LE (R>L) and unable to achieve fully erect posture.  Mod assist x 2 for balance and support using RW to step from bed to chair.  Stairs            Wheelchair Mobility    Modified Rankin (Stroke Patients Only)       Balance Overall balance assessment: Needs assistance Sitting-balance support: Feet supported;Bilateral upper extremity supported Sitting balance-Leahy Scale: Poor     Standing balance support: Bilateral upper extremity supported Standing balance-Leahy Scale: Poor                               Pertinent Vitals/Pain Pain Assessment: No/denies pain    Home Living Family/patient expects to be discharged to:: Unsure Living Arrangements: Alone               Additional Comments: has an aide 2 x week?    Prior Function Level of Independence: Independent with assistive device(s)         Comments: Pt claims IND with RW but has hx of falls at home     Hand Dominance   Dominant Hand: Right    Extremity/Trunk Assessment   Upper Extremity Assessment: Generalized weakness;RUE deficits/detail;LUE deficits/detail RUE Deficits / Details: Significant tremor Bil UEs     LUE Deficits / Details: Tremor noted Bil Ues   Lower Extremity Assessment: Generalized  weakness      Cervical / Trunk Assessment: Kyphotic  Communication   Communication: No difficulties  Cognition Arousal/Alertness: Awake/alert Behavior During Therapy: Impulsive Overall Cognitive Status: No family/caregiver present to determine baseline cognitive functioning                      General Comments      Exercises        Assessment/Plan    PT Assessment Patient needs  continued PT services  PT Diagnosis Difficulty walking   PT Problem List Decreased strength;Decreased range of motion;Decreased activity tolerance;Decreased balance;Decreased mobility;Decreased cognition;Decreased knowledge of use of DME;Decreased safety awareness  PT Treatment Interventions DME instruction;Gait training;Functional mobility training;Therapeutic activities;Therapeutic exercise;Balance training;Patient/family education   PT Goals (Current goals can be found in the Care Plan section) Acute Rehab PT Goals Patient Stated Goal: walk PT Goal Formulation: With patient Time For Goal Achievement: 10/06/15 Potential to Achieve Goals: Fair    Frequency Min 3X/week   Barriers to discharge Decreased caregiver support Pt reports largely home alone    Co-evaluation               End of Session Equipment Utilized During Treatment: Gait belt Activity Tolerance: Patient limited by fatigue;Other (comment) (and anxiety re falls) Patient left: in chair;with call bell/phone within reach;with chair alarm set Nurse Communication: Mobility status;Need for lift equipment         Time: 1610-9604 PT Time Calculation (min) (ACUTE ONLY): 31 min   Charges:   PT Evaluation $PT Eval Moderate Complexity: 1 Procedure PT Treatments $Therapeutic Activity: 8-22 mins   PT G Codes:        Morse Brueggemann October 03, 2015, 12:26 PM
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
## 2015-09-23 NOTE — NC FL2 (Signed)
[place] MEDICAID FL2 LEVEL OF CARE SCREENING TOOL     IDENTIFICATION  Patient Name: Johnny [name] Birthdate: Jul 31, 1934 Sex: male Admission Date (Current Location): 09/21/2015  [facility] and [place] Number:  Producer, television/film/video and Address:  [facility],  [address] [place], [place] [address]      Provider Number: [id number]  Attending Physician Name and Address:  Alberteen Sam, MD  Relative Name and Phone Number:  Martie Lee, daughter, [phone]    Current Level of Care: Hospital Recommended Level of Care: Skilled Nursing Facility Prior Approval Number:    Date Approved/Denied:   PASRR Number: [id number]  Discharge Plan: SNF    Current Diagnoses: Patient Active Problem List   Diagnosis Date Noted  . CAP (community acquired pneumonia) 09/22/2015  . Pneumonia 09/22/2015  . Pressure ulcer 09/22/2015  . Right wrist pain   . Alcohol intoxication (HCC)   . Encounter for palliative care   . Goals of care, counseling/discussion   . Alcoholic cardiomyopathy (HCC) 36/64/4034  . Weakness 05/01/2015  . Sinus tachycardia (HCC)   . Debility   . Leg edema, left   . COPD (chronic obstructive pulmonary disease), presumed 04/26/2012  . Failure to wean from mechanical ventilation (HCC) 04/26/2012  . Hypervolemia 04/26/2012  . Electrolyte and fluid disorder 04/26/2012  . Protein calorie malnutrition (HCC) 04/26/2012  . Poor dentition 04/26/2012  . UTI (lower urinary tract infection) resolved.  04/26/2012  . Atrial flutter (HCC) 04/13/2012  . Tracheostomy dependence (HCC) 04/11/2012  . Protein-calorie malnutrition, severe (HCC) 04/11/2012  . s/p PEA Cardiac arrest: Likely related to pneumonia, acidosis, hypoxic respiratory failure in setting of ETOH withdrawal 04/02/2012  . Septic shock(785.52) in setting of pneumonia (resolved) 04/02/2012  . Anoxic Encephalopathy: due to hypoxia after cardiac arrest.  04/02/2012  . HCAP (healthcare-associated pneumonia)  -->resolved 04/01/2012  . Alcohol dependence s/p withdrawl  04/01/2012  . acute renal failure (resolved): Baseline creatinine 0.95 from 01/05/12.  04/01/2012  . SIRS (systemic inflammatory response syndrome) (HCC) 12/30/2011  . Hypomagnesemia 12/30/2011  . Weakness generalized 12/29/2011  . Hypertensive urgency 12/29/2011  . Tachycardia 04/28/2011  . Lower extremity weakness 04/28/2011  . Anemia of chronic disease 08/16/2007  . Paroxysmal atrial fibrillation (HCC) 08/12/2007  . Diabetes mellitus with peripheral vascular disease (HCC) 12/11/2006  . Essential hypertension 12/11/2006  . Coronary atherosclerosis 12/11/2006    Orientation RESPIRATION BLADDER Height & Weight     Self, Time, Situation, Place  Normal External catheter Weight: 174 lb 6.4 oz (79.107 kg) Height:   (180.3 cm)  BEHAVIORAL SYMPTOMS/MOOD NEUROLOGICAL BOWEL NUTRITION STATUS      Incontinent Diet (see DC summary)  AMBULATORY STATUS COMMUNICATION OF NEEDS Skin   Extensive Assist Verbally PU Stage and Appropriate Care (buttocks)   PU Stage 2 Dressing:  (buttocks)                   Personal Care Assistance Level of Assistance  Bathing, Dressing Bathing Assistance: Maximum assistance   Dressing Assistance: Maximum assistance     Functional Limitations Info             SPECIAL CARE FACTORS FREQUENCY  PT (By licensed PT), OT (By licensed OT)     PT Frequency: 5/wk OT Frequency: 5/wk            Contractures      Additional Factors Info  Code Status, Allergies, Insulin Sliding Scale Code Status Info: FULL Allergies Info: NKA   Insulin Sliding Scale Info:  4/day       Current Medications (09/23/2015):  This is the current hospital active medication list Current Facility-Administered Medications  Medication Dose Route Frequency Provider Last Rate Last Dose  . acetaminophen (TYLENOL) tablet 650 mg  650 mg Oral Q6H PRN Alberteen Samhristopher P Danford, MD       Or  . acetaminophen (TYLENOL)  suppository 650 mg  650 mg Rectal Q6H PRN Alberteen Samhristopher P Danford, MD      . albuterol (PROVENTIL) (2.5 MG/3ML) 0.083% nebulizer solution 2.5 mg  2.5 mg Nebulization Q2H PRN Alberteen Samhristopher P Danford, MD      . aspirin EC tablet 81 mg  81 mg Oral Daily Alberteen Samhristopher P Danford, MD   81 mg at 09/23/15 [id number]  . feeding supplement (ENSURE ENLIVE) (ENSURE ENLIVE) liquid 237 mL  237 mL Oral BID BM Alberteen Samhristopher P Danford, MD   237 mL at 09/23/15 0924  . folic acid (FOLVITE) tablet 1 mg  1 mg Oral Daily Alberteen Samhristopher P Danford, MD   1 mg at 09/23/15 0923  . hydrALAZINE (APRESOLINE) injection 5 mg  5 mg Intravenous Q6H PRN Alberteen Samhristopher P Danford, MD   5 mg at 09/22/15 1445  . insulin aspart (novoLOG) injection 0-15 Units  0-15 Units Subcutaneous TID WC Alberteen Samhristopher P Danford, MD   3 Units at 09/23/15 1204  . insulin aspart (novoLOG) injection 0-5 Units  0-5 Units Subcutaneous QHS Alberteen Samhristopher P Danford, MD   0 Units at 09/22/15 2200  . LORazepam (ATIVAN) tablet 1 mg  1 mg Oral Q6H PRN Alberteen Samhristopher P Danford, MD       Or  . LORazepam (ATIVAN) injection 1 mg  1 mg Intravenous Q6H PRN Alberteen Samhristopher P Danford, MD   1 mg at 09/22/15 1851  . metoprolol tartrate (LOPRESSOR) tablet 75 mg  75 mg Oral BID Alberteen Samhristopher P Danford, MD   75 mg at 09/23/15 0923  . multivitamin with minerals tablet 1 tablet  1 tablet Oral Daily Alberteen Samhristopher P Danford, MD   1 tablet at 09/23/15 0924  . RESOURCE THICKENUP CLEAR   Oral PRN Alberteen Samhristopher P Danford, MD      . tamsulosin (FLOMAX) capsule 0.4 mg  0.4 mg Oral Daily Alberteen Samhristopher P Danford, MD   0.4 mg at 09/23/15 0923  . thiamine (VITAMIN B-1) tablet 100 mg  100 mg Oral Daily Alberteen Samhristopher P Danford, MD   100 mg at 09/23/15 [id number]   Or  . thiamine (B-1) injection 100 mg  100 mg Intravenous Daily Alberteen Samhristopher P Danford, MD         Discharge Medications: Please see discharge summary for a list of discharge medications.  Relevant Imaging Results:  Relevant Lab Results:   Additional Information SSN:  [id number]  Izora RibasHoloman, Bettyann Birchler M, [place]

## 2015-09-24 LAB — PROCALCITONIN: Procalcitonin: <0.1 ng/mL

## 2015-09-24 LAB — LEGIONELLA PNEUMOPHILA SEROGP 1 UR AG: L. PNEUMOPHILA SEROGP 1 UR AG: NEGATIVE

## 2015-09-24 LAB — CBC
HCT: 31.8 % — ABNORMAL LOW (ref 39.0–52.0)
Hemoglobin: 11 g/dL — ABNORMAL LOW (ref 13.0–17.0)
MCH: 31.9 pg (ref 26.0–34.0)
MCHC: 34.6 g/dL (ref 30.0–36.0)
MCV: 92.2 fL (ref 78.0–100.0)
PLATELETS: 108 10*3/uL — AB (ref 150–400)
RBC: 3.45 MIL/uL — AB (ref 4.22–5.81)
RDW: 13.9 % (ref 11.5–15.5)
WBC: 9.2 10*3/uL (ref 4.0–10.5)

## 2015-09-24 LAB — BASIC METABOLIC PANEL
Anion gap: 11 (ref 5–15)
BUN: 19 mg/dL (ref 6–20)
CO2: 23 mmol/L (ref 22–32)
Calcium: 8.7 mg/dL — ABNORMAL LOW (ref 8.9–10.3)
Chloride: 101 mmol/L (ref 101–111)
Creatinine, Ser: 0.98 mg/dL (ref 0.61–1.24)
GFR calc Af Amer: >60 mL/min (ref >60)
GLUCOSE: 159 mg/dL — AB (ref 65–99)
POTASSIUM: 3.7 mmol/L (ref 3.5–5.1)
Sodium: 135 mmol/L (ref 135–145)

## 2015-09-24 LAB — GLUCOSE, CAPILLARY
Glucose-Capillary: 157 mg/dL — ABNORMAL HIGH (ref 65–99)
Glucose-Capillary: 135 mg/dL — ABNORMAL HIGH (ref 65–99)
Glucose-Capillary: 171 mg/dL — ABNORMAL HIGH (ref 65–99)
Glucose-Capillary: 142 mg/dL — ABNORMAL HIGH (ref 65–99)

## 2015-09-24 LAB — HEMOGLOBIN A1C
HEMOGLOBIN A1C: 6.9 % — AB (ref 4.8–5.6)
Mean Plasma Glucose: 151 mg/dL

## 2015-09-24 MED ORDER — SODIUM CHLORIDE 0.9 % IV SOLN: INTRAVENOUS | Status: DC

## 2015-09-24 MED ORDER — CHLORDIAZEPOXIDE HCL 10 MG PO CAPS
10.0000 mg | ORAL_CAPSULE | Freq: Three times a day (TID) | ORAL | Status: DC
  Administered 2015-09-24 – 2015-09-25 (×4): 10 mg via ORAL
  Filled 2015-09-24 (×4): qty 1

## 2015-09-24 MED ORDER — HEPARIN SODIUM (PORCINE) 5000 UNIT/ML IJ SOLN
5000.0000 [IU] | Freq: Three times a day (TID) | INTRAMUSCULAR | Status: DC
  Administered 2015-09-24 – 2015-09-25 (×6): 5000 [IU] via SUBCUTANEOUS
  Filled 2015-09-24 (×6): qty 1

## 2015-09-24 NOTE — Care Management Note (Signed)
Case Management Note  Patient Details  Name: Johnny [name] MRN: [id number] Date of Birth: March 22, 1935  Subjective/Objective: 81 y/o m admitted w/PNA. Hx: etoh-detox, ciwa. PT recc SNF. CSW following.                   Action/Plan:d/c plan SNF.   Expected Discharge Date:                  Expected Discharge Plan:  Skilled Nursing Facility  In-House Referral:     Discharge planning Services  CM Consult  Post Acute Care Choice:  Home Health Choice offered to:  Patient  DME Arranged:   (none required per patient) DME Agency:     HH Arranged:    HH Agency:  [facility]  Status of Service:  In process, will continue to follow  Medicare Important Message Given:    Date Medicare IM Given:    Medicare IM give by:    Date Additional Medicare IM Given:    Additional Medicare Important Message give by:     If discussed at Long Length of Stay Meetings, dates discussed:    Additional Comments:  Lanier ClamMahabir, Johnny Cobern, RN 09/24/2015, 1:17 PM

## 2015-09-24 NOTE — Progress Notes (Signed)
Initial Nutrition Assessment  DOCUMENTATION CODES:   Non-severe (moderate) malnutrition in context of chronic illness  INTERVENTION:   Continue Ensure Enlive po BID, each supplement provides 350 kcal and 20 grams of protein Encourage PO intake RD to continue to monitor  NUTRITION DIAGNOSIS:   Inadequate oral intake related to poor appetite (poor dentition) as evidenced by per patient/family report.  GOAL:   Patient will meet greater than or equal to 90% of their needs  MONITOR:   PO intake, Supplement acceptance, Labs, Weight trends, I & O's  REASON FOR ASSESSMENT:   Malnutrition Screening Tool    ASSESSMENT:   81 y.o. 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  Pt in room with lunch tray at bedside. Pt confused during visit and was unable to provide much history. Pt is currently on Dysphagia 3 diet with nectar thick liquids. He states he consumed toast and coffee this morning. He has not had any of his lunch tray yet. Pt requires feeding assistance. Pt reports poor appetite for "a little while". He has noticeable poor dentition. Pt states he does like the Ensure supplements, will continue order.  Per weight history, pt has lost 8 lb since 3/6 (3% wt loss x 3 months, significant for time frame). Nutrition-Focused physical exam completed. Findings are moderate fat depletion, moderate muscle depletion, and no edema.   Medications: folic acid tablet BID, Multivitamin with minerals daily, Thiamine tablet daily, Resource Thicken-up Labs reviewed: CBGs: 135-157  Diet Order:  DIET DYS 3 Room service appropriate?: Yes; Fluid consistency:: Nectar Thick  Skin:  Wound (see comment) (Stg II buttocks ulcer, Stg III neck ulcer)  Last BM:  6/4  Height:   Ht Readings from Last 1 Encounters:  09/22/15 5\' 11"  (1.803 m)    Weight:   Wt Readings from Last 1 Encounters:   09/22/15 174 lb 6.4 oz (79.107 kg)    Ideal Body Weight:  78.2 kg  BMI:  Body mass index is 24.33 kg/(m^2).  Estimated Nutritional Needs:   Kcal:  1800-2000  Protein:  90-100g  Fluid:  2L/day  EDUCATION NEEDS:   No education needs identified at this time  Tilda FrancoLindsey Clayton Jarmon, MS, RD, LDN Pager: [phone] After Hours Pager: [phone]

## 2015-09-24 NOTE — Consult Note (Signed)
   [facility]   09/24/2015  Jarvis NewcomerDan E Maynes [birth year] [id number]   [name] [facility] for [facility] disease case management. Chart reviewed. Noted discharge plan is fortunately for SNF. Please chart review tab then notes for patient outreach [facility] as patient was encouraged to be placed from home prior but declined at that time. Will continue to follow and make inpatient RNCM aware that [facility].    Raiford NobleAtika Keyly Baldonado, MSN-Ed, RN,BSN [facility] Liaison [phone]

## 2015-09-24 NOTE — Telephone Encounter (Signed)
Noted  
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
## 2015-09-24 NOTE — Progress Notes (Addendum)
Starting at 1900 pt increased in restlessness/agitiation/ and visual hallucinations.  Attempts to reorient fail.  Ciwa started at 16 and decreased to 10 then 9 throughout the shift with PRN ativan.  This same behavior was noted on his last admission when I was his night nurse.  VSS and HR is around 107 to 110 when pt would rest.  Frequent monitoring continued with all comfort measures given.

## 2015-09-24 NOTE — Telephone Encounter (Signed)
Pt has been scheduled for 10/05/15

## 2015-09-24 NOTE — Evaluation (Signed)
Clinical/Bedside Swallow Evaluation Patient Details  Name: Johnny NewcomerDan E [name] MRN: [id number] Date of Birth: [birth year]  Today's Date: 09/24/2015 Time: SLP Start Time (ACUTE ONLY): 1425 SLP Stop Time (ACUTE ONLY): 1451 SLP Time Calculation (min) (ACUTE ONLY): 26 min  Past Medical History:  Past Medical History  Diagnosis Date  . Acute alcoholic hepatitis 05/20/2010  . Anemia of other chronic disease 08/16/2007  . CAD (coronary artery disease)     a. s/p CABG, notes unclear - 1998 or 2003?  Marland Kitchen. Essential hypertension   . Hyperlipidemia   . ETOH abuse   . Chronic low back pain   . Pneumonia     "now; never before" (04/01/2012)  . Hallucination 04/01/2012  . Arthritis   . Altered mental status 04/01/2012  . Seizures (HCC) 04/01/2012  . Diabetes mellitus (HCC)   . History of stroke   . Paroxysmal atrial fibrillation (HCC)   . Paroxysmal atrial flutter (HCC)     a. s/p ablation 2007.  . Coagulopathy (HCC)   . Protein calorie malnutrition (HCC)   . PEA (Pulseless electrical activity) (HCC)     a. h/o PEA cardiac arrest 04/2012 (felt r/t PNA, acidosis, hypoxic resp failure in setting of alcohol withdrawal; required tracheostomy)   Past Surgical History:  Past Surgical History  Procedure Laterality Date  . Coronary artery bypass graft  ~ 2003    CABG X4  . Cataract extraction w/ intraocular lens  implant, bilateral      "years ago" (04/01/2012)  . Esophagogastroduodenoscopy  04/13/2012    Procedure: ESOPHAGOGASTRODUODENOSCOPY (EGD);  Surgeon: Florencia Reasonsobert V Buccini, MD;  [facility];  Service: Endoscopy;  Laterality: N/A;  push peg  . Peg placement  04/13/2012    Procedure: PERCUTANEOUS ENDOSCOPIC GASTROSTOMY (PEG) PLACEMENT;  Surgeon: Florencia Reasonsobert V Buccini, MD;  Location: [facility] ENDOSCOPY;  Service: Endoscopy;  Laterality: N/A;   HPI:  81 [facility] and [facility] - diagnosed with CAP.  PMH + for ETOH and tobacco use, weakness, CVA, CAD, seizure, DM, tremors and weakness.  CXR  showed left lower lobe pna.  Pt has had a trach/PEG with respiratory failure and cardiac arrest in the past (2014).  Swallow evaluation ordered. RN reports delayed cough with intake.     Assessment / Plan / Recommendation Clinical Impression  Pt currently is severely dysarthric but pleasant and cooperative for evaluation.  Has h/o dysphagia and has required PEG/Trach previously.  Pt admits to h/o dysphagia - coughing with liquids more than food x approximately 2 years. He does recall seeing this SLP before re: swallowing without cues.  Overt delayed cough x2 with ice chips - not observed with nectar.    Multiple swallows noted across all consistencies, ? piecemealing or pharyngeal residuals.  Phonation wet/gurgly clearing with cued cough.  Recommend pt continue dys3/nectar with strict precautions.   Chin tuck posture not tested due to pt's pain and recent fall.     Will plan MBS next date given issue with dysphagia/pna - scheduled at approximately 1230 pm.  RN and pt informed.  Using teach back, education completed.    Aspiration Risk  Moderate aspiration risk    Diet Recommendation Dysphagia 3 (Mech soft);Nectar-thick liquid   Medication Administration: Whole meds with puree Supervision: Staff to assist with self feeding Compensations: Slow rate;Small sips/bites (allow time for multiple swallows, cough/clear throat if voice gurgly/wet) Postural Changes: Seated upright at 90 degrees;Remain upright for at least 30 minutes after po intake    Other  Recommendations Oral Care Recommendations: Oral care BID Other Recommendations: Clarify dietary restrictions   Follow up Recommendations       Frequency and Duration min 1 x/week  1 week       Prognosis Prognosis for Safe Diet Advancement: Fair Barriers to Reach Goals: Severity of deficits;Time post onset;Other (Comment);Cognitive deficits (h/o etoh use)      Swallow Study   General Date of Onset: 09/24/15 HPI: 81 yo male adm to [facility] with  cough and weakness - diagnosed with CAP.  PMH + for ETOH and tobacco use, weakness, CVA, CAD, seizure, DM, tremors and weakness.  CXR showed left lower lobe pna.  Pt has had a trach/PEG with respiratory failure and cardiac arrest in the past (2014).  Swallow evaluation ordered. RN reports delayed cough with intake.   Previous Swallow Assessment: Bedside swallow evaluation  -  has been made npo in the past  Diet Prior to this Study: Dysphagia 3 (soft);Nectar-thick liquids Temperature Spikes Noted: No Respiratory Status: Room air History of Recent Intubation: No Behavior/Cognition: Alert;Cooperative Oral Care Completed by SLP: No Vision: Impaired for self-feeding Self-Feeding Abilities: Needs assist Patient Positioning: Upright in bed Baseline Vocal Quality: Low vocal intensity Volitional Cough: Weak Volitional Swallow: Able to elicit    Oral/Motor/Sensory Function Overall Oral Motor/Sensory Function: Generalized oral weakness   Ice Chips Ice chips: Impaired Presentation: Spoon Oral Phase Impairments: Reduced lingual movement/coordination Pharyngeal Phase Impairments: Suspected delayed Swallow;Multiple swallows;Wet Vocal Quality;Cough - Immediate   Thin Liquid Thin Liquid: Not tested    Nectar Thick Nectar Thick Liquid: Impaired Presentation: Cup;Straw;Spoon Pharyngeal Phase Impairments: Suspected delayed Swallow;Decreased hyoid-laryngeal movement;Multiple swallows;Wet Vocal Quality;Other (comments) Other Comments: wet voice cleared with cued strong cough/throat clearing    Honey Thick Honey Thick Liquid: Not tested   Puree Puree: Not tested   Solid   GO   Solid: Impaired Presentation: Self Fed;Spoon Oral Phase Impairments: Reduced lingual movement/coordination;Impaired mastication;Reduced labial seal Pharyngeal Phase Impairments: Suspected delayed Swallow;Multiple swallows;Decreased hyoid-laryngeal movement        Mills Koller, MS [facility]  SLP [phone]

## 2015-09-24 NOTE — Evaluation (Signed)
Occupational Therapy Evaluation Patient Details Name: Jarvis NewcomerDan E Blanke MRN: [id number] DOB: 19-Aug-1934 Today's Date: 09/24/2015    History of Present Illness Pt admitted s/p falls, weakness and ETOH abuse.  Pt with hx of CAD, SZ, DM, CVA and ETOH abuse   Clinical Impression   Pt admitted with fall. Pt currently with functional limitations due to the deficits listed below (see OT Problem List).  Pt will benefit from skilled OT to increase their safety and independence with ADL and functional mobility for ADL to facilitate discharge to venue listed below.      Follow Up Recommendations  SNF    Equipment Recommendations  None recommended by OT           Mobility Bed Mobility Overal bed mobility: Needs Assistance;+2 for physical assistance;+ 2 for safety/equipment Bed Mobility: Rolling Rolling: Max assist         General bed mobility comments: increased time  Transfers    NT                       ADL Overall ADL's : Needs assistance/impaired Eating/Feeding: Bed level;Maximal assistance;Cueing for sequencing   Grooming: Maximal assistance;Bed level;Moderate assistance Grooming Details (indicate cue type and reason): washing face/ eyes and using mouth swab                                                  Hand Dominance Right   Extremity/Trunk Assessment Upper Extremity Assessment Upper Extremity Assessment: Generalized weakness;RUE deficits/detail;LUE deficits/detail RUE Coordination: decreased fine motor;decreased gross motor LUE Deficits / Details: Tremor noted Bil Ues       Cervical / Trunk Assessment Cervical / Trunk Assessment: Kyphotic   Communication Communication Communication: No difficulties   Cognition Arousal/Alertness: Lethargic Behavior During Therapy: WFL for tasks assessed/performed Overall Cognitive Status: No family/caregiver present to determine baseline cognitive functioning                                Home Living Family/patient expects to be discharged to:: Skilled nursing facility Living Arrangements: Alone                               Additional Comments: has an aide 2 x week?      Prior Functioning/Environment Level of Independence: Independent with assistive device(s)        Comments: Pt claims IND with RW but has hx of falls at home    OT Diagnosis: Generalized weakness   OT Problem List: Decreased strength;Decreased activity tolerance;Decreased safety awareness   OT Treatment/Interventions: Self-care/ADL training;Patient/family education    OT Goals(Current goals can be found in the care plan section) Acute Rehab OT Goals Patient Stated Goal: did not state Time For Goal Achievement: 10/08/15  OT Frequency: Min 2X/week   Barriers to D/C: Decreased caregiver support             End of Session Nurse Communication: Mobility status  Activity Tolerance: Patient limited by fatigue Patient left: in bed;with call bell/phone within reach;with bed alarm set   Time: 1400-1420 OT Time Calculation (min): 20 min Charges:  OT General Charges $OT Visit: 1 Procedure OT Evaluation $OT Eval Moderate Complexity: 1 Procedure G-Codes:    Howard Bunte, Karin GoldenLorraine  D 09/24/2015, 2:26 PM

## 2015-09-24 NOTE — Progress Notes (Addendum)
LCSW following for ST SNF placement recommendation and referral. [facility] to see status of financial situation and if money is owed as family has expressed possibility of money owed. [facility] LCSW completed passar updated due to current ETOH.  Passar received and stayed the same:  [phone]   LCSW needs to obtain insurance [facility] with regard to ST SNF. PT notes completed and reflecting skilled benefit.  Patient currently on CIWA protocol, not ready for dc per notes. WIll follow and assist with SNF:  Awaiting:   [facility] (completed:  Patient accepted, but has an outstanding bill of 1700.00 that must be paid (half) prior to [facility], with plans of a payment plan if [facility]) Insurance Authorization for SNF Detox completed/Withdrawl  Patient alert and oriented to agree with SNF plan.  Deretha EmoryHannah Jaelee Laughter LCSW, MSW Clinical Social Work: [employer] [phone]

## 2015-09-25 ENCOUNTER — Inpatient Hospital Stay (HOSPITAL_COMMUNITY): Payer: Commercial Managed Care - HMO

## 2015-09-25 LAB — GLUCOSE, CAPILLARY
GLUCOSE-CAPILLARY: 149 mg/dL — AB (ref 65–99)
GLUCOSE-CAPILLARY: 150 mg/dL — AB (ref 65–99)
Glucose-Capillary: 214 mg/dL — ABNORMAL HIGH (ref 65–99)
Glucose-Capillary: 132 mg/dL — ABNORMAL HIGH (ref 65–99)

## 2015-09-25 MED ORDER — CHLORDIAZEPOXIDE HCL 5 MG PO CAPS
5.0000 mg | ORAL_CAPSULE | Freq: Two times a day (BID) | ORAL | Status: DC
  Administered 2015-09-25 – 2015-09-26 (×2): 5 mg via ORAL
  Filled 2015-09-25 (×2): qty 1

## 2015-09-25 MED ORDER — CHLORDIAZEPOXIDE HCL 5 MG PO CAPS: 5.0000 mg | ORAL_CAPSULE | Freq: Three times a day (TID) | ORAL | Status: DC

## 2015-09-25 NOTE — Care Management Important Message (Signed)
Important Message  Patient Details  Name: Johnny NewcomerDan E Alphin MRN: [id number] Date of Birth: 1935/01/23   Medicare Important Message Given:  Yes    Haskell FlirtJamison, Braylie Badami 09/25/2015, 9:18 AMImportant Message  Patient Details  Name: Johnny NewcomerDan E Perdomo MRN: [id number] Date of Birth: 1935/01/23   Medicare Important Message Given:  Yes    Haskell FlirtJamison, Camiya Vinal 09/25/2015, 9:17 AM

## 2015-09-25 NOTE — Progress Notes (Signed)
Physical Therapy Treatment Patient Details Name: Jarvis NewcomerDan E Majewski MRN: [id number] DOB: 03/17/1935 Today's Date: 09/25/2015    History of Present Illness Pt admitted s/p falls, weakness and ETOH abuse.  Pt with hx of CAD, SZ, DM, CVA and ETOH abuse    PT Comments    Pt in bed difficult to arouse.  Sleepy/groggy pt does follow commands a verbally [facility].  Pt required + 2 total Assist pt 5% for bed mobility.  Poor sitting balance EOB with difficulty achieving mid line.   Attempted sit to stand + 2 assist however pt was unable to power up enough to clear hips off bed.  R knee remained in flex and B feet started to "ski" forward even with B shoes applied.  Pt stated he needed to have a BM, so [facility] [place].  Assisted via [place]oyer to recliner.   Pt will need ST Rehab at SNF.    Follow Up Recommendations  SNF     Equipment Recommendations       Recommendations for Other Services       Precautions / Restrictions Precautions Precautions: Fall Restrictions Weight Bearing Restrictions: No    Mobility  Bed Mobility Overal bed mobility: Needs Assistance;+2 for physical assistance;+ 2 for safety/equipment Bed Mobility: Supine to Sit;Rolling Rolling: Total assist;+2 for physical assistance   Supine to sit: Total assist;+2 for physical assistance     General bed mobility comments: pt 5%      very groggy/sleepy with delayed with instruction.    Transfers Overall transfer level: Needs assistance Equipment used: None Transfers: Sit to/from Stand           General transfer comment: attempted sit to stand from elevated bed however pt unable to put forth enough strength to power up.  R knee remained flex and even with B shoes applied, pt started to "ski" forward.  Used Hoyer Lift [facility] then to recliner.    Ambulation/Gait             General Gait Details: unable to attempt due to low transfer ability    Stairs             Wheelchair Mobility    Modified Rankin (Stroke Patients Only)       Balance                                    Cognition Arousal/Alertness: Lethargic Behavior During Therapy:  (groggy/sleepy)                        Exercises      General Comments        Pertinent Vitals/Pain Pain Assessment: No/denies pain    Home Living                      Prior Function            PT Goals (current goals can now be found in the care plan section) Progress towards PT goals: Progressing toward goals    Frequency  Min 3X/week    PT Plan Current plan remains appropriate    Co-evaluation             End of Session Equipment Utilized During Treatment: Gait belt Activity Tolerance: Patient limited by lethargy Patient left: in chair;with call bell/phone within  reach     Time: 1017-1050 PT Time Calculation (min) (ACUTE ONLY): 33 min  Charges:  $Therapeutic Activity: 23-37 mins                    G Codes:      Felecia Shelling  PTA [facility]  Acute  Rehab Pager      [phone]

## 2015-09-25 NOTE — Progress Notes (Signed)
MBS completed, full report to follow. Wet voice noted at baseline due to pt aspirating secretions.  Cues to cough/throat clear helpful to decrease.    Pt with mild oral and moderately severe pharyngeal dysphagia c/b sensorimotor deficits.   Pharyngeal swallow is weak with decreased hyolaryngeal elevation/closure, poor epiglottic deflection resulting in significant pharyngeal residuals without pt sensation.  Head turn left *only approximately 30* per pt comfort* marginally helpful to decrease residuals.  Following solids with liquids helpful but results in laryngeal penetration/minimal aspiration with inconsistent pt sensation.  Cued cough helpful to minimize penetrates and clear aspirates.  Aspiration was worse with thin - although residuals worse with nectar.    Upon esophageal sweep, pt with minimal amount of residuals only.    SLP recommends to continue dys3/nectar - allowing ice chips with VERY strict precautions to mitigate aspiration risk.  Note CXR today showing improvement.     Pt will benefit from follow up at SNF for dysphagia management.   Recommend consideration for discussion re: goals of care at SNF due to suspected chronicity of pt's aspiration/dysphagia *pt reports occuring x2 years.   Donavan Burnetamara Larrisa Cravey, MS [facility] SLP [phone]

## 2015-09-25 NOTE — [place] FL2 (Signed)
[place] MEDICAID FL2 LEVEL OF CARE SCREENING TOOL     IDENTIFICATION  Patient Name: Johnny [name] Birthdate: [birth year] Sex: male Admission Date (Current Location): 09/21/2015  [facility] [place]   Facility and Address:  [facility],  [address], [place] [id number]      Provider Number: [id number]  Attending Physician Name and Address:  Leroy SeaPrashant K Singh, MD  Relative Name and Phone Number:  Martie LeeSabrina, daughter, [phone]    Current Level of Care: Hospital Recommended Level of Care: Skilled Nursing Facility Prior Approval Number:    Date Approved/Denied:   PASRR Number: [id number]  Discharge Plan: SNF    Current Diagnoses: Patient Active Problem List   Diagnosis Date Noted  . CAP (community acquired pneumonia) 09/22/2015  . Pneumonia 09/22/2015  . Pressure ulcer 09/22/2015  . Right wrist pain   . Alcohol intoxication (HCC)   . Encounter for palliative care   . Goals of care, counseling/discussion   . Alcoholic cardiomyopathy (HCC) 32/44/010201/02/2016  . Weakness 05/01/2015  . Sinus tachycardia (HCC)   . Debility   . Leg edema, left   . COPD (chronic obstructive pulmonary disease), presumed 04/26/2012  . Failure to wean from mechanical ventilation (HCC) 04/26/2012  . Hypervolemia 04/26/2012  . Electrolyte and fluid disorder 04/26/2012  . Protein calorie malnutrition (HCC) 04/26/2012  . Poor dentition 04/26/2012  . UTI (lower urinary tract infection) resolved.  04/26/2012  . Atrial flutter (HCC) 04/13/2012  . Tracheostomy dependence (HCC) 04/11/2012  . Protein-calorie malnutrition, severe (HCC) 04/11/2012  . s/p PEA Cardiac arrest: Likely related to pneumonia, acidosis, hypoxic respiratory failure in setting of ETOH withdrawal 04/02/2012  . Septic shock(785.52) in setting of pneumonia (resolved) 04/02/2012  . Anoxic Encephalopathy: due to hypoxia after cardiac arrest.  04/02/2012  . HCAP (healthcare-associated pneumonia) -->resolved  04/01/2012  . Alcohol dependence s/p withdrawl  04/01/2012  . acute renal failure (resolved): Baseline creatinine 0.95 from 01/05/12.  04/01/2012  . SIRS (systemic inflammatory response syndrome) (HCC) 12/30/2011  . Hypomagnesemia 12/30/2011  . Weakness generalized 12/29/2011  . Hypertensive urgency 12/29/2011  . Tachycardia 04/28/2011  . Lower extremity weakness 04/28/2011  . Anemia of chronic disease 08/16/2007  . Paroxysmal atrial fibrillation (HCC) 08/12/2007  . Diabetes mellitus with peripheral vascular disease (HCC) 12/11/2006  . Essential hypertension 12/11/2006  . Coronary atherosclerosis 12/11/2006    Orientation RESPIRATION BLADDER Height & Weight     Self, Time, Situation, Place  Normal External catheter Weight: 174 lb 6.4 oz (79.107 kg) Height:  5\' 11"  (180.3 cm)  BEHAVIORAL SYMPTOMS/MOOD NEUROLOGICAL BOWEL NUTRITION STATUS      Incontinent Dysphagia 3 (Mech soft);Nectar-thick liquid   AMBULATORY STATUS COMMUNICATION OF NEEDS Skin   Extensive Assist Verbally PU Stage and Appropriate Care (buttocks)   PU Stage 2 Dressing:  (buttocks)                   Personal Care Assistance Level of Assistance  Bathing, Dressing Bathing Assistance: Maximum assistance   Dressing Assistance: Maximum assistance     Functional Limitations Info             SPECIAL CARE FACTORS FREQUENCY  PT (By licensed PT), OT (By licensed OT)     PT Frequency: 5/wk OT Frequency: 5/wk            Contractures      Additional Factors Info  Code Status, Allergies, Insulin Sliding Scale Code Status Info: FULL Allergies Info: NKA   Insulin Sliding  Scale Info: 4/day       Current Medications (09/25/2015):  This is the current hospital active medication list Current Facility-Administered Medications  Medication Dose Route Frequency Provider Last Rate Last Dose  . acetaminophen (TYLENOL) tablet 650 mg  650 mg Oral Q6H PRN Alberteen Sam, MD       Or  . acetaminophen  (TYLENOL) suppository 650 mg  650 mg Rectal Q6H PRN Alberteen Sam, MD      . albuterol (PROVENTIL) (2.5 MG/3ML) 0.083% nebulizer solution 2.5 mg  2.5 mg Nebulization Q2H PRN Alberteen Sam, MD      . aspirin EC tablet 81 mg  81 mg Oral Daily Alberteen Sam, MD   81 mg at 09/24/15 0911  . chlordiazePOXIDE (LIBRIUM) capsule 10 mg  10 mg Oral TID Leroy Sea, MD   10 mg at 09/24/15 2351  . feeding supplement (ENSURE ENLIVE) (ENSURE ENLIVE) liquid 237 mL  237 mL Oral BID BM Alberteen Sam, MD   237 mL at 09/24/15 1400  . folic acid (FOLVITE) tablet 1 mg  1 mg Oral Daily Alberteen Sam, MD   1 mg at 09/24/15 0910  . heparin injection 5,000 Units  5,000 Units Subcutaneous Q8H Leroy Sea, MD   5,000 Units at 09/25/15 0612  . hydrALAZINE (APRESOLINE) injection 5 mg  5 mg Intravenous Q6H PRN Alberteen Sam, MD   5 mg at 09/22/15 1445  . insulin aspart (novoLOG) injection 0-15 Units  0-15 Units Subcutaneous TID WC Alberteen Sam, MD   2 Units at 09/25/15 0801  . insulin aspart (novoLOG) injection 0-5 Units  0-5 Units Subcutaneous QHS Alberteen Sam, MD   0 Units at 09/22/15 2200  . metoprolol tartrate (LOPRESSOR) tablet 75 mg  75 mg Oral BID Alberteen Sam, MD   75 mg at 09/24/15 2351  . multivitamin with minerals tablet 1 tablet  1 tablet Oral Daily Alberteen Sam, MD   1 tablet at 09/24/15 0911  . RESOURCE THICKENUP CLEAR   Oral PRN Alberteen Sam, MD      . tamsulosin (FLOMAX) capsule 0.4 mg  0.4 mg Oral Daily Alberteen Sam, MD   0.4 mg at 09/24/15 0910  . thiamine (VITAMIN B-1) tablet 100 mg  100 mg Oral Daily Alberteen Sam, MD   100 mg at 09/24/15 6387   Or  . thiamine (B-1) injection 100 mg  100 mg Intravenous Daily Alberteen Sam, MD         Discharge Medications: Please see discharge summary for a list of discharge medications.  Relevant Imaging Results:  Relevant Lab  Results:   Additional Information SSN: [id number]  Raye Sorrow, LCSW

## 2015-09-25 NOTE — Progress Notes (Addendum)
LCSW spoke with both daughters:  Johnny [name] and Johnny [name]. Johnny [name] is the POA and [facility] she is wanting placement for SNF. There is an outstanding balance and she is in agreement to pay half upfront as educated. [name] of [facility] and making contact with POA. Patient is not medically stable today, but has bed offer and acceptance (pending money up front to [facility]) LCSW will obtain insurance authorization [facility] 6/6. In effort to plan for DC to SNF on 6/7 per MD notes. [facility] for SNF Auth. She is aware of bed choice and possible DC on Wednesday and actively working on authorization.  Will continue to follow and assist. [facility]   LCSW continues to follow for disposition. Calls placed to both daughters to discuss disposition and discharge to SNF.  Messages left for Johnny [name] and Johnny [name] (daughters involved in care).  Messages left on 6/5 and this morning 6/6.  Will await call back to discuss payment plan for SNF as patient has outstanding bills from previous stay.  Johnny EmoryHannah Aarvi Stotts LCSW, MSW Clinical Social Work: System [employer] [phone]
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
## 2015-09-26 ENCOUNTER — Other Ambulatory Visit: Payer: Self-pay | Admitting: *Deleted

## 2015-09-26 DIAGNOSIS — J449 Chronic obstructive pulmonary disease, unspecified: Secondary | ICD-10-CM | POA: Diagnosis not present

## 2015-09-26 DIAGNOSIS — I10 Essential (primary) hypertension: Secondary | ICD-10-CM | POA: Diagnosis not present

## 2015-09-26 DIAGNOSIS — E46 Unspecified protein-calorie malnutrition: Secondary | ICD-10-CM

## 2015-09-26 DIAGNOSIS — D638 Anemia in other chronic diseases classified elsewhere: Secondary | ICD-10-CM | POA: Diagnosis not present

## 2015-09-26 DIAGNOSIS — I2510 Atherosclerotic heart disease of native coronary artery without angina pectoris: Secondary | ICD-10-CM | POA: Diagnosis not present

## 2015-09-26 DIAGNOSIS — L8990 Pressure ulcer of unspecified site, unspecified stage: Secondary | ICD-10-CM

## 2015-09-26 DIAGNOSIS — J189 Pneumonia, unspecified organism: Principal | ICD-10-CM

## 2015-09-26 DIAGNOSIS — F1010 Alcohol abuse, uncomplicated: Secondary | ICD-10-CM

## 2015-09-26 DIAGNOSIS — F411 Generalized anxiety disorder: Secondary | ICD-10-CM | POA: Diagnosis not present

## 2015-09-26 DIAGNOSIS — R1314 Dysphagia, pharyngoesophageal phase: Secondary | ICD-10-CM | POA: Diagnosis not present

## 2015-09-26 DIAGNOSIS — I739 Peripheral vascular disease, unspecified: Secondary | ICD-10-CM | POA: Diagnosis not present

## 2015-09-26 DIAGNOSIS — Z9181 History of falling: Secondary | ICD-10-CM | POA: Diagnosis not present

## 2015-09-26 DIAGNOSIS — F10120 Alcohol abuse with intoxication, uncomplicated: Secondary | ICD-10-CM

## 2015-09-26 DIAGNOSIS — E785 Hyperlipidemia, unspecified: Secondary | ICD-10-CM | POA: Diagnosis not present

## 2015-09-26 DIAGNOSIS — I480 Paroxysmal atrial fibrillation: Secondary | ICD-10-CM | POA: Diagnosis not present

## 2015-09-26 DIAGNOSIS — R4182 Altered mental status, unspecified: Secondary | ICD-10-CM | POA: Diagnosis not present

## 2015-09-26 DIAGNOSIS — E440 Moderate protein-calorie malnutrition: Secondary | ICD-10-CM

## 2015-09-26 DIAGNOSIS — E0869 Diabetes mellitus due to underlying condition with other specified complication: Secondary | ICD-10-CM | POA: Diagnosis not present

## 2015-09-26 DIAGNOSIS — E1151 Type 2 diabetes mellitus with diabetic peripheral angiopathy without gangrene: Secondary | ICD-10-CM

## 2015-09-26 DIAGNOSIS — M6281 Muscle weakness (generalized): Secondary | ICD-10-CM | POA: Diagnosis not present

## 2015-09-26 DIAGNOSIS — E1169 Type 2 diabetes mellitus with other specified complication: Secondary | ICD-10-CM

## 2015-09-26 DIAGNOSIS — R531 Weakness: Secondary | ICD-10-CM

## 2015-09-26 DIAGNOSIS — R251 Tremor, unspecified: Secondary | ICD-10-CM | POA: Diagnosis not present

## 2015-09-26 LAB — BASIC METABOLIC PANEL
ANION GAP: 8 (ref 5–15)
BUN: 25 mg/dL — ABNORMAL HIGH (ref 6–20)
CALCIUM: 8.5 mg/dL — AB (ref 8.9–10.3)
CO2: 24 mmol/L (ref 22–32)
Chloride: 103 mmol/L (ref 101–111)
Creatinine, Ser: 1 mg/dL (ref 0.61–1.24)
GFR calc non Af Amer: >60 mL/min (ref >60)
Glucose, Bld: 133 mg/dL — ABNORMAL HIGH (ref 65–99)
POTASSIUM: 3.2 mmol/L — AB (ref 3.5–5.1)
SODIUM: 135 mmol/L (ref 135–145)

## 2015-09-26 LAB — GLUCOSE, CAPILLARY
GLUCOSE-CAPILLARY: 133 mg/dL — AB (ref 65–99)
GLUCOSE-CAPILLARY: 187 mg/dL — AB (ref 65–99)

## 2015-09-26 LAB — PROCALCITONIN

## 2015-09-26 MED ORDER — FOLIC ACID 1 MG PO TABS: 1.0000 mg | ORAL_TABLET | Freq: Every day | ORAL | Status: DC

## 2015-09-26 MED ORDER — GLUCERNA PO LIQD: 237.0000 mL | Freq: Two times a day (BID) | ORAL | Status: DC

## 2015-09-26 MED ORDER — METFORMIN HCL 500 MG PO TABS: 500.0000 mg | ORAL_TABLET | Freq: Two times a day (BID) | ORAL | Status: DC

## 2015-09-26 MED ORDER — THIAMINE HCL 100 MG PO TABS: 100.0000 mg | ORAL_TABLET | Freq: Every day | ORAL | Status: DC

## 2015-09-26 MED ORDER — CHLORDIAZEPOXIDE HCL 5 MG PO CAPS: 5.0000 mg | ORAL_CAPSULE | Freq: Two times a day (BID) | ORAL | Status: DC

## 2015-09-26 MED ORDER — FERROUS SULFATE 325 (65 FE) MG PO TBEC: 325.0000 mg | DELAYED_RELEASE_TABLET | Freq: Every day | ORAL | Status: DC

## 2015-09-26 MED ORDER — CHLORDIAZEPOXIDE HCL 5 MG PO CAPS: 5.0000 mg | ORAL_CAPSULE | ORAL | Status: AC

## 2015-09-26 NOTE — Patient Outreach (Signed)
Member recently admitted to hospital and diagnosed with pneumonia.  Discharged today to SNF.  Referral to social worker placed, this care manager will continue with involvement and start transition of care program once discharged.  Kemper DurieMonica Iridessa Harrow, [place]RN, MSN [facility] Care Management  [facility] Hospital[facility] Care Manager [phone]

## 2015-09-26 NOTE — Progress Notes (Addendum)
Update: LCSWA contacted facility about DC.  LCSWA informed patient and left voicemail with family about DC.  Patient fullcode. Scripts Signed by MD. Nurse report. [facility] called for transport.    [facility] with [facility] and family has managed finances for patient to return. Patient has insurance authorization at this time as well. Facility expecting patient at 2pm today for disposition.  LCSWA continues to follow for disposition. Calls placed to POA to discuss disposition and discharge to SNF-[facility] Messages left for Sylvie FarrierSuborena (daughter) [place]. Messages left on 6/7 @ 11:38am  Vivi BarrackNicole Jazlene Bares, Theresia MajorsLCSWA, MSW Clinical Social Worker 5E and Psychiatric Service Line [phone] 09/26/2015  11:38 AM

## 2015-09-26 NOTE — Discharge Summary (Signed)
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 airway/laryngeal closure;Reduced laryngeal elevation;Reduced anterior laryngeal mobility;Reduced tongue base retraction;Penetration/Aspiration during swallow;Trace aspiration;Penetration/Apiration after swallow;Pharyngeal residue - pyriform;Pharyngeal residue - valleculae Pharyngeal Material enters airway, passes BELOW cords then ejected out Pharyngeal- Puree Reduced pharyngeal peristalsis;Reduced epiglottic inversion;Reduced anterior laryngeal mobility;Reduced laryngeal elevation;Reduced airway/laryngeal closure;Reduced tongue base retraction;Pharyngeal residue - valleculae;Pharyngeal residue - pyriform Pharyngeal -- Pharyngeal- Mechanical Soft Reduced pharyngeal peristalsis;Reduced epiglottic inversion;Reduced anterior laryngeal mobility;Reduced laryngeal elevation;Reduced airway/laryngeal closure;Pharyngeal residue - valleculae;Pharyngeal residue - pyriform Pharyngeal -- Pharyngeal- Regular -- Pharyngeal -- Pharyngeal- Multi-consistency -- Pharyngeal -- Pharyngeal- Pill -- Pharyngeal -- Pharyngeal Comment head turn left, following solids with liquids, cough/"hock" expectorate helpful to decrease aspirate/penetrates and residuals  CHL IP CERVICAL  ESOPHAGEAL PHASE 09/25/2015 Cervical Esophageal Phase Impaired Pudding Teaspoon -- Pudding Cup -- Honey Teaspoon -- Honey Cup -- Nectar Teaspoon -- Nectar Cup -- Nectar Straw -- Thin Teaspoon -- Thin Cup -- Thin Straw -- Puree -- Mechanical Soft -- Regular -- Multi-consistency -- Pill -- Cervical Esophageal Comment decreased clearance through UES without pt sensation Donavan Burnet, MS [facility] SLP [phone]               Microbiology: No results found for this or any previous visit (from the past 240 hour(s)).   Labs: CBC:  Recent Labs Lab 09/21/15 2049 09/23/15 0452 09/24/15 0506  WBC 10.6* 9.9 9.2  HGB 10.7* 10.9* 11.0*  HCT 31.3* 31.8* 31.8*  MCV 94.8 92.2 92.2  PLT 99* 105* 108*   Basic Metabolic Panel:  Recent Labs Lab 09/21/15 2049 09/23/15 0452 09/24/15 0506 09/26/15 0615  NA 136 133* 135 135  K 4.3 3.9 3.7 3.2*  CL 106 99* 101 103  CO2 23 24 23 24   GLUCOSE 180* 110* 159* 133*  BUN 15 13 19  25*  CREATININE 0.95 0.86 0.98 1.00  CALCIUM 8.6* 8.4* 8.7* 8.5*   Liver Function Tests:  Recent Labs Lab 09/21/15 2049 09/23/15 0452  AST 27 24  ALT 18 14*  ALKPHOS 108 95  BILITOT 0.7 1.9*  PROT 7.8 7.1  ALBUMIN 3.5 2.9*   BNP (last 3 results)  Recent Labs  04/30/15 2206  BNP 39.5   CBG:  Recent Labs Lab 09/25/15 0721 09/25/15 1122 09/25/15 1629 09/25/15 2042 09/26/15 0729  GLUCAP 149* 214* 132* 150* 133*   Time spent: 30 minutes  Signed:  Kevonna Nolte  [facility]  09/26/2015  , 11:28 AM

## 2015-09-26 NOTE — Care Management Note (Signed)
Case Management Note  Patient Details  Name: Johnny [name] MRN: [id number] Date of Birth: [birth year]  Subjective/Objective:                    Action/Plan:d/c SNF.   Expected Discharge Date:                  Expected Discharge Plan:  Skilled Nursing Facility  In-House Referral:     Discharge planning Services  CM Consult  Post Acute Care Choice:  Home Health Choice offered to:  Patient  DME Arranged:   (none required per patient) DME Agency:     HH Arranged:    HH Agency:  [facility]  Status of Service:  Completed, signed off  Medicare Important Message Given:  Yes Date Medicare IM Given:    Medicare IM give by:    Date Additional Medicare IM Given:    Additional Medicare Important Message give by:     If discussed at Long Length of Stay Meetings, dates discussed:    Additional Comments:  Lanier ClamMahabir, Tyiana Hill, RN 09/26/2015, 11:00 AM

## 2015-09-27 ENCOUNTER — Telehealth: Payer: Self-pay | Admitting: General Practice

## 2015-09-27 NOTE — Telephone Encounter (Signed)
[facility]  Left message for patient to call office.  Appointment has been made with Dr. Amador CunasKwiatkowski on 6/16 @ 10:45.

## 2015-10-02 ENCOUNTER — Other Ambulatory Visit: Payer: Self-pay | Admitting: *Deleted

## 2015-10-02 NOTE — Patient Outreach (Signed)
[facility]) Care Management  10/02/2015  Jarvis NewcomerDan E Prom 05-Feb-1935 [id number]   Attempting to reach patient today for initial outreach attempt. CSW called both numbers listed and was unable to reach patient or voicemail option. Will try again later this week.   Reece LevyJanet Duana Benedict, MSW, LCSW Clinical Social Worker  [facility] [phone]

## 2015-10-03 ENCOUNTER — Telehealth: Payer: Self-pay | Admitting: General Practice

## 2015-10-03 NOTE — Progress Notes (Signed)
10/03/2015 A. Janai Maudlin RNCM 1910pm  EDCM to call patient for follow up on home health services, however patient was admitted and sent to SNF on discharge.

## 2015-10-04 ENCOUNTER — Other Ambulatory Visit: Payer: Self-pay | Admitting: *Deleted

## 2015-10-04 NOTE — Patient Outreach (Signed)
Call placed to [facility].  This care manager was informed that while member is a resident at the facility, he will be seen and managed by their physician.    Call then placed to PCP office to cancel scheduled appointment for tomorrow.  Will continue to follow up with LCSW to determine plan of care and discharge date.  Will assist with rescheduling appointment once discharged.  Kemper DurieMonica Harlo Fabela, CaliforniaRN, MSN [facility]  [facility] [phone]

## 2015-10-04 NOTE — Patient Outreach (Signed)
[facility]) Care Management  10/04/2015  Johnny [name] 29-Dec-1934 [id number]   Phone attempt #2 today to patient- message left for SNF dc [facility] as well. Hope to hear back from SNF rep tomorrow- and/or CSW will reach out to patient and SNF again.   Reece LevyJanet Adriell Polansky, MSW, LCSW Clinical Social Worker  [facility] [phone]

## 2015-10-05 ENCOUNTER — Other Ambulatory Visit: Payer: Self-pay | Admitting: *Deleted

## 2015-10-05 ENCOUNTER — Ambulatory Visit: Payer: Commercial Managed Care - HMO | Admitting: *Deleted

## 2015-10-05 ENCOUNTER — Ambulatory Visit: Payer: Commercial Managed Care - HMO | Admitting: Internal Medicine

## 2015-10-05 ENCOUNTER — Ambulatory Visit: Payer: Self-pay | Admitting: *Deleted

## 2015-10-05 NOTE — Patient Outreach (Signed)
  [facility]) Care Management  10/05/2015  Jarvis NewcomerDan E Blacksher 1934/12/04 [id number]   I have attempted to contact this patient by telephone, as well as have left 2 messages for SNF rep where patient is currently residing for rehab.  CSW will await callback and/or plan visit [facility] me[facility] week to check in and meet patient for services.   Reece LevyJanet Ottilie Wigglesworth, MSW, LCSW Clinical Social Worker  [facility] [phone]

## 2015-10-09 ENCOUNTER — Ambulatory Visit: Payer: Self-pay | Admitting: *Deleted

## 2015-10-19 ENCOUNTER — Encounter: Payer: Self-pay | Admitting: *Deleted

## 2015-10-22 ENCOUNTER — Other Ambulatory Visit: Payer: Self-pay | Admitting: *Deleted

## 2015-10-22 ENCOUNTER — Encounter: Payer: Self-pay | Admitting: *Deleted

## 2015-10-22 NOTE — Patient Outreach (Addendum)
[facility]) Care Management  10/22/2015  MENNO VANBERGEN 07-30-1934 [id number]  CSW was able to make contact with patient today to perform the initial assessment, as well as assess and assist with social work needs and services, when [place] met with patient at [facility] [place] where patient currently resides to receive short-term rehabilitative services.  CSW introduced self, explained role and types of services provided through [place] [facility] ([place] [facility]).  CSW further explained to patient that CSW works with patient's RNCM, also with [place] [facility], Valente David. CSW then explained the reason for the visit, indicating that [name] thought that patient would benefit from social work services and resources to assist with possible discharge planning needs and services from the skilled nursing facility where patient currently resides.  CSW obtained two HIPAA compliant identifiers from patient, which included patient's name and date of birth. Patient admits that he plans to return home to live at time of discharge from the skilled facility.  CSW tried to obtain a written consent from patient, but patient reported that he was unwilling to sign any document without his daughter, Earnest Conroy present.  CSW left the consent with patient, then contacted Mrs. Moraina to explain that CSW would need the consent signed before CSW would be able to proceed with offering case management services to patient.  Mrs. Moraina voiced understanding, indicating that she would be by the facility within the next few days to visit with patient and that she would have patient sign the consent at that time.  Mrs. Nevada Crane agreed that the plan is for patient to return home to live at time of discharge.  However, patient, nor Mrs. [place] Crane were aware of a tentative discharge date scheduled at this time.  CSW agreed to contact the discharge planning  coordinator at the skilled facility to discuss patient's discharge plan of care.   CSW will meet with patient in two weeks at the skilled nursing facility to attend patient's discharge planning meeting. Nat Christen, BSW, MSW, LCSW  Licensed Education officer, [facility]  Mailing [place] [address] [place], [place] [address] Physical Address-[address], [place], [place] [address] Toll Free Main # [phone] Fax # [phone] Cell # [phone]  Fax # [phone]  [email or web address]

## 2015-10-26 ENCOUNTER — Telehealth: Payer: Self-pay | Admitting: Internal Medicine

## 2015-10-26 DIAGNOSIS — I480 Paroxysmal atrial fibrillation: Secondary | ICD-10-CM | POA: Diagnosis not present

## 2015-10-26 DIAGNOSIS — I10 Essential (primary) hypertension: Secondary | ICD-10-CM | POA: Diagnosis not present

## 2015-10-26 DIAGNOSIS — E785 Hyperlipidemia, unspecified: Secondary | ICD-10-CM | POA: Diagnosis not present

## 2015-10-26 DIAGNOSIS — F411 Generalized anxiety disorder: Secondary | ICD-10-CM | POA: Diagnosis not present

## 2015-10-26 DIAGNOSIS — I2510 Atherosclerotic heart disease of native coronary artery without angina pectoris: Secondary | ICD-10-CM | POA: Diagnosis not present

## 2015-10-26 DIAGNOSIS — E0869 Diabetes mellitus due to underlying condition with other specified complication: Secondary | ICD-10-CM | POA: Diagnosis not present

## 2015-10-26 DIAGNOSIS — J449 Chronic obstructive pulmonary disease, unspecified: Secondary | ICD-10-CM | POA: Diagnosis not present

## 2015-10-26 DIAGNOSIS — I739 Peripheral vascular disease, unspecified: Secondary | ICD-10-CM | POA: Diagnosis not present

## 2015-10-26 NOTE — Telephone Encounter (Signed)
Yes

## 2015-10-26 NOTE — Telephone Encounter (Signed)
Dr. Kirtland BouchardK, will you sign orders for pt? Pt scheduled to see you 7/18.

## 2015-10-26 NOTE — Telephone Encounter (Signed)
Pt to be dc's from rehab tomorrow. Tanya with [facility] would like to know if Dr Kirtland BouchardK will be signing  pt's orders? Pt not seen since 03/23/2015.  Advised tanya pt needs to make Hospital/rehab follow up appointment.  Tanya scheduled the appointment (7/18) and will inform pt.

## 2015-10-28 DIAGNOSIS — E440 Moderate protein-calorie malnutrition: Secondary | ICD-10-CM | POA: Diagnosis not present

## 2015-10-28 DIAGNOSIS — E1151 Type 2 diabetes mellitus with diabetic peripheral angiopathy without gangrene: Secondary | ICD-10-CM | POA: Diagnosis not present

## 2015-10-28 DIAGNOSIS — R1314 Dysphagia, pharyngoesophageal phase: Secondary | ICD-10-CM | POA: Diagnosis not present

## 2015-10-28 DIAGNOSIS — J449 Chronic obstructive pulmonary disease, unspecified: Secondary | ICD-10-CM | POA: Diagnosis not present

## 2015-10-28 DIAGNOSIS — I2510 Atherosclerotic heart disease of native coronary artery without angina pectoris: Secondary | ICD-10-CM | POA: Diagnosis not present

## 2015-10-28 DIAGNOSIS — I426 Alcoholic cardiomyopathy: Secondary | ICD-10-CM | POA: Diagnosis not present

## 2015-10-28 DIAGNOSIS — F329 Major depressive disorder, single episode, unspecified: Secondary | ICD-10-CM | POA: Diagnosis not present

## 2015-10-28 DIAGNOSIS — I480 Paroxysmal atrial fibrillation: Secondary | ICD-10-CM | POA: Diagnosis not present

## 2015-10-28 DIAGNOSIS — F10288 Alcohol dependence with other alcohol-induced disorder: Secondary | ICD-10-CM | POA: Diagnosis not present

## 2015-10-29 ENCOUNTER — Telehealth: Payer: Self-pay | Admitting: Internal Medicine

## 2015-10-29 DIAGNOSIS — E440 Moderate protein-calorie malnutrition: Secondary | ICD-10-CM | POA: Diagnosis not present

## 2015-10-29 DIAGNOSIS — I480 Paroxysmal atrial fibrillation: Secondary | ICD-10-CM | POA: Diagnosis not present

## 2015-10-29 DIAGNOSIS — I2510 Atherosclerotic heart disease of native coronary artery without angina pectoris: Secondary | ICD-10-CM | POA: Diagnosis not present

## 2015-10-29 DIAGNOSIS — F10288 Alcohol dependence with other alcohol-induced disorder: Secondary | ICD-10-CM | POA: Diagnosis not present

## 2015-10-29 DIAGNOSIS — F329 Major depressive disorder, single episode, unspecified: Secondary | ICD-10-CM | POA: Diagnosis not present

## 2015-10-29 DIAGNOSIS — J449 Chronic obstructive pulmonary disease, unspecified: Secondary | ICD-10-CM | POA: Diagnosis not present

## 2015-10-29 DIAGNOSIS — E1151 Type 2 diabetes mellitus with diabetic peripheral angiopathy without gangrene: Secondary | ICD-10-CM | POA: Diagnosis not present

## 2015-10-29 DIAGNOSIS — I426 Alcoholic cardiomyopathy: Secondary | ICD-10-CM | POA: Diagnosis not present

## 2015-10-29 DIAGNOSIS — R1314 Dysphagia, pharyngoesophageal phase: Secondary | ICD-10-CM | POA: Diagnosis not present

## 2015-10-29 NOTE — Telephone Encounter (Signed)
Johnny [name] w/ [facility] needs skilled nursing orders for pt 2 wk / 3 1 wk / 3 3 prn visits  Also, pt's daughter cannot bring him until 3:45 due to her work schedule. Pt needs the 3:45 time slot  which I will need to use that 4 pm appt in order to do a 30 min.  Is that ok? (tues July 18)

## 2015-10-29 NOTE — Telephone Encounter (Signed)
Okay for Physical Therapy for pt? 

## 2015-10-29 NOTE — Telephone Encounter (Signed)
Spoke to Pecan Parkanya, told her Dr.K said yes he will sign orders for pt. Tanya verbalized understanding.

## 2015-10-29 NOTE — Telephone Encounter (Signed)
Radovan with [facility] would like verbal  OT orders 2 wk /4

## 2015-10-29 NOTE — Telephone Encounter (Signed)
Okay for Occupational Therapy?

## 2015-10-30 ENCOUNTER — Ambulatory Visit: Payer: Self-pay | Admitting: *Deleted

## 2015-10-30 ENCOUNTER — Telehealth: Payer: Self-pay | Admitting: Internal Medicine

## 2015-10-30 MED ORDER — METOPROLOL TARTRATE 50 MG PO TABS: ORAL_TABLET | ORAL | Status: DC

## 2015-10-30 NOTE — Telephone Encounter (Signed)
Spoke with Vernona RiegerLaura with [facility]. Verbal orders given for therapy 2wk/3 1wk/3 3 PRNs for patient, okay per Dr. Doreene NestK. Laura verbalized understanding.

## 2015-10-30 NOTE — Telephone Encounter (Signed)
Rx sent to pharmacy   

## 2015-10-30 NOTE — Telephone Encounter (Signed)
Called pt to confirm which daughter to speak with concerning appointment. Pt states it is Human resources officerharon.   Pt states he is out of his bp med metoprolol (LOPRESSOR) 50 MG tablet  [facility]

## 2015-10-30 NOTE — Telephone Encounter (Signed)
Left detailed message on voicemail, verbal orders Occupational Therapy 2 x's a week for 4 weeks okay for pt per Dr.K. Any questions please call office.

## 2015-10-30 NOTE — Telephone Encounter (Signed)
Okay for OT? ?

## 2015-10-30 NOTE — Telephone Encounter (Signed)
Okay for physical therapy.

## 2015-10-30 NOTE — Telephone Encounter (Signed)
Left message for daughter sharon that appointment 7/18 at 3:45 needs to be kept for pt.

## 2015-10-30 NOTE — Telephone Encounter (Signed)
Left message on voicemail to call office.  

## 2015-10-31 ENCOUNTER — Ambulatory Visit: Payer: Commercial Managed Care - HMO | Admitting: *Deleted

## 2015-10-31 DIAGNOSIS — F10288 Alcohol dependence with other alcohol-induced disorder: Secondary | ICD-10-CM | POA: Diagnosis not present

## 2015-10-31 DIAGNOSIS — E1151 Type 2 diabetes mellitus with diabetic peripheral angiopathy without gangrene: Secondary | ICD-10-CM | POA: Diagnosis not present

## 2015-10-31 DIAGNOSIS — E440 Moderate protein-calorie malnutrition: Secondary | ICD-10-CM | POA: Diagnosis not present

## 2015-10-31 DIAGNOSIS — I426 Alcoholic cardiomyopathy: Secondary | ICD-10-CM | POA: Diagnosis not present

## 2015-10-31 DIAGNOSIS — F329 Major depressive disorder, single episode, unspecified: Secondary | ICD-10-CM | POA: Diagnosis not present

## 2015-10-31 DIAGNOSIS — R1314 Dysphagia, pharyngoesophageal phase: Secondary | ICD-10-CM | POA: Diagnosis not present

## 2015-10-31 DIAGNOSIS — J449 Chronic obstructive pulmonary disease, unspecified: Secondary | ICD-10-CM | POA: Diagnosis not present

## 2015-10-31 DIAGNOSIS — I480 Paroxysmal atrial fibrillation: Secondary | ICD-10-CM | POA: Diagnosis not present

## 2015-10-31 DIAGNOSIS — I2510 Atherosclerotic heart disease of native coronary artery without angina pectoris: Secondary | ICD-10-CM | POA: Diagnosis not present

## 2015-11-02 ENCOUNTER — Telehealth: Payer: Self-pay | Admitting: Emergency Medicine

## 2015-11-02 ENCOUNTER — Telehealth: Payer: Self-pay | Admitting: Internal Medicine

## 2015-11-02 MED ORDER — ATORVASTATIN CALCIUM 20 MG PO TABS: ORAL_TABLET | ORAL | Status: DC

## 2015-11-02 NOTE — Telephone Encounter (Signed)
Johnny ShownDanny [name] from [facility] [phone]) has called needing a [employer] referral for home nursing. [employer] hasn't given authorization for home nursing. They have giving authorization for evaluation and physical therapy. The physical therapist said that when she visited the house that they have no A/C. The patients have insect bites all over their ankles and the house has a bad odor.  Deanna is the office manager is she is trying to get in touch with [employer] to get the home nursing authorized.

## 2015-11-02 NOTE — Telephone Encounter (Signed)
Medication sent in. 

## 2015-11-02 NOTE — Telephone Encounter (Signed)
Pt requested refill. Atorvastatin 20 MG tab, take one tablet by mouth every day. Last seen 03/23/15. Okay to refill??

## 2015-11-06 ENCOUNTER — Ambulatory Visit: Payer: Commercial Managed Care - HMO | Admitting: Internal Medicine

## 2015-11-06 ENCOUNTER — Ambulatory Visit (INDEPENDENT_AMBULATORY_CARE_PROVIDER_SITE_OTHER): Payer: Commercial Managed Care - HMO | Admitting: Internal Medicine

## 2015-11-06 ENCOUNTER — Encounter: Payer: Self-pay | Admitting: Internal Medicine

## 2015-11-06 VITALS — BP 154/64 | HR 67 | Temp 98.2°F | Ht 71.0 in | Wt 176.0 lb

## 2015-11-06 DIAGNOSIS — I10 Essential (primary) hypertension: Secondary | ICD-10-CM | POA: Diagnosis not present

## 2015-11-06 DIAGNOSIS — E1151 Type 2 diabetes mellitus with diabetic peripheral angiopathy without gangrene: Secondary | ICD-10-CM | POA: Diagnosis not present

## 2015-11-06 DIAGNOSIS — I2510 Atherosclerotic heart disease of native coronary artery without angina pectoris: Secondary | ICD-10-CM | POA: Diagnosis not present

## 2015-11-06 MED ORDER — METOPROLOL TARTRATE 50 MG PO TABS: 50.0000 mg | ORAL_TABLET | Freq: Two times a day (BID) | ORAL | Status: DC

## 2015-11-06 NOTE — Progress Notes (Signed)
Pre visit review using our clinic review tool, if applicable. No additional management support is needed unless otherwise documented below in the visit note. 

## 2015-11-06 NOTE — Patient Instructions (Addendum)
Limit your sodium (Salt) intake  Avoid all alcohol use  Return in 3 months for follow-up  Decrease metoprolol to 50 mg twice daily   Please check your hemoglobin A1c every 3 months

## 2015-11-06 NOTE — Telephone Encounter (Signed)
Johnny said that they have only given approval for initial visits, but not for the homehealth yet. They need an order written for the following with Dr. Charm RingsK's signature on it & faxed back to the number below to help speed up the process.  Each visit frequency: Skilled Nursing: 2 week 3 1 week 3   PT: 1 week 1  2 week 2  OT: 1 week 1 2 week 4  Fax: [phone] To Johnny [name]/home health.

## 2015-11-06 NOTE — Telephone Encounter (Signed)
Okay.  Please write and I will sign

## 2015-11-06 NOTE — Progress Notes (Signed)
Subjective:    Patient ID: Jarvis NewcomerDan E Noh, male    DOB: 12-Nov-1934, 81 y.o.   MRN: [id number]  HPI  Date of admission: 09/21/2015  Date of discharge: 09/26/2015    Discharge Diagnoses:  Principal Problem:  CAP (community acquired pneumonia) Active Problems:  Diabetes mellitus with peripheral vascular disease (HCC)  Anemia of chronic disease  Essential hypertension  Alcohol dependence s/p withdrawl   Atrial flutter (HCC)  Protein calorie malnutrition (HCC)  Weakness  Alcoholic cardiomyopathy (HCC)  Alcohol intoxication (HCC)  Pneumonia  Pressure ulcer  Malnutrition of moderate degree  Recommendations for Outpatient Follow-up:  1. This follow-up with PCP in one week with BMP as well as for management of diabetes     Lab Results  Component Value Date   HGBA1C 6.9* 09/23/2015   81 year old patient who is seen today in follow-up.  He has had another recent hospital discharge with acute alcoholic intoxication and community acquired pneumonia.  [facility] and has been home for 2 weeks.  He does live alone but does have 2 daughters in the area.  He is visited by friends frequently. He states that he has been abstinent since his return home. He and his daughter both feel that he has done well.  He continues to receive in-home OT and PT.  His weight has been stable and his appetite good.  He states that he has been mowing his lawn with a riding mower.  He has been followed closely by [facility] in patient out reach program.  Past Medical History  Diagnosis Date  . Acute alcoholic hepatitis 05/20/2010  . Anemia of other chronic disease 08/16/2007  . CAD (coronary artery disease)     a. s/p CABG, notes unclear - 1998 or 2003?  Marland Kitchen. Essential hypertension   . Hyperlipidemia   . ETOH abuse   . Chronic low back pain   . Pneumonia     "now; never before" (04/01/2012)  . Hallucination 04/01/2012    . Arthritis   . Altered mental status 04/01/2012  . Seizures (HCC) 04/01/2012  . Diabetes mellitus (HCC)   . History of stroke   . Paroxysmal atrial fibrillation (HCC)   . Paroxysmal atrial flutter (HCC)     a. s/p ablation 2007.  . Coagulopathy (HCC)   . Protein calorie malnutrition (HCC)   . PEA (Pulseless electrical activity) (HCC)     a. h/o PEA cardiac arrest 04/2012 (felt r/t PNA, acidosis, hypoxic resp failure in setting of alcohol withdrawal; required tracheostomy)     Social History   Social History  . Marital Status: Married    Spouse Name: N/A  . Number of Children: N/A  . Years of Education: N/A   Occupational History  . Not on file.   Social History Main Topics  . Smoking status: Former Smoker -- 0.50 packs/day for 17 years    Types: Cigarettes    Quit date: 04/22/1983  . Smokeless tobacco: Never Used     Comment: 04/01/2012 "quit smoking 20+ yr ago"  . Alcohol Use: 10.8 oz/week    2 Glasses of wine, 16 Shots of liquor per week     Comment: hx of ETOH abuse; 04/01/2012 "drink ~ 1 pint//wk; vodka; cup of wine/wk" 04/2015 40 oz wine per day  . Drug Use: No  . Sexual Activity: No   Other Topics Concern  . Not on file   Social History Narrative  Past Surgical History  Procedure Laterality Date  . Coronary artery bypass graft  ~ 2003    CABG X4  . Cataract extraction w/ intraocular lens  implant, bilateral      "years ago" (04/01/2012)  . Esophagogastroduodenoscopy  04/13/2012    Procedure: ESOPHAGOGASTRODUODENOSCOPY (EGD);  Surgeon: Florencia Reasons, MD;  [facility] END[facility]COPY;  Service: Endoscopy;  Laterality: N/A;  push peg  . Peg placement  04/13/2012    Procedure: PERCUTANEOUS ENDOSCOPIC GASTROSTOMY (PEG) PLACEMENT;  Surgeon: Florencia Reasons, MD;  Location: [facility] ENDOSCOPY;  Service: Endoscopy;  Laterality: N/A;    Family History  Problem Relation Age of Onset  . Heart failure Mother   . Aneurysm Sister   . Suicidality Brother   . Heart  failure Brother   . Diabetes Sister     No Known Allergies  Current Outpatient Prescriptions on File Prior to Visit  Medication Sig Dispense Refill  . ACCU-CHEK AVIVA PLUS test strip USE TO TEST BLOOD SUGAR ONCE A DAY 100 each 4  . ACCU-CHEK SOFTCLIX LANCETS lancets 1 each by Other route daily as needed for other. 100 each 12  . acetaminophen (TYLENOL) 325 MG tablet Take 2 tablets (650 mg total) by mouth every 6 (six) hours as needed for mild pain (or Fever >/= 101). 30 tablet 0  . aspirin 81 MG tablet Take 81 mg by mouth daily.    Marland Kitchen atorvastatin (LIPITOR) 20 MG tablet TAKE 1 TABLET (20 MG TOTAL) BY MOUTH DAILY. 90 tablet 0  . ferrous sulfate 325 (65 FE) MG EC tablet Take 1 tablet (325 mg total) by mouth daily with breakfast. 30 tablet 0  . folic acid (FOLVITE) 1 MG tablet Take 1 tablet (1 mg total) by mouth daily. 30 tablet 0  . GLUCERNA (GLUCERNA) LIQD Take 237 mLs by mouth 2 (two) times daily between meals. 14 Can 0  . ipratropium (ATROVENT) 0.02 % nebulizer solution Take 2.5 mLs (0.5 mg total) by nebulization every 4 (four) hours as needed for wheezing or shortness of breath. 75 mL 12  . levalbuterol (XOPENEX) 0.63 MG/3ML nebulizer solution Take 3 mLs (0.63 mg total) by nebulization every 4 (four) hours as needed for wheezing. 3 mL 12  . Maltodextrin-Xanthan Gum (RESOURCE THICKENUP CLEAR) POWD Take 120 g by mouth as needed. 1 Can 0  . metFORMIN (GLUCOPHAGE) 500 MG tablet Take 1 tablet (500 mg total) by mouth 2 (two) times daily with a meal. 30 tablet 0  . Multiple Vitamin (MULTIVITAMIN) tablet Take 1 tablet by mouth daily. Reported on 07/23/2015    . tamsulosin (FLOMAX) 0.4 MG CAPS capsule TAKE 1 CAPSULE (0.4 MG TOTAL) BY MOUTH DAILY. 90 capsule 3  . thiamine 100 MG tablet Take 1 tablet (100 mg total) by mouth daily. 30 tablet 0   No current facility-administered medications on file prior to visit.    BP 154/64 mmHg  Pulse 67  Temp(Src) 98.2 F (36.8 C) (Oral)  Ht 5\' 11"  (1.803 m)   Wt 176 lb (79.833 kg)  BMI 24.56 kg/m2  SpO2 98%      Review of Systems  Constitutional: Negative for fever, chills, appetite change and fatigue.  HENT: Negative for congestion, dental problem, ear pain, hearing loss, sore throat, tinnitus, trouble swallowing and voice change.   Eyes: Negative for pain, discharge and visual disturbance.  Respiratory: Negative for cough, chest tightness, wheezing and stridor.   Cardiovascular: Negative for chest pain, palpitations and leg swelling.  Gastrointestinal: Negative for nausea, vomiting,  abdominal pain, diarrhea, constipation, blood in stool and abdominal distention.  Genitourinary: Negative for urgency, hematuria, flank pain, discharge, difficulty urinating and genital sores.  Musculoskeletal: Negative for myalgias, back pain, joint swelling, arthralgias, gait problem and neck stiffness.  Skin: Negative for rash.  Neurological: Positive for weakness. Negative for dizziness, syncope, speech difficulty, numbness and headaches.  Hematological: Negative for adenopathy. Does not bruise/bleed easily.  Psychiatric/Behavioral: Negative for behavioral problems and dysphoric mood. The patient is not nervous/anxious.        Objective:   Physical Exam  Constitutional: He is oriented to person, place, and time. He appears well-developed. No distress.  Weight 176 Blood pressure difficult to auscultate  HENT:  Head: Normocephalic.  Right Ear: External ear normal.  Left Ear: External ear normal.  Eyes: Conjunctivae and EOM are normal.  Neck: Normal range of motion.  Cardiovascular: Normal rate and normal heart sounds.   Pulmonary/Chest: Breath sounds normal.  Abdominal: Bowel sounds are normal.  Musculoskeletal: Normal range of motion. He exhibits no edema or tenderness.  Neurological: He is alert and oriented to person, place, and time.  Psychiatric: He has a normal mood and affect. His behavior is normal.          Assessment & Plan:    Alcoholism Type 2 diabetes.  No change in regimen.  We'll follow-up hemoglobin A1c next visit Malnutrition.  Stable weight Alcoholic cardio myopathy History of community acquired pneumonia Deconditioning  We'll decrease metoprolol to 100 mg daily in divided dosages Otherwise, no change in medical regimen Total abstinence discussed and encouraged Follow-up [facility]  patient out reach  Rogelia Boga, MD

## 2015-11-06 NOTE — Telephone Encounter (Signed)
Orders written & rx signed. Orders faxed to [phone].

## 2015-11-08 ENCOUNTER — Other Ambulatory Visit: Payer: Self-pay | Admitting: *Deleted

## 2015-11-08 NOTE — Patient Outreach (Signed)
[place] [facility]) Care Management  11/08/2015  LEMAN MARTINEK 17-Apr-1935 [id number]  CSW was able to make contact with patient by phone today, after learning that patient no longer resides at [facility] [place] where patient was receiving short-term rehabilitative services.  CSW drove out to the facility, only to learn that patient had been discharged two weeks ago.  Patient reports that he has been back in the hospital since he and CSW last spoke, due to Pneumonia.  After thorough review of patient's EMR (Electronic Medical Record), CSW learned that patient was also admitted due to alcohol intoxification.  CSW inquired as to whether or not patient would be interested in receiving information on support groups, AA (Alcoholics Anonymous) Meetings with schedule and locations, as well as a referral to a psychiatrist and/or counselor.  Patient denied, indicating that he "currently has it under control".  CSW was able to ensure that patient has the correct contact information for CSW, encouraging patient to contact CSW if he changes his mind and wishes to receive information and/or support for his addition.  Patient currently lives alone and ensures CSW that he has been abstinent since returning home. Patient continues to receive in-home OT (Occupational Therapy) and PT (Physical Therapy) through a home health agency of choice. Patient admits that he has a great support system through family members (two daughters) and friends.  Patient reports being active, still mowing his lawn and gardening with his riding lawn mower and power tools.  Patient denies any additional social work needs at present.  CSW will perform a case closure on patient, as all goals of treatment have been met from social work standpoint and no additional social work needs have been identified at this time.  CSW will notify patient's RNCM with [place] of  CSW's plans to close patient's case.  CSW will fax an update to patient's Primary Care Physician, [name] to ensure that they are aware of CSW's involvement with patient's plan of care.  CSW will submit a case closure request to Verlon Setting, Care Management Assistant with [place] [facility], in the form of an [employer].  CSW will ensure that Mrs. Comer is aware of Roma Schanz, RNCM with [place] [facility], continued involvement with patient's care. Nat Christen, BSW, MSW, LCSW  Licensed Education officer, environmental Health System  Mailing [place] [address], [place], [place] [address] Physical Address-[address], [place], [place] [address] Toll Free Main # [phone] Fax # [phone] Cell # [phone]  Fax # [phone]  [email or web address]

## 2015-11-08 NOTE — Patient Outreach (Signed)
Notified by Child psychotherapistsocial worker that member was recently discharged from SNF/Rehab.  Call placed to member to initiate transition of care program.  He reports that he was discharged on 7/8.  He state that he saw his PCP on 7/18, and had one medication change.  Attempt made to review medications, he is unable/refuses to do so, repeatedly stating "I take all of my medicines, I got all of them."  He denies pain/discomfort, denies having a drink since before admission to the hospital.    This care manager inquired about home health involvement, he state that he had one visit but has not had another one since.  Inquired also about having the home fully treated for bed bugs, he state that the home was treated, and he has not seen any bugs since discharge.  Attempt made to complete transition of care assessment and review medications again, he states "I can't do this over the phone, somebody need to come over here" and abruptly ends call.  Call then placed to [facility] [place], to obtain update on involvement.  He state that the member was only ordered and originally approved for initial assessment by physical therapist, which was completed Deanna.  He state they have been trying to get orders and insurance approval for nursing, physical therapy, and occupational therapy for the past 2 weeks.  However, he is concerned if they will be able to take the case due to concerns for safety and housing conditions reported by Deanna.  He state that Deanna reported multiple bug/insect bites on her legs after leaving, and state that the home had a foul odor.  He still report that he is waiting for final approval and will make an assessment at the home personally.  He will follow up with this care manager after the assessment is done.  This care manager will follow up with member next week.  Kemper DurieMonica Salil Raineri, [place]RN, MSN [facility] Care Management  [facility] Manager [phone]

## 2015-11-09 ENCOUNTER — Telehealth: Payer: Self-pay | Admitting: Internal Medicine

## 2015-11-09 ENCOUNTER — Ambulatory Visit: Payer: Self-pay | Admitting: *Deleted

## 2015-11-09 DIAGNOSIS — F10288 Alcohol dependence with other alcohol-induced disorder: Secondary | ICD-10-CM | POA: Diagnosis not present

## 2015-11-09 DIAGNOSIS — J449 Chronic obstructive pulmonary disease, unspecified: Secondary | ICD-10-CM | POA: Diagnosis not present

## 2015-11-09 DIAGNOSIS — R1314 Dysphagia, pharyngoesophageal phase: Secondary | ICD-10-CM | POA: Diagnosis not present

## 2015-11-09 DIAGNOSIS — I426 Alcoholic cardiomyopathy: Secondary | ICD-10-CM | POA: Diagnosis not present

## 2015-11-09 DIAGNOSIS — F329 Major depressive disorder, single episode, unspecified: Secondary | ICD-10-CM | POA: Diagnosis not present

## 2015-11-09 DIAGNOSIS — E440 Moderate protein-calorie malnutrition: Secondary | ICD-10-CM | POA: Diagnosis not present

## 2015-11-09 DIAGNOSIS — I480 Paroxysmal atrial fibrillation: Secondary | ICD-10-CM | POA: Diagnosis not present

## 2015-11-09 DIAGNOSIS — I2510 Atherosclerotic heart disease of native coronary artery without angina pectoris: Secondary | ICD-10-CM | POA: Diagnosis not present

## 2015-11-09 DIAGNOSIS — E1151 Type 2 diabetes mellitus with diabetic peripheral angiopathy without gangrene: Secondary | ICD-10-CM | POA: Diagnosis not present

## 2015-11-09 MED ORDER — THIAMINE HCL 100 MG PO TABS: 100.0000 mg | ORAL_TABLET | Freq: Every day | ORAL | Status: DC

## 2015-11-09 MED ORDER — METFORMIN HCL 500 MG PO TABS: 500.0000 mg | ORAL_TABLET | Freq: Two times a day (BID) | ORAL | Status: DC

## 2015-11-09 NOTE — Telephone Encounter (Signed)
Rx's sent into pharmacy.  Multivitamin can be purchased OTC.

## 2015-11-09 NOTE — Telephone Encounter (Signed)
The patient was sent home without medication metFORMIN (GLUCOPHAGE) 500 MG tablet, thiamine 100 MG tablet, and Multiple Vitamin (MULTIVITAMIN) tablet  [id number] [place], [place] - [address] [phone] (Phone) [phone] (Fax)       I discussed with Dannielle HuhDanny that the multivitamin is most likely over the counter and he said that if it is not prescribed then the patient isn't going to make sure that he gets it.

## 2015-11-12 DIAGNOSIS — J449 Chronic obstructive pulmonary disease, unspecified: Secondary | ICD-10-CM | POA: Diagnosis not present

## 2015-11-12 DIAGNOSIS — E1151 Type 2 diabetes mellitus with diabetic peripheral angiopathy without gangrene: Secondary | ICD-10-CM | POA: Diagnosis not present

## 2015-11-12 DIAGNOSIS — I480 Paroxysmal atrial fibrillation: Secondary | ICD-10-CM | POA: Diagnosis not present

## 2015-11-12 DIAGNOSIS — F329 Major depressive disorder, single episode, unspecified: Secondary | ICD-10-CM | POA: Diagnosis not present

## 2015-11-12 DIAGNOSIS — I426 Alcoholic cardiomyopathy: Secondary | ICD-10-CM | POA: Diagnosis not present

## 2015-11-12 DIAGNOSIS — I2510 Atherosclerotic heart disease of native coronary artery without angina pectoris: Secondary | ICD-10-CM | POA: Diagnosis not present

## 2015-11-12 DIAGNOSIS — E440 Moderate protein-calorie malnutrition: Secondary | ICD-10-CM | POA: Diagnosis not present

## 2015-11-12 DIAGNOSIS — R1314 Dysphagia, pharyngoesophageal phase: Secondary | ICD-10-CM | POA: Diagnosis not present

## 2015-11-12 DIAGNOSIS — F10288 Alcohol dependence with other alcohol-induced disorder: Secondary | ICD-10-CM | POA: Diagnosis not present

## 2015-11-13 DIAGNOSIS — I426 Alcoholic cardiomyopathy: Secondary | ICD-10-CM | POA: Diagnosis not present

## 2015-11-13 DIAGNOSIS — F329 Major depressive disorder, single episode, unspecified: Secondary | ICD-10-CM | POA: Diagnosis not present

## 2015-11-13 DIAGNOSIS — R1314 Dysphagia, pharyngoesophageal phase: Secondary | ICD-10-CM | POA: Diagnosis not present

## 2015-11-13 DIAGNOSIS — E440 Moderate protein-calorie malnutrition: Secondary | ICD-10-CM | POA: Diagnosis not present

## 2015-11-13 DIAGNOSIS — I480 Paroxysmal atrial fibrillation: Secondary | ICD-10-CM | POA: Diagnosis not present

## 2015-11-13 DIAGNOSIS — J449 Chronic obstructive pulmonary disease, unspecified: Secondary | ICD-10-CM | POA: Diagnosis not present

## 2015-11-13 DIAGNOSIS — I2510 Atherosclerotic heart disease of native coronary artery without angina pectoris: Secondary | ICD-10-CM | POA: Diagnosis not present

## 2015-11-13 DIAGNOSIS — E1151 Type 2 diabetes mellitus with diabetic peripheral angiopathy without gangrene: Secondary | ICD-10-CM | POA: Diagnosis not present

## 2015-11-13 DIAGNOSIS — F10288 Alcohol dependence with other alcohol-induced disorder: Secondary | ICD-10-CM | POA: Diagnosis not present

## 2015-11-14 ENCOUNTER — Other Ambulatory Visit: Payer: Self-pay | Admitting: *Deleted

## 2015-11-14 DIAGNOSIS — F10288 Alcohol dependence with other alcohol-induced disorder: Secondary | ICD-10-CM | POA: Diagnosis not present

## 2015-11-14 DIAGNOSIS — J449 Chronic obstructive pulmonary disease, unspecified: Secondary | ICD-10-CM | POA: Diagnosis not present

## 2015-11-14 DIAGNOSIS — I426 Alcoholic cardiomyopathy: Secondary | ICD-10-CM | POA: Diagnosis not present

## 2015-11-14 DIAGNOSIS — R1314 Dysphagia, pharyngoesophageal phase: Secondary | ICD-10-CM | POA: Diagnosis not present

## 2015-11-14 DIAGNOSIS — E440 Moderate protein-calorie malnutrition: Secondary | ICD-10-CM | POA: Diagnosis not present

## 2015-11-14 DIAGNOSIS — F329 Major depressive disorder, single episode, unspecified: Secondary | ICD-10-CM | POA: Diagnosis not present

## 2015-11-14 DIAGNOSIS — E1151 Type 2 diabetes mellitus with diabetic peripheral angiopathy without gangrene: Secondary | ICD-10-CM | POA: Diagnosis not present

## 2015-11-14 DIAGNOSIS — I480 Paroxysmal atrial fibrillation: Secondary | ICD-10-CM | POA: Diagnosis not present

## 2015-11-14 DIAGNOSIS — I2510 Atherosclerotic heart disease of native coronary artery without angina pectoris: Secondary | ICD-10-CM | POA: Diagnosis not present

## 2015-11-14 NOTE — Patient Outreach (Signed)
Weekly transition of care call placed to member, no answer, HIPAA compliant voice message left.  Will await call back, if no call back, will follow up next week.  Call then placed to [facility] with [facility] to inquire on status of home assessment, no answer.  Voice message left, will await call back.  Kemper Durie, [place], MSN [facility] Care Management  [facility] Manager [phone]

## 2015-11-15 DIAGNOSIS — I480 Paroxysmal atrial fibrillation: Secondary | ICD-10-CM | POA: Diagnosis not present

## 2015-11-15 DIAGNOSIS — E1151 Type 2 diabetes mellitus with diabetic peripheral angiopathy without gangrene: Secondary | ICD-10-CM | POA: Diagnosis not present

## 2015-11-15 DIAGNOSIS — F10288 Alcohol dependence with other alcohol-induced disorder: Secondary | ICD-10-CM | POA: Diagnosis not present

## 2015-11-15 DIAGNOSIS — I426 Alcoholic cardiomyopathy: Secondary | ICD-10-CM | POA: Diagnosis not present

## 2015-11-15 DIAGNOSIS — I2510 Atherosclerotic heart disease of native coronary artery without angina pectoris: Secondary | ICD-10-CM | POA: Diagnosis not present

## 2015-11-15 DIAGNOSIS — F329 Major depressive disorder, single episode, unspecified: Secondary | ICD-10-CM | POA: Diagnosis not present

## 2015-11-15 DIAGNOSIS — E440 Moderate protein-calorie malnutrition: Secondary | ICD-10-CM | POA: Diagnosis not present

## 2015-11-15 DIAGNOSIS — R1314 Dysphagia, pharyngoesophageal phase: Secondary | ICD-10-CM | POA: Diagnosis not present

## 2015-11-15 DIAGNOSIS — J449 Chronic obstructive pulmonary disease, unspecified: Secondary | ICD-10-CM | POA: Diagnosis not present

## 2015-11-19 ENCOUNTER — Telehealth: Payer: Self-pay | Admitting: Internal Medicine

## 2015-11-19 NOTE — Telephone Encounter (Signed)
Pt needs wheel chair gel or gel/foam  cushion 17 in wide and 16 in deep fax order to [facility] [phone]. Please fax w/pt demographic

## 2015-11-20 ENCOUNTER — Telehealth: Payer: Self-pay | Admitting: Internal Medicine

## 2015-11-20 DIAGNOSIS — I480 Paroxysmal atrial fibrillation: Secondary | ICD-10-CM | POA: Diagnosis not present

## 2015-11-20 DIAGNOSIS — F329 Major depressive disorder, single episode, unspecified: Secondary | ICD-10-CM | POA: Diagnosis not present

## 2015-11-20 DIAGNOSIS — I426 Alcoholic cardiomyopathy: Secondary | ICD-10-CM | POA: Diagnosis not present

## 2015-11-20 DIAGNOSIS — F10288 Alcohol dependence with other alcohol-induced disorder: Secondary | ICD-10-CM | POA: Diagnosis not present

## 2015-11-20 DIAGNOSIS — E1151 Type 2 diabetes mellitus with diabetic peripheral angiopathy without gangrene: Secondary | ICD-10-CM | POA: Diagnosis not present

## 2015-11-20 DIAGNOSIS — R1314 Dysphagia, pharyngoesophageal phase: Secondary | ICD-10-CM | POA: Diagnosis not present

## 2015-11-20 DIAGNOSIS — E440 Moderate protein-calorie malnutrition: Secondary | ICD-10-CM | POA: Diagnosis not present

## 2015-11-20 DIAGNOSIS — J449 Chronic obstructive pulmonary disease, unspecified: Secondary | ICD-10-CM | POA: Diagnosis not present

## 2015-11-20 DIAGNOSIS — I2510 Atherosclerotic heart disease of native coronary artery without angina pectoris: Secondary | ICD-10-CM | POA: Diagnosis not present

## 2015-11-20 NOTE — Telephone Encounter (Signed)
[facility] states they cannot provide the wheelchair for pt because they are not not contracted with his [employer].

## 2015-11-20 NOTE — Telephone Encounter (Signed)
Faxed to [facility].

## 2015-11-20 NOTE — Telephone Encounter (Signed)
Rx written & ready for signature.

## 2015-11-20 NOTE — Telephone Encounter (Signed)
Called and spoke with [place] and was given another medical supply to send Rx to. Rx faxed to [facility].

## 2015-11-22 ENCOUNTER — Other Ambulatory Visit: Payer: Self-pay | Admitting: *Deleted

## 2015-11-22 DIAGNOSIS — F10288 Alcohol dependence with other alcohol-induced disorder: Secondary | ICD-10-CM | POA: Diagnosis not present

## 2015-11-22 DIAGNOSIS — E1151 Type 2 diabetes mellitus with diabetic peripheral angiopathy without gangrene: Secondary | ICD-10-CM | POA: Diagnosis not present

## 2015-11-22 DIAGNOSIS — J449 Chronic obstructive pulmonary disease, unspecified: Secondary | ICD-10-CM | POA: Diagnosis not present

## 2015-11-22 DIAGNOSIS — E440 Moderate protein-calorie malnutrition: Secondary | ICD-10-CM | POA: Diagnosis not present

## 2015-11-22 DIAGNOSIS — F329 Major depressive disorder, single episode, unspecified: Secondary | ICD-10-CM | POA: Diagnosis not present

## 2015-11-22 DIAGNOSIS — I2510 Atherosclerotic heart disease of native coronary artery without angina pectoris: Secondary | ICD-10-CM | POA: Diagnosis not present

## 2015-11-22 DIAGNOSIS — R1314 Dysphagia, pharyngoesophageal phase: Secondary | ICD-10-CM | POA: Diagnosis not present

## 2015-11-22 DIAGNOSIS — I480 Paroxysmal atrial fibrillation: Secondary | ICD-10-CM | POA: Diagnosis not present

## 2015-11-22 DIAGNOSIS — I426 Alcoholic cardiomyopathy: Secondary | ICD-10-CM | POA: Diagnosis not present

## 2015-11-22 NOTE — Patient Outreach (Signed)
[facility]) Care Management  11/22/2015  Johnny [name] December 22, 1934 [id number]   Weekly transition of care call placed to member.  He reports that he is doing the "best I can."  He state that he has been working with [place] from [facility], but denies a visit this week yet.  He then state that there is someone there now, places male on the phone.  Male identifies himself as the occupational therapist.  He state that the member has been showing improvement in functional status, and that he will continue to work with member over the next 1-2 weeks.  He then placed member back on the phone.  Member state that he has been taking his medications as prescribed, reports that he has been working with Dannielle Huh on checking his own blood pressure and blood sugar.  However, he does report still having some tremors.  Member denies any urgent concerns/needs, agrees to home visit next week.  Kemper Durie, [place], MSN [facility] Care Management  [facility] Manager [phone]

## 2015-11-23 DIAGNOSIS — E440 Moderate protein-calorie malnutrition: Secondary | ICD-10-CM | POA: Diagnosis not present

## 2015-11-23 DIAGNOSIS — I2510 Atherosclerotic heart disease of native coronary artery without angina pectoris: Secondary | ICD-10-CM | POA: Diagnosis not present

## 2015-11-23 DIAGNOSIS — R1314 Dysphagia, pharyngoesophageal phase: Secondary | ICD-10-CM | POA: Diagnosis not present

## 2015-11-23 DIAGNOSIS — J449 Chronic obstructive pulmonary disease, unspecified: Secondary | ICD-10-CM | POA: Diagnosis not present

## 2015-11-23 DIAGNOSIS — I426 Alcoholic cardiomyopathy: Secondary | ICD-10-CM | POA: Diagnosis not present

## 2015-11-23 DIAGNOSIS — I480 Paroxysmal atrial fibrillation: Secondary | ICD-10-CM | POA: Diagnosis not present

## 2015-11-23 DIAGNOSIS — F10288 Alcohol dependence with other alcohol-induced disorder: Secondary | ICD-10-CM | POA: Diagnosis not present

## 2015-11-23 DIAGNOSIS — F329 Major depressive disorder, single episode, unspecified: Secondary | ICD-10-CM | POA: Diagnosis not present

## 2015-11-23 DIAGNOSIS — E1151 Type 2 diabetes mellitus with diabetic peripheral angiopathy without gangrene: Secondary | ICD-10-CM | POA: Diagnosis not present

## 2015-11-27 ENCOUNTER — Telehealth: Payer: Self-pay | Admitting: Internal Medicine

## 2015-11-27 DIAGNOSIS — I480 Paroxysmal atrial fibrillation: Secondary | ICD-10-CM | POA: Diagnosis not present

## 2015-11-27 DIAGNOSIS — F10288 Alcohol dependence with other alcohol-induced disorder: Secondary | ICD-10-CM | POA: Diagnosis not present

## 2015-11-27 DIAGNOSIS — J449 Chronic obstructive pulmonary disease, unspecified: Secondary | ICD-10-CM | POA: Diagnosis not present

## 2015-11-27 DIAGNOSIS — I2510 Atherosclerotic heart disease of native coronary artery without angina pectoris: Secondary | ICD-10-CM | POA: Diagnosis not present

## 2015-11-27 DIAGNOSIS — E440 Moderate protein-calorie malnutrition: Secondary | ICD-10-CM | POA: Diagnosis not present

## 2015-11-27 DIAGNOSIS — I426 Alcoholic cardiomyopathy: Secondary | ICD-10-CM | POA: Diagnosis not present

## 2015-11-27 DIAGNOSIS — E1151 Type 2 diabetes mellitus with diabetic peripheral angiopathy without gangrene: Secondary | ICD-10-CM | POA: Diagnosis not present

## 2015-11-27 DIAGNOSIS — R1314 Dysphagia, pharyngoesophageal phase: Secondary | ICD-10-CM | POA: Diagnosis not present

## 2015-11-27 DIAGNOSIS — F329 Major depressive disorder, single episode, unspecified: Secondary | ICD-10-CM | POA: Diagnosis not present

## 2015-11-27 NOTE — Telephone Encounter (Signed)
Spoke with Rene [facility]oc[facility]gina at [facility]. Code provided and they will get the wheelchair cushion to the patient. She denied needing anymore information at this time.

## 2015-11-27 NOTE — Telephone Encounter (Signed)
Johnny [name] is OT with [facility] and states he ordered a wheelchair cushion for pt through Dr Marene LenzK [facility] called and states the order needs a dx code  One that will work would be E11.51  Also pt spends all day in his chair with no cushion . Pt at risk for sacral ulcers and pelvic dissalightment.  (he does not know this code)  Johnny [name] was going to dc pt this week, but waiting on the cushion. Christoper Allegrapria only needs the code at this time.  FYI: Pt has also started taking bisacodyl 5mg  for constipation

## 2015-11-28 ENCOUNTER — Other Ambulatory Visit: Payer: Self-pay | Admitting: Internal Medicine

## 2015-11-28 ENCOUNTER — Other Ambulatory Visit: Payer: Self-pay | Admitting: *Deleted

## 2015-11-28 NOTE — Patient Outreach (Signed)
[facility]) Care Management   11/28/2015  Johnny [name] 08-20-1934 [id number]  Johnny [name] is an 81 y.o. male  Subjective:   Member states he is "doing alright."  Denies any pain or discomfort at this time.  Objective:   Review of Systems  Constitutional: Negative.   HENT: Negative.   Eyes: Negative.   Respiratory: Negative.   Cardiovascular: Negative.   Gastrointestinal: Negative.   Genitourinary: Negative.   Musculoskeletal: Negative.   Skin: Negative.   Neurological: Positive for tremors.  Endo/Heme/Allergies: Negative.   Psychiatric/Behavioral: Negative.     Physical Exam  Constitutional: He is oriented to person, place, and time. He appears well-developed and well-nourished.  Neck: Normal range of motion.  Cardiovascular: Normal rate, regular rhythm and normal heart sounds.   Respiratory: Effort normal and breath sounds normal.  GI: Soft. Bowel sounds are normal.  Musculoskeletal: Normal range of motion.  Neurological: He is alert and oriented to person, place, and time.  Skin: Skin is warm and dry.    BP (!) 158/58 (BP Location: Left Arm, Patient Position: Sitting, Cuff Size: Normal)   Pulse 77   Resp 18   SpO2 96%    Encounter Medications:   Outpatient Encounter Prescriptions as of 11/28/2015  Medication Sig Note  . ACCU-CHEK AVIVA PLUS test strip USE TO TEST BLOOD SUGAR ONCE A DAY   . ACCU-CHEK SOFTCLIX LANCETS lancets 1 each by Other route daily as needed for other.   Marland Kitchen acetaminophen (TYLENOL) 325 MG tablet Take 2 tablets (650 mg total) by mouth every 6 (six) hours as needed for mild pain (or Fever >/= 101).   Marland Kitchen aspirin 81 MG tablet Take 81 mg by mouth daily.   Marland Kitchen atorvastatin (LIPITOR) 20 MG tablet TAKE 1 TABLET (20 MG TOTAL) BY MOUTH DAILY.   . ferrous sulfate 325 (65 FE) MG EC tablet Take 1 tablet (325 mg total) by mouth daily with breakfast.   . folic acid (FOLVITE) 1 MG tablet Take 1 tablet (1 mg total) by mouth daily.   . metFORMIN  (GLUCOPHAGE) 500 MG tablet Take 1 tablet (500 mg total) by mouth 2 (two) times daily with a meal.   . metoprolol (LOPRESSOR) 50 MG tablet Take 1 tablet (50 mg total) by mouth 2 (two) times daily.   . Multiple Vitamin (MULTIVITAMIN) tablet Take 1 tablet by mouth daily. Reported on 11/08/2015   . tamsulosin (FLOMAX) 0.4 MG CAPS capsule TAKE 1 CAPSULE (0.4 MG TOTAL) BY MOUTH DAILY. 09/21/2015: Pt states he took medication a few days ago but pharmacy has not filled anything since December 2016   . thiamine 100 MG tablet Take 1 tablet (100 mg total) by mouth daily.   Marland Kitchen GLUCERNA (GLUCERNA) LIQD Take 237 mLs by mouth 2 (two) times daily between meals.   Marland Kitchen ipratropium (ATROVENT) 0.02 % nebulizer solution Take 2.5 mLs (0.5 mg total) by nebulization every 4 (four) hours as needed for wheezing or shortness of breath. (Patient not taking: Reported on 11/28/2015)   . levalbuterol (XOPENEX) 0.63 MG/3ML nebulizer solution Take 3 mLs (0.63 mg total) by nebulization every 4 (four) hours as needed for wheezing. (Patient not taking: Reported on 11/28/2015)   . Maltodextrin-Xanthan Gum (RESOURCE THICKENUP CLEAR) POWD Take 120 g by mouth as needed. (Patient not taking: Reported on 11/28/2015)    No facility-administered encounter medications on file as of 11/28/2015.     Functional Status:   In your present state of health, do you have any  difficulty performing the following activities: 10/22/2015 09/22/2015  Hearing? N N  Vision? N N  Difficulty concentrating or making decisions? [name]  Walking or climbing stairs? Y Y  Dressing or bathing? N Y  Doing errands, shopping? [name]  Preparing Food and eating ? Y -  Using the Toilet? N -  In the past six months, have you accidently leaked urine? N -  Do you have problems with loss of bowel control? N -  Managing your Medications? Y -  Managing your Finances? Y -  Housekeeping or managing your Housekeeping? Y -  Some recent data might be hidden    Fall/Depression Screening:    PHQ  2/9 Scores 10/22/2015 06/15/2015 05/31/2015 05/17/2015 03/23/2015 03/02/2014 11/15/2012  PHQ - 2 Score 1 0 0 1 0 0 0    Assessment:    Met with member at scheduled time.  He continues to have tremors, but was able to demonstrate glucose check and blood pressure check. He state that he is in need of more test strips and lancets, call placed to pharmacy for refill.  Able to refill test strips, will have to obtain lancets over the counter due to expired prescription.  He is made aware and will inform his daughter to have her pick them up.  Patient was recently discharged from hospital and all medications have been reviewed.  He reports that he is taking his medications as prescribed, stating that he is filling the pill box on his own.  Box reviewed, noted that some medications were missing.  Assisted with filling the pill box.  He is aware of the number of pill that should be in his AM pocket versus his evening slots.  He is able to verbalize which meds are for blood pressure and which are for diabetes.  Educated on the use of the others.  Self-care seems to be improved as he is not as unkept as the last visit.  His home is in much better conditions with minimal clutter, although there continues to be an odor.  He denies seeing any more bed bugs.  He reports that he continue to have visits from the home health agency for nursing and occupational therapy as well as home health aide.  Member denies any concerns, advised to contact with any questions.  Plan:   Will follow up next week with transition of care call.  [facility] CM Care Plan Problem One   Flowsheet Row Most Recent Value  Care Plan Problem One  Risk for readmission to hospital related to complications of alcohol intoxication as evidenced by recent admission requring dischrge to rehab  Role Documenting the Problem One  Care Management [place] for Problem One  Active  [facility] Long Term Goal (31-90 days)  Member will not be readmitted to  hospital within the next 31 days  [facility] Long Term Goal Start Date  11/08/15  Interventions for Problem One Long Term Goal  Discussed with member the importance of following discharge instructions, including follow up appointments, medications, diet, and home health involvement, to decrease the risk of readmission  [facility] CM Short Term Goal #1 (0-30 days)  Member will report taking medications as prescribed over the next 4 weeks  [facility] CM Short Term Goal #1 Start Date  11/08/15  Interventions for Short Term Goal #1  Medications reviewed in the home with patient, assisted patient with filling pill box  [facility] CM Short Term Goal #2 (0-30 days)  Member will be involved  with home health services (nursing, PT/OT) over the next 4 weeks  [facility] CM Short Term Goal #2 Start Date  11/08/15  [facility] CM Short Term Goal #2 Met Date  11/22/15  Interventions for Short Term Goal #2  Discussed with member the importance of home health involvement.  Call placed to [facility] to confirm evaluation was complete     Valente David, RN, MSN [name] Manager [phone]

## 2015-11-29 ENCOUNTER — Telehealth: Payer: Self-pay | Admitting: Internal Medicine

## 2015-11-29 DIAGNOSIS — F329 Major depressive disorder, single episode, unspecified: Secondary | ICD-10-CM | POA: Diagnosis not present

## 2015-11-29 DIAGNOSIS — F10288 Alcohol dependence with other alcohol-induced disorder: Secondary | ICD-10-CM | POA: Diagnosis not present

## 2015-11-29 DIAGNOSIS — R1314 Dysphagia, pharyngoesophageal phase: Secondary | ICD-10-CM | POA: Diagnosis not present

## 2015-11-29 DIAGNOSIS — J449 Chronic obstructive pulmonary disease, unspecified: Secondary | ICD-10-CM | POA: Diagnosis not present

## 2015-11-29 DIAGNOSIS — I480 Paroxysmal atrial fibrillation: Secondary | ICD-10-CM | POA: Diagnosis not present

## 2015-11-29 DIAGNOSIS — I426 Alcoholic cardiomyopathy: Secondary | ICD-10-CM | POA: Diagnosis not present

## 2015-11-29 DIAGNOSIS — I2510 Atherosclerotic heart disease of native coronary artery without angina pectoris: Secondary | ICD-10-CM | POA: Diagnosis not present

## 2015-11-29 DIAGNOSIS — E440 Moderate protein-calorie malnutrition: Secondary | ICD-10-CM | POA: Diagnosis not present

## 2015-11-29 DIAGNOSIS — E1151 Type 2 diabetes mellitus with diabetic peripheral angiopathy without gangrene: Secondary | ICD-10-CM | POA: Diagnosis not present

## 2015-11-29 NOTE — Telephone Encounter (Signed)
° °  Rad[facility] call to say pt is being dc from pt and home health. He meant all his goals

## 2015-11-30 ENCOUNTER — Ambulatory Visit: Payer: Commercial Managed Care - HMO | Admitting: Internal Medicine

## 2015-12-04 ENCOUNTER — Other Ambulatory Visit: Payer: Self-pay | Admitting: *Deleted

## 2015-12-04 DIAGNOSIS — J449 Chronic obstructive pulmonary disease, unspecified: Secondary | ICD-10-CM | POA: Diagnosis not present

## 2015-12-04 DIAGNOSIS — I480 Paroxysmal atrial fibrillation: Secondary | ICD-10-CM | POA: Diagnosis not present

## 2015-12-04 DIAGNOSIS — E440 Moderate protein-calorie malnutrition: Secondary | ICD-10-CM | POA: Diagnosis not present

## 2015-12-04 DIAGNOSIS — E1151 Type 2 diabetes mellitus with diabetic peripheral angiopathy without gangrene: Secondary | ICD-10-CM | POA: Diagnosis not present

## 2015-12-04 NOTE — Patient Outreach (Signed)
[facility] ([facility]) Care Management  12/04/2015  Johnny NewcomerDan E [name] 01/12/1935 [id number]   Weekly transition [facility] placed to member, no answer.  HIPAA compliant voice message left.  Will await call back, if no call back will follow up next week.  Kemper DurieMonica Ashlee Bewley, [place]RN, MSN [facility] Care Management  [facility] Manager [phone]

## 2015-12-05 ENCOUNTER — Other Ambulatory Visit: Payer: Self-pay | Admitting: *Deleted

## 2015-12-05 NOTE — Patient Outreach (Signed)
[facility]) Care Management  12/05/2015  Johnny [name] 29-Jul-1934 [id number]   Call received back from member for transition of care.  He reports that he continues to improve and denies concerns at this time.  He state that he has been taking all of his medications as instructed, and has been checking his blood pressure and blood sugar.  He reports his blood sugar being "130 something" today and that his blood pressure was "fine."  He is unable to give specific numbers.  He state he has been discharged from the home health program and that his home aide make visits on Mon/Wed/Fri.  He denies any needs at this time, will follow up next week.  [name], [place]RN, MSN [facility] Care Management  [facility] Manager [phone]

## 2015-12-11 ENCOUNTER — Other Ambulatory Visit: Payer: Self-pay | Admitting: *Deleted

## 2015-12-11 NOTE — Patient Outreach (Signed)
[facility]) Care Management  12/11/2015  Johnny NewcomerDan E [name] 07-Jul-1934 [id number]   Weekly transition of care call placed to member, no answer.  HIPAA compliant voice message left.  Will await call back, will follow up next week if no call back.  Kemper DurieMonica Markus [name], [place]RN, MSN [facility] Care Management  [facility] Care Manager [phone]

## 2015-12-17 ENCOUNTER — Encounter: Payer: Self-pay | Admitting: *Deleted

## 2015-12-17 ENCOUNTER — Other Ambulatory Visit: Payer: Self-pay | Admitting: *Deleted

## 2015-12-17 NOTE — Patient Outreach (Signed)
[facility]) Care Management  12/17/2015  Johnny [name] 1935/01/15 [id number]   Call placed to member as a follow up to last week's unsuccessful call.  Member verifies identity, states that he is doing well, no concerns.  He reports that he has continued to check his blood sugar, blood pressure, and take his medications as prescribed.  He is made aware that his transition of care program is complete, goals met.  Discussed with member the options of continued involvement with [facility], he reports that he does not have any further needs at this time.  He continues to have a home health aide 3 days/week, and state that his friends/family continue to come frequently to check on his status.  He again denies the need for assisted living placement.    He is made aware that his case would be closed, and to contact [facility] in the future if his needs should change.  Will notify care management assistant and PCP of case closure.  Valente David, [place], MSN [place] [phone]

## 2016-01-09 ENCOUNTER — Telehealth: Payer: Self-pay | Admitting: Internal Medicine

## 2016-01-09 NOTE — Telephone Encounter (Signed)
Pt need new Rx for atorvastatin,tamsulosin and thiamine  [facility](Caller from pharmacy is Fannie KneeSue).

## 2016-01-10 NOTE — Telephone Encounter (Signed)
Left message on voicemail to call office. Need to clarify pharmacy request and pharmacy.

## 2016-01-15 ENCOUNTER — Other Ambulatory Visit: Payer: Self-pay | Admitting: Internal Medicine

## 2016-01-21 NOTE — Telephone Encounter (Signed)
Left message on voicemail to call office.  

## 2016-02-12 ENCOUNTER — Ambulatory Visit: Payer: Commercial Managed Care - HMO | Admitting: Internal Medicine

## 2016-02-26 ENCOUNTER — Ambulatory Visit: Payer: Commercial Managed Care - HMO | Admitting: Internal Medicine

## 2016-02-28 ENCOUNTER — Encounter: Payer: Self-pay | Admitting: Internal Medicine

## 2016-02-28 ENCOUNTER — Ambulatory Visit (INDEPENDENT_AMBULATORY_CARE_PROVIDER_SITE_OTHER): Payer: Commercial Managed Care - HMO | Admitting: Internal Medicine

## 2016-02-28 VITALS — BP 110/70 | HR 71 | Temp 97.7°F | Resp 20 | Ht 71.0 in | Wt 168.2 lb

## 2016-02-28 DIAGNOSIS — E1151 Type 2 diabetes mellitus with diabetic peripheral angiopathy without gangrene: Secondary | ICD-10-CM | POA: Diagnosis not present

## 2016-02-28 DIAGNOSIS — I10 Essential (primary) hypertension: Secondary | ICD-10-CM | POA: Diagnosis not present

## 2016-02-28 DIAGNOSIS — D638 Anemia in other chronic diseases classified elsewhere: Secondary | ICD-10-CM

## 2016-02-28 DIAGNOSIS — I2510 Atherosclerotic heart disease of native coronary artery without angina pectoris: Secondary | ICD-10-CM

## 2016-02-28 DIAGNOSIS — E878 Other disorders of electrolyte and fluid balance, not elsewhere classified: Secondary | ICD-10-CM | POA: Diagnosis not present

## 2016-02-28 DIAGNOSIS — J42 Unspecified chronic bronchitis: Secondary | ICD-10-CM

## 2016-02-28 MED ORDER — TAMSULOSIN HCL 0.4 MG PO CAPS: ORAL_CAPSULE | ORAL | 3 refills | Status: DC

## 2016-02-28 MED ORDER — ATORVASTATIN CALCIUM 20 MG PO TABS: ORAL_TABLET | ORAL | 1 refills | Status: DC

## 2016-02-28 MED ORDER — METOPROLOL TARTRATE 50 MG PO TABS: 50.0000 mg | ORAL_TABLET | Freq: Two times a day (BID) | ORAL | 1 refills | Status: DC

## 2016-02-28 MED ORDER — METFORMIN HCL 500 MG PO TABS
500.0000 mg | ORAL_TABLET | Freq: Two times a day (BID) | ORAL | 1 refills | Status: DC
Start: 2016-02-28 — End: 2016-07-31

## 2016-02-28 NOTE — Patient Instructions (Signed)
Limit your sodium (Salt) intake  Please check your blood pressure on a regular basis.  If it is consistently greater than 150/90, please make an office appointment.  Return in 4 months for follow-up  

## 2016-02-28 NOTE — Progress Notes (Signed)
Subjective:    Patient ID: Johnny [name], male    DOB: 05/27/1934, 81 y.o.   MRN: [id number]  HPI  81 year old patient who is seen today for follow-up.  He has a history of diabetes which has been well-controlled on metformin therapy. He has coronary artery disease, paroxysmal atrial fibrillation and a history of essential hypertension. He was hospitalized in June for critical illness related to alcoholism and community acquired pneumonia.  Does have some underlying COPD.  He was transferred to a skilled nursing facility for a period of time but continues to do well at home.  He states that he has been abstinent from alcohol since his hospital discharge  Past Medical History:  Diagnosis Date  . Acute alcoholic hepatitis 05/20/2010  . Altered mental status 04/01/2012  . Anemia of other chronic disease 08/16/2007  . Arthritis   . CAD (coronary artery disease)    a. s/p CABG, notes unclear - 1998 or 2003?  Marland Kitchen. Chronic low back pain   . Coagulopathy (HCC)   . Diabetes mellitus (HCC)   . Essential hypertension   . ETOH abuse   . Hallucination 04/01/2012  . History of stroke   . Hyperlipidemia   . Paroxysmal atrial fibrillation (HCC)   . Paroxysmal atrial flutter (HCC)    a. s/p ablation 2007.  Marland Kitchen. PEA (Pulseless electrical activity) (HCC)    a. h/o PEA cardiac arrest 04/2012 (felt r/t PNA, acidosis, hypoxic resp failure in setting of alcohol withdrawal; required tracheostomy)  . Pneumonia    "now; never before" (04/01/2012)  . Protein calorie malnutrition (HCC)   . Seizures (HCC) 04/01/2012     Social History   Social History  . Marital status: Married    Spouse name: N/A  . Number of children: N/A  . Years of education: N/A   Occupational History  . Not on file.   Social History Main Topics  . Smoking status: Former Smoker    Packs/day: 0.50    Years: 17.00    Types: Cigarettes    Quit date: 04/22/1983  . Smokeless tobacco: Never Used     Comment: 04/01/2012 "quit smoking 20+  yr ago"  . Alcohol use 10.8 oz/week    2 Glasses of wine, 16 Shots of liquor per week     Comment: hx of ETOH abuse; 04/01/2012 "drink ~ 1 pint//wk; vodka; cup of wine/wk" 04/2015 40 oz wine per day  . Drug use: No  . Sexual activity: No   Other Topics Concern  . Not on file   Social History Narrative  . No narrative on file    Past Surgical History:  Procedure Laterality Date  . CATARACT EXTRACTION W/ INTRAOCULAR LENS  IMPLANT, BILATERAL     "years ago" (04/01/2012)  . CORONARY ARTERY BYPASS GRAFT  ~ 2003   CABG X4  . ESOPHAGOGASTRODUODENOSCOPY  04/13/2012   Procedure: ESOPHAGOGASTRODUODENOSCOPY (EGD);  Surgeon: Florencia Reasonsobert V Buccini, MD;  Location: [facility];  Service: Endoscopy;  Laterality: N/A;  push peg  . PEG PLACEMENT  04/13/2012   Procedure: PERCUTANEOUS ENDOSCOPIC GASTROSTOMY (PEG) PLACEMENT;  Surgeon: Florencia Reasonsobert V Buccini, MD;  Location: [facility] ENDOSCOPY;  Service: Endoscopy;  Laterality: N/A;    Family History  Problem Relation Age of Onset  . Heart failure Mother   . Aneurysm Sister   . Suicidality Brother   . Heart failure Brother   . Diabetes Sister     No Known Allergies  Current Outpatient Prescriptions on File Prior to Visit  Medication Sig Dispense Refill  . ACCU-CHEK AVIVA PLUS test strip USE TO TEST BLOOD SUGAR ONCE A DAY 100 each 4  . ACCU-CHEK SOFTCLIX LANCETS lancets USE 1 EACH BY OTHER ROUTE DAILY AS NEEDED FOR OTHER. 100 each 0  . acetaminophen (TYLENOL) 325 MG tablet Take 2 tablets (650 mg total) by mouth every 6 (six) hours as needed for mild pain (or Fever >/= 101). 30 tablet 0  . aspirin 81 MG tablet Take 81 mg by mouth daily.    . Multiple Vitamin (MULTIVITAMIN) tablet Take 1 tablet by mouth daily. Reported on 11/08/2015     No current facility-administered medications on file prior to visit.     BP 110/70 (BP Location: Right Arm, Patient Position: Sitting, Cuff Size: Normal)   Pulse 71   Temp 97.7 F (36.5 C) (Oral)   Resp 20   Ht 5\' 11"  (1.803  m)   Wt 168 lb 4 oz (76.3 kg)   SpO2 96%   BMI 23.47 kg/m     Review of Systems  Constitutional: Positive for fatigue. Negative for appetite change, chills and fever.  HENT: Negative for congestion, dental problem, ear pain, hearing loss, sore throat, tinnitus, trouble swallowing and voice change.   Eyes: Negative for pain, discharge and visual disturbance.  Respiratory: Negative for cough, chest tightness, wheezing and stridor.   Cardiovascular: Negative for chest pain, palpitations and leg swelling.  Gastrointestinal: Negative for abdominal distention, abdominal pain, blood in stool, constipation, diarrhea, nausea and vomiting.  Genitourinary: Negative for difficulty urinating, discharge, flank pain, genital sores, hematuria and urgency.  Musculoskeletal: Negative for arthralgias, back pain, gait problem, joint swelling, myalgias and neck stiffness.  Skin: Negative for rash.  Neurological: Positive for tremors and weakness. Negative for dizziness, syncope, speech difficulty, numbness and headaches.  Hematological: Negative for adenopathy. Does not bruise/bleed easily.  Psychiatric/Behavioral: Negative for behavioral problems and dysphoric mood. The patient is not nervous/anxious.        Objective:   Physical Exam  Constitutional: He is oriented to person, place, and time. He appears well-developed.  Appears weak and chronically ill Blood pressure low normal  Wt Readings from Last 3 Encounters: 02/28/16 : 168 lb 4 oz (76.3 kg) 11/06/15 : 176 lb (79.8 kg) 09/22/15 : 174 lb 6.4 oz (79.1 kg)  HENT:  Head: Normocephalic.  Right Ear: External ear normal.  Left Ear: External ear normal.  Poor dental hygiene  Eyes: Conjunctivae and EOM are normal.  Neck: Normal range of motion.  Cardiovascular: Normal rate and normal heart sounds.   Pulmonary/Chest: Breath sounds normal. No respiratory distress. He has no wheezes.  Abdominal: Bowel sounds are normal.  Musculoskeletal: Normal  range of motion. He exhibits no edema or tenderness.  Neurological: He is alert and oriented to person, place, and time.  Resting tremor of the hands  Psychiatric: He has a normal mood and affect. His behavior is normal.          Assessment & Plan:   Diabetes mellitus.  Will check a hemoglobin A1c Essential hypertension, stable Coronary artery disease Alcoholism.  Remains abstinent Dyslipidemia.  Continue statin therapy COPD stable   Review laboratory update Flu vaccine administered Follow-up 4-6 months  Johnny [name]

## 2016-02-28 NOTE — Progress Notes (Signed)
Pre visit review using our clinic review tool, if applicable. No additional management support is needed unless otherwise documented below in the visit note. 

## 2016-02-29 LAB — COMPREHENSIVE METABOLIC PANEL
ALBUMIN: 4.2 g/dL (ref 3.5–5.2)
ALK PHOS: 93 U/L (ref 39–117)
ALT: 12 U/L (ref 0–53)
AST: 17 U/L (ref 0–37)
BUN: 21 mg/dL (ref 6–23)
CHLORIDE: 105 meq/L (ref 96–112)
CO2: 30 mEq/L (ref 19–32)
Calcium: 9.9 mg/dL (ref 8.4–10.5)
Creatinine, Ser: 1.16 mg/dL (ref 0.40–1.50)
GFR: 78.05 mL/min (ref >60.00)
Glucose, Bld: 101 mg/dL — ABNORMAL HIGH (ref 70–99)
POTASSIUM: 4.9 meq/L (ref 3.5–5.1)
Sodium: 144 mEq/L (ref 135–145)
TOTAL PROTEIN: 7.7 g/dL (ref 6.0–8.3)
Total Bilirubin: 0.7 mg/dL (ref 0.2–1.2)

## 2016-02-29 LAB — HEMOGLOBIN A1C: HEMOGLOBIN A1C: 5.9 % (ref 4.6–6.5)

## 2016-02-29 LAB — CBC WITH DIFFERENTIAL/PLATELET
Basophils Absolute: 0 10*3/uL (ref 0.0–0.1)
Basophils Relative: 0.2 % (ref 0.0–3.0)
Eosinophils Absolute: 0.5 10*3/uL (ref 0.0–0.7)
Eosinophils Relative: 6 % — ABNORMAL HIGH (ref 0.0–5.0)
HCT: 33 % — ABNORMAL LOW (ref 39.0–52.0)
Hemoglobin: 10.9 g/dL — ABNORMAL LOW (ref 13.0–17.0)
Lymphocytes Relative: 41.3 % (ref 12.0–46.0)
Lymphs Abs: 3.4 10*3/uL (ref 0.7–4.0)
MCHC: 33.1 g/dL (ref 30.0–36.0)
MCV: 93.8 fl (ref 78.0–100.0)
Monocytes Absolute: 0.8 10*3/uL (ref 0.1–1.0)
Monocytes Relative: 9.6 % (ref 3.0–12.0)
Neutro Abs: 3.5 10*3/uL (ref 1.4–7.7)
Neutrophils Relative %: 42.9 % — ABNORMAL LOW (ref 43.0–77.0)
Platelets: 146 10*3/uL — ABNORMAL LOW (ref 150.0–400.0)
RBC: 3.51 Mil/uL — ABNORMAL LOW (ref 4.22–5.81)
RDW: 14.7 % (ref 11.5–15.5)
WBC: 8.2 10*3/uL (ref 4.0–10.5)

## 2016-02-29 LAB — TSH: TSH: 4.34 u[IU]/mL (ref 0.35–4.50)

## 2016-03-01 ENCOUNTER — Other Ambulatory Visit: Payer: Self-pay | Admitting: Internal Medicine

## 2016-03-10 ENCOUNTER — Other Ambulatory Visit: Payer: Self-pay | Admitting: Internal Medicine

## 2016-03-10 NOTE — Telephone Encounter (Signed)
Pt would like to know if he is suppose to be taking the following if so please order.  Pt need new Rx for folic acid 1 MG, and ferrous sulfate 325 MG  Pharm:  [facility] [address]

## 2016-03-10 NOTE — Telephone Encounter (Signed)
Okay to discontinue iron and folic acid after present supply

## 2016-03-10 NOTE — Telephone Encounter (Signed)
Dr.K, is pt suppose to be taking Iron and Folic Acid?

## 2016-03-11 NOTE — Telephone Encounter (Signed)
Spoke with pt and informed him to discontinue the iron and folic acid after he finishes present supply. Pt understood verbalized.

## 2016-06-24 ENCOUNTER — Encounter: Payer: Self-pay | Admitting: Internal Medicine

## 2016-06-24 ENCOUNTER — Ambulatory Visit (INDEPENDENT_AMBULATORY_CARE_PROVIDER_SITE_OTHER): Payer: Medicare HMO | Admitting: Internal Medicine

## 2016-06-24 VITALS — BP 142/72 | HR 75 | Temp 97.7°F | Ht 71.0 in | Wt 168.2 lb

## 2016-06-24 DIAGNOSIS — R531 Weakness: Secondary | ICD-10-CM

## 2016-06-24 DIAGNOSIS — E1151 Type 2 diabetes mellitus with diabetic peripheral angiopathy without gangrene: Secondary | ICD-10-CM | POA: Diagnosis not present

## 2016-06-24 DIAGNOSIS — I10 Essential (primary) hypertension: Secondary | ICD-10-CM

## 2016-06-24 DIAGNOSIS — G9340 Encephalopathy, unspecified: Secondary | ICD-10-CM

## 2016-06-24 LAB — HEMOGLOBIN A1C: HEMOGLOBIN A1C: 6 % (ref 4.6–6.5)

## 2016-06-24 NOTE — Progress Notes (Signed)
Subjective:    Patient ID: Johnny [name], male    DOB: 12-Mar-1935, 82 y.o.   MRN: [id number]  HPI  82 year old patient who is seen today for follow-up of diabetes.  He has a history of COPD and essential hypertension.  He has a history of prior alcohol abuse. He has done fairly well over the past few months  Lab Results  Component Value Date   HGBA1C 5.9 02/28/2016    No focal concerns or complaints Remains quite weak and uses a walker. No recent falls  Diabetic eye examination encouraged  Past Medical History:  Diagnosis Date  . Acute alcoholic hepatitis 05/20/2010  . Altered mental status 04/01/2012  . Anemia of other chronic disease 08/16/2007  . Arthritis   . CAD (coronary artery disease)    a. s/p CABG, notes unclear - 1998 or 2003?  Marland Kitchen Chronic low back pain   . Coagulopathy (HCC)   . Diabetes mellitus (HCC)   . Essential hypertension   . ETOH abuse   . Hallucination 04/01/2012  . History of stroke   . Hyperlipidemia   . Paroxysmal atrial fibrillation (HCC)   . Paroxysmal atrial flutter (HCC)    a. s/p ablation 2007.  Marland Kitchen PEA (Pulseless electrical activity) (HCC)    a. h/o PEA cardiac arrest 04/2012 (felt r/t PNA, acidosis, hypoxic resp failure in setting of alcohol withdrawal; required tracheostomy)  . Pneumonia    "now; never before" (04/01/2012)  . Protein calorie malnutrition (HCC)   . Seizures (HCC) 04/01/2012     Social History   Social History  . Marital status: Married    Spouse name: N/A  . Number of children: N/A  . Years of education: N/A   Occupational History  . Not on file.   Social History Main Topics  . Smoking status: Former Smoker    Packs/day: 0.50    Years: 17.00    Types: Cigarettes    Quit date: 04/22/1983  . Smokeless tobacco: Never Used     Comment: 04/01/2012 "quit smoking 20+ yr ago"  . Alcohol use 10.8 oz/week    2 Glasses of wine, 16 Shots of liquor per week     Comment: hx of ETOH abuse; 04/01/2012 "drink ~ 1 pint//wk;  vodka; cup of wine/wk" 04/2015 40 oz wine per day  . Drug use: No  . Sexual activity: No   Other Topics Concern  . Not on file   Social History Narrative  . No narrative on file    Past Surgical History:  Procedure Laterality Date  . CATARACT EXTRACTION W/ INTRAOCULAR LENS  IMPLANT, BILATERAL     "years ago" (04/01/2012)  . CORONARY ARTERY BYPASS GRAFT  ~ 2003   CABG X4  . ESOPHAGOGASTRODUODENOSCOPY  04/13/2012   Procedure: ESOPHAGOGASTRODUODENOSCOPY (EGD);  Surgeon: Florencia Reasons, MD;  Location: [facility] ENDOSCOPY;  Service: Endoscopy;  Laterality: N/A;  push peg  . PEG PLACEMENT  04/13/2012   Procedure: PERCUTANEOUS ENDOSCOPIC GASTROSTOMY (PEG) PLACEMENT;  Surgeon: Florencia Reasons, MD;  Location: [facility] ENDOSCOPY;  Service: Endoscopy;  Laterality: N/A;    Family History  Problem Relation Age of Onset  . Heart failure Mother   . Aneurysm Sister   . Suicidality Brother   . Heart failure Brother   . Diabetes Sister     No Known Allergies  Current Outpatient Prescriptions on File Prior to Visit  Medication Sig Dispense Refill  . ACCU-CHEK AVIVA PLUS test strip USE TO TEST BLOOD SUGAR ONCE A  DAY 100 each 4  . ACCU-CHEK SOFTCLIX LANCETS lancets USE 1 EACH BY OTHER ROUTE DAILY AS NEEDED FOR OTHER. 100 each 0  . acetaminophen (TYLENOL) 325 MG tablet Take 2 tablets (650 mg total) by mouth every 6 (six) hours as needed for mild pain (or Fever >/= 101). 30 tablet 0  . aspirin 81 MG tablet Take 81 mg by mouth daily.    Marland Kitchen. atorvastatin (LIPITOR) 20 MG tablet TAKE 1 TABLET (20 MG TOTAL) BY MOUTH DAILY. 90 tablet 1  . metFORMIN (GLUCOPHAGE) 500 MG tablet Take 1 tablet (500 mg total) by mouth 2 (two) times daily with a meal. 180 tablet 1  . metoprolol (LOPRESSOR) 50 MG tablet TAKE 1.5 TABLETS (75 MG TOTAL) BY MOUTH 2 (TWO) TIMES DAILY. 180 tablet 1  . Multiple Vitamin (MULTIVITAMIN) tablet Take 1 tablet by mouth daily. Reported on 11/08/2015    . tamsulosin (FLOMAX) 0.4 MG CAPS capsule TAKE 1  CAPSULE (0.4 MG TOTAL) BY MOUTH DAILY. 90 capsule 3   No current facility-administered medications on file prior to visit.     BP (!) 142/72 (BP Location: Left Arm, Patient Position: Sitting, Cuff Size: Normal)   Pulse 75   Temp 97.7 F (36.5 C) (Oral)   Ht 5\' 11"  (1.803 m)   Wt 168 lb 3.2 oz (76.3 kg)   SpO2 94%   BMI 23.46 kg/m     Review of Systems  Constitutional: Negative for appetite change, chills, fatigue and fever.  HENT: Negative for congestion, dental problem, ear pain, hearing loss, sore throat, tinnitus, trouble swallowing and voice change.   Eyes: Negative for pain, discharge and visual disturbance.  Respiratory: Negative for cough, chest tightness, wheezing and stridor.   Cardiovascular: Negative for chest pain, palpitations and leg swelling.  Gastrointestinal: Negative for abdominal distention, abdominal pain, blood in stool, constipation, diarrhea, nausea and vomiting.  Genitourinary: Negative for difficulty urinating, discharge, flank pain, genital sores, hematuria and urgency.  Musculoskeletal: Positive for gait problem. Negative for arthralgias, back pain, joint swelling, myalgias and neck stiffness.  Skin: Negative for rash.  Neurological: Positive for weakness. Negative for dizziness, syncope, speech difficulty, numbness and headaches.  Hematological: Negative for adenopathy. Does not bruise/bleed easily.  Psychiatric/Behavioral: Negative for behavioral problems and dysphoric mood. The patient is not nervous/anxious.        Objective:   Physical Exam  Constitutional: He is oriented to person, place, and time. He appears well-developed.  Remains weak and debilitated. Has a difficult time standing from a sitting position even with the help of his walker Blood pressure 130/70  HENT:  Head: Normocephalic.  Right Ear: External ear normal.  Left Ear: External ear normal.  Eyes: Conjunctivae and EOM are normal.  Neck: Normal range of motion.    Cardiovascular: Normal rate and normal heart sounds.   Pulmonary/Chest: Breath sounds normal.  Abdominal: Bowel sounds are normal.  Musculoskeletal: Normal range of motion. He exhibits no edema or tenderness.  Neurological: He is alert and oriented to person, place, and time.  Psychiatric: He has a normal mood and affect. His behavior is normal.          Assessment & Plan:   Diabetes mellitus.  Will recheck hemoglobin A1c.  Very well controlled.  3 months ago.  No change in therapy Essential hypertension, stable General debility and high fall risk History of cold dependence  Check hemoglobin A1c No change therapy  Follow-up 3-6 months depending on hemoglobin A1c  Rogelia BogaKWIATKOWSKI,Staci Carver FRANK

## 2016-06-24 NOTE — Progress Notes (Signed)
Pre visit review using our clinic review tool, if applicable. No additional management support is needed unless otherwise documented below in the visit note. 

## 2016-06-24 NOTE — Patient Instructions (Signed)
Limit your sodium (Salt) intake   Please check your hemoglobin A1c every 3 months    It is important that you exercise regularly, at least 20 minutes 3 to 4 times per week.  If you develop chest pain or shortness of breath seek  medical attention.   

## 2016-07-31 ENCOUNTER — Other Ambulatory Visit: Payer: Self-pay | Admitting: Internal Medicine

## 2016-09-10 ENCOUNTER — Other Ambulatory Visit: Payer: Self-pay | Admitting: Internal Medicine

## 2016-10-11 ENCOUNTER — Other Ambulatory Visit: Payer: Self-pay | Admitting: Internal Medicine

## 2016-11-16 ENCOUNTER — Encounter (HOSPITAL_COMMUNITY): Payer: Self-pay

## 2016-11-16 ENCOUNTER — Emergency Department (HOSPITAL_COMMUNITY): Payer: Medicare HMO

## 2016-11-16 ENCOUNTER — Inpatient Hospital Stay (HOSPITAL_COMMUNITY)
Admission: EM | Admit: 2016-11-16 | Discharge: 2016-11-18 | DRG: 281 | Disposition: A | Discharge status: Home / Self Care | Payer: Medicare HMO | Attending: Internal Medicine | Admitting: Internal Medicine

## 2016-11-16 DIAGNOSIS — Z951 Presence of aortocoronary bypass graft: Secondary | ICD-10-CM

## 2016-11-16 DIAGNOSIS — R7989 Other specified abnormal findings of blood chemistry: Secondary | ICD-10-CM

## 2016-11-16 DIAGNOSIS — I25110 Atherosclerotic heart disease of native coronary artery with unstable angina pectoris: Secondary | ICD-10-CM | POA: Diagnosis not present

## 2016-11-16 DIAGNOSIS — Z833 Family history of diabetes mellitus: Secondary | ICD-10-CM | POA: Diagnosis not present

## 2016-11-16 DIAGNOSIS — Z8674 Personal history of sudden cardiac arrest: Secondary | ICD-10-CM | POA: Diagnosis not present

## 2016-11-16 DIAGNOSIS — Z8673 Personal history of transient ischemic attack (TIA), and cerebral infarction without residual deficits: Secondary | ICD-10-CM

## 2016-11-16 DIAGNOSIS — E1151 Type 2 diabetes mellitus with diabetic peripheral angiopathy without gangrene: Secondary | ICD-10-CM | POA: Diagnosis not present

## 2016-11-16 DIAGNOSIS — D696 Thrombocytopenia, unspecified: Secondary | ICD-10-CM | POA: Diagnosis not present

## 2016-11-16 DIAGNOSIS — I452 Bifascicular block: Secondary | ICD-10-CM | POA: Diagnosis not present

## 2016-11-16 DIAGNOSIS — F1020 Alcohol dependence, uncomplicated: Secondary | ICD-10-CM | POA: Diagnosis present

## 2016-11-16 DIAGNOSIS — I214 Non-ST elevation (NSTEMI) myocardial infarction: Secondary | ICD-10-CM | POA: Diagnosis not present

## 2016-11-16 DIAGNOSIS — I340 Nonrheumatic mitral (valve) insufficiency: Secondary | ICD-10-CM | POA: Diagnosis present

## 2016-11-16 DIAGNOSIS — Z9841 Cataract extraction status, right eye: Secondary | ICD-10-CM

## 2016-11-16 DIAGNOSIS — R079 Chest pain, unspecified: Secondary | ICD-10-CM | POA: Diagnosis not present

## 2016-11-16 DIAGNOSIS — I2510 Atherosclerotic heart disease of native coronary artery without angina pectoris: Secondary | ICD-10-CM | POA: Diagnosis not present

## 2016-11-16 DIAGNOSIS — I10 Essential (primary) hypertension: Secondary | ICD-10-CM | POA: Diagnosis present

## 2016-11-16 DIAGNOSIS — Z961 Presence of intraocular lens: Secondary | ICD-10-CM | POA: Diagnosis present

## 2016-11-16 DIAGNOSIS — I5181 Takotsubo syndrome: Secondary | ICD-10-CM | POA: Diagnosis not present

## 2016-11-16 DIAGNOSIS — Z8249 Family history of ischemic heart disease and other diseases of the circulatory system: Secondary | ICD-10-CM

## 2016-11-16 DIAGNOSIS — Z9842 Cataract extraction status, left eye: Secondary | ICD-10-CM

## 2016-11-16 DIAGNOSIS — R778 Other specified abnormalities of plasma proteins: Secondary | ICD-10-CM

## 2016-11-16 DIAGNOSIS — Z87891 Personal history of nicotine dependence: Secondary | ICD-10-CM

## 2016-11-16 DIAGNOSIS — D649 Anemia, unspecified: Secondary | ICD-10-CM | POA: Diagnosis not present

## 2016-11-16 DIAGNOSIS — F444 Conversion disorder with motor symptom or deficit: Secondary | ICD-10-CM | POA: Diagnosis present

## 2016-11-16 DIAGNOSIS — I480 Paroxysmal atrial fibrillation: Secondary | ICD-10-CM | POA: Diagnosis not present

## 2016-11-16 DIAGNOSIS — N400 Enlarged prostate without lower urinary tract symptoms: Secondary | ICD-10-CM | POA: Diagnosis present

## 2016-11-16 DIAGNOSIS — Z7984 Long term (current) use of oral hypoglycemic drugs: Secondary | ICD-10-CM | POA: Diagnosis not present

## 2016-11-16 DIAGNOSIS — I255 Ischemic cardiomyopathy: Secondary | ICD-10-CM | POA: Diagnosis present

## 2016-11-16 DIAGNOSIS — Z7982 Long term (current) use of aspirin: Secondary | ICD-10-CM

## 2016-11-16 DIAGNOSIS — F10220 Alcohol dependence with intoxication, uncomplicated: Secondary | ICD-10-CM | POA: Diagnosis not present

## 2016-11-16 DIAGNOSIS — R29898 Other symptoms and signs involving the musculoskeletal system: Secondary | ICD-10-CM | POA: Diagnosis present

## 2016-11-16 DIAGNOSIS — E782 Mixed hyperlipidemia: Secondary | ICD-10-CM | POA: Diagnosis not present

## 2016-11-16 DIAGNOSIS — E785 Hyperlipidemia, unspecified: Secondary | ICD-10-CM | POA: Diagnosis not present

## 2016-11-16 DIAGNOSIS — I25700 Atherosclerosis of coronary artery bypass graft(s), unspecified, with unstable angina pectoris: Secondary | ICD-10-CM | POA: Diagnosis present

## 2016-11-16 DIAGNOSIS — I5032 Chronic diastolic (congestive) heart failure: Secondary | ICD-10-CM | POA: Diagnosis not present

## 2016-11-16 DIAGNOSIS — F1010 Alcohol abuse, uncomplicated: Secondary | ICD-10-CM | POA: Diagnosis present

## 2016-11-16 HISTORY — DX: Personal history of pneumonia (recurrent): Z87.01

## 2016-11-16 LAB — BASIC METABOLIC PANEL
ANION GAP: 9 (ref 5–15)
BUN: 19 mg/dL (ref 6–20)
CHLORIDE: 106 mmol/L (ref 101–111)
CO2: 23 mmol/L (ref 22–32)
Calcium: 9.2 mg/dL (ref 8.9–10.3)
Creatinine, Ser: 1.02 mg/dL (ref 0.61–1.24)
GFR calc non Af Amer: >60 mL/min (ref >60)
Glucose, Bld: 101 mg/dL — ABNORMAL HIGH (ref 65–99)
Potassium: 4 mmol/L (ref 3.5–5.1)
Sodium: 138 mmol/L (ref 135–145)

## 2016-11-16 LAB — CBC
HCT: 32 % — ABNORMAL LOW (ref 39.0–52.0)
HEMOGLOBIN: 10.8 g/dL — AB (ref 13.0–17.0)
MCH: 31.4 pg (ref 26.0–34.0)
MCHC: 33.8 g/dL (ref 30.0–36.0)
MCV: 93 fL (ref 78.0–100.0)
Platelets: 146 10*3/uL — ABNORMAL LOW (ref 150–400)
RBC: 3.44 MIL/uL — AB (ref 4.22–5.81)
RDW: 13.6 % (ref 11.5–15.5)
WBC: 6.5 10*3/uL (ref 4.0–10.5)

## 2016-11-16 LAB — HEPATIC FUNCTION PANEL
ALT: 10 U/L — AB (ref 17–63)
AST: 26 U/L (ref 15–41)
Albumin: 3.4 g/dL — ABNORMAL LOW (ref 3.5–5.0)
Alkaline Phosphatase: 75 U/L (ref 38–126)
BILIRUBIN INDIRECT: 0.6 mg/dL (ref 0.3–0.9)
Bilirubin, Direct: 0.2 mg/dL (ref 0.1–0.5)
TOTAL PROTEIN: 7.4 g/dL (ref 6.5–8.1)
Total Bilirubin: 0.8 mg/dL (ref 0.3–1.2)

## 2016-11-16 LAB — LIPASE, BLOOD: LIPASE: 34 U/L (ref 11–51)

## 2016-11-16 LAB — RAPID URINE DRUG SCREEN, HOSP PERFORMED
AMPHETAMINES: NOT DETECTED
BARBITURATES: NOT DETECTED
BENZODIAZEPINES: NOT DETECTED
Cocaine: NOT DETECTED
OPIATES: NOT DETECTED
TETRAHYDROCANNABINOL: NOT DETECTED

## 2016-11-16 LAB — I-STAT TROPONIN, ED: Troponin i, poc: 1.17 ng/mL (ref 0.00–0.08)

## 2016-11-16 LAB — GLUCOSE, CAPILLARY
GLUCOSE-CAPILLARY: 80 mg/dL (ref 65–99)
GLUCOSE-CAPILLARY: 109 mg/dL — AB (ref 65–99)
GLUCOSE-CAPILLARY: 112 mg/dL — AB (ref 65–99)
Glucose-Capillary: 67 mg/dL (ref 65–99)

## 2016-11-16 LAB — TROPONIN I
TROPONIN I: 1.36 ng/mL — AB (ref <0.03)
TROPONIN I: 1.28 ng/mL — AB (ref <0.03)
Troponin I: 1.33 ng/mL (ref <0.03)

## 2016-11-16 LAB — HEPARIN LEVEL (UNFRACTIONATED): Heparin Unfractionated: 0.82 IU/mL — ABNORMAL HIGH (ref 0.30–0.70)

## 2016-11-16 MED ORDER — ASPIRIN 81 MG PO CHEW: 324.0000 mg | CHEWABLE_TABLET | Freq: Once | ORAL | Status: DC

## 2016-11-16 MED ORDER — METOPROLOL TARTRATE 25 MG PO TABS: 25.0000 mg | ORAL_TABLET | Freq: Two times a day (BID) | ORAL | Status: DC

## 2016-11-16 MED ORDER — HEPARIN (PORCINE) IN NACL 100-0.45 UNIT/ML-% IJ SOLN
700.0000 [IU]/h | INTRAMUSCULAR | Status: DC
  Administered 2016-11-16: 1000 [IU]/h via INTRAVENOUS
  Filled 2016-11-16: qty 250

## 2016-11-16 MED ORDER — HYDRALAZINE HCL 20 MG/ML IJ SOLN: 10.0000 mg | Freq: Three times a day (TID) | INTRAMUSCULAR | Status: DC | PRN

## 2016-11-16 MED ORDER — TAMSULOSIN HCL 0.4 MG PO CAPS
0.4000 mg | ORAL_CAPSULE | Freq: Every day | ORAL | Status: DC
  Administered 2016-11-16 – 2016-11-18 (×3): 0.4 mg via ORAL
  Filled 2016-11-16 (×3): qty 1

## 2016-11-16 MED ORDER — VITAMIN B-1 100 MG PO TABS
100.0000 mg | ORAL_TABLET | Freq: Every day | ORAL | Status: DC
  Administered 2016-11-16 – 2016-11-18 (×3): 100 mg via ORAL
  Filled 2016-11-16 (×3): qty 1

## 2016-11-16 MED ORDER — ATORVASTATIN CALCIUM 20 MG PO TABS
20.0000 mg | ORAL_TABLET | Freq: Every day | ORAL | Status: DC
  Administered 2016-11-16: 20 mg via ORAL
  Filled 2016-11-16: qty 2

## 2016-11-16 MED ORDER — METOPROLOL TARTRATE 50 MG PO TABS
50.0000 mg | ORAL_TABLET | Freq: Two times a day (BID) | ORAL | Status: DC
  Administered 2016-11-16 – 2016-11-18 (×3): 50 mg via ORAL
  Filled 2016-11-16: qty 1
  Filled 2016-11-16: qty 2
  Filled 2016-11-16 (×2): qty 1

## 2016-11-16 MED ORDER — LORAZEPAM 1 MG PO TABS: 1.0000 mg | ORAL_TABLET | Freq: Four times a day (QID) | ORAL | Status: DC | PRN

## 2016-11-16 MED ORDER — THIAMINE HCL 100 MG/ML IJ SOLN: 100.0000 mg | Freq: Every day | INTRAMUSCULAR | Status: DC

## 2016-11-16 MED ORDER — NITROGLYCERIN 0.4 MG SL SUBL: 0.4000 mg | SUBLINGUAL_TABLET | SUBLINGUAL | Status: DC | PRN

## 2016-11-16 MED ORDER — ASPIRIN EC 325 MG PO TBEC
325.0000 mg | DELAYED_RELEASE_TABLET | Freq: Every day | ORAL | Status: DC
  Administered 2016-11-17: 325 mg via ORAL
  Filled 2016-11-16: qty 1

## 2016-11-16 MED ORDER — MORPHINE SULFATE (PF) 4 MG/ML IV SOLN: 2.0000 mg | INTRAVENOUS | Status: DC | PRN

## 2016-11-16 MED ORDER — FOLIC ACID 1 MG PO TABS
1.0000 mg | ORAL_TABLET | Freq: Every day | ORAL | Status: DC
  Administered 2016-11-16 – 2016-11-18 (×3): 1 mg via ORAL
  Filled 2016-11-16 (×3): qty 1

## 2016-11-16 MED ORDER — ALUM & MAG HYDROXIDE-SIMETH 200-200-20 MG/5ML PO SUSP: 15.0000 mL | Freq: Once | ORAL | Status: DC

## 2016-11-16 MED ORDER — ACETAMINOPHEN 325 MG PO TABS
650.0000 mg | ORAL_TABLET | ORAL | Status: DC | PRN
  Administered 2016-11-16 – 2016-11-17 (×2): 650 mg via ORAL
  Filled 2016-11-16 (×2): qty 2

## 2016-11-16 MED ORDER — HEPARIN BOLUS VIA INFUSION
4000.0000 [IU] | Freq: Once | INTRAVENOUS | Status: AC
  Administered 2016-11-16: 4000 [IU] via INTRAVENOUS
  Filled 2016-11-16: qty 4000

## 2016-11-16 MED ORDER — ZOLPIDEM TARTRATE 5 MG PO TABS: 5.0000 mg | ORAL_TABLET | Freq: Every evening | ORAL | Status: DC | PRN

## 2016-11-16 MED ORDER — ONDANSETRON HCL 4 MG/2ML IJ SOLN: 4.0000 mg | Freq: Four times a day (QID) | INTRAMUSCULAR | Status: DC | PRN

## 2016-11-16 MED ORDER — LORAZEPAM 2 MG/ML IJ SOLN: 1.0000 mg | Freq: Four times a day (QID) | INTRAMUSCULAR | Status: DC | PRN

## 2016-11-16 MED ORDER — INSULIN ASPART 100 UNIT/ML ~~LOC~~ SOLN: 0.0000 [IU] | Freq: Three times a day (TID) | SUBCUTANEOUS | Status: DC

## 2016-11-16 MED ORDER — CHLORDIAZEPOXIDE HCL 25 MG PO CAPS
100.0000 mg | ORAL_CAPSULE | Freq: Once | ORAL | Status: AC
  Administered 2016-11-16: 100 mg via ORAL
  Filled 2016-11-16: qty 4

## 2016-11-16 MED ORDER — ADULT MULTIVITAMIN W/MINERALS CH
1.0000 | ORAL_TABLET | Freq: Every day | ORAL | Status: DC
  Administered 2016-11-16 – 2016-11-18 (×2): 1 via ORAL
  Filled 2016-11-16 (×3): qty 1

## 2016-11-16 NOTE — ED Triage Notes (Signed)
Pt. Here from home via [facility] for chest pain intermittent x5 days. Pt. Said pain got better, so he never called EMS. Today he was given 324 ASA and 1 nitro with relief of pain. Pt. States pain was central and describes as pressure. Pt. Home infested with bed bugs. Pt. Aox4.

## 2016-11-16 NOTE — ED Notes (Signed)
Attempted report. Admitting at bedside.

## 2016-11-16 NOTE — Progress Notes (Signed)
ANTICOAGULATION CONSULT NOTE  Pharmacy Consult for heparin Indication: chest pain/ACS  No Known Allergies  Patient Measurements: Height: 5\' 11"  (180.3 cm) Weight: 167 lb (75.8 kg) IBW/kg (Calculated) : 75.3 Heparin Dosing Weight: 75.3kg  Vital Signs: Temp: 97.7 F (36.5 C) (07/29 2127) Temp Source: Oral (07/29 1028) BP: 126/35 (07/29 2127) Pulse Rate: 70 (07/29 2127)  Labs:  Recent Labs  11/16/16 1031 11/16/16 1415 11/16/16 1823 11/16/16 2103  HGB 10.8*  --   --   --   HCT 32.0*  --   --   --   PLT 146*  --   --   --   HEPARINUNFRC  --   --   --  0.82*  CREATININE 1.02  --   --   --   TROPONINI  --  1.36* 1.33*  --     Estimated Creatinine Clearance: 61.5 mL/min (by C-G formula based on SCr of 1.02 mg/dL).    Assessment: 82 YOM brought in by EMS with intermittent chest pain for 5 days. Troponin elevated at 1.17. Patient is not on anticoagulation PTA. He is on heparin and the initial level is 0.82     Goal of Therapy:  Heparin level 0.3-0.7 units/ml Monitor platelets by anticoagulation protocol: Yes   Plan:  -decrease heparin to 850 units/hr -Heparin level in 8 hours and daily wth CBC daily  Harland Germanndrew Zeinab Rodwell, Pharm D 11/16/2016 10:05 PM

## 2016-11-16 NOTE — Progress Notes (Signed)
ANTICOAGULATION CONSULT NOTE - Initial Consult  Pharmacy Consult for heparin Indication: chest pain/ACS  No Known Allergies  Patient Measurements: Height: 5\' 11"  (180.3 cm) Weight: 167 lb (75.8 kg) IBW/kg (Calculated) : 75.3 Heparin Dosing Weight: 75.3kg  Vital Signs: Temp: 97.9 F (36.6 C) (07/29 1028) Temp Source: Oral (07/29 1028) Pulse Rate: 100 (07/29 1028)  Labs: No results for input(s): HGB, HCT, PLT, APTT, LABPROT, INR, HEPARINUNFRC, HEPRLOWMOCWT, CREATININE, CKTOTAL, CKMB, TROPONINI in the last 72 hours.  CrCl cannot be calculated (Patient's most recent lab result is older than the maximum 21 days allowed.).    Assessment: 82 YOM brought in by EMS with intermittent chest pain for 5 days. Troponin elevated at 1.17. Patient is not on anticoagulation PTA. CBC has been collected but not yet resulted.   Goal of Therapy:  Heparin level 0.3-0.7 units/ml Monitor platelets by anticoagulation protocol: Yes   Plan:  -heparin bolus with 4000 units IV x1, then start infusion at 1000 units/hr -heparin level in 8h -daily heparin level and CBC -follow cardiology plans  Calin Ellery D. Vidhi Delellis, PharmD, BCPS Clinical Pharmacist Pager: [phone] Clinical Phone for 11/16/2016 until 3:30pm: [phone] If after 3:30pm, please call main pharmacy at [phone] 11/16/2016 11:26 AM

## 2016-11-16 NOTE — ED Provider Notes (Signed)
[facility]-EMERGENCY DEPT Provider Note   CSN: [id number] Arrival date & time: 11/16/16  1026     History   Chief Complaint Chief Complaint  Patient presents with  . Chest Pain    HPI Johnny [name] is a 82 y.o. male.  82 yo M with a chief complaint of chest pain. He is unable to describe this. Just feels like a pain. Localized to the substernal area. Going on for the past week. It is exertional in nature. Associated with shortness of breath. Denies diaphoresis, denies nausea.   The history is provided by the patient.  Chest Pain   This is a new problem. The current episode started more than 2 days ago. The problem occurs constantly. The problem has been gradually worsening. The pain is associated with exertion. The pain is present in the epigastric region. The pain is at a severity of 7/10. The pain is moderate. The quality of the pain is described as exertional and heavy. The pain does not radiate. Duration of episode(s) is 2 hours. Associated symptoms include shortness of breath and weakness. Pertinent negatives include no abdominal pain, no fever, no headaches, no palpitations and no vomiting. He has tried rest for the symptoms. The treatment provided significant relief.  His past medical history is significant for CAD.    Past Medical History:  Diagnosis Date  . Acute alcoholic hepatitis 05/20/2010  . Altered mental status 04/01/2012  . Anemia of other chronic disease 08/16/2007  . Arthritis   . CAD (coronary artery disease)    a. s/p CABG, notes unclear - 1998 or 2003?  Marland Kitchen. Chronic low back pain   . Coagulopathy (HCC)   . Diabetes mellitus (HCC)   . Essential hypertension   . ETOH abuse   . Hallucination 04/01/2012  . History of pneumonia   . History of stroke   . Hyperlipidemia   . Paroxysmal atrial fibrillation (HCC)   . Paroxysmal atrial flutter (HCC)    a. s/p ablation 2007.  Marland Kitchen. PEA (Pulseless electrical activity) (HCC)    a. h/o PEA cardiac arrest 04/2012 (felt r/t PNA,  acidosis, hypoxic resp failure in setting of alcohol withdrawal; required tracheostomy)  . Protein calorie malnutrition (HCC)   . Seizures (HCC) 04/01/2012    Patient Active Problem List   Diagnosis Date Noted  . CAP (community acquired pneumonia) 09/22/2015  . Pressure ulcer 09/22/2015  . Alcoholic cardiomyopathy (HCC) 78/29/562101/02/2016  . Debility   . COPD (chronic obstructive pulmonary disease), presumed 04/26/2012  . Protein calorie malnutrition (HCC) 04/26/2012  . Poor dentition 04/26/2012  . Atrial flutter (HCC) 04/13/2012  . Protein-calorie malnutrition, severe (HCC) 04/11/2012  . s/p PEA Cardiac arrest: Likely related to pneumonia, acidosis, hypoxic respiratory failure in setting of ETOH withdrawal 04/02/2012  . Anoxic Encephalopathy: due to hypoxia after cardiac arrest.  04/02/2012  . HCAP (healthcare-associated pneumonia) -->resolved 04/01/2012  . Alcohol dependence s/p withdrawl  04/01/2012  . acute renal failure (resolved): Baseline creatinine 0.95 from 01/05/12.  04/01/2012  . SIRS (systemic inflammatory response syndrome) (HCC) 12/30/2011  . Hypomagnesemia 12/30/2011  . Weakness generalized 12/29/2011  . Hypertensive urgency 12/29/2011  . Tachycardia 04/28/2011  . Lower extremity weakness 04/28/2011  . Anemia of chronic disease 08/16/2007  . Paroxysmal atrial fibrillation (HCC) 08/12/2007  . Diabetes mellitus with peripheral vascular disease (HCC) 12/11/2006  . Essential hypertension 12/11/2006  . Coronary atherosclerosis 12/11/2006    Past Surgical History:  Procedure Laterality Date  . CATARACT EXTRACTION W/ INTRAOCULAR LENS  IMPLANT, BILATERAL     "  years ago" (04/01/2012)  . CORONARY ARTERY BYPASS GRAFT  ~ 2003   CABG X4  . ESOPHAGOGASTRODUODENOSCOPY  04/13/2012   Procedure: ESOPHAGOGASTRODUODENOSCOPY (EGD);  Surgeon: Florencia Reasons, MD;  Location: [facility] ENDOSCOPY;  Service: Endoscopy;  Laterality: N/A;  push peg  . PEG PLACEMENT  04/13/2012   Procedure:  PERCUTANEOUS ENDOSCOPIC GASTROSTOMY (PEG) PLACEMENT;  Surgeon: Florencia Reasons, MD;  Location: [facility] ENDOSCOPY;  Service: Endoscopy;  Laterality: N/A;       Home Medications    Prior to Admission medications   Medication Sig Start Date End Date Taking? Authorizing Provider  ACCU-CHEK AVIVA PLUS test strip USE TO TEST BLOOD SUGAR ONCE A DAY 06/25/15   Gordy Savers, MD  ACCU-CHEK SOFTCLIX LANCETS lancets USE 1 EACH BY OTHER ROUTE DAILY AS NEEDED FOR OTHER. 11/28/15   Gordy Savers, MD  acetaminophen (TYLENOL) 325 MG tablet Take 2 tablets (650 mg total) by mouth every 6 (six) hours as needed for mild pain (or Fever >/= 101). 05/08/15   Alison Murray, MD  aspirin 81 MG tablet Take 81 mg by mouth daily.    [provider]  atorvastatin (LIPITOR) 20 MG tablet TAKE 1 TABLET EVERY DAY 09/10/16   Gordy Savers, MD  Blood Glucose Calibration (ACCU-CHEK AVIVA) SOLN  06/09/16   [provider]  Lancet Devices (ADJUSTABLE LANCING DEVICE) MISC  03/24/16   [provider]  metFORMIN (GLUCOPHAGE) 500 MG tablet TAKE 1 TABLET (500 MG TOTAL) BY MOUTH 2 (TWO) TIMES DAILY WITH A MEAL. 07/31/16   Gordy Savers, MD  metoprolol (LOPRESSOR) 50 MG tablet TAKE 1.5 TABLETS (75 MG TOTAL) BY MOUTH 2 (TWO) TIMES DAILY. 07/31/16   Gordy Savers, MD  Multiple Vitamin (MULTIVITAMIN) tablet Take 1 tablet by mouth daily. Reported on 11/08/2015    [provider]  tamsulosin (FLOMAX) 0.4 MG CAPS capsule TAKE 1 CAPSULE (0.4 MG TOTAL) BY MOUTH DAILY. 10/13/16   Gordy Savers, MD    Family History Family History  Problem Relation Age of Onset  . Heart failure Mother   . Aneurysm Sister   . Suicidality Brother   . Heart failure Brother   . Diabetes Sister     Social History Social History  Substance Use Topics  . Smoking status: Former Smoker    Packs/day: 0.50    Years: 17.00    Types: Cigarettes    Quit date: 04/22/1983  . Smokeless tobacco: Never  Used     Comment: 04/01/2012 "quit smoking 20+ yr ago"  . Alcohol use 10.8 oz/week    2 Glasses of wine, 16 Shots of liquor per week     Comment: hx of ETOH abuse; 04/01/2012 "drink ~ 1 pint//wk; vodka; cup of wine/wk" 04/2015 40 oz wine per day     Allergies   Patient has no known allergies.   Review of Systems Review of Systems  Constitutional: Negative for chills and fever.  HENT: Negative for congestion and facial swelling.   Eyes: Negative for discharge and visual disturbance.  Respiratory: Positive for shortness of breath.   Cardiovascular: Positive for chest pain. Negative for palpitations.  Gastrointestinal: Negative for abdominal pain, diarrhea and vomiting.  Musculoskeletal: Negative for arthralgias and myalgias.  Skin: Negative for color change and rash.  Neurological: Positive for weakness. Negative for tremors, syncope and headaches.  Psychiatric/Behavioral: Negative for confusion and dysphoric mood.     Physical Exam Updated Vital Signs BP (!) 152/95   Pulse 64  Temp 97.9 F (36.6 C) (Oral)   Resp 13   Ht 5\' 11"  (1.803 m)   Wt 75.8 kg (167 lb)   SpO2 100%   BMI 23.29 kg/m   Physical Exam  Constitutional: He is oriented to person, place, and time. He appears well-developed and well-nourished.  HENT:  Head: Normocephalic and atraumatic.  Eyes: Pupils are equal, round, and reactive to light. EOM are normal.  Neck: Normal range of motion. Neck supple. No JVD present.  Cardiovascular: Normal rate and regular rhythm.  Exam reveals no gallop and no friction rub.   No murmur heard. Pulmonary/Chest: No respiratory distress. He has no wheezes.  Abdominal: He exhibits no distension and no mass. There is tenderness (mild epigastric). There is no rebound and no guarding.  Musculoskeletal: Normal range of motion.  Neurological: He is alert and oriented to person, place, and time.  Skin: No rash noted. No pallor.  Psychiatric: He has a normal mood and affect. His  behavior is normal.  Nursing note and vitals reviewed.    ED Treatments / Results  Labs (all labs ordered are listed, but only abnormal results are displayed) Labs Reviewed  BASIC METABOLIC PANEL - Abnormal; Notable for the following:       Result Value   Glucose, Bld 101 (*)    All other components within normal limits  CBC - Abnormal; Notable for the following:    RBC 3.44 (*)    Hemoglobin 10.8 (*)    HCT 32.0 (*)    Platelets 146 (*)    All other components within normal limits  HEPATIC FUNCTION PANEL - Abnormal; Notable for the following:    Albumin 3.4 (*)    ALT 10 (*)    All other components within normal limits  I-STAT TROPONIN, ED - Abnormal; Notable for the following:    Troponin i, poc 1.17 (*)    All other components within normal limits  LIPASE, BLOOD  HEPARIN LEVEL (UNFRACTIONATED)  RAPID URINE DRUG SCREEN, HOSP PERFORMED  TROPONIN I  TROPONIN I  TROPONIN I    EKG  EKG Interpretation  Date/Time:  Sunday November 16 2016 10:27:18 EDT Ventricular Rate:  99 PR Interval:    QRS Duration: 147 QT Interval:  392 QTC Calculation: 504 R Axis:   -61 Text Interpretation:  Sinus rhythm LAE, consider biatrial enlargement RBBB and LAFB No significant change since last tracing Confirmed by Melene Plan [phone]) on 11/16/2016 10:35:52 AM       Radiology Dg Chest Portable 1 View  Result Date: 11/16/2016 CLINICAL DATA:  Chest pain. EXAM: PORTABLE CHEST 1 VIEW COMPARISON:  09/25/2015 FINDINGS: Previous median sternotomy and CABG procedure. The heart size is normal. Aortic atherosclerosis. No pleural effusion or edema. IMPRESSION: 1. No acute cardiopulmonary abnormalities 2.  Aortic Atherosclerosis (ICD10-I70.0). Electronically Signed   By: Signa Kell M.D.   On: 11/16/2016 10:56    Procedures Procedures (including critical care time)  Medications Ordered in ED Medications  alum & mag hydroxide-simeth (MAALOX/MYLANTA) 200-200-20 MG/5ML suspension 15 mL (15 mLs Oral  Refused 11/16/16 1105)  aspirin chewable tablet 324 mg (324 mg Oral Not Given 11/16/16 1121)  heparin ADULT infusion 100 units/mL (25000 units/276mL sodium chloride 0.45%) (1,000 Units/hr Intravenous New Bag/Given 11/16/16 1200)  chlordiazePOXIDE (LIBRIUM) capsule 100 mg (not administered)  acetaminophen (TYLENOL) tablet 650 mg (not administered)  ondansetron (ZOFRAN) injection 4 mg (not administered)  morphine 4 MG/ML injection 2 mg (not administered)  aspirin EC tablet 325 mg (not administered)  zolpidem (AMBIEN) tablet 5 mg (not administered)  hydrALAZINE (APRESOLINE) injection 10 mg (not administered)  heparin bolus via infusion 4,000 Units (4,000 Units Intravenous Bolus from Bag 11/16/16 1200)     Initial Impression / Assessment and Plan / ED Course  I have reviewed the triage vital signs and the nursing notes.  Pertinent labs & imaging results that were available during my care of the patient were reviewed by me and considered in my medical decision making (see chart for details).     82 yo M With a chief complaint of shortness breath on exertion. Going on for the past 5 days. Unsure feels like his old MI. Initial troponin is 1.17. Given aspirin. Start on heparin drip. Will discuss with cardiology. Discussed the case with Dr. Diona BrownerMcDowell, cardiology. After reviewing the patient's history he is concerned of his significant history of alcoholism including a 10 day stay in the hospital for withdrawals. He recommended a hospitalist admission and cardiology consult.  CRITICAL CARE Performed by: Rae Roamaniel Patrick Benay Pomeroy   Total critical care time: 35 minutes  Critical care time was exclusive of separately billable procedures and treating other patients.  Critical care was necessary to treat or prevent imminent or life-threatening deterioration.  Critical care was time spent personally by me on the following activities: development of treatment plan with patient and/or surrogate as well as  nursing, discussions with consultants, evaluation of patient's response to treatment, examination of patient, obtaining history from patient or surrogate, ordering and performing treatments and interventions, ordering and review of laboratory studies, ordering and review of radiographic studies, pulse oximetry and re-evaluation of patient's condition.  The patients results and plan were reviewed and discussed.   Any x-rays performed were independently reviewed by myself.   Differential diagnosis were considered with the presenting HPI.  Medications  alum & mag hydroxide-simeth (MAALOX/MYLANTA) 200-200-20 MG/5ML suspension 15 mL (15 mLs Oral Refused 11/16/16 1105)  aspirin chewable tablet 324 mg (324 mg Oral Not Given 11/16/16 1121)  heparin ADULT infusion 100 units/mL (25000 units/26450mL sodium chloride 0.45%) (1,000 Units/hr Intravenous New Bag/Given 11/16/16 1200)  chlordiazePOXIDE (LIBRIUM) capsule 100 mg (not administered)  acetaminophen (TYLENOL) tablet 650 mg (not administered)  ondansetron (ZOFRAN) injection 4 mg (not administered)  morphine 4 MG/ML injection 2 mg (not administered)  aspirin EC tablet 325 mg (not administered)  zolpidem (AMBIEN) tablet 5 mg (not administered)  hydrALAZINE (APRESOLINE) injection 10 mg (not administered)  heparin bolus via infusion 4,000 Units (4,000 Units Intravenous Bolus from Bag 11/16/16 1200)    Vitals:   11/16/16 1028 11/16/16 1030 11/16/16 1115 11/16/16 1200  BP:  (!) 162/63 (!) 164/68 (!) 152/95  Pulse: 100 93 71 64  Resp: 18 15 18 13   Temp: 97.9 F (36.6 C)     TempSrc: Oral     SpO2: 100% 100% 97% 100%  Weight: 75.8 kg (167 lb)     Height: 5\' 11"  (1.803 m)       Final diagnoses:  NSTEMI (non-ST elevated myocardial infarction) [facility](HCC)    Admission/ observation were discussed with the admitting physician, patient and/or family and they are comfortable with the plan.   Final Clinical Impressions(s) / ED Diagnoses   Final diagnoses:    NSTEMI (non-ST elevated myocardial infarction) [facility](HCC)    New Prescriptions New Prescriptions   No medications on file     Melene PlanFloyd, Barnie, DO 11/16/16 1245

## 2016-11-16 NOTE — ED Notes (Signed)
Cardiology at bedside.

## 2016-11-16 NOTE — H&P (Signed)
[facility] History and Physical  MARTEZ WEIAND ZOX:[id number] DOB: 1934/07/06 DOA: 11/16/2016  Referring physician:  PCP: Gordy Savers, MD   Chief Complaint: cp  HPI: Johnny [name] is a 82 y.o. male  past medical history of CABG, hypertension, call abuse, stroke, atrial fibrillation, flutter and PEA arrests presents with chest pain. Patient states he had acute onset of chest discomfort that gradually improved. EMS was activated. Nitroglycerin glycerin given which resolved the pain. Patient brought to emergency room. Denies chest trauma. Denies past heart attack. No family history of heart attack.  ED course: EKG did not show acute ST changes. Troponin was positive. Cardiology consult by EDP. Hospital was consulted for admission.   Review of Systems:  As per HPI otherwise 10 point review of systems negative.    Past Medical History:  Diagnosis Date  . Acute alcoholic hepatitis 05/20/2010  . Altered mental status 04/01/2012  . Anemia of other chronic disease 08/16/2007  . Arthritis   . CAD (coronary artery disease)    a. s/p CABG, notes unclear - 1998 or 2003?  Marland Kitchen Chronic low back pain   . Coagulopathy (HCC)   . Diabetes mellitus (HCC)   . Essential hypertension   . ETOH abuse   . Hallucination 04/01/2012  . History of pneumonia   . History of stroke   . Hyperlipidemia   . Paroxysmal atrial fibrillation (HCC)   . Paroxysmal atrial flutter (HCC)    a. s/p ablation 2007.  Marland Kitchen PEA (Pulseless electrical activity) (HCC)    a. h/o PEA cardiac arrest 04/2012 (felt r/t PNA, acidosis, hypoxic resp failure in setting of alcohol withdrawal; required tracheostomy)  . Protein calorie malnutrition (HCC)   . Seizures (HCC) 04/01/2012   Past Surgical History:  Procedure Laterality Date  . CATARACT EXTRACTION W/ INTRAOCULAR LENS  IMPLANT, BILATERAL     "years ago" (04/01/2012)  . CORONARY ARTERY BYPASS GRAFT  ~ 2003   CABG X4  . ESOPHAGOGASTRODUODENOSCOPY  04/13/2012   Procedure:  ESOPHAGOGASTRODUODENOSCOPY (EGD);  Surgeon: Florencia Reasons, MD;  Location: [facility] ENDOSCOPY;  Service: Endoscopy;  Laterality: N/A;  push peg  . PEG PLACEMENT  04/13/2012   Procedure: PERCUTANEOUS ENDOSCOPIC GASTROSTOMY (PEG) PLACEMENT;  Surgeon: Florencia Reasons, MD;  Location: [facility] ENDOSCOPY;  Service: Endoscopy;  Laterality: N/A;   Social History:  reports that he quit smoking about 33 years ago. His smoking use included Cigarettes. He has a 8.50 pack-year smoking history. He has never used smokeless tobacco. He reports that he drinks about 10.8 oz of alcohol per week . He reports that he does not use drugs.  No Known Allergies  Family History  Problem Relation Age of Onset  . Heart failure Mother   . Aneurysm Sister   . Suicidality Brother   . Heart failure Brother   . Diabetes Sister      Prior to Admission medications   Medication Sig Start Date End Date Taking? Authorizing Provider  ACCU-CHEK AVIVA PLUS test strip USE TO TEST BLOOD SUGAR ONCE A DAY 06/25/15   Gordy Savers, MD  ACCU-CHEK SOFTCLIX LANCETS lancets USE 1 EACH BY OTHER ROUTE DAILY AS NEEDED FOR OTHER. 11/28/15   Gordy Savers, MD  acetaminophen (TYLENOL) 325 MG tablet Take 2 tablets (650 mg total) by mouth every 6 (six) hours as needed for mild pain (or Fever >/= 101). 05/08/15   Alison Murray, MD  aspirin 81 MG tablet Take 81 mg by mouth daily.  [provider]  atorvastatin (LIPITOR) 20 MG tablet TAKE 1 TABLET EVERY DAY 09/10/16   Gordy SaversKwiatkowski, Peter F, MD  Blood Glucose Calibration (ACCU-CHEK AVIVA) SOLN  06/09/16   [provider]  Lancet Devices (ADJUSTABLE LANCING DEVICE) MISC  03/24/16   [provider]  metFORMIN (GLUCOPHAGE) 500 MG tablet TAKE 1 TABLET (500 MG TOTAL) BY MOUTH 2 (TWO) TIMES DAILY WITH A MEAL. 07/31/16   Gordy SaversKwiatkowski, Peter F, MD  metoprolol (LOPRESSOR) 50 MG tablet TAKE 1.5 TABLETS (75 MG TOTAL) BY MOUTH 2 (TWO) TIMES DAILY. 07/31/16   Gordy SaversKwiatkowski, Peter F, MD    Multiple Vitamin (MULTIVITAMIN) tablet Take 1 tablet by mouth daily. Reported on 11/08/2015    [provider]  tamsulosin (FLOMAX) 0.4 MG CAPS capsule TAKE 1 CAPSULE (0.4 MG TOTAL) BY MOUTH DAILY. 10/13/16   Gordy SaversKwiatkowski, Peter F, MD   Physical Exam: Vitals:   11/16/16 1200 11/16/16 1315 11/16/16 1330 11/16/16 1422  BP: (!) 152/95 (!) 155/99 (!) 145/86 (!) 145/51  Pulse: 64 76 68 89  Resp: 13 15 12 17   Temp:      TempSrc:      SpO2: 100% 100% 100% 100%  Weight:      Height:        Wt Readings from Last 3 Encounters:  11/16/16 75.8 kg (167 lb)  06/24/16 76.3 kg (168 lb 3.2 oz)  02/28/16 76.3 kg (168 lb 4 oz)    General:  Appears calm and comfortable; A&Ox3 Eyes:  PERRL, EOMI, normal lids, iris ENT:  grossly normal hearing, lips & tongue Neck:  no LAD, masses or thyromegaly Cardiovascular:  RRR, no m/r/g. No LE edema.  Respiratory:  CTA bilaterally, no w/r/r. Normal respiratory effort. Abdomen:  soft, ntnd Skin:  no rash or induration seen on limited exam Musculoskeletal:  grossly normal tone BUE/BLE Psychiatric:  grossly normal mood and affect, speech fluent and appropriate Neurologic:  CN 2-12 grossly intact, moves all extremities in coordinated fashion.          Labs on Admission:  Basic Metabolic Panel:  Recent Labs Lab 11/16/16 1031  NA 138  K 4.0  CL 106  CO2 23  GLUCOSE 101*  BUN 19  CREATININE 1.02  CALCIUM 9.2   Liver Function Tests:  Recent Labs Lab 11/16/16 1031  AST 26  ALT 10*  ALKPHOS 75  BILITOT 0.8  PROT 7.4  ALBUMIN 3.4*    Recent Labs Lab 11/16/16 1031  LIPASE 34   No results for input(s): AMMONIA in the last 168 hours. CBC:  Recent Labs Lab 11/16/16 1031  WBC 6.5  HGB 10.8*  HCT 32.0*  MCV 93.0  PLT 146*   Cardiac Enzymes: No results for input(s): CKTOTAL, CKMB, CKMBINDEX, TROPONINI in the last 168 hours.  BNP (last 3 results) No results for input(s): BNP in the last 8760 hours.  ProBNP (last 3  results) No results for input(s): PROBNP in the last 8760 hours.   Serum creatinine: 1.02 mg/dL 16/01/9606/29/18 [id number] Estimated creatinine clearance: 61.5 mL/min  CBG:  Recent Labs Lab 11/16/16 1425  GLUCAP 80    Radiological Exams on Admission: Dg Chest Portable 1 View  Result Date: 11/16/2016 CLINICAL DATA:  Chest pain. EXAM: PORTABLE CHEST 1 VIEW COMPARISON:  09/25/2015 FINDINGS: Previous median sternotomy and CABG procedure. The heart size is normal. Aortic atherosclerosis. No pleural effusion or edema. IMPRESSION: 1. No acute cardiopulmonary abnormalities 2.  Aortic Atherosclerosis (ICD10-I70.0). Electronically Signed   By: Veronda Prudeaylor  Stroud M.D.  On: 11/16/2016 10:56    EKG: Independently reviewed. No stemi. NSR. RBBB  Assessment/Plan Principal Problem:   Chest pain Active Problems:   Diabetes mellitus with peripheral vascular disease (HCC)   Essential hypertension   Paroxysmal atrial fibrillation (HCC)   Lower extremity weakness   Alcohol dependence s/p withdrawl    CP/NSTEMI - serial trop ordered, initial pos, will trend - prn EKG CP - prn moprhine CP - prn ntg cp - asa in ED and QD - ECHO ordered for AM - tele bed, cardiac monitoring - ambien for sleep prn - zofran prn for nausea - UDS ordered - on heparin drip - cardio consult by EDP  Hypertension When necessary hydralazine 10 mg IV as needed for severe blood pressure Cont lopressor  DM  SSI AC Hold metformin  Afib Cont asas 81mg   ETOH abuse hx CIWA protocol  Hyperlipidemia Continue statin  BPH Cont flomax  Code Status: FULL  DVT Prophylaxis: heparin Family Communication: none Disposition Plan: Pending Improvement  Status: inpt tele  Haydee SalterPhillip M Hiran Leard, MD Family Medicine [facility] [email or web address] Password [email or web address]

## 2016-11-16 NOTE — Consult Note (Signed)
Cardiology Consultation:   Patient ID: Johnny [name]; [id number]; 10/22/1934   Admit date: 11/16/2016 Date of Consult: 11/16/2016  Primary Care Provider: Gordy SaversKwiatkowski, Peter F, MD Primary Cardiologist: Last saw Dr. SwazilandJordan (consult January 2017) Primary Electrophysiologist: Remotely Dr. Graciela [name]   Patient Profile:   Johnny [name] is an 82 y.o. male with a hx of HTN, HLD, DM, prior h/o ETOH abuse quite in 10/2015, CVA, CAD s/p CABG, PAF/aflutter s/p ablation 2009, PEA arrest in 04/2012 in the setting of withdraw and h/o seizure who is being seen today for the evaluation of NSTEMI at the request of Dr. Adela [name].  History of Present Illness:   [name] presents to the hospital describing recent episodes of worsening exertional chest pressure. He reports a particularly intense event on July 25 that lasted for several minutes. This has occurred with activities such as walking in his house, at one time when he tried to go into a store to shop. He has no regular cardiology follow-up in the office setting, although was seen by Dr. SwazilandJordan in consultation last year. He states that he has been taking his medications regularly. He has not undergone any recent cardiac structural or ischemic testing.  On assessment in the ER he is currently chest pain-free. Initial troponin I level I.17. ECG shows sinus rhythm with right bundle branch block and left anterior fascicular block which is old. Chest x-ray reveals no acute process.   Past Medical History:  Diagnosis Date  . Acute alcoholic hepatitis 05/20/2010  . Altered mental status 04/01/2012  . Anemia of other chronic disease 08/16/2007  . Arthritis   . CAD (coronary artery disease)    a. s/p CABG, notes unclear - 1998 or 2003?  Marland Kitchen. Chronic low back pain   . Coagulopathy (HCC)   . Diabetes mellitus (HCC)   . Essential hypertension   . ETOH abuse   . Hallucination 04/01/2012  . History of pneumonia   . History of stroke   . Hyperlipidemia   . Paroxysmal atrial  fibrillation (HCC)   . Paroxysmal atrial flutter (HCC)    a. s/p ablation 2007.  Marland Kitchen. PEA (Pulseless electrical activity) (HCC)    a. h/o PEA cardiac arrest 04/2012 (felt r/t PNA, acidosis, hypoxic resp failure in setting of alcohol withdrawal; required tracheostomy)  . Protein calorie malnutrition (HCC)   . Seizures (HCC) 04/01/2012    Past Surgical History:  Procedure Laterality Date  . CATARACT EXTRACTION W/ INTRAOCULAR LENS  IMPLANT, BILATERAL     "years ago" (04/01/2012)  . CORONARY ARTERY BYPASS GRAFT  ~ 2003   CABG X4  . ESOPHAGOGASTRODUODENOSCOPY  04/13/2012   Procedure: ESOPHAGOGASTRODUODENOSCOPY (EGD);  Surgeon: Florencia Reasonsobert V Buccini, MD;  [facility];  Service: Endoscopy;  Laterality: N/A;  push peg  . PEG PLACEMENT  04/13/2012   Procedure: PERCUTANEOUS ENDOSCOPIC GASTROSTOMY (PEG) PLACEMENT;  Surgeon: Florencia Reasonsobert V Buccini, MD;  Location: [facility] ENDOSCOPY;  Service: Endoscopy;  Laterality: N/A;     Outpatient Medications: No current facility-administered medications on file prior to encounter.    Current Outpatient Prescriptions on File Prior to Encounter  Medication Sig Dispense Refill  . ACCU-CHEK AVIVA PLUS test strip USE TO TEST BLOOD SUGAR ONCE A DAY 100 each 4  . ACCU-CHEK SOFTCLIX LANCETS lancets USE 1 EACH BY OTHER ROUTE DAILY AS NEEDED FOR OTHER. 100 each 0  . acetaminophen (TYLENOL) 325 MG tablet Take 2 tablets (650 mg total) by mouth every 6 (six) hours as needed for mild pain (or  Fever >/= 101). 30 tablet 0  . aspirin 81 MG tablet Take 81 mg by mouth daily.    Marland Kitchen atorvastatin (LIPITOR) 20 MG tablet TAKE 1 TABLET EVERY DAY 90 tablet 1  . Blood Glucose Calibration (ACCU-CHEK AVIVA) SOLN     . Lancet Devices (ADJUSTABLE LANCING DEVICE) MISC     . metFORMIN (GLUCOPHAGE) 500 MG tablet TAKE 1 TABLET (500 MG TOTAL) BY MOUTH 2 (TWO) TIMES DAILY WITH A MEAL. 180 tablet 1  . metoprolol (LOPRESSOR) 50 MG tablet TAKE 1.5 TABLETS (75 MG TOTAL) BY MOUTH 2 (TWO) TIMES DAILY. 180  tablet 1  . Multiple Vitamin (MULTIVITAMIN) tablet Take 1 tablet by mouth daily. Reported on 11/08/2015    . tamsulosin (FLOMAX) 0.4 MG CAPS capsule TAKE 1 CAPSULE (0.4 MG TOTAL) BY MOUTH DAILY. 90 capsule 3    Allergies:   No Known Allergies  Social History:   Social History   Social History  . Marital status: Married    Spouse name: N/A  . Number of children: N/A  . Years of education: N/A   Occupational History  . Not on file.   Social History Main Topics  . Smoking status: Former Smoker    Packs/day: 0.50    Years: 17.00    Types: Cigarettes    Quit date: 04/22/1983  . Smokeless tobacco: Never Used     Comment: 04/01/2012 "quit smoking 20+ yr ago"  . Alcohol use 10.8 oz/week    2 Glasses of wine, 16 Shots of liquor per week     Comment: hx of ETOH abuse; 04/01/2012 "drink ~ 1 pint//wk; vodka; cup of wine/wk" 04/2015 40 oz wine per day  . Drug use: No  . Sexual activity: No   Other Topics Concern  . Not on file   Social History Narrative  . No narrative on file    Family History:   The patient's family history includes Aneurysm in his sister; Diabetes in his sister; Heart failure in his brother and mother; Suicidality in his brother.  ROS:  Please see the history of present illness. Does not report any fevers or chills. No cough. Stable appetite. ROS  All other ROS reviewed and negative.     Physical Exam/Data:   Vitals:   11/16/16 1028  Pulse: 100  Resp: 18  Temp: 97.9 [name] (36.6 C)  TempSrc: Oral  SpO2: 100%  Weight: 167 lb (75.8 kg)  Height: 5\' 11"  (1.803 m)   No intake or output data in the 24 hours ending 11/16/16 1226 Filed Weights   11/16/16 1028  Weight: 167 lb (75.8 kg)   Body mass index is 23.29 kg/m.  Gen.: Chronically ill-appearing elderly male in no distress. HEENT: Conjunctiva and lids normal, oropharynx clear with poor dentition. Neck: Supple, no elevated JVP or carotid bruits, no thyromegaly. Lungs: Clear to auscultation, nonlabored  breathing at rest. Cardiac: Regular rate and rhythm, no S3, 2/6 systolic murmur, no pericardial rub. Abdomen: Soft, nontender, bowel sounds present. Extremities: No pitting edema, distal pulses 1-2+. Skin: Warm and dry. Musculoskeletal: Kyphosis. Neuropsychiatric: Alert and oriented x3, affect grossly appropriate.  EKG:  Tracing from 11/16/2016 shows sinus rhythm with right bundle branch block and left anterior fascicular block, old. Telemetry:  Telemetry was personally reviewed and demonstrates normal sinus rhythm.  Relevant CV Studies:  Echocardiogram 05/01/2015: Study Conclusions  - Left ventricle: The cavity size was normal. Wall thickness was   normal. The estimated ejection fraction was 40%. Difficult study   for wall  motion even with Definity contrast. Anteroseptal   hypokinesis. Inferior and inferolateral hypokinesis. Doppler   parameters are consistent with abnormal left ventricular   relaxation (grade 1 diastolic dysfunction). - Aortic valve: There was no stenosis. - Mitral valve: Moderately calcified annulus. Mildly calcified   leaflets . There was mild regurgitation. - Right ventricle: Poorly visualized. - Pulmonary arteries: No complete TR doppler jet so unable to   estimate PA systolic pressure. - Systemic veins: IVC not visualized.  Impressions:  - Technically difficult study with poor acoustic windows. Normal LV   size with EF estimated around 40%. Difficult study for wall   motion even with Definity, but there did appear to be   anteroseptal, inferior, and inferolateral hypokinesis which is   concerning for possible multivessel coronary disease. The RV was   not visualized.  Laboratory Data:  Chemistry Recent Labs Lab 11/16/16 1031  NA 138  K 4.0  CL 106  CO2 23  GLUCOSE 101*  BUN 19  CREATININE 1.02  CALCIUM 9.2  GFRNONAA >60  GFRAA >60  ANIONGAP 9     Recent Labs Lab 11/16/16 1031  PROT 7.4  ALBUMIN 3.4*  AST 26  ALT 10*  ALKPHOS 75   BILITOT 0.8   Hematology Recent Labs Lab 11/16/16 1031  WBC 6.5  RBC 3.44*  HGB 10.8*  HCT 32.0*  MCV 93.0  MCH 31.4  MCHC 33.8  RDW 13.6  PLT 146*   Cardiac EnzymesNo results for input(s): TROPONINI in the last 168 hours.  Recent Labs Lab 11/16/16 1056  TROPIPOC 1.17*     Radiology/Studies:  Dg Chest Portable 1 View  Result Date: 11/16/2016 CLINICAL DATA:  Chest pain. EXAM: PORTABLE CHEST 1 VIEW COMPARISON:  09/25/2015 FINDINGS: Previous median sternotomy and CABG procedure. The heart size is normal. Aortic atherosclerosis. No pleural effusion or edema. IMPRESSION: 1. No acute cardiopulmonary abnormalities 2.  Aortic Atherosclerosis (ICD10-I70.0). Electronically Signed   By: Signa Kellaylor  Stroud M.D.   On: 11/16/2016 10:56    Assessment and Plan:   1. Unstable angina symptoms over the last week with initial troponin I suggesting NSTEMI. ECG shows old right bundle branch block and left anterior fascicular block. He is chest pain-free at this time.  2. Multivessel CAD status post CABG, details not clear but surgery 10-15 years ago. He has not had regular cardiology follow-up. LVEF was 40% by echocardiogram last year consistent with ischemic cardiomyopathy.  3. History of paroxysmal atrial fibrillation/flutter, reportedly status post flutter ablation in 2007. He has not had regular follow-up and has not been on anticoagulation. CHADSVASC score is 8..  4. Essential hypertension.  5. Hyperlipidemia, on Lipitor.  6. Reported visualization of bed bugs per EMS at patient's house. He is on contact precautions.  Patient is being admitted to the internal medicine service. Recommend continuing IV Heparin and cycle full set of cardiac markers. Treat with aspirin, Lipitor, and Lopressor. Follow-up echocardiogram will be obtained as well. Would ultimately anticipate a cardiac catheterization, although looks like he will need to be treated for suspected bed bugs prior to this, and is on  contact isolation now. Question of whether he is a candidate for anticoagulation longer term with PAF will need to be addressed, but he has not demonstrated compliance with follow-up which is a concern. He would not be initiated on an oral agent yet until cardiac catheterization is performed.   Signed, Jonelle SidleSamuel G. McDowell, M.D., [name].A.C.C.

## 2016-11-17 ENCOUNTER — Other Ambulatory Visit (HOSPITAL_COMMUNITY): Payer: Medicare HMO

## 2016-11-17 ENCOUNTER — Encounter (HOSPITAL_COMMUNITY)
Admission: EM | Disposition: A | Discharge status: Home / Self Care | Payer: Self-pay | Source: Home / Self Care | Attending: Internal Medicine

## 2016-11-17 DIAGNOSIS — D649 Anemia, unspecified: Secondary | ICD-10-CM

## 2016-11-17 DIAGNOSIS — R778 Other specified abnormalities of plasma proteins: Secondary | ICD-10-CM

## 2016-11-17 DIAGNOSIS — D696 Thrombocytopenia, unspecified: Secondary | ICD-10-CM

## 2016-11-17 DIAGNOSIS — I2510 Atherosclerotic heart disease of native coronary artery without angina pectoris: Secondary | ICD-10-CM

## 2016-11-17 DIAGNOSIS — E782 Mixed hyperlipidemia: Secondary | ICD-10-CM

## 2016-11-17 DIAGNOSIS — R079 Chest pain, unspecified: Secondary | ICD-10-CM

## 2016-11-17 DIAGNOSIS — R7989 Other specified abnormal findings of blood chemistry: Secondary | ICD-10-CM

## 2016-11-17 DIAGNOSIS — F10220 Alcohol dependence with intoxication, uncomplicated: Secondary | ICD-10-CM

## 2016-11-17 HISTORY — PX: LEFT HEART CATH AND CORS/GRAFTS ANGIOGRAPHY: CATH118250

## 2016-11-17 LAB — CBC
HCT: 34.7 % — ABNORMAL LOW (ref 39.0–52.0)
Hemoglobin: 11.5 g/dL — ABNORMAL LOW (ref 13.0–17.0)
MCH: 31.1 pg (ref 26.0–34.0)
MCHC: 33.1 g/dL (ref 30.0–36.0)
MCV: 93.8 fL (ref 78.0–100.0)
PLATELETS: 139 10*3/uL — AB (ref 150–400)
RBC: 3.7 MIL/uL — ABNORMAL LOW (ref 4.22–5.81)
RDW: 13.9 % (ref 11.5–15.5)
WBC: 6.1 10*3/uL (ref 4.0–10.5)

## 2016-11-17 LAB — GLUCOSE, CAPILLARY
GLUCOSE-CAPILLARY: 99 mg/dL (ref 65–99)
GLUCOSE-CAPILLARY: 157 mg/dL — AB (ref 65–99)
GLUCOSE-CAPILLARY: 85 mg/dL (ref 65–99)
GLUCOSE-CAPILLARY: 109 mg/dL — AB (ref 65–99)

## 2016-11-17 LAB — PROTIME-INR
INR: 1.13
PROTHROMBIN TIME: 14.6 s (ref 11.4–15.2)

## 2016-11-17 LAB — POCT ACTIVATED CLOTTING TIME: ACTIVATED CLOTTING TIME: 147 s

## 2016-11-17 LAB — HEPARIN LEVEL (UNFRACTIONATED)
HEPARIN UNFRACTIONATED: 0.93 [IU]/mL — AB (ref 0.30–0.70)
Heparin Unfractionated: 0.9 IU/mL — ABNORMAL HIGH (ref 0.30–0.70)

## 2016-11-17 SURGERY — LEFT HEART CATH AND CORS/GRAFTS ANGIOGRAPHY
Anesthesia: LOCAL

## 2016-11-17 MED ORDER — SODIUM CHLORIDE 0.9% FLUSH: 3.0000 mL | Freq: Two times a day (BID) | INTRAVENOUS | Status: DC

## 2016-11-17 MED ORDER — IOPAMIDOL (ISOVUE-370) INJECTION 76%
INTRAVENOUS | Status: AC
  Filled 2016-11-17: qty 125

## 2016-11-17 MED ORDER — SODIUM CHLORIDE 0.9% FLUSH: 3.0000 mL | INTRAVENOUS | Status: DC | PRN

## 2016-11-17 MED ORDER — SODIUM CHLORIDE 0.9% FLUSH
3.0000 mL | Freq: Two times a day (BID) | INTRAVENOUS | Status: DC
  Administered 2016-11-18: 3 mL via INTRAVENOUS

## 2016-11-17 MED ORDER — SODIUM CHLORIDE 0.9 % WEIGHT BASED INFUSION: 3.0000 mL/kg/h | INTRAVENOUS | Status: DC

## 2016-11-17 MED ORDER — LIDOCAINE HCL (PF) 1 % IJ SOLN
INTRAMUSCULAR | Status: DC | PRN
  Administered 2016-11-17: 15 mL via INTRADERMAL

## 2016-11-17 MED ORDER — SODIUM CHLORIDE 0.9 % IV SOLN: 250.0000 mL | INTRAVENOUS | Status: DC | PRN

## 2016-11-17 MED ORDER — IOPAMIDOL (ISOVUE-370) INJECTION 76%
INTRAVENOUS | Status: DC | PRN
  Administered 2016-11-17: 75 mL via INTRA_ARTERIAL

## 2016-11-17 MED ORDER — SODIUM CHLORIDE 0.9 % IV SOLN: INTRAVENOUS | Status: AC

## 2016-11-17 MED ORDER — ASPIRIN 81 MG PO CHEW: 81.0000 mg | CHEWABLE_TABLET | ORAL | Status: DC

## 2016-11-17 MED ORDER — SODIUM CHLORIDE 0.9 % WEIGHT BASED INFUSION: 1.0000 mL/kg/h | INTRAVENOUS | Status: DC

## 2016-11-17 MED ORDER — ONDANSETRON HCL 4 MG/2ML IJ SOLN: 4.0000 mg | Freq: Four times a day (QID) | INTRAMUSCULAR | Status: DC | PRN

## 2016-11-17 MED ORDER — HEPARIN (PORCINE) IN NACL 2-0.9 UNIT/ML-% IJ SOLN
INTRAMUSCULAR | Status: AC
  Filled 2016-11-17: qty 1000

## 2016-11-17 MED ORDER — FENTANYL CITRATE (PF) 100 MCG/2ML IJ SOLN
INTRAMUSCULAR | Status: AC
  Filled 2016-11-17: qty 2

## 2016-11-17 MED ORDER — LIDOCAINE HCL (PF) 1 % IJ SOLN
INTRAMUSCULAR | Status: AC
  Filled 2016-11-17: qty 30

## 2016-11-17 MED ORDER — SODIUM CHLORIDE 0.9 % WEIGHT BASED INFUSION
3.0000 mL/kg/h | INTRAVENOUS | Status: DC
  Administered 2016-11-17: 3 mL/kg/h via INTRAVENOUS

## 2016-11-17 MED ORDER — MIDAZOLAM HCL 2 MG/2ML IJ SOLN
INTRAMUSCULAR | Status: AC
  Filled 2016-11-17: qty 2

## 2016-11-17 MED ORDER — ACETAMINOPHEN 325 MG PO TABS: 650.0000 mg | ORAL_TABLET | ORAL | Status: DC | PRN

## 2016-11-17 MED ORDER — LISINOPRIL 5 MG PO TABS
5.0000 mg | ORAL_TABLET | Freq: Every day | ORAL | Status: DC
  Administered 2016-11-17 – 2016-11-18 (×2): 5 mg via ORAL
  Filled 2016-11-17 (×2): qty 1

## 2016-11-17 MED ORDER — HEPARIN (PORCINE) IN NACL 2-0.9 UNIT/ML-% IJ SOLN
INTRAMUSCULAR | Status: AC | PRN
  Administered 2016-11-17: 1000 mL

## 2016-11-17 SURGICAL SUPPLY — 10 items
CATH INFINITI 5FR MULTPACK ANG (CATHETERS) ×2 IMPLANT
COVER PRB 48X5XTLSCP FOLD TPE (BAG) ×1 IMPLANT
COVER PROBE 5X48 (BAG) ×2
KIT HEART LEFT (KITS) ×2 IMPLANT
PACK CARDIAC CATHETERIZATION (CUSTOM PROCEDURE TRAY) ×2 IMPLANT
SHEATH PINNACLE 5F 10CM (SHEATH) ×2 IMPLANT
SYR MEDRAD MARK V 150ML (SYRINGE) ×2 IMPLANT
TRANSDUCER W/STOPCOCK (MISCELLANEOUS) ×2 IMPLANT
TUBING CIL FLEX 10 FLL-RA (TUBING) ×2 IMPLANT
WIRE EMERALD 3MM-J .035X150CM (WIRE) ×2 IMPLANT

## 2016-11-17 NOTE — Progress Notes (Signed)
PROGRESS NOTE   Johnny NewcomerDan E [name]  ZOX:[id number]    DOB: 1934/11/24    DOA: 11/16/2016  PCP: Gordy SaversKwiatkowski, Peter F, MD   I have briefly [facility]  Brief Narrative:  82 year old male, lives alone, ambulates with the help of a walker, PMH of CAD status post CABG, DM 2, HTN, CVA, HLD, PAF/atrial flutter status post ablation 2007, PEA arrest 04/2012 in setting of alcohol withdrawal, seizures, alcohol abuse (reports no alcohol intake since 10/05/16), former smoker (quit 20 years ago) presented with a couple days history of exertional chest pain. Chest pain lower mid substernal, pressure-like, up to 7/10 in intensity, nonradiating, associated with some dyspnea and denies long distance travel. Initial troponin 1.17. Cardiology consulted and admitted for further evaluation.   Assessment & Plan:   Principal Problem:   Chest pain Active Problems:   Diabetes mellitus with peripheral vascular disease (HCC)   Essential hypertension   Paroxysmal atrial fibrillation (HCC)   Lower extremity weakness   Alcohol dependence s/p withdrawl    1. NSTEMI: Reports ongoing exertional chest pain for 4-5 days PTA and especially worse on 7/25. EKG on admission showed old right bundle branch block and left anterior fascicular block. Troponins elevated: 1.36 > 1.33 > 1.28. Continue aspirin 325, atorvastatin 20, metoprolol 50 MG twice a day. IV heparin drip initiated. Cardiology consultation 7/29 appreciated: Follow echocardiogram and ultimately anticipate cardiac catheterization. Note regarding treating for bedbugs and contact isolation >discussed with charge nurse, no bedbugs found and contact isolation was discontinued. 2. CAD status post CABG/ischemic cardiomyopathy: As per cardiology, details not clear but surgery 10-15 years ago. Has not had regular cardiology follow-up. 2-D echo 05/01/15: LVEF 40% and grade 1 diastolic dysfunction. Follow repeat echo. 3. History of  paroxysmal atrial flutter/fibrillation: Status post flutter ablation 2007. No regular follow-up. CHADSVASC score is 8. As per cardiology, question of whether he is a candidate for anticoagulation long-term will need to be addressed but he has not demonstrated compliance with follow-up which is a concern. Currently on IV heparin for problem #1. 4. Essential hypertension: Controlled. Continue metoprolol. 5. Hyperlipidemia: Continue atorvastatin. 6. Type II DM: Well controlled in the hospital. On SSI. 7. History of CVA: No residual deficits: Continue aspirin and statins. 8. History of alcohol abuse: Reports that he has not had any alcohol intake since 10/05/16. Continue vitamin supplements and monitor for withdrawal. 9. Remote smoking history. 10. Functional paraparesis: PT evaluation. Reports ambulating at home with walker. 11. Normocytic anemia, chronic: Stable. Outpatient follow-up. 12. Thrombocytopenia, chronic: Stable. No bleeding reported.   DVT prophylaxis: Currently on IV heparin drip. Code Status: Full Family Communication: None at bedside Disposition: DC home when medically stable   Consultants:  Cardiology   Procedures:  None  Antimicrobials:  None    Subjective: Seen this morning. No recurrence of chest pain in the hospital. No dyspnea or palpitations reported.   ROS: No dizziness, lightheadedness.  Objective:  Vitals:   11/16/16 2326 11/17/16 0000 11/17/16 0600 11/17/16 0750  BP: (!) 150/45 (!) 150/45 128/61 (!) 145/63  Pulse: 64 64 67 65  Resp: 19   14  Temp:    98.4 F (36.9 C)  TempSrc:    Oral  SpO2: 99%   100%  Weight:      Height:        Examination:  General exam: Pleasant elderly male, moderately built and nourished lying comfortably supine in bed. Respiratory system: Clear to auscultation. Respiratory effort normal.  Midline sternotomy scar. Cardiovascular system: S1 & S2 heard, RRR. No JVD, murmurs, rubs, gallops or clicks. No pedal edema.  Telemetry: Sinus rhythm. Gastrointestinal system: Abdomen is nondistended, soft and nontender. No organomegaly or masses felt. Normal bowel sounds heard. Central nervous system: Alert and oriented. No focal neurological deficits. Extremities: Symmetric 5 x 5 power. Skin: No rashes, lesions or ulcers Psychiatry: Judgement and insight appear normal. Mood & affect appropriate.     Data Reviewed: I have personally reviewed following labs and imaging studies  CBC:  Recent Labs Lab 11/16/16 1031 11/17/16 0537  WBC 6.5 6.1  HGB 10.8* 11.5*  HCT 32.0* 34.7*  MCV 93.0 93.8  PLT 146* 139*   Basic Metabolic Panel:  Recent Labs Lab 11/16/16 1031  NA 138  K 4.0  CL 106  CO2 23  GLUCOSE 101*  BUN 19  CREATININE 1.02  CALCIUM 9.2   Liver Function Tests:  Recent Labs Lab 11/16/16 1031  AST 26  ALT 10*  ALKPHOS 75  BILITOT 0.8  PROT 7.4  ALBUMIN 3.4*   Coagulation Profile: No results for input(s): INR, PROTIME in the last 168 hours. Cardiac Enzymes:  Recent Labs Lab 11/16/16 1415 11/16/16 1823 11/16/16 2103  TROPONINI 1.36* 1.33* 1.28*   HbA1C: No results for input(s): HGBA1C in the last 72 hours. CBG:  Recent Labs Lab 11/16/16 1425 11/16/16 1624 11/16/16 1809 11/16/16 2124 11/17/16 0750  GLUCAP 80 67 109* 112* 99    No results found for this or any previous visit (from the past 240 hour(s)).       Radiology Studies: Dg Chest Portable 1 View  Result Date: 11/16/2016 CLINICAL DATA:  Chest pain. EXAM: PORTABLE CHEST 1 VIEW COMPARISON:  09/25/2015 FINDINGS: Previous median sternotomy and CABG procedure. The heart size is normal. Aortic atherosclerosis. No pleural effusion or edema. IMPRESSION: 1. No acute cardiopulmonary abnormalities 2.  Aortic Atherosclerosis (ICD10-I70.0). Electronically Signed   By: Signa Kellaylor  Stroud M.D.   On: 11/16/2016 10:56        Scheduled Meds: . alum & mag hydroxide-simeth  15 mL Oral Once  . aspirin  324 mg Oral Once    . aspirin EC  325 mg Oral Daily  . atorvastatin  20 mg Oral q1800  . folic acid  1 mg Oral Daily  . insulin aspart  0-9 Units Subcutaneous TID WC  . metoprolol tartrate  50 mg Oral BID  . multivitamin with minerals  1 tablet Oral Daily  . tamsulosin  0.4 mg Oral QPC breakfast  . thiamine  100 mg Oral Daily   Or  . thiamine  100 mg Intravenous Daily   Continuous Infusions: . heparin 700 Units/hr (11/17/16 0754)     LOS: 1 day     Johnny Rammel, MD, FACP, FHM. [facility] Pager [phone]  If 7PM-7AM, please contact night-coverage [email or web address] Password [email or web address] 11/17/2016, 11:09 AM

## 2016-11-17 NOTE — Progress Notes (Addendum)
Site area: RFA Site Prior to Removal:  Level 0 Pressure Applied For:20 min Manual:   yes Patient Status During Pull:  stable Post Pull Site:  Level 0 Post Pull Instructions Given:  yes Post Pull Pulses Present: doppler Dressing Applied: tegaderm  Bedrest begins @ 1815 till 2215 Comments:

## 2016-11-17 NOTE — Interval H&P Note (Signed)
Cath Lab Visit (complete for each Cath Lab visit)  Clinical Evaluation Leading to the Procedure:   ACS: Yes.    Non-ACS:    Anginal Classification: CCS IV  Anti-ischemic medical therapy: Minimal Therapy (1 class of medications)  Non-Invasive Test Results: No non-invasive testing performed  Prior CABG: Previous CABG      History and Physical Interval Note:  11/17/2016 4:44 PM  Johnny [name]  has presented today for surgery, with the diagnosis of cp  The various methods of treatment have been discussed with the patient and family. After consideration of risks, benefits and other options for treatment, the patient has consented to  Procedure(s): Left Heart Cath and Cors/Grafts Angiography (N/A) as a surgical intervention .  The patient's history has been reviewed, patient examined, no change in status, stable for surgery.  I have reviewed the patient's chart and labs.  Questions were answered to the patient's satisfaction.     Lance MussJayadeep Varanasi

## 2016-11-17 NOTE — Progress Notes (Signed)
ANTICOAGULATION CONSULT NOTE - Follow Up Consult  Pharmacy Consult for heparin Indication: NSTEMI  Labs:  Recent Labs  11/16/16 1031 11/16/16 1415 11/16/16 1823 11/16/16 2103 11/17/16 0537  HGB 10.8*  --   --   --  11.5*  HCT 32.0*  --   --   --  34.7*  PLT 146*  --   --   --  139*  HEPARINUNFRC  --   --   --  0.82* 0.93*  CREATININE 1.02  --   --   --   --   TROPONINI  --  1.36* 1.33* 1.28*  --     Assessment: 82yo male remains above goal on heparin with higher level despite rate decrease; RN is not able to determine where labs were drawn.  Goal of Therapy:  Heparin level 0.3-0.7 units/ml   Plan:  Will decrease heparin gtt by 2 units/kg/hr to 700 units/hr and check level in 8hr.  Vernard GamblesVeronda Jenascia Bumpass, PharmD, BCPS  11/17/2016,7:53 AM

## 2016-11-17 NOTE — Plan of Care (Signed)
Problem: Pain Managment: Goal: General experience of comfort will improve Outcome: Progressing Pt c/o muscle cramps during the shift. Pt given tylenol (see MAR) for muscle cramps with effective relief during the shift.

## 2016-11-17 NOTE — Progress Notes (Signed)
Pt CIWA score 0, pt sleeping at time of assessment. Caswell Corwinana C Eula Mazzola, RN 11/17/16 12:00 AM

## 2016-11-17 NOTE — Progress Notes (Addendum)
PT Cancellation Note  Patient Details Name: Johnny NewcomerDan E [name] MRN: [id number] DOB: [birth year]   Cancelled Treatment:    Reason Eval/Treat Not Completed: Patient not medically ready Pt on bedrest. Will await increase in activity orders prior to PT evaluation. Pt off floor on second attempt. Will follow.   Saban Heinlen A Graelyn Bihl 11/17/2016, 1:22 PM Mylo RedShauna Judithann Villamar, PT, DPT [phone]

## 2016-11-17 NOTE — H&P (View-Only) (Signed)
Progress Note  Patient Name: Johnny NewcomerDan E Schlachter Date of Encounter: 11/17/2016  Primary Cardiologist: Dr SwazilandJordan  Subjective   Chest pain free, no SOB.  Inpatient Medications    Scheduled Meds: . alum & mag hydroxide-simeth  15 mL Oral Once  . aspirin  324 mg Oral Once  . aspirin EC  325 mg Oral Daily  . atorvastatin  20 mg Oral q1800  . folic acid  1 mg Oral Daily  . insulin aspart  0-9 Units Subcutaneous TID WC  . metoprolol tartrate  50 mg Oral BID  . multivitamin with minerals  1 tablet Oral Daily  . tamsulosin  0.4 mg Oral QPC breakfast  . thiamine  100 mg Oral Daily   Or  . thiamine  100 mg Intravenous Daily   Continuous Infusions: . heparin 700 Units/hr (11/17/16 0754)   PRN Meds: acetaminophen, hydrALAZINE, LORazepam **OR** LORazepam, morphine injection, nitroGLYCERIN, ondansetron (ZOFRAN) IV, zolpidem   Vital Signs    Vitals:   11/16/16 2326 11/17/16 0000 11/17/16 0600 11/17/16 0750  BP: (!) 150/45 (!) 150/45 128/61 (!) 145/63  Pulse: 64 64 67 65  Resp: 19   14  Temp:    98.4 F (36.9 C)  TempSrc:    Oral  SpO2: 99%   100%  Weight:      Height:        Intake/Output Summary (Last 24 hours) at 11/17/16 1101 Last data filed at 11/17/16 0000  Gross per 24 hour  Intake           597.71 ml  Output              325 ml  Net           272.71 ml   Filed Weights   11/16/16 1028  Weight: 167 lb (75.8 kg)    Telemetry    SR - Personally Reviewed  ECG    SR - Personally Reviewed  Physical Exam   GEN: No acute distress.   Neck: No JVD Cardiac: RRR, no murmurs, rubs, or gallops.  Respiratory: Clear to auscultation bilaterally. GI: Soft, nontender, non-distended  MS: No edema; No deformity. Neuro:  Nonfocal  Psych: Normal affect   Labs    Chemistry Recent Labs Lab 11/16/16 1031  NA 138  K 4.0  CL 106  CO2 23  GLUCOSE 101*  BUN 19  CREATININE 1.02  CALCIUM 9.2  PROT 7.4  ALBUMIN 3.4*  AST 26  ALT 10*  ALKPHOS 75  BILITOT 0.8  GFRNONAA  >60  GFRAA >60  ANIONGAP 9     Hematology Recent Labs Lab 11/16/16 1031 11/17/16 0537  WBC 6.5 6.1  RBC 3.44* 3.70*  HGB 10.8* 11.5*  HCT 32.0* 34.7*  MCV 93.0 93.8  MCH 31.4 31.1  MCHC 33.8 33.1  RDW 13.6 13.9  PLT 146* 139*    Cardiac Enzymes Recent Labs Lab 11/16/16 1415 11/16/16 1823 11/16/16 2103  TROPONINI 1.36* 1.33* 1.28*    Recent Labs Lab 11/16/16 1056  TROPIPOC 1.17*     BNPNo results for input(s): BNP, PROBNP in the last 168 hours.   DDimer No results for input(s): DDIMER in the last 168 hours.   Radiology    Dg Chest Portable 1 View  Result Date: 11/16/2016 CLINICAL DATA:  Chest pain. EXAM: PORTABLE CHEST 1 VIEW COMPARISON:  09/25/2015 FINDINGS: Previous median sternotomy and CABG procedure. The heart size is normal. Aortic atherosclerosis. No pleural effusion or edema. IMPRESSION: 1. No acute  cardiopulmonary abnormalities 2.  Aortic Atherosclerosis (ICD10-I70.0). Electronically Signed   By: Taylor  Stroud M.D.   On: 11/16/2016 10:56     Patient Profile     82 y.o. male with a hx of HTN, HLD, DM, prior h/o ETOH abuse quite in 10/2015, CVA, CAD s/p CABG, PAF/aflutter s/p ablation 2009, PEA arrest in 04/2012 in the setting of withdraw and h/o seizure who is being seen today for the evaluation of NSTEMI at the request of Dr. Floyd.  Assessment & Plan    1. NSTEMI - ECG shows old right bundle branch block and left anterior fascicular block. He is chest pain-free at this time. We will schedule a left heart cath, he agrees. On heparin drip. Troponin now downtrending, I will decrease aspirin to 81 mg po daily.  2. Multivessel CAD status post CABG, details not clear but surgery 10-15 years ago. He has not had regular cardiology follow-up. LVEF was 40% by echocardiogram last year consistent with ischemic cardiomyopathy. We will schedule a new echocardiogram.  3. History of paroxysmal atrial fibrillation/flutter, reportedly status post flutter ablation in  2007. He has not had regular follow-up and has not been on anticoagulation. CHADSVASC score is 8.  4. Essential hypertension - add lisinopril 5 mg po daily  5. Hyperlipidemia, on Lipitor.  6. Reported visualization of bed bugs per EMS at patient's house. He is on contact precautions.  Signed, Xavier Fournier, MD  11/17/2016, 11:01 AM    

## 2016-11-17 NOTE — Progress Notes (Signed)
Progress Note  Patient Name: Johnny [name] Date of Encounter: 11/17/2016  Primary Cardiologist: Dr SwazilandJordan  Subjective   Chest pain free, no SOB.  Inpatient Medications    Scheduled Meds: . alum & mag hydroxide-simeth  15 mL Oral Once  . aspirin  324 mg Oral Once  . aspirin EC  325 mg Oral Daily  . atorvastatin  20 mg Oral q1800  . folic acid  1 mg Oral Daily  . insulin aspart  0-9 Units Subcutaneous TID WC  . metoprolol tartrate  50 mg Oral BID  . multivitamin with minerals  1 tablet Oral Daily  . tamsulosin  0.4 mg Oral QPC breakfast  . thiamine  100 mg Oral Daily   Or  . thiamine  100 mg Intravenous Daily   Continuous Infusions: . heparin 700 Units/hr (11/17/16 0754)   PRN Meds: acetaminophen, hydrALAZINE, LORazepam **OR** LORazepam, morphine injection, nitroGLYCERIN, ondansetron (ZOFRAN) IV, zolpidem   Vital Signs    Vitals:   11/16/16 2326 11/17/16 0000 11/17/16 0600 11/17/16 0750  BP: (!) 150/45 (!) 150/45 128/61 (!) 145/63  Pulse: 64 64 67 65  Resp: 19   14  Temp:    98.4 F (36.9 C)  TempSrc:    Oral  SpO2: 99%   100%  Weight:      Height:        Intake/Output Summary (Last 24 hours) at 11/17/16 1101 Last data filed at 11/17/16 0000  Gross per 24 hour  Intake           597.71 ml  Output              325 ml  Net           272.71 ml   Filed Weights   11/16/16 1028  Weight: 167 lb (75.8 kg)    Telemetry    SR - Personally Reviewed  ECG    SR - Personally Reviewed  Physical Exam   GEN: No acute distress.   Neck: No JVD Cardiac: RRR, no murmurs, rubs, or gallops.  Respiratory: Clear to auscultation bilaterally. GI: Soft, nontender, non-distended  MS: No edema; No deformity. Neuro:  Nonfocal  Psych: Normal affect   Labs    Chemistry Recent Labs Lab 11/16/16 1031  NA 138  K 4.0  CL 106  CO2 23  GLUCOSE 101*  BUN 19  CREATININE 1.02  CALCIUM 9.2  PROT 7.4  ALBUMIN 3.4*  AST 26  ALT 10*  ALKPHOS 75  BILITOT 0.8  GFRNONAA  >60  GFRAA >60  ANIONGAP 9     Hematology Recent Labs Lab 11/16/16 1031 11/17/16 0537  WBC 6.5 6.1  RBC 3.44* 3.70*  HGB 10.8* 11.5*  HCT 32.0* 34.7*  MCV 93.0 93.8  MCH 31.4 31.1  MCHC 33.8 33.1  RDW 13.6 13.9  PLT 146* 139*    Cardiac Enzymes Recent Labs Lab 11/16/16 1415 11/16/16 1823 11/16/16 2103  TROPONINI 1.36* 1.33* 1.28*    Recent Labs Lab 11/16/16 1056  TROPIPOC 1.17*     BNPNo results for input(s): BNP, PROBNP in the last 168 hours.   DDimer No results for input(s): DDIMER in the last 168 hours.   Radiology    Dg Chest Portable 1 View  Result Date: 11/16/2016 CLINICAL DATA:  Chest pain. EXAM: PORTABLE CHEST 1 VIEW COMPARISON:  09/25/2015 FINDINGS: Previous median sternotomy and CABG procedure. The heart size is normal. Aortic atherosclerosis. No pleural effusion or edema. IMPRESSION: 1. No acute  cardiopulmonary abnormalities 2.  Aortic Atherosclerosis (ICD10-I70.0). Electronically Signed   By: Signa Kellaylor  Stroud M.D.   On: 11/16/2016 10:56     Patient Profile     82 y.o. male with a hx of HTN, HLD, DM, prior h/o ETOH abuse quite in 10/2015, CVA, CAD s/p CABG, PAF/aflutter s/p ablation 2009, PEA arrest in 04/2012 in the setting of withdraw and h/o seizure who is being seen today for the evaluation of NSTEMI at the request of Dr. Adela LankFloyd.  Assessment & Plan    1. NSTEMI - ECG shows old right bundle branch block and left anterior fascicular block. He is chest pain-free at this time. We will schedule a left heart cath, he agrees. On heparin drip. Troponin now downtrending, I will decrease aspirin to 81 mg po daily.  2. Multivessel CAD status post CABG, details not clear but surgery 10-15 years ago. He has not had regular cardiology follow-up. LVEF was 40% by echocardiogram last year consistent with ischemic cardiomyopathy. We will schedule a new echocardiogram.  3. History of paroxysmal atrial fibrillation/flutter, reportedly status post flutter ablation in  2007. He has not had regular follow-up and has not been on anticoagulation. CHADSVASC score is 8.  4. Essential hypertension - add lisinopril 5 mg po daily  5. Hyperlipidemia, on Lipitor.  6. Reported visualization of bed bugs per EMS at patient's house. He is on contact precautions.  Signed, Tobias AlexanderKatarina Sayra Frisby, MD  11/17/2016, 11:01 AM

## 2016-11-18 ENCOUNTER — Inpatient Hospital Stay (HOSPITAL_COMMUNITY): Payer: Medicare HMO

## 2016-11-18 ENCOUNTER — Encounter (HOSPITAL_COMMUNITY): Payer: Self-pay | Admitting: Interventional Cardiology

## 2016-11-18 DIAGNOSIS — I5032 Chronic diastolic (congestive) heart failure: Secondary | ICD-10-CM

## 2016-11-18 LAB — CBC
HCT: 33.8 % — ABNORMAL LOW (ref 39.0–52.0)
HEMOGLOBIN: 11.8 g/dL — AB (ref 13.0–17.0)
MCH: 32.2 pg (ref 26.0–34.0)
MCHC: 34.9 g/dL (ref 30.0–36.0)
MCV: 92.3 fL (ref 78.0–100.0)
PLATELETS: 134 10*3/uL — AB (ref 150–400)
RBC: 3.66 MIL/uL — AB (ref 4.22–5.81)
RDW: 13.8 % (ref 11.5–15.5)
WBC: 6.6 10*3/uL (ref 4.0–10.5)

## 2016-11-18 LAB — ECHOCARDIOGRAM COMPLETE
HEIGHTINCHES: 71 in
WEIGHTICAEL: 2660.8 [oz_av]

## 2016-11-18 LAB — GLUCOSE, CAPILLARY
GLUCOSE-CAPILLARY: 80 mg/dL (ref 65–99)
Glucose-Capillary: 98 mg/dL (ref 65–99)

## 2016-11-18 MED ORDER — NITROGLYCERIN 0.4 MG SL SUBL: 0.4000 mg | SUBLINGUAL_TABLET | SUBLINGUAL | 0 refills | Status: AC | PRN

## 2016-11-18 MED ORDER — CLOPIDOGREL BISULFATE 75 MG PO TABS: 75.0000 mg | ORAL_TABLET | Freq: Every day | ORAL | 0 refills | Status: DC

## 2016-11-18 MED ORDER — METOPROLOL TARTRATE 50 MG PO TABS: 50.0000 mg | ORAL_TABLET | Freq: Two times a day (BID) | ORAL | 0 refills | Status: DC

## 2016-11-18 MED ORDER — METFORMIN HCL 500 MG PO TABS: 500.0000 mg | ORAL_TABLET | Freq: Two times a day (BID) | ORAL | Status: DC

## 2016-11-18 MED ORDER — FOLIC ACID 1 MG PO TABS: 1.0000 mg | ORAL_TABLET | Freq: Every day | ORAL | 0 refills | Status: DC

## 2016-11-18 MED ORDER — THIAMINE HCL 100 MG PO TABS: 100.0000 mg | ORAL_TABLET | Freq: Every day | ORAL | 0 refills | Status: DC

## 2016-11-18 MED ORDER — CLOPIDOGREL BISULFATE 75 MG PO TABS
75.0000 mg | ORAL_TABLET | Freq: Every day | ORAL | Status: DC
  Administered 2016-11-18: 75 mg via ORAL
  Filled 2016-11-18: qty 1

## 2016-11-18 MED ORDER — PERFLUTREN LIPID MICROSPHERE
1.0000 mL | INTRAVENOUS | Status: AC | PRN
  Administered 2016-11-18: 2 mL via INTRAVENOUS
  Filled 2016-11-18: qty 10

## 2016-11-18 MED ORDER — LISINOPRIL 5 MG PO TABS: 5.0000 mg | ORAL_TABLET | Freq: Every day | ORAL | 0 refills | Status: DC

## 2016-11-18 NOTE — Discharge Instructions (Signed)

## 2016-11-18 NOTE — Consult Note (Signed)
           [facility] Primary [facility] Navigator  11/18/2016  Jarvis NewcomerDan E Krock 01-12-1935 [id number]   Went to see patient at the bedside to identify possible discharge needs but RN reports that patient is off the unit for 2D Echo.  Will attempt to meet with patient at another time when he is available in the room.    For questions, please contact:  Wyatt HasteLorraine Thresea Doble, BSN, RN- [facility] Primary [facility] Navigator  Telephone: [phone] [facility]

## 2016-11-18 NOTE — Evaluation (Signed)
Physical Therapy Evaluation Patient Details Name: Johnny [name] MRN: [id number] DOB: Mar 23, 1935 Today's Date: 11/18/2016   History of Present Illness  82 y.o. male with a hx of HTN, HLD, DM, prior h/o ETOH abuse quit in 10/2015, CVA, CAD s/p CABG, PAF/aflutter s/p ablation 2009, PEA arrest in 04/2012 in the setting of withdraw and h/o seizure who was admitted due to NSTEMI.  S/p cath 11/17/16.  Clinical Impression  Patient presents with decreased mobility due to hospitalization and bedrest.  Currently S for short distance ambulation and transfers from higher surface (min A for bed mobility), but previously sleeps in lift chair and only walks short distances at home.  Feel he can safely d/c home with intermittent help and HHPT. Will follow if not d/c.     Follow Up Recommendations Home health PT;Supervision for mobility/OOB    Equipment Recommendations  None recommended by PT    Recommendations for Other Services       Precautions / Restrictions Precautions Precautions: Fall      Mobility  Bed Mobility Overal bed mobility: Needs Assistance Bed Mobility: Supine to Sit     Supine to sit: Min assist     General bed mobility comments: to scoot to EOB, able to come upright with rail, increased time  Transfers Overall transfer level: Needs assistance Equipment used: Rolling walker (2 wheeled) Transfers: Sit to/from Stand Sit to Stand: From elevated surface;Supervision         General transfer comment: increased time, heavy UE support, but able to stand with little elevation of bed height  Ambulation/Gait Ambulation/Gait assistance: Supervision Ambulation Distance (Feet): 50 Feet Assistive device: Rolling walker (2 wheeled) Gait Pattern/deviations: Step-through pattern;Step-to pattern;Shuffle;Trunk flexed;Decreased stride length     General Gait Details: mobilizing without A, but S for safety, cues for turns with walker, not able to straighten up   Stairs             Wheelchair Mobility    Modified Rankin (Stroke Patients Only)       Balance Overall balance assessment: Needs assistance   Sitting balance-Leahy Scale: Good     Standing balance support: Bilateral upper extremity supported Standing balance-Leahy Scale: Poor Standing balance comment: UE support for balance                             Pertinent Vitals/Pain Pain Assessment: No/denies pain    Home Living Family/patient expects to be discharged to:: Private residence Living Arrangements: Alone (nephews come and go) Available Help at Discharge: Family;Available PRN/intermittently Type of Home: House Home Access: Ramped entrance     Home Layout: One level Home Equipment: Walker - 2 wheels;Walker - 4 wheels Additional Comments: lift chair, aide 3x/week to help with sponge bath and little cleaning    Prior Function Level of Independence: Independent with assistive device(s)               Hand Dominance        Extremity/Trunk Assessment   Upper Extremity Assessment Upper Extremity Assessment: Generalized weakness    Lower Extremity Assessment Lower Extremity Assessment: Generalized weakness    Cervical / Trunk Assessment Cervical / Trunk Assessment: Kyphotic;Lordotic;Other exceptions Cervical / Trunk Exceptions: flexed posture throughout ambulation  Communication   Communication: No difficulties  Cognition Arousal/Alertness: Awake/alert Behavior During Therapy: WFL for tasks assessed/performed Overall Cognitive Status: Within Functional Limits for tasks assessed  General Comments General comments (skin integrity, edema, etc.): nephew in room during evaluation and reports able to assist pt at home if needed since PT donned pt's shoes, pt reports he can do it but has difficulty at home    Exercises     Assessment/Plan    PT Assessment Patient needs continued PT services  PT Problem  List Decreased strength;Decreased balance;Decreased knowledge of use of DME;Decreased mobility;Decreased activity tolerance       PT Treatment Interventions DME instruction;Gait training;Functional mobility training;Balance training;Therapeutic exercise;Patient/family education;Therapeutic activities    PT Goals (Current goals can be found in the Care Plan section)  Acute Rehab PT Goals Patient Stated Goal: To go home PT Goal Formulation: With patient/family Time For Goal Achievement: 11/22/16 Potential to Achieve Goals: Good    Frequency Min 3X/week   Barriers to discharge        Co-evaluation               AM-PAC PT "6 Clicks" Daily Activity  Outcome Measure Difficulty turning over in bed (including adjusting bedclothes, sheets and blankets)?: None Difficulty moving from lying on back to sitting on the side of the bed? : Total Difficulty sitting down on and standing up from a chair with arms (e.g., wheelchair, bedside commode, etc,.)?: Total Help needed moving to and from a bed to chair (including a wheelchair)?: None Help needed walking in hospital room?: None Help needed climbing 3-5 steps with a railing? : A Little 6 Click Score: 17    End of Session Equipment Utilized During Treatment: Gait belt Activity Tolerance: Patient tolerated treatment well Patient left: in chair;with call bell/phone within reach;with family/visitor present;with chair alarm set Nurse Communication: Mobility status PT Visit Diagnosis: Other abnormalities of gait and mobility (R26.89);Muscle weakness (generalized) (M62.81)    Time: [id number] PT Time Calculation (min) (ACUTE ONLY): 24 min   Charges:   PT Evaluation $PT Eval Moderate Complexity: 1 Mod PT Treatments $Gait Training: 8-22 mins   PT G Codes:   PT G-Codes **NOT FOR INPATIENT CLASS** Functional Assessment Tool Used: AM-PAC 6 Clicks Basic Mobility Functional Limitation: Mobility: Walking and moving around Mobility: Walking  and Moving Around Current Status ([id number]([id number]): At least 40 percent but less than 60 percent impaired, limited or restricted Mobility: Walking and Moving Around Goal Status [phone](G8979): At least 20 percent but less than 40 percent impaired, limited or restricted    Johnny [name], [place]PT [phone] 11/18/2016   Johnny [name] 11/18/2016, 4:08 PM

## 2016-11-18 NOTE — Progress Notes (Signed)
Discharge instructions given. Pt verbalized understanding and all questions were answered.  

## 2016-11-18 NOTE — Progress Notes (Signed)
  Echocardiogram 2D Echocardiogram has been performed.  Johnny PartridgeBrooke [name] Johnny [name] 11/18/2016, 10:55 AM

## 2016-11-18 NOTE — Progress Notes (Signed)
Progress Note  Patient Name: Johnny NewcomerDan E [name] Date of Encounter: 11/18/2016  Primary Cardiologist: Dr SwazilandJordan  Subjective   No chest pain, he is feeling better today.  Inpatient Medications    Scheduled Meds: . atorvastatin  20 mg Oral q1800  . folic acid  1 mg Oral Daily  . insulin aspart  0-9 Units Subcutaneous TID WC  . lisinopril  5 mg Oral Daily  . metoprolol tartrate  50 mg Oral BID  . multivitamin with minerals  1 tablet Oral Daily  . sodium chloride flush  3 mL Intravenous Q12H  . tamsulosin  0.4 mg Oral QPC breakfast  . thiamine  100 mg Oral Daily   Continuous Infusions: . sodium chloride     PRN Meds: sodium chloride, acetaminophen, hydrALAZINE, LORazepam **OR** LORazepam, morphine injection, nitroGLYCERIN, ondansetron (ZOFRAN) IV, perflutren lipid microspheres (DEFINITY) IV suspension, sodium chloride flush, zolpidem   Vital Signs    Vitals:   11/17/16 2143 11/18/16 0000 11/18/16 0142 11/18/16 0413  BP: 96/69  107/89 120/66  Pulse: 75 (!) 58 (!) 58 61  Resp: 18   17  Temp:    98.2 F (36.8 C)  TempSrc:      SpO2: (!) 81%   99%  Weight:    166 lb 4.8 oz (75.4 kg)  Height:        Intake/Output Summary (Last 24 hours) at 11/18/16 1107 Last data filed at 11/18/16 0930  Gross per 24 hour  Intake              360 ml  Output              950 ml  Net             -590 ml   Filed Weights   11/16/16 1028 11/18/16 0413  Weight: 167 lb (75.8 kg) 166 lb 4.8 oz (75.4 kg)    Telemetry    SR - Personally Reviewed  ECG    SR - Personally Reviewed  Physical Exam   GEN: No acute distress.   Neck: No JVD Cardiac: RRR, no murmurs, rubs, or gallops.  Respiratory: Clear to auscultation bilaterally. GI: Soft, nontender, non-distended  MS: No edema; No deformity.No bruit above right femoral insertion site, poor peripheral pulses B/L Neuro:  Nonfocal  Psych: Normal affect   Labs    Chemistry  Recent Labs Lab 11/16/16 1031  NA 138  K 4.0  CL 106  CO2  23  GLUCOSE 101*  BUN 19  CREATININE 1.02  CALCIUM 9.2  PROT 7.4  ALBUMIN 3.4*  AST 26  ALT 10*  ALKPHOS 75  BILITOT 0.8  GFRNONAA >60  GFRAA >60  ANIONGAP 9     Hematology  Recent Labs Lab 11/16/16 1031 11/17/16 0537 11/18/16 0350  WBC 6.5 6.1 6.6  RBC 3.44* 3.70* 3.66*  HGB 10.8* 11.5* 11.8*  HCT 32.0* 34.7* 33.8*  MCV 93.0 93.8 92.3  MCH 31.4 31.1 32.2  MCHC 33.8 33.1 34.9  RDW 13.6 13.9 13.8  PLT 146* 139* 134*    Cardiac Enzymes  Recent Labs Lab 11/16/16 1415 11/16/16 1823 11/16/16 2103  TROPONINI 1.36* 1.33* 1.28*     Recent Labs Lab 11/16/16 1056  TROPIPOC 1.17*     BNPNo results for input(s): BNP, PROBNP in the last 168 hours.   DDimer No results for input(s): DDIMER in the last 168 hours.   Radiology    LHC: 11/17/16  Ost Cx to Prox Cx lesion, 100 %  stenosed. Sequential SVG to OMs is patent.  Ost LAD to Prox LAD lesion, 99 %stenosed, Mid LAD lesion, 100 %stenosed. LIMA to LAD is patent. Diffuse Dist LAD lesion, 75 %stenosed.  Ost 1st Diag to 1st Diag lesion, 99 %stenosed, diffusely beyond graft insertion. SVG to diagonal is patent.  Mid RCA lesion, 100 %stenosed. SVG to PDA is patent. RPDA lesion, 60 %stenosed diffusely beyond graft insertion.  LIMA and is anatomically normal.  There is moderate left ventricular systolic dysfunction.  The left ventricular ejection fraction is 35-45% by visual estimate.  There is mild (2+) mitral regurgitation.  There is no aortic valve stenosis.   Severe three vessel disease.  Severe native CAD beyond the insertions of the grafts, which is not amenable to PCI.  Elevated troponin likely from small vessel disease.    LV gram shows a Takotsubo variant pattern with akinesis at the base and normal contraction at the apex.     Patient Profile     82 y.o. male with a hx of HTN, HLD, DM, prior h/o ETOH abuse quite in 10/2015, CVA, CAD s/p CABG, PAF/aflutter s/p ablation 2009, PEA arrest in 04/2012 in  the setting of withdraw and h/o seizure who is being seen today for the evaluation of NSTEMI at the request of Dr. Adela LankFloyd.  Assessment & Plan    1. NSTEMI - ECG shows old right bundle branch block and left anterior fascicular block. He is chest pain-free at this time. A a left heart cath showed Severe three vessel disease.  Severe native CAD beyond the insertions of the grafts, which is not amenable to PCI.  Elevated troponin likely from small vessel disease.  LV gram shows a Takotsubo variant pattern with akinesis at the base and normal contraction at the apex.  Echo done, results are pending, I will add Plavix as he had NSTEMI. Discharge today, we will arrange for a an outpatient follow up.  2. Ischemic CMP LVEF 35-40% per LV gram, echo pending, continue metoprolol and lisinopril, BP low, unable to uptitrate  3. History of paroxysmal atrial fibrillation/flutter, reportedly status post flutter ablation in 2007. He has not had regular follow-up and has not been on anticoagulation. CHADSVASC score is 8.  4. Essential hypertension - add lisinopril 5 mg po daily  5. Hyperlipidemia, on Lipitor.  6. Reported visualization of bed bugs per EMS at patient's house. He is on contact precautions.  Signed, Tobias AlexanderKatarina Delray Reza, MD  11/18/2016, 11:07 AM

## 2016-11-18 NOTE — Care Management Note (Addendum)
Case Management Note  Patient Details  Name: Johnny NewcomerDan E Abraha MRN: [id number] Date of Birth: 1935-01-26  Subjective/Objective: Pt presented for Chest Pain- Pt is from a one level home with his nephew. Pt states Nephew works in [employer]rap[employer], but most of the times he is at home. Pt has DME: RW and Wheelchair. Per pt he would like to return home. Pt states his daughters pick up his medications and deliver them to him and they take him to Dr's appointments. [facility]. PT/OT to consult.                   Action/Plan: CM will continue to monitor for additional needs.   Expected Discharge Date:                  Expected Discharge Plan:  Home w Home Health Services  In-House Referral:  NA  Discharge planning Services  CM Consult  Post Acute Care Choice:  Home Health Choice offered to:   Patient  DME Arranged:   N/A DME Agency:   N/A  HH Arranged:   PT HH Agency:   [facility]  Status of Service: Completed If discussed at Long Length of Stay Meetings, dates discussed:    Additional Comments: 1543 11-18-16 Tomi BambergerBrenda Graves-Bigelow, RN,BSN [phone] [facility] Provided and [facility]. [facility] within 24-48 hours post d/c.  Gala LewandowskyGraves-Bigelow, Cashay Manganelli Kaye, RN 11/18/2016, 12:09 PM

## 2016-11-18 NOTE — Discharge Summary (Addendum)
Physician Discharge Summary  Johnny NewcomerDan E Tribby ZOX:[id number] DOB: 1934-12-23  PCP: Gordy SaversKwiatkowski, Peter F, MD  Admit date: 11/16/2016 Discharge date: 11/18/2016  Recommendations for Outpatient Follow-up:  1. Azalee CourseHao Meng, PA/Cardiology on 11/26/16 at 10:30 AM. To be seen with repeat labs (CBC & BMP). 2. Dr. Eleonore ChiquitoPeter Kwiatkowski, PCP  Home Health: PT Equipment/Devices: None    Discharge Condition: Improved and stable.  CODE STATUS: Full  Diet recommendation: Heart Healthy & Diabetic diet.  Discharge Diagnoses:  Principal Problem:   Chest pain Active Problems:   Diabetes mellitus with peripheral vascular disease (HCC)   Essential hypertension   Paroxysmal atrial fibrillation (HCC)   Lower extremity weakness   Alcohol dependence s/p withdrawl    Elevated troponin   Brief Summary: 82 year old male, lives alone, ambulates with the help of a walker, PMH of CAD status post CABG, DM 2, HTN, CVA, HLD, PAF/atrial flutter status post ablation 2007, PEA arrest 04/2012 in setting of alcohol withdrawal, seizures, alcohol abuse (reports no alcohol intake since 10/05/16), former smoker (quit 20 years ago) presented with a couple days history of exertional chest pain. Chest pain lower mid substernal, pressure-like, up to 7/10 in intensity, nonradiating, associated with some dyspnea and denies long distance travel. Initial troponin 1.17. Cardiology consulted and he was admitted for further evaluation.   Assessment & Plan:   1. NSTEMI: Reported ongoing exertional chest pain for 4-5 days PTA and especially worse on 7/25. EKG on admission showed old right bundle branch block and left anterior fascicular block. Troponins elevated: 1.36 > 1.33 > 1.28. Continue aspirin 81, atorvastatin 20, metoprolol 50 MG twice a day. IV heparin drip initiated. Cardiology was consulted and he underwent cardiac cath on 11/17/16 which showed severe three-vessel disease. Severe native CAD beyond the insertion of the grafts, which are not  amenable to PCI. Elevated troponin felt to be likely from small vessel disease. LV gram showed a Takotsubo variant pattern with akinesis at the base and normal contraction at the apex. 2-D echo showed LVEF 40-45 percent. Probable hypokinesis of the inferolateral myocardium, mid-apical anteroseptal myocardium and grade 1 diastolic dysfunction. As discussed with cardiology, Plavix added to aspirin 81 MG daily & DAPT to continue for a year. Cardiology has cleared for discharge and have arranged close outpatient follow-up. 2. CAD status post CABG/Ischemic cardiomyopathy: Evaluation as indicated above. Metoprolol dose was reduced from 75-50 MG twice a day and lisinopril 5 MG daily was started. Titrate outpatient as deemed necessary. 3. History of paroxysmal atrial flutter/fibrillation: Status post flutter ablation 2007. No regular follow-up. CHADSVASC score is 8. As per cardiology, question of whether he is a candidate for anticoagulation long-term will need to be addressed but he has not demonstrated compliance with follow-up which is a concern. Outpatient follow-up with Cardiology to determine whether he is a safe candidate to start long-term anticoagulation. 4. Essential hypertension: Controlled. Continue metoprolol and lisinopril as indicated above. 5. Hyperlipidemia: Continue atorvastatin. 6. Type II DM: Well controlled in the hospital. On SSI in the hospital. Resume metformin 48 hours after cardiac catheterization. 7. History of CVA: No residual deficits: Continue aspirin and statins. Plavix now added due to NSTEMI. 8. History of alcohol abuse: Reported that he has not had any alcohol intake since 10/05/16. Continue vitamin supplements. No withdrawal signs or symptoms. 9. Remote smoking history. 10. Functional paraparesis: PT evaluated and recommended home health PT. 11. Normocytic anemia, chronic: Stable. Outpatient follow-up. 12. Thrombocytopenia, chronic: Stable. No bleeding  reported.    Consultants:  Cardiology  Procedures:  Procedures   11/17/16 Left Heart Cath and Cors/Grafts Angiography  Conclusion     Ost Cx to Prox Cx lesion, 100 %stenosed. Sequential SVG to OMs is patent.  Ost LAD to Prox LAD lesion, 99 %stenosed, Mid LAD lesion, 100 %stenosed. LIMA to LAD is patent. Diffuse Dist LAD lesion, 75 %stenosed.  Ost 1st Diag to 1st Diag lesion, 99 %stenosed, diffusely beyond graft insertion. SVG to diagonal is patent.  Mid RCA lesion, 100 %stenosed. SVG to PDA is patent. RPDA lesion, 60 %stenosed diffusely beyond graft insertion.  LIMA and is anatomically normal.  There is moderate left ventricular systolic dysfunction.  The left ventricular ejection fraction is 35-45% by visual estimate.  There is mild (2+) mitral regurgitation.  There is no aortic valve stenosis.   Severe three vessel disease.  Severe native CAD beyond the insertions of the grafts, which is not amenable to PCI.  Elevated troponin likely from small vessel disease.    LV gram shows a Takotsubo variant pattern with akinesis at the base and normal contraction at the apex.      2-D echo 11/18/16: Study Conclusions  - Left ventricle: The cavity size was normal. Wall thickness was   normal. Systolic function was mildly to moderately reduced. The   estimated ejection fraction was in the range of 40% to 45%.   Probable hypokinesis of the inferolateral myocardium. Probable   hypokinesis of the mid-apicalanteroseptal myocardium. Doppler   parameters are consistent with abnormal left ventricular   relaxation (grade 1 diastolic dysfunction). Acoustic contrast   opacification revealed no evidence ofthrombus. - Aortic valve: Noncoronary cusp mobility was severely restricted. - Mitral valve: Calcified annulus. Mildly thickened leaflets .   There was mild regurgitation.   Discharge Instructions  Discharge Instructions    Call MD for:  difficulty breathing, headache or  visual disturbances    Complete by:  As directed    Call MD for:  extreme fatigue    Complete by:  As directed    Call MD for:  persistant dizziness or light-headedness    Complete by:  As directed    Call MD for:  redness, tenderness, or signs of infection (pain, swelling, redness, odor or green/yellow discharge around incision site)    Complete by:  As directed    Call MD for:  severe uncontrolled pain    Complete by:  As directed    Diet - low sodium heart healthy    Complete by:  As directed    Diet Carb Modified    Complete by:  As directed    Discharge instructions    Complete by:  As directed    Metformin is being held for 48 hours after the contrast you received for heart catheterization on 7/30. You may resume metformin on 11/20/16.   Increase activity slowly    Complete by:  As directed        Medication List    TAKE these medications   ACCU-CHEK AVIVA PLUS test strip Generic drug:  glucose blood USE TO TEST BLOOD SUGAR ONCE A DAY   ACCU-CHEK AVIVA Soln   ACCU-CHEK SOFTCLIX LANCETS lancets USE 1 EACH BY OTHER ROUTE DAILY AS NEEDED FOR OTHER.   acetaminophen 325 MG tablet Commonly known as:  TYLENOL Take 2 tablets (650 mg total) by mouth every 6 (six) hours as needed for mild pain (or Fever >/= 101).   Adjustable Lancing Device Misc   aspirin 81 MG tablet Take 81 mg by mouth  daily.   atorvastatin 20 MG tablet Commonly known as:  LIPITOR TAKE 1 TABLET EVERY DAY   clopidogrel 75 MG tablet Commonly known as:  PLAVIX Take 1 tablet (75 mg total) by mouth daily.   folic acid 1 MG tablet Commonly known as:  FOLVITE Take 1 tablet (1 mg total) by mouth daily.   lisinopril 5 MG tablet Commonly known as:  PRINIVIL,ZESTRIL Take 1 tablet (5 mg total) by mouth daily.   metFORMIN 500 MG tablet Commonly known as:  GLUCOPHAGE Take 1 tablet (500 mg total) by mouth 2 (two) times daily with a meal. Start taking on:  11/20/2016 What changed:  These instructions start on  11/20/2016. If you are unsure what to do until then, ask your doctor or other care provider.   metoprolol tartrate 50 MG tablet Commonly known as:  LOPRESSOR Take 1 tablet (50 mg total) by mouth 2 (two) times daily. What changed:  See the new instructions.   multivitamin tablet Take 1 tablet by mouth daily. Reported on 11/08/2015   nitroGLYCERIN 0.4 MG SL tablet Commonly known as:  NITROSTAT Place 1 tablet (0.4 mg total) under the tongue every 5 (five) minutes as needed for chest pain. Up to 3 doses.   tamsulosin 0.4 MG Caps capsule Commonly known as:  FLOMAX TAKE 1 CAPSULE (0.4 MG TOTAL) BY MOUTH DAILY.   thiamine 100 MG tablet Take 1 tablet (100 mg total) by mouth daily.      Follow-up Information    Azalee CourseMeng, Hao, [place]. Go on 11/26/2016.   Specialties:  Cardiology, Radiology Why:  You appointment is at 10;30 am please arrive 15 minutes early. To be seen with repeat labs (CBC & BMP). Contact information: [address] [place] [address] [phone]        Gordy SaversKwiatkowski, Peter F, MD. Schedule an appointment as soon as possible for a visit.   Specialty:  Internal Medicine Contact information: [address] [place] [address] [phone]          No Known Allergies    Procedures/Studies: Dg Chest Portable 1 View  Result Date: 11/16/2016 CLINICAL DATA:  Chest pain. EXAM: PORTABLE CHEST 1 VIEW COMPARISON:  09/25/2015 FINDINGS: Previous median sternotomy and CABG procedure. The heart size is normal. Aortic atherosclerosis. No pleural effusion or edema. IMPRESSION: 1. No acute cardiopulmonary abnormalities 2.  Aortic Atherosclerosis (ICD10-I70.0). Electronically Signed   By: Signa Kellaylor  Stroud M.D.   On: 11/16/2016 10:56      Subjective: Patient denied chest pain. Denies any other complaints. No dyspnea, palpitations, dizziness or lightheadedness reported.  Discharge Exam:  Vitals:   11/18/16 0000 11/18/16 0142 11/18/16 0413 11/18/16 1445  BP:   107/89 120/66 (!) 120/48  Pulse: (!) 58 (!) 58 61 95  Resp:   17 (!) 22  Temp:   98.2 F (36.8 C) 98.1 F (36.7 C)  TempSrc:    Oral  SpO2:   99% 99%  Weight:   75.4 kg (166 lb 4.8 oz)   Height:        General exam: Pleasant elderly male, moderately built and nourished sitting up in bed eating breakfast this morning. Respiratory system: Clear to auscultation. Respiratory effort normal. Midline sternotomy scar. Cardiovascular system: S1 & S2 heard, RRR. No JVD, murmurs, rubs, gallops or clicks. No pedal edema. Telemetry: Sinus rhythm - SB in the 50's. Gastrointestinal system: Abdomen is nondistended, soft and nontender. No organomegaly or masses felt. Normal bowel sounds heard. Central nervous system: Alert and  oriented. No focal neurological deficits. Extremities: Symmetric 5 x 5 power. Skin: No rashes, lesions or ulcers Psychiatry: Judgement and insight appear normal. Mood & affect appropriate.     The results of significant diagnostics from this hospitalization (including imaging, microbiology, ancillary and laboratory) are listed below for reference.       Labs: CBC:  Recent Labs Lab 11/16/16 1031 11/17/16 0537 11/18/16 0350  WBC 6.5 6.1 6.6  HGB 10.8* 11.5* 11.8*  HCT 32.0* 34.7* 33.8*  MCV 93.0 93.8 92.3  PLT 146* 139* 134*   Basic Metabolic Panel:  Recent Labs Lab 11/16/16 1031  NA 138  K 4.0  CL 106  CO2 23  GLUCOSE 101*  BUN 19  CREATININE 1.02  CALCIUM 9.2   Liver Function Tests:  Recent Labs Lab 11/16/16 1031  AST 26  ALT 10*  ALKPHOS 75  BILITOT 0.8  PROT 7.4  ALBUMIN 3.4*   Cardiac Enzymes:  Recent Labs Lab 11/16/16 1415 11/16/16 1823 11/16/16 2103  TROPONINI 1.36* 1.33* 1.28*   CBG:  Recent Labs Lab 11/17/16 1121 11/17/16 1751 11/17/16 2131 11/18/16 0741 11/18/16 1120  GLUCAP 157* 85 109* 80 98       Time coordinating discharge: Less than 30 minutes  SIGNED:  Marcellus Scott, MD, FACP, FHM. [facility] Pager [phone]  If 7PM-7AM, please contact night-coverage [email or web address] Password [email or web address] 11/18/2016, 4:02 PM

## 2016-11-18 NOTE — Plan of Care (Signed)
Problem: Activity: Goal: Ability to tolerate increased activity will improve Outcome: Progressing Awaiting working with physical therapy

## 2016-11-18 NOTE — Plan of Care (Signed)
Problem: Safety: Goal: Ability to remain free from injury will improve Outcome: Progressing Bed alarm on, call light and personal items within reach, pt agrees to call staff for assistance out of bed.    Problem: Pain Managment: Goal: General experience of comfort will improve Outcome: Completed/Met Date Met: 11/18/16 Pt has denied c/o pain or discomfort throughout this shift.  Resting comfortably in bed.  Problem: Physical Regulation: Goal: Ability to maintain clinical measurements within normal limits will improve Outcome: Progressing VSS Goal: Will remain free from infection Outcome: Progressing No s/s of infection noted.  Problem: Activity: Goal: Risk for activity intolerance will decrease Outcome: Progressing Pt is from home with generalized weakness, requiring 2 assists to transfer from bed to bedside commode.  Awaiting PT eval.  Problem: Activity: Goal: Ability to return to baseline activity level will improve Outcome: Progressing Awaiting PT eval.  Problem: Cardiovascular: Goal: Vascular access site(s) Level 0-1 will be maintained Outcome: Completed/Met Date Met: 11/18/16 Right groin with no s/s of bleeding or hematoma.

## 2016-11-18 NOTE — Progress Notes (Signed)
HR 50s, BP 96/69.  Metoprolol not given.  Will continue to monitor.  Johnny [name], Johnny [name]

## 2016-11-20 ENCOUNTER — Telehealth: Payer: Self-pay

## 2016-11-20 ENCOUNTER — Telehealth: Payer: Self-pay | Admitting: Internal Medicine

## 2016-11-20 DIAGNOSIS — I2510 Atherosclerotic heart disease of native coronary artery without angina pectoris: Secondary | ICD-10-CM | POA: Diagnosis not present

## 2016-11-20 DIAGNOSIS — E1151 Type 2 diabetes mellitus with diabetic peripheral angiopathy without gangrene: Secondary | ICD-10-CM | POA: Diagnosis not present

## 2016-11-20 DIAGNOSIS — I10 Essential (primary) hypertension: Secondary | ICD-10-CM | POA: Diagnosis not present

## 2016-11-20 DIAGNOSIS — I4892 Unspecified atrial flutter: Secondary | ICD-10-CM | POA: Diagnosis not present

## 2016-11-20 DIAGNOSIS — I214 Non-ST elevation (NSTEMI) myocardial infarction: Secondary | ICD-10-CM | POA: Diagnosis not present

## 2016-11-20 DIAGNOSIS — I480 Paroxysmal atrial fibrillation: Secondary | ICD-10-CM | POA: Diagnosis not present

## 2016-11-20 NOTE — Telephone Encounter (Signed)
D/C 11/18/16 To: home  Spoke with pt and he states that he is doing some better. He reports continued angina with exertion. He is using a wheelchair and walker for ambulation. He is able to handle most ADL's without assistance. He is not driving. He has help from his daughter. He has not started his new medications because he is dependent on his daughter to get things for him and she has been at work. He expects to have this later today. Advised pt to start medications as soon as possible. He voiced understanding. [facility]. No other questions or concerns.   Appt scheduled with Dr. Kirtland BouchardK 11/25/16, pt aware.    Transition Care Management Follow-up Telephone Call  How have you been since you were released from the hospital? Improving some, still slight angina with exertion   Do you understand why you were in the hospital? yes   Do you understand the discharge instrcutions? yes  Items Reviewed:  Medications reviewed: yes  Allergies reviewed: yes  Dietary changes reviewed: yes  Referrals reviewed: yes   Functional Questionnaire:   Activities of Daily Living (ADLs):   He states they are independent in the following: ambulation, bathing and hygiene, feeding, continence, grooming, toileting and dressing States they require assistance with the following: driving   Any transportation issues/concerns?: no   Any patient concerns? no   Confirmed importance and date/time of follow-up visits scheduled: yes   Confirmed with patient if condition begins to worsen call PCP or go to the ER.  Patient was given the Call-a-Nurse line [phone]: yes

## 2016-11-20 NOTE — Telephone Encounter (Signed)
° °  Shree with [facility] call to ask for verbal orders for PT and OT    [phone]

## 2016-11-20 NOTE — Telephone Encounter (Signed)
Verbal orders given per Dr K  

## 2016-11-24 ENCOUNTER — Ambulatory Visit: Payer: Medicare HMO | Admitting: Internal Medicine

## 2016-11-24 ENCOUNTER — Telehealth: Payer: Self-pay | Admitting: Internal Medicine

## 2016-11-24 NOTE — Telephone Encounter (Signed)
Sree w/ [facility] would like a verbal for home health nursing for eval and med management.

## 2016-11-25 ENCOUNTER — Encounter: Payer: Self-pay | Admitting: Internal Medicine

## 2016-11-25 ENCOUNTER — Ambulatory Visit (INDEPENDENT_AMBULATORY_CARE_PROVIDER_SITE_OTHER): Payer: Medicare HMO | Admitting: Internal Medicine

## 2016-11-25 VITALS — HR 100 | Temp 98.0°F | Ht 71.0 in | Wt 169.4 lb

## 2016-11-25 DIAGNOSIS — I209 Angina pectoris, unspecified: Secondary | ICD-10-CM

## 2016-11-25 DIAGNOSIS — I2510 Atherosclerotic heart disease of native coronary artery without angina pectoris: Secondary | ICD-10-CM

## 2016-11-25 DIAGNOSIS — I480 Paroxysmal atrial fibrillation: Secondary | ICD-10-CM | POA: Diagnosis not present

## 2016-11-25 DIAGNOSIS — E1151 Type 2 diabetes mellitus with diabetic peripheral angiopathy without gangrene: Secondary | ICD-10-CM | POA: Diagnosis not present

## 2016-11-25 DIAGNOSIS — I10 Essential (primary) hypertension: Secondary | ICD-10-CM | POA: Diagnosis not present

## 2016-11-25 DIAGNOSIS — R531 Weakness: Secondary | ICD-10-CM | POA: Diagnosis not present

## 2016-11-25 LAB — POCT GLYCOSYLATED HEMOGLOBIN (HGB A1C): HEMOGLOBIN A1C: 5.6

## 2016-11-25 MED ORDER — METFORMIN HCL 500 MG PO TABS: 500.0000 mg | ORAL_TABLET | Freq: Every day | ORAL | Status: DC

## 2016-11-25 NOTE — Telephone Encounter (Signed)
Johnny [name] went to do the eval. Johnny [name] needs to put off this eval for maybe 2 weeks. Pt has bed bugs.  The exterminators will be there today.  Johnny [name] states he will check back in a week or so and see how things are going.

## 2016-11-25 NOTE — Telephone Encounter (Signed)
Spoke with Sree, eval will be delayed

## 2016-11-25 NOTE — Progress Notes (Signed)
Subjective:    Patient ID: Johnny [name], male    DOB: 05-Nov-1934, 82 y.o.   MRN: [id number]  HPI   Admit date: 11/16/2016 Discharge date: 11/18/2016  Recommendations for Outpatient Follow-up:  1. Azalee CourseHao Meng, PA/Cardiology on 11/26/16 at 10:30 AM. To be seen with repeat labs (CBC & BMP). 2. Dr. Eleonore ChiquitoPeter Kwiatkowski, PCP  Home Health: PT Equipment/Devices: None    Discharge Condition: Improved and stable.  CODE STATUS: Full  Diet recommendation: Heart Healthy & Diabetic diet.  Discharge Diagnoses:  Principal Problem:   Chest pain Active Problems:   Diabetes mellitus with peripheral vascular disease (HCC)   Essential hypertension   Paroxysmal atrial fibrillation (HCC)   Lower extremity weakness   Alcohol dependence s/p withdrawl    Elevated troponin  Severe three vessel disease. Severe native CAD beyond the insertions of the grafts, which is not amenable to PCI. Elevated troponin likely from small vessel disease.   LV gram shows a Takotsubo variant pattern with akinesis at the base and normal contraction at the apex.   Lab Results  Component Value Date   HGBA1C 6.0 06/24/2016   82 year old who is seen today following a recent hospital discharge and for transitional care management. He presented with chest pain and was treated for a non-ST elevated MI.  Cardiac catheterization revealed severe three-vessel disease which was not amenable to PCI. The patient has done well since discharge except for an episode of exertional chest pain earlier today.  This was mild and self-limited.  He did not take nitroglycerin nor did he take any of his medications earlier in the day. He still lives alone primarily but does have 2 nephews that check in on him from time to time. He uses a walker and is a high fall risk.  He does have a history of paroxysmal atrial fibrillation but not felt to be a candidate for chronic anticoagulation. He is scheduled for cardiology follow-up tomorrow  Past  Medical History:  Diagnosis Date  . Acute alcoholic hepatitis 05/20/2010  . Altered mental status 04/01/2012  . Anemia of other chronic disease 08/16/2007  . Arthritis   . CAD (coronary artery disease)    a. s/p CABG, notes unclear - 1998 or 2003?  Marland Kitchen. Chronic low back pain   . Coagulopathy (HCC)   . Diabetes mellitus (HCC)   . Essential hypertension   . ETOH abuse   . Hallucination 04/01/2012  . History of pneumonia   . History of stroke   . Hyperlipidemia   . Paroxysmal atrial fibrillation (HCC)   . Paroxysmal atrial flutter (HCC)    a. s/p ablation 2007.  Marland Kitchen. PEA (Pulseless electrical activity) (HCC)    a. h/o PEA cardiac arrest 04/2012 (felt r/t PNA, acidosis, hypoxic resp failure in setting of alcohol withdrawal; required tracheostomy)  . Protein calorie malnutrition (HCC)   . Seizures (HCC) 04/01/2012     Social History   Social History  . Marital status: Married    Spouse name: N/A  . Number of children: N/A  . Years of education: N/A   Occupational History  . Not on file.   Social History Main Topics  . Smoking status: Former Smoker    Packs/day: 0.50    Years: 17.00    Types: Cigarettes    Quit date: 04/22/1983  . Smokeless tobacco: Never Used     Comment: 04/01/2012 "quit smoking 20+ yr ago"  . Alcohol use 10.8 oz/week    2 Glasses of wine, 16  Shots of liquor per week     Comment: hx of ETOH abuse; 04/01/2012 "drink ~ 1 pint//wk; vodka; cup of wine/wk" 04/2015 40 oz wine per day  . Drug use: No  . Sexual activity: No   Other Topics Concern  . Not on file   Social History Narrative  . No narrative on file    Past Surgical History:  Procedure Laterality Date  . CATARACT EXTRACTION W/ INTRAOCULAR LENS  IMPLANT, BILATERAL     "years ago" (04/01/2012)  . CORONARY ARTERY BYPASS GRAFT  ~ 2003   CABG X4  . ESOPHAGOGASTRODUODENOSCOPY  04/13/2012   Procedure: ESOPHAGOGASTRODUODENOSCOPY (EGD);  Surgeon: Florencia Reasons, MD;  Location: [facility] ENDOSCOPY;  Service:  Endoscopy;  Laterality: N/A;  push peg  . LEFT HEART CATH AND CORS/GRAFTS ANGIOGRAPHY N/A 11/17/2016   Procedure: Left Heart Cath and Cors/Grafts Angiography;  Surgeon: Corky Crafts, MD;  Location: [facility] INVASIVE CV LAB;  Service: Cardiovascular;  Laterality: N/A;  . PEG PLACEMENT  04/13/2012   Procedure: PERCUTANEOUS ENDOSCOPIC GASTROSTOMY (PEG) PLACEMENT;  Surgeon: Florencia Reasons, MD;  Location: [facility] ENDOSCOPY;  Service: Endoscopy;  Laterality: N/A;    Family History  Problem Relation Age of Onset  . Heart failure Mother   . Aneurysm Sister   . Suicidality Brother   . Heart failure Brother   . Diabetes Sister     No Known Allergies  Current Outpatient Prescriptions on File Prior to Visit  Medication Sig Dispense Refill  . ACCU-CHEK AVIVA PLUS test strip USE TO TEST BLOOD SUGAR ONCE A DAY 100 each 4  . ACCU-CHEK SOFTCLIX LANCETS lancets USE 1 EACH BY OTHER ROUTE DAILY AS NEEDED FOR OTHER. 100 each 0  . acetaminophen (TYLENOL) 325 MG tablet Take 2 tablets (650 mg total) by mouth every 6 (six) hours as needed for mild pain (or Fever >/= 101). 30 tablet 0  . aspirin 81 MG tablet Take 81 mg by mouth daily.    Marland Kitchen atorvastatin (LIPITOR) 20 MG tablet TAKE 1 TABLET EVERY DAY 90 tablet 1  . Blood Glucose Calibration (ACCU-CHEK AVIVA) SOLN     . clopidogrel (PLAVIX) 75 MG tablet Take 1 tablet (75 mg total) by mouth daily. 30 tablet 0  . folic acid (FOLVITE) 1 MG tablet Take 1 tablet (1 mg total) by mouth daily. 30 tablet 0  . Lancet Devices (ADJUSTABLE LANCING DEVICE) MISC     . lisinopril (PRINIVIL,ZESTRIL) 5 MG tablet Take 1 tablet (5 mg total) by mouth daily. 30 tablet 0  . metoprolol tartrate (LOPRESSOR) 50 MG tablet Take 1 tablet (50 mg total) by mouth 2 (two) times daily. 60 tablet 0  . Multiple Vitamin (MULTIVITAMIN) tablet Take 1 tablet by mouth daily. Reported on 11/08/2015    . nitroGLYCERIN (NITROSTAT) 0.4 MG SL tablet Place 1 tablet (0.4 mg total) under the tongue every 5 (five)  minutes as needed for chest pain. Up to 3 doses. 30 tablet 0  . tamsulosin (FLOMAX) 0.4 MG CAPS capsule TAKE 1 CAPSULE (0.4 MG TOTAL) BY MOUTH DAILY. 90 capsule 3  . thiamine 100 MG tablet Take 1 tablet (100 mg total) by mouth daily. 30 tablet 0   No current facility-administered medications on file prior to visit.     Pulse 100   Temp 98 F (36.7 C) (Oral)   Ht 5\' 11"  (1.803 m)   Wt 169 lb 6.4 oz (76.8 kg)   SpO2 97%   BMI 23.63 kg/m     Review  of Systems  Constitutional: Positive for activity change and fatigue. Negative for appetite change, chills and fever.  HENT: Negative for congestion, dental problem, ear pain, hearing loss, sore throat, tinnitus, trouble swallowing and voice change.   Eyes: Negative for pain, discharge and visual disturbance.  Respiratory: Negative for cough, chest tightness, wheezing and stridor.   Cardiovascular: Positive for chest pain. Negative for palpitations and leg swelling.  Gastrointestinal: Negative for abdominal distention, abdominal pain, blood in stool, constipation, diarrhea, nausea and vomiting.  Genitourinary: Negative for difficulty urinating, discharge, flank pain, genital sores, hematuria and urgency.  Musculoskeletal: Positive for gait problem. Negative for arthralgias, back pain, joint swelling, myalgias and neck stiffness.  Skin: Negative for rash.  Neurological: Positive for tremors and weakness. Negative for dizziness, syncope, speech difficulty, numbness and headaches.  Hematological: Negative for adenopathy. Does not bruise/bleed easily.  Psychiatric/Behavioral: Negative for behavioral problems and dysphoric mood. The patient is not nervous/anxious.        Objective:   Physical Exam  Constitutional: He is oriented to person, place, and time. He appears well-developed.  Blood pressure difficult to hear Right arm revealed palpable systolic of 90  HENT:  Head: Normocephalic.  Right Ear: External ear normal.  Left Ear: External  ear normal.  Eyes: Conjunctivae and EOM are normal.  Neck: Normal range of motion.  Cardiovascular: Normal rate and normal heart sounds.   Pulse rate 100  Pulmonary/Chest: Breath sounds normal.  O2 saturation 97  Abdominal: Bowel sounds are normal.  Musculoskeletal: Normal range of motion. He exhibits no edema or tenderness.  Neurological: He is alert and oriented to person, place, and time.  Parkinsonian facies Resting tremor of the right arm greater than left  Psychiatric: He has a normal mood and affect. His behavior is normal.          Assessment & Plan:   Coronary artery disease.  Status post NSTEMI.  Advanced multivessel disease. Episode of angina earlier today.  Compliance with his medication.  Stressed.  Pulse rate elevated at 100 today.  No beta blocker earlier today Hypotensive.  We'll hold low-dose lisinopril until seen by cardiology tomorrow Diabetes mellitus.  Hemoglobin A1c remains in a nondiabetic range.  We'll decrease metformin to once daily only  Noncompliance, history of alcohol abuse  Follow-up next month as scheduled Cardiology follow-up tomorrow as scheduled Compliance with his medications.  Discussed New written directions discussed and dispensed  Rogelia Boga

## 2016-11-25 NOTE — Telephone Encounter (Signed)
Left message on voicemail to call office.  

## 2016-11-25 NOTE — Telephone Encounter (Signed)
Johnny [name] is calling needing a call back as soon as possible.

## 2016-11-25 NOTE — Addendum Note (Signed)
Addended by: Carola RhineKIGOTHO, Abbegayle Denault N on: 11/25/2016 01:13 PM   Modules accepted: Orders

## 2016-11-25 NOTE — Patient Instructions (Signed)
Hold lisinopril  Decrease metformin to once daily with breakfast  Cardiology follow-up as scheduled  Please take all your medications faithfully

## 2016-11-26 ENCOUNTER — Ambulatory Visit (INDEPENDENT_AMBULATORY_CARE_PROVIDER_SITE_OTHER): Payer: Medicare HMO | Admitting: Physician Assistant

## 2016-11-26 ENCOUNTER — Encounter: Payer: Self-pay | Admitting: Physician Assistant

## 2016-11-26 VITALS — BP 136/58 | HR 74 | Ht 71.0 in

## 2016-11-26 DIAGNOSIS — E785 Hyperlipidemia, unspecified: Secondary | ICD-10-CM | POA: Diagnosis not present

## 2016-11-26 DIAGNOSIS — I480 Paroxysmal atrial fibrillation: Secondary | ICD-10-CM

## 2016-11-26 DIAGNOSIS — I25810 Atherosclerosis of coronary artery bypass graft(s) without angina pectoris: Secondary | ICD-10-CM

## 2016-11-26 DIAGNOSIS — I10 Essential (primary) hypertension: Secondary | ICD-10-CM | POA: Diagnosis not present

## 2016-11-26 DIAGNOSIS — I255 Ischemic cardiomyopathy: Secondary | ICD-10-CM

## 2016-11-26 DIAGNOSIS — E119 Type 2 diabetes mellitus without complications: Secondary | ICD-10-CM

## 2016-11-26 NOTE — Progress Notes (Signed)
Cardiology Office Note    Date:  11/27/2016   ID:  Johnny [name], DOB 06/08/1934, MRN [id number]  PCP:  Gordy Savers, MD  Cardiologist:  Dr. Swaziland  Chief Complaint  Patient presents with  . Follow-up    F/U post hospital. Seen for Dr. Swaziland  . Shortness of Breath    History of Present Illness:  Johnny [name] is a 82 y.o. male with PMH of HTN, HLD, DM, prior EtOH abuse quit in 10/2015, CVA, CAD s/p CABG, PAF/aflutter s/p ablation 2009, PEA arrest in 04/2012 in the setting of withdraw and h/o seizure. He was recently admitted on 11/16/2016 with worsening exertional chest pressure. Initial troponin was 1.17. EKG showed right bundle branch block with left anterior fascicular block which is unchanged. His previous echocardiogram obtained on 05/01/2015 showed EF 40%, grade 1 diastalic dysfunction, mild MR. Cardiac catheterization was performed on 11/17/2016 which showed 100% occluded ostial left circumflex, sequential SVG to OM's were patent, 99% ostial LAD followed by 100% mid LAD lesion with patent LIMA to LAD, 75% distal LAD lesion, 99% ostial D1 was patent SVG to diagonal, 100% occluded mid RCA with patent SVG to PDA. EF was 35-45% with mild MR. No culprit lesion was identified, the severe native CAD beyond the insertion of the graft were not amenable to PCI. LV gram showed a considerable variant pattern with akinesis at the base and the normal contraction and apex. Echocardiogram obtained on 11/18/2016 showed EF 40-45%, grade 1 diastolic dysfunction, no evidence of thrombus, mild MR. The noncoronary cusp movement ability of the aortic valve was severely restricted. Plavix was added to his medical regimen given elevated troponin. He presents today for cartilage office visit.  He presents today for cardiology office visit, his lisinopril has been held because his pulses very weak. I did a manual blood pressure check 3 times on both arm and I was unable to get a blood pressure reading. Our blood  pressure machine showed that his blood pressure is in the 130s systolic. He appears to be very weak even though he says this is his baseline. He does not move around very much and only walks with a walker from room to room but never outside. I will hold lisinopril for now. I have instructed the family to continue to give him nitroglycerin on an as needed basis for chest pain and documented how many time he received a nitroglycerin. If he does take nitroglycerin more than 3 times per week, I'll consider adding Imdur. Otherwise recent cardiac catheterizations reassuring and the recent echocardiogram showing his EF is stable when compare to previous echo, he can follow-up in 2-3 month with Dr. Swaziland.   Past Medical History:  Diagnosis Date  . Acute alcoholic hepatitis 05/20/2010  . Altered mental status 04/01/2012  . Anemia of other chronic disease 08/16/2007  . Arthritis   . CAD (coronary artery disease)    a. s/p CABG, notes unclear - 1998 or 2003?  Marland Kitchen Chronic low back pain   . Coagulopathy (HCC)   . Diabetes mellitus (HCC)   . Essential hypertension   . ETOH abuse   . Hallucination 04/01/2012  . History of pneumonia   . History of stroke   . Hyperlipidemia   . Paroxysmal atrial fibrillation (HCC)   . Paroxysmal atrial flutter (HCC)    a. s/p ablation 2007.  Marland Kitchen PEA (Pulseless electrical activity) (HCC)    a. h/o PEA cardiac arrest 04/2012 (felt r/t PNA, acidosis, hypoxic resp  failure in setting of alcohol withdrawal; required tracheostomy)  . Protein calorie malnutrition (HCC)   . Seizures (HCC) 04/01/2012    Past Surgical History:  Procedure Laterality Date  . CATARACT EXTRACTION W/ INTRAOCULAR LENS  IMPLANT, BILATERAL     "years ago" (04/01/2012)  . CORONARY ARTERY BYPASS GRAFT  ~ 2003   CABG X4  . ESOPHAGOGASTRODUODENOSCOPY  04/13/2012   Procedure: ESOPHAGOGASTRODUODENOSCOPY (EGD);  Surgeon: Florencia Reasonsobert V Buccini, MD;  [facility];  Service: Endoscopy;  Laterality: N/A;  push  peg  . LEFT HEART CATH AND CORS/GRAFTS ANGIOGRAPHY N/A 11/17/2016   Procedure: Left Heart Cath and Cors/Grafts Angiography;  Surgeon: Corky CraftsVaranasi, Jayadeep S, MD;  [facility];  Service: Cardiovascular;  Laterality: N/A;  . PEG PLACEMENT  04/13/2012   Procedure: PERCUTANEOUS ENDOSCOPIC GASTROSTOMY (PEG) PLACEMENT;  Surgeon: Florencia Reasonsobert V Buccini, MD;  Location: [facility] ENDOSCOPY;  Service: Endoscopy;  Laterality: N/A;    Current Medications: Outpatient Medications Prior to Visit  Medication Sig Dispense Refill  . ACCU-CHEK AVIVA PLUS test strip USE TO TEST BLOOD SUGAR ONCE A DAY 100 each 4  . ACCU-CHEK SOFTCLIX LANCETS lancets USE 1 EACH BY OTHER ROUTE DAILY AS NEEDED FOR OTHER. 100 each 0  . acetaminophen (TYLENOL) 325 MG tablet Take 2 tablets (650 mg total) by mouth every 6 (six) hours as needed for mild pain (or Fever >/= 101). 30 tablet 0  . aspirin 81 MG tablet Take 81 mg by mouth daily.    Marland Kitchen. atorvastatin (LIPITOR) 20 MG tablet TAKE 1 TABLET EVERY DAY 90 tablet 1  . Blood Glucose Calibration (ACCU-CHEK AVIVA) SOLN     . clopidogrel (PLAVIX) 75 MG tablet Take 1 tablet (75 mg total) by mouth daily. 30 tablet 0  . folic acid (FOLVITE) 1 MG tablet Take 1 tablet (1 mg total) by mouth daily. 30 tablet 0  . Lancet Devices (ADJUSTABLE LANCING DEVICE) MISC     . metFORMIN (GLUCOPHAGE) 500 MG tablet Take 1 tablet (500 mg total) by mouth daily with breakfast.    . metoprolol tartrate (LOPRESSOR) 50 MG tablet Take 1 tablet (50 mg total) by mouth 2 (two) times daily. 60 tablet 0  . Multiple Vitamin (MULTIVITAMIN) tablet Take 1 tablet by mouth daily. Reported on 11/08/2015    . nitroGLYCERIN (NITROSTAT) 0.4 MG SL tablet Place 1 tablet (0.4 mg total) under the tongue every 5 (five) minutes as needed for chest pain. Up to 3 doses. 30 tablet 0  . tamsulosin (FLOMAX) 0.4 MG CAPS capsule TAKE 1 CAPSULE (0.4 MG TOTAL) BY MOUTH DAILY. 90 capsule 3  . thiamine 100 MG tablet Take 1 tablet (100 mg total) by  mouth daily. 30 tablet 0  . lisinopril (PRINIVIL,ZESTRIL) 5 MG tablet Take 1 tablet (5 mg total) by mouth daily. (Patient not taking: Reported on 11/26/2016) 30 tablet 0   No facility-administered medications prior to visit.      Allergies:   Patient has no known allergies.   Social History   Social History  . Marital status: Married    Spouse name: N/A  . Number of children: N/A  . Years of education: N/A   Social History Main Topics  . Smoking status: Former Smoker    Packs/day: 0.50    Years: 17.00    Types: Cigarettes    Quit date: 04/22/1983  . Smokeless tobacco: Never Used     Comment: 04/01/2012 "quit smoking 20+ yr ago"  . Alcohol use 10.8 oz/week    2  Glasses of wine, 16 Shots of liquor per week     Comment: hx of ETOH abuse; 04/01/2012 "drink ~ 1 pint//wk; vodka; cup of wine/wk" 04/2015 40 oz wine per day  . Drug use: No  . Sexual activity: No   Other Topics Concern  . None   Social History Narrative  . None     Family History:  The patient's family history includes Aneurysm in his sister; Diabetes in his sister; Heart failure in his brother and mother; Suicidality in his brother.   ROS:   Please see the history of present illness.    ROS All other systems reviewed and are negative.   PHYSICAL EXAM:   VS:  BP (!) 136/58   Pulse 74   Ht 5\' 11"  (1.803 m)   BMI 23.63 kg/m    GEN: Well nourished, well developed, in no acute distress  HEENT: normal  Neck: no JVD, carotid bruits, or masses Cardiac: RRR; no murmurs, rubs, or gallops,no edema  Respiratory:  clear to auscultation bilaterally, normal work of breathing GI: soft, nontender, nondistended, + BS MS: no deformity or atrophy  Skin: warm and dry, no rash Neuro:  Alert and Oriented x 3, Strength and sensation are intact Psych: euthymic mood, full affect  Wt Readings from Last 3 Encounters:  11/25/16 169 lb 6.4 oz (76.8 kg)  11/18/16 166 lb 4.8 oz (75.4 kg)  06/24/16 168 lb 3.2 oz (76.3 kg)       Studies/Labs Reviewed:   EKG:  EKG is not ordered today.    Recent Labs: 02/28/2016: TSH 4.34 11/16/2016: ALT 10; BUN 19; Creatinine, Ser 1.02; Potassium 4.0; Sodium 138 11/18/2016: Hemoglobin 11.8; Platelets 134   Lipid Panel    Component Value Date/Time   CHOL 136 05/02/2015 0645   TRIG 78 05/02/2015 0645   TRIG 38 04/23/2006 0000   HDL 56 05/02/2015 0645   CHOLHDL 2.4 05/02/2015 0645   VLDL 16 05/02/2015 0645   LDLCALC 64 05/02/2015 0645    Additional studies/ records that were reviewed today include:   Echo 05/01/2015 LV EF: 40%  Study Conclusions  - Left ventricle: The cavity size was normal. Wall thickness was   normal. The estimated ejection fraction was 40%. Difficult study   for wall motion even with Definity contrast. Anteroseptal   hypokinesis. Inferior and inferolateral hypokinesis. Doppler   parameters are consistent with abnormal left ventricular   relaxation (grade 1 diastolic dysfunction). - Aortic valve: There was no stenosis. - Mitral valve: Moderately calcified annulus. Mildly calcified   leaflets . There was mild regurgitation. - Right ventricle: Poorly visualized. - Pulmonary arteries: No complete TR doppler jet so unable to   estimate PA systolic pressure. - Systemic veins: IVC not visualized.  Impressions:  - Technically difficult study with poor acoustic windows. Normal LV   size with EF estimated around 40%. Difficult study for wall   motion even with Definity, but there did appear to be   anteroseptal, inferior, and inferolateral hypokinesis which is   concerning for possible multivessel coronary disease. The RV was   not visualized.   Cath 11/17/2016 Conclusion     Ost Cx to Prox Cx lesion, 100 %stenosed. Sequential SVG to OMs is patent.  Ost LAD to Prox LAD lesion, 99 %stenosed, Mid LAD lesion, 100 %stenosed. LIMA to LAD is patent. Diffuse Dist LAD lesion, 75 %stenosed.  Ost 1st Diag to 1st Diag lesion, 99 %stenosed,  diffusely beyond graft insertion. SVG to diagonal is patent.  Mid RCA lesion, 100 %stenosed. SVG to PDA is patent. RPDA lesion, 60 %stenosed diffusely beyond graft insertion.  LIMA and is anatomically normal.  There is moderate left ventricular systolic dysfunction.  The left ventricular ejection fraction is 35-45% by visual estimate.  There is mild (2+) mitral regurgitation.  There is no aortic valve stenosis.   Severe three vessel disease.  Severe native CAD beyond the insertions of the grafts, which is not amenable to PCI.  Elevated troponin likely from small vessel disease.    LV gram shows a Takotsubo variant pattern with akinesis at the base and normal contraction at the apex      ASSESSMENT:    1. Coronary artery disease involving coronary bypass graft of native heart without angina pectoris   2. PAF (paroxysmal atrial fibrillation) (HCC)   3. Ischemic cardiomyopathy   4. Essential hypertension   5. Hyperlipidemia, unspecified hyperlipidemia type   6. Controlled type 2 diabetes mellitus without complication, without long-term current use of insulin (HCC)      PLAN:  In order of problems listed above:  1. CAD s/p CABG: Recent cardiac catheterization reassuring. No cold or lesion was identified. He was placed on aspirin and Plavix. He denies any further chest discomfort.  2. PAF s/p ablation in 2009: He is maintaining sinus rhythm  3. ICM with EF 40-45%: Echocardiogram obtained on 11/18/2016 showed stable ejection fraction when compared to the previous echocardiogram in 2017.  4. Hypertension: Blood pressure borderline high today at 136/58 by machine. His lisinopril has been held due to unclear blood pressure, we were unable to pick up his blood pressure manually, apparently this is a chronic issue. He appears to be very weak, even notes he says this is his baseline. Given the fact that we don't know what his true blood pressure is, I'm hesitant to control his blood  pressure too tightly.  5. Hyperlipidemia: Continue Lipitor 20 mg daily  6. DM II: Currently on metformin    Medication Adjustments/Labs and Tests Ordered: Current medicines are reviewed at length with the patient today.  Concerns regarding medicines are outlined above.  Medication changes, Labs and Tests ordered today are listed in the Patient Instructions below. Patient Instructions   Medication Instructions:   STOP lisinopril.  If you take more than 3 doses of Nitroglycerin per week, please give [place] a call, as we may need to start you on Imdur.  Labwork:   None  Testing/Procedures:  None  Follow-Up:  2-3 months with Dr. Swaziland   If you need a refill on your cardiac medications before your next appointment, please call your pharmacy.      Ramond Dial, [place]  11/27/2016 11:22 AM    [facility] [address] [place], [place], [place]  [address] Phone: [phone]; Fax: [phone]

## 2016-11-26 NOTE — Patient Instructions (Signed)
  Medication Instructions:   STOP lisinopril.  If you take more than 3 doses of Nitroglycerin per week, please give us a call, as we may need to start you on Imdur.  Labwork:   None  Testing/Procedures:  None  Follow-Up:  2-3 months with Dr. SwazilandJordan   If you need a refill on your cardiac medications before your next appointment, please call your pharmacy.

## 2016-11-27 ENCOUNTER — Encounter: Payer: Self-pay | Admitting: Physician Assistant

## 2016-12-10 ENCOUNTER — Telehealth: Payer: Self-pay | Admitting: Internal Medicine

## 2016-12-10 NOTE — Telephone Encounter (Signed)
Scree w/ [facility] would like you to know pt's house will be fumigated today and orders will resume after that.

## 2016-12-10 NOTE — Telephone Encounter (Signed)
Noted, thank you

## 2016-12-15 ENCOUNTER — Telehealth: Payer: Self-pay | Admitting: Internal Medicine

## 2016-12-15 DIAGNOSIS — E1151 Type 2 diabetes mellitus with diabetic peripheral angiopathy without gangrene: Secondary | ICD-10-CM | POA: Diagnosis not present

## 2016-12-15 DIAGNOSIS — I480 Paroxysmal atrial fibrillation: Secondary | ICD-10-CM | POA: Diagnosis not present

## 2016-12-15 DIAGNOSIS — I214 Non-ST elevation (NSTEMI) myocardial infarction: Secondary | ICD-10-CM | POA: Diagnosis not present

## 2016-12-15 DIAGNOSIS — I2510 Atherosclerotic heart disease of native coronary artery without angina pectoris: Secondary | ICD-10-CM | POA: Diagnosis not present

## 2016-12-15 DIAGNOSIS — I10 Essential (primary) hypertension: Secondary | ICD-10-CM | POA: Diagnosis not present

## 2016-12-15 DIAGNOSIS — I4892 Unspecified atrial flutter: Secondary | ICD-10-CM | POA: Diagnosis not present

## 2016-12-15 NOTE — Telephone Encounter (Signed)
Johnny [name] with [facility] called discharging therapy 2x a wk for 5 wks effective today and is requesting verbal order to add a nurse to evaluate.  [phone]

## 2016-12-16 NOTE — Telephone Encounter (Signed)
Verbal orders given per Dr K  

## 2016-12-17 DIAGNOSIS — I4892 Unspecified atrial flutter: Secondary | ICD-10-CM | POA: Diagnosis not present

## 2016-12-17 DIAGNOSIS — I214 Non-ST elevation (NSTEMI) myocardial infarction: Secondary | ICD-10-CM | POA: Diagnosis not present

## 2016-12-17 DIAGNOSIS — I480 Paroxysmal atrial fibrillation: Secondary | ICD-10-CM | POA: Diagnosis not present

## 2016-12-17 DIAGNOSIS — I2510 Atherosclerotic heart disease of native coronary artery without angina pectoris: Secondary | ICD-10-CM | POA: Diagnosis not present

## 2016-12-17 DIAGNOSIS — I10 Essential (primary) hypertension: Secondary | ICD-10-CM | POA: Diagnosis not present

## 2016-12-17 DIAGNOSIS — E1151 Type 2 diabetes mellitus with diabetic peripheral angiopathy without gangrene: Secondary | ICD-10-CM | POA: Diagnosis not present

## 2016-12-19 DIAGNOSIS — I4892 Unspecified atrial flutter: Secondary | ICD-10-CM | POA: Diagnosis not present

## 2016-12-19 DIAGNOSIS — I214 Non-ST elevation (NSTEMI) myocardial infarction: Secondary | ICD-10-CM | POA: Diagnosis not present

## 2016-12-19 DIAGNOSIS — I10 Essential (primary) hypertension: Secondary | ICD-10-CM | POA: Diagnosis not present

## 2016-12-19 DIAGNOSIS — I480 Paroxysmal atrial fibrillation: Secondary | ICD-10-CM | POA: Diagnosis not present

## 2016-12-19 DIAGNOSIS — I2510 Atherosclerotic heart disease of native coronary artery without angina pectoris: Secondary | ICD-10-CM | POA: Diagnosis not present

## 2016-12-19 DIAGNOSIS — E1151 Type 2 diabetes mellitus with diabetic peripheral angiopathy without gangrene: Secondary | ICD-10-CM | POA: Diagnosis not present

## 2016-12-23 DIAGNOSIS — I10 Essential (primary) hypertension: Secondary | ICD-10-CM | POA: Diagnosis not present

## 2016-12-23 DIAGNOSIS — I214 Non-ST elevation (NSTEMI) myocardial infarction: Secondary | ICD-10-CM | POA: Diagnosis not present

## 2016-12-23 DIAGNOSIS — I480 Paroxysmal atrial fibrillation: Secondary | ICD-10-CM | POA: Diagnosis not present

## 2016-12-23 DIAGNOSIS — I2510 Atherosclerotic heart disease of native coronary artery without angina pectoris: Secondary | ICD-10-CM | POA: Diagnosis not present

## 2016-12-23 DIAGNOSIS — I4892 Unspecified atrial flutter: Secondary | ICD-10-CM | POA: Diagnosis not present

## 2016-12-23 DIAGNOSIS — E1151 Type 2 diabetes mellitus with diabetic peripheral angiopathy without gangrene: Secondary | ICD-10-CM | POA: Diagnosis not present

## 2016-12-25 DIAGNOSIS — I2510 Atherosclerotic heart disease of native coronary artery without angina pectoris: Secondary | ICD-10-CM | POA: Diagnosis not present

## 2016-12-25 DIAGNOSIS — E1151 Type 2 diabetes mellitus with diabetic peripheral angiopathy without gangrene: Secondary | ICD-10-CM | POA: Diagnosis not present

## 2016-12-25 DIAGNOSIS — I214 Non-ST elevation (NSTEMI) myocardial infarction: Secondary | ICD-10-CM | POA: Diagnosis not present

## 2016-12-25 DIAGNOSIS — I10 Essential (primary) hypertension: Secondary | ICD-10-CM | POA: Diagnosis not present

## 2016-12-25 DIAGNOSIS — I480 Paroxysmal atrial fibrillation: Secondary | ICD-10-CM | POA: Diagnosis not present

## 2016-12-25 DIAGNOSIS — I4892 Unspecified atrial flutter: Secondary | ICD-10-CM | POA: Diagnosis not present

## 2016-12-26 ENCOUNTER — Other Ambulatory Visit: Payer: Self-pay | Admitting: Internal Medicine

## 2016-12-26 DIAGNOSIS — I10 Essential (primary) hypertension: Secondary | ICD-10-CM | POA: Diagnosis not present

## 2016-12-26 DIAGNOSIS — I214 Non-ST elevation (NSTEMI) myocardial infarction: Secondary | ICD-10-CM | POA: Diagnosis not present

## 2016-12-26 DIAGNOSIS — E1151 Type 2 diabetes mellitus with diabetic peripheral angiopathy without gangrene: Secondary | ICD-10-CM | POA: Diagnosis not present

## 2016-12-26 DIAGNOSIS — I480 Paroxysmal atrial fibrillation: Secondary | ICD-10-CM | POA: Diagnosis not present

## 2016-12-26 DIAGNOSIS — I2510 Atherosclerotic heart disease of native coronary artery without angina pectoris: Secondary | ICD-10-CM | POA: Diagnosis not present

## 2016-12-26 DIAGNOSIS — I4892 Unspecified atrial flutter: Secondary | ICD-10-CM | POA: Diagnosis not present

## 2016-12-29 DIAGNOSIS — E1151 Type 2 diabetes mellitus with diabetic peripheral angiopathy without gangrene: Secondary | ICD-10-CM | POA: Diagnosis not present

## 2016-12-29 DIAGNOSIS — I10 Essential (primary) hypertension: Secondary | ICD-10-CM | POA: Diagnosis not present

## 2016-12-29 DIAGNOSIS — I4892 Unspecified atrial flutter: Secondary | ICD-10-CM | POA: Diagnosis not present

## 2016-12-29 DIAGNOSIS — I2510 Atherosclerotic heart disease of native coronary artery without angina pectoris: Secondary | ICD-10-CM | POA: Diagnosis not present

## 2016-12-29 DIAGNOSIS — I214 Non-ST elevation (NSTEMI) myocardial infarction: Secondary | ICD-10-CM | POA: Diagnosis not present

## 2016-12-29 DIAGNOSIS — I480 Paroxysmal atrial fibrillation: Secondary | ICD-10-CM | POA: Diagnosis not present

## 2016-12-31 ENCOUNTER — Ambulatory Visit: Payer: Medicare HMO | Admitting: Internal Medicine

## 2016-12-31 DIAGNOSIS — I4892 Unspecified atrial flutter: Secondary | ICD-10-CM | POA: Diagnosis not present

## 2016-12-31 DIAGNOSIS — I2510 Atherosclerotic heart disease of native coronary artery without angina pectoris: Secondary | ICD-10-CM | POA: Diagnosis not present

## 2016-12-31 DIAGNOSIS — I480 Paroxysmal atrial fibrillation: Secondary | ICD-10-CM | POA: Diagnosis not present

## 2016-12-31 DIAGNOSIS — I10 Essential (primary) hypertension: Secondary | ICD-10-CM | POA: Diagnosis not present

## 2016-12-31 DIAGNOSIS — E1151 Type 2 diabetes mellitus with diabetic peripheral angiopathy without gangrene: Secondary | ICD-10-CM | POA: Diagnosis not present

## 2016-12-31 DIAGNOSIS — I214 Non-ST elevation (NSTEMI) myocardial infarction: Secondary | ICD-10-CM | POA: Diagnosis not present

## 2017-01-01 DIAGNOSIS — I214 Non-ST elevation (NSTEMI) myocardial infarction: Secondary | ICD-10-CM | POA: Diagnosis not present

## 2017-01-01 DIAGNOSIS — I2510 Atherosclerotic heart disease of native coronary artery without angina pectoris: Secondary | ICD-10-CM | POA: Diagnosis not present

## 2017-01-01 DIAGNOSIS — I4892 Unspecified atrial flutter: Secondary | ICD-10-CM | POA: Diagnosis not present

## 2017-01-01 DIAGNOSIS — E1151 Type 2 diabetes mellitus with diabetic peripheral angiopathy without gangrene: Secondary | ICD-10-CM | POA: Diagnosis not present

## 2017-01-01 DIAGNOSIS — I480 Paroxysmal atrial fibrillation: Secondary | ICD-10-CM | POA: Diagnosis not present

## 2017-01-01 DIAGNOSIS — I10 Essential (primary) hypertension: Secondary | ICD-10-CM | POA: Diagnosis not present

## 2017-01-05 DIAGNOSIS — I480 Paroxysmal atrial fibrillation: Secondary | ICD-10-CM | POA: Diagnosis not present

## 2017-01-05 DIAGNOSIS — I10 Essential (primary) hypertension: Secondary | ICD-10-CM | POA: Diagnosis not present

## 2017-01-05 DIAGNOSIS — I4892 Unspecified atrial flutter: Secondary | ICD-10-CM | POA: Diagnosis not present

## 2017-01-05 DIAGNOSIS — I214 Non-ST elevation (NSTEMI) myocardial infarction: Secondary | ICD-10-CM | POA: Diagnosis not present

## 2017-01-05 DIAGNOSIS — I2510 Atherosclerotic heart disease of native coronary artery without angina pectoris: Secondary | ICD-10-CM | POA: Diagnosis not present

## 2017-01-05 DIAGNOSIS — E1151 Type 2 diabetes mellitus with diabetic peripheral angiopathy without gangrene: Secondary | ICD-10-CM | POA: Diagnosis not present

## 2017-01-08 DIAGNOSIS — I4892 Unspecified atrial flutter: Secondary | ICD-10-CM | POA: Diagnosis not present

## 2017-01-08 DIAGNOSIS — E1151 Type 2 diabetes mellitus with diabetic peripheral angiopathy without gangrene: Secondary | ICD-10-CM | POA: Diagnosis not present

## 2017-01-08 DIAGNOSIS — I214 Non-ST elevation (NSTEMI) myocardial infarction: Secondary | ICD-10-CM | POA: Diagnosis not present

## 2017-01-08 DIAGNOSIS — I2510 Atherosclerotic heart disease of native coronary artery without angina pectoris: Secondary | ICD-10-CM | POA: Diagnosis not present

## 2017-01-08 DIAGNOSIS — I10 Essential (primary) hypertension: Secondary | ICD-10-CM | POA: Diagnosis not present

## 2017-01-08 DIAGNOSIS — I480 Paroxysmal atrial fibrillation: Secondary | ICD-10-CM | POA: Diagnosis not present

## 2017-01-12 DIAGNOSIS — I4892 Unspecified atrial flutter: Secondary | ICD-10-CM | POA: Diagnosis not present

## 2017-01-12 DIAGNOSIS — E1151 Type 2 diabetes mellitus with diabetic peripheral angiopathy without gangrene: Secondary | ICD-10-CM | POA: Diagnosis not present

## 2017-01-12 DIAGNOSIS — I214 Non-ST elevation (NSTEMI) myocardial infarction: Secondary | ICD-10-CM | POA: Diagnosis not present

## 2017-01-12 DIAGNOSIS — I2510 Atherosclerotic heart disease of native coronary artery without angina pectoris: Secondary | ICD-10-CM | POA: Diagnosis not present

## 2017-01-12 DIAGNOSIS — I480 Paroxysmal atrial fibrillation: Secondary | ICD-10-CM | POA: Diagnosis not present

## 2017-01-12 DIAGNOSIS — I10 Essential (primary) hypertension: Secondary | ICD-10-CM | POA: Diagnosis not present

## 2017-01-14 ENCOUNTER — Ambulatory Visit: Payer: Medicare HMO | Admitting: Internal Medicine

## 2017-01-15 DIAGNOSIS — I4892 Unspecified atrial flutter: Secondary | ICD-10-CM | POA: Diagnosis not present

## 2017-01-15 DIAGNOSIS — I2510 Atherosclerotic heart disease of native coronary artery without angina pectoris: Secondary | ICD-10-CM | POA: Diagnosis not present

## 2017-01-15 DIAGNOSIS — E1151 Type 2 diabetes mellitus with diabetic peripheral angiopathy without gangrene: Secondary | ICD-10-CM | POA: Diagnosis not present

## 2017-01-15 DIAGNOSIS — I10 Essential (primary) hypertension: Secondary | ICD-10-CM | POA: Diagnosis not present

## 2017-01-15 DIAGNOSIS — I214 Non-ST elevation (NSTEMI) myocardial infarction: Secondary | ICD-10-CM | POA: Diagnosis not present

## 2017-01-15 DIAGNOSIS — I480 Paroxysmal atrial fibrillation: Secondary | ICD-10-CM | POA: Diagnosis not present

## 2017-01-20 ENCOUNTER — Encounter: Payer: Self-pay | Admitting: Internal Medicine

## 2017-01-20 ENCOUNTER — Ambulatory Visit (INDEPENDENT_AMBULATORY_CARE_PROVIDER_SITE_OTHER): Payer: Medicare HMO | Admitting: Internal Medicine

## 2017-01-20 VITALS — BP 140/82 | HR 120 | Temp 97.9°F | Ht 71.0 in | Wt 169.2 lb

## 2017-01-20 DIAGNOSIS — E1151 Type 2 diabetes mellitus with diabetic peripheral angiopathy without gangrene: Secondary | ICD-10-CM | POA: Diagnosis not present

## 2017-01-20 DIAGNOSIS — I2510 Atherosclerotic heart disease of native coronary artery without angina pectoris: Secondary | ICD-10-CM | POA: Diagnosis not present

## 2017-01-20 DIAGNOSIS — I10 Essential (primary) hypertension: Secondary | ICD-10-CM | POA: Diagnosis not present

## 2017-01-20 DIAGNOSIS — Z23 Encounter for immunization: Secondary | ICD-10-CM

## 2017-01-20 NOTE — Patient Instructions (Signed)
Cardiology follow-up as scheduled  Limit your sodium (Salt) intake   Please check your hemoglobin A1c every 3 months   Return in 3 months for follow-up

## 2017-01-20 NOTE — Progress Notes (Signed)
Subjective:    Patient ID: Johnny [name], male    DOB: [birth year], 82 y.o.   MRN: [id number]  HPI  Lab Results  Component Value Date   HGBA1C 5.6 11/25/2016   82 year old patient who is seen today for follow-up of type 2 diabetes. Metformin was down titrated 6 weeks ago.  Fasting blood sugar today 111. He has a history of coronary artery disease and hypertension. 2 months ago.  Lisinopril was placed on hold due to low blood pressure readings. Blood pressure today, back up to 150 over 80  He generally feels well, although very sedentary  Past Medical History:  Diagnosis Date  . Acute alcoholic hepatitis 05/20/2010  . Altered mental status 04/01/2012  . Anemia of other chronic disease 08/16/2007  . Arthritis   . CAD (coronary artery disease)    a. s/p CABG, notes unclear - 1998 or 2003?  Marland Kitchen Chronic low back pain   . Coagulopathy (HCC)   . Diabetes mellitus (HCC)   . Essential hypertension   . ETOH abuse   . Hallucination 04/01/2012  . History of pneumonia   . History of stroke   . Hyperlipidemia   . Paroxysmal atrial fibrillation (HCC)   . Paroxysmal atrial flutter (HCC)    a. s/p ablation 2007.  Marland Kitchen PEA (Pulseless electrical activity) (HCC)    a. h/o PEA cardiac arrest 04/2012 (felt r/t PNA, acidosis, hypoxic resp failure in setting of alcohol withdrawal; required tracheostomy)  . Protein calorie malnutrition (HCC)   . Seizures (HCC) 04/01/2012     Social History   Social History  . Marital status: Married    Spouse name: N/A  . Number of children: N/A  . Years of education: N/A   Occupational History  . Not on file.   Social History Main Topics  . Smoking status: Former Smoker    Packs/day: 0.50    Years: 17.00    Types: Cigarettes    Quit date: 04/22/1983  . Smokeless tobacco: Never Used     Comment: 04/01/2012 "quit smoking 20+ yr ago"  . Alcohol use 10.8 oz/week    2 Glasses of wine, 16 Shots of liquor per week     Comment: hx of ETOH abuse; 04/01/2012  "drink ~ 1 pint//wk; vodka; cup of wine/wk" 04/2015 40 oz wine per day  . Drug use: No  . Sexual activity: No   Other Topics Concern  . Not on file   Social History Narrative  . No narrative on file    Past Surgical History:  Procedure Laterality Date  . CATARACT EXTRACTION W/ INTRAOCULAR LENS  IMPLANT, BILATERAL     "years ago" (04/01/2012)  . CORONARY ARTERY BYPASS GRAFT  ~ 2003   CABG X4  . ESOPHAGOGASTRODUODENOSCOPY  04/13/2012   Procedure: ESOPHAGOGASTRODUODENOSCOPY (EGD);  Surgeon: Florencia Reasons, MD;  Location: [facility] ENDOSCOPY;  Service: Endoscopy;  Laterality: N/A;  push peg  . LEFT HEART CATH AND CORS/GRAFTS ANGIOGRAPHY N/A 11/17/2016   Procedure: Left Heart Cath and Cors/Grafts Angiography;  Surgeon: Corky Crafts, MD;  Location: [facility] INVASIVE CV LAB;  Service: Cardiovascular;  Laterality: N/A;  . PEG PLACEMENT  04/13/2012   Procedure: PERCUTANEOUS ENDOSCOPIC GASTROSTOMY (PEG) PLACEMENT;  Surgeon: Florencia Reasons, MD;  Location: [facility] ENDOSCOPY;  Service: Endoscopy;  Laterality: N/A;    Family History  Problem Relation Age of Onset  . Heart failure Mother   . Aneurysm Sister   . Suicidality Brother   . Heart failure Brother   .  Diabetes Sister     No Known Allergies  Current Outpatient Prescriptions on File Prior to Visit  Medication Sig Dispense Refill  . ACCU-CHEK AVIVA PLUS test strip USE TO TEST BLOOD SUGAR ONCE A DAY 100 each 4  . ACCU-CHEK SOFTCLIX LANCETS lancets USE 1 EACH BY OTHER ROUTE DAILY AS NEEDED FOR OTHER. 100 each 0  . acetaminophen (TYLENOL) 325 MG tablet Take 2 tablets (650 mg total) by mouth every 6 (six) hours as needed for mild pain (or Fever >/= 101). 30 tablet 0  . aspirin 81 MG tablet Take 81 mg by mouth daily.    Marland Kitchen atorvastatin (LIPITOR) 20 MG tablet TAKE 1 TABLET EVERY DAY 90 tablet 1  . Blood Glucose Calibration (ACCU-CHEK AVIVA) SOLN     . clopidogrel (PLAVIX) 75 MG tablet TAKE 1 TABLET BY MOUTH EVERY DAY 30 tablet 0  . [facility] B-1 100  MG tablet TAKE 1 TABLET BY MOUTH EVERY DAY 30 tablet 0  . folic acid (FOLVITE) 1 MG tablet TAKE 1 TABLET BY MOUTH EVERY DAY 30 tablet 0  . Lancet Devices (ADJUSTABLE LANCING DEVICE) MISC     . lisinopril (PRINIVIL,ZESTRIL) 5 MG tablet TAKE 1 TABLET BY MOUTH EVERY DAY 30 tablet 0  . metFORMIN (GLUCOPHAGE) 500 MG tablet Take 1 tablet (500 mg total) by mouth daily with breakfast.    . metoprolol tartrate (LOPRESSOR) 50 MG tablet TAKE 1 TABLET BY MOUTH TWICE A DAY 60 tablet 0  . Multiple Vitamin (MULTIVITAMIN) tablet Take 1 tablet by mouth daily. Reported on 11/08/2015    . nitroGLYCERIN (NITROSTAT) 0.4 MG SL tablet Place 1 tablet (0.4 mg total) under the tongue every 5 (five) minutes as needed for chest pain. Up to 3 doses. 30 tablet 0  . tamsulosin (FLOMAX) 0.4 MG CAPS capsule TAKE 1 CAPSULE (0.4 MG TOTAL) BY MOUTH DAILY. 90 capsule 3   No current facility-administered medications on file prior to visit.     BP 140/82 (BP Location: Left Arm, Patient Position: Sitting, Cuff Size: Normal)   Pulse (!) 120   Temp 97.9 F (36.6 C) (Oral)   Ht  (1.803 m)   Wt 169 lb 3.4 oz (76.8 kg)   SpO2 93%   BMI 23.60 kg/m    Review of Systems  Constitutional: Positive for fatigue. Negative for appetite change, chills and fever.  HENT: Negative for congestion, dental problem, ear pain, hearing loss, sore throat, tinnitus, trouble swallowing and voice change.   Eyes: Negative for pain, discharge and visual disturbance.  Respiratory: Negative for cough, chest tightness, wheezing and stridor.   Cardiovascular: Negative for chest pain, palpitations and leg swelling.  Gastrointestinal: Negative for abdominal distention, abdominal pain, blood in stool, constipation, diarrhea, nausea and vomiting.  Genitourinary: Negative for difficulty urinating, discharge, flank pain, genital sores, hematuria and urgency.  Musculoskeletal: Positive for gait problem. Negative for arthralgias, back pain, joint swelling,  myalgias and neck stiffness.  Skin: Negative for rash.  Neurological: Positive for weakness. Negative for dizziness, syncope, speech difficulty, numbness and headaches.  Hematological: Negative for adenopathy. Does not bruise/bleed easily.  Psychiatric/Behavioral: Negative for behavioral problems and dysphoric mood. The patient is not nervous/anxious.        Objective:   Physical Exam  Constitutional: He is oriented to person, place, and time. He appears well-developed.  Appears weak and chronically ill Blood pressure 150/90  HENT:  Head: Normocephalic.  Right Ear: External ear normal.  Left Ear: External ear normal.  Eyes: Conjunctivae and  EOM are normal.  Neck: Normal range of motion.  Cardiovascular: Normal rate and normal heart sounds.   Initial pulse rate 120.  After minimal ambulation   Pulmonary/Chest: Breath sounds normal.  Abdominal: Bowel sounds are normal.  Musculoskeletal: Normal range of motion. He exhibits no edema or tenderness.  Neurological: He is alert and oriented to person, place, and time.  Plus tremor of both hands, right greater than left  Psychiatric: He has a normal mood and affect. His behavior is normal.          Assessment & Plan:   Diabetes mellitus.  Continue once daily, metformin Essential hypertension.  Blood pressure has been a bit labile and slightly high today Patient has cardiology follow-up in a couple of weeks.  Will consider resuming lisinopril at that time, is still elevated Coronary artery disease  Follow-up.  3 months  Rogelia Boga

## 2017-01-23 ENCOUNTER — Other Ambulatory Visit: Payer: Self-pay | Admitting: Internal Medicine

## 2017-01-23 MED ORDER — THIAMINE HCL 100 MG PO TABS: 100.0000 mg | ORAL_TABLET | Freq: Every day | ORAL | 3 refills | Status: AC

## 2017-01-29 ENCOUNTER — Other Ambulatory Visit: Payer: Self-pay | Admitting: Internal Medicine

## 2017-01-29 MED ORDER — FOLIC ACID 1 MG PO TABS: 1.0000 mg | ORAL_TABLET | Freq: Every day | ORAL | 2 refills | Status: AC

## 2017-02-07 NOTE — Progress Notes (Deleted)
Cardiology Office Note    Date:  02/07/2017   ID:  Johnny [name], DOB Nov 03, 1934, MRN [id number]  PCP:  Gordy Savers, MD  Cardiologist:  Dr. Swaziland  No chief complaint on file.   History of Present Illness:  Johnny [name] is a 82 y.o. male with PMH of HTN, HLD, DM, prior EtOH abuse quit in 10/2015, CVA, CAD s/p CABG, PAF/aflutter s/p ablation 2009, PEA arrest in 04/2012 in the setting of withdraw and h/o seizure. He was recently admitted on 11/16/2016 with worsening exertional chest pressure. Initial troponin was 1.17. EKG showed right bundle branch block with left anterior fascicular block which is unchanged. His previous echocardiogram obtained on 05/01/2015 showed EF 40%, grade 1 diastalic dysfunction, mild MR. Cardiac catheterization was performed on 11/17/2016 which showed 100% occluded ostial left circumflex, sequential SVG to OM's were patent, 99% ostial LAD followed by 100% mid LAD lesion with patent LIMA to LAD, 75% distal LAD lesion, 99% ostial D1 was patent SVG to diagonal, 100% occluded mid RCA with patent SVG to PDA. EF was 35-45% with mild MR. No culprit lesion was identified, the severe native CAD beyond the insertion of the graft were not amenable to PCI. LV gram showed a Takotsubo variant pattern with akinesis at the base and  normal contraction at the apex. Echocardiogram obtained on 11/18/2016 showed EF 40-45%, grade 1 diastolic dysfunction, no evidence of thrombus, mild MR. The noncoronary cusp of the aortic valve was severely restricted. Plavix was added to his medical regimen given elevated troponin.   He presents today for cardiology office visit, his lisinopril has been held because his pulses very weak. I did a manual blood pressure check 3 times on both arm and I was unable to get a blood pressure reading. Our blood pressure machine showed that his blood pressure is in the 130s systolic. He appears to be very weak even though he says this is his baseline. He does not move  around very much and only walks with a walker from room to room but never outside. I will hold lisinopril for now. I have instructed the family to continue to give him nitroglycerin on an as needed basis for chest pain and documented how many time he received a nitroglycerin.   Past Medical History:  Diagnosis Date  . Acute alcoholic hepatitis 05/20/2010  . Altered mental status 04/01/2012  . Anemia of other chronic disease 08/16/2007  . Arthritis   . CAD (coronary artery disease)    a. s/p CABG, notes unclear - 1998 or 2003?  Marland Kitchen Chronic low back pain   . Coagulopathy (HCC)   . Diabetes mellitus (HCC)   . Essential hypertension   . ETOH abuse   . Hallucination 04/01/2012  . History of pneumonia   . History of stroke   . Hyperlipidemia   . Paroxysmal atrial fibrillation (HCC)   . Paroxysmal atrial flutter (HCC)    a. s/p ablation 2007.  Marland Kitchen PEA (Pulseless electrical activity) (HCC)    a. h/o PEA cardiac arrest 04/2012 (felt r/t PNA, acidosis, hypoxic resp failure in setting of alcohol withdrawal; required tracheostomy)  . Protein calorie malnutrition (HCC)   . Seizures (HCC) 04/01/2012    Past Surgical History:  Procedure Laterality Date  . CATARACT EXTRACTION W/ INTRAOCULAR LENS  IMPLANT, BILATERAL     "years ago" (04/01/2012)  . CORONARY ARTERY BYPASS GRAFT  ~ 2003   CABG X4  . ESOPHAGOGASTRODUODENOSCOPY  04/13/2012   Procedure: ESOPHAGOGASTRODUODENOSCOPY (EGD);  Surgeon: Florencia Reasonsobert V Buccini, MD;  [facility];  Service: Endoscopy;  Laterality: N/A;  push peg  . LEFT HEART CATH AND CORS/GRAFTS ANGIOGRAPHY N/A 11/17/2016   Procedure: Left Heart Cath and Cors/Grafts Angiography;  Surgeon: Corky CraftsVaranasi, Jayadeep S, MD;  [facility];  Service: Cardiovascular;  Laterality: N/A;  . PEG PLACEMENT  04/13/2012   Procedure: PERCUTANEOUS ENDOSCOPIC GASTROSTOMY (PEG) PLACEMENT;  Surgeon: Florencia Reasonsobert V Buccini, MD;  Location: [facility] ENDOSCOPY;  Service: Endoscopy;  Laterality: N/A;     Current Medications: Outpatient Medications Prior to Visit  Medication Sig Dispense Refill  . ACCU-CHEK AVIVA PLUS test strip USE TO TEST BLOOD SUGAR ONCE A DAY 100 each 4  . ACCU-CHEK SOFTCLIX LANCETS lancets USE 1 EACH BY OTHER ROUTE DAILY AS NEEDED FOR OTHER. 100 each 0  . acetaminophen (TYLENOL) 325 MG tablet Take 2 tablets (650 mg total) by mouth every 6 (six) hours as needed for mild pain (or Fever >/= 101). 30 tablet 0  . aspirin 81 MG tablet Take 81 mg by mouth daily.    Marland Kitchen. atorvastatin (LIPITOR) 20 MG tablet TAKE 1 TABLET EVERY DAY 90 tablet 1  . Blood Glucose Calibration (ACCU-CHEK AVIVA) SOLN     . clopidogrel (PLAVIX) 75 MG tablet TAKE 1 TABLET BY MOUTH EVERY DAY 30 tablet 0  . folic acid (FOLVITE) 1 MG tablet Take 1 tablet (1 mg total) by mouth daily. 90 tablet 2  . Lancet Devices (ADJUSTABLE LANCING DEVICE) MISC     . lisinopril (PRINIVIL,ZESTRIL) 5 MG tablet TAKE 1 TABLET BY MOUTH EVERY DAY 30 tablet 0  . metFORMIN (GLUCOPHAGE) 500 MG tablet Take 1 tablet (500 mg total) by mouth daily with breakfast.    . metoprolol tartrate (LOPRESSOR) 50 MG tablet TAKE 1 TABLET BY MOUTH TWICE A DAY 60 tablet 0  . Multiple Vitamin (MULTIVITAMIN) tablet Take 1 tablet by mouth daily. Reported on 11/08/2015    . nitroGLYCERIN (NITROSTAT) 0.4 MG SL tablet Place 1 tablet (0.4 mg total) under the tongue every 5 (five) minutes as needed for chest pain. Up to 3 doses. 30 tablet 0  . tamsulosin (FLOMAX) 0.4 MG CAPS capsule TAKE 1 CAPSULE (0.4 MG TOTAL) BY MOUTH DAILY. 90 capsule 3  . thiamine ([facility] B-1) 100 MG tablet Take 1 tablet (100 mg total) by mouth daily. 90 tablet 3   No facility-administered medications prior to visit.      Allergies:   Patient has no known allergies.   Social History   Social History  . Marital status: Married    Spouse name: N/A  . Number of children: N/A  . Years of education: N/A   Social History Main Topics  . Smoking status: Former Smoker    Packs/day: 0.50     Years: 17.00    Types: Cigarettes    Quit date: 04/22/1983  . Smokeless tobacco: Never Used     Comment: 04/01/2012 "quit smoking 20+ yr ago"  . Alcohol use 10.8 oz/week    2 Glasses of wine, 16 Shots of liquor per week     Comment: hx of ETOH abuse; 04/01/2012 "drink ~ 1 pint//wk; vodka; cup of wine/wk" 04/2015 40 oz wine per day  . Drug use: No  . Sexual activity: No   Other Topics Concern  . Not on file   Social History Narrative  . No narrative on file     Family History:  The patient's family history includes Aneurysm in his sister; Diabetes in his  sister; Heart failure in his brother and mother; Suicidality in his brother.   ROS:   Please see the history of present illness.    ROS All other systems reviewed and are negative.   PHYSICAL EXAM:   VS:  There were no vitals taken for this visit.   GEN: Well nourished, well developed, in no acute distress  HEENT: normal  Neck: no JVD, carotid bruits, or masses Cardiac: RRR; no murmurs, rubs, or gallops,no edema  Respiratory:  clear to auscultation bilaterally, normal work of breathing GI: soft, nontender, nondistended, + BS MS: no deformity or atrophy  Skin: warm and dry, no rash Neuro:  Alert and Oriented x 3, Strength and sensation are intact Psych: euthymic mood, full affect  Wt Readings from Last 3 Encounters:  01/20/17 169 lb 3.4 oz (76.8 kg)  11/25/16 169 lb 6.4 oz (76.8 kg)  11/18/16 166 lb 4.8 oz (75.4 kg)      Studies/Labs Reviewed:   EKG:  EKG is not ordered today.    Recent Labs: 02/28/2016: TSH 4.34 11/16/2016: ALT 10; BUN 19; Creatinine, Ser 1.02; Potassium 4.0; Sodium 138 11/18/2016: Hemoglobin 11.8; Platelets 134   Lipid Panel    Component Value Date/Time   CHOL 136 05/02/2015 0645   TRIG 78 05/02/2015 0645   TRIG 38 04/23/2006 0000   HDL 56 05/02/2015 0645   CHOLHDL 2.4 05/02/2015 0645   VLDL 16 05/02/2015 0645   LDLCALC 64 05/02/2015 0645    Additional studies/ records that were  reviewed today include:   Echo 05/01/2015 LV EF: 40%  Study Conclusions  - Left ventricle: The cavity size was normal. Wall thickness was   normal. The estimated ejection fraction was 40%. Difficult study   for wall motion even with Definity contrast. Anteroseptal   hypokinesis. Inferior and inferolateral hypokinesis. Doppler   parameters are consistent with abnormal left ventricular   relaxation (grade 1 diastolic dysfunction). - Aortic valve: There was no stenosis. - Mitral valve: Moderately calcified annulus. Mildly calcified   leaflets . There was mild regurgitation. - Right ventricle: Poorly visualized. - Pulmonary arteries: No complete TR doppler jet so unable to   estimate PA systolic pressure. - Systemic veins: IVC not visualized.  Impressions:  - Technically difficult study with poor acoustic windows. Normal LV   size with EF estimated around 40%. Difficult study for wall   motion even with Definity, but there did appear to be   anteroseptal, inferior, and inferolateral hypokinesis which is   concerning for possible multivessel coronary disease. The RV was   not visualized.   Cath 11/17/2016 Conclusion     Ost Cx to Prox Cx lesion, 100 %stenosed. Sequential SVG to OMs is patent.  Ost LAD to Prox LAD lesion, 99 %stenosed, Mid LAD lesion, 100 %stenosed. LIMA to LAD is patent. Diffuse Dist LAD lesion, 75 %stenosed.  Ost 1st Diag to 1st Diag lesion, 99 %stenosed, diffusely beyond graft insertion. SVG to diagonal is patent.  Mid RCA lesion, 100 %stenosed. SVG to PDA is patent. RPDA lesion, 60 %stenosed diffusely beyond graft insertion.  LIMA and is anatomically normal.  There is moderate left ventricular systolic dysfunction.  The left ventricular ejection fraction is 35-45% by visual estimate.  There is mild (2+) mitral regurgitation.  There is no aortic valve stenosis.   Severe three vessel disease.  Severe native CAD beyond the insertions of the grafts,  which is not amenable to PCI.  Elevated troponin likely from small vessel disease.  LV gram shows a Takotsubo variant pattern with akinesis at the base and normal contraction at the apex   Echo 11/18/16: Study Conclusions  - Left ventricle: The cavity size was normal. Wall thickness was   normal. Systolic function was mildly to moderately reduced. The   estimated ejection fraction was in the range of 40% to 45%.   Probable hypokinesis of the inferolateral myocardium. Probable   hypokinesis of the mid-apicalanteroseptal myocardium. Doppler   parameters are consistent with abnormal left ventricular   relaxation (grade 1 diastolic dysfunction). Acoustic contrast   opacification revealed no evidence ofthrombus. - Aortic valve: Noncoronary cusp mobility was severely restricted. - Mitral valve: Calcified annulus. Mildly thickened leaflets .   There was mild regurgitation.   ASSESSMENT:    No diagnosis found.   PLAN:  In order of problems listed above:  1. CAD s/p CABG: Cardiac catheterization in July showed patent grafts.  No culprit lesion was identified. He was placed on aspirin and Plavix. He denies any further chest discomfort.  2. PAF s/p ablation in 2009: He is maintaining sinus rhythm  3. ICM with EF 40-45%: Echocardiogram obtained on 11/18/2016 showed stable ejection fraction when compared to the previous echocardiogram in 2017.  4. Hypertension: Blood pressure borderline high today at 136/58 by machine. His lisinopril has been held due to unclear blood pressure, we were unable to pick up his blood pressure manually, apparently this is a chronic issue. He appears to be very weak, even notes he says this is his baseline. Given the fact that we don't know what his true blood pressure is, I'm hesitant to control his blood pressure too tightly.  5. Hyperlipidemia: Continue Lipitor 20 mg daily  6. DM II: Currently on metformin    Medication Adjustments/Labs and Tests  Ordered: Current medicines are reviewed at length with the patient today.  Concerns regarding medicines are outlined above.  Medication changes, Labs and Tests ordered today are listed in the Patient Instructions below. There are no Patient Instructions on file for this visit.   Signed, Reighlyn Elmes Swaziland, MD  02/07/2017 10:13 AM    [facility] [address] [place], [place], [place]  [address] Phone: [phone]; Fax: [phone]

## 2017-02-12 ENCOUNTER — Ambulatory Visit: Payer: Medicare HMO | Admitting: Cardiology

## 2017-03-15 ENCOUNTER — Other Ambulatory Visit: Payer: Self-pay | Admitting: Internal Medicine

## 2017-03-16 ENCOUNTER — Other Ambulatory Visit: Payer: Self-pay | Admitting: Internal Medicine

## 2017-03-19 ENCOUNTER — Other Ambulatory Visit: Payer: Self-pay | Admitting: Internal Medicine

## 2017-03-19 ENCOUNTER — Telehealth: Payer: Self-pay | Admitting: Internal Medicine

## 2017-03-19 ENCOUNTER — Other Ambulatory Visit: Payer: Self-pay | Admitting: Pharmacy Technician

## 2017-03-19 MED ORDER — METFORMIN HCL 500 MG PO TABS: 500.0000 mg | ORAL_TABLET | Freq: Every day | ORAL | 0 refills | Status: DC

## 2017-03-19 MED ORDER — METFORMIN HCL 500 MG PO TABS: 500.0000 mg | ORAL_TABLET | Freq: Every day | ORAL | Status: DC

## 2017-03-19 NOTE — Patient Outreach (Signed)
[facility] Care Management  03/19/2017  Johnny NewcomerDan E [name] Oct 18, 1934 [id number]  Successful Outreach call to patient. HIPAA identifiers verified. Patient states that he takes Metformin 500mg  twice daily again. Dr. Notes state patient should only be taking 1 tablet daily.  Successful Outreach call to provider office. Spoke to [name] (Triage nurse) whom verified that per Dr. Lesia HausenKwiatowski notes patient should only be taking 1 tablet daily. I informed [name] that I would contact patient again and make sure he understands those directions.  Successful Outreach call to patient, HIPAA identifiers verified. I had patient read me that label on the prescription bottle that is on his bottle. Patient read "Metformin 500mg  HCL, 1 by mouth twice daily with meals". I asked patient if he had remembered talking to Dr. Lesia HausenKwiatowski about his dose going to 1 tablet daily, he verified that he did remember but started taking 2 daily because of the directions on the bottle. Patient could not read the date on the prescription label so I suggested that the patient may be taking tablets from an older bottle. Patient stated he would start taking 1 tablet again.  [facility], I spoke to [name] to verify the directions on the patients bottle from 08/31 fill. [name] stated that the prescription directions are take 1 tablet twice daily. I informed her that I would contact the providers office for new script.  [name], CPhT [facility] Care Management [phone]

## 2017-03-19 NOTE — Telephone Encounter (Signed)
Sp[facility] with  [facility]; clarified Metformin 500 mg 1 tablet daily & new & new [facility].   Pharmacy was called to ensure that any Rx on file for metformin 500 mg BID be deleted from pt's pharmacy chart, informed pharmacy tech that a new Rx was e-scribe with the correct frequency. Pharmacy tech verbalized understanding.

## 2017-03-19 NOTE — Addendum Note (Signed)
Addended by: Neville RouteJAIMES, PATRICIA G on: 03/19/2017 04:56 PM   Modules accepted: Orders

## 2017-03-19 NOTE — Addendum Note (Signed)
Addended by: Neville RouteJAIMES, Vilma Will G on: 03/19/2017 04:51 PM   Modules accepted: Orders

## 2017-03-19 NOTE — Telephone Encounter (Signed)
Sir please clarify patients Metformin dose and frequency.

## 2017-03-19 NOTE — Telephone Encounter (Signed)
[name] from [facility] called to clarify pt's Metformin. Office noted dated 11/25/16 indicated pt dose was changed to 500 mg 1 pill daily

## 2017-03-19 NOTE — Telephone Encounter (Signed)
Metformin 500 mg once daily with breakfast

## 2017-03-19 NOTE — Telephone Encounter (Signed)
Received call back from [place] at [facility]  And notified that a RX for Metformin 1 twice daily had been sent to pharmacy on 03/17/17. Please clarify

## 2017-03-19 NOTE — Patient Outreach (Signed)
[facility] Care Management  03/19/2017  Jarvis NewcomerDan E Asmar 1934-08-05 [id number]   [facility]ful [facility]. Called to verify the last time patient picked up his Metformin 500mg . Crista Elliotnitra verified that patient last picked up a 3 month script 12/19/16.  [name], CPhT [facility] Care Management [phone]

## 2017-03-19 NOTE — Patient Outreach (Signed)
[facility] Care Management  03/19/2017  Jarvis NewcomerDan E [name] [birth year] [id number]   Successful outreach call to [name] at Dr. Currie ParisKwiatowskis office. [facility]ed [facility] Metformin 500mg  be taken once daily by patient. I also informed [name] I had instructed patient to take 1 tablet daily due to verified physician notes. [name] stated she would send a note to reviewed.  [name], CPhT [facility] Care Management [phone]

## 2017-04-23 ENCOUNTER — Ambulatory Visit (INDEPENDENT_AMBULATORY_CARE_PROVIDER_SITE_OTHER): Payer: Medicare HMO | Admitting: Internal Medicine

## 2017-04-23 ENCOUNTER — Encounter: Payer: Self-pay | Admitting: Internal Medicine

## 2017-04-23 VITALS — HR 94

## 2017-04-23 DIAGNOSIS — E119 Type 2 diabetes mellitus without complications: Secondary | ICD-10-CM

## 2017-04-23 LAB — POCT GLYCOSYLATED HEMOGLOBIN (HGB A1C): Hemoglobin A1C: 5.7

## 2017-04-23 NOTE — Patient Instructions (Signed)
Limit your sodium (Salt) intake   Please check your hemoglobin A1c every 3-6  Months  Follow-up cardiology  Continue to hold lisinopril

## 2017-04-23 NOTE — Progress Notes (Signed)
Subjective:    Patient ID: Johnny [name], male    DOB: 09-Nov-1934, 83 y.o.   MRN: [id number]  HPI  83 year old patient who is seen today in follow-up.  He is followed closely by cardiology with coronary artery disease. He has type 2 diabetes which has been well controlled on low-dose metformin therapy.  Lab Results  Component Value Date   HGBA1C 5.6 11/25/2016   Repeat hemoglobin A1c today 5.7  Denies any cardiopulmonary complaints.  Since his last visit here his weight is up 10 pounds.  He states he ate well during the holidays.  Past Medical History:  Diagnosis Date  . Acute alcoholic hepatitis 05/20/2010  . Altered mental status 04/01/2012  . Anemia of other chronic disease 08/16/2007  . Arthritis   . CAD (coronary artery disease)    a. s/p CABG, notes unclear - 1998 or 2003?  Marland Kitchen. Chronic low back pain   . Coagulopathy (HCC)   . Diabetes mellitus (HCC)   . Essential hypertension   . ETOH abuse   . Hallucination 04/01/2012  . History of pneumonia   . History of stroke   . Hyperlipidemia   . Paroxysmal atrial fibrillation (HCC)   . Paroxysmal atrial flutter (HCC)    a. s/p ablation 2007.  Marland Kitchen. PEA (Pulseless electrical activity) (HCC)    a. h/o PEA cardiac arrest 04/2012 (felt r/t PNA, acidosis, hypoxic resp failure in setting of alcohol withdrawal; required tracheostomy)  . Protein calorie malnutrition (HCC)   . Seizures (HCC) 04/01/2012     Social History   Socioeconomic History  . Marital status: Married    Spouse name: Not on file  . Number of children: Not on file  . Years of education: Not on file  . Highest education level: Not on file  Social Needs  . Financial resource strain: Not on file  . Food insecurity - worry: Not on file  . Food insecurity - inability: Not on file  . Transportation needs - medical: Not on file  . Transportation needs - non-medical: Not on file  Occupational History  . Not on file  Tobacco Use  . Smoking status: Former Smoker   Packs/day: 0.50    Years: 17.00    Pack years: 8.50    Types: Cigarettes    Last attempt to quit: 04/22/1983    Years since quitting: 34.0  . Smokeless tobacco: Never Used  . Tobacco comment: 04/01/2012 "quit smoking 20+ yr ago"  Substance and Sexual Activity  . Alcohol use: Yes    Alcohol/week: 10.8 oz    Types: 2 Glasses of wine, 16 Shots of liquor per week    Comment: hx of ETOH abuse; 04/01/2012 "drink ~ 1 pint//wk; vodka; cup of wine/wk" 04/2015 40 oz wine per day  . Drug use: No  . Sexual activity: No  Other Topics Concern  . Not on file  Social History Narrative  . Not on file    Past Surgical History:  Procedure Laterality Date  . CATARACT EXTRACTION W/ INTRAOCULAR LENS  IMPLANT, BILATERAL     "years ago" (04/01/2012)  . CORONARY ARTERY BYPASS GRAFT  ~ 2003   CABG X4  . ESOPHAGOGASTRODUODENOSCOPY  04/13/2012   Procedure: ESOPHAGOGASTRODUODENOSCOPY (EGD);  Surgeon: Florencia Reasonsobert V Buccini, MD;  [facility];  Service: Endoscopy;  Laterality: N/A;  push peg  . LEFT HEART CATH AND CORS/GRAFTS ANGIOGRAPHY N/A 11/17/2016   Procedure: Left Heart Cath and Cors/Grafts Angiography;  Surgeon: Corky CraftsVaranasi, Jayadeep S, MD;  Location: [facility] INVASIVE CV LAB;  Service: Cardiovascular;  Laterality: N/A;  . PEG PLACEMENT  04/13/2012   Procedure: PERCUTANEOUS ENDOSCOPIC GASTROSTOMY (PEG) PLACEMENT;  Surgeon: Florencia Reasons, MD;  Location: [facility] ENDOSCOPY;  Service: Endoscopy;  Laterality: N/A;    Family History  Problem Relation Age of Onset  . Heart failure Mother   . Aneurysm Sister   . Suicidality Brother   . Heart failure Brother   . Diabetes Sister     No Known Allergies  Current Outpatient Medications on File Prior to Visit  Medication Sig Dispense Refill  . ACCU-CHEK AVIVA PLUS test strip USE TO TEST BLOOD SUGAR ONCE A DAY 100 each 4  . ACCU-CHEK SOFTCLIX LANCETS lancets USE 1 EACH BY OTHER ROUTE DAILY AS NEEDED FOR OTHER. 100 each 0  . acetaminophen (TYLENOL) 325 MG tablet  Take 2 tablets (650 mg total) by mouth every 6 (six) hours as needed for mild pain (or Fever >/= 101). 30 tablet 0  . aspirin 81 MG tablet Take 81 mg by mouth daily.    Marland Kitchen atorvastatin (LIPITOR) 20 MG tablet TAKE 1 TABLET BY MOUTH EVERY DAY 90 tablet 1  . Blood Glucose Calibration (ACCU-CHEK AVIVA) SOLN     . folic acid (FOLVITE) 1 MG tablet Take 1 tablet (1 mg total) by mouth daily. 90 tablet 2  . Lancet Devices (ADJUSTABLE LANCING DEVICE) MISC     . lisinopril (PRINIVIL,ZESTRIL) 5 MG tablet TAKE 1 TABLET BY MOUTH EVERY DAY 30 tablet 0  . metFORMIN (GLUCOPHAGE) 500 MG tablet Take 1 tablet (500 mg total) by mouth daily with breakfast. 90 tablet 0  . metoprolol tartrate (LOPRESSOR) 50 MG tablet TAKE 1 TABLET BY MOUTH TWICE A DAY 60 tablet 0  . Multiple Vitamin (MULTIVITAMIN) tablet Take 1 tablet by mouth daily. Reported on 11/08/2015    . nitroGLYCERIN (NITROSTAT) 0.4 MG SL tablet Place 1 tablet (0.4 mg total) under the tongue every 5 (five) minutes as needed for chest pain. Up to 3 doses. 30 tablet 0  . clopidogrel (PLAVIX) 75 MG tablet TAKE 1 TABLET BY MOUTH EVERY DAY (Patient not taking: Reported on 04/23/2017) 30 tablet 0  . tamsulosin (FLOMAX) 0.4 MG CAPS capsule TAKE 1 CAPSULE (0.4 MG TOTAL) BY MOUTH DAILY. 90 capsule 3  . thiamine (CVS B-1) 100 MG tablet Take 1 tablet (100 mg total) by mouth daily. 90 tablet 3   No current facility-administered medications on file prior to visit.     Pulse 94   SpO2 96%      Review of Systems  Constitutional: Negative for appetite change, chills, fatigue and fever.  HENT: Negative for congestion, dental problem, ear pain, hearing loss, sore throat, tinnitus, trouble swallowing and voice change.   Eyes: Negative for pain, discharge and visual disturbance.  Respiratory: Negative for cough, chest tightness, wheezing and stridor.   Cardiovascular: Negative for chest pain, palpitations and leg swelling.  Gastrointestinal: Negative for abdominal distention,  abdominal pain, blood in stool, constipation, diarrhea, nausea and vomiting.  Genitourinary: Negative for difficulty urinating, discharge, flank pain, genital sores, hematuria and urgency.  Musculoskeletal: Negative for arthralgias, back pain, gait problem, joint swelling, myalgias and neck stiffness.  Skin: Negative for rash.  Neurological: Positive for tremors and weakness. Negative for dizziness, syncope, speech difficulty, numbness and headaches.  Hematological: Negative for adenopathy. Does not bruise/bleed easily.  Psychiatric/Behavioral: Negative for behavioral problems and dysphoric mood. The patient is not nervous/anxious.        Objective:  Physical Exam  Constitutional: He is oriented to person, place, and time. He appears well-developed.  Blood pressure 110/70 and very faint  HENT:  Head: Normocephalic.  Right Ear: External ear normal.  Left Ear: External ear normal.  Eyes: Conjunctivae and EOM are normal.  Neck: Normal range of motion.  Cardiovascular: Normal rate and normal heart sounds.  Pulmonary/Chest: Breath sounds normal.  Abdominal: Bowel sounds are normal.  Musculoskeletal: Normal range of motion. He exhibits no edema or tenderness.  Neurological: He is alert and oriented to person, place, and time.  Psychiatric: He has a normal mood and affect. His behavior is normal.          Assessment & Plan:   Diabetes mellitus..  Very well controlled.  Will continue submaximal dose metformin Essential hypertension stable  Coronary artery disease stable.  Follow cardiology  Return here 6 months No change in medical regimen  Rogelia Boga

## 2017-04-30 NOTE — Progress Notes (Signed)
Cardiology Office Note    Date:  05/05/2017   ID:  Johnny [name], DOB 20-Apr-1935, MRN [id number]  PCP:  Gordy SaversKwiatkowski, Briggs Edelen F, MD  Cardiologist:  Dr. SwazilandJordan  Chief Complaint  Patient presents with  . Coronary Artery Disease  . Congestive Heart Failure    History of Present Illness:  Johnny [name] is a 83 y.o. male with PMH of HTN, HLD, DM, prior EtOH abuse quit in 10/2015, CVA, CAD s/p CABG, PAF/aflutter s/p ablation 2009, PEA arrest in 04/2012 in the setting of withdrawal seizure. He was  admitted on 11/16/2016 with worsening exertional chest pressure. Initial troponin was 1.17. EKG showed right bundle branch block with left anterior fascicular block which is unchanged. His previous echocardiogram obtained on 05/01/2015 showed EF 40%, grade 1 diastalic dysfunction, mild MR. Cardiac catheterization was performed on 11/17/2016 which showed 100% occluded ostial left circumflex, sequential SVG to OM's were patent, 99% ostial LAD followed by 100% mid LAD lesion with patent LIMA to LAD, 75% distal LAD lesion, 99% ostial D1 was patent SVG to diagonal, 100% occluded mid RCA with patent SVG to PDA. EF was 35-45% with mild MR. No culprit lesion was identified, the severe native CAD beyond the insertion of the graft were not amenable to PCI. LV gram showed a considerable variant pattern with akinesis at the base and the normal contraction and apex. Echocardiogram obtained on 11/18/2016 showed EF 40-45%, grade 1 diastolic dysfunction, no evidence of thrombus, mild MR. The noncoronary cusp movement ability of the aortic valve was severely restricted. Plavix was added to his medical regimen given elevated troponin.   On follow up today he is doing OK. He does note some dyspnea when walking in his house. His activity is very limited. He denies any chest pain and has not used Ntg. No palpitations, dizziness, claudication. No cough or edema.   Past Medical History:  Diagnosis Date  . Acute alcoholic hepatitis 05/20/2010    . Altered mental status 04/01/2012  . Anemia of other chronic disease 08/16/2007  . Arthritis   . CAD (coronary artery disease)    a. s/p CABG, notes unclear - 1998 or 2003?  Marland Kitchen. Chronic low back pain   . Coagulopathy (HCC)   . Diabetes mellitus (HCC)   . Essential hypertension   . ETOH abuse   . Hallucination 04/01/2012  . History of pneumonia   . History of stroke   . Hyperlipidemia   . Paroxysmal atrial fibrillation (HCC)   . Paroxysmal atrial flutter (HCC)    a. s/p ablation 2007.  Marland Kitchen. PEA (Pulseless electrical activity) (HCC)    a. h/o PEA cardiac arrest 04/2012 (felt r/t PNA, acidosis, hypoxic resp failure in setting of alcohol withdrawal; required tracheostomy)  . Protein calorie malnutrition (HCC)   . Seizures (HCC) 04/01/2012    Past Surgical History:  Procedure Laterality Date  . CATARACT EXTRACTION W/ INTRAOCULAR LENS  IMPLANT, BILATERAL     "years ago" (04/01/2012)  . CORONARY ARTERY BYPASS GRAFT  ~ 2003   CABG X4  . ESOPHAGOGASTRODUODENOSCOPY  04/13/2012   Procedure: ESOPHAGOGASTRODUODENOSCOPY (EGD);  Surgeon: Florencia Reasonsobert V Buccini, MD;  [facility];  Service: Endoscopy;  Laterality: N/A;  push peg  . LEFT HEART CATH AND CORS/GRAFTS ANGIOGRAPHY N/A 11/17/2016   Procedure: Left Heart Cath and Cors/Grafts Angiography;  Surgeon: Corky CraftsVaranasi, Jayadeep S, MD;  [facility];  Service: Cardiovascular;  Laterality: N/A;  . PEG PLACEMENT  04/13/2012   Procedure: PERCUTANEOUS ENDOSCOPIC GASTROSTOMY (  PEG) PLACEMENT;  Surgeon: Florencia Reasons, MD;  Location: [facility] ENDOSCOPY;  Service: Endoscopy;  Laterality: N/A;    Current Medications: Outpatient Medications Prior to Visit  Medication Sig Dispense Refill  . ACCU-CHEK AVIVA PLUS test strip USE TO TEST BLOOD SUGAR ONCE A DAY 100 each 4  . ACCU-CHEK SOFTCLIX LANCETS lancets USE 1 EACH BY OTHER ROUTE DAILY AS NEEDED FOR OTHER. 100 each 0  . acetaminophen (TYLENOL) 325 MG tablet Take 2 tablets (650 mg total) by  mouth every 6 (six) hours as needed for mild pain (or Fever >/= 101). 30 tablet 0  . aspirin 81 MG tablet Take 81 mg by mouth daily.    Marland Kitchen atorvastatin (LIPITOR) 20 MG tablet TAKE 1 TABLET BY MOUTH EVERY DAY 90 tablet 1  . Blood Glucose Calibration (ACCU-CHEK AVIVA) SOLN     . clopidogrel (PLAVIX) 75 MG tablet TAKE 1 TABLET BY MOUTH EVERY DAY 30 tablet 0  . folic acid (FOLVITE) 1 MG tablet Take 1 tablet (1 mg total) by mouth daily. 90 tablet 2  . Lancet Devices (ADJUSTABLE LANCING DEVICE) MISC     . metFORMIN (GLUCOPHAGE) 500 MG tablet Take 1 tablet (500 mg total) by mouth daily with breakfast. 90 tablet 0  . metoprolol tartrate (LOPRESSOR) 50 MG tablet TAKE 1 TABLET BY MOUTH TWICE A DAY 60 tablet 0  . Multiple Vitamin (MULTIVITAMIN) tablet Take 1 tablet by mouth daily. Reported on 11/08/2015    . nitroGLYCERIN (NITROSTAT) 0.4 MG SL tablet Place 1 tablet (0.4 mg total) under the tongue every 5 (five) minutes as needed for chest pain. Up to 3 doses. 30 tablet 0  . tamsulosin (FLOMAX) 0.4 MG CAPS capsule TAKE 1 CAPSULE (0.4 MG TOTAL) BY MOUTH DAILY. 90 capsule 3  . thiamine (CVS B-1) 100 MG tablet Take 1 tablet (100 mg total) by mouth daily. 90 tablet 3  . lisinopril (PRINIVIL,ZESTRIL) 5 MG tablet TAKE 1 TABLET BY MOUTH EVERY DAY 30 tablet 0   No facility-administered medications prior to visit.      Allergies:   Patient has no known allergies.   Social History   Socioeconomic History  . Marital status: Married    Spouse name: None  . Number of children: None  . Years of education: None  . Highest education level: None  Social Needs  . Financial resource strain: None  . Food insecurity - worry: None  . Food insecurity - inability: None  . Transportation needs - medical: None  . Transportation needs - non-medical: None  Occupational History  . None  Tobacco Use  . Smoking status: Former Smoker    Packs/day: 0.50    Years: 17.00    Pack years: 8.50    Types: Cigarettes    Last  attempt to quit: 04/22/1983    Years since quitting: 34.0  . Smokeless tobacco: Never Used  . Tobacco comment: 04/01/2012 "quit smoking 20+ yr ago"  Substance and Sexual Activity  . Alcohol use: Yes    Alcohol/week: 10.8 oz    Types: 2 Glasses of wine, 16 Shots of liquor per week    Comment: hx of ETOH abuse; 04/01/2012 "drink ~ 1 pint//wk; vodka; cup of wine/wk" 04/2015 40 oz wine per day  . Drug use: No  . Sexual activity: No  Other Topics Concern  . None  Social History Narrative  . None     Family History:  The patient's family history includes Aneurysm in his sister; Diabetes in his sister;  Heart failure in his brother and mother; Suicidality in his brother.   ROS:   Please see the history of present illness.    ROS All other systems reviewed and are negative.   PHYSICAL EXAM:   VS:  Pulse 72   Ht 5' 10.5" (1.791 m)   Wt 178 lb 9.6 oz (81 kg)   SpO2 100%   BMI 25.26 kg/m    GENERAL:  Well appearing BM seen in a wheelchair HEENT:  PERRL, EOMI, sclera are clear. Oropharynx is clear. NECK:  No jugular venous distention, carotid upstroke brisk and symmetric, no bruits, no thyromegaly or adenopathy LUNGS:  Clear to auscultation bilaterally CHEST:  Unremarkable HEART:  RRR,  PMI not displaced or sustained,S1 and S2 within normal limits, no S3, no S4: no clicks, no rubs, no murmurs ABD:  Soft, nontender. BS +, no masses or bruits. No hepatomegaly, no splenomegaly EXT:  2 + pulses throughout, no edema, no cyanosis no clubbing SKIN:  Warm and dry.  No rashes NEURO:  Alert and oriented x 3.  PSYCH:  Cognitively intact    Wt Readings from Last 3 Encounters:  05/05/17 178 lb 9.6 oz (81 kg)  01/20/17 169 lb 3.4 oz (76.8 kg)  11/25/16 169 lb 6.4 oz (76.8 kg)      Studies/Labs Reviewed:   EKG:  EKG is not ordered today.    Recent Labs: 11/16/2016: ALT 10; BUN 19; Creatinine, Ser 1.02; Potassium 4.0; Sodium 138 11/18/2016: Hemoglobin 11.8; Platelets 134   Lipid Panel     Component Value Date/Time   CHOL 136 05/02/2015 0645   TRIG 78 05/02/2015 0645   TRIG 38 04/23/2006 0000   HDL 56 05/02/2015 0645   CHOLHDL 2.4 05/02/2015 0645   VLDL 16 05/02/2015 0645   LDLCALC 64 05/02/2015 0645   Labs 04/23/17: A1c 5.7%  Additional studies/ records that were reviewed today include:   Echo 05/01/2015 LV EF: 40%  Study Conclusions  - Left ventricle: The cavity size was normal. Wall thickness was   normal. The estimated ejection fraction was 40%. Difficult study   for wall motion even with Definity contrast. Anteroseptal   hypokinesis. Inferior and inferolateral hypokinesis. Doppler   parameters are consistent with abnormal left ventricular   relaxation (grade 1 diastolic dysfunction). - Aortic valve: There was no stenosis. - Mitral valve: Moderately calcified annulus. Mildly calcified   leaflets . There was mild regurgitation. - Right ventricle: Poorly visualized. - Pulmonary arteries: No complete TR doppler jet so unable to   estimate PA systolic pressure. - Systemic veins: IVC not visualized.  Impressions:  - Technically difficult study with poor acoustic windows. Normal LV   size with EF estimated around 40%. Difficult study for wall   motion even with Definity, but there did appear to be   anteroseptal, inferior, and inferolateral hypokinesis which is   concerning for possible multivessel coronary disease. The RV was   not visualized.   Cath 11/17/2016 Conclusion     Ost Cx to Prox Cx lesion, 100 %stenosed. Sequential SVG to OMs is patent.  Ost LAD to Prox LAD lesion, 99 %stenosed, Mid LAD lesion, 100 %stenosed. LIMA to LAD is patent. Diffuse Dist LAD lesion, 75 %stenosed.  Ost 1st Diag to 1st Diag lesion, 99 %stenosed, diffusely beyond graft insertion. SVG to diagonal is patent.  Mid RCA lesion, 100 %stenosed. SVG to PDA is patent. RPDA lesion, 60 %stenosed diffusely beyond graft insertion.  LIMA and is anatomically normal.  There is  moderate left ventricular systolic dysfunction.  The left ventricular ejection fraction is 35-45% by visual estimate.  There is mild (2+) mitral regurgitation.  There is no aortic valve stenosis.   Severe three vessel disease.  Severe native CAD beyond the insertions of the grafts, which is not amenable to PCI.  Elevated troponin likely from small vessel disease.    LV gram shows a Takotsubo variant pattern with akinesis at the base and normal contraction at the apex      ASSESSMENT:    1. Coronary artery disease involving coronary bypass graft of native heart without angina pectoris   2. Ischemic cardiomyopathy   3. Essential hypertension   4. Hyperlipidemia, unspecified hyperlipidemia type      PLAN:  In order of problems listed above:  1. CAD s/p CABG:  cardiac catheterization in July 2018 reassuring.  Graft patent. He is  on aspirin and Plavix. He is asymptomatic.  2. PAF s/p ablation in 2009: He is maintaining sinus rhythm  3. ICM with EF 40-45%: Echocardiogram obtained on 11/18/2016 showed stable ejection fraction when compared to the previous echocardiogram in 2017. Was considered for lisinopril but inability to accurately measure BP concerning. Will not add this now.   4. Hypertension: Blood pressure cannot be obtained. Very weak pulses of unclear etiology. Peripheral perfusion is good. No aortic or major vessel abnormality on chest CT in 2017. BP normal during invasive cath.  5. Hyperlipidemia: Continue Lipitor 20 mg daily. Will check fasting labs today.  6. DM II: Currently on metformin. A1c 5.7%    Medication Adjustments/Labs and Tests Ordered: Current medicines are reviewed at length with the patient today.  Concerns regarding medicines are outlined above.  Medication changes, Labs and Tests ordered today are listed in the Patient Instructions below. Patient Instructions  Continue your current therapy  We will check blood work today  Follow up in 6 months     Signed, Juliane Guest [place], MD  05/05/2017 9:59 AM    [facility] [address] [place], [place], [place]  [address] Phone: [phone]; Fax: [phone]

## 2017-05-05 ENCOUNTER — Ambulatory Visit: Payer: Medicare HMO | Admitting: Cardiology

## 2017-05-05 ENCOUNTER — Encounter: Payer: Self-pay | Admitting: Cardiology

## 2017-05-05 VITALS — HR 72 | Ht 70.5 in | Wt 178.6 lb

## 2017-05-05 DIAGNOSIS — I255 Ischemic cardiomyopathy: Secondary | ICD-10-CM | POA: Diagnosis not present

## 2017-05-05 DIAGNOSIS — E785 Hyperlipidemia, unspecified: Secondary | ICD-10-CM | POA: Diagnosis not present

## 2017-05-05 DIAGNOSIS — I10 Essential (primary) hypertension: Secondary | ICD-10-CM

## 2017-05-05 DIAGNOSIS — I25810 Atherosclerosis of coronary artery bypass graft(s) without angina pectoris: Secondary | ICD-10-CM | POA: Diagnosis not present

## 2017-05-05 LAB — BASIC METABOLIC PANEL
BUN/Creatinine Ratio: 21 (ref 10–24)
BUN: 23 mg/dL (ref 8–27)
CALCIUM: 9.1 mg/dL (ref 8.6–10.2)
CO2: 23 mmol/L (ref 20–29)
Chloride: 107 mmol/L — ABNORMAL HIGH (ref 96–106)
Creatinine, Ser: 1.08 mg/dL (ref 0.76–1.27)
GFR calc Af Amer: 75 mL/min/{1.73_m2} (ref >59)
GFR, EST NON AFRICAN AMERICAN: 64 mL/min/{1.73_m2} (ref >59)
Glucose: 158 mg/dL — ABNORMAL HIGH (ref 65–99)
Potassium: 4.7 mmol/L (ref 3.5–5.2)
Sodium: 146 mmol/L — ABNORMAL HIGH (ref 134–144)

## 2017-05-05 LAB — HEPATIC FUNCTION PANEL
ALT: 9 IU/L (ref 0–44)
AST: 12 IU/L (ref 0–40)
Albumin: 3.7 g/dL (ref 3.5–4.7)
Alkaline Phosphatase: 107 IU/L (ref 39–117)
Bilirubin Total: 0.4 mg/dL (ref 0.0–1.2)
Bilirubin, Direct: 0.15 mg/dL (ref 0.00–0.40)
TOTAL PROTEIN: 6.8 g/dL (ref 6.0–8.5)

## 2017-05-05 LAB — LIPID PANEL W/O CHOL/HDL RATIO
Cholesterol, Total: 97 mg/dL — ABNORMAL LOW (ref 100–199)
HDL: 47 mg/dL (ref >39)
LDL Calculated: 40 mg/dL (ref 0–99)
TRIGLYCERIDES: 51 mg/dL (ref 0–149)
VLDL CHOLESTEROL CAL: 10 mg/dL (ref 5–40)

## 2017-05-05 NOTE — Patient Instructions (Signed)
Continue your current therapy  We will check blood work today  Follow up in 6 months    

## 2017-05-12 ENCOUNTER — Other Ambulatory Visit: Payer: Self-pay | Admitting: Internal Medicine

## 2017-06-22 ENCOUNTER — Other Ambulatory Visit: Payer: Self-pay | Admitting: Internal Medicine

## 2017-07-02 ENCOUNTER — Ambulatory Visit: Payer: Self-pay | Admitting: *Deleted

## 2017-07-02 NOTE — Telephone Encounter (Signed)
Pt  Denies  Any  Chest  Pain  At this  Time  He  Reports  On  Exertion  He  Has  Chest  Pain that is  relieved  By  NTG . He   Denies  Any   Shortness  Of  Breath  At this  Time.  He  States  He  Is  Running low on his  NTG  But  Has  Some  Left .  No  avilability  With Dr  Lesia Hausen ; Appt  Offered  For  Today    With Dr  Salomon Fick ; Pt  Declined .Appt  Made  For  tomorrow  With  Dr  Salomon Fick. Appt  Scheduled  By  Nettie Elm   At the  Office . Pt  Advised  To  Call  911   If  Symptoms  Worse   Or  Any  Shortness of  Breath .  Pt  States  He  Understands  Plan of  Care.    Reason for Disposition . [1] Chest pain lasting <= 5 minutes AND [2] completely relieved by nitroglycerin  Answer Assessment - Initial Assessment Questions 1. LOCATION: "Where does it hurt?"        Middle  Of  Chest    2. RADIATION: "Does the pain go anywhere else?" (e.g., into neck, jaw, arms, back)      Radiates  To  l  Side    3. ONSET: "When did the chest pain begin?" (Minutes, hours or days)         1  Week  Ago   4. PATTERN "Does the pain come and go, or has it been constant since it started?"  "Does it get worse with exertion?"           Gets   Worse  On  Exertion    5. DURATION: "How long does it last" (e.g., seconds, minutes, hours)       10  mins    6. SEVERITY: "How bad is the pain?"  (e.g., Scale 1-10; mild, moderate, or severe)    - MILD (1-3): doesn't interfere with normal activities     - MODERATE (4-7): interferes with normal activities or awakens from sleep    - SEVERE (8-10): excruciating pain, unable to do any normal activities         When  It  Occurs  It is  A  5 or  6    7. CARDIAC RISK FACTORS: "Do you have any history of heart problems or risk factors for heart disease?" (e.g., prior heart attack, angina; high blood pressure, diabetes, being overweight, high cholesterol, smoking, or strong family history of heart disease)       Bypass    -   High  Blood  Pressure   High  Cholesterol   Diabetic    8. PULMONARY  RISK FACTORS: "Do you have any history of lung disease?"  (e.g., blood clots in lung, asthma, emphysema, birth control pills)        No  9. CAUSE: "What do you think is causing the chest pain?"         Exertion   10. OTHER SYMPTOMS: "Do you have any other symptoms?" (e.g., dizziness, nausea, vomiting, sweating, fever, difficulty breathing, cough)       no 11. PREGNANCY: "Is there any chance you are pregnant?" "When was your last menstrual period?"        n/a  Protocols used: CHEST PAIN-A-AH

## 2017-07-03 ENCOUNTER — Ambulatory Visit: Payer: Medicare HMO | Admitting: Family Medicine

## 2017-07-16 ENCOUNTER — Telehealth: Payer: Self-pay | Admitting: Internal Medicine

## 2017-07-16 NOTE — Telephone Encounter (Unsigned)
Copied from CRM [phone]#[id number]. Topic: Quick Communication - See Telephone Encounter >> Jul 16, 2017 10:22 AM Floria RavelingStovall, Shana A wrote: CRM for notification. See Telephone encounter for: 07/16/17.[facility] called in and stated they faxed over an order for a back and knee support.  She said that (Angie) told her that it was ok to fax over because pt had an appt in feb.  Does the office have this fax and can it be returned without an appt?    Best number  [phone] Fax [phone]

## 2017-07-16 NOTE — Telephone Encounter (Signed)
Spoke to BelfieldAngie and she stated that the fax was sent Monday. I informed her that the provider will not be in until Monday. She said that she will noted that. No further information needed at the moment.

## 2017-07-21 NOTE — Telephone Encounter (Signed)
Notify Angie that I do not prescribe these items and not familiar with their use or efficacy

## 2017-07-22 NOTE — Telephone Encounter (Signed)
Spoke to [employer] supervisor and updated her. She stated that  The patient would be upset. I informed her that I would talk to the patient.

## 2017-07-22 NOTE — Telephone Encounter (Signed)
Called patient and left message to return call

## 2017-09-09 ENCOUNTER — Other Ambulatory Visit: Payer: Self-pay | Admitting: Internal Medicine

## 2017-09-14 ENCOUNTER — Other Ambulatory Visit: Payer: Self-pay | Admitting: Internal Medicine

## 2017-09-15 NOTE — Telephone Encounter (Signed)
Patient has metoprolol 50 mg BID and metoprolol 75 mg BID on med list. Please advise which dose is correct.

## 2017-09-15 NOTE — Telephone Encounter (Signed)
Okay for refill?  

## 2017-09-18 ENCOUNTER — Other Ambulatory Visit: Payer: Self-pay

## 2017-09-18 MED ORDER — ATORVASTATIN CALCIUM 20 MG PO TABS: 20.0000 mg | ORAL_TABLET | Freq: Every day | ORAL | 1 refills | Status: AC

## 2017-09-23 ENCOUNTER — Inpatient Hospital Stay (HOSPITAL_COMMUNITY)
Admission: EM | Admit: 2017-09-23 | Discharge: 2017-11-19 | DRG: 004 | Disposition: E | Payer: Medicare HMO | Attending: Internal Medicine | Admitting: Internal Medicine

## 2017-09-23 ENCOUNTER — Emergency Department (HOSPITAL_COMMUNITY): Payer: Medicare HMO

## 2017-09-23 ENCOUNTER — Other Ambulatory Visit: Payer: Self-pay | Admitting: Internal Medicine

## 2017-09-23 ENCOUNTER — Inpatient Hospital Stay (HOSPITAL_COMMUNITY): Payer: Medicare HMO

## 2017-09-23 ENCOUNTER — Encounter (HOSPITAL_COMMUNITY): Payer: Self-pay | Admitting: Emergency Medicine

## 2017-09-23 ENCOUNTER — Other Ambulatory Visit: Payer: Self-pay

## 2017-09-23 DIAGNOSIS — N179 Acute kidney failure, unspecified: Secondary | ICD-10-CM

## 2017-09-23 DIAGNOSIS — K5900 Constipation, unspecified: Secondary | ICD-10-CM | POA: Diagnosis not present

## 2017-09-23 DIAGNOSIS — I509 Heart failure, unspecified: Secondary | ICD-10-CM | POA: Diagnosis not present

## 2017-09-23 DIAGNOSIS — Z79899 Other long term (current) drug therapy: Secondary | ICD-10-CM

## 2017-09-23 DIAGNOSIS — N170 Acute kidney failure with tubular necrosis: Secondary | ICD-10-CM | POA: Diagnosis not present

## 2017-09-23 DIAGNOSIS — E875 Hyperkalemia: Secondary | ICD-10-CM | POA: Diagnosis present

## 2017-09-23 DIAGNOSIS — I4892 Unspecified atrial flutter: Secondary | ICD-10-CM | POA: Diagnosis present

## 2017-09-23 DIAGNOSIS — Z7902 Long term (current) use of antithrombotics/antiplatelets: Secondary | ICD-10-CM

## 2017-09-23 DIAGNOSIS — Y92230 Patient room in hospital as the place of occurrence of the external cause: Secondary | ICD-10-CM | POA: Diagnosis not present

## 2017-09-23 DIAGNOSIS — Z961 Presence of intraocular lens: Secondary | ICD-10-CM | POA: Diagnosis present

## 2017-09-23 DIAGNOSIS — I21A1 Myocardial infarction type 2: Secondary | ICD-10-CM | POA: Diagnosis not present

## 2017-09-23 DIAGNOSIS — Z9841 Cataract extraction status, right eye: Secondary | ICD-10-CM

## 2017-09-23 DIAGNOSIS — Z9911 Dependence on respirator [ventilator] status: Secondary | ICD-10-CM

## 2017-09-23 DIAGNOSIS — J189 Pneumonia, unspecified organism: Secondary | ICD-10-CM | POA: Diagnosis not present

## 2017-09-23 DIAGNOSIS — A419 Sepsis, unspecified organism: Secondary | ICD-10-CM | POA: Diagnosis present

## 2017-09-23 DIAGNOSIS — F10288 Alcohol dependence with other alcohol-induced disorder: Secondary | ICD-10-CM | POA: Diagnosis present

## 2017-09-23 DIAGNOSIS — J9601 Acute respiratory failure with hypoxia: Secondary | ICD-10-CM

## 2017-09-23 DIAGNOSIS — L8990 Pressure ulcer of unspecified site, unspecified stage: Secondary | ICD-10-CM

## 2017-09-23 DIAGNOSIS — I248 Other forms of acute ischemic heart disease: Secondary | ICD-10-CM

## 2017-09-23 DIAGNOSIS — R4020 Unspecified coma: Secondary | ICD-10-CM | POA: Diagnosis not present

## 2017-09-23 DIAGNOSIS — E872 Acidosis: Secondary | ICD-10-CM | POA: Diagnosis present

## 2017-09-23 DIAGNOSIS — E11649 Type 2 diabetes mellitus with hypoglycemia without coma: Secondary | ICD-10-CM | POA: Diagnosis not present

## 2017-09-23 DIAGNOSIS — K0889 Other specified disorders of teeth and supporting structures: Secondary | ICD-10-CM | POA: Diagnosis present

## 2017-09-23 DIAGNOSIS — Z951 Presence of aortocoronary bypass graft: Secondary | ICD-10-CM

## 2017-09-23 DIAGNOSIS — R0600 Dyspnea, unspecified: Secondary | ICD-10-CM

## 2017-09-23 DIAGNOSIS — J90 Pleural effusion, not elsewhere classified: Secondary | ICD-10-CM | POA: Diagnosis not present

## 2017-09-23 DIAGNOSIS — J9690 Respiratory failure, unspecified, unspecified whether with hypoxia or hypercapnia: Secondary | ICD-10-CM | POA: Diagnosis not present

## 2017-09-23 DIAGNOSIS — Z833 Family history of diabetes mellitus: Secondary | ICD-10-CM

## 2017-09-23 DIAGNOSIS — N183 Chronic kidney disease, stage 3 (moderate): Secondary | ICD-10-CM | POA: Diagnosis present

## 2017-09-23 DIAGNOSIS — G9341 Metabolic encephalopathy: Secondary | ICD-10-CM | POA: Diagnosis present

## 2017-09-23 DIAGNOSIS — R112 Nausea with vomiting, unspecified: Secondary | ICD-10-CM | POA: Diagnosis not present

## 2017-09-23 DIAGNOSIS — I2489 Other forms of acute ischemic heart disease: Secondary | ICD-10-CM

## 2017-09-23 DIAGNOSIS — I471 Supraventricular tachycardia: Secondary | ICD-10-CM | POA: Diagnosis not present

## 2017-09-23 DIAGNOSIS — I340 Nonrheumatic mitral (valve) insufficiency: Secondary | ICD-10-CM | POA: Diagnosis not present

## 2017-09-23 DIAGNOSIS — R918 Other nonspecific abnormal finding of lung field: Secondary | ICD-10-CM | POA: Diagnosis not present

## 2017-09-23 DIAGNOSIS — R69 Illness, unspecified: Secondary | ICD-10-CM | POA: Diagnosis not present

## 2017-09-23 DIAGNOSIS — L89893 Pressure ulcer of other site, stage 3: Secondary | ICD-10-CM | POA: Diagnosis not present

## 2017-09-23 DIAGNOSIS — I472 Ventricular tachycardia: Secondary | ICD-10-CM | POA: Diagnosis not present

## 2017-09-23 DIAGNOSIS — I5023 Acute on chronic systolic (congestive) heart failure: Secondary | ICD-10-CM | POA: Diagnosis not present

## 2017-09-23 DIAGNOSIS — Z515 Encounter for palliative care: Secondary | ICD-10-CM | POA: Diagnosis not present

## 2017-09-23 DIAGNOSIS — J690 Pneumonitis due to inhalation of food and vomit: Secondary | ICD-10-CM | POA: Diagnosis present

## 2017-09-23 DIAGNOSIS — Z8673 Personal history of transient ischemic attack (TIA), and cerebral infarction without residual deficits: Secondary | ICD-10-CM

## 2017-09-23 DIAGNOSIS — R6521 Severe sepsis with septic shock: Secondary | ICD-10-CM | POA: Diagnosis not present

## 2017-09-23 DIAGNOSIS — E878 Other disorders of electrolyte and fluid balance, not elsewhere classified: Secondary | ICD-10-CM | POA: Diagnosis present

## 2017-09-23 DIAGNOSIS — Z430 Encounter for attention to tracheostomy: Secondary | ICD-10-CM

## 2017-09-23 DIAGNOSIS — Z9842 Cataract extraction status, left eye: Secondary | ICD-10-CM

## 2017-09-23 DIAGNOSIS — I452 Bifascicular block: Secondary | ICD-10-CM | POA: Diagnosis present

## 2017-09-23 DIAGNOSIS — Z8679 Personal history of other diseases of the circulatory system: Secondary | ICD-10-CM

## 2017-09-23 DIAGNOSIS — Z9289 Personal history of other medical treatment: Secondary | ICD-10-CM

## 2017-09-23 DIAGNOSIS — J9509 Other tracheostomy complication: Secondary | ICD-10-CM | POA: Diagnosis not present

## 2017-09-23 DIAGNOSIS — Z7982 Long term (current) use of aspirin: Secondary | ICD-10-CM

## 2017-09-23 DIAGNOSIS — E1151 Type 2 diabetes mellitus with diabetic peripheral angiopathy without gangrene: Secondary | ICD-10-CM | POA: Diagnosis present

## 2017-09-23 DIAGNOSIS — Z781 Physical restraint status: Secondary | ICD-10-CM

## 2017-09-23 DIAGNOSIS — Z7189 Other specified counseling: Secondary | ICD-10-CM | POA: Diagnosis not present

## 2017-09-23 DIAGNOSIS — Z66 Do not resuscitate: Secondary | ICD-10-CM

## 2017-09-23 DIAGNOSIS — Y848 Other medical procedures as the cause of abnormal reaction of the patient, or of later complication, without mention of misadventure at the time of the procedure: Secondary | ICD-10-CM | POA: Diagnosis not present

## 2017-09-23 DIAGNOSIS — E1122 Type 2 diabetes mellitus with diabetic chronic kidney disease: Secondary | ICD-10-CM | POA: Diagnosis present

## 2017-09-23 DIAGNOSIS — Z87891 Personal history of nicotine dependence: Secondary | ICD-10-CM

## 2017-09-23 DIAGNOSIS — L89812 Pressure ulcer of head, stage 2: Secondary | ICD-10-CM | POA: Diagnosis not present

## 2017-09-23 DIAGNOSIS — R0902 Hypoxemia: Secondary | ICD-10-CM | POA: Diagnosis not present

## 2017-09-23 DIAGNOSIS — R1310 Dysphagia, unspecified: Secondary | ICD-10-CM | POA: Diagnosis not present

## 2017-09-23 DIAGNOSIS — Z930 Tracheostomy status: Secondary | ICD-10-CM

## 2017-09-23 DIAGNOSIS — G9340 Encephalopathy, unspecified: Secondary | ICD-10-CM | POA: Diagnosis not present

## 2017-09-23 DIAGNOSIS — R571 Hypovolemic shock: Secondary | ICD-10-CM | POA: Diagnosis present

## 2017-09-23 DIAGNOSIS — I484 Atypical atrial flutter: Secondary | ICD-10-CM | POA: Diagnosis not present

## 2017-09-23 DIAGNOSIS — Z7984 Long term (current) use of oral hypoglycemic drugs: Secondary | ICD-10-CM

## 2017-09-23 DIAGNOSIS — E870 Hyperosmolality and hypernatremia: Secondary | ICD-10-CM | POA: Diagnosis not present

## 2017-09-23 DIAGNOSIS — I5043 Acute on chronic combined systolic (congestive) and diastolic (congestive) heart failure: Secondary | ICD-10-CM | POA: Diagnosis not present

## 2017-09-23 DIAGNOSIS — I130 Hypertensive heart and chronic kidney disease with heart failure and stage 1 through stage 4 chronic kidney disease, or unspecified chronic kidney disease: Secondary | ICD-10-CM | POA: Diagnosis present

## 2017-09-23 DIAGNOSIS — R0602 Shortness of breath: Secondary | ICD-10-CM

## 2017-09-23 DIAGNOSIS — R52 Pain, unspecified: Secondary | ICD-10-CM | POA: Diagnosis not present

## 2017-09-23 DIAGNOSIS — R05 Cough: Secondary | ICD-10-CM | POA: Diagnosis not present

## 2017-09-23 DIAGNOSIS — F419 Anxiety disorder, unspecified: Secondary | ICD-10-CM | POA: Diagnosis present

## 2017-09-23 DIAGNOSIS — Z431 Encounter for attention to gastrostomy: Secondary | ICD-10-CM

## 2017-09-23 DIAGNOSIS — L89892 Pressure ulcer of other site, stage 2: Secondary | ICD-10-CM | POA: Diagnosis not present

## 2017-09-23 DIAGNOSIS — E1165 Type 2 diabetes mellitus with hyperglycemia: Secondary | ICD-10-CM | POA: Diagnosis not present

## 2017-09-23 DIAGNOSIS — Z8701 Personal history of pneumonia (recurrent): Secondary | ICD-10-CM

## 2017-09-23 DIAGNOSIS — J9600 Acute respiratory failure, unspecified whether with hypoxia or hypercapnia: Secondary | ICD-10-CM | POA: Diagnosis not present

## 2017-09-23 DIAGNOSIS — D6959 Other secondary thrombocytopenia: Secondary | ICD-10-CM | POA: Diagnosis present

## 2017-09-23 DIAGNOSIS — I480 Paroxysmal atrial fibrillation: Secondary | ICD-10-CM | POA: Diagnosis not present

## 2017-09-23 DIAGNOSIS — Z993 Dependence on wheelchair: Secondary | ICD-10-CM

## 2017-09-23 DIAGNOSIS — E440 Moderate protein-calorie malnutrition: Secondary | ICD-10-CM | POA: Diagnosis not present

## 2017-09-23 DIAGNOSIS — D631 Anemia in chronic kidney disease: Secondary | ICD-10-CM | POA: Diagnosis present

## 2017-09-23 DIAGNOSIS — T8189XA Other complications of procedures, not elsewhere classified, initial encounter: Secondary | ICD-10-CM

## 2017-09-23 DIAGNOSIS — R61 Generalized hyperhidrosis: Secondary | ICD-10-CM | POA: Diagnosis not present

## 2017-09-23 DIAGNOSIS — Z6826 Body mass index (BMI) 26.0-26.9, adult: Secondary | ICD-10-CM

## 2017-09-23 DIAGNOSIS — Z7401 Bed confinement status: Secondary | ICD-10-CM

## 2017-09-23 DIAGNOSIS — I2510 Atherosclerotic heart disease of native coronary artery without angina pectoris: Secondary | ICD-10-CM | POA: Diagnosis present

## 2017-09-23 DIAGNOSIS — J9811 Atelectasis: Secondary | ICD-10-CM | POA: Diagnosis not present

## 2017-09-23 DIAGNOSIS — D638 Anemia in other chronic diseases classified elsewhere: Secondary | ICD-10-CM | POA: Diagnosis present

## 2017-09-23 DIAGNOSIS — Z4659 Encounter for fitting and adjustment of other gastrointestinal appliance and device: Secondary | ICD-10-CM | POA: Diagnosis not present

## 2017-09-23 DIAGNOSIS — I252 Old myocardial infarction: Secondary | ICD-10-CM

## 2017-09-23 DIAGNOSIS — I255 Ischemic cardiomyopathy: Secondary | ICD-10-CM | POA: Diagnosis present

## 2017-09-23 DIAGNOSIS — Z4682 Encounter for fitting and adjustment of non-vascular catheter: Secondary | ICD-10-CM | POA: Diagnosis not present

## 2017-09-23 DIAGNOSIS — J181 Lobar pneumonia, unspecified organism: Secondary | ICD-10-CM | POA: Diagnosis not present

## 2017-09-23 DIAGNOSIS — R0689 Other abnormalities of breathing: Secondary | ICD-10-CM | POA: Diagnosis not present

## 2017-09-23 DIAGNOSIS — E785 Hyperlipidemia, unspecified: Secondary | ICD-10-CM | POA: Diagnosis present

## 2017-09-23 DIAGNOSIS — K8020 Calculus of gallbladder without cholecystitis without obstruction: Secondary | ICD-10-CM | POA: Diagnosis not present

## 2017-09-23 DIAGNOSIS — Z8249 Family history of ischemic heart disease and other diseases of the circulatory system: Secondary | ICD-10-CM

## 2017-09-23 LAB — CBC
HCT: 31.6 % — ABNORMAL LOW (ref 39.0–52.0)
Hemoglobin: 9.9 g/dL — ABNORMAL LOW (ref 13.0–17.0)
MCH: 30.5 pg (ref 26.0–34.0)
MCHC: 31.3 g/dL (ref 30.0–36.0)
MCV: 97.2 fL (ref 78.0–100.0)
PLATELETS: 98 10*3/uL — AB (ref 150–400)
RBC: 3.25 MIL/uL — ABNORMAL LOW (ref 4.22–5.81)
RDW: 15.3 % (ref 11.5–15.5)
WBC: 7.5 10*3/uL (ref 4.0–10.5)

## 2017-09-23 LAB — ETHANOL: Alcohol, Ethyl (B): <10 mg/dL (ref <10)

## 2017-09-23 LAB — POCT I-STAT 3, ART BLOOD GAS (G3+)
Acid-base deficit: 9 mmol/L — ABNORMAL HIGH (ref 0.0–2.0)
Bicarbonate: 15 mmol/L — ABNORMAL LOW (ref 20.0–28.0)
O2 SAT: 100 %
PCO2 ART: 23.9 mmHg — AB (ref 32.0–48.0)
PH ART: 7.399 (ref 7.350–7.450)
Patient temperature: 35.8
TCO2: 16 mmol/L — ABNORMAL LOW (ref 22–32)
pO2, Arterial: 477 mmHg — ABNORMAL HIGH (ref 83.0–108.0)

## 2017-09-23 LAB — CBC WITH DIFFERENTIAL/PLATELET
ABS IMMATURE GRANULOCYTES: 0 10*3/uL (ref 0.0–0.1)
Basophils Absolute: 0 10*3/uL (ref 0.0–0.1)
Basophils Relative: 0 %
Eosinophils Absolute: 0 10*3/uL (ref 0.0–0.7)
Eosinophils Relative: 0 %
HEMATOCRIT: 38.1 % — AB (ref 39.0–52.0)
HEMOGLOBIN: 11.9 g/dL — AB (ref 13.0–17.0)
IMMATURE GRANULOCYTES: 1 %
LYMPHS ABS: 1.1 10*3/uL (ref 0.7–4.0)
Lymphocytes Relative: 16 %
MCH: 30.4 pg (ref 26.0–34.0)
MCHC: 31.2 g/dL (ref 30.0–36.0)
MCV: 97.4 fL (ref 78.0–100.0)
Monocytes Absolute: 0.6 10*3/uL (ref 0.1–1.0)
Monocytes Relative: 8 %
NEUTROS ABS: 5.4 10*3/uL (ref 1.7–7.7)
NEUTROS PCT: 75 %
Platelets: 129 10*3/uL — ABNORMAL LOW (ref 150–400)
RBC: 3.91 MIL/uL — AB (ref 4.22–5.81)
RDW: 15.4 % (ref 11.5–15.5)
WBC: 7.2 10*3/uL (ref 4.0–10.5)

## 2017-09-23 LAB — CREATININE, SERUM
Creatinine, Ser: 1.5 mg/dL — ABNORMAL HIGH (ref 0.61–1.24)
GFR, EST AFRICAN AMERICAN: 49 mL/min — AB (ref >60)
GFR, EST NON AFRICAN AMERICAN: 42 mL/min — AB (ref >60)

## 2017-09-23 LAB — URINALYSIS, ROUTINE W REFLEX MICROSCOPIC
Bilirubin Urine: NEGATIVE
GLUCOSE, UA: NEGATIVE mg/dL
KETONES UR: NEGATIVE mg/dL
LEUKOCYTES UA: NEGATIVE
NITRITE: NEGATIVE
PH: 5 (ref 5.0–8.0)
Protein, ur: 100 mg/dL — AB
Specific Gravity, Urine: 1.017 (ref 1.005–1.030)

## 2017-09-23 LAB — I-STAT ARTERIAL BLOOD GAS, ED
Acid-base deficit: 9 mmol/L — ABNORMAL HIGH (ref 0.0–2.0)
BICARBONATE: 17 mmol/L — AB (ref 20.0–28.0)
O2 Saturation: 35 %
Patient temperature: 36.4
TCO2: 18 mmol/L — ABNORMAL LOW (ref 22–32)
pCO2 arterial: 35.6 mmHg (ref 32.0–48.0)
pH, Arterial: 7.283 — ABNORMAL LOW (ref 7.350–7.450)
pO2, Arterial: 23 mmHg — CL (ref 83.0–108.0)

## 2017-09-23 LAB — I-STAT CG4 LACTIC ACID, ED
Lactic Acid, Venous: 8.74 mmol/L (ref 0.5–1.9)
Lactic Acid, Venous: 4.32 mmol/L (ref 0.5–1.9)

## 2017-09-23 LAB — I-STAT VENOUS BLOOD GAS, ED
ACID-BASE DEFICIT: 7 mmol/L — AB (ref 0.0–2.0)
BICARBONATE: 18 mmol/L — AB (ref 20.0–28.0)
O2 Saturation: 58 %
PH VEN: 7.337 (ref 7.250–7.430)
TCO2: 19 mmol/L — ABNORMAL LOW (ref 22–32)
pCO2, Ven: 33.6 mmHg — ABNORMAL LOW (ref 44.0–60.0)
pO2, Ven: 32 mmHg (ref 32.0–45.0)

## 2017-09-23 LAB — BASIC METABOLIC PANEL
ANION GAP: 10 (ref 5–15)
BUN: 24 mg/dL — ABNORMAL HIGH (ref 6–20)
CHLORIDE: 112 mmol/L — AB (ref 101–111)
CO2: 18 mmol/L — ABNORMAL LOW (ref 22–32)
Calcium: 7.9 mg/dL — ABNORMAL LOW (ref 8.9–10.3)
Creatinine, Ser: 1.61 mg/dL — ABNORMAL HIGH (ref 0.61–1.24)
GFR calc Af Amer: 45 mL/min — ABNORMAL LOW (ref >60)
GFR calc non Af Amer: 39 mL/min — ABNORMAL LOW (ref >60)
GLUCOSE: 162 mg/dL — AB (ref 65–99)
Potassium: 5.1 mmol/L (ref 3.5–5.1)
Sodium: 140 mmol/L (ref 135–145)

## 2017-09-23 LAB — COMPREHENSIVE METABOLIC PANEL
ALT: 26 U/L (ref 17–63)
AST: 69 U/L — ABNORMAL HIGH (ref 15–41)
Albumin: 3.5 g/dL (ref 3.5–5.0)
Alkaline Phosphatase: 185 U/L — ABNORMAL HIGH (ref 38–126)
Anion gap: 18 — ABNORMAL HIGH (ref 5–15)
BUN: 22 mg/dL — ABNORMAL HIGH (ref 6–20)
CHLORIDE: 102 mmol/L (ref 101–111)
CO2: 18 mmol/L — ABNORMAL LOW (ref 22–32)
Calcium: 9.2 mg/dL (ref 8.9–10.3)
Creatinine, Ser: 1.84 mg/dL — ABNORMAL HIGH (ref 0.61–1.24)
GFR, EST AFRICAN AMERICAN: 38 mL/min — AB (ref >60)
GFR, EST NON AFRICAN AMERICAN: 33 mL/min — AB (ref >60)
Glucose, Bld: 176 mg/dL — ABNORMAL HIGH (ref 65–99)
POTASSIUM: 6 mmol/L — AB (ref 3.5–5.1)
SODIUM: 138 mmol/L (ref 135–145)
Total Bilirubin: 2.7 mg/dL — ABNORMAL HIGH (ref 0.3–1.2)
Total Protein: 8.2 g/dL — ABNORMAL HIGH (ref 6.5–8.1)

## 2017-09-23 LAB — GLUCOSE, CAPILLARY
GLUCOSE-CAPILLARY: 133 mg/dL — AB (ref 65–99)
Glucose-Capillary: 145 mg/dL — ABNORMAL HIGH (ref 65–99)

## 2017-09-23 LAB — BRAIN NATRIURETIC PEPTIDE: B Natriuretic Peptide: 3168.7 pg/mL — ABNORMAL HIGH (ref 0.0–100.0)

## 2017-09-23 LAB — PROCALCITONIN: Procalcitonin: 0.19 ng/mL

## 2017-09-23 LAB — CBG MONITORING, ED: Glucose-Capillary: 158 mg/dL — ABNORMAL HIGH (ref 65–99)

## 2017-09-23 LAB — TRIGLYCERIDES: Triglycerides: 71 mg/dL (ref <150)

## 2017-09-23 LAB — LACTIC ACID, PLASMA
LACTIC ACID, VENOUS: 3.5 mmol/L — AB (ref 0.5–1.9)
Lactic Acid, Venous: 4.3 mmol/L (ref 0.5–1.9)

## 2017-09-23 LAB — TROPONIN I
TROPONIN I: 0.16 ng/mL — AB (ref <0.03)
Troponin I: 0.22 ng/mL (ref <0.03)

## 2017-09-23 LAB — MRSA PCR SCREENING: MRSA BY PCR: NEGATIVE

## 2017-09-23 MED ORDER — HEPARIN SODIUM (PORCINE) 5000 UNIT/ML IJ SOLN
5000.0000 [IU] | Freq: Three times a day (TID) | INTRAMUSCULAR | Status: DC
  Administered 2017-09-23 – 2017-10-25 (×95): 5000 [IU] via SUBCUTANEOUS
  Filled 2017-09-23 (×87): qty 1

## 2017-09-23 MED ORDER — ORAL CARE MOUTH RINSE
15.0000 mL | OROMUCOSAL | Status: DC
  Administered 2017-09-23 – 2017-10-01 (×70): 15 mL via OROMUCOSAL

## 2017-09-23 MED ORDER — PROPOFOL 1000 MG/100ML IV EMUL
INTRAVENOUS | Status: AC
  Filled 2017-09-23: qty 100

## 2017-09-23 MED ORDER — SODIUM CHLORIDE 0.9 % IV SOLN
1000.0000 mL | INTRAVENOUS | Status: DC
  Administered 2017-09-23 (×2): 1000 mL via INTRAVENOUS

## 2017-09-23 MED ORDER — FAMOTIDINE IN NACL 20-0.9 MG/50ML-% IV SOLN
20.0000 mg | Freq: Two times a day (BID) | INTRAVENOUS | Status: DC
  Administered 2017-09-23 – 2017-09-24 (×2): 20 mg via INTRAVENOUS
  Filled 2017-09-23 (×2): qty 50

## 2017-09-23 MED ORDER — SODIUM CHLORIDE 0.9 % IV SOLN
2.0000 g | INTRAVENOUS | Status: AC
  Administered 2017-09-24 – 2017-09-30 (×7): 2 g via INTRAVENOUS
  Filled 2017-09-23 (×7): qty 2

## 2017-09-23 MED ORDER — PROPOFOL 1000 MG/100ML IV EMUL
0.0000 ug/kg/min | INTRAVENOUS | Status: DC
  Administered 2017-09-23: 5 ug/kg/min via INTRAVENOUS
  Administered 2017-09-24: 15 ug/kg/min via INTRAVENOUS
  Administered 2017-09-24: 10 ug/kg/min via INTRAVENOUS
  Administered 2017-09-24: 25 ug/kg/min via INTRAVENOUS
  Administered 2017-09-25: 15 ug/kg/min via INTRAVENOUS
  Administered 2017-09-25: 20 ug/kg/min via INTRAVENOUS
  Filled 2017-09-23 (×3): qty 100

## 2017-09-23 MED ORDER — CHLORHEXIDINE GLUCONATE 0.12% ORAL RINSE (MEDLINE KIT)
15.0000 mL | Freq: Two times a day (BID) | OROMUCOSAL | Status: DC
  Administered 2017-09-23 – 2017-09-30 (×15): 15 mL via OROMUCOSAL

## 2017-09-23 MED ORDER — PROPOFOL 1000 MG/100ML IV EMUL
5.0000 ug/kg/min | Freq: Once | INTRAVENOUS | Status: DC
  Filled 2017-09-23: qty 100

## 2017-09-23 MED ORDER — ROCURONIUM BROMIDE 50 MG/5ML IV SOLN
INTRAVENOUS | Status: AC | PRN
  Administered 2017-09-23: 100 mg via INTRAVENOUS

## 2017-09-23 MED ORDER — VANCOMYCIN HCL IN DEXTROSE 1-5 GM/200ML-% IV SOLN
1000.0000 mg | INTRAVENOUS | Status: DC
  Administered 2017-09-24 – 2017-09-28 (×5): 1000 mg via INTRAVENOUS
  Filled 2017-09-23 (×5): qty 200

## 2017-09-23 MED ORDER — SODIUM CHLORIDE 0.9 % IV BOLUS (SEPSIS)
500.0000 mL | Freq: Once | INTRAVENOUS | Status: AC
  Administered 2017-09-23: 500 mL via INTRAVENOUS

## 2017-09-23 MED ORDER — SODIUM CHLORIDE 0.9 % IV SOLN
2.0000 g | Freq: Once | INTRAVENOUS | Status: AC
  Administered 2017-09-23: 2 g via INTRAVENOUS
  Filled 2017-09-23: qty 2

## 2017-09-23 MED ORDER — VANCOMYCIN HCL 10 G IV SOLR
1500.0000 mg | Freq: Once | INTRAVENOUS | Status: AC
  Administered 2017-09-23: 1500 mg via INTRAVENOUS
  Filled 2017-09-23: qty 1500

## 2017-09-23 MED ORDER — NOREPINEPHRINE 4 MG/250ML-% IV SOLN
0.0000 ug/min | Freq: Once | INTRAVENOUS | Status: AC
  Administered 2017-09-23: 2 ug/min via INTRAVENOUS
  Filled 2017-09-23: qty 250

## 2017-09-23 MED ORDER — VANCOMYCIN HCL IN DEXTROSE 1-5 GM/200ML-% IV SOLN: 1000.0000 mg | Freq: Once | INTRAVENOUS | Status: DC

## 2017-09-23 MED ORDER — FENTANYL CITRATE (PF) 100 MCG/2ML IJ SOLN
50.0000 ug | INTRAMUSCULAR | Status: DC | PRN
  Administered 2017-09-24: 50 ug via INTRAVENOUS
  Filled 2017-09-23: qty 2

## 2017-09-23 MED ORDER — SODIUM CHLORIDE 0.9 % IV SOLN: 250.0000 mL | INTRAVENOUS | Status: DC | PRN

## 2017-09-23 MED ORDER — ETOMIDATE 2 MG/ML IV SOLN
INTRAVENOUS | Status: AC | PRN
  Administered 2017-09-23: 20 mg via INTRAVENOUS

## 2017-09-23 MED ORDER — FENTANYL CITRATE (PF) 100 MCG/2ML IJ SOLN
50.0000 ug | INTRAMUSCULAR | Status: DC | PRN
  Administered 2017-09-23: 50 ug via INTRAVENOUS
  Filled 2017-09-23: qty 2

## 2017-09-23 MED ORDER — SODIUM CHLORIDE 0.9 % IV BOLUS (SEPSIS)
1000.0000 mL | Freq: Once | INTRAVENOUS | Status: AC
  Administered 2017-09-23: 1000 mL via INTRAVENOUS

## 2017-09-23 NOTE — Progress Notes (Signed)
Pharmacy Antibiotic Note  Johnny NewcomerDan E [name] is a 83 y.o. male admitted on 10/17/2017 with sepsis.  Pharmacy has been consulted for vancomycin and cefepime dosing. Cefepime 2g IV x1 ordered by EDP. CrCl ~35 l/min.   Plan: Vancomycin 1500mg  IV x1 then 1000mg  IV q24h Cefepime 2g IV q24h Monitor LOT, cultures, renal funx  Weight: 180 lb (81.6 kg)  No data recorded.  No results for input(s): WBC, CREATININE, LATICACIDVEN, VANCOTROUGH, VANCOPEAK, VANCORANDOM, GENTTROUGH, GENTPEAK, GENTRANDOM, TOBRATROUGH, TOBRAPEAK, TOBRARND, AMIKACINPEAK, AMIKACINTROU, AMIKACIN in the last 168 hours.  CrCl cannot be calculated (Patient's most recent lab result is older than the maximum 21 days allowed.).    No Known Allergies  Antimicrobials this admission: Cefepime 6/5 >>  Vancomycin 6/5 >>   Dose adjustments this admission: none  Microbiology results: sent  Thank you for allowing pharmacy to be a part of this patient's care.  Fredonia HighlandMichael Bitonti, PharmD, BCPS PGY-2 Cardiology Pharmacy Resident Phone: [phone] 10/16/2017

## 2017-09-23 NOTE — ED Notes (Signed)
CG-4 of 8.74 reported to Dr. Miller and EvanHyacinth Meekergeline GulaKaren-RN

## 2017-09-23 NOTE — ED Triage Notes (Signed)
To ED via [facility] from home with c/o shortness of breath -- pt has been coughing up brownish sputum x 2 days, became short of breath and diaphoretic today--

## 2017-09-23 NOTE — Progress Notes (Signed)
eLink Physician-Brief Progress Note Patient Name: Johnny NewcomerDan E Hedberg DOB: [birth year] MRN: [id number]   Date of Service  10/08/2017  HPI/Events of Note  Persistent diarrhoea  eICU Interventions  Order to insert Flexiseal entered        Migdalia DkOkoronkwo U Juliannah Ohmann 10/02/2017, 9:25 PM

## 2017-09-23 NOTE — ED Provider Notes (Signed)
[place] [facility] EMERGENCY DEPARTMENT Provider Note   CSN: [id number] Arrival date & time: 10/11/2017  1352     History   Chief Complaint Chief Complaint  Patient presents with  . Shortness of Breath  . Altered Mental Status    HPI Johnny [name] is a 83 y.o. male.  HPI  The patient is an ill-appearing 83 year old male who presents with family in severe distress after he was found to be in respiratory distress confused and severely diaphoretic at home.  According to the family member he has been on a several day binge of alcohol, drinking heavily, when he left he did seem to be in his normal state of health in a wheelchair assisted by family members, they try to give him a bath but it is unclear exactly what happened next however he presents in this condition barely able to give any information due to his distress.  The family member states that he was having significant difficulty with breathing, he required a nonrebreather, he was cold and diaphoretic on arrival, the patient is unable to give any other information due to the acuity of his condition, level 5 caveat applies  Past Medical History:  Diagnosis Date  . Acute alcoholic hepatitis 6/73/4193  . Altered mental status 04/01/2012  . Anemia of other chronic disease 08/16/2007  . Arthritis   . CAD (coronary artery disease)    a. s/p CABG, notes unclear - 1998 or 2003?  Marland Kitchen Chronic low back pain   . Coagulopathy ([place])   . Diabetes mellitus ([place])   . Essential hypertension   . ETOH abuse   . Hallucination 04/01/2012  . History of pneumonia   . History of stroke   . Hyperlipidemia   . Paroxysmal atrial fibrillation (HCC)   . Paroxysmal atrial flutter ([place])    a. s/p ablation 2007.  Marland Kitchen PEA (Pulseless electrical activity) ([place])    a. h/o PEA cardiac arrest 04/2012 (felt r/t PNA, acidosis, hypoxic resp failure in setting of alcohol withdrawal; required tracheostomy)  . Protein calorie malnutrition ([place])   . Seizures  ([place]) 04/01/2012    Patient Active Problem List   Diagnosis Date Noted  . Elevated troponin   . Chest pain 11/16/2016  . CAP (community acquired pneumonia) 09/22/2015  . Pressure ulcer 09/22/2015  . Alcoholic cardiomyopathy ([place]) 05/02/2015  . Debility   . COPD (chronic obstructive pulmonary disease), presumed 04/26/2012  . Protein calorie malnutrition ([place]) 04/26/2012  . Poor dentition 04/26/2012  . Atrial flutter ([place]) 04/13/2012  . Protein-calorie malnutrition, severe ([place]) 04/11/2012  . s/p PEA Cardiac arrest: Likely related to pneumonia, acidosis, hypoxic respiratory failure in setting of ETOH withdrawal 04/02/2012  . Anoxic Encephalopathy: due to hypoxia after cardiac arrest.  04/02/2012  . HCAP (healthcare-associated pneumonia) -->resolved 04/01/2012  . Alcohol dependence s/p withdrawl  04/01/2012  . acute renal failure (resolved): Baseline creatinine 0.95 from 01/05/12.  04/01/2012  . SIRS (systemic inflammatory response syndrome) ([place]) 12/30/2011  . Hypomagnesemia 12/30/2011  . Weakness generalized 12/29/2011  . Hypertensive urgency 12/29/2011  . Tachycardia 04/28/2011  . Lower extremity weakness 04/28/2011  . Anemia of chronic disease 08/16/2007  . Paroxysmal atrial fibrillation ([place]) 08/12/2007  . Diabetes mellitus with peripheral vascular disease ([place]) 12/11/2006  . Essential hypertension 12/11/2006  . Coronary atherosclerosis 12/11/2006    Past Surgical History:  Procedure Laterality Date  . CATARACT EXTRACTION W/ INTRAOCULAR LENS  IMPLANT, BILATERAL     "years ago" (04/01/2012)  . CORONARY ARTERY BYPASS GRAFT  ~  2003   CABG X4  . ESOPHAGOGASTRODUODENOSCOPY  04/13/2012   Procedure: ESOPHAGOGASTRODUODENOSCOPY (EGD);  Surgeon: Cleotis Nipper, MD;  Location[facility] ENDOSCOPY;  Service: Endoscopy;  Laterality: N/A;  push peg  . LEFT HEART CATH AND CORS/GRAFTS ANGIOGRAPHY N/A 11/17/2016   Procedure: Left Heart Cath and Cors/Grafts Angiography;  Surgeon: Jettie Booze, MD;  Location: [place] CV LAB;  Service: Cardiovascular;  Laterality: N/A;  . PEG PLACEMENT  04/13/2012   Procedure: PERCUTANEOUS ENDOSCOPIC GASTROSTOMY (PEG) PLACEMENT;  Surgeon: Cleotis Nipper, MD;  Location: [facility] ENDOSCOPY;  Service: Endoscopy;  Laterality: N/A;        Home Medications    Prior to Admission medications   Medication Sig Start Date End Date Taking? Authorizing Provider  ACCU-CHEK AVIVA PLUS test strip USE TO TEST BLOOD SUGAR ONCE A DAY 06/25/15   Marletta Lor, MD  ACCU-CHEK SOFTCLIX LANCETS lancets USE 1 EACH BY OTHER ROUTE DAILY AS NEEDED FOR OTHER. 11/28/15   Marletta Lor, MD  acetaminophen (TYLENOL) 325 MG tablet Take 2 tablets (650 mg total) by mouth every 6 (six) hours as needed for mild pain (or Fever >/= 101). 05/08/15   Robbie Lis, MD  aspirin 81 MG tablet Take 81 mg by mouth daily.    [provider]  atorvastatin (LIPITOR) 20 MG tablet Take 1 tablet (20 mg total) by mouth daily. 09/18/17   Marletta Lor, MD  Blood Glucose Calibration (ACCU-CHEK AVIVA) [place]  06/09/16   [provider]  clopidogrel (PLAVIX) 75 MG tablet TAKE 1 TABLET BY MOUTH EVERY DAY 01/23/17   Marletta Lor, MD  folic acid (FOLVITE) 1 MG tablet Take 1 tablet (1 mg total) by mouth daily. 01/29/17   Marletta Lor, MD  Lancet Devices (ADJUSTABLE LANCING DEVICE) [place]  03/24/16   [provider]  metFORMIN (GLUCOPHAGE) 500 MG tablet TAKE 1 TABLET BY MOUTH EVERY DAY WITH BREAKFAST 06/23/17   Marletta Lor, MD  metoprolol tartrate (LOPRESSOR) 50 MG tablet TAKE 1 TABLET BY MOUTH TWICE A DAY 12/26/16   Marletta Lor, MD  metoprolol tartrate (LOPRESSOR) 50 MG tablet Take 1.5 tablets (75 mg total) by mouth 2 (two) times daily. 09/15/17   Marletta Lor, MD  Multiple Vitamin (MULTIVITAMIN) tablet Take 1 tablet by mouth daily. Reported on 11/08/2015    [provider]  nitroGLYCERIN (NITROSTAT) 0.4 MG SL tablet  Place 1 tablet (0.4 mg total) under the tongue every 5 (five) minutes as needed for chest pain. Up to 3 doses. 11/18/16   Hongalgi, Lenis Dickinson, MD  tamsulosin (FLOMAX) 0.4 MG CAPS capsule TAKE 1 CAPSULE (0.4 MG TOTAL) BY MOUTH DAILY. 10/13/16   Marletta Lor, MD  thiamine ([employer] B-1) 100 MG tablet Take 1 tablet (100 mg total) by mouth daily. 01/23/17   Marletta Lor, MD    Family History Family History  Problem Relation Age of Onset  . Heart failure Mother   . Aneurysm Sister   . Suicidality Brother   . Heart failure Brother   . Diabetes Sister     Social History Social History   Tobacco Use  . Smoking status: Former Smoker    Packs/day: 0.50    Years: 17.00    Pack years: 8.50    Types: Cigarettes    Last attempt to quit: 04/22/1983    Years since quitting: 34.4  . Smokeless tobacco: Never Used  . Tobacco comment: 04/01/2012 "quit smoking 20+ yr ago"  Substance Use Topics  . Alcohol use: Yes    Alcohol/week: 10.8 oz    Types: 2 Glasses of wine, 16 Shots of liquor per week    Comment: hx of ETOH abuse; 04/01/2012 "drink ~ 1 pint//wk; vodka; cup of wine/wk" 04/2015 40 oz wine per day  . Drug use: No     Allergies   Patient has no known allergies.   Review of Systems Review of Systems  Unable to perform ROS: Acuity of condition     Physical Exam Updated Vital Signs BP 107/72 (BP Location: Right Arm)   Pulse 83   Temp 100.1 F (37.8 C) (Rectal)   Resp (!) 32   Ht 5' 10.5" (1.791 m)   Wt 81.6 kg (180 lb)   SpO2 (!) 73%   BMI 25.46 kg/m   Physical Exam  Constitutional: He appears distressed.  HENT:  Head: Normocephalic and atraumatic.  Mouth/Throat: Oropharynx is clear and moist. No oropharyngeal exudate.  Eyes: Pupils are equal, round, and reactive to light. Conjunctivae and EOM are normal. Right eye exhibits no discharge. Left eye exhibits no discharge. No scleral icterus.  Neck: Normal range of motion. Neck supple. No JVD present. No thyromegaly  present.  Cardiovascular: Regular rhythm, normal heart sounds and intact distal pulses. Exam reveals no gallop and no friction rub.  No murmur heard. Tachycardia, weak pulses  Pulmonary/Chest: He is in respiratory distress. He has no wheezes. He has rales.  Abdominal: Soft. Bowel sounds are normal. He exhibits no distension and no mass. There is no tenderness.  Genitourinary:  Genitourinary Comments: Incontinent of stool  Musculoskeletal: Normal range of motion. He exhibits no edema or tenderness.  Lymphadenopathy:    He has no cervical adenopathy.  Neurological: He is alert. Coordination normal.  The patient is able to talk, his words are slightly slurred, he answers my questions but is slow and difficult to understand, he is able to squeeze both hands, move both legs, no obvious facial droop  Skin: Skin is warm. No rash noted. He is diaphoretic. No erythema.  Psychiatric: He has a normal mood and affect. His behavior is normal.  Nursing note and vitals reviewed.    ED Treatments / Results  Labs (all labs ordered are listed, but only abnormal results are displayed) Labs Reviewed  COMPREHENSIVE METABOLIC PANEL - Abnormal; Notable for the following components:      Result Value   Potassium 6.0 (*)    CO2 18 (*)    Glucose, Bld 176 (*)    BUN 22 (*)    Creatinine, Ser 1.84 (*)    Total Protein 8.2 (*)    AST 69 (*)    Alkaline Phosphatase 185 (*)    Total Bilirubin 2.7 (*)    GFR calc non Af Amer 33 (*)    GFR calc Af Amer 38 (*)    Anion gap 18 (*)    All other components within normal limits  CBC WITH DIFFERENTIAL/PLATELET - Abnormal; Notable for the following components:   RBC 3.91 (*)    Hemoglobin 11.9 (*)    HCT 38.1 (*)    Platelets 129 (*)    All other components within normal limits  I-STAT CG4 LACTIC ACID, ED - Abnormal; Notable for the following components:   Lactic Acid, Venous 8.74 (*)    All other components within normal limits  CBG MONITORING, ED -  Abnormal; Notable for the following components:   Glucose-Capillary 158 (*)    All other  components within normal limits  CULTURE, BLOOD (ROUTINE X 2)  CULTURE, BLOOD (ROUTINE X 2)  ETHANOL  URINALYSIS, ROUTINE W REFLEX MICROSCOPIC  BLOOD GAS, ARTERIAL  BRAIN NATRIURETIC PEPTIDE  I-STAT TROPONIN, ED  I-STAT VENOUS BLOOD GAS, ED  I-STAT CG4 LACTIC ACID, ED    EKG EKG Interpretation  Date/Time:  Wednesday September 23 2017 14:01:44 EDT Ventricular Rate:  84 PR Interval:    QRS Duration: 151 QT Interval:  446 QTC Calculation: 528 R Axis:   -77 Text Interpretation:  Sinus rhythm Probable left atrial enlargement RBBB and LAFB since last tracing no significant change Confirmed by Noemi Chapel [phone]) on 10/09/2017 2:09:56 PM Also confirmed by Noemi Chapel [phone]), editor Philomena Doheny [phone])  on 09/21/2017 2:36:05 PM   Radiology Dg Chest Port 1 View  Result Date: 10/15/2017 CLINICAL DATA:  Cough, shortness of breath. EXAM: PORTABLE CHEST 1 VIEW COMPARISON:  Radiograph of November 16, 2016. FINDINGS: Stable cardiomediastinal silhouette. Status post coronary artery bypass graft. No pneumothorax is noted. Atherosclerosis of thoracic aorta is noted. Mild bibasilar subsegmental atelectasis or edema is noted with mild pleural effusions. Bony thorax is unremarkable. IMPRESSION: Mild bibasilar subsegmental atelectasis or edema with mild pleural effusions. Aortic Atherosclerosis (ICD10-I70.0). Electronically Signed   By: Marijo Conception, M.D.   On: 09/21/2017 15:13    Procedures .Critical Care Performed by: Noemi Chapel, MD Authorized by: Noemi Chapel, MD   Critical care provider statement:    Critical care time (minutes):  35   Critical care time was exclusive of:  Separately billable procedures and treating other patients and teaching time   Critical care was necessary to treat or prevent imminent or life-threatening deterioration of the following conditions:  Respiratory failure and shock    Critical care was time spent personally by me on the following activities:  Blood draw for specimens, development of treatment plan with patient or surrogate, discussions with consultants, evaluation of patient's response to treatment, examination of patient, obtaining history from patient or surrogate, ordering and performing treatments and interventions, ordering and review of laboratory studies, ordering and review of radiographic studies, pulse oximetry, re-evaluation of patient's condition and review of old charts Procedure Name: Intubation Date/Time: 09/27/2017 3:40 PM Performed by: Noemi Chapel, MD Pre-anesthesia Checklist: Patient identified, Patient being monitored, Emergency Drugs available, Timeout performed and Suction available Oxygen Delivery Method: Non-rebreather mask Preoxygenation: Pre-oxygenation with 100% oxygen Induction Type: Rapid sequence Ventilation: Mask ventilation without difficulty Laryngoscope Size: Mac and 4 Grade View: Grade IV Tube size: 7.5 mm Number of attempts: 1 Airway Equipment and Method: Stylet Placement Confirmation: ETT inserted through vocal cords under direct vision,  CO2 detector and Breath sounds checked- equal and bilateral Secured at: 26 cm Tube secured with: ETT holder Dental Injury: Teeth and Oropharynx as per pre-operative assessment  Difficulty Due To: Difficulty was anticipated    .Central Line Date/Time: 10/05/2017 3:41 PM Performed by: Noemi Chapel, MD Authorized by: Noemi Chapel, MD   Consent:    Consent obtained:  Emergent situation Pre-procedure details:    Hand hygiene: Hand hygiene performed prior to insertion     Sterile barrier technique: All elements of maximal sterile technique followed     Skin preparation:  2% chlorhexidine   Skin preparation agent: Skin preparation agent completely dried prior to procedure   Anesthesia (see MAR for exact dosages):    Anesthesia method:  None Procedure details:    Location:  R  internal jugular   Patient position:  Reverse Trendelenburg  Procedural supplies:  Triple lumen   Catheter size:  7 Fr   Landmarks identified: yes     Ultrasound guidance: yes     Sterile ultrasound techniques: Sterile gel and sterile probe covers were used     Number of attempts:  1   Successful placement: yes   Post-procedure details:    Post-procedure:  Dressing applied and line sutured   Assessment:  Blood return through all ports, free fluid flow, no pneumothorax on x-ray and placement verified by x-ray   Patient tolerance of procedure:  Tolerated well, no immediate complications   (including critical care time)  Medications Ordered in ED Medications  0.9 %  sodium chloride infusion (has no administration in time range)  ceFEPIme (MAXIPIME) 2 g in sodium chloride 0.9 % 100 mL IVPB (has no administration in time range)  vancomycin (VANCOCIN) 1,500 mg in sodium chloride 0.9 % 500 mL IVPB (has no administration in time range)  sodium chloride 0.9 % bolus 1,000 mL (1,000 mLs Intravenous New Bag/Given 10/02/2017 1458)    And  sodium chloride 0.9 % bolus 1,000 mL (has no administration in time range)    And  sodium chloride 0.9 % bolus 500 mL (has no administration in time range)  etomidate (AMIDATE) injection (20 mg Intravenous Given 09/24/2017 1510)  rocuronium (ZEMURON) injection (100 mg Intravenous Given 09/26/2017 1510)  propofol (DIPRIVAN) 1000 MG/100ML infusion (has no administration in time range)  propofol (DIPRIVAN) 1000 MG/100ML infusion (has no administration in time range)  norepinephrine (LEVOPHED) 72m in D5W 2528mpremix infusion (has no administration in time range)     Initial Impression / Assessment and Plan / ED Course  I have reviewed the triage vital signs and the nursing notes.  Pertinent labs & imaging results that were available during my care of the patient were reviewed by me and considered in my medical decision making (see chart for details).     The patient  is in distress, the report from family is that he is also been having lots of brown-colored sputum, I am concerned given his alcohol history that he may have aspirated.  His oxygen levels are reading in the 50% range however his vital signs show a tachycardia of 110 and a blood pressure just over 100 as well.  He could be septic, he could be hypoxic, he may have aspirated, this could be community acquired pneumonia, he could be an acidosis or withdrawal from his alcohol, he is critically ill and will require multiple interventions including IV fluids, antibiotics, respiratory support.  He is given me the thumbs up to intubate as needed for ongoing and severe distress that may not turn the corner with conservative therapy.  Portable chest x-ray, labs, reevaluate.  The patient is critically ill with an acute kidney injury, he is in septic shock with a fever, an elevated lactic acid of 8.7, surprisingly he does not have a leukocytosis.  His oxygen level was unable to get about 50 to 60% with a good waveform on the monitor, he was tachypneic to 40+ breaths per minute and his end-tidal CO2 was down to 15 with his severe tachypnea.  The decision was made to intubate the patient due to decreased mental status, persistent hypoxia increased work of breathing and likely septic shock.  Family was not present for the decision however the patient was able to give me permission to intubate prior to intubation.  Central line was placed due to poor IV access and the need for  vasopressors given his severe hypotension and septic shock.  Please see notes above.  Intensive care unit physician was consulted, page at 3:35 PM.  The patient will need the ICU, he will be given 30 cc/kg of IV fluids, broad-spectrum antibiotics to cover for severe septic shock.  Discussed with the intensivist at 3:45 PM, they will admit to the ICU.  Final Clinical Impressions(s) / ED Diagnoses   Final diagnoses:  Septic shock ([place])  AKI (acute  kidney injury) [facility])    ED Discharge Orders    None       Noemi Chapel, MD 09/28/2017 1546

## 2017-09-23 NOTE — Progress Notes (Signed)
PCCM INTERVAL PROGRESS NOTE   Called to bedside after failed attempts for ABG sample. Of note patient off levophed at this point R femoral art stick performed for sample. Results noted. Vent changes: decrease O2.   Remains obtunded with no sedation, but no longer acidotic. Will order CT head.   Joneen RoachPaul Santhiago Collingsworth, AGACNP-BC [facility]/Critical Care Pager [phone] or [phone]  09/19/2017 7:03 PM

## 2017-09-23 NOTE — ED Notes (Signed)
Right IJ placed per dr Hyacinth Meekermiller

## 2017-09-23 NOTE — ED Notes (Signed)
Pt stated he is about 180 pounds.

## 2017-09-23 NOTE — Progress Notes (Signed)
Rt attempted x 2 to obtain arterial blood gas without success.  NP notified.

## 2017-09-23 NOTE — ED Notes (Signed)
Waiting for respiratory before I can transport

## 2017-09-23 NOTE — Progress Notes (Addendum)
CRITICAL VALUE ALERT  Critical Value:  Lactic 3.5  Date & Time Notied:  10/06/2017   Provider Notified: Pola CornElink MD  [place]   Orders Received/Actions taken: Will continue to monitor

## 2017-09-23 NOTE — H&P (Signed)
PULMONARY / CRITICAL CARE MEDICINE   Name: Johnny [name] MRN: [id number] DOB: [birth year]    ADMISSION DATE:  10/18/2017 CONSULTATION DATE:  6/5  REFERRING MD:  Dr. Suzi Roots EDP  CHIEF COMPLAINT:  Shock  HISTORY OF PRESENT ILLNESS:   83 year old male with PMH as below, which is significant for ETOH abuse, CAD s/p CABG, systolic CHF (ICM. LVEF 40%), alcoholic hepatitis, PAF s/p ablation, DM, and cardiac arrest in the setting of withdrawal seizures. He had been in the midst of a several day alcohol binge as reported by his family, but seemed to be doing OK (at baseline in wheelchair). He did complain of productive cough during that time. 6/5 they attempted to give him a bath and he developed significant respiratory distress and alteration in mental status. Upon arrival to the ED he had O2 sats in the 50% and ultimately required intubation. He then became hypotensive prompting IVF resuscitation and eventually CVL placement for pressors. PCCM asked to evaluate for admission.   PAST MEDICAL HISTORY :  He  has a past medical history of Acute alcoholic hepatitis (05/20/2010), Altered mental status (04/01/2012), Anemia of other chronic disease (08/16/2007), Arthritis, CAD (coronary artery disease), Chronic low back pain, Coagulopathy (HCC), Diabetes mellitus (HCC), Essential hypertension, ETOH abuse, Hallucination (04/01/2012), History of pneumonia, History of stroke, Hyperlipidemia, Paroxysmal atrial fibrillation (HCC), Paroxysmal atrial flutter (HCC), PEA (Pulseless electrical activity) (HCC), Protein calorie malnutrition (HCC), and Seizures (HCC) (04/01/2012).  PAST SURGICAL HISTORY: He  has a past surgical history that includes Coronary artery bypass graft (~ 2003); Cataract extraction w/ intraocular lens  implant, bilateral; Esophagogastroduodenoscopy (04/13/2012); PEG placement (04/13/2012); and LEFT HEART CATH AND CORS/GRAFTS ANGIOGRAPHY (N/A, 11/17/2016).  No Known Allergies  No current  facility-administered medications on file prior to encounter.    Current Outpatient Medications on File Prior to Encounter  Medication Sig  . ACCU-CHEK AVIVA PLUS test strip USE TO TEST BLOOD SUGAR ONCE A DAY  . ACCU-CHEK SOFTCLIX LANCETS lancets USE 1 EACH BY OTHER ROUTE DAILY AS NEEDED FOR OTHER.  Marland Kitchen acetaminophen (TYLENOL) 325 MG tablet Take 2 tablets (650 mg total) by mouth every 6 (six) hours as needed for mild pain (or Fever >/= 101).  Marland Kitchen aspirin 81 MG tablet Take 81 mg by mouth daily.  Marland Kitchen atorvastatin (LIPITOR) 20 MG tablet Take 1 tablet (20 mg total) by mouth daily.  . Blood Glucose Calibration (ACCU-CHEK AVIVA) SOLN   . clopidogrel (PLAVIX) 75 MG tablet TAKE 1 TABLET BY MOUTH EVERY DAY  . folic acid (FOLVITE) 1 MG tablet Take 1 tablet (1 mg total) by mouth daily.  Demetra Shiner Devices (ADJUSTABLE LANCING DEVICE) MISC   . metFORMIN (GLUCOPHAGE) 500 MG tablet TAKE 1 TABLET BY MOUTH EVERY DAY WITH BREAKFAST  . metoprolol tartrate (LOPRESSOR) 50 MG tablet TAKE 1 TABLET BY MOUTH TWICE A DAY  . metoprolol tartrate (LOPRESSOR) 50 MG tablet Take 1.5 tablets (75 mg total) by mouth 2 (two) times daily.  . Multiple Vitamin (MULTIVITAMIN) tablet Take 1 tablet by mouth daily. Reported on 11/08/2015  . nitroGLYCERIN (NITROSTAT) 0.4 MG SL tablet Place 1 tablet (0.4 mg total) under the tongue every 5 (five) minutes as needed for chest pain. Up to 3 doses.  . tamsulosin (FLOMAX) 0.4 MG CAPS capsule TAKE 1 CAPSULE (0.4 MG TOTAL) BY MOUTH DAILY.  Marland Kitchen thiamine ([employer] B-1) 100 MG tablet Take 1 tablet (100 mg total) by mouth daily.    FAMILY HISTORY:  His indicated that his mother is deceased.  He indicated that his father is deceased. He indicated that two of his three sisters are alive. He indicated that all of his three brothers are deceased.   SOCIAL HISTORY: He  reports that he quit smoking about 34 years ago. His smoking use included cigarettes. He has a 8.50 pack-year smoking history. He has never used  smokeless tobacco. He reports that he drinks about 10.8 oz of alcohol per week. He reports that he does not use drugs.  REVIEW OF SYSTEMS:   unable  SUBJECTIVE:    VITAL SIGNS: BP (!) 107/39   Pulse (!) 107   Temp 100.1 F (37.8 C) (Rectal)   Resp 14   Ht 5' 10.5" (1.791 m)   Wt 81.6 kg (180 lb)   SpO2 (!) 73%   BMI 25.46 kg/m   HEMODYNAMICS:    VENTILATOR SETTINGS: Vent Mode: PRVC FiO2 (%):  [100 %] 100 % Set Rate:  [14 bmp] 14 bmp Vt Set:  [500 mL-590 mL] 590 mL PEEP:  [5 cmH20] 5 cmH20 Plateau Pressure:  [16 cmH20] 16 cmH20  INTAKE / OUTPUT: No intake/output data recorded.  PHYSICAL EXAMINATION: General:  Frail elderly male in NAD on vent Neuro:  Unresponsive HEENT:  [place]/AT, PERRL, no appreciable JVD Cardiovascular:  RRR, no MRG Lungs:  Coarse bilateral breath sounds Abdomen: Soft, non-tender, non-distended Musculoskeletal:  No acute deformity  Skin:  Grossly intact  LABS:  BMET Recent Labs  Lab 10/06/2017 1416  NA 138  K 6.0*  CL 102  CO2 18*  BUN 22*  CREATININE 1.84*  GLUCOSE 176*    Electrolytes Recent Labs  Lab 10/06/2017 1416  CALCIUM 9.2    CBC Recent Labs  Lab 10/03/2017 1416  WBC 7.2  HGB 11.9*  HCT 38.1*  PLT 129*    Coag's No results for input(s): APTT, INR in the last 168 hours.  Sepsis Markers Recent Labs  Lab 10/14/2017 1446  LATICACIDVEN 8.74*    ABG No results for input(s): PHART, PCO2ART, PO2ART in the last 168 hours.  Liver Enzymes Recent Labs  Lab 09/28/2017 1416  AST 69*  ALT 26  ALKPHOS 185*  BILITOT 2.7*  ALBUMIN 3.5    Cardiac Enzymes No results for input(s): TROPONINI, PROBNP in the last 168 hours.  Glucose Recent Labs  Lab 10/03/2017 1356  GLUCAP 158*    Imaging Dg Chest Port 1 View  Result Date: 09/26/2017 CLINICAL DATA:  Cough, shortness of breath. EXAM: PORTABLE CHEST 1 VIEW COMPARISON:  Radiograph of November 16, 2016. FINDINGS: Stable cardiomediastinal silhouette. Status post coronary  artery bypass graft. No pneumothorax is noted. Atherosclerosis of thoracic aorta is noted. Mild bibasilar subsegmental atelectasis or edema is noted with mild pleural effusions. Bony thorax is unremarkable. IMPRESSION: Mild bibasilar subsegmental atelectasis or edema with mild pleural effusions. Aortic Atherosclerosis (ICD10-I70.0). Electronically Signed   By: Lupita Raider, M.D.   On: 09/19/2017 15:13    STUDIES:    CULTURES: Blood cultures 6/5 > Tracheal aspiration  6/5 >  ANTIBIOTICS: Cefepime 6/5 > Vanco 6/5 >  SIGNIFICANT EVENTS: 6/5 admit  LINES/TUBES: ETT 6/5 > RIJ CVL 6/5 >  DISCUSSION: 83 year old male with ETOH abuse admitted after several day ETOH binge presenting hypoxic. Felt to have aspirated. Intubated and started on pressors in ED.   ASSESSMENT / PLAN:  PULMONARY A: Acute hypoxemic respiratory failure ? CAP vs aspiration  P:   Full vent support Follow ABG CXR in am VAP bundle  CARDIOVASCULAR A:  Shock -  septic vs hypovolemic CAD s/p CABG Ischemic cardiomyopathy Hx HTN, HLD, Aflutter s/p ablation  P:  Telemetry Aggressive IVF (3L in ED) Norepinephrine infusion to keep MAP > 65mmHg Echocardiogram Trend Lactic Troponin Holding home metoprolol  RENAL A:   Hyperkalemia AKI Anion gap acidosis  P:   Give calcium Repeat K after volume.   GASTROINTESTINAL A:   No acute issues  P:   NPO Pepcid  HEMATOLOGIC A:   Anemia and thrombocytopenia. Both chronic.   P:  Subcutaneous heparin SCDs Follow CBC  INFECTIOUS A:   Suspect aspiration PNA Need to rule out UTI, UA pending  P:   Empiric ABX as above Trend PCT Follow Cx  ENDOCRINE A:   DM  P:   SSI Holding metformin  NEUROLOGIC A:   Acute metabolic encephalopathy  P:   RASS goal: -1 Propofol infusion CT head  FAMILY  - Updates: family has left and are unavailable   - Inter-disciplinary family meet or Palliative Care meeting due by:  6/12   Joneen RoachPaul ,  AGACNP-BC [facility] Pager [phone] or [phone]  09/22/2017 5:08 PM

## 2017-09-23 NOTE — Progress Notes (Signed)
RT assisted in pt transport on vent from ED to 3M05.  No complications during transport.

## 2017-09-24 ENCOUNTER — Inpatient Hospital Stay (HOSPITAL_COMMUNITY): Payer: Medicare HMO

## 2017-09-24 DIAGNOSIS — I340 Nonrheumatic mitral (valve) insufficiency: Secondary | ICD-10-CM

## 2017-09-24 LAB — GLUCOSE, CAPILLARY
GLUCOSE-CAPILLARY: 93 mg/dL (ref 65–99)
Glucose-Capillary: 108 mg/dL — ABNORMAL HIGH (ref 65–99)
Glucose-Capillary: 111 mg/dL — ABNORMAL HIGH (ref 65–99)
Glucose-Capillary: 86 mg/dL (ref 65–99)
Glucose-Capillary: 98 mg/dL (ref 65–99)

## 2017-09-24 LAB — CBC
HCT: 28.7 % — ABNORMAL LOW (ref 39.0–52.0)
Hemoglobin: 9.1 g/dL — ABNORMAL LOW (ref 13.0–17.0)
MCH: 30.3 pg (ref 26.0–34.0)
MCHC: 31.7 g/dL (ref 30.0–36.0)
MCV: 95.7 fL (ref 78.0–100.0)
PLATELETS: 80 10*3/uL — AB (ref 150–400)
RBC: 3 MIL/uL — AB (ref 4.22–5.81)
RDW: 15.4 % (ref 11.5–15.5)
WBC: 12 10*3/uL — ABNORMAL HIGH (ref 4.0–10.5)

## 2017-09-24 LAB — BASIC METABOLIC PANEL
Anion gap: 10 (ref 5–15)
BUN: 27 mg/dL — AB (ref 6–20)
CALCIUM: 7.1 mg/dL — AB (ref 8.9–10.3)
CO2: 17 mmol/L — ABNORMAL LOW (ref 22–32)
CREATININE: 1.78 mg/dL — AB (ref 0.61–1.24)
Chloride: 118 mmol/L — ABNORMAL HIGH (ref 101–111)
GFR calc Af Amer: 40 mL/min — ABNORMAL LOW (ref >60)
GFR, EST NON AFRICAN AMERICAN: 34 mL/min — AB (ref >60)
Glucose, Bld: 94 mg/dL (ref 65–99)
Potassium: 4.1 mmol/L (ref 3.5–5.1)
SODIUM: 145 mmol/L (ref 135–145)

## 2017-09-24 LAB — ECHOCARDIOGRAM COMPLETE
Height: 70.5 in
Weight: 2804.25 oz

## 2017-09-24 LAB — TROPONIN I
Troponin I: 0.27 ng/mL (ref <0.03)
Troponin I: 0.28 ng/mL (ref <0.03)

## 2017-09-24 LAB — MAGNESIUM: Magnesium: 1.1 mg/dL — ABNORMAL LOW (ref 1.7–2.4)

## 2017-09-24 LAB — PHOSPHORUS: Phosphorus: 3.2 mg/dL (ref 2.5–4.6)

## 2017-09-24 LAB — LACTIC ACID, PLASMA: Lactic Acid, Venous: 1.6 mmol/L (ref 0.5–1.9)

## 2017-09-24 MED ORDER — FAMOTIDINE IN NACL 20-0.9 MG/50ML-% IV SOLN
20.0000 mg | INTRAVENOUS | Status: DC
  Administered 2017-09-25 – 2017-09-29 (×5): 20 mg via INTRAVENOUS
  Filled 2017-09-24 (×5): qty 50

## 2017-09-24 MED ORDER — PERFLUTREN LIPID MICROSPHERE
INTRAVENOUS | Status: AC
Start: 2017-09-24 — End: 2017-09-25
  Filled 2017-09-24: qty 10

## 2017-09-24 MED ORDER — NOREPINEPHRINE 4 MG/250ML-% IV SOLN
0.0000 ug/min | INTRAVENOUS | Status: DC
Start: 2017-09-24 — End: 2017-09-24
  Filled 2017-09-24: qty 250

## 2017-09-24 MED ORDER — SODIUM CHLORIDE 0.9 % IV BOLUS
1000.0000 mL | Freq: Once | INTRAVENOUS | Status: AC
  Administered 2017-09-24: 1000 mL via INTRAVENOUS

## 2017-09-24 MED ORDER — SODIUM CHLORIDE 0.45 % IV SOLN
INTRAVENOUS | Status: DC
  Administered 2017-09-24 – 2017-09-28 (×3): via INTRAVENOUS

## 2017-09-24 MED ORDER — PERFLUTREN LIPID MICROSPHERE
1.0000 mL | INTRAVENOUS | Status: AC | PRN
  Administered 2017-09-24: 2 mL via INTRAVENOUS
  Filled 2017-09-24: qty 10

## 2017-09-24 MED ORDER — PROPOFOL 500 MG/50ML IV EMUL
INTRAVENOUS | Status: AC
  Filled 2017-09-24: qty 50

## 2017-09-24 MED ORDER — MAGNESIUM SULFATE 2 GM/50ML IV SOLN
2.0000 g | Freq: Once | INTRAVENOUS | Status: AC
  Administered 2017-09-24: 2 g via INTRAVENOUS
  Filled 2017-09-24: qty 50

## 2017-09-24 NOTE — Progress Notes (Signed)
eLink Physician-Brief Progress Note Patient Name: Johnny NewcomerDan E Hamor DOB: [birth year] MRN: [id number]   Date of Service  09/24/2017  HPI/Events of Note  Hypomagnesemia  eICU Interventions  Magnesium sulfate 2 g iv x 1        Raylyn Carton U B[place]ton Vanscyoc 09/24/2017, 6:51 AM

## 2017-09-24 NOTE — Progress Notes (Signed)
  Echocardiogram 2D Echocardiogram has been performed.  Roosvelt MaserLane, Shalva Rozycki F 09/24/2017, 2:08 PM

## 2017-09-24 NOTE — Progress Notes (Signed)
eLink Physician-Brief Progress Note Patient Name: Jarvis NewcomerDan E Cupo DOB: 11-25-1934 MRN: [id number]   Date of Service  09/24/2017  HPI/Events of Note  Hypotension related to septic shock  eICU Interventions  1000 ml iv NS bolus to approximate 30 ml/ kg sepsis fluid resuscitation goal Norepinephrine infusion renewal        Rashell Shambaugh U Yasuo Phimmasone 09/24/2017, 3:12 AM

## 2017-09-24 NOTE — Progress Notes (Signed)
Witnessed waste of IV fentanyl this am during bedside report with Dance movement psychotherapistKanisha RN. 25mcg IV fentanyl given per prn order. 75mcg wasted in sharps. Verified/Completed this with charge RN in pyxis.
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
## 2017-09-25 ENCOUNTER — Inpatient Hospital Stay (HOSPITAL_COMMUNITY): Payer: Medicare HMO

## 2017-09-25 DIAGNOSIS — J189 Pneumonia, unspecified organism: Secondary | ICD-10-CM

## 2017-09-25 LAB — BASIC METABOLIC PANEL
Anion gap: 9 (ref 5–15)
BUN: 35 mg/dL — ABNORMAL HIGH (ref 6–20)
CHLORIDE: 115 mmol/L — AB (ref 101–111)
CO2: 18 mmol/L — AB (ref 22–32)
CREATININE: 1.95 mg/dL — AB (ref 0.61–1.24)
Calcium: 8.3 mg/dL — ABNORMAL LOW (ref 8.9–10.3)
GFR calc Af Amer: 36 mL/min — ABNORMAL LOW (ref >60)
GFR calc non Af Amer: 31 mL/min — ABNORMAL LOW (ref >60)
Glucose, Bld: 85 mg/dL (ref 65–99)
Potassium: 4.2 mmol/L (ref 3.5–5.1)
Sodium: 142 mmol/L (ref 135–145)

## 2017-09-25 LAB — CBC
HCT: 33.1 % — ABNORMAL LOW (ref 39.0–52.0)
HEMOGLOBIN: 10.6 g/dL — AB (ref 13.0–17.0)
MCH: 31.2 pg (ref 26.0–34.0)
MCHC: 32 g/dL (ref 30.0–36.0)
MCV: 97.4 fL (ref 78.0–100.0)
Platelets: 87 10*3/uL — ABNORMAL LOW (ref 150–400)
RBC: 3.4 MIL/uL — ABNORMAL LOW (ref 4.22–5.81)
RDW: 15.9 % — ABNORMAL HIGH (ref 11.5–15.5)
WBC: 10.9 10*3/uL — ABNORMAL HIGH (ref 4.0–10.5)

## 2017-09-25 LAB — GLUCOSE, CAPILLARY
GLUCOSE-CAPILLARY: 71 mg/dL (ref 65–99)
GLUCOSE-CAPILLARY: 81 mg/dL (ref 65–99)
GLUCOSE-CAPILLARY: 86 mg/dL (ref 65–99)
GLUCOSE-CAPILLARY: 86 mg/dL (ref 65–99)
Glucose-Capillary: 69 mg/dL (ref 65–99)
Glucose-Capillary: 108 mg/dL — ABNORMAL HIGH (ref 65–99)
Glucose-Capillary: 114 mg/dL — ABNORMAL HIGH (ref 65–99)

## 2017-09-25 LAB — TROPONIN I
Troponin I: 0.18 ng/mL (ref <0.03)
Troponin I: 0.17 ng/mL (ref <0.03)

## 2017-09-25 LAB — PROCALCITONIN: Procalcitonin: 0.57 ng/mL

## 2017-09-25 LAB — MAGNESIUM
Magnesium: 1.9 mg/dL (ref 1.7–2.4)
Magnesium: 1.8 mg/dL (ref 1.7–2.4)
Magnesium: 1.8 mg/dL (ref 1.7–2.4)

## 2017-09-25 LAB — PHOSPHORUS
PHOSPHORUS: 2.9 mg/dL (ref 2.5–4.6)
Phosphorus: 2.9 mg/dL (ref 2.5–4.6)

## 2017-09-25 LAB — STREP PNEUMONIAE URINARY ANTIGEN: STREP PNEUMO URINARY ANTIGEN: NEGATIVE

## 2017-09-25 MED ORDER — DEXMEDETOMIDINE HCL IN NACL 400 MCG/100ML IV SOLN
0.0000 ug/kg/h | INTRAVENOUS | Status: AC
Start: 2017-09-25 — End: 2017-10-05
  Administered 2017-09-25 (×2): 0.6 ug/kg/h via INTRAVENOUS
  Administered 2017-09-26: 1.2 ug/kg/h via INTRAVENOUS
  Administered 2017-09-26: 0.8 ug/kg/h via INTRAVENOUS
  Administered 2017-09-26: 1 ug/kg/h via INTRAVENOUS
  Administered 2017-09-26: 0.7 ug/kg/h via INTRAVENOUS
  Administered 2017-09-27 – 2017-09-28 (×8): 1.2 ug/kg/h via INTRAVENOUS
  Administered 2017-09-28: 1.6 ug/kg/h via INTRAVENOUS
  Administered 2017-09-28: 1.5 ug/kg/h via INTRAVENOUS
  Administered 2017-09-28 (×2): 1.6 ug/kg/h via INTRAVENOUS
  Administered 2017-09-28: 1.2 ug/kg/h via INTRAVENOUS
  Administered 2017-09-29 – 2017-09-30 (×7): 1.6 ug/kg/h via INTRAVENOUS
  Administered 2017-09-30: 1 ug/kg/h via INTRAVENOUS
  Administered 2017-09-30: 1.6 ug/kg/h via INTRAVENOUS
  Administered 2017-09-30: 1 ug/kg/h via INTRAVENOUS
  Administered 2017-09-30 (×2): 1.6 ug/kg/h via INTRAVENOUS
  Administered 2017-10-01: 1.4 ug/kg/h via INTRAVENOUS
  Administered 2017-10-01: 1.2 ug/kg/h via INTRAVENOUS
  Administered 2017-10-01: 1 ug/kg/h via INTRAVENOUS
  Administered 2017-10-01: 1.4 ug/kg/h via INTRAVENOUS
  Administered 2017-10-01 – 2017-10-04 (×10): 1 ug/kg/h via INTRAVENOUS
  Administered 2017-10-04 (×2): 0.8 ug/kg/h via INTRAVENOUS
  Administered 2017-10-04: 1 ug/kg/h via INTRAVENOUS
  Administered 2017-10-05 (×2): 0.9 ug/kg/h via INTRAVENOUS
  Filled 2017-09-25 (×5): qty 100
  Filled 2017-09-25 (×2): qty 200
  Filled 2017-09-25 (×6): qty 100
  Filled 2017-09-25: qty 200
  Filled 2017-09-25 (×12): qty 100
  Filled 2017-09-25: qty 200
  Filled 2017-09-25 (×3): qty 100
  Filled 2017-09-25: qty 200
  Filled 2017-09-25 (×2): qty 100
  Filled 2017-09-25: qty 300
  Filled 2017-09-25 (×12): qty 100

## 2017-09-25 MED ORDER — FENTANYL CITRATE (PF) 100 MCG/2ML IJ SOLN
50.0000 ug | INTRAMUSCULAR | Status: DC | PRN
  Administered 2017-09-27 – 2017-09-28 (×2): 50 ug via INTRAVENOUS
  Filled 2017-09-25 (×4): qty 2

## 2017-09-25 MED ORDER — VITAL AF 1.2 CAL PO LIQD
1000.0000 mL | ORAL | Status: DC
  Administered 2017-09-25 – 2017-09-27 (×3): 1000 mL
  Filled 2017-09-25 (×4): qty 1000

## 2017-09-25 MED ORDER — FUROSEMIDE 10 MG/ML IJ SOLN
40.0000 mg | Freq: Once | INTRAMUSCULAR | Status: AC
  Administered 2017-09-25: 40 mg via INTRAVENOUS
  Filled 2017-09-25: qty 4

## 2017-09-25 MED ORDER — VITAMIN B-1 100 MG PO TABS
100.0000 mg | ORAL_TABLET | Freq: Every day | ORAL | Status: DC
  Administered 2017-09-25 – 2017-10-25 (×31): 100 mg
  Filled 2017-09-25 (×31): qty 1

## 2017-09-25 MED ORDER — DOCUSATE SODIUM 50 MG/5ML PO LIQD
100.0000 mg | Freq: Two times a day (BID) | ORAL | Status: DC | PRN
  Administered 2017-10-08 – 2017-10-23 (×5): 100 mg
  Filled 2017-09-25 (×5): qty 10

## 2017-09-25 MED ORDER — FENTANYL CITRATE (PF) 100 MCG/2ML IJ SOLN
50.0000 ug | INTRAMUSCULAR | Status: DC | PRN
  Administered 2017-09-26 – 2017-10-07 (×29): 50 ug via INTRAVENOUS
  Filled 2017-09-25 (×27): qty 2

## 2017-09-25 MED ORDER — VITAL HIGH PROTEIN PO LIQD
1000.0000 mL | ORAL | Status: DC
  Filled 2017-09-25 (×2): qty 1000

## 2017-09-25 MED ORDER — VITAL AF 1.2 CAL PO LIQD
1000.0000 mL | ORAL | Status: DC
  Filled 2017-09-25 (×2): qty 1000

## 2017-09-25 MED ORDER — FREE WATER
200.0000 mL | Status: DC
  Administered 2017-09-25 – 2017-10-01 (×32): 200 mL

## 2017-09-25 MED ORDER — FOLIC ACID 1 MG PO TABS
1.0000 mg | ORAL_TABLET | Freq: Every day | ORAL | Status: DC
  Administered 2017-09-25 – 2017-10-25 (×31): 1 mg
  Filled 2017-09-25 (×31): qty 1

## 2017-09-25 MED ORDER — ASPIRIN 81 MG PO CHEW
81.0000 mg | CHEWABLE_TABLET | Freq: Every day | ORAL | Status: DC
  Administered 2017-09-25 – 2017-10-25 (×30): 81 mg
  Filled 2017-09-25 (×32): qty 1

## 2017-09-25 MED ORDER — PRO-STAT SUGAR FREE PO LIQD: 30.0000 mL | Freq: Two times a day (BID) | ORAL | Status: DC

## 2017-09-25 NOTE — Progress Notes (Addendum)
PULMONARY / CRITICAL CARE MEDICINE   Name: Johnny [name] MRN: [id number] DOB: 1934-09-02    ADMISSION DATE:  09-27-17 CONSULTATION DATE:  6/5  REFERRING MD:  Dr. Suzi Roots EDP  CHIEF COMPLAINT:  Shock  HISTORY OF PRESENT ILLNESS:   83 year old male with PMH as below, which is significant for ETOH abuse, CAD s/p CABG, systolic CHF (ICM. LVEF 40%), alcoholic hepatitis, PAF s/p ablation, DM, and cardiac arrest in the setting of withdrawal seizures. He had been in the midst of a several day alcohol binge as reported by his family, but seemed to be doing OK (at baseline in wheelchair). He did complain of productive cough during that time. 6/5 they attempted to give him a bath and he developed significant respiratory distress and alteration in mental status. Upon arrival to the ED he had O2 sats in the 50% and ultimately required intubation. He then became hypotensive prompting IVF resuscitation and eventually CVL placement for pressors. PCCM asked to evaluate for admission.   SUBJECTIVE:   Currently heavily sedated on the ventilator   VITAL SIGNS: Blood Pressure (Abnormal) 142/91   Pulse 96   Temperature 97.9 F (36.6 C)   Respiration 20   Height 5' 10.5" (1.791 m)   Weight 175 lb 4.3 oz (79.5 kg)   Oxygen Saturation 100%   Body Mass Index 24.79 kg/m   HEMODYNAMICS:    VENTILATOR SETTINGS: Vent Mode: PRVC FiO2 (%):  [40 %] 40 % Set Rate:  [14 bmp-18 bmp] 14 bmp Vt Set:  [590 mL] 590 mL PEEP:  [5 cmH20] 5 cmH20 Pressure Support:  [8 cmH20] 8 cmH20 Plateau Pressure:  [19 cmH20-24 cmH20] 21 cmH20  INTAKE / OUTPUT: I/O last 3 completed shifts: In: 3682.1 [I.V.:2631.7; IV Piggyback:1050.4] Out: 500 [Urine:300; Emesis/NG output:200]  PHYSICAL EXAMINATION:  General: This is a 83 year old debilitated male currently heavily sedated on the ventilator HEENT orally intubated, membranes moist.  No clear jugular venous distention Cardiac: Regular rate and rhythm Pulmonary: Scattered  rhonchi equal chest rise on ventilator episodes of apnea noted on pressure support attempt, however he is on a propofol infusion Abdomen: Soft nontender no organomegaly Extremities: Warm brisk cap refill no significant edema Neuro: Heavily sedated GU: Clear yellow.  LABS:  BMET Recent Labs  Lab 09/27/17 1956 09/24/17 0431 09/25/17 0538  NA 140 145 142  K 5.1 4.1 4.2  CL 112* 118* 115*  CO2 18* 17* 18*  BUN 24* 27* 35*  CREATININE 1.61* 1.78* 1.95*  GLUCOSE 162* 94 85    Electrolytes Recent Labs  Lab 09-27-17 1956 09/24/17 0431 09/25/17 0538  CALCIUM 7.9* 7.1* 8.3*  MG  --  1.1* 1.9  PHOS  --  3.2  --     CBC Recent Labs  Lab 09/27/17 1634 09/24/17 0431 09/25/17 0538  WBC 7.5 12.0* 10.9*  HGB 9.9* 9.1* 10.6*  HCT 31.6* 28.7* 33.1*  PLT 98* 80* 87*    Coag's No results for input(s): APTT, INR in the last 168 hours.  Sepsis Markers Recent Labs  Lab 09/27/2017 1634 27-Sep-2017 1635 09-27-2017 1956 09/24/17 1330  LATICACIDVEN  --  4.3* 3.5* 1.6  PROCALCITON 0.19  --   --   --     ABG Recent Labs  Lab 09/27/2017 1641 2017-09-27 1857  PHART 7.283* 7.399  PCO2ART 35.6 23.9*  PO2ART 23.0* 477.0*    Liver Enzymes Recent Labs  Lab 09/27/2017 1416  AST 69*  ALT 26  ALKPHOS 185*  BILITOT 2.7*  ALBUMIN  3.5    Cardiac Enzymes Recent Labs  Lab 09/24/17 0431 09/24/17 1330 09/25/17 0538  TROPONINI 0.27* 0.28* 0.17*    Glucose Recent Labs  Lab 09/24/17 2010 09/25/17 0045 09/25/17 0446 09/25/17 0738 09/25/17 1133 09/25/17 1203  GLUCAP 98 86 69 86 71 108*    Imaging Dg Chest Port 1 View  Result Date: 09/25/2017 CLINICAL DATA:  Follow-up pneumonia. EXAM: PORTABLE CHEST 1 VIEW COMPARISON:  Chest radiograph performed 09/24/2017 FINDINGS: The patient's endotracheal tube is seen ending 2 cm above the carina. An enteric tube is noted extending below the diaphragm. The right IJ line is noted ending about the distal SVC. Worsening bibasilar and left  perihilar airspace opacification raises concern for worsening pneumonia. Underlying small bilateral pleural effusions are noted. No pneumothorax is seen. The cardiomediastinal silhouette is normal in size. The patient is status post median sternotomy, with evidence of prior CABG. No acute osseous abnormalities are identified. IMPRESSION: 1. Endotracheal tube seen ending 2 cm above the carina. 2. Worsening bibasilar and left perihilar airspace opacification raises concern for worsening pneumonia. Underlying small bilateral pleural effusions noted. Electronically Signed   By: Roanna RaiderJeffery  Chang M.D.   On: 09/25/2017 03:33    STUDIES:    CULTURES: Blood cultures 6/5 > Tracheal aspiration  6/5 >  ANTIBIOTICS: Cefepime 6/5 > Vanco 6/5 >  SIGNIFICANT EVENTS: 6/5 admit  LINES/TUBES: ETT 6/5 > RIJ CVL 6/5 >  DISCUSSION: 83 year old male with ETOH abuse admitted after several day ETOH binge presenting hypoxic. Felt to have aspirated. Intubated and started on pressors in ED. Pressor weaned off quickly with volume administration. Once BP normalized he was more awake and able to tolerate some SBT with PS 10.   ASSESSMENT / PLAN:  Acute hypoxemic respiratory failure in setting of CAP vs aspiration  pcxr personally reviewed: ETT and CVL in satisfactory position. R>L airspace disease persists. Bibasilar aeration looks worse when c/w prior film.  May be an element of volume excess Plan Cont full ventilator support, daily spontaneous breathing trials PAD protocol, changing RASS goal to 0 to -1 Discontinue propofol infusion changed to Precedex with PRN fentanyl Lasix x1 Day # 3 vanc and cefepime PCT algo  Check urine strep antigen  Shock (sepsis and hypovolemia), c/b h/o: Ischemic cardiomyopathy, CAD s/p CABG Hx HTN, HLD, Aflutter s/p ablation Troponin elevation d/t demand ischemia  -pressor requirements: Resolved -LA cleared  -His left ventricular ejection fraction has declined further, now EF  20 to 25% with diffuse hypokinesis. Plan Continue telemetry monitoring Lasix x1 KVO IV fluids   AKI; suspect hemodynamically mediated -scr climbing -uop:  Plan   Fluid and Electrolyte imbalance: NAG metabolic acidosis, in setting of hyperchloremia -improving w/ 1/2 saline  Plan Cont IVFs, 1/2 [facility] Trend CMP Replace lytes as needed.  Add free water via tube   Acute metabolic encephalopathy: - CT head wnl Plan  Anemia and thrombocytopenia. Both chronic.  -plts holding  -hgb stable; no evidence of bleeding Plan Trend CBC Transfuse per protocol for Hgb < 7    DM -glycemic control acceptable  Plan Cont ssi  Holding metformin    FAMILY  - Updates:   - Inter-disciplinary family meet or Palliative Care meeting due by:  6/12  DVT prophylaxis: IV heparin  SUP: pepcid  Diet: tubefeed Activity: BR Disposition : icu  Simonne MartinetPeter E Babcock ACNP-BC [facility] Pulmonary/Critical Care Pager # [phone] OR # [phone] if no answer  Attending Note:  83 year old alcoholic male presenting  with CAP vs aspiration pneumonia with associated septic shock and respiratory failure.  On exam, diffuse crackles noted.  No events overnight, more interactive when off sedation.  I reviewed CXR myself, ETT is in good position with R>L infiltrate noted.  Discussed with PCCM-NP.  Will d/c propofol and place on precedex.  Low dose diureses today since patient is off pressors.  Will continue full vent support for now.  Replace electrolytes as indicated.  BMET in AM.  PCCM will continue to follow.  The patient is critically ill with multiple organ systems failure and requires high complexity decision making for assessment and support, frequent evaluation and titration of therapies, application of advanced monitoring technologies and extensive interpretation of multiple databases.   Critical Care Time devoted to patient care services described in this note is  34  Minutes. This time reflects time of  care of this signee Dr Koren Bound. This critical care time does not reflect procedure time, or teaching time or supervisory time of PA/NP/Med student/Med Resident etc but could involve care discussion time.  Alyson Reedy, M.D. [facility] Pulmonary/Critical Care Medicine. Pager: [phone]. After hours pager: [phone].

## 2017-09-25 NOTE — Progress Notes (Addendum)
Initial Nutrition Assessment  DOCUMENTATION CODES:   Not applicable  INTERVENTION:   Vital AF 1.2 @ 40 ml/hr Increase by 10 ml/hr q 8 hours to goal of 60 ml/hr  Provides: 1728 kcals, 108 grams protein, 1168 ml free water. Meets 102% of calorie needs and 100% of protein needs.   NUTRITION DIAGNOSIS:   Inadequate oral intake related to inability to eat as evidenced by estimated needs.  GOAL:   Patient will meet greater than or equal to 90% of their needs  MONITOR:   Diet advancement, Vent status, Weight trends, Labs, I & O's, TF tolerance  REASON FOR ASSESSMENT:   Consult Enteral/tube feeding initiation and management  ASSESSMENT:   Patient with PMH significant for ETOH abuse, CAD s/p CABG, CHF, alcoholic hepatitis, PAF s/p ablation, DM, and cardiac arrest in setting of withdrawals. Presents this admission after a several day alcohol binge. Admitted for acute metabolic encephalopathy and respiratory failure requiring intubation.    6/5- tracheal aspiration  Pt off pressors. Tolerating SBT.  Plan is to change propofol to Precedex this afternoon.  Pt has OGT in place. Order in for cortrak. Possible placement tomorrow.  No family at bedside to provide nutrition history.   Patient is currently intubated on ventilator support MV: 10.6L/min Temp (24hrs), Avg:96.8 F (36 C), Min:95.7 F (35.4 C), Max:97.9 F (36.6 C) Propofol: 9.718ml/hr- to be d/c today  Records show weight fluctuations between 168 lb- 175 lb over the last year.  Nutrition-Focused physical exam completed.   I/O: + 4002 ml since admit UOP: 350 ml x 24 hrs  Medications reviewed and include: folic acid, 40 mg lasix once, thiamine, propofol Labs reviewed.   NUTRITION - FOCUSED PHYSICAL EXAM:    Most Recent Value  Orbital Region  No depletion  Upper Arm Region  Mild depletion  Thoracic and Lumbar Region  Unable to assess  Buccal Region  No depletion  Temple Region  Mild depletion  Clavicle Bone  Region  Moderate depletion  Clavicle and Acromion Bone Region  Moderate depletion  Scapular Bone Region  Unable to assess  Dorsal Hand  Unable to assess [mits]  Patellar Region  Mild depletion  Anterior Thigh Region  Mild depletion  Posterior Calf Region  Mild depletion  Edema (RD Assessment)  None  Hair  Reviewed  Eyes  Unable to assess  Mouth  Unable to assess  Skin  Reviewed  Nails  Unable to assess     Diet Order:   Diet Order           Diet NPO time specified  Diet effective now          EDUCATION NEEDS:   Not appropriate for education at this time  Skin:  Skin Assessment: Reviewed RN Assessment  Last BM:  09/24/17  Height:   Ht Readings from Last 1 Encounters:  July 28, 2017 5' 10.5" (1.791 m)    Weight:   Wt Readings from Last 1 Encounters:  09/24/17 175 lb 4.3 oz (79.5 kg)    Ideal Body Weight:  75.5 kg  BMI:  Body mass index is 24.79 kg/m.  Estimated Nutritional Needs:   Kcal:  1687 kcal  Protein:  95-115 grams  Fluid:  >1.6 L/day    Vanessa Kickarly Derica Leiber RD, LDN Clinical Nutrition Pager # - [phone]

## 2017-09-26 ENCOUNTER — Inpatient Hospital Stay (HOSPITAL_COMMUNITY): Payer: Medicare HMO

## 2017-09-26 LAB — COMPREHENSIVE METABOLIC PANEL
ALBUMIN: 2.6 g/dL — AB (ref 3.5–5.0)
ALT: 86 U/L — AB (ref 17–63)
AST: 90 U/L — AB (ref 15–41)
Alkaline Phosphatase: 141 U/L — ABNORMAL HIGH (ref 38–126)
Anion gap: 11 (ref 5–15)
BUN: 39 mg/dL — AB (ref 6–20)
CALCIUM: 8.5 mg/dL — AB (ref 8.9–10.3)
CHLORIDE: 111 mmol/L (ref 101–111)
CO2: 17 mmol/L — AB (ref 22–32)
Creatinine, Ser: 1.73 mg/dL — ABNORMAL HIGH (ref 0.61–1.24)
GFR calc non Af Amer: 36 mL/min — ABNORMAL LOW (ref >60)
GFR, EST AFRICAN AMERICAN: 41 mL/min — AB (ref >60)
Glucose, Bld: 242 mg/dL — ABNORMAL HIGH (ref 65–99)
Potassium: 3.6 mmol/L (ref 3.5–5.1)
SODIUM: 139 mmol/L (ref 135–145)
TOTAL PROTEIN: 6.3 g/dL — AB (ref 6.5–8.1)
Total Bilirubin: 1.3 mg/dL — ABNORMAL HIGH (ref 0.3–1.2)

## 2017-09-26 LAB — CBC
HCT: 36.3 % — ABNORMAL LOW (ref 39.0–52.0)
Hemoglobin: 11.3 g/dL — ABNORMAL LOW (ref 13.0–17.0)
MCH: 30.1 pg (ref 26.0–34.0)
MCHC: 31.1 g/dL (ref 30.0–36.0)
MCV: 96.5 fL (ref 78.0–100.0)
PLATELETS: 99 10*3/uL — AB (ref 150–400)
RBC: 3.76 MIL/uL — AB (ref 4.22–5.81)
RDW: 15.9 % — ABNORMAL HIGH (ref 11.5–15.5)
WBC: 8.8 10*3/uL (ref 4.0–10.5)

## 2017-09-26 LAB — GLUCOSE, CAPILLARY
Glucose-Capillary: 120 mg/dL — ABNORMAL HIGH (ref 65–99)
Glucose-Capillary: 151 mg/dL — ABNORMAL HIGH (ref 65–99)
Glucose-Capillary: 167 mg/dL — ABNORMAL HIGH (ref 65–99)
Glucose-Capillary: 176 mg/dL — ABNORMAL HIGH (ref 65–99)
Glucose-Capillary: 187 mg/dL — ABNORMAL HIGH (ref 65–99)
Glucose-Capillary: 234 mg/dL — ABNORMAL HIGH (ref 65–99)
Glucose-Capillary: 243 mg/dL — ABNORMAL HIGH (ref 65–99)

## 2017-09-26 LAB — MAGNESIUM
MAGNESIUM: 1.7 mg/dL (ref 1.7–2.4)
MAGNESIUM: 1.5 mg/dL — AB (ref 1.7–2.4)

## 2017-09-26 LAB — PHOSPHORUS
PHOSPHORUS: 3.3 mg/dL (ref 2.5–4.6)
PHOSPHORUS: 2.7 mg/dL (ref 2.5–4.6)

## 2017-09-26 LAB — PROCALCITONIN: PROCALCITONIN: 0.45 ng/mL

## 2017-09-26 LAB — TRIGLYCERIDES: Triglycerides: 50 mg/dL (ref <150)

## 2017-09-26 MED ORDER — INSULIN GLARGINE 100 UNIT/ML ~~LOC~~ SOLN
5.0000 [IU] | Freq: Every day | SUBCUTANEOUS | Status: DC
  Administered 2017-09-26 – 2017-10-09 (×14): 5 [IU] via SUBCUTANEOUS
  Filled 2017-09-26 (×14): qty 0.05

## 2017-09-26 MED ORDER — INSULIN ASPART 100 UNIT/ML ~~LOC~~ SOLN
0.0000 [IU] | SUBCUTANEOUS | Status: DC
  Administered 2017-09-26 (×2): 3 [IU] via SUBCUTANEOUS
  Administered 2017-09-26: 5 [IU] via SUBCUTANEOUS
  Administered 2017-09-27: 2 [IU] via SUBCUTANEOUS
  Administered 2017-09-27: 3 [IU] via SUBCUTANEOUS
  Administered 2017-09-27 (×2): 2 [IU] via SUBCUTANEOUS
  Administered 2017-09-27: 3 [IU] via SUBCUTANEOUS
  Administered 2017-09-27: 2 [IU] via SUBCUTANEOUS
  Administered 2017-09-28 (×2): 3 [IU] via SUBCUTANEOUS
  Administered 2017-09-28 – 2017-09-29 (×4): 5 [IU] via SUBCUTANEOUS
  Administered 2017-09-29 – 2017-09-30 (×7): 3 [IU] via SUBCUTANEOUS
  Administered 2017-10-01 (×2): 5 [IU] via SUBCUTANEOUS
  Administered 2017-10-01: 2 [IU] via SUBCUTANEOUS
  Administered 2017-10-01 – 2017-10-02 (×4): 3 [IU] via SUBCUTANEOUS
  Administered 2017-10-02: 2 [IU] via SUBCUTANEOUS
  Administered 2017-10-02: 3 [IU] via SUBCUTANEOUS
  Administered 2017-10-02 (×2): 5 [IU] via SUBCUTANEOUS
  Administered 2017-10-02: 3 [IU] via SUBCUTANEOUS
  Administered 2017-10-03: 4 [IU] via SUBCUTANEOUS
  Administered 2017-10-03 (×5): 3 [IU] via SUBCUTANEOUS
  Administered 2017-10-03: 5 [IU] via SUBCUTANEOUS
  Administered 2017-10-04 (×2): 3 [IU] via SUBCUTANEOUS
  Administered 2017-10-04 (×2): 2 [IU] via SUBCUTANEOUS
  Administered 2017-10-04 – 2017-10-05 (×5): 3 [IU] via SUBCUTANEOUS
  Administered 2017-10-05: 5 [IU] via SUBCUTANEOUS
  Administered 2017-10-06: 2 [IU] via SUBCUTANEOUS

## 2017-09-26 MED ORDER — FUROSEMIDE 10 MG/ML IJ SOLN
40.0000 mg | Freq: Once | INTRAMUSCULAR | Status: AC
  Administered 2017-09-26: 40 mg via INTRAVENOUS
  Filled 2017-09-26: qty 4

## 2017-09-26 MED ORDER — INSULIN ASPART 100 UNIT/ML ~~LOC~~ SOLN
0.0000 [IU] | SUBCUTANEOUS | Status: DC
  Administered 2017-09-26: 3 [IU] via SUBCUTANEOUS

## 2017-09-26 NOTE — Progress Notes (Signed)
Cortrak Tube Team Note:  Consult received to place a Cortrak feeding tube.   A 10 F Cortrak tube was placed in the R nare and secured with a nasal bridle at 98 cm. Per the Cortrak monitor reading the tube tip is  Postpyloric, approximately d1-d2  Placement difficult and prolonged due to existing OGT in place.   Due to need for postpyloric access X-ray is required, abdominal x-ray has been ordered by the Cortrak team. Please confirm tube placement before using the Cortrak tube.   If the tube becomes dislodged please keep the tube and contact the Cortrak team at [email or web address] (password [email or web address]) for replacement.  If after hours and replacement cannot be delayed, place a NG tube and confirm placement with an abdominal x-ray.   Johnny [name] RD, LDN, CNSC Clinical Nutrition Available Tues-Sat via Pager: [phone] 09/26/2017 10:00 AM

## 2017-09-26 NOTE — Progress Notes (Signed)
PULMONARY / CRITICAL CARE MEDICINE   Name: Johnny [name] MRN: [id number] DOB: 02/25/1935    ADMISSION DATE:  10/12/2017 CONSULTATION DATE:  6/5  REFERRING MD:  Dr. Suzi RootsMilled EDP  CHIEF COMPLAINT:  Shock  HISTORY OF PRESENT ILLNESS:   83 year old male with PMH as below, which is significant for ETOH abuse, CAD s/p CABG, systolic CHF (ICM. LVEF 40%), alcoholic hepatitis, PAF s/p ablation, DM, and cardiac arrest in the setting of withdrawal seizures. He had been in the midst of a several day alcohol binge as reported by his family, but seemed to be doing OK (at baseline in wheelchair). He did complain of productive cough during that time. 6/5 they attempted to give him a bath and he developed significant respiratory distress and alteration in mental status. Upon arrival to the ED he had O2 sats in the 50% and ultimately required intubation. He then became hypotensive prompting IVF resuscitation and eventually CVL placement for pressors. PCCM asked to evaluate for admission.   SUBJECTIVE:   Sedated with fentanyl Precedex but reported to awaken follow commands   VITAL SIGNS: BP (!) 159/83   Pulse 64   Temp (!) 96.4 F (35.8 C)   Resp 14   Ht 5' 10.5" (1.791 m)   Wt 80.7 kg (177 lb 14.6 oz)   SpO2 97%   BMI 25.17 kg/m   HEMODYNAMICS:    VENTILATOR SETTINGS: Vent Mode: PRVC FiO2 (%):  [40 %] 40 % Set Rate:  [14 bmp] 14 bmp Vt Set:  [590 mL] 590 mL PEEP:  [5 cmH20] 5 cmH20 Plateau Pressure:  [20 cmH20-22 cmH20] 20 cmH20  INTAKE / OUTPUT: I/O last 3 completed shifts: In: 2730.1 [I.V.:1454.6; Other:200; NG/GT:425.5; IV Piggyback:650] Out: 3175 [Urine:3175]  PHYSICAL EXAMINATION:  General: Frail elderly male who has evidence of a previous tracheostomy HEENT: Trach scar is noted, endotracheal tube to ventilator, orogastric tube with tube feedings Neuro: Currently sedated but reported to wake up and follow commands CV: Sounds are regular PULM: Coarse rhonchi bilaterally JY:NWGNGI:soft,  non-tender, bsx4 active  Extremities: warm/dry, 1+ edema  Skin: no rashes or lesions  LABS:  BMET Recent Labs  Lab 09/24/17 0431 09/25/17 0538 09/26/17 0521  NA 145 142 139  K 4.1 4.2 3.6  CL 118* 115* 111  CO2 17* 18* 17*  BUN 27* 35* 39*  CREATININE 1.78* 1.95* 1.73*  GLUCOSE 94 85 242*    Electrolytes Recent Labs  Lab 09/24/17 0431 09/25/17 0538 09/25/17 1650 09/25/17 1808 09/26/17 0521  CALCIUM 7.1* 8.3*  --   --  8.5*  MG 1.1* 1.9 1.8 1.8 1.7  PHOS 3.2  --  2.9 2.9 3.3    CBC Recent Labs  Lab 09/24/17 0431 09/25/17 0538 09/26/17 0521  WBC 12.0* 10.9* 8.8  HGB 9.1* 10.6* 11.3*  HCT 28.7* 33.1* 36.3*  PLT 80* 87* 99*    Coag's No results for input(s): APTT, INR in the last 168 hours.  Sepsis Markers Recent Labs  Lab 07-22-17 1634 07-22-17 1635 07-22-17 1956 09/24/17 1330 09/25/17 1808 09/26/17 0521  LATICACIDVEN  --  4.3* 3.5* 1.6  --   --   PROCALCITON 0.19  --   --   --  0.57 0.45    ABG Recent Labs  Lab 07-22-17 1641 07-22-17 1857  PHART 7.283* 7.399  PCO2ART 35.6 23.9*  PO2ART 23.0* 477.0*    Liver Enzymes Recent Labs  Lab 07-22-17 1416 09/26/17 0521  AST 69* 90*  ALT 26 86*  ALKPHOS 185* 141*  BILITOT 2.7* 1.3*  ALBUMIN 3.5 2.6*    Cardiac Enzymes Recent Labs  Lab 09/24/17 0431 09/24/17 1330 09/25/17 0538  TROPONINI 0.27* 0.28* 0.17*    Glucose Recent Labs  Lab 09/25/17 1133 09/25/17 1203 09/25/17 1538 09/25/17 1930 09/26/17 0011 09/26/17 0415  GLUCAP 71 108* 81 114* 176* 243*    Imaging No results found.  STUDIES:    CULTURES: Blood cultures 6/5 > Tracheal aspiration  6/5 >  ANTIBIOTICS: Cefepime 6/5 > Vanco 6/5 >  SIGNIFICANT EVENTS: 6/5 admit  LINES/TUBES: ETT 6/5 > RIJ CVL 6/5 >  DISCUSSION: 83 year old male with ETOH abuse admitted after several day ETOH binge presenting hypoxic. Felt to have aspirated. Intubated and started on pressors in ED. Pressor weaned off quickly with  volume administration.  Sedation been changed to Precedex weaning on a daily basis..   ASSESSMENT / PLAN:  Acute hypoxemic respiratory failure in setting of CAP vs aspiration  pcxr personally reviewed: ETT and CVL in satisfactory position. R>L airspace disease persists. Bibasilar aeration looks worse when c/w prior film.  May be an element of volume excess Plan Full ventilatory support with weaning trials daily Sedation as needed Continue antimicrobial therapy Monitor culture data Bronchodilators  Shock (sepsis and hypovolemia), c/b h/o: Ischemic cardiomyopathy, CAD s/p CABG Hx HTN, HLD, Aflutter s/p ablation Troponin elevation d/t demand ischemia  -pressor requirements: Resolved -LA cleared  -His left ventricular ejection fraction has declined further, now EF 20 to 25% with diffuse hypokinesis. Plan Currently off pressor support Hemodynamically stable as of 09/26/2017   AKI; suspect hemodynamically mediated Lab Results  Component Value Date   CREATININE 1.73 (H) 09/26/2017   CREATININE 1.95 (H) 09/25/2017   CREATININE 1.78 (H) 09/24/2017   CREATININE 1.16 12/08/2014   Recent Labs  Lab 09/24/17 0431 09/25/17 0538 09/26/17 0521  K 4.1 4.2 3.6    Plan Creatinine improving with fluids therefore continue fluid  Fluid and Electrolyte imbalance: NAG metabolic acidosis, in setting of hyperchloremia Resolved Plan Continue IV fluid Free water Monitor electrolytes  Acute metabolic encephalopathy: - CT head wnl Plan 09/26/2017 awake follows commands sedation is been changed to Precedex  Anemia and thrombocytopenia. Both chronic.  Recent Labs    09/25/17 0538 09/26/17 0521  HGB 10.6* 11.3*    -plts holding  -hgb stable; no evidence of bleeding Plan Trend CBC  Transfuse per protocol     DM CBG (last 3)  Recent Labs    09/25/17 1930 09/26/17 0011 09/26/17 0415  GLUCAP 114* 176* 243*     Plan Cont ssi  Holding metformin  May need to add Lantus sliding  scale insulin does not decrease of her trend of glucose    FAMILY  - Updates: 09/26/2017 no family at bedside  - Inter-disciplinary family meet or Palliative Care meeting due by:  6/12  DVT prophylaxis: Subcutaneous heparin SUP: pepcid  Diet: tubefeed Activity: BR Disposition : icu   App cct 45 min  Brett Canales Amayrani Bennick ACNP [facility] PCCM Pager [phone] till 1 pm If no answer page [phone] 09/26/2017, 7:58 AM

## 2017-09-27 ENCOUNTER — Inpatient Hospital Stay (HOSPITAL_COMMUNITY): Payer: Medicare HMO

## 2017-09-27 LAB — PHOSPHORUS: PHOSPHORUS: 2.7 mg/dL (ref 2.5–4.6)

## 2017-09-27 LAB — GLUCOSE, CAPILLARY
GLUCOSE-CAPILLARY: 137 mg/dL — AB (ref 65–99)
GLUCOSE-CAPILLARY: 166 mg/dL — AB (ref 65–99)
Glucose-Capillary: 87 mg/dL (ref 65–99)
Glucose-Capillary: 138 mg/dL — ABNORMAL HIGH (ref 65–99)
Glucose-Capillary: 147 mg/dL — ABNORMAL HIGH (ref 65–99)
Glucose-Capillary: 142 mg/dL — ABNORMAL HIGH (ref 65–99)

## 2017-09-27 LAB — PROCALCITONIN: PROCALCITONIN: 0.35 ng/mL

## 2017-09-27 LAB — MAGNESIUM: Magnesium: 1.4 mg/dL — ABNORMAL LOW (ref 1.7–2.4)

## 2017-09-27 MED ORDER — FUROSEMIDE 10 MG/ML IJ SOLN
40.0000 mg | Freq: Once | INTRAMUSCULAR | Status: AC
  Administered 2017-09-27: 40 mg via INTRAVENOUS
  Filled 2017-09-27: qty 4

## 2017-09-27 NOTE — Progress Notes (Signed)
PULMONARY / CRITICAL CARE MEDICINE   Name: Johnny [name] MRN: [id number] DOB: 1935/03/30    ADMISSION DATE:  10/06/2017 CONSULTATION DATE:  6/5  REFERRING MD:  Dr. Suzi Roots EDP  CHIEF COMPLAINT:  Shock  HISTORY OF PRESENT ILLNESS:   83 year old male with PMH as below, which is significant for ETOH abuse, CAD s/p CABG, systolic CHF (ICM. LVEF 40%), alcoholic hepatitis, PAF s/p ablation, DM, and cardiac arrest in the setting of withdrawal seizures. He had been in the midst of a several day alcohol binge as reported by his family, but seemed to be doing OK (at baseline in wheelchair). He did complain of productive cough during that time. 6/5 they attempted to give him a bath and he developed significant respiratory distress and alteration in mental status. Upon arrival to the ED he had O2 sats in the 50% and ultimately required intubation. He then became hypotensive prompting IVF resuscitation and eventually CVL placement for pressors. PCCM asked to evaluate for admission.   SUBJECTIVE:   Sedated with Precedex follows commands.   VITAL SIGNS: BP (!) 161/62   Pulse 71   Temp 99.3 F (37.4 C)   Resp (!) 26   Ht 5' 10.5" (1.791 m)   Wt 82.3 kg (181 lb 7 oz)   SpO2 100%   BMI 25.67 kg/m   HEMODYNAMICS:    VENTILATOR SETTINGS: Vent Mode: PRVC FiO2 (%):  [35 %-40 %] 35 % Set Rate:  [14 bmp] 14 bmp Vt Set:  [590 mL] 590 mL PEEP:  [5 cmH20] 5 cmH20 Plateau Pressure:  [21 cmH20-25 cmH20] 21 cmH20  INTAKE / OUTPUT: I/O last 3 completed shifts: In: 4405.1 [I.V.:1737.4; NG/GT:1717.7; IV Piggyback:950] Out: 4098 [JXBJY:7829; Emesis/NG output:650]  PHYSICAL EXAMINATION:  General: Frail elderly male who is awake and follows commands HEENT: Tracheal tube in place, gastric tube in place, old tracheostomy scar noted Neuro: Follows some commands CV: Heart sounds are regular PULM: even/non-labored, lungs bilaterally managed FA:OZHY, non-tender, bsx4 active  Extremities: warm/dry, plus edema   Skin: no rashes or lesions   LABS:  BMET Recent Labs  Lab 09/24/17 0431 09/25/17 0538 09/26/17 0521  NA 145 142 139  K 4.1 4.2 3.6  CL 118* 115* 111  CO2 17* 18* 17*  BUN 27* 35* 39*  CREATININE 1.78* 1.95* 1.73*  GLUCOSE 94 85 242*    Electrolytes Recent Labs  Lab 09/24/17 0431 09/25/17 0538  09/26/17 0521 09/26/17 1700 09/27/17 0331  CALCIUM 7.1* 8.3*  --  8.5*  --   --   MG 1.1* 1.9   < > 1.7 1.5* 1.4*  PHOS 3.2  --    < > 3.3 2.7 2.7   < > = values in this interval not displayed.    CBC Recent Labs  Lab 09/24/17 0431 09/25/17 0538 09/26/17 0521  WBC 12.0* 10.9* 8.8  HGB 9.1* 10.6* 11.3*  HCT 28.7* 33.1* 36.3*  PLT 80* 87* 99*    Coag's No results for input(s): APTT, INR in the last 168 hours.  Sepsis Markers Recent Labs  Lab 09/27/2017 1635 10/02/2017 1956 09/24/17 1330 09/25/17 1808 09/26/17 0521 09/27/17 0331  LATICACIDVEN 4.3* 3.5* 1.6  --   --   --   PROCALCITON  --   --   --  0.57 0.45 0.35    ABG Recent Labs  Lab 10/15/2017 1641 10/05/2017 1857  PHART 7.283* 7.399  PCO2ART 35.6 23.9*  PO2ART 23.0* 477.0*    Liver Enzymes Recent Labs  Lab 09/27/2017 1416 09/26/17 0521  AST 69* 90*  ALT 26 86*  ALKPHOS 185* 141*  BILITOT 2.7* 1.3*  ALBUMIN 3.5 2.6*    Cardiac Enzymes Recent Labs  Lab 09/24/17 0431 09/24/17 1330 09/25/17 0538  TROPONINI 0.27* 0.28* 0.17*    Glucose Recent Labs  Lab 09/26/17 1148 09/26/17 1523 09/26/17 2016 09/26/17 2324 09/27/17 0435 09/27/17 0802  GLUCAP 120* 167* 187* 151* 87 138*    Imaging Dg Chest Port 1 View  Result Date: 09/27/2017 CLINICAL DATA:  Respiratory failure EXAM: PORTABLE CHEST 1 VIEW COMPARISON:  Chest radiograph from one day prior. FINDINGS: Endotracheal tube tip is 3.4 cm above the carina. Enteric tubes enter stomach with the tips not seen on this image. Right internal jugular central venous catheter terminates at the cavoatrial junction. Intact sternotomy wires. CABG clips  overlie the mediastinum. Stable cardiomediastinal silhouette with mild cardiomegaly. No pneumothorax. Stable small bilateral pleural effusions with bibasilar atelectasis. No pulmonary edema. IMPRESSION: 1. Support structures as detailed.  No pneumothorax. 2. Stable small bilateral pleural effusions with bibasilar atelectasis. No pulmonary edema. Electronically Signed   By: Delbert PhenixJason A Poff M.D.   On: 09/27/2017 09:33    STUDIES:    CULTURES: Blood cultures 6/5 > Tracheal aspiration  6/5 >  ANTIBIOTICS: Cefepime 6/5 > Vanco 6/5 >  SIGNIFICANT EVENTS: 6/5 admit  LINES/TUBES: ETT 6/5 > RIJ CVL 6/5 >  DISCUSSION: 83 year old male with ETOH abuse admitted after several day ETOH binge presenting hypoxic. Felt to have aspirated. Intubated and started on pressors in ED. Pressor weaned off quickly with volume administration.  Wean as tolerated he may need repeat tracheostomy due to his advanced age and health comorbidities if he remains a full code.  ASSESSMENT / PLAN:  Acute hypoxemic respiratory failure in setting of CAP vs aspiration  pcxr personally reviewed: ETT and CVL in satisfactory position. R>L airspace disease persists. Bibasilar aeration looks worse when c/w prior film.  May be an element of volume excess Plan Wean as tolerated, but he failed weaning trials after becoming tachypneic on pressure support ventilation of 09/26/2017 Daily wake up assessment Note he had a tracheostomy in the past and may again require tracheostomy at 83 years of age and multiple health comorbidities  Shock (sepsis and hypovolemia), c/b h/o: Ischemic cardiomyopathy resolved, CAD s/p CABG Hx HTN, HLD, Aflutter s/p ablation Troponin elevation d/t demand ischemia  -pressor requirements: Resolved -LA cleared  -His left ventricular ejection fraction has declined further, now EF 20 to 25% with diffuse hypokinesis. Plan Off pressor support    AKI; suspect hemodynamically mediated Lab Results  Component  Value Date   CREATININE 1.73 (H) 09/26/2017   CREATININE 1.95 (H) 09/25/2017   CREATININE 1.78 (H) 09/24/2017   CREATININE 1.16 12/08/2014   Recent Labs  Lab 09/24/17 0431 09/25/17 0538 09/26/17 0521  K 4.1 4.2 3.6   Recent Labs  Lab 09/24/17 0431 09/25/17 0538 09/26/17 0521  NA 145 142 139    Plan Continue IV fluids and monitor creatinine  Fluid and Electrolyte imbalance: NAG metabolic acidosis, in setting of hyperchloremia Resolved Plan IV fluids Free water Electrolyte  Acute metabolic encephalopathy: - CT head wnl Plan 09/26/2017 awake follows commands sedation is been changed to Precedex  Anemia and thrombocytopenia. Both chronic.  Recent Labs    09/25/17 0538 09/26/17 0521  HGB 10.6* 11.3*    -plts holding  -hgb stable; no evidence of bleeding Plan Trend CBC  Transfuse per protocol   DM CBG (last  3)  Recent Labs    09/26/17 2324 09/27/17 0435 09/27/17 0802  GLUCAP 151* 87 138*     Plan Continue sliding-scale insulin   FAMILY  - Updates: 09/27/2017 no family at bedside  - Inter-disciplinary family meet or Palliative Care meeting due by:  6/12  DVT prophylaxis: Subcutaneous heparin SUP: pepcid  Diet: tubefeed Activity: BR Disposition : icu   App cct 30 min  Brett Canales Dalyah Pla ACNP [facility] PCCM Pager [phone] till 1 pm If no answer page [phone] 09/27/2017, 10:32 AM

## 2017-09-28 ENCOUNTER — Inpatient Hospital Stay (HOSPITAL_COMMUNITY): Payer: Medicare HMO

## 2017-09-28 LAB — CULTURE, BLOOD (ROUTINE X 2)
Culture: NO GROWTH
Culture: NO GROWTH
SPECIAL REQUESTS: ADEQUATE

## 2017-09-28 LAB — GLUCOSE, CAPILLARY
GLUCOSE-CAPILLARY: 219 mg/dL — AB (ref 65–99)
GLUCOSE-CAPILLARY: 212 mg/dL — AB (ref 65–99)
GLUCOSE-CAPILLARY: 186 mg/dL — AB (ref 65–99)
Glucose-Capillary: 235 mg/dL — ABNORMAL HIGH (ref 65–99)
Glucose-Capillary: 181 mg/dL — ABNORMAL HIGH (ref 65–99)
Glucose-Capillary: 218 mg/dL — ABNORMAL HIGH (ref 65–99)

## 2017-09-28 LAB — BASIC METABOLIC PANEL
Anion gap: 8 (ref 5–15)
BUN: 45 mg/dL — AB (ref 6–20)
CHLORIDE: 105 mmol/L (ref 101–111)
CO2: 20 mmol/L — ABNORMAL LOW (ref 22–32)
Calcium: 7.8 mg/dL — ABNORMAL LOW (ref 8.9–10.3)
Creatinine, Ser: 1.22 mg/dL (ref 0.61–1.24)
GFR calc Af Amer: >60 mL/min (ref >60)
GFR calc non Af Amer: 54 mL/min — ABNORMAL LOW (ref >60)
GLUCOSE: 259 mg/dL — AB (ref 65–99)
POTASSIUM: 3 mmol/L — AB (ref 3.5–5.1)
Sodium: 133 mmol/L — ABNORMAL LOW (ref 135–145)

## 2017-09-28 LAB — CBC
HCT: 33.1 % — ABNORMAL LOW (ref 39.0–52.0)
Hemoglobin: 10.6 g/dL — ABNORMAL LOW (ref 13.0–17.0)
MCH: 30.3 pg (ref 26.0–34.0)
MCHC: 32 g/dL (ref 30.0–36.0)
MCV: 94.6 fL (ref 78.0–100.0)
Platelets: 91 10*3/uL — ABNORMAL LOW (ref 150–400)
RBC: 3.5 MIL/uL — AB (ref 4.22–5.81)
RDW: 15.8 % — ABNORMAL HIGH (ref 11.5–15.5)
WBC: 11.2 10*3/uL — ABNORMAL HIGH (ref 4.0–10.5)

## 2017-09-28 LAB — MAGNESIUM: Magnesium: 1.3 mg/dL — ABNORMAL LOW (ref 1.7–2.4)

## 2017-09-28 LAB — PHOSPHORUS: Phosphorus: 2.8 mg/dL (ref 2.5–4.6)

## 2017-09-28 MED ORDER — POTASSIUM CHLORIDE 20 MEQ/15ML (10%) PO SOLN
30.0000 meq | ORAL | Status: AC
  Administered 2017-09-28 (×2): 30 meq
  Filled 2017-09-28 (×2): qty 30

## 2017-09-28 MED ORDER — VITAL AF 1.2 CAL PO LIQD
1000.0000 mL | ORAL | Status: DC
  Administered 2017-09-28 – 2017-09-30 (×3): 1000 mL
  Filled 2017-09-28 (×5): qty 1000

## 2017-09-28 MED ORDER — POTASSIUM CHLORIDE 10 MEQ/50ML IV SOLN
10.0000 meq | INTRAVENOUS | Status: AC
  Administered 2017-09-28 (×4): 10 meq via INTRAVENOUS
  Filled 2017-09-28 (×3): qty 50

## 2017-09-28 MED ORDER — MAGNESIUM SULFATE 2 GM/50ML IV SOLN
2.0000 g | Freq: Once | INTRAVENOUS | Status: AC
  Administered 2017-09-28: 2 g via INTRAVENOUS
  Filled 2017-09-28: qty 50

## 2017-09-28 MED ORDER — FUROSEMIDE 10 MG/ML IJ SOLN
40.0000 mg | Freq: Three times a day (TID) | INTRAMUSCULAR | Status: AC
  Administered 2017-09-28 (×2): 40 mg via INTRAVENOUS
  Filled 2017-09-28 (×2): qty 4

## 2017-09-28 NOTE — Progress Notes (Signed)
PULMONARY / CRITICAL CARE MEDICINE   Name: Johnny [name] MRN: [id number] DOB: [birth year]    ADMISSION DATE:  10/17/2017 CONSULTATION DATE:  6/5  REFERRING MD:  Dr. Suzi Roots EDP  CHIEF COMPLAINT:  Shock  HISTORY OF PRESENT ILLNESS:   83 year old male with PMH as below, which is significant for ETOH abuse, CAD s/p CABG, systolic CHF (ICM. LVEF 40%), alcoholic hepatitis, PAF s/p ablation, DM, and cardiac arrest in the setting of withdrawal seizures. He had been in the midst of a several day alcohol binge as reported by his family, but seemed to be doing OK (at baseline in wheelchair). He did complain of productive cough during that time. 6/5 they attempted to give him a bath and he developed significant respiratory distress and alteration in mental status. Upon arrival to the ED he had O2 sats in the 50% and ultimately required intubation. He then became hypotensive prompting IVF resuscitation and eventually CVL placement for pressors. PCCM asked to evaluate for admission.   SUBJECTIVE:   No events overnight, no new complaints, on precedex  VITAL SIGNS: BP (!) 152/61   Pulse 78   Temp 97.9 F (36.6 C)   Resp 20   Ht 5' 10.5" (1.791 m)   Wt 181 lb 7 oz (82.3 kg)   SpO2 100%   BMI 25.67 kg/m   HEMODYNAMICS: CVP:  [11 mmHg-12 mmHg] 11 mmHg  VENTILATOR SETTINGS: Vent Mode: PRVC FiO2 (%):  [35 %] 35 % Set Rate:  [14 bmp] 14 bmp Vt Set:  [590 mL] 590 mL PEEP:  [5 cmH20] 5 cmH20 Pressure Support:  [8 cmH20] 8 cmH20 Plateau Pressure:  [17 cmH20-27 cmH20] 21 cmH20  INTAKE / OUTPUT: I/O last 3 completed shifts: In: 4074.6 [I.V.:1700.4; NG/GT:2374.2] Out: 4260 [Urine:2910; Emesis/NG output:1350]  PHYSICAL EXAMINATION:  General: Frail elderly male, arousable and nodes to questions appropriately HEENT: [place]/AT, PERRL, EOM-I and MMM Neuro: Arousable, moves all ext to command CV: RRR, Nl S1/S2 and -M/R/G PULM: Coarse BS diffusely GI: Soft, NT, ND and +BS Extremities: warm/dry, plus edema   Skin: no rashes or lesions  LABS:  BMET Recent Labs  Lab 09/25/17 0538 09/26/17 0521 09/28/17 0433  NA 142 139 133*  K 4.2 3.6 3.0*  CL 115* 111 105  CO2 18* 17* 20*  BUN 35* 39* 45*  CREATININE 1.95* 1.73* 1.22  GLUCOSE 85 242* 259*   Electrolytes Recent Labs  Lab 09/25/17 0538  09/26/17 0521 09/26/17 1700 09/27/17 0331 09/28/17 0433  CALCIUM 8.3*  --  8.5*  --   --  7.8*  MG 1.9   < > 1.7 1.5* 1.4* 1.3*  PHOS  --    < > 3.3 2.7 2.7 2.8   < > = values in this interval not displayed.   CBC Recent Labs  Lab 09/25/17 0538 09/26/17 0521 09/28/17 0433  WBC 10.9* 8.8 11.2*  HGB 10.6* 11.3* 10.6*  HCT 33.1* 36.3* 33.1*  PLT 87* 99* 91*   Coag's No results for input(s): APTT, INR in the last 168 hours.  Sepsis Markers Recent Labs  Lab 10/13/2017 1635 09/26/2017 1956 09/24/17 1330 09/25/17 1808 09/26/17 0521 09/27/17 0331  LATICACIDVEN 4.3* 3.5* 1.6  --   --   --   PROCALCITON  --   --   --  0.57 0.45 0.35   ABG Recent Labs  Lab 09/29/2017 1641 10/10/2017 1857  PHART 7.283* 7.399  PCO2ART 35.6 23.9*  PO2ART 23.0* 477.0*   Liver Enzymes Recent Labs  Lab 10/18/2017 1416 09/26/17 0521  AST 69* 90*  ALT 26 86*  ALKPHOS 185* 141*  BILITOT 2.7* 1.3*  ALBUMIN 3.5 2.6*   Cardiac Enzymes Recent Labs  Lab 09/24/17 0431 09/24/17 1330 09/25/17 0538  TROPONINI 0.27* 0.28* 0.17*   Glucose Recent Labs  Lab 09/27/17 1146 09/27/17 1528 09/27/17 1943 09/27/17 2319 09/28/17 0335 09/28/17 0802  GLUCAP 137* 166* 147* 142* 219* 235*   Imaging Dg Chest Port 1 View  Result Date: 09/28/2017 CLINICAL DATA:  Respiratory failure EXAM: PORTABLE CHEST 1 VIEW COMPARISON:  Chest x-rays dated 09/27/2017, 09/26/2017 and 11/16/2016. FINDINGS: Endotracheal tube appears well positioned with tip approximately 3 cm above the carina. Enteric tube passes below the diaphragm. RIGHT IJ central line appears adequately positioned with tip at the level of the mid/lower SVC.  Stable cardiomegaly. Stable bibasilar pleural effusions with probable associated atelectasis. No new lung findings. No pneumothorax seen. IMPRESSION: 1. No change compared to yesterday's chest x-ray. Persistent bibasilar pleural effusions, small to moderate in size, with probable associated atelectasis. 2. Support apparatus appears appropriately positioned. Electronically Signed   By: Bary Richard M.D.   On: 09/28/2017 08:40   STUDIES:    CULTURES: Blood cultures 6/5 > Tracheal aspiration  6/5 >  ANTIBIOTICS: Cefepime 6/5 > Vanco 6/5 >  SIGNIFICANT EVENTS: 6/5 admit  LINES/TUBES: ETT 6/5 > RIJ CVL 6/5 >  DISCUSSION: 83 year old male with ETOH abuse admitted after several day ETOH binge presenting hypoxic. Felt to have aspirated. Intubated and started on pressors in ED. Pressor weaned off quickly with volume administration.  Wean as tolerated he may need repeat tracheostomy due to his advanced age and health comorbidities if he remains a full code.  ASSESSMENT / PLAN:  Acute hypoxemic respiratory failure in setting of CAP vs aspiration  pcxr personally reviewed: ETT and CVL in satisfactory position. R>L airspace disease persists. Bibasilar aeration looks worse when c/w prior film.  May be an element of volume excess Plan Place on 5/5 today, no extubation as failed prior weaning trial this AM Daily wake up assessment Had a trach in the past, will likely require another one but will need to speak with family about plan of care  Shock (sepsis and hypovolemia), c/b h/o: Ischemic cardiomyopathy resolved, CAD s/p CABG Hx HTN, HLD, Aflutter s/p ablation Troponin elevation d/t demand ischemia  -pressor requirements: Resolved -LA cleared  -His left ventricular ejection fraction has declined further, now EF 20 to 25% with diffuse hypokinesis.  Plan D/C pressors Tele monitoring Active dirueses  AKI; suspect hemodynamically mediated Lab Results  Component Value Date   CREATININE  1.22 09/28/2017   CREATININE 1.73 (H) 09/26/2017   CREATININE 1.95 (H) 09/25/2017   CREATININE 1.16 12/08/2014   Recent Labs  Lab 09/25/17 0538 09/26/17 0521 09/28/17 0433  K 4.2 3.6 3.0*   Recent Labs  Lab 09/25/17 0538 09/26/17 0521 09/28/17 0433  NA 142 139 133*   Plan KVO IVF Replace K and Mg Lasix 40 mg IV q8 x2 doses  Fluid and Electrolyte imbalance: NAG metabolic acidosis, in setting of hyperchloremia Resolved Plan KVO IVF Free water  Acute metabolic encephalopathy: - CT head wnl Plan 09/26/2017 awake follows commands sedation is been changed to Precedex  Anemia and thrombocytopenia. Both chronic.  Recent Labs    09/26/17 0521 09/28/17 0433  HGB 11.3* 10.6*   -plts holding  -hgb stable; no evidence of bleeding Plan Trend CBC  Transfuse per ICU protocol  DM CBG (last 3)  Recent Labs    09/27/17 2319 09/28/17 0335 09/28/17 0802  GLUCAP 142* 219* 235*   Plan Continue sliding-scale insulin  FAMILY  - Updates: Need to have a family meeting regarding trach vs one way extubation  - Inter-disciplinary family meet or Palliative Care meeting due by:  6/12  The patient is critically ill with multiple organ systems failure and requires high complexity decision making for assessment and support, frequent evaluation and titration of therapies, application of advanced monitoring technologies and extensive interpretation of multiple databases.   Critical Care Time devoted to patient care services described in this note is  32  Minutes. This time reflects time of care of this signee Dr Koren BoundWesam Jeydan Barner. This critical care time does not reflect procedure time, or teaching time or supervisory time of PA/NP/Med student/Med Resident etc but could involve care discussion time.  Alyson ReedyWesam G. Ellarie Picking, M.D. [facility]Critical [facility]. Pager: [phone]. After hours pager: [phone].  09/28/2017, 10:10 AM

## 2017-09-28 NOTE — Progress Notes (Signed)
[facility] FOR AM LAB REPLACEMENT ONLY  The patient does apply for the [facility] Adult ICU Electrolyte Replacment Protocol based on the criteria listed below:   1. Is GFR >/= 40 ml/min? Yes.    Patient's GFR today is >60 2. Is urine output >/= 0.5 ml/kg/hr for the last 6 hours? Yes.   Patient's UOP is 0.6 ml/kg/hr 3. Is BUN < 60 mg/dL? Yes.    Patient's BUN today is 45 4. Abnormal electrolyte(s):k 3.0 5. Ordered repletion with: protocol 6. If a panic level lab has been reported, has the CCM MD in charge been notified? No..   Physician:    Markus DaftWHELAN, Pierre Cumpton A 09/28/2017 5:57 AM

## 2017-09-28 NOTE — Progress Notes (Signed)
Patient did not tolerate vent weaning this AM. Within 4 minutes of initiating weaning the patient became tachycardic into the 140s. Patient was returned to full vent support and his symptoms resolved.

## 2017-09-28 NOTE — Progress Notes (Signed)
Inpatient Diabetes Program Recommendations  AACE/ADA: New Consensus Statement on Inpatient Glycemic Control (2019)  Target Ranges:  Prepandial:   less than 140 mg/dL      Peak postprandial:   less than 180 mg/dL (1-2 hours)      Critically ill patients:  140 - 180 mg/dL   Results for Johnny NewcomerWADE, Ashdon E (MRN [id number]) as of 09/28/2017 12:29  Ref. Range 09/27/2017 08:02 09/27/2017 11:46 09/27/2017 15:28 09/27/2017 19:43 09/27/2017 23:19 09/28/2017 03:35 09/28/2017 08:02 09/28/2017 11:32  Glucose-Capillary Latest Ref Range: 65 - 99 mg/dL 409138 (H) 811137 (H) 914166 (H) 147 (H) 142 (H) 219 (H) 235 (H) 212 (H)   Review of Glycemic Control  Diabetes history: DM2 Outpatient Diabetes medications: Metformin 500 mg QAM Current orders for Inpatient glycemic control: Lantus 5 units QHS, Novolog 0-15 units Q4H; Vital @ 60 ml/hr  Inpatient Diabetes Program Recommendations:  Insulin - Tube Feeding Coverage: Please consider ordering Novolog 4 units Q4H for tube feeding coverage. If tube feeding is stopped or held then Novolog tube feeding coverage would also be stopped or held.  Thanks, Orlando PennerMarie Ellard Nan, RN, MSN, CDE Diabetes Coordinator Inpatient Diabetes Program [phone] (Team Pager from 8am to 5pm)

## 2017-09-29 ENCOUNTER — Inpatient Hospital Stay (HOSPITAL_COMMUNITY): Payer: Medicare HMO

## 2017-09-29 DIAGNOSIS — G9340 Encephalopathy, unspecified: Secondary | ICD-10-CM

## 2017-09-29 LAB — GLUCOSE, CAPILLARY
GLUCOSE-CAPILLARY: 196 mg/dL — AB (ref 65–99)
GLUCOSE-CAPILLARY: 165 mg/dL — AB (ref 65–99)
GLUCOSE-CAPILLARY: 171 mg/dL — AB (ref 65–99)
Glucose-Capillary: 191 mg/dL — ABNORMAL HIGH (ref 65–99)
Glucose-Capillary: 170 mg/dL — ABNORMAL HIGH (ref 65–99)
Glucose-Capillary: 167 mg/dL — ABNORMAL HIGH (ref 65–99)

## 2017-09-29 LAB — BASIC METABOLIC PANEL
ANION GAP: 10 (ref 5–15)
Anion gap: 7 (ref 5–15)
BUN: 51 mg/dL — AB (ref 6–20)
BUN: 57 mg/dL — ABNORMAL HIGH (ref 6–20)
CALCIUM: 8 mg/dL — AB (ref 8.9–10.3)
CHLORIDE: 108 mmol/L (ref 101–111)
CHLORIDE: 103 mmol/L (ref 101–111)
CO2: 20 mmol/L — ABNORMAL LOW (ref 22–32)
CO2: 19 mmol/L — AB (ref 22–32)
CREATININE: 1.27 mg/dL — AB (ref 0.61–1.24)
CREATININE: 1.29 mg/dL — AB (ref 0.61–1.24)
Calcium: 8 mg/dL — ABNORMAL LOW (ref 8.9–10.3)
GFR calc Af Amer: 60 mL/min — ABNORMAL LOW (ref >60)
GFR calc non Af Amer: 52 mL/min — ABNORMAL LOW (ref >60)
GFR calc non Af Amer: 51 mL/min — ABNORMAL LOW (ref >60)
GFR, EST AFRICAN AMERICAN: 59 mL/min — AB (ref >60)
Glucose, Bld: 184 mg/dL — ABNORMAL HIGH (ref 65–99)
Glucose, Bld: 182 mg/dL — ABNORMAL HIGH (ref 65–99)
Potassium: 4.4 mmol/L (ref 3.5–5.1)
Potassium: 4.1 mmol/L (ref 3.5–5.1)
SODIUM: 135 mmol/L (ref 135–145)
Sodium: 132 mmol/L — ABNORMAL LOW (ref 135–145)

## 2017-09-29 LAB — PHOSPHORUS
PHOSPHORUS: 4.2 mg/dL (ref 2.5–4.6)
Phosphorus: 2 mg/dL — ABNORMAL LOW (ref 2.5–4.6)

## 2017-09-29 LAB — CBC
HCT: 31.3 % — ABNORMAL LOW (ref 39.0–52.0)
Hemoglobin: 10.2 g/dL — ABNORMAL LOW (ref 13.0–17.0)
MCH: 30.8 pg (ref 26.0–34.0)
MCHC: 32.6 g/dL (ref 30.0–36.0)
MCV: 94.6 fL (ref 78.0–100.0)
PLATELETS: 102 10*3/uL — AB (ref 150–400)
RBC: 3.31 MIL/uL — ABNORMAL LOW (ref 4.22–5.81)
RDW: 16.2 % — AB (ref 11.5–15.5)
WBC: 11.6 10*3/uL — AB (ref 4.0–10.5)

## 2017-09-29 LAB — MAGNESIUM
MAGNESIUM: 1.8 mg/dL (ref 1.7–2.4)
Magnesium: 1.5 mg/dL — ABNORMAL LOW (ref 1.7–2.4)

## 2017-09-29 MED ORDER — VECURONIUM BROMIDE 10 MG IV SOLR
10.0000 mg | Freq: Once | INTRAVENOUS | Status: AC
  Administered 2017-09-30: 10 mg via INTRAVENOUS
  Filled 2017-09-29: qty 10

## 2017-09-29 MED ORDER — ETOMIDATE 2 MG/ML IV SOLN
40.0000 mg | Freq: Once | INTRAVENOUS | Status: AC
  Administered 2017-09-30: 20 mg via INTRAVENOUS
  Filled 2017-09-29: qty 20

## 2017-09-29 MED ORDER — FUROSEMIDE 10 MG/ML IJ SOLN
40.0000 mg | Freq: Four times a day (QID) | INTRAMUSCULAR | Status: AC
  Administered 2017-09-29 (×3): 40 mg via INTRAVENOUS
  Filled 2017-09-29 (×3): qty 4

## 2017-09-29 MED ORDER — MAGNESIUM SULFATE 2 GM/50ML IV SOLN
2.0000 g | Freq: Once | INTRAVENOUS | Status: AC
  Administered 2017-09-29: 2 g via INTRAVENOUS
  Filled 2017-09-29: qty 50

## 2017-09-29 MED ORDER — SODIUM PHOSPHATES 45 MMOLE/15ML IV SOLN
30.0000 mmol | Freq: Once | INTRAVENOUS | Status: AC
  Administered 2017-09-29: 30 mmol via INTRAVENOUS
  Filled 2017-09-29: qty 10

## 2017-09-29 MED ORDER — MIDAZOLAM HCL 2 MG/2ML IJ SOLN
4.0000 mg | Freq: Once | INTRAMUSCULAR | Status: AC
  Administered 2017-09-30: 2 mg via INTRAVENOUS
  Filled 2017-09-29: qty 4

## 2017-09-29 MED ORDER — PROPOFOL 500 MG/50ML IV EMUL
5.0000 ug/kg/min | Freq: Once | INTRAVENOUS | Status: DC
  Filled 2017-09-29: qty 50

## 2017-09-29 MED ORDER — FENTANYL CITRATE (PF) 100 MCG/2ML IJ SOLN
200.0000 ug | Freq: Once | INTRAMUSCULAR | Status: AC
  Administered 2017-09-30: 100 ug via INTRAVENOUS
  Filled 2017-09-29: qty 4

## 2017-09-29 NOTE — Progress Notes (Signed)
PULMONARY / CRITICAL CARE MEDICINE   Name: Johnny [name] MRN: [id number] DOB: 01-15-1935    ADMISSION DATE:  10/14/2017 CONSULTATION DATE:  6/5  REFERRING MD:  Dr. Mallie Snooks EDP  CHIEF COMPLAINT:  Shock  HISTORY OF PRESENT ILLNESS:   83 year old male with PMH as below, which is significant for ETOH abuse, CAD s/p CABG, systolic CHF (ICM. LVEF 31%), alcoholic hepatitis, PAF s/p ablation, DM, and cardiac arrest in the setting of withdrawal seizures. He had been in the midst of a several day alcohol binge as reported by his family, but seemed to be doing OK (at baseline in wheelchair). He did complain of productive cough during that time. 6/5 they attempted to give him a bath and he developed significant respiratory distress and alteration in mental status. Upon arrival to the ED he had O2 sats in the 50% and ultimately required intubation. He then became hypotensive prompting IVF resuscitation and eventually CVL placement for pressors. PCCM asked to evaluate for admission.   SUBJECTIVE:   No events overnight, precedex at 1.6 PRN fentanyl NAD  VITAL SIGNS: BP 136/70   Pulse 75   Temp 99 F (37.2 C)   Resp 17   Ht 5' 10.5" (1.791 m)   Wt 182 lb 5.1 oz (82.7 kg)   SpO2 100%   BMI 25.79 kg/m   HEMODYNAMICS: CVP:  [2 mmHg-14 mmHg] 10 mmHg  VENTILATOR SETTINGS: Vent Mode: PRVC FiO2 (%):  [30 %] 30 % Set Rate:  [14 bmp] 14 bmp Vt Set:  [590 mL] 590 mL PEEP:  [5 cmH20] 5 cmH20 Pressure Support:  [5 cmH20-12 cmH20] 12 cmH20 Plateau Pressure:  [13 cmH20-32 cmH20] 13 cmH20  INTAKE / OUTPUT: I/O last 3 completed shifts: In: 5557.6 [I.V.:1270.9; NG/GT:3436.7; IV Piggyback:850] Out: 5945 [Urine:2860; Emesis/NG output:400]  PHYSICAL EXAMINATION:  General: Elderly chronically ill appearing male, NAD, sedate HEENT: [place]/AT, PERRL, EOM-I and MMM Neuro: Sedate or agitated, moving all ext spontaneously but not to command CV: RRR, Nl S1/S2 and -M/R/G PULM: Coarse BS diffusely GI: Soft, NT, ND  and +BS Extremities: warm/dry, plus edema  Skin: no rashes or lesions  LABS:  BMET Recent Labs  Lab 09/26/17 0521 09/28/17 0433 09/29/17 0412  NA 139 133* 135  K 3.6 3.0* 4.4  CL 111 105 108  CO2 17* 20* 20*  BUN 39* 45* 51*  CREATININE 1.73* 1.22 1.27*  GLUCOSE 242* 259* 184*   Electrolytes Recent Labs  Lab 09/26/17 0521  09/27/17 0331 09/28/17 0433 09/29/17 0412  CALCIUM 8.5*  --   --  7.8* 8.0*  MG 1.7   < > 1.4* 1.3* 1.5*  PHOS 3.3   < > 2.7 2.8 2.0*   < > = values in this interval not displayed.   CBC Recent Labs  Lab 09/26/17 0521 09/28/17 0433 09/29/17 0412  WBC 8.8 11.2* 11.6*  HGB 11.3* 10.6* 10.2*  HCT 36.3* 33.1* 31.3*  PLT 99* 91* 102*   Coag's No results for input(s): APTT, INR in the last 168 hours.  Sepsis Markers Recent Labs  Lab 10/08/2017 1635 10/15/2017 1956 09/24/17 1330 09/25/17 1808 09/26/17 0521 09/27/17 0331  LATICACIDVEN 4.3* 3.5* 1.6  --   --   --   PROCALCITON  --   --   --  0.57 0.45 0.35   ABG Recent Labs  Lab 10/13/2017 1641 10/13/2017 1857  PHART 7.283* 7.399  PCO2ART 35.6 23.9*  PO2ART 23.0* 477.0*   Liver Enzymes Recent Labs  Lab 10/07/2017  1416 09/26/17 0521  AST 69* 90*  ALT 26 86*  ALKPHOS 185* 141*  BILITOT 2.7* 1.3*  ALBUMIN 3.5 2.6*   Cardiac Enzymes Recent Labs  Lab 09/24/17 0431 09/24/17 1330 09/25/17 0538  TROPONINI 0.27* 0.28* 0.17*   Glucose Recent Labs  Lab 09/28/17 1132 09/28/17 1548 09/28/17 1939 09/28/17 2318 09/29/17 0320 09/29/17 0734  GLUCAP 212* 186* 181* 218* 196* 191*   Imaging Dg Chest Port 1 View  Result Date: 09/29/2017 CLINICAL DATA:  Endotracheal tube EXAM: PORTABLE CHEST 1 VIEW COMPARISON:  09/28/2017 FINDINGS: Endotracheal tube in good position. Feeding tube in place with the tip not visualized but entering the stomach. NG tube also enters the stomach. Right jugular central venous catheter tip at the cavoatrial junction unchanged. Probable small right apical  pneumothorax. Recommend follow-up for confirmation. Bibasilar atelectasis and effusion unchanged.  Negative for edema. IMPRESSION: Endotracheal tube remains in good position. Bibasilar atelectasis and effusion unchanged Probable small right apical pneumothorax. Recommend follow-up chest x-ray to confirm. Electronically Signed   By: Franchot Gallo M.D.   On: 09/29/2017 08:47   STUDIES:    CULTURES: Blood cultures 6/5 > Tracheal aspiration  6/5 >  ANTIBIOTICS: Cefepime 6/5 > Vanco 6/5 >  SIGNIFICANT EVENTS: 6/5 admit  LINES/TUBES: ETT 6/5 > RIJ CVL 6/5 >  DISCUSSION: 83 year old male with ETOH abuse admitted after several day ETOH binge presenting hypoxic. Felt to have aspirated. Intubated and started on pressors in ED. Pressor weaned off quickly with volume administration.  Wean as tolerated he may need repeat tracheostomy due to his advanced age and health comorbidities if he remains a full code.  ASSESSMENT / PLAN:  Acute hypoxemic respiratory failure in setting of CAP vs aspiration  pcxr personally reviewed: ETT and CVL in satisfactory position. R>L airspace disease persists. Bibasilar aeration looks worse when c/w prior film.  May be an element of volume excess Plan Failed weaning, hold further weaning Trach in AM per family meeting Daily wake up assessment Titrate O2 for sat of 88-92%  Shock (sepsis and hypovolemia), c/b h/o: Ischemic cardiomyopathy resolved, CAD s/p CABG Hx HTN, HLD, Aflutter s/p ablation Troponin elevation d/t demand ischemia  -pressor requirements: Resolved -LA cleared  -His left ventricular ejection fraction has declined further, now EF 20 to 25% with diffuse hypokinesis.  Plan D/C pressors Tele monitoring Diureses as below  AKI; suspect hemodynamically mediated Lab Results  Component Value Date   CREATININE 1.27 (H) 09/29/2017   CREATININE 1.22 09/28/2017   CREATININE 1.73 (H) 09/26/2017   CREATININE 1.16 12/08/2014   Recent Labs  Lab  09/26/17 0521 09/28/17 0433 09/29/17 0412  K 3.6 3.0* 4.4   Recent Labs  Lab 09/26/17 0521 09/28/17 0433 09/29/17 0412  NA 139 133* 135   Plan KVO IVF Replace K, Phos and Mg Lasix 40 mg IV q6 x3 doses BMET in AM  Fluid and Electrolyte imbalance: NAG metabolic acidosis, in setting of hyperchloremia Resolved Plan KVO IVF Free water  Acute metabolic encephalopathy: - CT head wnl Plan 09/26/2017 awake follows commands sedation is been changed to Precedex  Anemia and thrombocytopenia. Both chronic.  Recent Labs    09/28/17 0433 09/29/17 0412  HGB 10.6* 10.2*   -plts holding  -hgb stable; no evidence of bleeding Plan Trend CBC  Transfuse per ICU protocol  DM CBG (last 3)  Recent Labs    09/28/17 2318 09/29/17 0320 09/29/17 0734  GLUCAP 218* 196* 191*   Plan Continue sliding-scale insulin  FAMILY  -  Updates: Met with daughters, after discussion, decision was made to proceed with tracheostomy, will perform in AM.  - Inter-disciplinary family meet or Palliative Care meeting due by:  6/12  The patient is critically ill with multiple organ systems failure and requires high complexity decision making for assessment and support, frequent evaluation and titration of therapies, application of advanced monitoring technologies and extensive interpretation of multiple databases.   Critical Care Time devoted to patient care services described in this note is  36  Minutes. This time reflects time of care of this signee Dr Jennet Maduro. This critical care time does not reflect procedure time, or teaching time or supervisory time of PA/NP/Med student/Med Resident etc but could involve care discussion time.  Rush Farmer, M.D. [facility] Pulmonary/Critical Care Medicine. Pager: [phone]. After hours pager: [phone].  09/29/2017, 9:16 AM

## 2017-09-29 NOTE — Progress Notes (Signed)
eLink Physician-Brief Progress Note Patient Name: Johnny [name] DOB: 07-Sep-1934 MRN: [id number]   Date of Service  09/29/2017  HPI/Events of Note  Elevated HR - Looks like sinus tach.   eICU Interventions  Will order: 1. BMP, Mg++ and PO4--- STAT. 2. 12 Lead EKG now.      Intervention Category Major Interventions: Arrhythmia - evaluation and management  Sommer,Steven Eugene 09/29/2017, 8:07 PM

## 2017-09-29 NOTE — Progress Notes (Signed)
Inpatient Diabetes Program Recommendations  AACE/ADA: New Consensus Statement on Inpatient Glycemic Control (2019)  Target Ranges:  Prepandial:   less than 140 mg/dL      Peak postprandial:   less than 180 mg/dL (1-2 hours)      Critically ill patients:  140 - 180 mg/dL  Results for Johnny [name], Johnny [name] (MRN [id number]) as of 09/29/2017 08:41  Ref. Range 09/28/2017 15:48 09/28/2017 19:39 09/28/2017 23:18 09/29/2017 03:20 09/29/2017 07:34  Glucose-Capillary Latest Ref Range: 65 - 99 mg/dL 409186 (H) 811181 (H) 914218 (H) 196 (H) 191 (H)   Results for Johnny [name], Johnny [name] (MRN [id number]) as of 09/28/2017 12:29  Ref. Range 09/27/2017 08:02 09/27/2017 11:46 09/27/2017 15:28 09/27/2017 19:43 09/27/2017 23:19 09/28/2017 03:35 09/28/2017 08:02 09/28/2017 11:32  Glucose-Capillary Latest Ref Range: 65 - 99 mg/dL 086138 (H) 578137 (H) 469166 (H) 147 (H) 142 (H) 219 (H) 235 (H) 212 (H)   Review of Glycemic Control  Diabetes history: DM2 Outpatient Diabetes medications: Metformin 500 mg QAM Current orders for Inpatient glycemic control: Lantus 5 units QHS, Novolog 0-15 units Q4H; Vital @ 60 ml/hr  Inpatient Diabetes Program Recommendations:  Insulin - Tube Feeding Coverage: Please consider ordering Novolog 4 units Q4H for tube feeding coverage. If tube feeding is stopped or held then Novolog tube feeding coverage would also be stopped or held.  Thanks, Orlando PennerMarie Josiyah Tozzi, RN, MSN, CDE Diabetes Coordinator Inpatient Diabetes Program [phone] (Team Pager from 8am to 5pm)

## 2017-09-29 NOTE — Progress Notes (Signed)
RT X 2 and RN unable to palpate radial pulse, or doppler for routine arterial blood gas this a.m. MD aware. Will pass to oncoming therapist.

## 2017-09-30 ENCOUNTER — Inpatient Hospital Stay (HOSPITAL_COMMUNITY): Payer: Medicare HMO

## 2017-09-30 LAB — CBC
HCT: 30.3 % — ABNORMAL LOW (ref 39.0–52.0)
HEMOGLOBIN: 10 g/dL — AB (ref 13.0–17.0)
MCH: 31.1 pg (ref 26.0–34.0)
MCHC: 33 g/dL (ref 30.0–36.0)
MCV: 94.1 fL (ref 78.0–100.0)
PLATELETS: 118 10*3/uL — AB (ref 150–400)
RBC: 3.22 MIL/uL — ABNORMAL LOW (ref 4.22–5.81)
RDW: 16.8 % — ABNORMAL HIGH (ref 11.5–15.5)
WBC: 14.3 10*3/uL — AB (ref 4.0–10.5)

## 2017-09-30 LAB — BASIC METABOLIC PANEL
ANION GAP: 12 (ref 5–15)
BUN: 60 mg/dL — AB (ref 6–20)
CO2: 19 mmol/L — ABNORMAL LOW (ref 22–32)
Calcium: 8.2 mg/dL — ABNORMAL LOW (ref 8.9–10.3)
Chloride: 104 mmol/L (ref 101–111)
Creatinine, Ser: 1.44 mg/dL — ABNORMAL HIGH (ref 0.61–1.24)
GFR, EST AFRICAN AMERICAN: 51 mL/min — AB (ref >60)
GFR, EST NON AFRICAN AMERICAN: 44 mL/min — AB (ref >60)
Glucose, Bld: 96 mg/dL (ref 65–99)
POTASSIUM: 4.5 mmol/L (ref 3.5–5.1)
SODIUM: 135 mmol/L (ref 135–145)

## 2017-09-30 LAB — GLUCOSE, CAPILLARY
GLUCOSE-CAPILLARY: 59 mg/dL — AB (ref 65–99)
GLUCOSE-CAPILLARY: 95 mg/dL (ref 65–99)
GLUCOSE-CAPILLARY: 163 mg/dL — AB (ref 65–99)
Glucose-Capillary: 106 mg/dL — ABNORMAL HIGH (ref 65–99)
Glucose-Capillary: 163 mg/dL — ABNORMAL HIGH (ref 65–99)
Glucose-Capillary: 109 mg/dL — ABNORMAL HIGH (ref 65–99)

## 2017-09-30 LAB — MAGNESIUM: MAGNESIUM: 1.9 mg/dL (ref 1.7–2.4)

## 2017-09-30 LAB — PHOSPHORUS: PHOSPHORUS: 3.7 mg/dL (ref 2.5–4.6)

## 2017-09-30 MED ORDER — VITAL AF 1.2 CAL PO LIQD
1000.0000 mL | ORAL | Status: DC
  Administered 2017-09-30 – 2017-10-03 (×4): 1000 mL
  Filled 2017-09-30 (×9): qty 1000

## 2017-09-30 MED ORDER — DEXTROSE 50 % IV SOLN
INTRAVENOUS | Status: AC
  Administered 2017-09-30: 50 mL
  Filled 2017-09-30: qty 50

## 2017-09-30 MED ORDER — FUROSEMIDE 10 MG/ML IJ SOLN
40.0000 mg | Freq: Three times a day (TID) | INTRAMUSCULAR | Status: AC
  Administered 2017-09-30 (×2): 40 mg via INTRAVENOUS
  Filled 2017-09-30 (×2): qty 4

## 2017-09-30 MED ORDER — FAMOTIDINE 40 MG/5ML PO SUSR: 20.0000 mg | Freq: Every day | ORAL | Status: DC

## 2017-09-30 MED ORDER — MAGNESIUM SULFATE 2 GM/50ML IV SOLN
2.0000 g | Freq: Once | INTRAVENOUS | Status: AC
  Administered 2017-09-30: 2 g via INTRAVENOUS
  Filled 2017-09-30: qty 50

## 2017-09-30 MED ORDER — FAMOTIDINE 40 MG/5ML PO SUSR
20.0000 mg | Freq: Every day | ORAL | Status: DC
  Administered 2017-09-30 – 2017-10-18 (×19): 20 mg
  Filled 2017-09-30 (×19): qty 2.5

## 2017-09-30 NOTE — Progress Notes (Signed)
Nutrition Follow-up  DOCUMENTATION CODES:   Not applicable  INTERVENTION:   Tube Feeding:  Vital AF 1.2 @ 70 ml/hr Provides 2016 kcals, 126 g of protein and 1361 mL of free water Meets 100% estimated protein needs, 98% calorie needs  NUTRITION DIAGNOSIS:   Inadequate oral intake related to inability to eat as evidenced by estimated needs.  Being addressed via TF   GOAL:   Patient will meet greater than or equal to 90% of their needs  Progressing  MONITOR:   Diet advancement, Vent status, Weight trends, Labs, I & O's, TF tolerance  REASON FOR ASSESSMENT:   Consult Enteral/tube feeding initiation and management  ASSESSMENT:   Patient with PMH significant for ETOH abuse, CAD s/p CABG, CHF, alcoholic hepatitis, PAF s/p ablation, DM, and cardiac arrest in setting of withdrawals. Presents this admission after a several day alcohol binge. Admitted for acute metabolic encephalopathy and respiratory failure requiring intubation.    Patient is currently intubated on ventilator support MV: 12.6 L/min Temp (24hrs), Avg:100 F (37.8 C), Min:97.9 F (36.6 C), Max:101.1 F (38.4 C)  Febrile overnight Trach placement today TF on hold for procedure; previously tolerating Vital Af 1.2 @ 60  Weight has fluctuated up and down since admission; current wt 83.6 kg. Weight on admission 79.5 kg. Net positive 6.7 L since admission per I/O flow sheet Utilizing weight of 79.5 kg as estimated dry weight at present  Labs: reviewed Meds:  Lasix, thiamine, folic acid  Diet Order:   Diet Order           Diet NPO time specified  Diet effective midnight          EDUCATION NEEDS:   Not appropriate for education at this time  Skin:  Skin Assessment: Skin Integrity Issues: Skin Integrity Issues:: Other (Comment) Other: MASD: buttock  Last BM:  6/10  Height:   Ht Readings from Last 1 Encounters:  10/31/17 5' 10.5" (1.791 m)    Weight:   Wt Readings from Last 1 Encounters:   09/30/17 184 lb 4.9 oz (83.6 kg)    Ideal Body Weight:  75.5 kg  BMI:  Body mass index is 26.07 kg/m.  Estimated Nutritional Needs:   Kcal:  2055 kcals  Protein:  120-145 g  Fluid:  >1.6 L/day   Romelle Starcherate Kailoni Vahle MS, RD, LDN, CNSC [phone] Pager  [phone] Weekend/On-Call Pager

## 2017-09-30 NOTE — Progress Notes (Signed)
PULMONARY / CRITICAL CARE MEDICINE   Name: Johnny [name] MRN: [id number] DOB: [birth year]    ADMISSION DATE:  09/21/2017 CONSULTATION DATE:  6/5  REFERRING MD:  Dr. Suzi RootsMilled EDP  CHIEF COMPLAINT:  Shock  HISTORY OF PRESENT ILLNESS:   83 year old male with PMH as below, which is significant for ETOH abuse, CAD s/p CABG, systolic CHF (ICM. LVEF 40%), alcoholic hepatitis, PAF s/p ablation, DM, and cardiac arrest in the setting of withdrawal seizures. He had been in the midst of a several day alcohol binge as reported by his family, but seemed to be doing OK (at baseline in wheelchair). He did complain of productive cough during that time. 6/5 they attempted to give him a bath and he developed significant respiratory distress and alteration in mental status. Upon arrival to the ED he had O2 sats in the 50% and ultimately required intubation. He then became hypotensive prompting IVF resuscitation and eventually CVL placement for pressors. PCCM asked to evaluate for admission.   SUBJECTIVE:   No events overnight, no new complaints  VITAL SIGNS: BP (!) 98/57   Pulse 68   Temp 99.5 F (37.5 C)   Resp 14   Ht 5' 10.5" (1.791 m)   Wt 184 lb 4.9 oz (83.6 kg)   SpO2 (!) 85%   BMI 26.07 kg/m   HEMODYNAMICS: CVP:  [10 mmHg-21 mmHg] 11 mmHg  VENTILATOR SETTINGS: Vent Mode: PRVC FiO2 (%):  [30 %-50 %] 50 % Set Rate:  [14 bmp] 14 bmp Vt Set:  [590 mL] 590 mL PEEP:  [5 cmH20] 5 cmH20 Plateau Pressure:  [20 cmH20-22 cmH20] 20 cmH20  INTAKE / OUTPUT: I/O last 3 completed shifts: In: 4526.6 [I.V.:1144.8; NG/GT:2800; IV Piggyback:581.8] Out: 3025 [Urine:3025]  PHYSICAL EXAMINATION:  General: Elderly appearing male, NAD HEENT: [place]/AT, PERRL, EOM-I and MMM Neuro: Sedated but following some commands CV: RRR, Nl S1/S2 and -M/R/G PULM: Coarse BS diffusely GI: Soft, NT, ND and +BS Extremities: warm/dry, plus edema  Skin: Intact  LABS:  BMET Recent Labs  Lab 09/29/17 0412 09/29/17 2016  09/30/17 0433  NA 135 132* 135  K 4.4 4.1 4.5  CL 108 103 104  CO2 20* 19* 19*  BUN 51* 57* 60*  CREATININE 1.27* 1.29* 1.44*  GLUCOSE 184* 182* 96   Electrolytes Recent Labs  Lab 09/29/17 0412 09/29/17 2016 09/30/17 0433  CALCIUM 8.0* 8.0* 8.2*  MG 1.5* 1.8 1.9  PHOS 2.0* 4.2 3.7   CBC Recent Labs  Lab 09/28/17 0433 09/29/17 0412 09/30/17 0433  WBC 11.2* 11.6* 14.3*  HGB 10.6* 10.2* 10.0*  HCT 33.1* 31.3* 30.3*  PLT 91* 102* 118*   Coag's No results for input(s): APTT, INR in the last 168 hours.  Sepsis Markers Recent Labs  Lab 09/21/2017 1635 09/24/2017 1956 09/24/17 1330 09/25/17 1808 09/26/17 0521 09/27/17 0331  LATICACIDVEN 4.3* 3.5* 1.6  --   --   --   PROCALCITON  --   --   --  0.57 0.45 0.35   ABG Recent Labs  Lab 09/28/2017 1641 10/04/2017 1857  PHART 7.283* 7.399  PCO2ART 35.6 23.9*  PO2ART 23.0* 477.0*   Liver Enzymes Recent Labs  Lab 10/06/2017 1416 09/26/17 0521  AST 69* 90*  ALT 26 86*  ALKPHOS 185* 141*  BILITOT 2.7* 1.3*  ALBUMIN 3.5 2.6*   Cardiac Enzymes Recent Labs  Lab 09/24/17 0431 09/24/17 1330 09/25/17 0538  TROPONINI 0.27* 0.28* 0.17*   Glucose Recent Labs  Lab 09/29/17 1552 09/29/17  1919 09/29/17 2320 09/30/17 0327 09/30/17 0719 09/30/17 0757  GLUCAP 171* 170* 167* 106* 59* 163*   Imaging Dg Chest Port 1 View  Result Date: 09/30/2017 CLINICAL DATA:  Hypoxia EXAM: PORTABLE CHEST 1 VIEW COMPARISON:  September 29, 2017 FINDINGS: Endotracheal tube tip is 3.5 cm above the carina. Nasogastric tube and feeding tube tips are below the diaphragm. Central catheter tip is in the superior vena cava near the cavoatrial junction. No pneumothorax. There are bilateral pleural effusions with patchy airspace consolidation in both lower lobes. Heart is mildly enlarged with pulmonary vascularity normal. Patient is status post coronary artery bypass grafting. There is aortic atherosclerosis. No evident bone lesions. IMPRESSION: Tube and  catheter positions as described. Small pneumothorax seen previously on the right is no longer evident. There are bilateral pleural effusions with airspace consolidation in both lower lobes. Suspect a degree of pneumonia in the lung bases, although alveolar edema could present in this manner. Both entities may be present concurrently. Stable cardiac prominence. There is aortic atherosclerosis. Aortic Atherosclerosis (ICD10-I70.0). Electronically Signed   By: Bretta Bang III M.D.   On: 09/30/2017 07:06   STUDIES:    CULTURES: Blood cultures 6/5 > Tracheal aspiration  6/5 >  ANTIBIOTICS: Cefepime 6/5 > Vanco 6/5 >  SIGNIFICANT EVENTS: 6/5 admit  LINES/TUBES: ETT 6/5>>>6/12 Trach 6/12>>> RIJ CVL 6/5>>>  DISCUSSION: 83 year old male with ETOH abuse admitted after several day ETOH binge presenting hypoxic. Felt to have aspirated. Intubated and started on pressors in ED. Pressor weaned off quickly with volume administration.  Wean as tolerated he may need repeat tracheostomy due to his advanced age and health comorbidities if he remains a full code.  ASSESSMENT / PLAN:  Acute hypoxemic respiratory failure in setting of CAP vs aspiration  pcxr personally reviewed: ETT and CVL in satisfactory position. R>L airspace disease persists. Bibasilar aeration looks worse when c/w prior film.  May be an element of volume excess Plan Plan on trach today Daily wake up assessment Titrate O2 for sat of 88-92%  Shock (sepsis and hypovolemia), c/b h/o: Ischemic cardiomyopathy resolved, CAD s/p CABG Hx HTN, HLD, Aflutter s/p ablation Troponin elevation d/t demand ischemia  -pressor requirements: Resolved -LA cleared  -His left ventricular ejection fraction has declined further, now EF 20 to 25% with diffuse hypokinesis.  Plan D/C pressors Continue tele monitoring Continue active diureses  AKI; suspect hemodynamically mediated Lab Results  Component Value Date   CREATININE 1.44 (H)  09/30/2017   CREATININE 1.29 (H) 09/29/2017   CREATININE 1.27 (H) 09/29/2017   CREATININE 1.16 12/08/2014   Recent Labs  Lab 09/29/17 0412 09/29/17 2016 09/30/17 0433  K 4.4 4.1 4.5   Recent Labs  Lab 09/29/17 0412 09/29/17 2016 09/30/17 0433  NA 135 132* 135   Plan KVO IVF Replace electrolytes as indicated Lasix 40 mg IV q8 x2 doses BMET in AM  Fluid and Electrolyte imbalance: NAG metabolic acidosis, in setting of hyperchloremia Resolved Plan KVO IVF Free water as ordered Will need NGT placement post trach Will consult CCS for PEG  Acute metabolic encephalopathy: - CT head wnl Plan 09/26/2017 awake follows commands sedation is been changed to Precedex  Anemia and thrombocytopenia. Both chronic.  Recent Labs    09/29/17 0412 09/30/17 0433  HGB 10.2* 10.0*   -plts holding  -hgb stable; no evidence of bleeding Plan Trend CBC  Transfuse per ICU protocol  DM CBG (last 3)  Recent Labs    09/30/17 0327 09/30/17 0719 09/30/17  0757  GLUCAP 106* 59* 163*   Plan Continue sliding-scale insulin  FAMILY  - Updates: No family bedside.  Will plan trach today.  - Inter-disciplinary family meet or Palliative Care meeting due by:  6/12  The patient is critically ill with multiple organ systems failure and requires high complexity decision making for assessment and support, frequent evaluation and titration of therapies, application of advanced monitoring technologies and extensive interpretation of multiple databases.   Critical Care Time devoted to patient care services described in this note is  32  Minutes. This time reflects time of care of this signee Dr Koren Bound. This critical care time does not reflect procedure time, or teaching time or supervisory time of PA/NP/Med student/Med Resident etc but could involve care discussion time.  Alyson Reedy, M.D. [facility] Pulmonary/Critical Care Medicine. Pager: [phone]. After hours pager:  [phone].  09/30/2017, 9:36 AM

## 2017-09-30 NOTE — Procedures (Signed)
Percutaneous Tracheostomy Placement  Consent from family.  Patient sedated, paralyzed and position.  Placed on 100% FiO2 and RR matched.  Area cleaned and draped.  Lidocaine/epi injected.  Skin incision done followed by blunt dissection.  Trachea palpated then punctured, catheter passed and visualized bronchoscopically.  Wire placed and visualized.  Catheter removed.  Airway then entered and dilated.  Size 6 cuffed shiley trach placed and visualized bronchoscopically well above carina.  Good volume returns.  Patient tolerated the procedure well without complications.  Minimal blood loss.  CXR ordered and pending.  Neville Pauls G. Lynnett Langlinais, M.D. [place] Pulmonary/Critical Care Medicine. Pager: [phone]. After hours pager: [phone].  

## 2017-09-30 NOTE — Progress Notes (Signed)
Hypoglycemic Event  CBG: 59 at 0719   Treatment: amp d50  Symptoms: none at this time  Follow-up CBG: Time:7:58 CBG Result: 163  Possible Reasons for Event: Pt NPO  Comments/MD notified: Protocol followed, paged MD, awaiting MD orders, will continue to monitor     Safeco CorporationSheleigh R Larrissa [name]

## 2017-09-30 NOTE — Procedures (Addendum)
Bedside Tracheostomy Insertion Procedure Note   Patient Details:   Name: Johnny [name] DOB: [birth year] MRN: [id number]  Procedure: Tracheostomy  Pre Procedure Assessment: ET Tube Size: 7.5 ET Tube secured at lip (cm): 22 Bite block in place: No Breath Sounds: Clear  Post Procedure Assessment: BP 109/77 (BP Location: Right Arm)   Pulse 64   Temp 98.8 F (37.1 C)   Resp 18   Ht 5' 10.5" (1.791 m)   Wt 184 lb 4.9 oz (83.6 kg)   SpO2 (!) 88%   BMI 26.07 kg/m  O2 sats: stable throughout Complications: No apparent complications Patient did tolerate procedure well Tracheostomy Brand:Shiley Tracheostomy Style:Cuffed Tracheostomy Size: 6 Tracheostomy Secured KGM:[id number]:Sutures, velcro Tracheostomy Placement Confirmation:Trach cuff visualized and in place and Chest X ray ordered for placement    Johnny [name], Johnny [name] 09/30/2017, 1:57 PM

## 2017-09-30 NOTE — Progress Notes (Signed)
RT note-Patient throughout the day with low sp02, prior and post trach. Currently remains on a fi02 60%, and PEEP +12, continue to monitor and wean as tolerated.

## 2017-09-30 NOTE — Consult Note (Signed)
[facility] Surgery Consult Note  Johnny [name] 1935-04-18  [id number].    Requesting MD: Nelda Marseille Chief Complaint/Reason for Consult: PEG Placement HPI:  Patient is a 83 year old male with PMH significant for systolic CHF, PAF s/p ablation, CAD s/p CABG, T2DM, EtOH abuse with hx of alcoholic hepatitis and Hx of cardiac arrest in the setting of withdrawal seizures. Patient has had past PEG placed in 2013 by Dr. Cristina Gong for dysphagia related to CVA.  When patient presented to ED he had been on a several day alcohol binge but seemed to be at baseline per family. Did have a productive cough and on 6/5 developed respiratory distress and altered mental status while family was trying to bathe him. In the ED patient had O2 sats of 50% and was intubated. Required pressors. Patient underwent tracheostomy today with PCCM.   ROS: Review of Systems  Unable to perform ROS: Acuity of condition (patient sedated and on the vent)    Family History  Problem Relation Age of Onset  . Heart failure Mother   . Aneurysm Sister   . Suicidality Brother   . Heart failure Brother   . Diabetes Sister     Past Medical History:  Diagnosis Date  . Acute alcoholic hepatitis 0/93/2671  . Altered mental status 04/01/2012  . Anemia of other chronic disease 08/16/2007  . Arthritis   . CAD (coronary artery disease)    a. s/p CABG, notes unclear - 1998 or 2003?  Marland Kitchen Chronic low back pain   . Coagulopathy ([place])   . Diabetes mellitus ([place])   . Essential hypertension   . ETOH abuse   . Hallucination 04/01/2012  . History of pneumonia   . History of stroke   . Hyperlipidemia   . Paroxysmal atrial fibrillation (HCC)   . Paroxysmal atrial flutter ([place])    a. s/p ablation 2007.  Marland Kitchen PEA (Pulseless electrical activity) ([place])    a. h/o PEA cardiac arrest 04/2012 (felt r/t PNA, acidosis, hypoxic resp failure in setting of alcohol withdrawal; required tracheostomy)  . Protein calorie malnutrition ([place])   . Seizures ([place])  04/01/2012    Past Surgical History:  Procedure Laterality Date  . CATARACT EXTRACTION W/ INTRAOCULAR LENS  IMPLANT, BILATERAL     "years ago" (04/01/2012)  . CORONARY ARTERY BYPASS GRAFT  ~ 2003   CABG X4  . ESOPHAGOGASTRODUODENOSCOPY  04/13/2012   Procedure: ESOPHAGOGASTRODUODENOSCOPY (EGD);  Surgeon: Cleotis Nipper, MD;  Location: [facility] ENDOSCOPY;  Service: Endoscopy;  Laterality: N/A;  push peg  . LEFT HEART CATH AND CORS/GRAFTS ANGIOGRAPHY N/A 11/17/2016   Procedure: Left Heart Cath and Cors/Grafts Angiography;  Surgeon: Jettie Booze, MD;  Location: [facility] LAB;  Service: Cardiovascular;  Laterality: N/A;  . PEG PLACEMENT  04/13/2012   Procedure: PERCUTANEOUS ENDOSCOPIC GASTROSTOMY (PEG) PLACEMENT;  Surgeon: Cleotis Nipper, MD;  Location: [facility] ENDOSCOPY;  Service: Endoscopy;  Laterality: N/A;    Social History:  reports that he quit smoking about 34 years ago. His smoking use included cigarettes. He has a 8.50 pack-year smoking history. He has never used smokeless tobacco. He reports that he drinks about 10.8 oz of alcohol per week. He reports that he does not use drugs.  Allergies: No Known Allergies  Medications Prior to Admission  Medication Sig Dispense Refill  . acetaminophen (TYLENOL) 325 MG tablet Take 2 tablets (650 mg total) by mouth every 6 (six) hours as needed for mild pain (or Fever >/= 101). 30 tablet 0  .  aspirin 81 MG tablet Take 81 mg by mouth daily.    Marland Kitchen atorvastatin (LIPITOR) 20 MG tablet Take 1 tablet (20 mg total) by mouth daily. 90 tablet 1  . clopidogrel (PLAVIX) 75 MG tablet TAKE 1 TABLET BY MOUTH EVERY DAY 30 tablet 0  . folic acid (FOLVITE) 1 MG tablet Take 1 tablet (1 mg total) by mouth daily. 90 tablet 2  . metoprolol tartrate (LOPRESSOR) 50 MG tablet Take 1.5 tablets (75 mg total) by mouth 2 (two) times daily. 270 tablet 0  . Multiple Vitamin (MULTIVITAMIN) tablet Take 1 tablet by mouth daily. Reported on 11/08/2015    . nitroGLYCERIN  (NITROSTAT) 0.4 MG SL tablet Place 1 tablet (0.4 mg total) under the tongue every 5 (five) minutes as needed for chest pain. Up to 3 doses. 30 tablet 0  . tamsulosin (FLOMAX) 0.4 MG CAPS capsule TAKE 1 CAPSULE (0.4 MG TOTAL) BY MOUTH DAILY. 90 capsule 3  . thiamine (CVS B-1) 100 MG tablet Take 1 tablet (100 mg total) by mouth daily. 90 tablet 3  . ACCU-CHEK AVIVA PLUS test strip USE TO TEST BLOOD SUGAR ONCE A DAY 100 each 4  . ACCU-CHEK SOFTCLIX LANCETS lancets USE 1 EACH BY OTHER ROUTE DAILY AS NEEDED FOR OTHER. 100 each 0  . Blood Glucose Calibration (ACCU-CHEK AVIVA) SOLN     . Lancet Devices (ADJUSTABLE LANCING DEVICE) MISC     . metoprolol tartrate (LOPRESSOR) 50 MG tablet TAKE 1 TABLET BY MOUTH TWICE A DAY (Patient not taking: Reported on 09/24/2017) 60 tablet 0    Blood pressure 109/77, pulse 64, temperature 98.8 F (37.1 C), resp. rate 18, height 5' 10.5" (1.791 m), weight 83.6 kg (184 lb 4.9 oz), SpO2 (!) 88 %. Physical Exam: Physical Exam  Constitutional: He appears well-developed. He is sleeping. He has a sickly appearance (chronically ill appearing). He is sedated.  HENT:  Head: Normocephalic and atraumatic.  Right Ear: External ear normal.  Left Ear: External ear normal.  Nose: Nose normal.  Mouth/Throat: Oropharynx is clear and moist and mucous membranes are normal. Abnormal dentition.  cortrak present  Eyes: Conjunctivae and lids are normal.  Pupils pinpoint but equal and round  Neck:  Trach present  Cardiovascular: Normal rate and regular rhythm.  Pulses:      Radial pulses are 1+ on the right side, and 1+ on the left side.       Dorsalis pedis pulses are 1+ on the right side, and 1+ on the left side.  Pulmonary/Chest: No stridor. Tachypnea noted. He has rhonchi in the right upper field, the right middle field, the left upper field and the left middle field. He has rales in the right lower field and the left lower field.  Abdominal: He exhibits distension. Bowel sounds  are decreased. There is no guarding. No hernia.  Well healed scar from past PEG  Genitourinary: Penis normal.  Genitourinary Comments: Foley present. Flexiseal present with liquid stool.   Musculoskeletal:  No gross deformities of bilateral upper and lower extremities  Neurological: He is unresponsive.  sedated  Skin: Skin is warm, dry and intact.  Psychiatric:  Not assessable    Results for orders placed or performed during the hospital encounter of 10/10/2017 (from the past 48 hour(s))  Glucose, capillary     Status: Abnormal   Collection Time: 09/28/17  3:48 PM  Result Value Ref Range   Glucose-Capillary 186 (H) 65 - 99 mg/dL  Glucose, capillary     Status: Abnormal  Collection Time: 09/28/17  7:39 PM  Result Value Ref Range   Glucose-Capillary 181 (H) 65 - 99 mg/dL  Glucose, capillary     Status: Abnormal   Collection Time: 09/28/17 11:18 PM  Result Value Ref Range   Glucose-Capillary 218 (H) 65 - 99 mg/dL  Glucose, capillary     Status: Abnormal   Collection Time: 09/29/17  3:20 AM  Result Value Ref Range   Glucose-Capillary 196 (H) 65 - 99 mg/dL  BMET in AM     Status: Abnormal   Collection Time: 09/29/17  4:12 AM  Result Value Ref Range   Sodium 135 135 - 145 mmol/L   Potassium 4.4 3.5 - 5.1 mmol/L   Chloride 108 101 - 111 mmol/L   CO2 20 (L) 22 - 32 mmol/L   Glucose, Bld 184 (H) 65 - 99 mg/dL   BUN 51 (H) 6 - 20 mg/dL   Creatinine, Ser 1.27 (H) 0.61 - 1.24 mg/dL   Calcium 8.0 (L) 8.9 - 10.3 mg/dL   GFR calc non Af Amer 52 (L) >60 mL/min   GFR calc Af Amer 60 (L) >60 mL/min    Comment: (NOTE) The eGFR has been calculated using the CKD EPI equation. This calculation has not been validated in all clinical situations. eGFR's persistently <60 mL/min signify possible Chronic Kidney Disease.    Anion gap 7 5 - 15    Comment: Performed at [facility] [address] [place], [place] [address]  CBC     Status: Abnormal   Collection Time: 09/29/17  4:12 AM   Result Value Ref Range   WBC 11.6 (H) 4.0 - 10.5 K/uL   RBC 3.31 (L) 4.22 - 5.81 MIL/uL   Hemoglobin 10.2 (L) 13.0 - 17.0 g/dL   HCT 31.3 (L) 39.0 - 52.0 %   MCV 94.6 78.0 - 100.0 fL   MCH 30.8 26.0 - 34.0 pg   MCHC 32.6 30.0 - 36.0 g/dL   RDW 16.2 (H) 11.5 - 15.5 %   Platelets 102 (L) 150 - 400 K/uL    Comment: REPEATED TO VERIFY CONSISTENT WITH PREVIOUS RESULT Performed at [facility], [place] [address], [place], [place] [address]   Magnesium     Status: Abnormal   Collection Time: 09/29/17  4:12 AM  Result Value Ref Range   Magnesium 1.5 (L) 1.7 - 2.4 mg/dL    Comment: Performed at [facility] [address], [place], [place] [address]  Phosphorus     Status: Abnormal   Collection Time: 09/29/17  4:12 AM  Result Value Ref Range   Phosphorus 2.0 (L) 2.5 - 4.6 mg/dL    Comment: Performed at [facility] [address], [place], [place] [address]  Glucose, capillary     Status: Abnormal   Collection Time: 09/29/17  7:34 AM  Result Value Ref Range   Glucose-Capillary 191 (H) 65 - 99 mg/dL  Glucose, capillary     Status: Abnormal   Collection Time: 09/29/17 11:35 AM  Result Value Ref Range   Glucose-Capillary 165 (H) 65 - 99 mg/dL  Glucose, capillary     Status: Abnormal   Collection Time: 09/29/17  3:52 PM  Result Value Ref Range   Glucose-Capillary 171 (H) 65 - 99 mg/dL  Glucose, capillary     Status: Abnormal   Collection Time: 09/29/17  7:19 PM  Result Value Ref Range   Glucose-Capillary 170 (H) 65 - 99 mg/dL  Basic  metabolic panel     Status: Abnormal   Collection Time: 09/29/17  8:16 PM  Result Value Ref Range   Sodium 132 (L) 135 - 145 mmol/L   Potassium 4.1 3.5 - 5.1 mmol/L   Chloride 103 101 - 111 mmol/L   CO2 19 (L) 22 - 32 mmol/L   Glucose, Bld 182 (H) 65 - 99 mg/dL   BUN 57 (H) 6 - 20 mg/dL   Creatinine, Ser 1.29 (H) 0.61 - 1.24 mg/dL   Calcium 8.0 (L) 8.9 - 10.3 mg/dL   GFR calc non Af Amer 51 (L) >60 mL/min   GFR calc Af Amer 59 (L)  >60 mL/min    Comment: (NOTE) The eGFR has been calculated using the CKD EPI equation. This calculation has not been validated in all clinical situations. eGFR's persistently <60 mL/min signify possible Chronic Kidney Disease.    Anion gap 10 5 - 15    Comment: Performed at [facility] [address] [place], [place] [address]  Magnesium     Status: None   Collection Time: 09/29/17  8:16 PM  Result Value Ref Range   Magnesium 1.8 1.7 - 2.4 mg/dL    Comment: Performed at [facility] [address] [place], [place] [address]  Phosphorus     Status: None   Collection Time: 09/29/17  8:16 PM  Result Value Ref Range   Phosphorus 4.2 2.5 - 4.6 mg/dL    Comment: Performed at [facility] [address], [place], [place] [address]  Glucose, capillary     Status: Abnormal   Collection Time: 09/29/17 11:20 PM  Result Value Ref Range   Glucose-Capillary 167 (H) 65 - 99 mg/dL  Glucose, capillary     Status: Abnormal   Collection Time: 09/30/17  3:27 AM  Result Value Ref Range   Glucose-Capillary 106 (H) 65 - 99 mg/dL  CBC     Status: Abnormal   Collection Time: 09/30/17  4:33 AM  Result Value Ref Range   WBC 14.3 (H) 4.0 - 10.5 K/uL   RBC 3.22 (L) 4.22 - 5.81 MIL/uL   Hemoglobin 10.0 (L) 13.0 - 17.0 g/dL   HCT 30.3 (L) 39.0 - 52.0 %   MCV 94.1 78.0 - 100.0 fL   MCH 31.1 26.0 - 34.0 pg   MCHC 33.0 30.0 - 36.0 g/dL   RDW 16.8 (H) 11.5 - 15.5 %   Platelets 118 (L) 150 - 400 K/uL    Comment: REPEATED TO VERIFY CONSISTENT WITH PREVIOUS RESULT Performed at [facility], [place] [address], [place], [place] [address]   Basic metabolic panel     Status: Abnormal   Collection Time: 09/30/17  4:33 AM  Result Value Ref Range   Sodium 135 135 - 145 mmol/L   Potassium 4.5 3.5 - 5.1 mmol/L   Chloride 104 101 - 111 mmol/L   CO2 19 (L) 22 - 32 mmol/L   Glucose, Bld 96 65 - 99 mg/dL   BUN 60 (H) 6 - 20 mg/dL   Creatinine, Ser 1.44 (H) 0.61 - 1.24 mg/dL   Calcium 8.2  (L) 8.9 - 10.3 mg/dL   GFR calc non Af Amer 44 (L) >60 mL/min   GFR calc Af Amer 51 (L) >60 mL/min    Comment: (NOTE) The eGFR has been calculated using the CKD EPI equation. This calculation has not been validated in all clinical situations. eGFR's persistently <60 mL/min signify possible Chronic Kidney  Disease.    Anion gap 12 5 - 15    Comment: Performed at [facility] [address], [place], [place] [address]  Magnesium     Status: None   Collection Time: 09/30/17  4:33 AM  Result Value Ref Range   Magnesium 1.9 1.7 - 2.4 mg/dL    Comment: Performed at [facility] [address], [place], [place] [address]  Phosphorus     Status: None   Collection Time: 09/30/17  4:33 AM  Result Value Ref Range   Phosphorus 3.7 2.5 - 4.6 mg/dL    Comment: Performed at [facility] [address] [place],  [address]  Glucose, capillary     Status: Abnormal   Collection Time: 09/30/17  7:19 AM  Result Value Ref Range   Glucose-Capillary 59 (L) 65 - 99 mg/dL  Glucose, capillary     Status: Abnormal   Collection Time: 09/30/17  7:57 AM  Result Value Ref Range   Glucose-Capillary 163 (H) 65 - 99 mg/dL  Glucose, capillary     Status: Abnormal   Collection Time: 09/30/17 11:31 AM  Result Value Ref Range   Glucose-Capillary 109 (H) 65 - 99 mg/dL   Dg Chest Port 1 View  Result Date: 09/30/2017 CLINICAL DATA:  Hypoxia EXAM: PORTABLE CHEST 1 VIEW COMPARISON:  September 29, 2017 FINDINGS: Endotracheal tube tip is 3.5 cm above the carina. Nasogastric tube and feeding tube tips are below the diaphragm. Central catheter tip is in the superior vena cava near the cavoatrial junction. No pneumothorax. There are bilateral pleural effusions with patchy airspace consolidation in both lower lobes. Heart is mildly enlarged with pulmonary vascularity normal. Patient is status post coronary artery bypass grafting. There is aortic atherosclerosis. No evident bone lesions. IMPRESSION: Tube and  catheter positions as described. Small pneumothorax seen previously on the right is no longer evident. There are bilateral pleural effusions with airspace consolidation in both lower lobes. Suspect a degree of pneumonia in the lung bases, although alveolar edema could present in this manner. Both entities may be present concurrently. Stable cardiac prominence. There is aortic atherosclerosis. Aortic Atherosclerosis (ICD10-I70.0). Electronically Signed   By: Lowella Grip III M.D.   On: 09/30/2017 07:06   Dg Chest Port 1 View  Result Date: 09/29/2017 CLINICAL DATA:  Endotracheal tube EXAM: PORTABLE CHEST 1 VIEW COMPARISON:  09/28/2017 FINDINGS: Endotracheal tube in good position. Feeding tube in place with the tip not visualized but entering the stomach. NG tube also enters the stomach. Right jugular central venous catheter tip at the cavoatrial junction unchanged. Probable small right apical pneumothorax. Recommend follow-up for confirmation. Bibasilar atelectasis and effusion unchanged.  Negative for edema. IMPRESSION: Endotracheal tube remains in good position. Bibasilar atelectasis and effusion unchanged Probable small right apical pneumothorax. Recommend follow-up chest x-ray to confirm. Electronically Signed   By: Franchot Gallo M.D.   On: 09/29/2017 08:47      Assessment/Plan Acute hypoxemic respiratory failure in setting of CAP vs aspiration  - s/p trach 6/12 Septic shock - now off pressors AKI Metabolic acidosis with metabolic encephalopathy - sedated, per notes follows commands on wake up assessments Hx of HTN Hx of CAD s/p CABG Hx of PAF s/p ablation  Troponin elevation due to demand ischemia Systolic CHF - EF now 18-29% with diffuse hypokinesis Chronic anemia and thrombocytopenia - Hgb 10.0, platelets 110 T2DM - SSI  PEG placement - abdomen very distended, concern for possible ascites given EtOH abuse -  Patient has had past PEG placed by Dr. Cristina Gong, recommend consult to him to  see if he would place - if GI unable to place PEG will need CT abdomen prior to considering procedure  Please reconsult if any questions or concerns.   Brigid Re, [facility] Surgery 09/30/2017, 1:32 PM Pager: [phone] Mon-Fri 7:00 am-4:30 pm Sat-Sun 7:00 am-11:30 am

## 2017-09-30 NOTE — Procedures (Addendum)
Bronchoscopy Procedure Note Johnny [name] [id number] 01-06-1935  Procedure: Bronchoscopy Indications: Tracheostomy placement  Procedure Details Consent: Placement of tracheostomy Time Out: Verified patient identification, verified procedure, site/side was marked, verified correct patient position, special equipment/implants available, medications/allergies/relevent history reviewed, required imaging and test results available.  Performed  In preparation for procedure, patient was given 100% FiO2 and bronchoscope lubricated. Sedation: Fentanyl, versed, etomidate, vecuronium   Airway entered and the upper airway inspected to the level of the carina   Procedures performed: Brushings performed Bronchoscope removed.    Evaluation Hemodynamic Status: BP stable throughout; O2 sats: stable throughout Patient's Current Condition: stable Specimens:  none Complications: No apparent complications Patient did tolerate procedure well.   Procedure performed under direct supervision of Dr. Molli KnockYacoub.   Canary BrimBrandi Keeghan Mcintire, NP-C [facility] Pgr: [phone] or if no answer [phone] 09/30/2017, 2:01 PM

## 2017-09-30 NOTE — Care Management Note (Addendum)
Case Management Note  Patient Details  Name: Johnny [name] MRN: [id number] Date of Birth: [birth year]  Subjective/Objective:  Pt admitted with sepsis.  Pt required intubation and was trached today, IR has been consulted for PEG.  Pt still requiring sedation and IV antibiotics. CSW consulted for substance abuse and likely SNF placement.   CM will continue to follow for discharge needs                  Action/Plan:   Expected Discharge Date:                  Expected Discharge Plan:     In-House Referral:  Clinical Social Work  Discharge planning Services     Post Acute Care Choice:    Choice offered to:     DME Arranged:    DME Agency:     HH Arranged:    HH Agency:     Status of Service:     If discussed at [employer] Meetings, dates discussed:    Additional Comments:  Cherylann ParrClaxton, Marcille Barman S, RN 09/30/2017, 12:10 PM

## 2017-10-01 ENCOUNTER — Inpatient Hospital Stay (HOSPITAL_COMMUNITY): Payer: Medicare HMO

## 2017-10-01 DIAGNOSIS — I248 Other forms of acute ischemic heart disease: Secondary | ICD-10-CM

## 2017-10-01 DIAGNOSIS — I5043 Acute on chronic combined systolic (congestive) and diastolic (congestive) heart failure: Secondary | ICD-10-CM

## 2017-10-01 LAB — POCT I-STAT 3, VENOUS BLOOD GAS (G3P V)
ACID-BASE DEFICIT: 10 mmol/L — AB (ref 0.0–2.0)
BICARBONATE: 16.7 mmol/L — AB (ref 20.0–28.0)
O2 Saturation: 36 %
PH VEN: 7.266 (ref 7.250–7.430)
TCO2: 18 mmol/L — AB (ref 22–32)
pCO2, Ven: 36.4 mmHg — ABNORMAL LOW (ref 44.0–60.0)
pO2, Ven: 24 mmHg — CL (ref 32.0–45.0)

## 2017-10-01 LAB — CBC
HEMATOCRIT: 30.1 % — AB (ref 39.0–52.0)
HEMOGLOBIN: 9.6 g/dL — AB (ref 13.0–17.0)
MCH: 30.9 pg (ref 26.0–34.0)
MCHC: 31.9 g/dL (ref 30.0–36.0)
MCV: 96.8 fL (ref 78.0–100.0)
Platelets: 129 10*3/uL — ABNORMAL LOW (ref 150–400)
RBC: 3.11 MIL/uL — AB (ref 4.22–5.81)
RDW: 16.8 % — AB (ref 11.5–15.5)
WBC: 12.1 10*3/uL — AB (ref 4.0–10.5)

## 2017-10-01 LAB — GLUCOSE, CAPILLARY
GLUCOSE-CAPILLARY: 206 mg/dL — AB (ref 65–99)
GLUCOSE-CAPILLARY: 122 mg/dL — AB (ref 65–99)
GLUCOSE-CAPILLARY: 199 mg/dL — AB (ref 65–99)
Glucose-Capillary: 157 mg/dL — ABNORMAL HIGH (ref 65–99)
Glucose-Capillary: 195 mg/dL — ABNORMAL HIGH (ref 65–99)
Glucose-Capillary: 204 mg/dL — ABNORMAL HIGH (ref 65–99)

## 2017-10-01 LAB — BASIC METABOLIC PANEL
ANION GAP: 9 (ref 5–15)
BUN: 75 mg/dL — ABNORMAL HIGH (ref 6–20)
CALCIUM: 7.7 mg/dL — AB (ref 8.9–10.3)
CHLORIDE: 106 mmol/L (ref 101–111)
CO2: 18 mmol/L — AB (ref 22–32)
Creatinine, Ser: 1.58 mg/dL — ABNORMAL HIGH (ref 0.61–1.24)
GFR calc non Af Amer: 40 mL/min — ABNORMAL LOW (ref >60)
GFR, EST AFRICAN AMERICAN: 46 mL/min — AB (ref >60)
GLUCOSE: 206 mg/dL — AB (ref 65–99)
POTASSIUM: 3.9 mmol/L (ref 3.5–5.1)
Sodium: 133 mmol/L — ABNORMAL LOW (ref 135–145)

## 2017-10-01 LAB — MAGNESIUM: Magnesium: 2 mg/dL (ref 1.7–2.4)

## 2017-10-01 LAB — COOXEMETRY PANEL
CARBOXYHEMOGLOBIN: 0.9 % (ref 0.5–1.5)
METHEMOGLOBIN: 1.6 % — AB (ref 0.0–1.5)
O2 Saturation: 60.4 %
Total hemoglobin: 9.2 g/dL — ABNORMAL LOW (ref 12.0–16.0)

## 2017-10-01 LAB — PHOSPHORUS: PHOSPHORUS: 4 mg/dL (ref 2.5–4.6)

## 2017-10-01 MED ORDER — ONE-DAILY MULTI VITAMINS PO TABS: 1.0000 | ORAL_TABLET | Freq: Every day | ORAL | Status: DC

## 2017-10-01 MED ORDER — CLOPIDOGREL BISULFATE 75 MG PO TABS
75.0000 mg | ORAL_TABLET | Freq: Every day | ORAL | Status: DC
  Administered 2017-10-01 – 2017-10-21 (×21): 75 mg
  Filled 2017-10-01 (×21): qty 1

## 2017-10-01 MED ORDER — ADULT MULTIVITAMIN W/MINERALS CH
1.0000 | ORAL_TABLET | Freq: Every day | ORAL | Status: DC
  Administered 2017-10-01 – 2017-10-25 (×25): 1
  Filled 2017-10-01 (×25): qty 1

## 2017-10-01 MED ORDER — ORAL CARE MOUTH RINSE
15.0000 mL | OROMUCOSAL | Status: DC
  Administered 2017-10-01 – 2017-10-25 (×237): 15 mL via OROMUCOSAL

## 2017-10-01 MED ORDER — ATORVASTATIN CALCIUM 20 MG PO TABS
20.0000 mg | ORAL_TABLET | Freq: Every day | ORAL | Status: DC
  Administered 2017-10-01 – 2017-10-25 (×25): 20 mg
  Filled 2017-10-01 (×27): qty 1

## 2017-10-01 MED ORDER — THIAMINE HCL 100 MG PO TABS: 100.0000 mg | ORAL_TABLET | Freq: Every day | ORAL | Status: DC

## 2017-10-01 MED ORDER — ATORVASTATIN CALCIUM 20 MG PO TABS: 20.0000 mg | ORAL_TABLET | Freq: Every day | ORAL | Status: DC

## 2017-10-01 MED ORDER — ADULT MULTIVITAMIN W/MINERALS CH: 1.0000 | ORAL_TABLET | Freq: Every day | ORAL | Status: DC

## 2017-10-01 MED ORDER — MIDAZOLAM HCL 2 MG/2ML IJ SOLN
2.0000 mg | Freq: Once | INTRAMUSCULAR | Status: AC
  Administered 2017-10-01: 2 mg via INTRAVENOUS

## 2017-10-01 MED ORDER — ASPIRIN 81 MG PO TABS: 81.0000 mg | ORAL_TABLET | Freq: Every day | ORAL | Status: DC

## 2017-10-01 MED ORDER — FUROSEMIDE 10 MG/ML IJ SOLN
40.0000 mg | Freq: Two times a day (BID) | INTRAMUSCULAR | Status: DC
  Administered 2017-10-01 – 2017-10-02 (×2): 40 mg via INTRAVENOUS
  Filled 2017-10-01 (×2): qty 4

## 2017-10-01 MED ORDER — CHLORHEXIDINE GLUCONATE 0.12% ORAL RINSE (MEDLINE KIT)
15.0000 mL | Freq: Two times a day (BID) | OROMUCOSAL | Status: DC
  Administered 2017-10-01 – 2017-10-25 (×49): 15 mL via OROMUCOSAL

## 2017-10-01 MED ORDER — SODIUM BICARBONATE 650 MG PO TABS
650.0000 mg | ORAL_TABLET | Freq: Four times a day (QID) | ORAL | Status: AC
  Administered 2017-10-01 – 2017-10-02 (×4): 650 mg
  Filled 2017-10-01 (×4): qty 1

## 2017-10-01 MED ORDER — CLOPIDOGREL BISULFATE 75 MG PO TABS: 75.0000 mg | ORAL_TABLET | Freq: Every day | ORAL | Status: DC

## 2017-10-01 MED ORDER — FOLIC ACID 1 MG PO TABS: 1.0000 mg | ORAL_TABLET | Freq: Every day | ORAL | Status: DC

## 2017-10-01 NOTE — Progress Notes (Signed)
PULMONARY / CRITICAL CARE MEDICINE   Name: Johnny [name] MRN: [id number] DOB: 1934-06-12    ADMISSION DATE:  10/16/2017  REFERRING MD:  Dr. Suzi RootsMilled EDP  CHIEF COMPLAINT:  Shock  HISTORY OF PRESENT ILLNESS:   83 yo male former smoker with respiratory failure and altered mental status after alcohol binge.  Found to have pneumonia, septic shock and intubated in ER. PMHx ETOH, Withdrawal seizures, CAD s/p CABG, HFrEF, PAF, DM, CVA, HLD, HTN.  SUBJECTIVE:   Remains on full vent support.  VITAL SIGNS: BP (!) 94/51   Pulse (!) 104   Temp 98.2 F (36.8 C)   Resp (!) 25   Ht 5' 10.5" (1.791 m)   Wt 184 lb 15.5 oz (83.9 kg)   SpO2 100%   BMI 26.16 kg/m   HEMODYNAMICS: CVP:  [3 mmHg-15 mmHg] 14 mmHg  VENTILATOR SETTINGS: Vent Mode: PRVC FiO2 (%):  [50 %-60 %] 60 % Set Rate:  [14 bmp] 14 bmp Vt Set:  [590 mL] 590 mL PEEP:  [5 cmH20-12 cmH20] 12 cmH20 Plateau Pressure:  [20 cmH20-33 cmH20] 30 cmH20  INTAKE / OUTPUT: I/O last 3 completed shifts: In: 2932.5 [I.V.:920.3; NG/GT:2012.2] Out: 1890 [Urine:1890]  PHYSICAL EXAMINATION:   General - ill appearing Eyes - pupils reactive ENT - trach site clean Cardiac - irregular, tachycardic, no murmur Chest - decreased BS at bases Abd - soft, non tender Ext - 1+ edema Skin - no rashes Neuro - opens eyes with stimulation   LABS:  BMET Recent Labs  Lab 09/29/17 2016 09/30/17 0433 10/01/17 0418  NA 132* 135 133*  K 4.1 4.5 3.9  CL 103 104 106  CO2 19* 19* 18*  BUN 57* 60* 75*  CREATININE 1.29* 1.44* 1.58*  GLUCOSE 182* 96 206*   Electrolytes Recent Labs  Lab 09/29/17 2016 09/30/17 0433 10/01/17 0418  CALCIUM 8.0* 8.2* 7.7*  MG 1.8 1.9 2.0  PHOS 4.2 3.7 4.0   CBC Recent Labs  Lab 09/29/17 0412 09/30/17 0433 10/01/17 0418  WBC 11.6* 14.3* 12.1*  HGB 10.2* 10.0* 9.6*  HCT 31.3* 30.3* 30.1*  PLT 102* 118* 129*   Coag's No results for input(s): APTT, INR in the last 168 hours.  Sepsis Markers Recent Labs   Lab 09/24/17 1330 09/25/17 1808 09/26/17 0521 09/27/17 0331  LATICACIDVEN 1.6  --   --   --   PROCALCITON  --  0.57 0.45 0.35   ABG No results for input(s): PHART, PCO2ART, PO2ART in the last 168 hours.   Liver Enzymes Recent Labs  Lab 09/26/17 0521  AST 90*  ALT 86*  ALKPHOS 141*  BILITOT 1.3*  ALBUMIN 2.6*   Cardiac Enzymes Recent Labs  Lab 09/24/17 1330 09/25/17 0538  TROPONINI 0.28* 0.17*   Glucose Recent Labs  Lab 09/30/17 1131 09/30/17 1527 09/30/17 2013 10/01/17 0034 10/01/17 0357 10/01/17 0755  GLUCAP 109* 95 163* 204* 206* 195*   Imaging Dg Chest Port 1 View  Result Date: 10/01/2017 CLINICAL DATA:  Endotracheal tube placement EXAM: PORTABLE CHEST 1 VIEW COMPARISON:  09/30/2017 FINDINGS: Tracheostomy in good position. Central venous catheter tip in the SVC unchanged. Feeding tube enters the stomach with the tip not visualized. Bibasilar airspace disease left greater than right is unchanged. Small bilateral effusions also unchanged. Negative for edema. IMPRESSION: Bibasilar airspace disease and bilateral pleural effusions left greater than right unchanged from the prior study. Electronically Signed   By: Marlan Palauharles  Clark M.D.   On: 10/01/2017 07:21   Dg Chest  Port 1 View  Result Date: 09/30/2017 CLINICAL DATA:  Tracheostomy status.  Pleural effusions. EXAM: PORTABLE CHEST 1 VIEW 3:15 p.m. COMPARISON:  Chest x-ray dated 09/30/2017 at 4:58 a.m. and chest x-ray dated 09/29/2017 FINDINGS: Since the prior study the endotracheal tube has been removed and a tracheostomy tube has been inserted and appears in good position in the AP projection. Central catheter tip is at the cavoatrial junction. Feeding tube tip is below the diaphragm. Bilateral pleural effusions are less prominent than on the prior study of this same date. Chronic cardiomegaly. Aortic atherosclerosis. Pulmonary vascularity appears normal. CABG. IMPRESSION: 1. Tracheostomy tube appears in good position. 2.  Decreased bilateral pleural effusions. Haziness in the lung bases has resolved and may have represented mild pulmonary edema. 3.  Aortic Atherosclerosis (ICD10-I70.0). Electronically Signed   By: Francene Boyers M.D.   On: 09/30/2017 15:41    STUDIES:  CT head 6/05 >> atrophy, chronic small vessel ischemia Echo 6/06 >> EF 20 to 25%  CULTURES: Blood 6/05 >> negative  ANTIBIOTICS: Cefepime 6/5 > 6/10 Vanco 6/5 > 6/12  SIGNIFICANT EVENTS: 6/05 admit  LINES/TUBES: ETT 6/5>>>6/12 Rt IJ CVL 6/5>>> Trach 6/12>>>  DISCUSSION: 83 yo with aspiration pneumonia, acute hypoxic respiratory failure, acute on chronic systolic CHF with HFrEF in setting of alcoholic binge.  Failed to wean and required trach.  ASSESSMENT / PLAN:  Acute hypoxic respiratory failure from aspiration pneumonia, pulmonary edema, and pleural effusions. Failure to wean s/p trach. - vent settings adjusted - f/u CXR  Acute on chronic systolic CHF with HFrEF. CAD s/p CABG, A fib/flutter. - consult heart failure team 6/13 - ASA, plavix, lipitor - hold outpt lopressor with low BP  Acute renal failure from ATN. Non gap acidosis. - monitor renal fx - add HCO3 q6h x 4 doses - f/u BMET  Anemia of critical illness and chronic disease. Thrombocytopenia in setting of ETOH. - f/u CBC  DM type II. - SSI with lantus  Acute metabolic encephalopathy. Hx of ETOH. - RASS goal 0 to -1  - thiamine, folic acid, MVI  DVT - SQ heparin SUP - pepcid Nutrition - tube feeds Goals of care - tube feeds  CC time 32 minutes  Coralyn Helling, MD [facility]Critical Care 10/01/2017, 9:19 AM

## 2017-10-01 NOTE — Progress Notes (Signed)
ABG attempted and venous blood obtained instead of arterial.

## 2017-10-01 NOTE — Consult Note (Addendum)
Cardiology Consultation:   Patient ID: Johnny [name]; [id number]; 08/05/1934   Admit date: 10/06/2017 Date of Consult: 10/01/2017  Primary Care Provider: Marletta Lor, MD Primary Cardiologist: Dr. Peter Martinique, MD  Patient Profile:   Johnny [name] is a 83 y.o. male with a hx of systolic CHF (ICM, LVEF 45% ), HTN, HLD, DM 2, prior EtOH abuse quit in 10/2015, CVA, CAD s/p CABG, PAF/aflutter s/p ablation 2009 and PEA arrest in 04/2012 in the setting of withdrawal seizure significant for  who is being seen today for the evaluation of worsening systolic heart failure at the request of Dr. Halford Chessman.  History of Present Illness:   Johnny [name] is an 83yo M with a hx as stated above who presented to [facility] ED on 09/20/2017 after being on a several day alcohol binge however, seemed to be at baseline per family (in wheelchair).  He reportedly had a productive cough and on 09/27/2017 he developed respiratory distress and altered mental status while family was trying to bathe him. Given the above symptoms, patient was transported to [facility] ED for further evaluation. Information obtained from chart review as pt remains with tracheostomy and on precedex and no family present at bedside.   In the ED, he was found to have an O2 saturation of 50% and was hypotensive requiring intubation per PCCM and vasopressor support. Over the course of several days, the patient was stabilized and treated for acute hypoxic respiratory failure in the setting of CAP versus aspiration pneumonia and sepsis versus hypovolemic shock.  Additionally he has been treated for AKI, acute metabolic encephalopathy with normal head CT, anemia, thrombocytopenia and metabolic acidosis. Given his history, an echocardiogram was performed 09/24/2017 which revealed a further decrease in his LV function, now down to 20 to 25% with diffuse hypokinesis. Prior echocardiogram on 11/18/2016 revealed EF of 40 to 45% however, hypokinesis of the inferior lateral myocardium was  present at that time.  His last cardiac cath was on 11/17/2016 revealed severe three-vessel disease, severe native CAD beyond the insertions of the grafts, considered not amenable to PCI.  LV gram felt to be consistent with Takotsubo variant pattern with akinesis at the base and normal contraction of the apex.  Unfortunately, over his hospital course, he has not been able to wean appropriately off the ventilator therefore, a tracheostomy was placed per PCCM on 09/30/2017. Additionally, a PEG was placed on the same day in anticipation of SNF placement at discharge.  Of note, we first met Johnny [name] when he was admitted to the hospital on 11/16/2016 with exertional chest pressure with an initial troponin of 1.17. EKG showed RBBB with left anterior fascicular block which was unchanged from prior tracings. Cardiac catheterization was performed on 11/17/2016 which showed 100% occluded ostial left circumflex, sequential SVG to OM's were patent, 99% ostial LAD followed by 100% mid LAD lesion with patent LIMA to LAD, 75% distal LAD lesion, 99% ostial D1 with patent SVG to diagonal, 100% occluded mid RCA with patent SVG to PDA.  EF was visually estimated at 35 to 45%.    There was felt to be no culprit lesion identified and the severe native CAD beyond the insertion of the graft were not amenable to PCI. LV gram at that time showed a considerable variant pattern with akinesis at the base with normal contraction and apex. Echocardiogram obtained on 11/18/2016 showed EF 40 to 45% with grade 1 diastolic dysfunction, no evidence of thrombus, mild MR.  Given the  above, Plavix was added to his regimen.    He was last seen in follow-up with [name] on 05/05/2017 which he stated he noted some dyspnea when walking in his house however, his activity level was considered very limited.  He denied palpitations, dizziness, cough, edema or claudication.  Cardiology was consulted given his further decrease in EF.   Past Medical  History:  Diagnosis Date  . Acute alcoholic hepatitis 4/35/6861  . Altered mental status 04/01/2012  . Anemia of other chronic disease 08/16/2007  . Arthritis   . CAD (coronary artery disease)    a. s/p CABG, notes unclear - 1998 or 2003?  Marland Kitchen Chronic low back pain   . Coagulopathy ([place])   . Diabetes mellitus ([place])   . Essential hypertension   . ETOH abuse   . Hallucination 04/01/2012  . History of pneumonia   . History of stroke   . Hyperlipidemia   . Paroxysmal atrial fibrillation (HCC)   . Paroxysmal atrial flutter ([place])    a. s/p ablation 2007.  Marland Kitchen PEA (Pulseless electrical activity) ([place])    a. h/o PEA cardiac arrest 04/2012 (felt r/t PNA, acidosis, hypoxic resp failure in setting of alcohol withdrawal; required tracheostomy)  . Protein calorie malnutrition ([place])   . Seizures ([place]) 04/01/2012    Past Surgical History:  Procedure Laterality Date  . CATARACT EXTRACTION W/ INTRAOCULAR LENS  IMPLANT, BILATERAL     "years ago" (04/01/2012)  . CORONARY ARTERY BYPASS GRAFT  ~ 2003   CABG X4  . ESOPHAGOGASTRODUODENOSCOPY  04/13/2012   Procedure: ESOPHAGOGASTRODUODENOSCOPY (EGD);  Surgeon: Cleotis Nipper, MD;  Location: [facility] ENDOSCOPY;  Service: Endoscopy;  Laterality: N/A;  push peg  . LEFT HEART CATH AND CORS/GRAFTS ANGIOGRAPHY N/A 11/17/2016   Procedure: Left Heart Cath and Cors/Grafts Angiography;  Surgeon: Jettie Booze, MD;  Location: [place] CV LAB;  Service: Cardiovascular;  Laterality: N/A;  . PEG PLACEMENT  04/13/2012   Procedure: PERCUTANEOUS ENDOSCOPIC GASTROSTOMY (PEG) PLACEMENT;  Surgeon: Cleotis Nipper, MD;  Location: [facility] ENDOSCOPY;  Service: Endoscopy;  Laterality: N/A;     Prior to Admission medications   Medication Sig Start Date End Date Taking? Authorizing Provider  acetaminophen (TYLENOL) 325 MG tablet Take 2 tablets (650 mg total) by mouth every 6 (six) hours as needed for mild pain (or Fever >/= 101). 05/08/15  Yes Robbie Lis, MD  aspirin 81 MG  tablet Take 81 mg by mouth daily.   Yes [provider]  atorvastatin (LIPITOR) 20 MG tablet Take 1 tablet (20 mg total) by mouth daily. 09/18/17  Yes Marletta Lor, MD  clopidogrel (PLAVIX) 75 MG tablet TAKE 1 TABLET BY MOUTH EVERY DAY 01/23/17  Yes Marletta Lor, MD  folic acid (FOLVITE) 1 MG tablet Take 1 tablet (1 mg total) by mouth daily. 01/29/17  Yes Marletta Lor, MD  metFORMIN (GLUCOPHAGE) 500 MG tablet TAKE 1 TABLET BY MOUTH EVERY DAY WITH BREAKFAST 09/24/17  Yes Marletta Lor, MD  metoprolol tartrate (LOPRESSOR) 50 MG tablet Take 1.5 tablets (75 mg total) by mouth 2 (two) times daily. 09/15/17  Yes Marletta Lor, MD  Multiple Vitamin (MULTIVITAMIN) tablet Take 1 tablet by mouth daily. Reported on 11/08/2015   Yes [provider]  nitroGLYCERIN (NITROSTAT) 0.4 MG SL tablet Place 1 tablet (0.4 mg total) under the tongue every 5 (five) minutes as needed for chest pain. Up to 3 doses. 11/18/16  Yes Hongalgi, Lenis Dickinson, MD  tamsulosin [facility])  0.4 MG CAPS capsule TAKE 1 CAPSULE (0.4 MG TOTAL) BY MOUTH DAILY. 10/13/16  Yes Marletta Lor, MD  thiamine ([facility] B-1) 100 MG tablet Take 1 tablet (100 mg total) by mouth daily. 01/23/17  Yes Marletta Lor, MD  ACCU-CHEK AVIVA PLUS test strip USE TO TEST BLOOD SUGAR ONCE A DAY 06/25/15   Marletta Lor, MD  ACCU-CHEK SOFTCLIX LANCETS lancets USE 1 EACH BY OTHER ROUTE DAILY AS NEEDED FOR OTHER. 11/28/15   Marletta Lor, MD  Blood Glucose Calibration (ACCU-CHEK AVIVA) [place]  06/09/16   [provider]  Lancet Devices (ADJUSTABLE LANCING DEVICE) [place]  03/24/16   [provider]   Inpatient Medications: Scheduled Meds: . aspirin  81 mg Per Tube Daily  . atorvastatin  20 mg Per Tube Daily  . chlorhexidine gluconate (MEDLINE KIT)  15 mL Mouth Rinse BID  . clopidogrel  75 mg Per Tube Daily  . famotidine  20 mg Per Tube Daily  . folic acid  1 mg Per Tube Daily  . heparin  5,000  Units Subcutaneous Q8H  . insulin aspart  0-15 Units Subcutaneous Q4H  . insulin glargine  5 Units Subcutaneous QHS  . mouth rinse  15 mL Mouth Rinse 10 times per day  . multivitamin with minerals  1 tablet Per Tube Daily  . sodium bicarbonate  650 mg Per Tube Q6H  . thiamine  100 mg Per Tube Daily   Continuous Infusions: . dexmedetomidine (PRECEDEX) IV infusion 1.4 mcg/kg/hr (10/01/17 1321)  . feeding supplement (VITAL AF 1.2 CAL) 70 mL/hr at 10/01/17 1300   PRN Meds: docusate, fentaNYL (SUBLIMAZE) injection  Allergies:   No Known Allergies  Social History:   Social History   Socioeconomic History  . Marital status: Married    Spouse name: Not on file  . Number of children: Not on file  . Years of education: Not on file  . Highest education level: Not on file  Occupational History  . Not on file  Social Needs  . Financial resource strain: Not on file  . Food insecurity:    Worry: Not on file    Inability: Not on file  . Transportation needs:    Medical: Not on file    Non-medical: Not on file  Tobacco Use  . Smoking status: Former Smoker    Packs/day: 0.50    Years: 17.00    Pack years: 8.50    Types: Cigarettes    Last attempt to quit: 04/22/1983    Years since quitting: 34.4  . Smokeless tobacco: Never Used  . Tobacco comment: 04/01/2012 "quit smoking 20+ yr ago"  Substance and Sexual Activity  . Alcohol use: Yes    Alcohol/week: 10.8 oz    Types: 2 Glasses of wine, 16 Shots of liquor per week    Comment: hx of ETOH abuse; 04/01/2012 "drink ~ 1 pint//wk; vodka; cup of wine/wk" 04/2015 40 oz wine per day  . Drug use: No  . Sexual activity: Never  Lifestyle  . Physical activity:    Days per week: Not on file    Minutes per session: Not on file  . Stress: Not on file  Relationships  . Social connections:    Talks on phone: Not on file    Gets together: Not on file    Attends religious service: Not on file    Active member of club or organization: Not on  file    Attends meetings of clubs or organizations:  Not on file    Relationship status: Not on file  . Intimate partner violence:    Fear of current or ex partner: Not on file    Emotionally abused: Not on file    Physically abused: Not on file    Forced sexual activity: Not on file  Other Topics Concern  . Not on file  Social History Narrative  . Not on file    Family History:   Family History  Problem Relation Age of Onset  . Heart failure Mother   . Aneurysm Sister   . Suicidality Brother   . Heart failure Brother   . Diabetes Sister    Family Status:  Family Status  Relation Name Status  . Mother  Deceased       less than 57  . Father  Deceased at age 39       unclear causes  . Sister  Deceased  . Brother  Deceased  . Brother  Deceased  . Brother  Deceased       meningitis  . Sister  Alive  . Sister  Alive       healthy   ROS:  Please see the history of present illness.  All other ROS reviewed and negative.     Physical Exam/Data:   Vitals:   10/01/17 1000 10/01/17 1100 10/01/17 1200 10/01/17 1300  BP: (!) 127/105 (!) 110/56 (!) 126/97   Pulse: 95 72 74 (!) 103  Resp: (!) 24 (!) 26 (!) 26   Temp: (!) 97.2 F (36.2 C) 98.1 F (36.7 C) 98.4 F (36.9 C) 98.4 F (36.9 C)  TempSrc:      SpO2: 100% 100% 100% 100%  Weight:      Height:        Intake/Output Summary (Last 24 hours) at 10/01/2017 1350 Last data filed at 10/01/2017 1300 Gross per 24 hour  Intake 2257.03 ml  Output 1170 ml  Net 1087.03 ml   Filed Weights   09/29/17 0500 09/30/17 0456 10/01/17 0500  Weight: 182 lb 5.1 oz (82.7 kg) 184 lb 4.9 oz (83.6 kg) 184 lb 15.5 oz (83.9 kg)   Body mass index is 26.16 kg/m.   General: Ill-appearing, frail, NAD Skin: Warm, dry, intact  Head: Normocephalic, atraumatic, clear, moist mucus membranes. Neck: Negative for carotid bruits. No JVD Lungs: Bilateral rhonchi. No wheezes. Tracheostomy in placed with full vent support   Cardiovascular: RRR  with S1 S2. No murmurs, rubs or gallops Abdomen: Firm, non-tender, mildly distended with hypooactive bowel sounds. No obvious abdominal masses. MSK: Strength and tone appear decreased for age. Extremities: No edema. No clubbing or cyanosis. DP/PT pulses 1+ bilaterally Neuro: Open eyes to loud voice. Nods to questions. No focal deficits. No facial asymmetry. MAE spontaneously. Psych: Responds to voice with nods somewhat appropriately   EKG:  The EKG was personally reviewed and demonstrates:  09/29/17 ST with RBBB and first-degree AV block, unchanged from prior tracings Telemetry:  Telemetry was personally reviewed and demonstrates: NSR/ST   Relevant CV Studies:  ECHO: 09/24/17: Study Conclusions  - Left ventricle: The cavity size was severely dilated. Systolic   function was severely reduced. The estimated ejection fraction   was in the range of 20% to 25%. Diffuse hypokinesis. The study is   not technically sufficient to allow evaluation of LV diastolic   function. - Mitral valve: Severely calcified annulus. Moderately thickened   leaflets . There was mild regurgitation. - Left atrium: The atrium was mildly dilated.  Echocardiogram 11/18/16: Study Conclusions  - Left ventricle: The cavity size was normal. Wall thickness was   normal. Systolic function was mildly to moderately reduced. The   estimated ejection fraction was in the range of 40% to 45%.   Probable hypokinesis of the inferolateral myocardium. Probable   hypokinesis of the mid-apicalanteroseptal myocardium. Doppler   parameters are consistent with abnormal left ventricular   relaxation (grade 1 diastolic dysfunction). Acoustic contrast   opacification revealed no evidence ofthrombus. - Aortic valve: Noncoronary cusp mobility was severely restricted. - Mitral valve: Calcified annulus. Mildly thickened leaflets .   There was mild regurgitation.  CATH: 11/17/16:  Ost Cx to Prox Cx lesion, 100 %stenosed. Sequential  SVG to OMs is patent.  Ost LAD to Prox LAD lesion, 99 %stenosed, Mid LAD lesion, 100 %stenosed. LIMA to LAD is patent. Diffuse Dist LAD lesion, 75 %stenosed.  Ost 1st Diag to 1st Diag lesion, 99 %stenosed, diffusely beyond graft insertion. SVG to diagonal is patent.  Mid RCA lesion, 100 %stenosed. SVG to PDA is patent. RPDA lesion, 60 %stenosed diffusely beyond graft insertion.  LIMA and is anatomically normal.  There is moderate left ventricular systolic dysfunction.  The left ventricular ejection fraction is 35-45% by visual estimate.  There is mild (2+) mitral regurgitation.  There is no aortic valve stenosis.   Severe three vessel disease.  Severe native CAD beyond the insertions of the grafts, which is not amenable to PCI.  Elevated troponin likely from small vessel disease.    LV gram shows a Takotsubo variant pattern with akinesis at the base and normal contraction at the apex.    Laboratory Data:  Chemistry Recent Labs  Lab 09/29/17 2016 09/30/17 0433 10/01/17 0418  NA 132* 135 133*  K 4.1 4.5 3.9  CL 103 104 106  CO2 19* 19* 18*  GLUCOSE 182* 96 206*  BUN 57* 60* 75*  CREATININE 1.29* 1.44* 1.58*  CALCIUM 8.0* 8.2* 7.7*  GFRNONAA 51* 44* 40*  GFRAA 59* 51* 46*  ANIONGAP '10 12 9    ' Total Protein  Date Value Ref Range Status  09/26/2017 6.3 (L) 6.5 - 8.1 g/dL Final  05/05/2017 6.8 6.0 - 8.5 g/dL Final   Albumin  Date Value Ref Range Status  09/26/2017 2.6 (L) 3.5 - 5.0 g/dL Final  05/05/2017 3.7 3.5 - 4.7 g/dL Final   AST  Date Value Ref Range Status  09/26/2017 90 (H) 15 - 41 U/L Final   ALT  Date Value Ref Range Status  09/26/2017 86 (H) 17 - 63 U/L Final   Alkaline Phosphatase  Date Value Ref Range Status  09/26/2017 141 (H) 38 - 126 U/L Final   Total Bilirubin  Date Value Ref Range Status  09/26/2017 1.3 (H) 0.3 - 1.2 mg/dL Final   Bilirubin Total  Date Value Ref Range Status  05/05/2017 0.4 0.0 - 1.2 mg/dL Final    Hematology Recent Labs  Lab 09/29/17 0412 09/30/17 0433 10/01/17 0418  WBC 11.6* 14.3* 12.1*  RBC 3.31* 3.22* 3.11*  HGB 10.2* 10.0* 9.6*  HCT 31.3* 30.3* 30.1*  MCV 94.6 94.1 96.8  MCH 30.8 31.1 30.9  MCHC 32.6 33.0 31.9  RDW 16.2* 16.8* 16.8*  PLT 102* 118* 129*   Cardiac Enzymes Recent Labs  Lab 09/25/17 0538  TROPONINI 0.17*   No results for input(s): TROPIPOC in the last 168 hours.  BNPNo results for input(s): BNP, PROBNP in the last 168 hours.  DDimer No results for input(s): DDIMER in  the last 168 hours. TSH:  Lab Results  Component Value Date   TSH 4.34 02/28/2016   Lipids: Lab Results  Component Value Date   CHOL 97 (L) 05/05/2017   HDL 47 05/05/2017   LDLCALC 40 05/05/2017   TRIG 50 09/26/2017   CHOLHDL 2.4 05/02/2015   HgbA1c: Lab Results  Component Value Date   HGBA1C 5.7 04/23/2017   Radiology/Studies:  Dg Chest Port 1 View  Result Date: 10/01/2017 CLINICAL DATA:  Endotracheal tube placement EXAM: PORTABLE CHEST 1 VIEW COMPARISON:  09/30/2017 FINDINGS: Tracheostomy in good position. Central venous catheter tip in the SVC unchanged. Feeding tube enters the stomach with the tip not visualized. Bibasilar airspace disease left greater than right is unchanged. Small bilateral effusions also unchanged. Negative for edema. IMPRESSION: Bibasilar airspace disease and bilateral pleural effusions left greater than right unchanged from the prior study. Electronically Signed   By: Franchot Gallo M.D.   On: 10/01/2017 07:21   Dg Chest Port 1 View  Result Date: 09/30/2017 CLINICAL DATA:  Tracheostomy status.  Pleural effusions. EXAM: PORTABLE CHEST 1 VIEW 3:15 p.m. COMPARISON:  Chest x-ray dated 09/30/2017 at 4:58 a.m. and chest x-ray dated 09/29/2017 FINDINGS: Since the prior study the endotracheal tube has been removed and a tracheostomy tube has been inserted and appears in good position in the AP projection. Central catheter tip is at the cavoatrial junction.  Feeding tube tip is below the diaphragm. Bilateral pleural effusions are less prominent than on the prior study of this same date. Chronic cardiomegaly. Aortic atherosclerosis. Pulmonary vascularity appears normal. CABG. IMPRESSION: 1. Tracheostomy tube appears in good position. 2. Decreased bilateral pleural effusions. Haziness in the lung bases has resolved and may have represented mild pulmonary edema. 3.  Aortic Atherosclerosis (ICD10-I70.0). Electronically Signed   By: Lorriane Shire M.D.   On: 09/30/2017 15:41   Dg Chest Port 1 View  Result Date: 09/30/2017 CLINICAL DATA:  Hypoxia EXAM: PORTABLE CHEST 1 VIEW COMPARISON:  September 29, 2017 FINDINGS: Endotracheal tube tip is 3.5 cm above the carina. Nasogastric tube and feeding tube tips are below the diaphragm. Central catheter tip is in the superior vena cava near the cavoatrial junction. No pneumothorax. There are bilateral pleural effusions with patchy airspace consolidation in both lower lobes. Heart is mildly enlarged with pulmonary vascularity normal. Patient is status post coronary artery bypass grafting. There is aortic atherosclerosis. No evident bone lesions. IMPRESSION: Tube and catheter positions as described. Small pneumothorax seen previously on the right is no longer evident. There are bilateral pleural effusions with airspace consolidation in both lower lobes. Suspect a degree of pneumonia in the lung bases, although alveolar edema could present in this manner. Both entities may be present concurrently. Stable cardiac prominence. There is aortic atherosclerosis. Aortic Atherosclerosis (ICD10-I70.0). Electronically Signed   By: Lowella Grip III M.D.   On: 09/30/2017 07:06   Dg Chest Port 1 View  Result Date: 09/29/2017 CLINICAL DATA:  Endotracheal tube EXAM: PORTABLE CHEST 1 VIEW COMPARISON:  09/28/2017 FINDINGS: Endotracheal tube in good position. Feeding tube in place with the tip not visualized but entering the stomach. NG tube also  enters the stomach. Right jugular central venous catheter tip at the cavoatrial junction unchanged. Probable small right apical pneumothorax. Recommend follow-up for confirmation. Bibasilar atelectasis and effusion unchanged.  Negative for edema. IMPRESSION: Endotracheal tube remains in good position. Bibasilar atelectasis and effusion unchanged Probable small right apical pneumothorax. Recommend follow-up chest x-ray to confirm. Electronically Signed   By: Juanda Crumble  Carlis Abbott M.D.   On: 09/29/2017 08:47   Dg Chest Port 1 View  Result Date: 09/28/2017 CLINICAL DATA:  Respiratory failure EXAM: PORTABLE CHEST 1 VIEW COMPARISON:  Chest x-rays dated 09/27/2017, 09/26/2017 and 11/16/2016. FINDINGS: Endotracheal tube appears well positioned with tip approximately 3 cm above the carina. Enteric tube passes below the diaphragm. RIGHT IJ central line appears adequately positioned with tip at the level of the mid/lower SVC. Stable cardiomegaly. Stable bibasilar pleural effusions with probable associated atelectasis. No new lung findings. No pneumothorax seen. IMPRESSION: 1. No change compared to yesterday's chest x-ray. Persistent bibasilar pleural effusions, small to moderate in size, with probable associated atelectasis. 2. Support apparatus appears appropriately positioned. Electronically Signed   By: Franki Cabot M.D.   On: 09/28/2017 08:40    Assessment and Plan:   1.Acute on chronic combined systolic and diastolic heart failure: -Prior echocardiogram 11/18/2016 performed during an admission for chest pain showed an EF of 40% to 45% probable hypokinesis of the inferolateral myocardium, probable hypokinesis of the mid-apicalanteroseptal myocardium and G1 DD.  Cardiac cath on 11/17/2016 revealed severe three-vessel disease, severe native CAD beyond the insertions of the grafts which were not considered amenable to PCI.  Elevation in troponins at that time likely from small vessel disease.  LV gram revealed tachycardia  Subu variant pattern with akinesis of the base and normal contraction of the apex. -Patient now admitted for acute hypoxic respiratory failure in the setting of CAP versus aspiration pneumonia and sepsis versus hypovolemic shock.  She has not was intubated and on vasopressor support. -Echocardiogram performed 09/24/2017 which revealed further decrease in LVEF to 20-25%.  -Weight, 184lb day, 180lb on admission -I&O, net +7.7 L since admission, 24-hour total output 990 mL -BP variable, 122/61, 126/97, 94/51, 89/74>>off pressors since 09/28/17, sedation present  -No fluid volume overload on exam, CXR 10/01/2017 with bibasilar airspace disease and bilateral pleural effusions, left greater than right unchanged from prior study -ASA, statin, Plavix -No ACE-I/ARB given worsening renal failure -IV Lasix 55m twice daily for now and monitor renal function and BP. Will increase as tolerated.  -Given his labile BP's, this limits medical therapy at this time -Once more stable, can consider adding other agents   2. Acute hypoxic respiratory failure from aspiration PNA, pulmonary edema and pleural effusions: -[facility] being on a several day alcohol binge however, seemed to be at baseline per family (in wheelchair). He reportedly had a productive cough and on 10/07/2017 he developed respiratory distress and altered mental status while family was trying to bathe him. He was intubated in the emergency department secondary to severe hypoxia.  -He is been unable to wean from ventilator support and is now trached as of 09/30/2017 and remains on full vent support  -Waiting on SNF when medically ready for d/c  -Per PCCM  3. Acute renal failure: -Creatinine, 1.58 today, up from 1.44 yesterday -Admission creatinine, 1.84 -Remote baseline appears to be <1.0 -HCO3 added today, 10/01/2017 -Likely in the setting of acute respiratory failure, sepsis and severe shock -Per primary  4. DM2: -SSI  for glucose control, with Lantus -Per primary  5. Anemia of chronic disease: -In the setting of EtOH -Follow CBC, per primary  6.  HTN: -Labile, 122/61, 126/97, 94/51, 89/74>>off pressors since 09/28/17, sedation present  -No antihypertensive agents  7.  History of CAD s/p CABG: -No reports of chest pain -Last cardiac cath, see above -Now with decreased LV function to 20 to  25% per echocardiogram this admission -Troponin, 0.16, 0.22, 0.18, 0.27, 0.28, 0.17-flat trend likely in the setting of acute hypoxic respiratory failure, sepsis, septic versus hypovolemic shock   For questions or updates, please contact [facility] Please consult [email or web address] for contact info under Cardiology/STEMI.   SignedTuvia, Woodrick NP-[facility] Pager: [phone] 10/01/2017 1:50 PM

## 2017-10-02 ENCOUNTER — Inpatient Hospital Stay (HOSPITAL_COMMUNITY): Payer: Medicare HMO

## 2017-10-02 LAB — BASIC METABOLIC PANEL
Anion gap: 8 (ref 5–15)
BUN: 80 mg/dL — AB (ref 6–20)
CALCIUM: 7.8 mg/dL — AB (ref 8.9–10.3)
CO2: 20 mmol/L — ABNORMAL LOW (ref 22–32)
Chloride: 104 mmol/L (ref 101–111)
Creatinine, Ser: 1.55 mg/dL — ABNORMAL HIGH (ref 0.61–1.24)
GFR calc Af Amer: 47 mL/min — ABNORMAL LOW (ref >60)
GFR, EST NON AFRICAN AMERICAN: 41 mL/min — AB (ref >60)
Glucose, Bld: 197 mg/dL — ABNORMAL HIGH (ref 65–99)
POTASSIUM: 3.8 mmol/L (ref 3.5–5.1)
Sodium: 132 mmol/L — ABNORMAL LOW (ref 135–145)

## 2017-10-02 LAB — CBC
HEMATOCRIT: 27.6 % — AB (ref 39.0–52.0)
Hemoglobin: 9.2 g/dL — ABNORMAL LOW (ref 13.0–17.0)
MCH: 30.7 pg (ref 26.0–34.0)
MCHC: 33.3 g/dL (ref 30.0–36.0)
MCV: 92 fL (ref 78.0–100.0)
Platelets: 154 10*3/uL (ref 150–400)
RBC: 3 MIL/uL — ABNORMAL LOW (ref 4.22–5.81)
RDW: 16.6 % — AB (ref 11.5–15.5)
WBC: 13 10*3/uL — AB (ref 4.0–10.5)

## 2017-10-02 LAB — GLUCOSE, CAPILLARY
GLUCOSE-CAPILLARY: 162 mg/dL — AB (ref 65–99)
GLUCOSE-CAPILLARY: 168 mg/dL — AB (ref 65–99)
GLUCOSE-CAPILLARY: 180 mg/dL — AB (ref 65–99)
Glucose-Capillary: 206 mg/dL — ABNORMAL HIGH (ref 65–99)
Glucose-Capillary: 228 mg/dL — ABNORMAL HIGH (ref 65–99)
Glucose-Capillary: 185 mg/dL — ABNORMAL HIGH (ref 65–99)

## 2017-10-02 MED ORDER — FUROSEMIDE 10 MG/ML IJ SOLN
10.0000 mg/h | INTRAVENOUS | Status: DC
  Administered 2017-10-02 – 2017-10-03 (×2): 8 mg/h via INTRAVENOUS
  Administered 2017-10-04: 10 mg/h via INTRAVENOUS
  Filled 2017-10-02: qty 25
  Filled 2017-10-02: qty 21
  Filled 2017-10-02: qty 25
  Filled 2017-10-02 (×2): qty 20

## 2017-10-02 NOTE — Progress Notes (Signed)
DAILY PROGRESS NOTE   Patient Name: Johnny [name] Date of Encounter: 10/02/2017  Chief Complaint   Intubated, sedated on vent  Patient Profile   BARY LIMBACH is a 83 y.o. male with a hx of systolic CHF (ICM, LVEF 54% ), HTN, HLD, DM 2, prior EtOH abuse quit in 10/2015, CVA, CAD s/p CABG, PAF/aflutter s/p ablation 2009 and PEA arrest in 04/2012 in the setting of withdrawal seizure significant for  who is being seen today for the evaluation of worsening systolic heart failure at the request of Dr. Halford Chessman.  Subjective   No issues overnight. Requiring full vent support. No net diuresis yesterday on account of >In's than out's. Weight is up 1 lb.   Objective   Vitals:   10/02/17 0800 10/02/17 0900 10/02/17 1007 10/02/17 1100  BP: (!) 136/104 116/61 99/62 (!) 114/59  Pulse: 86 65 79 73  Resp: (!) 26 (!) 26 (!) 28 (!) 26  Temp:      TempSrc:      SpO2: 100% 100% 100% 100%  Weight:      Height:        Intake/Output Summary (Last 24 hours) at 10/02/2017 1130 Last data filed at 10/02/2017 1100 Gross per 24 hour  Intake 2236.63 ml  Output 1850 ml  Net 386.63 ml   Filed Weights   09/30/17 0456 10/01/17 0500 10/02/17 0416  Weight: 184 lb 4.9 oz (83.6 kg) 184 lb 15.5 oz (83.9 kg) 185 lb 6.5 oz (84.1 kg)    Physical Exam   General appearance: alert, no distress and trached and on vent Neck: JVD - several cm above sternal notch, no carotid bruit and thyroid not enlarged, symmetric, no tenderness/mass/nodules Lungs: diminished breath sounds bilaterally Heart: regular rate and rhythm Abdomen: soft, non-tender; bowel sounds normal; no masses,  no organomegaly Extremities: edema 1+ edema Pulses: 2+ and symmetric Skin: Skin color, texture, turgor normal. No rashes or lesions Neurologic: Mental status: lightly sedated on vent Psych: cannot assess  Inpatient Medications    Scheduled Meds: . aspirin  81 mg Per Tube Daily  . atorvastatin  20 mg Per Tube Daily  . chlorhexidine gluconate  (MEDLINE KIT)  15 mL Mouth Rinse BID  . clopidogrel  75 mg Per Tube Daily  . famotidine  20 mg Per Tube Daily  . folic acid  1 mg Per Tube Daily  . furosemide  40 mg Intravenous BID  . heparin  5,000 Units Subcutaneous Q8H  . insulin aspart  0-15 Units Subcutaneous Q4H  . insulin glargine  5 Units Subcutaneous QHS  . mouth rinse  15 mL Mouth Rinse 10 times per day  . multivitamin with minerals  1 tablet Per Tube Daily  . thiamine  100 mg Per Tube Daily    Continuous Infusions: . dexmedetomidine (PRECEDEX) IV infusion 1.001 mcg/kg/hr (10/02/17 0700)  . feeding supplement (VITAL AF 1.2 CAL) 70 mL/hr at 10/02/17 0700    PRN Meds: docusate, fentaNYL (SUBLIMAZE) injection   Labs   Results for orders placed or performed during the hospital encounter of 09/25/2017 (from the past 48 hour(s))  Glucose, capillary     Status: Abnormal   Collection Time: 09/30/17 11:31 AM  Result Value Ref Range   Glucose-Capillary 109 (H) 65 - 99 mg/dL  Glucose, capillary     Status: None   Collection Time: 09/30/17  3:27 PM  Result Value Ref Range   Glucose-Capillary 95 65 - 99 mg/dL  Glucose, capillary  Status: Abnormal   Collection Time: 09/30/17  8:13 PM  Result Value Ref Range   Glucose-Capillary 163 (H) 65 - 99 mg/dL  Glucose, capillary     Status: Abnormal   Collection Time: 10/01/17 12:34 AM  Result Value Ref Range   Glucose-Capillary 204 (H) 65 - 99 mg/dL  Glucose, capillary     Status: Abnormal   Collection Time: 10/01/17  3:57 AM  Result Value Ref Range   Glucose-Capillary 206 (H) 65 - 99 mg/dL  CBC     Status: Abnormal   Collection Time: 10/01/17  4:18 AM  Result Value Ref Range   WBC 12.1 (H) 4.0 - 10.5 K/uL   RBC 3.11 (L) 4.22 - 5.81 MIL/uL   Hemoglobin 9.6 (L) 13.0 - 17.0 g/dL   HCT 30.1 (L) 39.0 - 52.0 %   MCV 96.8 78.0 - 100.0 fL   MCH 30.9 26.0 - 34.0 pg   MCHC 31.9 30.0 - 36.0 g/dL   RDW 16.8 (H) 11.5 - 15.5 %   Platelets 129 (L) 150 - 400 K/uL    Comment: Performed at  [facility], [place]. [address] [place], [place] [address]  Basic metabolic panel     Status: Abnormal   Collection Time: 10/01/17  4:18 AM  Result Value Ref Range   Sodium 133 (L) 135 - 145 mmol/L   Potassium 3.9 3.5 - 5.1 mmol/L   Chloride 106 101 - 111 mmol/L   CO2 18 (L) 22 - 32 mmol/L   Glucose, Bld 206 (H) 65 - 99 mg/dL   BUN 75 (H) 6 - 20 mg/dL   Creatinine, Ser 1.58 (H) 0.61 - 1.24 mg/dL   Calcium 7.7 (L) 8.9 - 10.3 mg/dL   GFR calc non Af Amer 40 (L) >60 mL/min   GFR calc Af Amer 46 (L) >60 mL/min    Comment: (NOTE) The eGFR has been calculated using the CKD EPI equation. This calculation has not been validated in all clinical situations. eGFR's persistently <60 mL/min signify possible Chronic Kidney Disease.    Anion gap 9 5 - 15    Comment: Performed at [facility] [address] [place], [place] [address]  Magnesium     Status: None   Collection Time: 10/01/17  4:18 AM  Result Value Ref Range   Magnesium 2.0 1.7 - 2.4 mg/dL    Comment: Performed at [facility] [address], [place], [place] [address]  Phosphorus     Status: None   Collection Time: 10/01/17  4:18 AM  Result Value Ref Range   Phosphorus 4.0 2.5 - 4.6 mg/dL    Comment: Performed at [facility] [address], [place], [place] [address]  I-STAT 3, venous blood gas (G3P V)     Status: Abnormal   Collection Time: 10/01/17  4:36 AM  Result Value Ref Range   pH, Ven 7.266 7.250 - 7.430   pCO2, Ven 36.4 (L) 44.0 - 60.0 mmHg   pO2, Ven 24.0 (LL) 32.0 - 45.0 mmHg   Bicarbonate 16.7 (L) 20.0 - 28.0 mmol/L   TCO2 18 (L) 22 - 32 mmol/L   O2 Saturation 36.0 %   Acid-base deficit 10.0 (H) 0.0 - 2.0 mmol/L   Patient temperature 36.6 C    Collection site RADIAL, ALLEN'S TEST ACCEPTABLE    Drawn by RT    Sample type VENOUS    Comment NOTIFIED PHYSICIAN   Glucose, capillary  Status: Abnormal   Collection Time: 10/01/17  7:55 AM  Result Value Ref Range   Glucose-Capillary 195  (H) 65 - 99 mg/dL  .Cooxemetry Panel (carboxy, met, total hgb, O2 sat)     Status: Abnormal   Collection Time: 10/01/17  9:45 AM  Result Value Ref Range   Total hemoglobin 9.2 (L) 12.0 - 16.0 g/dL   O2 Saturation 60.4 %   Carboxyhemoglobin 0.9 0.5 - 1.5 %   Methemoglobin 1.6 (H) 0.0 - 1.5 %  Glucose, capillary     Status: Abnormal   Collection Time: 10/01/17 11:54 AM  Result Value Ref Range   Glucose-Capillary 122 (H) 65 - 99 mg/dL  Glucose, capillary     Status: Abnormal   Collection Time: 10/01/17  3:29 PM  Result Value Ref Range   Glucose-Capillary 157 (H) 65 - 99 mg/dL  Glucose, capillary     Status: Abnormal   Collection Time: 10/01/17  8:06 PM  Result Value Ref Range   Glucose-Capillary 199 (H) 65 - 99 mg/dL  Glucose, capillary     Status: Abnormal   Collection Time: 10/02/17 12:13 AM  Result Value Ref Range   Glucose-Capillary 180 (H) 65 - 99 mg/dL  Basic metabolic panel     Status: Abnormal   Collection Time: 10/02/17  4:14 AM  Result Value Ref Range   Sodium 132 (L) 135 - 145 mmol/L   Potassium 3.8 3.5 - 5.1 mmol/L   Chloride 104 101 - 111 mmol/L   CO2 20 (L) 22 - 32 mmol/L   Glucose, Bld 197 (H) 65 - 99 mg/dL   BUN 80 (H) 6 - 20 mg/dL   Creatinine, Ser 1.55 (H) 0.61 - 1.24 mg/dL   Calcium 7.8 (L) 8.9 - 10.3 mg/dL   GFR calc non Af Amer 41 (L) >60 mL/min   GFR calc Af Amer 47 (L) >60 mL/min    Comment: (NOTE) The eGFR has been calculated using the CKD EPI equation. This calculation has not been validated in all clinical situations. eGFR's persistently <60 mL/min signify possible Chronic Kidney Disease.    Anion gap 8 5 - 15    Comment: Performed at [facility] [address] [place], [place] [address]  CBC     Status: Abnormal   Collection Time: 10/02/17  4:14 AM  Result Value Ref Range   WBC 13.0 (H) 4.0 - 10.5 K/uL   RBC 3.00 (L) 4.22 - 5.81 MIL/uL   Hemoglobin 9.2 (L) 13.0 - 17.0 g/dL   HCT 27.6 (L) 39.0 - 52.0 %   MCV 92.0 78.0 - 100.0 fL    MCH 30.7 26.0 - 34.0 pg   MCHC 33.3 30.0 - 36.0 g/dL   RDW 16.6 (H) 11.5 - 15.5 %   Platelets 154 150 - 400 K/uL    Comment: Performed at [facility], [place] [address] [place], [place] [address]  Glucose, capillary     Status: Abnormal   Collection Time: 10/02/17  4:17 AM  Result Value Ref Range   Glucose-Capillary 168 (H) 65 - 99 mg/dL  Glucose, capillary     Status: Abnormal   Collection Time: 10/02/17  7:59 AM  Result Value Ref Range   Glucose-Capillary 162 (H) 65 - 99 mg/dL    ECG   N/A  Telemetry   Sinus rhythm in 70's - Personally Reviewed  Radiology    Dg Chest Port 1 View  Result Date: 10/02/2017 CLINICAL DATA:  Respiratory failure. EXAM:  PORTABLE CHEST 1 VIEW COMPARISON:  10/01/2017.  09/30/2017. FINDINGS: Tracheostomy tube, feeding tube, right IJ line in stable position. Prior CABG. Cardiomegaly with diffuse bilateral pulmonary infiltrates/edema and bilateral pleural effusions. Findings most consistent with CHF. Vascular calcification noted. IMPRESSION: 1.  Lines and tubes in stable position. 2. Prior CABG. Cardiomegaly with diffuse bilateral pulmonary infiltrates/edema bilateral pleural effusions. Findings most consistent with CHF. Electronically Signed   By: Marcello Moores  Register   On: 10/02/2017 06:22   Dg Chest Port 1 View  Result Date: 10/01/2017 CLINICAL DATA:  Endotracheal tube placement EXAM: PORTABLE CHEST 1 VIEW COMPARISON:  09/30/2017 FINDINGS: Tracheostomy in good position. Central venous catheter tip in the SVC unchanged. Feeding tube enters the stomach with the tip not visualized. Bibasilar airspace disease left greater than right is unchanged. Small bilateral effusions also unchanged. Negative for edema. IMPRESSION: Bibasilar airspace disease and bilateral pleural effusions left greater than right unchanged from the prior study. Electronically Signed   By: Franchot Gallo M.D.   On: 10/01/2017 07:21   Dg Chest Port 1 View  Result Date: 09/30/2017 CLINICAL  DATA:  Tracheostomy status.  Pleural effusions. EXAM: PORTABLE CHEST 1 VIEW 3:15 p.m. COMPARISON:  Chest x-ray dated 09/30/2017 at 4:58 a.m. and chest x-ray dated 09/29/2017 FINDINGS: Since the prior study the endotracheal tube has been removed and a tracheostomy tube has been inserted and appears in good position in the AP projection. Central catheter tip is at the cavoatrial junction. Feeding tube tip is below the diaphragm. Bilateral pleural effusions are less prominent than on the prior study of this same date. Chronic cardiomegaly. Aortic atherosclerosis. Pulmonary vascularity appears normal. CABG. IMPRESSION: 1. Tracheostomy tube appears in good position. 2. Decreased bilateral pleural effusions. Haziness in the lung bases has resolved and may have represented mild pulmonary edema. 3.  Aortic Atherosclerosis (ICD10-I70.0). Electronically Signed   By: Lorriane Shire M.D.   On: 09/30/2017 15:41    Cardiac Studies   LV EF: 20% -   25%  ------------------------------------------------------------------- Indications:      Shock unspecified 785.50.  ------------------------------------------------------------------- History:   PMH:  Hypotension.  ------------------------------------------------------------------- Study Conclusions  - Left ventricle: The cavity size was severely dilated. Systolic   function was severely reduced. The estimated ejection fraction   was in the range of 20% to 25%. Diffuse hypokinesis. The study is   not technically sufficient to allow evaluation of LV diastolic   function. - Mitral valve: Severely calcified annulus. Moderately thickened   leaflets . There was mild regurgitation. - Left atrium: The atrium was mildly dilated.  Assessment   1. Active Problems: 2.   Acute hypoxemic respiratory failure (HCC) 3.   Sepsis ([place]) 4.   Septic shock ([place]) 5.   AKI (acute kidney injury) ([place]) 6.   Acute on chronic combined systolic and diastolic CHF (congestive  heart failure) ([place]) 7.   Demand ischemia ([place]) 8.   Plan   1. Remains volume overloaded - despite diuresis with significant in's. Recommend starting lasix gtts today - bolus 80 mg x 1, then infuse at 8 mg/hr. Monitor urine output and adjust accordingly. Reduce fluid intake if possible. Continue medical therapy for CHF.  Time Spent Directly with Patient:  I have spent a total of 25 minutes with the patient reviewing hospital notes, telemetry, EKGs, labs and examining the patient as well as establishing an assessment and plan that was discussed personally with the patient. > 50% of time was spent in direct patient care.  Length of Stay:  LOS: 9 days   Pixie Casino, MD, [facility], [place] Director of the Advanced Lipid Disorders &  Cardiovascular Risk Reduction Clinic Diplomate of the American Board of Clinical Lipidology Attending Cardiologist  Direct Dial: [phone]  Fax: [phone]  Website:  [email or web address] Osgood Neha Waight 10/02/2017, 11:30 AM

## 2017-10-02 NOTE — Progress Notes (Signed)
PULMONARY / CRITICAL CARE MEDICINE   Name: Johnny [name] MRN: [id number] DOB: 1934/10/07    ADMISSION DATE:  10/05/2017  REFERRING MD:  Dr. Suzi Roots EDP  CHIEF COMPLAINT:  Shock  HISTORY OF PRESENT ILLNESS:   83 yo male former smoker with respiratory failure and altered mental status after alcohol binge.  Found to have pneumonia, septic shock and intubated in ER. PMHx ETOH, Withdrawal seizures, CAD s/p CABG, HFrEF, PAF, DM, CVA, HLD, HTN.  SUBJECTIVE:   Remains on full vent support.  VITAL SIGNS: BP (!) 136/104   Pulse 86   Temp 98.3 F (36.8 C) (Oral)   Resp (!) 26   Ht 5' 10.5" (1.791 m)   Wt 185 lb 6.5 oz (84.1 kg)   SpO2 100%   BMI 26.23 kg/m   HEMODYNAMICS: CVP:  [8 mmHg-24 mmHg] 14 mmHg  VENTILATOR SETTINGS: Vent Mode: PRVC FiO2 (%):  [40 %-60 %] 40 % Set Rate:  [14 bmp-26 bmp] 26 bmp Vt Set:  [590 mL] 590 mL PEEP:  [8 cmH20-12 cmH20] 8 cmH20 Plateau Pressure:  [24 cmH20-28 cmH20] 24 cmH20  INTAKE / OUTPUT: I/O last 3 completed shifts: In: 3597.2 [I.V.:807.2; NG/GT:2790] Out: 2370 [Urine:2370]  PHYSICAL EXAMINATION:   General - pleasant Eyes - pupils reactive ENT - trach site clean Cardiac - irregular, no murmur Chest - decreased BS at bases Abd - soft, non tender Ext - 1+ edema Skin - no rashes Neuro - opens eyes with stimulation  LABS:  BMET Recent Labs  Lab 09/30/17 0433 10/01/17 0418 10/02/17 0414  NA 135 133* 132*  K 4.5 3.9 3.8  CL 104 106 104  CO2 19* 18* 20*  BUN 60* 75* 80*  CREATININE 1.44* 1.58* 1.55*  GLUCOSE 96 206* 197*   Electrolytes Recent Labs  Lab 09/29/17 2016 09/30/17 0433 10/01/17 0418 10/02/17 0414  CALCIUM 8.0* 8.2* 7.7* 7.8*  MG 1.8 1.9 2.0  --   PHOS 4.2 3.7 4.0  --    CBC Recent Labs  Lab 09/30/17 0433 10/01/17 0418 10/02/17 0414  WBC 14.3* 12.1* 13.0*  HGB 10.0* 9.6* 9.2*  HCT 30.3* 30.1* 27.6*  PLT 118* 129* 154   Coag's No results for input(s): APTT, INR in the last 168 hours.  Sepsis  Markers Recent Labs  Lab 09/25/17 1808 09/26/17 0521 09/27/17 0331  PROCALCITON 0.57 0.45 0.35   ABG No results for input(s): PHART, PCO2ART, PO2ART in the last 168 hours.   Liver Enzymes Recent Labs  Lab 09/26/17 0521  AST 90*  ALT 86*  ALKPHOS 141*  BILITOT 1.3*  ALBUMIN 2.6*   Cardiac Enzymes No results for input(s): TROPONINI, PROBNP in the last 168 hours.   Glucose Recent Labs  Lab 10/01/17 1154 10/01/17 1529 10/01/17 2006 10/02/17 0013 10/02/17 0417 10/02/17 0759  GLUCAP 122* 157* 199* 180* 168* 162*   Imaging Dg Chest Port 1 View  Result Date: 10/02/2017 CLINICAL DATA:  Respiratory failure. EXAM: PORTABLE CHEST 1 VIEW COMPARISON:  10/01/2017.  09/30/2017. FINDINGS: Tracheostomy tube, feeding tube, right IJ line in stable position. Prior CABG. Cardiomegaly with diffuse bilateral pulmonary infiltrates/edema and bilateral pleural effusions. Findings most consistent with CHF. Vascular calcification noted. IMPRESSION: 1.  Lines and tubes in stable position. 2. Prior CABG. Cardiomegaly with diffuse bilateral pulmonary infiltrates/edema bilateral pleural effusions. Findings most consistent with CHF. Electronically Signed   By: Maisie Fus  Register   On: 10/02/2017 06:22    STUDIES:  CT head 6/05 >> atrophy, chronic small vessel ischemia Echo  6/06 >> EF 20 to 25%  CULTURES: Blood 6/05 >> negative  ANTIBIOTICS: Cefepime 6/5 > 6/10 Vancomycin 6/5 > 6/12  SIGNIFICANT EVENTS: 6/05 admit 6/13 cardiology consulted  LINES/TUBES: ETT 6/5>>>6/12 Rt IJ CVL 6/5>>> Trach 6/12>>>  DISCUSSION: 83 yo with aspiration pneumonia, acute hypoxic respiratory failure, acute on chronic systolic CHF with HFrEF in setting of alcoholic binge.  Failed to wean and required trach.  ASSESSMENT / PLAN:  Acute hypoxic respiratory failure from aspiration pneumonia, pulmonary edema, and pleural effusions. Failure to wean s/p trach. - pressure support wean as able - wean PEEP to keep  SpO2 > 92% - f/u CXR  Acute on chronic systolic CHF with HFrEF. CAD s/p CABG, A fib/flutter. - cardiology consulted 6/13 - continue ASA, plavix, lipitor - continue lasix  Acute renal failure from ATN. Non gap acidosis >> improved 6/14. - monitor renal fx - complete HCO3 6/14 - f/u BMET  Anemia of critical illness and chronic disease. Thrombocytopenia in setting of ETOH. - f/u CBC  DM type II. - SSI with lantus  Acute metabolic encephalopathy. Hx of ETOH. - RASS goal 0 to -1 - thiamine, folic acid, MVI  DVT - SQ heparin SUP - pepcid Nutrition - tube feeds Goals of care - tube feeds  CC time 32 minutes  Coralyn HellingVineet Zakaria Fromer, MD [facility] 10/02/2017, 9:17 AM

## 2017-10-03 ENCOUNTER — Inpatient Hospital Stay (HOSPITAL_COMMUNITY): Payer: Medicare HMO

## 2017-10-03 LAB — BASIC METABOLIC PANEL
ANION GAP: 10 (ref 5–15)
BUN: 91 mg/dL — ABNORMAL HIGH (ref 6–20)
CHLORIDE: 102 mmol/L (ref 101–111)
CO2: 21 mmol/L — AB (ref 22–32)
Calcium: 8.1 mg/dL — ABNORMAL LOW (ref 8.9–10.3)
Creatinine, Ser: 1.63 mg/dL — ABNORMAL HIGH (ref 0.61–1.24)
GFR calc Af Amer: 44 mL/min — ABNORMAL LOW (ref >60)
GFR, EST NON AFRICAN AMERICAN: 38 mL/min — AB (ref >60)
GLUCOSE: 213 mg/dL — AB (ref 65–99)
POTASSIUM: 3.8 mmol/L (ref 3.5–5.1)
Sodium: 133 mmol/L — ABNORMAL LOW (ref 135–145)

## 2017-10-03 LAB — GLUCOSE, CAPILLARY
GLUCOSE-CAPILLARY: 187 mg/dL — AB (ref 65–99)
GLUCOSE-CAPILLARY: 213 mg/dL — AB (ref 65–99)
GLUCOSE-CAPILLARY: 232 mg/dL — AB (ref 65–99)
Glucose-Capillary: 170 mg/dL — ABNORMAL HIGH (ref 65–99)
Glucose-Capillary: 187 mg/dL — ABNORMAL HIGH (ref 65–99)
Glucose-Capillary: 190 mg/dL — ABNORMAL HIGH (ref 65–99)
Glucose-Capillary: 194 mg/dL — ABNORMAL HIGH (ref 65–99)

## 2017-10-03 LAB — MAGNESIUM: Magnesium: 1.7 mg/dL (ref 1.7–2.4)

## 2017-10-03 LAB — CBC
HEMATOCRIT: 26.7 % — AB (ref 39.0–52.0)
HEMOGLOBIN: 8.9 g/dL — AB (ref 13.0–17.0)
MCH: 30.9 pg (ref 26.0–34.0)
MCHC: 33.3 g/dL (ref 30.0–36.0)
MCV: 92.7 fL (ref 78.0–100.0)
Platelets: 163 10*3/uL (ref 150–400)
RBC: 2.88 MIL/uL — AB (ref 4.22–5.81)
RDW: 17 % — ABNORMAL HIGH (ref 11.5–15.5)
WBC: 11 10*3/uL — ABNORMAL HIGH (ref 4.0–10.5)

## 2017-10-03 MED ORDER — ONDANSETRON HCL 4 MG/2ML IJ SOLN
4.0000 mg | Freq: Four times a day (QID) | INTRAMUSCULAR | Status: DC | PRN
  Administered 2017-10-03 – 2017-10-06 (×8): 4 mg via INTRAVENOUS
  Filled 2017-10-03 (×9): qty 2

## 2017-10-03 NOTE — Progress Notes (Signed)
 Progress Note  Patient Name: Johnny [name] Date of Encounter: 10/03/2017  Primary Cardiologist: Peter Jordan, MD   Subjective   No events overnight  Inpatient Medications    Scheduled Meds: . aspirin  81 mg Per Tube Daily  . atorvastatin  20 mg Per Tube Daily  . chlorhexidine gluconate (MEDLINE KIT)  15 mL Mouth Rinse BID  . clopidogrel  75 mg Per Tube Daily  . famotidine  20 mg Per Tube Daily  . folic acid  1 mg Per Tube Daily  . heparin  5,000 Units Subcutaneous Q8H  . insulin aspart  0-15 Units Subcutaneous Q4H  . insulin glargine  5 Units Subcutaneous QHS  . mouth rinse  15 mL Mouth Rinse 10 times per day  . multivitamin with minerals  1 tablet Per Tube Daily  . thiamine  100 mg Per Tube Daily   Continuous Infusions: . dexmedetomidine (PRECEDEX) IV infusion 1 mcg/kg/hr (10/03/17 1223)  . feeding supplement (VITAL AF 1.2 CAL) 1,000 mL (10/03/17 0555)  . furosemide (LASIX) infusion 8 mg/hr (10/03/17 0918)   PRN Meds: docusate, fentaNYL (SUBLIMAZE) injection   Vital Signs    Vitals:   10/03/17 1144 10/03/17 1153 10/03/17 1200 10/03/17 1300  BP:   94/73 120/68  Pulse:  85 92 (!) 101  Resp:  (!) 24 (!) 31 (!) 25  Temp: 98.1 F (36.7 C)     TempSrc: Oral     SpO2:  100% 100% 100%  Weight:      Height:        Intake/Output Summary (Last 24 hours) at 10/03/2017 1326 Last data filed at 10/03/2017 1200 Gross per 24 hour  Intake 2310.3 ml  Output 2825 ml  Net -514.7 ml   Filed Weights   10/01/17 0500 10/02/17 0416 10/03/17 0554  Weight: 184 lb 15.5 oz (83.9 kg) 185 lb 6.5 oz (84.1 kg) 192 lb 14.4 oz (87.5 kg)    Telemetry    SR - Personally Reviewed  ECG    na  Physical Exam   GEN: No acute distress.   Neck: No JVD Cardiac: RRR, no murmurs, rubs, or gallops.  Respiratory: coarse bilaterally GI: Soft, nontender, non-distended  MS: No edema; Neuro:  Nonfocal  Psych: Normal affect   Labs    Chemistry Recent Labs  Lab 10/01/17 0418  10/02/17 0414 10/03/17 0615  NA 133* 132* 133*  K 3.9 3.8 3.8  CL 106 104 102  CO2 18* 20* 21*  GLUCOSE 206* 197* 213*  BUN 75* 80* 91*  CREATININE 1.58* 1.55* 1.63*  CALCIUM 7.7* 7.8* 8.1*  GFRNONAA 40* 41* 38*  GFRAA 46* 47* 44*  ANIONGAP 9 8 10     Hematology Recent Labs  Lab 10/01/17 0418 10/02/17 0414 10/03/17 0615  WBC 12.1* 13.0* 11.0*  RBC 3.11* 3.00* 2.88*  HGB 9.6* 9.2* 8.9*  HCT 30.1* 27.6* 26.7*  MCV 96.8 92.0 92.7  MCH 30.9 30.7 30.9  MCHC 31.9 33.3 33.3  RDW 16.8* 16.6* 17.0*  PLT 129* 154 163    Cardiac EnzymesNo results for input(s): TROPONINI in the last 168 hours. No results for input(s): TROPIPOC in the last 168 hours.   BNPNo results for input(s): BNP, PROBNP in the last 168 hours.   DDimer No results for input(s): DDIMER in the last 168 hours.   Radiology    Dg Chest Port 1 View  Result Date: 10/03/2017 CLINICAL DATA:  Respiratory failure EXAM: PORTABLE CHEST 1 VIEW COMPARISON:  10/02/2017 FINDINGS:   Support devices are stable. Layering bilateral effusions with bilateral perihilar and lower lobe airspace opacities, similar prior study. Mild cardiomegaly. Prior CABG. IMPRESSION: Layering bilateral effusions with bilateral lower lobe atelectasis or infiltrates. Cardiomegaly. No significant change. Electronically Signed   By: Kevin  Dover M.D.   On: 10/03/2017 07:17   Dg Chest Port 1 View  Result Date: 10/02/2017 CLINICAL DATA:  Respiratory failure. EXAM: PORTABLE CHEST 1 VIEW COMPARISON:  10/01/2017.  09/30/2017. FINDINGS: Tracheostomy tube, feeding tube, right IJ line in stable position. Prior CABG. Cardiomegaly with diffuse bilateral pulmonary infiltrates/edema and bilateral pleural effusions. Findings most consistent with CHF. Vascular calcification noted. IMPRESSION: 1.  Lines and tubes in stable position. 2. Prior CABG. Cardiomegaly with diffuse bilateral pulmonary infiltrates/edema bilateral pleural effusions. Findings most consistent with CHF.  Electronically Signed   By: Thomas  Register   On: 10/02/2017 06:22    Cardiac Studies     Patient Profile     Jerremy E Reasonis a 83 y.o.malewith a hx of systolic CHF(ICM, LVEF 40%),HTN, HLD, DM 2, prior EtOH abuse quit in 10/2015, CVA, CADs/pCABG, PAF/aflutters/pablation 2009 and PEA arrest in 04/2012 in the setting of withdrawal seizuresignificant forwho is being seen today for the evaluation ofworsening systolic heart failureat the request ofDr. Sood.    Assessment & Plan    1. Acute on chronic systolic HF - 09/2017 echo LVEF 20-25%,  acute drop from echo 10/2016 LVEF 40-45% in setting of septic shock and respiraotry faiulre thought to be from aspiration pneumonia - CXR today with layering effusions, cardiomegaly - started on lasix drip yesterday, negative just 350mL. He made 2.8 liters of urine but had in 2.3 L. Increase lasix drip to 10mg/hr - recent central line venous sat 60%  - possible etiologies for LVEF drop include stress induced CM, ICM, EtoH abuse - would not consider ischemic eval at this time, reconsider pending medical progress.  - hold on beta blocker/ACE-I given rcent shock, if bps stable may start low dose.     For questions or updates, please contact [facility] Please consult [email or web address] for contact info under Cardiology/STEMI.      Signed, , , MD  10/03/2017, 1:26 PM    

## 2017-10-03 NOTE — Progress Notes (Signed)
Dr. Craige CottaSood attempted SBT on patient, CPAP/PS 15/5. Patient tolerated for only a few minutes before becoming very agitated and being switched back to full support. RT will continue to monitor.

## 2017-10-03 NOTE — Progress Notes (Signed)
PULMONARY / CRITICAL CARE MEDICINE   Name: Johnny [name] MRN: [id number] DOB: 1934/11/11    ADMISSION DATE:  09/22/2017  REFERRING MD:  Dr. Suzi RootsMilled EDP  CHIEF COMPLAINT:  Shock  HISTORY OF PRESENT ILLNESS:   83 yo male former smoker with respiratory failure and altered mental status after alcohol binge.  Found to have pneumonia, septic shock and intubated in ER. PMHx ETOH, Withdrawal seizures, CAD s/p CABG, HFrEF, PAF, DM, CVA, HLD, HTN.  SUBJECTIVE:   Only able to tolerate pressure support 15/5.  VITAL SIGNS: BP (!) 123/48 (BP Location: Left Arm)   Pulse 83   Temp 99.3 F (37.4 C) (Oral)   Resp (!) 27   Ht 5' 10.5" (1.791 m)   Wt 192 lb 14.4 oz (87.5 kg)   SpO2 98%   BMI 27.29 kg/m   HEMODYNAMICS: CVP:  [8 mmHg-20 mmHg] 17 mmHg  VENTILATOR SETTINGS: Vent Mode: PRVC FiO2 (%):  [40 %] 40 % Set Rate:  [26 bmp] 26 bmp Vt Set:  [590 mL] 590 mL PEEP:  [8 cmH20] 8 cmH20 Plateau Pressure:  [24 cmH20-26 cmH20] 26 cmH20  INTAKE / OUTPUT: I/O last 3 completed shifts: In: 3554 [I.V.:754; Other:20; NG/GT:2780] Out: 4025 [Urine:3925; Stool:100]  PHYSICAL EXAMINATION:   General - on vent Eyes - pupils reactive ENT - trach site clean Cardiac - regular, no murmur Chest - decreased BS at bases Abd - soft, non tender Ext - 1+ edema Skin - no rashes Neuro - moves extremities   LABS:  BMET Recent Labs  Lab 10/01/17 0418 10/02/17 0414 10/03/17 0615  NA 133* 132* 133*  K 3.9 3.8 3.8  CL 106 104 102  CO2 18* 20* 21*  BUN 75* 80* 91*  CREATININE 1.58* 1.55* 1.63*  GLUCOSE 206* 197* 213*   Electrolytes Recent Labs  Lab 09/29/17 2016 09/30/17 0433 10/01/17 0418 10/02/17 0414 10/03/17 0615  CALCIUM 8.0* 8.2* 7.7* 7.8* 8.1*  MG 1.8 1.9 2.0  --  1.7  PHOS 4.2 3.7 4.0  --   --    CBC Recent Labs  Lab 10/01/17 0418 10/02/17 0414 10/03/17 0615  WBC 12.1* 13.0* 11.0*  HGB 9.6* 9.2* 8.9*  HCT 30.1* 27.6* 26.7*  PLT 129* 154 163   Coag's No results for  input(s): APTT, INR in the last 168 hours.  Sepsis Markers Recent Labs  Lab 09/27/17 0331  PROCALCITON 0.35   ABG No results for input(s): PHART, PCO2ART, PO2ART in the last 168 hours.   Liver Enzymes No results for input(s): AST, ALT, ALKPHOS, BILITOT, ALBUMIN in the last 168 hours.   Cardiac Enzymes No results for input(s): TROPONINI, PROBNP in the last 168 hours.   Glucose Recent Labs  Lab 10/02/17 1155 10/02/17 1558 10/02/17 2003 10/03/17 0021 10/03/17 0354 10/03/17 0755  GLUCAP 206* 228* 185* 187* 213* 190*   Imaging Dg Chest Port 1 View  Result Date: 10/03/2017 CLINICAL DATA:  Respiratory failure EXAM: PORTABLE CHEST 1 VIEW COMPARISON:  10/02/2017 FINDINGS: Support devices are stable. Layering bilateral effusions with bilateral perihilar and lower lobe airspace opacities, similar prior study. Mild cardiomegaly. Prior CABG. IMPRESSION: Layering bilateral effusions with bilateral lower lobe atelectasis or infiltrates. Cardiomegaly. No significant change. Electronically Signed   By: Charlett NoseKevin  Dover M.D.   On: 10/03/2017 07:17    STUDIES:  CT head 6/05 >> atrophy, chronic small vessel ischemia Echo 6/06 >> EF 20 to 25%  CULTURES: Blood 6/05 >> negative  ANTIBIOTICS: Cefepime 6/5 > 6/10 Vancomycin 6/5 >  6/12  SIGNIFICANT EVENTS: 6/05 admit 6/13 cardiology consulted 6/14 start lasix gtt  LINES/TUBES: ETT 6/5>>>6/12 Rt IJ CVL 6/5>>> Trach 6/12>>>  DISCUSSION: 83 yo with aspiration pneumonia, acute hypoxic respiratory failure, acute on chronic systolic CHF with HFrEF in setting of alcoholic binge.  Failed to wean and required trach.  ASSESSMENT / PLAN:  Acute hypoxic respiratory failure from aspiration pneumonia, pulmonary edema, and pleural effusions. Failure to wean s/p trach. - change to pressure control as rest mode 6/15 - pressure support wean as able - f/u CXR - if pleural effusions don't clear with diuresis, then might need to consider  thoracentesis  Acute on chronic systolic CHF with HFrEF. CAD s/p CABG, A fib/flutter. - cardiology consulted 6/13 - continue ASA, plavix, lipitor - lasix gtt started 6/14  Acute renal failure from ATN. Non gap acidosis >> resolved. - f/u BMET  Anemia of critical illness and chronic disease. Thrombocytopenia in setting of ETOH. - f/u CBC  DM type II. - SSI with lantus  Acute metabolic encephalopathy. Hx of ETOH. - RASS goal 0 to -1 - thiamine, folic acid, MVI  DVT - SQ heparin SUP - pepcid Nutrition - tube feeds Goals of care - full code; if no improvement with vent weaning, then will need to discuss goals of care further with family  CC time 31 minutes  Coralyn Helling, MD [facility] Pulmonary/Critical Care 10/03/2017, 8:31 AM

## 2017-10-04 ENCOUNTER — Inpatient Hospital Stay (HOSPITAL_COMMUNITY): Payer: Medicare HMO

## 2017-10-04 DIAGNOSIS — I5043 Acute on chronic combined systolic (congestive) and diastolic (congestive) heart failure: Secondary | ICD-10-CM

## 2017-10-04 LAB — GLUCOSE, CAPILLARY
GLUCOSE-CAPILLARY: 104 mg/dL — AB (ref 65–99)
GLUCOSE-CAPILLARY: 165 mg/dL — AB (ref 65–99)
GLUCOSE-CAPILLARY: 193 mg/dL — AB (ref 65–99)
Glucose-Capillary: 164 mg/dL — ABNORMAL HIGH (ref 65–99)
Glucose-Capillary: 128 mg/dL — ABNORMAL HIGH (ref 65–99)
Glucose-Capillary: 136 mg/dL — ABNORMAL HIGH (ref 65–99)

## 2017-10-04 LAB — BASIC METABOLIC PANEL
Anion gap: 9 (ref 5–15)
BUN: 106 mg/dL — ABNORMAL HIGH (ref 6–20)
CO2: 21 mmol/L — AB (ref 22–32)
CREATININE: 1.85 mg/dL — AB (ref 0.61–1.24)
Calcium: 7.9 mg/dL — ABNORMAL LOW (ref 8.9–10.3)
Chloride: 105 mmol/L (ref 101–111)
GFR, EST AFRICAN AMERICAN: 38 mL/min — AB (ref >60)
GFR, EST NON AFRICAN AMERICAN: 33 mL/min — AB (ref >60)
GLUCOSE: 190 mg/dL — AB (ref 65–99)
Potassium: 3.8 mmol/L (ref 3.5–5.1)
Sodium: 135 mmol/L (ref 135–145)

## 2017-10-04 LAB — TSH: TSH: 4.19 u[IU]/mL (ref 0.350–4.500)

## 2017-10-04 MED ORDER — VITAL AF 1.2 CAL PO LIQD
1000.0000 mL | ORAL | Status: DC
  Administered 2017-10-05: 1000 mL
  Filled 2017-10-04 (×8): qty 1000

## 2017-10-04 NOTE — Progress Notes (Signed)
 Progress Note   Subjective   Remains intubated  Inpatient Medications    Scheduled Meds: . aspirin  81 mg Per Tube Daily  . atorvastatin  20 mg Per Tube Daily  . chlorhexidine gluconate (MEDLINE KIT)  15 mL Mouth Rinse BID  . clopidogrel  75 mg Per Tube Daily  . famotidine  20 mg Per Tube Daily  . folic acid  1 mg Per Tube Daily  . heparin  5,000 Units Subcutaneous Q8H  . insulin aspart  0-15 Units Subcutaneous Q4H  . insulin glargine  5 Units Subcutaneous QHS  . mouth rinse  15 mL Mouth Rinse 10 times per day  . multivitamin with minerals  1 tablet Per Tube Daily  . thiamine  100 mg Per Tube Daily   Continuous Infusions: . dexmedetomidine (PRECEDEX) IV infusion 0.9 mcg/kg/hr (10/04/17 0952)  . feeding supplement (VITAL AF 1.2 CAL) Stopped (10/04/17 0940)  . furosemide (LASIX) infusion 10 mg/hr (10/03/17 1405)   PRN Meds: docusate, fentaNYL (SUBLIMAZE) injection, ondansetron   Vital Signs    Vitals:   10/04/17 0800 10/04/17 0802 10/04/17 0900 10/04/17 1123  BP: 126/60  134/82   Pulse: 89 82 96 82  Resp: (!) 25 (!) 24 (!) 31 (!) 31  Temp: 99.1 F (37.3 C)     TempSrc: Oral     SpO2: 100% 100% 100% 100%  Weight:      Height:        Intake/Output Summary (Last 24 hours) at 10/04/2017 1129 Last data filed at 10/04/2017 0900 Gross per 24 hour  Intake 2049.3 ml  Output 2420 ml  Net -370.7 ml   Filed Weights   10/02/17 0416 10/03/17 0554 10/04/17 0500  Weight: 185 lb 6.5 oz (84.1 kg) 192 lb 14.4 oz (87.5 kg) 184 lb 1.3 oz (83.5 kg)    Telemetry    Sinus rhythm - Personally Reviewed  Physical Exam   GEN- The patient is ill appearing, sleeping but rouses   Head- normocephalic, atraumatic Eyes-  Sclera clear, conjunctiva pink Ears- hearing intact Oropharynx- clear Neck- supple, trach Lungs- Coarse breath sounds Heart- Regular rate and rhythm  GI- soft, NT, ND, + BS Extremities- + dependant edema  MS- diffuse muscle atrophy   Labs     Chemistry Recent Labs  Lab 10/02/17 0414 10/03/17 0615 10/04/17 0447  NA 132* 133* 135  K 3.8 3.8 3.8  CL 104 102 105  CO2 20* 21* 21*  GLUCOSE 197* 213* 190*  BUN 80* 91* 106*  CREATININE 1.55* 1.63* 1.85*  CALCIUM 7.8* 8.1* 7.9*  GFRNONAA 41* 38* 33*  GFRAA 47* 44* 38*  ANIONGAP 8 10 9     Hematology Recent Labs  Lab 10/01/17 0418 10/02/17 0414 10/03/17 0615  WBC 12.1* 13.0* 11.0*  RBC 3.11* 3.00* 2.88*  HGB 9.6* 9.2* 8.9*  HCT 30.1* 27.6* 26.7*  MCV 96.8 92.0 92.7  MCH 30.9 30.7 30.9  MCHC 31.9 33.3 33.3  RDW 16.8* 16.6* 17.0*  PLT 129* 154 163    Cardiac EnzymesNo results for input(s): TROPONINI in the last 168 hours. No results for input(s): TROPIPOC in the last 168 hours.      Assessment & Plan    1.  Acute on chronic systolic dysfunction EF 20% Remains clinically quite ill.  Continue diuresis as tolerated. I agree with Dr Branch that we should defer any ischemic workup until he is clinically improved. On ASA and plavix Consider beta blocker if BP remains stable Given renal   failure, would not start ACE I acutely.  Cardiology to follow  Thompson Grayer MD, [facility] 10/04/2017 11:29 AM

## 2017-10-04 NOTE — Progress Notes (Signed)
PULMONARY / CRITICAL CARE MEDICINE   Name: Johnny [name] MRN: [id number] DOB: October 11, 1934    ADMISSION DATE:  10/03/2017  REFERRING MD:  Dr. Suzi Roots EDP  CHIEF COMPLAINT:  Shock  HISTORY OF PRESENT ILLNESS:   83 yo male former smoker with respiratory failure and altered mental status after alcohol binge.  Found to have pneumonia, septic shock and intubated in ER. PMHx ETOH, Withdrawal seizures, CAD s/p CABG, HFrEF, PAF, DM, CVA, HLD, HTN.  SUBJECTIVE:   Still needing vent support.  VITAL SIGNS: BP 134/82   Pulse 96   Temp 99 F (37.2 C) (Oral)   Resp (!) 31   Ht 5' 10.5" (1.791 m)   Wt 184 lb 1.3 oz (83.5 kg)   SpO2 100%   BMI 26.04 kg/m   HEMODYNAMICS: CVP:  [7 mmHg-21 mmHg] 19 mmHg  VENTILATOR SETTINGS: Vent Mode: PCV FiO2 (%):  [40 %] 40 % Set Rate:  [20 bmp] 20 bmp PEEP:  [5 cmH20] 5 cmH20 Plateau Pressure:  [23 cmH20-26 cmH20] 25 cmH20  INTAKE / OUTPUT: I/O last 3 completed shifts: In: 3554.4 [I.V.:906.7; NG/GT:2647.7] Out: 3995 [Urine:3895; Stool:100]  PHYSICAL EXAMINATION:   General - pleasant Eyes - pupils reactive ENT - trach site clean Cardiac - regular, no murmur Chest - decreased BS at bases Abd - soft, non tender Ext - 1+ edema Skin - no rashes Neuro - follows commands  LABS:  BMET Recent Labs  Lab 10/02/17 0414 10/03/17 0615 10/04/17 0447  NA 132* 133* 135  K 3.8 3.8 3.8  CL 104 102 105  CO2 20* 21* 21*  BUN 80* 91* 106*  CREATININE 1.55* 1.63* 1.85*  GLUCOSE 197* 213* 190*   Electrolytes Recent Labs  Lab 09/29/17 2016 09/30/17 0433 10/01/17 0418 10/02/17 0414 10/03/17 0615 10/04/17 0447  CALCIUM 8.0* 8.2* 7.7* 7.8* 8.1* 7.9*  MG 1.8 1.9 2.0  --  1.7  --   PHOS 4.2 3.7 4.0  --   --   --    CBC Recent Labs  Lab 10/01/17 0418 10/02/17 0414 10/03/17 0615  WBC 12.1* 13.0* 11.0*  HGB 9.6* 9.2* 8.9*  HCT 30.1* 27.6* 26.7*  PLT 129* 154 163   Coag's No results for input(s): APTT, INR in the last 168 hours.  Sepsis  Markers No results for input(s): LATICACIDVEN, PROCALCITON, O2SATVEN in the last 168 hours. ABG No results for input(s): PHART, PCO2ART, PO2ART in the last 168 hours.   Liver Enzymes No results for input(s): AST, ALT, ALKPHOS, BILITOT, ALBUMIN in the last 168 hours.   Cardiac Enzymes No results for input(s): TROPONINI, PROBNP in the last 168 hours.   Glucose Recent Labs  Lab 10/03/17 1142 10/03/17 1601 10/03/17 1956 10/03/17 2336 10/04/17 0412 10/04/17 0806  GLUCAP 170* 194* 187* 232* 193* 164*   Imaging Dg Chest Port 1 View  Result Date: 10/04/2017 CLINICAL DATA:  Respiratory failure EXAM: PORTABLE CHEST 1 VIEW COMPARISON:  1 day prior FINDINGS: Right internal jugular line tip unchanged. Feeding tube extends beyond the inferior aspect of the film. Tracheostomy appropriately positioned. Midline trachea. Mild cardiomegaly. Atherosclerosis in the transverse aorta. Small layering bilateral pleural effusions. Shrapnel about the lower chest. No pneumothorax. Mild interstitial edema is similar. Bibasilar airspace disease is not significantly changed. IMPRESSION: No significant change since one day prior. Mild congestive heart failure with small bilateral pleural effusions and bibasilar airspace disease-likely atelectasis. Electronically Signed   By: Jeronimo Greaves M.D.   On: 10/04/2017 07:21    STUDIES:  CT head 6/05 >> atrophy, chronic small vessel ischemia Echo 6/06 >> EF 20 to 25%  CULTURES: Blood 6/05 >> negative  ANTIBIOTICS: Cefepime 6/5 > 6/10 Vancomycin 6/5 > 6/12  SIGNIFICANT EVENTS: 6/05 admit 6/13 cardiology consulted 6/14 start lasix gtt  LINES/TUBES: ETT 6/5>>>6/12 Rt IJ CVL 6/5>>> Trach 6/12>>>  DISCUSSION: 83 yo with aspiration pneumonia, acute hypoxic respiratory failure, acute on chronic systolic CHF with HFrEF in setting of alcoholic binge.  Failed to wean and required trach.  ASSESSMENT / PLAN:  Acute hypoxic respiratory failure from aspiration  pneumonia, pulmonary edema, and pleural effusions. Failure to wean s/p trach. - pressure support wean as able - pressure control as rest mode - f/u CXR - if pleural effusions persist, then consider thoracentesis later this week - trach care  Acute on chronic systolic CHF with HFrEF. CAD s/p CABG, A fib/flutter. - cardiology consulted 6/13 - continue ASA, plavix, lipitor - lasix gtt per cardiology  Acute renal failure from ATN. Non gap acidosis >> resolved. - baseline creatinine 1.08 from 05/05/17 - monitor renal fx while on lasix gtt  Anemia of critical illness and chronic disease. Thrombocytopenia in setting of ETOH. - f/u CBC intermittently  DM type II. - SSI with lantus  Acute metabolic encephalopathy. Hx of ETOH. - RASS goal 0 - thiamine, folic acid, MVI  Moderate protein calorie malnutrition. - decrease tube feeds to 50 ml/hr due to c/o abdominal bloating  Deconditioning. - will ask PT to assess  DVT - SQ heparin SUP - pepcid Nutrition - tube feeds Goals of care - full code; if no improvement with vent weaning, then will need to discuss goals of care further with family   Johnny HellingVineet Khristian Phillippi, MD [facility] Pulmonary/Critical Care 10/04/2017, 9:38 AM

## 2017-10-05 ENCOUNTER — Inpatient Hospital Stay (HOSPITAL_COMMUNITY): Payer: Medicare HMO

## 2017-10-05 LAB — BASIC METABOLIC PANEL
ANION GAP: 17 — AB (ref 5–15)
Anion gap: 13 (ref 5–15)
BUN: 115 mg/dL — AB (ref 6–20)
BUN: 120 mg/dL — AB (ref 6–20)
CALCIUM: 8.2 mg/dL — AB (ref 8.9–10.3)
CHLORIDE: 104 mmol/L (ref 101–111)
CO2: 20 mmol/L — ABNORMAL LOW (ref 22–32)
CO2: 18 mmol/L — ABNORMAL LOW (ref 22–32)
Calcium: 8.5 mg/dL — ABNORMAL LOW (ref 8.9–10.3)
Chloride: 103 mmol/L (ref 101–111)
Creatinine, Ser: 2.1 mg/dL — ABNORMAL HIGH (ref 0.61–1.24)
Creatinine, Ser: 2.23 mg/dL — ABNORMAL HIGH (ref 0.61–1.24)
GFR calc Af Amer: 33 mL/min — ABNORMAL LOW (ref >60)
GFR calc Af Amer: 30 mL/min — ABNORMAL LOW (ref >60)
GFR, EST NON AFRICAN AMERICAN: 28 mL/min — AB (ref >60)
GFR, EST NON AFRICAN AMERICAN: 26 mL/min — AB (ref >60)
GLUCOSE: 172 mg/dL — AB (ref 65–99)
GLUCOSE: 242 mg/dL — AB (ref 65–99)
POTASSIUM: 3.7 mmol/L (ref 3.5–5.1)
POTASSIUM: 3.6 mmol/L (ref 3.5–5.1)
SODIUM: 136 mmol/L (ref 135–145)
Sodium: 139 mmol/L (ref 135–145)

## 2017-10-05 LAB — GLUCOSE, CAPILLARY
GLUCOSE-CAPILLARY: 154 mg/dL — AB (ref 65–99)
GLUCOSE-CAPILLARY: 155 mg/dL — AB (ref 65–99)
Glucose-Capillary: 100 mg/dL — ABNORMAL HIGH (ref 65–99)
Glucose-Capillary: 158 mg/dL — ABNORMAL HIGH (ref 65–99)
Glucose-Capillary: 177 mg/dL — ABNORMAL HIGH (ref 65–99)
Glucose-Capillary: 204 mg/dL — ABNORMAL HIGH (ref 65–99)
Glucose-Capillary: 33 mg/dL — CL (ref 65–99)

## 2017-10-05 LAB — CBC
HCT: 25.9 % — ABNORMAL LOW (ref 39.0–52.0)
Hemoglobin: 8.5 g/dL — ABNORMAL LOW (ref 13.0–17.0)
MCH: 30.1 pg (ref 26.0–34.0)
MCHC: 32.8 g/dL (ref 30.0–36.0)
MCV: 91.8 fL (ref 78.0–100.0)
PLATELETS: 209 10*3/uL (ref 150–400)
RBC: 2.82 MIL/uL — ABNORMAL LOW (ref 4.22–5.81)
RDW: 17.4 % — ABNORMAL HIGH (ref 11.5–15.5)
WBC: 14.6 10*3/uL — ABNORMAL HIGH (ref 4.0–10.5)

## 2017-10-05 LAB — MAGNESIUM
MAGNESIUM: 1.6 mg/dL — AB (ref 1.7–2.4)
Magnesium: 1.7 mg/dL (ref 1.7–2.4)

## 2017-10-05 MED ORDER — DEXTROSE 10 % IV SOLN
INTRAVENOUS | Status: DC
  Administered 2017-10-06 (×2): via INTRAVENOUS

## 2017-10-05 MED ORDER — MAGNESIUM SULFATE 2 GM/50ML IV SOLN
2.0000 g | Freq: Once | INTRAVENOUS | Status: AC
  Administered 2017-10-05: 2 g via INTRAVENOUS
  Filled 2017-10-05: qty 50

## 2017-10-05 MED ORDER — PHENYLEPHRINE HCL-NACL 10-0.9 MG/250ML-% IV SOLN
0.0000 ug/min | INTRAVENOUS | Status: DC
  Filled 2017-10-05: qty 250

## 2017-10-05 MED ORDER — POTASSIUM CHLORIDE CRYS ER 20 MEQ PO TBCR
40.0000 meq | EXTENDED_RELEASE_TABLET | Freq: Once | ORAL | Status: AC
  Administered 2017-10-05: 40 meq via ORAL
  Filled 2017-10-05: qty 2

## 2017-10-05 MED ORDER — METOPROLOL TARTRATE 5 MG/5ML IV SOLN
INTRAVENOUS | Status: AC
  Administered 2017-10-05: 2.5 mg via INTRAVENOUS
  Filled 2017-10-05: qty 5

## 2017-10-05 MED ORDER — DEXTROSE 50 % IV SOLN
INTRAVENOUS | Status: AC
  Administered 2017-10-05: 25 mL
  Filled 2017-10-05: qty 50

## 2017-10-05 MED ORDER — ATROPINE SULFATE 1 % OP SOLN
2.0000 [drp] | OPHTHALMIC | Status: DC | PRN
  Administered 2017-10-05 – 2017-10-21 (×9): 2 [drp] via SUBLINGUAL
  Filled 2017-10-05 (×2): qty 2

## 2017-10-05 MED ORDER — METOPROLOL TARTRATE 5 MG/5ML IV SOLN
2.5000 mg | Freq: Once | INTRAVENOUS | Status: AC
  Administered 2017-10-05: 2.5 mg via INTRAVENOUS

## 2017-10-05 MED ORDER — CARVEDILOL 3.125 MG PO TABS
3.1250 mg | ORAL_TABLET | Freq: Two times a day (BID) | ORAL | Status: DC
  Administered 2017-10-05 – 2017-10-08 (×6): 3.125 mg via ORAL
  Filled 2017-10-05 (×6): qty 1

## 2017-10-05 MED ORDER — PROMETHAZINE HCL 25 MG/ML IJ SOLN
12.5000 mg | Freq: Four times a day (QID) | INTRAMUSCULAR | Status: DC | PRN
  Administered 2017-10-05 – 2017-10-24 (×3): 12.5 mg via INTRAVENOUS
  Filled 2017-10-05 (×3): qty 1

## 2017-10-05 NOTE — Progress Notes (Signed)
PULMONARY / CRITICAL CARE MEDICINE   Name: Johnny [name] MRN: [id number] DOB: May 09, 1934    ADMISSION DATE:  10/07/2017 CONSULTATION DATE:  10/07/2017  REFERRING MD:  Suzi RootsMilled EDP  CHIEF COMPLAINT:  Aspiration, respiratory failure  HISTORY OF PRESENT ILLNESS:   83 y/o male with a history of alcoholism, systolic heart failure and afib presented to the ER on 6/5 with acute respiratory failure in the setting of aspiration after an alcohol binge.     SUBJECTIVE:  No acute issues   VITAL SIGNS: BP 127/69   Pulse 77   Temp 97.8 F (36.6 C) (Axillary)   Resp (!) 21   Ht 5' 10.5" (1.791 m)   Wt 182 lb 12.2 oz (82.9 kg)   SpO2 100%   BMI 25.85 kg/m   HEMODYNAMICS: CVP:  [15 mmHg-18 mmHg] 18 mmHg  VENTILATOR SETTINGS: Vent Mode: PCV FiO2 (%):  [40 %] 40 % Set Rate:  [20 bmp] 20 bmp PEEP:  [5 cmH20] 5 cmH20 Pressure Support:  [10 cmH20] 10 cmH20 Plateau Pressure:  [19 cmH20-22 cmH20] 22 cmH20  INTAKE / OUTPUT: I/O last 3 completed shifts: In: 2394.4 [I.V.:983.1; NG/GT:1411.3] Out: 3665 [Urine:3665]  PHYSICAL EXAMINATION: General:  In bed on vent HENT: NCAT Trach in place PULM: Some rhonchi bases B, vent supported breathing CV: Irreg irreg, no mgr GI: BS+, soft, nontender MSK: normal bulk and tone Neuro: awake, on vent, following commands for me (showing two fingers)    LABS:  BMET Recent Labs  Lab 10/03/17 0615 10/04/17 0447 10/05/17 0346  NA 133* 135 136  K 3.8 3.8 3.7  CL 102 105 103  CO2 21* 21* 20*  BUN 91* 106* 115*  CREATININE 1.63* 1.85* 2.10*  GLUCOSE 213* 190* 172*    Electrolytes Recent Labs  Lab 09/29/17 2016 09/30/17 0433 10/01/17 0418  10/03/17 0615 10/04/17 0447 10/05/17 0346  CALCIUM 8.0* 8.2* 7.7*   < > 8.1* 7.9* 8.2*  MG 1.8 1.9 2.0  --  1.7  --  1.6*  PHOS 4.2 3.7 4.0  --   --   --   --    < > = values in this interval not displayed.    CBC Recent Labs  Lab 10/02/17 0414 10/03/17 0615 10/05/17 0346  WBC 13.0* 11.0* 14.6*   HGB 9.2* 8.9* 8.5*  HCT 27.6* 26.7* 25.9*  PLT 154 163 209    Coag's No results for input(s): APTT, INR in the last 168 hours.  Sepsis Markers No results for input(s): LATICACIDVEN, PROCALCITON, O2SATVEN in the last 168 hours.  ABG No results for input(s): PHART, PCO2ART, PO2ART in the last 168 hours.  Liver Enzymes No results for input(s): AST, ALT, ALKPHOS, BILITOT, ALBUMIN in the last 168 hours.  Cardiac Enzymes No results for input(s): TROPONINI, PROBNP in the last 168 hours.  Glucose Recent Labs  Lab 10/04/17 1218 10/04/17 1613 10/04/17 1955 10/04/17 2345 10/05/17 0342 10/05/17 0750  GLUCAP 128* 136* 165* 104* 154* 158*    Imaging Dg Chest Port 1 View  Result Date: 10/05/2017 CLINICAL DATA:  Respiratory failure, shortness of breath. EXAM: PORTABLE CHEST 1 VIEW COMPARISON:  Portable chest x-ray of October 04, 2017 FINDINGS: The lungs are adequately inflated. The tracheostomy tube tip projects between the clavicular heads. Bibasilar atelectasis or pneumonia is present greatest on the left. There small bilateral pleural effusions which appears stable. The heart is top-normal in size. The pulmonary vascularity is mildly engorged. The patient has undergone previous CABG. A feeding tube is  present whose tip projects below the inferior margin of the image. The right internal jugular venous catheter tip projects over the midportion of the SVC. IMPRESSION: Fairly stable changes of CHF with mild interstitial edema and small bilateral pleural effusions. There may be bibasilar atelectasis greatest on the left. Electronically Signed   By: David  [place] M.D.   On: 10/05/2017 07:11     STUDIES:  CT head 6/05 >> atrophy, chronic small vessel ischemia Echo 6/06 >> EF 20 to 25%  CULTURES: Blood 6/05 >> negative  ANTIBIOTICS: Cefepime 6/5 > 6/10 Vancomycin 6/5 > 6/12  SIGNIFICANT EVENTS: 6/05 admit 6/13 cardiology consulted 6/14 start lasix gtt  LINES/TUBES: ETT  6/5>>>6/12 Rt IJ CVL 6/5>>> Trach 6/12>>>    DISCUSSION: 83 y/o male with aspiration pneumonia leading to acute respiratory failure with hypoxemia.  Situation complicated by chronic systolic heart failure and atrial fibrillation.  He has been slow to wean and has required a tracheostomy.    ASSESSMENT / PLAN:  PULMONARY A: Acute respiratory failure with hypoxemia Aspiration pneumonia Acute pulmonary edema Pleural effusions P:   Wean vent to pressure support today then resume full mechanical vent support VAP prevention Daily WUA/SBT Continue diuresis   CARDIOVASCULAR A:  Chronic systolic heart failure  CAD Afib P:  Tele Cardiology following Continue plavix, statin, asa Lasix gtt to continue another day, then hold  RENAL A:   Acute on chronic kidney failure > labs worsening, urine output is good and mental status has improved despite increasing BUN P:   Would continue diuresis again today and then hold on 6/18 Monitor BMET and UOP Replace electrolytes as needed   GASTROINTESTINAL A:   No acute issue P:   Continue tube feeding Continue famotidine for stress ulcer prophylaxis   HEMATOLOGIC A:   Anemia and thrombocytopenia of chronic disease (alcoholism) P:  Monitor for bleeding Transfuse for Hgb < 8gm/dL  INFECTIOUS A:   Aspiration pneumonia P:   Monitor off of antibiotics  ENDOCRINE A:   DM2   P:   Continue SSI and glargine  NEUROLOGIC A:   EtOH abuse P:   RASS goal: 0 Continue multi vitamin, folate, thiamine Wean off precedex tdoay   FAMILY  - Updates: none bedside   My cc time 32 minutes  Heber Chamblee, MD [place] [facility] Pager: [phone] Cell: [phone] After 3pm or if no response, call [phone]   10/05/2017, 9:48 AM

## 2017-10-05 NOTE — Evaluation (Signed)
Physical Therapy Evaluation Patient Details Name: Johnny [name] MRN: [id number] DOB: 03/17/1935 Today's Date: 10/05/2017   History of Present Illness  83 yo male former smoker with respiratory failure and altered mental status after alcohol binge.  Found to have pneumonia, septic shock and intubated in ER. Tracheostomy 6/12  Clinical Impression  Pt with tracheostomy and no caregivers/family present to aid in determining PLOF, and living arrangements. Pt is limited in his safe mobility by decreased strength and ROM as well as increased fatigue. Pt currently requires maxAx2 for coming to seated EoB. Pt could only tolerate 3 minutes of sitting, 1 minute of which did not require outside assist. Pt then required totalAx2 to return to supine. PT currently recommends SNF level rehab at d/c. PT will continue to follow acutely.     Follow Up Recommendations SNF    Equipment Recommendations  None recommended by PT    Recommendations for Other Services       Precautions / Restrictions Precautions Precautions: Fall Restrictions Weight Bearing Restrictions: No      Mobility  Bed Mobility Overal bed mobility: Needs Assistance Bed Mobility: Supine to Sit;Sit to Supine     Supine to sit: Max assist;+2 for physical assistance;HOB elevated Sit to supine: Total assist;+2 for physical assistance   General bed mobility comments: pt initiated movement but required maxAx2 fro trunk to uprigth and LE off of bed, after fatigue with sitting required total Ax2 for returning to bed  Transfers                 General transfer comment: unable to attempt        Balance Overall balance assessment: Needs assistance Sitting-balance support: Feet supported;Bilateral upper extremity supported Sitting balance-Leahy Scale: Poor Sitting balance - Comments: initially requires assist to maintain balance, sat for 3 minutes and able to perform 1 minute with min guard assit.                                      Pertinent Vitals/Pain Pain Assessment: Faces Faces Pain Scale: Hurts even more Pain Location: generalized Pain Descriptors / Indicators: Grimacing Pain Intervention(s): Repositioned;Monitored during session;Limited activity within patient's tolerance    Home Living Family/patient expects to be discharged to:: Unsure                 Additional Comments: no caregiver present at time of evaluation    Prior Function           Comments: no caregiver present to determine PLOF, from notes pt able to transfer to wheelchair        Extremity/Trunk Assessment   Upper Extremity Assessment Upper Extremity Assessment: Generalized weakness    Lower Extremity Assessment Lower Extremity Assessment: RLE deficits/detail;LLE deficits/detail RLE Deficits / Details: AAROM limited due to stiffness, strength grossly 2/5 RLE Sensation: decreased light touch RLE Coordination: decreased gross motor;decreased fine motor LLE Deficits / Details: AAROM limited due to stiffness, strength grossly 2/5 LLE Sensation: decreased light touch       Communication   Communication: Tracheostomy  Cognition Arousal/Alertness: Awake/alert Behavior During Therapy: WFL for tasks assessed/performed Overall Cognitive Status: Difficult to assess                                        General Comments General comments (skin integrity,  edema, etc.): pt on full vent support, FiO2 40%O2, PEEp 5, BP 104/56, RR 90 bpm, RR 25, VSS        Assessment/Plan    PT Assessment Patient needs continued PT services  PT Problem List Decreased strength;Decreased range of motion;Decreased activity tolerance;Decreased balance;Decreased mobility;Decreased cognition;Decreased coordination;Decreased safety awareness;Pain       PT Treatment Interventions DME instruction;Functional mobility training;Therapeutic activities;Therapeutic exercise;Balance training;Cognitive  remediation;Patient/family education;Wheelchair mobility training    PT Goals (Current goals can be found in the Care Plan section)  Acute Rehab PT Goals Patient Stated Goal: none stated PT Goal Formulation: Patient unable to participate in goal setting Time For Goal Achievement: 10/19/17 Potential to Achieve Goals: Fair    Frequency Min 2X/week    AM-PAC PT "6 Clicks" Daily Activity  Outcome Measure Difficulty turning over in bed (including adjusting bedclothes, sheets and blankets)?: Unable Difficulty moving from lying on back to sitting on the side of the bed? : Unable Difficulty sitting down on and standing up from a chair with arms (e.g., wheelchair, bedside commode, etc,.)?: Unable Help needed moving to and from a bed to chair (including a wheelchair)?: Total Help needed walking in hospital room?: Total Help needed climbing 3-5 steps with a railing? : Total 6 Click Score: 6    End of Session   Activity Tolerance: Patient limited by fatigue;Patient limited by pain Patient left: in bed;with call bell/phone within reach;with bed alarm set;with restraints reapplied Nurse Communication: Mobility status PT Visit Diagnosis: Other abnormalities of gait and mobility (R26.89);Muscle weakness (generalized) (M62.81);Difficulty in walking, not elsewhere classified (R26.2)    Time: 4098-1191 PT Time Calculation (min) (ACUTE ONLY): 14 min   Charges:   PT Evaluation $PT Eval High Complexity: 1 High     PT G Codes:        Johnny [name] PT, DPT Acute Rehabilitation  [phone] Pager [phone]    Johnny [name] 10/05/2017, 3:06 PM

## 2017-10-05 NOTE — Progress Notes (Signed)
eLink Physician-Brief Progress Note Patient Name: Johnny NewcomerDan E Toops DOB: [birth year] MRN: [id number]   Date of Service  10/05/2017  HPI/Events of Note  Hypoglycemia - Blood glucose = 33. Given D50 already.  eICU Interventions  Will order: 1. D10W IV infusion at 50 mL/hour.      Intervention Category Major Interventions: Other:  [name],Alera Quevedo [place] 10/05/2017, 11:50 PM

## 2017-10-05 NOTE — Progress Notes (Signed)
Reviewed recent events. Two episodes of wide complex tachycardia, around 220 bpm. The first is lengthy, relatively regular and monomorphic 220 bpm. Reportedly pulseless during that event. The second was shorter, 25 beats, but with a more polymorphic (torsades-like) appearance. Both resolved spontaneously. Arrhythmia appeared to lessen after IV metoprolol. QT appears prolonged.  Electrolytes are pending (just drawn). K was 3.7 this AM, borderline low. Diuretics have been stopped.  Check ECG for more precise QT assessment. Avoid amio if QT is prolonged. Correct electrolytes (K>4.0, Mg > 1.9). For now, IV beta blocker appears to suffice.  Thurmon FairMihai Sandi Towe, MD, [facility] [phone] office [phone] pager

## 2017-10-05 NOTE — Progress Notes (Addendum)
Progress Note  Cardiologist: Peter Martinique, MD   Subjective   Awake on trach  Inpatient Medications    Scheduled Meds: . aspirin  81 mg Per Tube Daily  . atorvastatin  20 mg Per Tube Daily  . chlorhexidine gluconate (MEDLINE KIT)  15 mL Mouth Rinse BID  . clopidogrel  75 mg Per Tube Daily  . famotidine  20 mg Per Tube Daily  . folic acid  1 mg Per Tube Daily  . heparin  5,000 Units Subcutaneous Q8H  . insulin aspart  0-15 Units Subcutaneous Q4H  . insulin glargine  5 Units Subcutaneous QHS  . mouth rinse  15 mL Mouth Rinse 10 times per day  . multivitamin with minerals  1 tablet Per Tube Daily  . thiamine  100 mg Per Tube Daily   Continuous Infusions: . dexmedetomidine (PRECEDEX) IV infusion 0.7 mcg/kg/hr (10/05/17 1001)  . feeding supplement (VITAL AF 1.2 CAL) 50 mL/hr at 10/05/17 0800  . furosemide (LASIX) infusion 10 mg/hr (10/05/17 0800)   PRN Meds: docusate, fentaNYL (SUBLIMAZE) injection, ondansetron   Vital Signs    Vitals:   10/05/17 0700 10/05/17 0800 10/05/17 0801 10/05/17 0806  BP: (!) 115/57 127/69    Pulse: 69 77    Resp: 18 (!) 25 (!) 21   Temp:    97.8 F (36.6 C)  TempSrc:    Axillary  SpO2: 100% 100%    Weight:      Height:        Intake/Output Summary (Last 24 hours) at 10/05/2017 1043 Last data filed at 10/05/2017 0800 Gross per 24 hour  Intake 1074.81 ml  Output 2565 ml  Net -1490.19 ml   Filed Weights   10/03/17 0554 10/04/17 0500 10/05/17 0357  Weight: 192 lb 14.4 oz (87.5 kg) 184 lb 1.3 oz (83.5 kg) 182 lb 12.2 oz (82.9 kg)    Telemetry    Sinus rhythm, 4 bt run NSVT, otherwise, not much ectopy - Personally Reviewed  Cardiology Studies    ECHO: 09/24/2017 - Left ventricle: The cavity size was severely dilated. Systolic   function was severely reduced. The estimated ejection fraction   was in the range of 20% to 25%. Diffuse hypokinesis. The study is   not technically sufficient to allow evaluation of LV diastolic  function. - Mitral valve: Severely calcified annulus. Moderately thickened   leaflets . There was mild regurgitation. - Left atrium: The atrium was mildly dilated.  Physical Exam   GEN- The patient is ill appearing, turns head a little to verbal   Head- normocephalic, atraumatic Eyes-  Sclera clear, conjunctiva pink Ears- hearing intact Oropharynx- clear Neck- supple, trach in place, unable to determine JVD due to equpment Lungs- Coarse breath sounds bases  Heart- Regular rate and rhythm, no sig murmur  GI- soft, NT, ND, + BS Extremities-  No edema  MS- diffuse muscle atrophy  Radiology    Dg Chest Port 1 View  Result Date: 10/05/2017 CLINICAL DATA:  Respiratory failure, shortness of breath. EXAM: PORTABLE CHEST 1 VIEW COMPARISON:  Portable chest x-ray of October 04, 2017 FINDINGS: The lungs are adequately inflated. The tracheostomy tube tip projects between the clavicular heads. Bibasilar atelectasis or pneumonia is present greatest on the left. There small bilateral pleural effusions which appears stable. The heart is top-normal in size. The pulmonary vascularity is mildly engorged. The patient has undergone previous CABG. A feeding tube is present whose tip projects below the inferior margin of the image. The right  internal jugular venous catheter tip projects over the midportion of the SVC. IMPRESSION: Fairly stable changes of CHF with mild interstitial edema and small bilateral pleural effusions. There may be bibasilar atelectasis greatest on the left. Electronically Signed   By: David  Martinique M.D.   On: 10/05/2017 07:11   Dg Chest Port 1 View Result Date: 10/04/2017 CLINICAL DATA:  Respiratory failure EXAM: PORTABLE CHEST 1 VIEW COMPARISON:  1 day prior FINDINGS: Right internal jugular line tip unchanged. Feeding tube extends beyond the inferior aspect of the film. Tracheostomy appropriately positioned. Midline trachea. Mild cardiomegaly. Atherosclerosis in the transverse aorta. Small  layering bilateral pleural effusions. Shrapnel about the lower chest. No pneumothorax. Mild interstitial edema is similar. Bibasilar airspace disease is not significantly changed. IMPRESSION: No significant change since one day prior. Mild congestive heart failure with small bilateral pleural effusions and bibasilar airspace disease-likely atelectasis. Electronically Signed   By: Abigail Miyamoto M.D.   On: 10/04/2017 07:21    Labs    Chemistry Recent Labs  Lab 10/03/17 0615 10/04/17 0447 10/05/17 0346  NA 133* 135 136  K 3.8 3.8 3.7  CL 102 105 103  CO2 21* 21* 20*  GLUCOSE 213* 190* 172*  BUN 91* 106* 115*  CREATININE 1.63* 1.85* 2.10*  CALCIUM 8.1* 7.9* 8.2*  GFRNONAA 38* 33* 28*  GFRAA 44* 38* 33*  ANIONGAP '10 9 13     ' Hematology Recent Labs  Lab 10/02/17 0414 10/03/17 0615 10/05/17 0346  WBC 13.0* 11.0* 14.6*  RBC 3.00* 2.88* 2.82*  HGB 9.2* 8.9* 8.5*  HCT 27.6* 26.7* 25.9*  MCV 92.0 92.7 91.8  MCH 30.7 30.9 30.1  MCHC 33.3 33.3 32.8  RDW 16.6* 17.0* 17.4*  PLT 154 163 209    Cardiac Enzymes:   Lab Results  Component Value Date   TROPONINI 0.17 (HH) 09/25/2017     Assessment & Plan    1.  Acute on chronic systolic dysfunction - EF 20% now, was 40-45% 10/2016 echo - peak troponin 0.28 - Remains clinically quite ill.   - Defer any ischemic workup until he is clinically improved. - -On ASA, statin, and plavix - with increase in BUN/Cr, will d/c IV Lasix. - add low-dose Coreg since SBP 110s and higher - no ACE/ARB/Entresto due to ARF - mild CHF and small pleural effusions on echo, MD advise on ongoing Lasix.  Cardiology to follow  Rosaria Ferries 10/05/2017 10:43 AM   I have seen and examined the patient along with Rosaria Ferries, PA.  I have reviewed the chart, notes and new data.  I agree with PA's note.  Key new complaints: opens eyes, otherwise does not interact Key examination changes: lying flat without evidence of respiratory difficulty. He looks  "dry". Key new findings / data: markedly elevated BUN and rising creatinine  PLAN: Stop diuretics. Reevaluate LV systolic function by echo once withdrawal sd has resolved, before we pursue further diagnostic testing for CAD.  Sanda Klein, MD, [place] [phone] 10/05/2017, 1:10 PM

## 2017-10-05 NOTE — Progress Notes (Addendum)
Hypoglycemic Event  CBG: 33  Treatment: D50 IV 25 mL  Symptoms: None  Follow-up CBG: Time: 2353 CBG Result: 100  Possible Reasons for Event: Inadequate meal intake    Irwin BrakemanKanisha N Anatole Apollo

## 2017-10-05 NOTE — Progress Notes (Signed)
eLink Physician-Brief Progress Note Patient Name: Johnny [name] DOB: February 17, 1935 MRN: [id number]   Date of Service  10/05/2017  HPI/Events of Note  Nausea - Refractory to Zofran.  eICU Interventions  Will order: 1. Phenergan 12.5 mg IV now and Q 6 hours PRN N/V.     Intervention Category Major Interventions: Other:  Johnny [name],Johnny [name] 10/05/2017, 8:27 PM

## 2017-10-05 NOTE — Progress Notes (Signed)
eLink Physician-Brief Progress Note Patient Name: Johnny NewcomerDan E Bruemmer DOB: [birth year] MRN: [id number]   Date of Service  10/05/2017  HPI/Events of Note  Copious oral secretions.   eICU Interventions  Will order Atropine Solution 2 drops Q 4 hours PRN.      Intervention Category Major Interventions: Other:  [name],Steven Eugene 10/05/2017, 9:49 PM

## 2017-10-05 NOTE — Progress Notes (Signed)
[facility]  Called emergently to the bedside for VT, confusion On my arrival the patient had a pulse, was awake, in afib with RVR Ordered 12 lead EKG, BMET, Mg Metoprolol IV 2.5 stat Will have nurses notify cardiology to review strip from earlier> was this VT or Afib with aberrancy? Do we need amiodarone, defer to cardiology  Heber CarolinaBrent Ly Wass, MD [facility] Pager: [phone] Cell: [phone] After 3pm or if no response, call [phone]

## 2017-10-06 DIAGNOSIS — I480 Paroxysmal atrial fibrillation: Secondary | ICD-10-CM

## 2017-10-06 DIAGNOSIS — I471 Supraventricular tachycardia: Secondary | ICD-10-CM

## 2017-10-06 DIAGNOSIS — I472 Ventricular tachycardia: Secondary | ICD-10-CM

## 2017-10-06 LAB — HEPATIC FUNCTION PANEL
ALBUMIN: 2.3 g/dL — AB (ref 3.5–5.0)
ALT: 46 U/L (ref 17–63)
AST: 59 U/L — ABNORMAL HIGH (ref 15–41)
Alkaline Phosphatase: 152 U/L — ABNORMAL HIGH (ref 38–126)
BILIRUBIN INDIRECT: 0.9 mg/dL (ref 0.3–0.9)
Bilirubin, Direct: 0.4 mg/dL (ref 0.1–0.5)
TOTAL PROTEIN: 6.6 g/dL (ref 6.5–8.1)
Total Bilirubin: 1.3 mg/dL — ABNORMAL HIGH (ref 0.3–1.2)

## 2017-10-06 LAB — LIPASE, BLOOD: LIPASE: 25 U/L (ref 11–51)

## 2017-10-06 LAB — BASIC METABOLIC PANEL
ANION GAP: 15 (ref 5–15)
BUN: 120 mg/dL — AB (ref 6–20)
CALCIUM: 8.7 mg/dL — AB (ref 8.9–10.3)
CO2: 20 mmol/L — ABNORMAL LOW (ref 22–32)
Chloride: 105 mmol/L (ref 101–111)
Creatinine, Ser: 2.26 mg/dL — ABNORMAL HIGH (ref 0.61–1.24)
GFR calc Af Amer: 30 mL/min — ABNORMAL LOW (ref >60)
GFR, EST NON AFRICAN AMERICAN: 26 mL/min — AB (ref >60)
Glucose, Bld: 107 mg/dL — ABNORMAL HIGH (ref 65–99)
POTASSIUM: 3.8 mmol/L (ref 3.5–5.1)
SODIUM: 140 mmol/L (ref 135–145)

## 2017-10-06 LAB — GLUCOSE, CAPILLARY
GLUCOSE-CAPILLARY: 87 mg/dL (ref 65–99)
GLUCOSE-CAPILLARY: 112 mg/dL — AB (ref 65–99)
GLUCOSE-CAPILLARY: 167 mg/dL — AB (ref 65–99)
GLUCOSE-CAPILLARY: 176 mg/dL — AB (ref 65–99)
Glucose-Capillary: 124 mg/dL — ABNORMAL HIGH (ref 65–99)
Glucose-Capillary: 161 mg/dL — ABNORMAL HIGH (ref 65–99)

## 2017-10-06 MED ORDER — FUROSEMIDE 10 MG/ML IJ SOLN
80.0000 mg | Freq: Four times a day (QID) | INTRAMUSCULAR | Status: AC
  Administered 2017-10-06 (×2): 80 mg via INTRAVENOUS
  Filled 2017-10-06 (×3): qty 8

## 2017-10-06 MED ORDER — INSULIN ASPART 100 UNIT/ML ~~LOC~~ SOLN
0.0000 [IU] | SUBCUTANEOUS | Status: DC
  Administered 2017-10-06 – 2017-10-07 (×3): 2 [IU] via SUBCUTANEOUS
  Administered 2017-10-07: 1 [IU] via SUBCUTANEOUS
  Administered 2017-10-07: 2 [IU] via SUBCUTANEOUS
  Administered 2017-10-07: 3 [IU] via SUBCUTANEOUS
  Administered 2017-10-07 (×2): 2 [IU] via SUBCUTANEOUS
  Administered 2017-10-08: 1 [IU] via SUBCUTANEOUS
  Administered 2017-10-08 (×2): 2 [IU] via SUBCUTANEOUS
  Administered 2017-10-08: 1 [IU] via SUBCUTANEOUS
  Administered 2017-10-08 – 2017-10-09 (×5): 2 [IU] via SUBCUTANEOUS
  Administered 2017-10-10: 5 [IU] via SUBCUTANEOUS
  Administered 2017-10-10: 3 [IU] via SUBCUTANEOUS
  Administered 2017-10-10: 2 [IU] via SUBCUTANEOUS
  Administered 2017-10-10: 1 [IU] via SUBCUTANEOUS
  Administered 2017-10-10: 3 [IU] via SUBCUTANEOUS
  Administered 2017-10-11 (×2): 2 [IU] via SUBCUTANEOUS
  Administered 2017-10-11 – 2017-10-12 (×2): 1 [IU] via SUBCUTANEOUS
  Administered 2017-10-12: 3 [IU] via SUBCUTANEOUS
  Administered 2017-10-12 (×3): 2 [IU] via SUBCUTANEOUS
  Administered 2017-10-12: 1 [IU] via SUBCUTANEOUS
  Administered 2017-10-13: 3 [IU] via SUBCUTANEOUS
  Administered 2017-10-13: 2 [IU] via SUBCUTANEOUS
  Administered 2017-10-13: 3 [IU] via SUBCUTANEOUS
  Administered 2017-10-13: 5 [IU] via SUBCUTANEOUS
  Administered 2017-10-13: 2 [IU] via SUBCUTANEOUS
  Administered 2017-10-13: 3 [IU] via SUBCUTANEOUS
  Administered 2017-10-14 (×2): 2 [IU] via SUBCUTANEOUS
  Administered 2017-10-14: 3 [IU] via SUBCUTANEOUS
  Administered 2017-10-14 – 2017-10-15 (×3): 2 [IU] via SUBCUTANEOUS
  Administered 2017-10-15: 3 [IU] via SUBCUTANEOUS
  Administered 2017-10-15: 2 [IU] via SUBCUTANEOUS
  Administered 2017-10-15: 3 [IU] via SUBCUTANEOUS
  Administered 2017-10-15: 2 [IU] via SUBCUTANEOUS
  Administered 2017-10-16 – 2017-10-19 (×11): 1 [IU] via SUBCUTANEOUS
  Administered 2017-10-20: 2 [IU] via SUBCUTANEOUS
  Administered 2017-10-20 – 2017-10-21 (×4): 1 [IU] via SUBCUTANEOUS
  Administered 2017-10-21: 2 [IU] via SUBCUTANEOUS
  Administered 2017-10-21: 1 [IU] via SUBCUTANEOUS
  Administered 2017-10-21: 2 [IU] via SUBCUTANEOUS
  Administered 2017-10-21: 1 [IU] via SUBCUTANEOUS
  Administered 2017-10-22 (×2): 2 [IU] via SUBCUTANEOUS
  Administered 2017-10-22 (×2): 3 [IU] via SUBCUTANEOUS
  Administered 2017-10-22: 2 [IU] via SUBCUTANEOUS
  Administered 2017-10-22: 1 [IU] via SUBCUTANEOUS
  Administered 2017-10-23 (×2): 2 [IU] via SUBCUTANEOUS
  Administered 2017-10-23: 1 [IU] via SUBCUTANEOUS
  Administered 2017-10-23 – 2017-10-24 (×4): 2 [IU] via SUBCUTANEOUS
  Administered 2017-10-24: 3 [IU] via SUBCUTANEOUS
  Administered 2017-10-24 (×3): 1 [IU] via SUBCUTANEOUS
  Administered 2017-10-25 (×2): 2 [IU] via SUBCUTANEOUS
  Administered 2017-10-25: 3 [IU] via SUBCUTANEOUS

## 2017-10-06 NOTE — Progress Notes (Signed)
Trach care done per RRT, pt tolerated it well no distress or complications noted.

## 2017-10-06 NOTE — Progress Notes (Signed)
Progress Note  Patient Name: Johnny [name] Date of Encounter: 10/06/2017  Primary Cardiologist: Peter Martinique, MD   Subjective   No further serious ventricular arrhythmia since the events around 1630 yesterday. Needs you more relatively low, although still in normal range.  QTc 528 ms on ECG (naturally prolonged due to right bundle branch block), similar to the admission ECG, but longer compared to a year ago at 485-504 ms.  Inpatient Medications    Scheduled Meds: . aspirin  81 mg Per Tube Daily  . atorvastatin  20 mg Per Tube Daily  . carvedilol  3.125 mg Oral BID WC  . chlorhexidine gluconate (MEDLINE KIT)  15 mL Mouth Rinse BID  . clopidogrel  75 mg Per Tube Daily  . famotidine  20 mg Per Tube Daily  . folic acid  1 mg Per Tube Daily  . furosemide  80 mg Intravenous Q6H  . heparin  5,000 Units Subcutaneous Q8H  . insulin aspart  0-15 Units Subcutaneous Q4H  . insulin glargine  5 Units Subcutaneous QHS  . mouth rinse  15 mL Mouth Rinse 10 times per day  . multivitamin with minerals  1 tablet Per Tube Daily  . thiamine  100 mg Per Tube Daily   Continuous Infusions: . dextrose 50 mL/hr at 10/06/17 1000  . feeding supplement (VITAL AF 1.2 CAL) Stopped (10/05/17 1900)  . phenylephrine (NEO-SYNEPHRINE) Adult infusion Stopped (10/05/17 1714)   PRN Meds: atropine, docusate, fentaNYL (SUBLIMAZE) injection, ondansetron, promethazine   Vital Signs    Vitals:   10/06/17 0830 10/06/17 0900 10/06/17 0930 10/06/17 1000  BP: 123/67 117/70 116/74 119/82  Pulse: (!) 120 (!) 118 (!) 117 (!) 113  Resp: (!) 29 (!) 26 (!) 29 (!) 29  Temp:      TempSrc:      SpO2: 100% 100% 100% 100%  Weight:      Height:        Intake/Output Summary (Last 24 hours) at 10/06/2017 1138 Last data filed at 10/06/2017 1000 Gross per 24 hour  Intake 865.59 ml  Output 1325 ml  Net -459.41 ml   Filed Weights   10/04/17 0500 10/05/17 0357 10/06/17 0455  Weight: 184 lb 1.3 oz (83.5 kg) 182 lb 12.2 oz  (82.9 kg) 180 lb 12.4 oz (82 kg)    Telemetry    Mostly sinus tachycardia with very frequent PACs, some runs of irregular rhythm that are probably atrial fibrillation versus multifocal atrial tachycardia (some coherent P waves seen) - Personally Reviewed  ECG    Probably atrial flutter with 2: A1 block, pre-existing right bundle branch block left axis deviation with broad QRS, QTC 528 ms personally Reviewed  Physical Exam  Opens eyes, otherwise not responsive GEN: No acute distress.   Neck: No JVD, tracheostomy Cardiac: RRR, no murmurs, rubs, or gallops.  Respiratory: Clear to auscultation bilaterally. GI: Soft, nontender, non-distended  MS: No edema; No deformity. Neuro:  Nonfocal  Psych: Normal affect   Labs    Chemistry Recent Labs  Lab 10/05/17 0346 10/05/17 1643 10/06/17 0444  NA 136 139 140  K 3.7 3.6 3.8  CL 103 104 105  CO2 20* 18* 20*  GLUCOSE 172* 242* 107*  BUN 115* 120* 120*  CREATININE 2.10* 2.23* 2.26*  CALCIUM 8.2* 8.5* 8.7*  GFRNONAA 28* 26* 26*  GFRAA 33* 30* 30*  ANIONGAP 13 17* 15     Hematology Recent Labs  Lab 10/02/17 0414 10/03/17 0615 10/05/17 0346  WBC 13.0*  11.0* 14.6*  RBC 3.00* 2.88* 2.82*  HGB 9.2* 8.9* 8.5*  HCT 27.6* 26.7* 25.9*  MCV 92.0 92.7 91.8  MCH 30.7 30.9 30.1  MCHC 33.3 33.3 32.8  RDW 16.6* 17.0* 17.4*  PLT 154 163 209    Cardiac EnzymesNo results for input(s): TROPONINI in the last 168 hours. No results for input(s): TROPIPOC in the last 168 hours.   BNPNo results for input(s): BNP, PROBNP in the last 168 hours.   DDimer No results for input(s): DDIMER in the last 168 hours.   Radiology    Dg Chest Port 1 View  Result Date: 10/05/2017 CLINICAL DATA:  Respiratory failure, shortness of breath. EXAM: PORTABLE CHEST 1 VIEW COMPARISON:  Portable chest x-ray of October 04, 2017 FINDINGS: The lungs are adequately inflated. The tracheostomy tube tip projects between the clavicular heads. Bibasilar atelectasis or  pneumonia is present greatest on the left. There small bilateral pleural effusions which appears stable. The heart is top-normal in size. The pulmonary vascularity is mildly engorged. The patient has undergone previous CABG. A feeding tube is present whose tip projects below the inferior margin of the image. The right internal jugular venous catheter tip projects over the midportion of the SVC. IMPRESSION: Fairly stable changes of CHF with mild interstitial edema and small bilateral pleural effusions. There may be bibasilar atelectasis greatest on the left. Electronically Signed   By: David  Martinique M.D.   On: 10/05/2017 07:11    Cardiac Studies   ECHO: 09/24/2017 - Left ventricle: The cavity size was severely dilated. Systolic function was severely reduced. The estimated ejection fraction was in the range of 20% to 25%. Diffuse hypokinesis. The study is not technically sufficient to allow evaluation of LV diastolic function. - Mitral valve: Severely calcified annulus. Moderately thickened leaflets . There was mild regurgitation. - Left atrium: The atrium was mildly dilated.  Patient Profile     83 y.o. male with a known cardiomyopathy, now with acute on chronic heart failure with severely depressed LVEF now down to approximately 20%, recurrent episodes of paroxysmal atrial tachyarrhythmia (both atrial flutter and atrial fibrillation, occasionally looks like MAT) and brief episodes of sustained ventricular tachycardia in the setting of alcohol withdrawal and respiratory failure.  Assessment & Plan    Suspect ventricular tachycardia is primarily related to hyperadrenergic state on the background of severe cardiomyopathy.  Continue beta-blocker, titrate the dose up gently to avoid further heart failure exacerbation. I think he is too sick to undergo further evaluation for possible underlying ischemic heart disease. Renal function is stable, although worse than baseline. Not on RAAS  inhibitors.  For questions or updates, please contact [facility] Please consult [email or web address] for contact info under Cardiology/STEMI.      Signed, Sanda Klein, MD  10/06/2017, 11:38 AM

## 2017-10-06 NOTE — Progress Notes (Addendum)
Inpatient Diabetes Program Recommendations  AACE/ADA: New Consensus Statement on Inpatient Glycemic Control (2015)  Target Ranges:  Prepandial:   less than 140 mg/dL      Peak postprandial:   less than 180 mg/dL (1-2 hours)      Critically ill patients:  140 - 180 mg/dL   Lab Results  Component Value Date   GLUCAP 124 (H) 10/06/2017   HGBA1C 5.7 04/23/2017    Review of Glycemic Control Results for Jarvis NewcomerWADE, Johnny [name] (MRN [id number]) as of 10/06/2017 11:37  Ref. Range 10/05/2017 23:29 10/05/2017 23:53 10/06/2017 03:19 10/06/2017 08:03  Glucose-Capillary Latest Ref Range: 65 - 99 mg/dL 33 (LL) 657100 (H) 87 846124 (H)   Diabetes history: Type 2 DM Outpatient Diabetes medications: Metformin 500 mg QD Current orders for Inpatient glycemic control: Novolog 0-15 units Q4H  Inpatient Diabetes Program Recommendations:   Noted hypoglycemic episode this AM and discontinuation of tube feeds, consider decreasing correction to Novolog 0-9 units Q4H.   Thanks, Lujean RaveLauren Aija Scarfo, MSN, RNC-OB Diabetes Coordinator [phone] (8a-5p)

## 2017-10-06 NOTE — Progress Notes (Signed)
PULMONARY / CRITICAL CARE MEDICINE   Name: Johnny [name] MRN: [id number] DOB: March 29, 1935    ADMISSION DATE:  09/25/2017 CONSULTATION DATE:  09/28/2017  REFERRING MD:  Suzi RootsMilled EDP  CHIEF COMPLAINT:  Aspiration, respiratory failure  HISTORY OF PRESENT ILLNESS:   83 y/o male with a history of alcoholism, systolic heart failure and afib presented to the ER on 6/5 with acute respiratory failure in the setting of aspiration after an alcohol binge.     SUBJECTIVE:  Yesterday evening had two runs of wide complex tachycardia Cardiology came to see patient Didn't occur again after metoprolol Had issues with nausea yesterday, hypoglycemia WBC up  VITAL SIGNS: BP 116/74   Pulse (!) 117   Temp 98.1 F (36.7 C) (Oral)   Resp (!) 29   Ht 5' 10.5" (1.791 m)   Wt 180 lb 12.4 oz (82 kg)   SpO2 100%   BMI 25.57 kg/m   HEMODYNAMICS: CVP:  [0 mmHg-11 mmHg] 0 mmHg  VENTILATOR SETTINGS: Vent Mode: PCV FiO2 (%):  [40 %] 40 % Set Rate:  [20 bmp] 20 bmp PEEP:  [5 cmH20] 5 cmH20 Plateau Pressure:  [19 cmH20-27 cmH20] 21 cmH20  INTAKE / OUTPUT: I/O last 3 completed shifts: In: 1692.4 [I.V.:747.4; NG/GT:945] Out: 3140 [Urine:3140]  PHYSICAL EXAMINATION:  General:  In bed on vent HENT: NCAT Tracheostomy in place PULM: CTA B, vent supported breathing CV: RRR, no mgr GI: BS+, soft, nontender Derm: no rash Neuro: sedated on vent   LABS:  BMET Recent Labs  Lab 10/05/17 0346 10/05/17 1643 10/06/17 0444  NA 136 139 140  K 3.7 3.6 3.8  CL 103 104 105  CO2 20* 18* 20*  BUN 115* 120* 120*  CREATININE 2.10* 2.23* 2.26*  GLUCOSE 172* 242* 107*    Electrolytes Recent Labs  Lab 09/29/17 2016 09/30/17 0433 10/01/17 0418  10/03/17 0615  10/05/17 0346 10/05/17 1643 10/06/17 0444  CALCIUM 8.0* 8.2* 7.7*   < > 8.1*   < > 8.2* 8.5* 8.7*  MG 1.8 1.9 2.0  --  1.7  --  1.6* 1.7  --   PHOS 4.2 3.7 4.0  --   --   --   --   --   --    < > = values in this interval not displayed.     CBC Recent Labs  Lab 10/02/17 0414 10/03/17 0615 10/05/17 0346  WBC 13.0* 11.0* 14.6*  HGB 9.2* 8.9* 8.5*  HCT 27.6* 26.7* 25.9*  PLT 154 163 209    Coag's No results for input(s): APTT, INR in the last 168 hours.  Sepsis Markers No results for input(s): LATICACIDVEN, PROCALCITON, O2SATVEN in the last 168 hours.  ABG No results for input(s): PHART, PCO2ART, PO2ART in the last 168 hours.  Liver Enzymes No results for input(s): AST, ALT, ALKPHOS, BILITOT, ALBUMIN in the last 168 hours.  Cardiac Enzymes No results for input(s): TROPONINI, PROBNP in the last 168 hours.  Glucose Recent Labs  Lab 10/05/17 1733 10/05/17 1935 10/05/17 2329 10/05/17 2353 10/06/17 0319 10/06/17 0803  GLUCAP 204* 155* 33* 100* 87 124*    Imaging No results found.   STUDIES:  CT head 6/05 >> atrophy, chronic small vessel ischemia Echo 6/06 >> EF 20 to 25%  CULTURES: Blood 6/05 >> negative  ANTIBIOTICS: Cefepime 6/5 > 6/10 Vancomycin 6/5 > 6/12  SIGNIFICANT EVENTS: 6/05 admit 6/13 cardiology consulted 6/14 start lasix gtt  LINES/TUBES: ETT 6/5>>>6/12 Rt IJ CVL 6/5>>> Trach 6/12>>>  DISCUSSION: 83 y/o male with aspiration pneumonia leading to acute respiratory failure with hypoxemia.  Situation complicated by chronic systolic heart failure and atrial fibrillation.  He has been slow to wean and has required a tracheostomy.    ASSESSMENT / PLAN:  PULMONARY A: Acute respiratory failure with hypoxemia Aspiration pneumonia Acute pulmonary edema Pleural effusions P:   Continue diuresis again today Full mechanical vent support VAP prevention Daily WUA/SBT  CARDIOVASCULAR A:  Chronic systolic heart failure  CAD Afib VT on 6/18 P:  Tele Cardiology following Continue coreg Continue plavix, statin, ASA Continue lasix again today > intermittent  RENAL A:   Acute on chronic kidney failure > labs essentially unchanged 6/18 P:   Diuresis with  intermittent lasix again today Monitor BMET and UOP Replace electrolytes as needed  GASTROINTESTINAL A:   Nausea/vomiting on 6/17 P:   Restart tube feeding Prn zofran LFTs and Lipase now given new nausea  HEMATOLOGIC A:   Anemia and thrombocytopenia of chronic disease (alcoholism) P:  Monitor for bleeding Transfuse PRBC for Hgb < 7gm/dL  INFECTIOUS A:   Aspiration pneumonia P:   Monitor off of antibiotics  ENDOCRINE A:   DM2   P:   Continue SSI and glargine  NEUROLOGIC A:   EtOH abuse P:   RASS goal 0 Continue multi vitamin, folate Wean off precedex    FAMILY  - Updates: none bedside   My cc time 35 minutes  Heber Bluffton, MD [facility] Pager: [phone] Cell: [phone] After 3pm or if no response, call [phone]   10/06/2017, 10:02 AM

## 2017-10-07 ENCOUNTER — Inpatient Hospital Stay (HOSPITAL_COMMUNITY): Payer: Medicare HMO

## 2017-10-07 DIAGNOSIS — I484 Atypical atrial flutter: Secondary | ICD-10-CM

## 2017-10-07 LAB — CBC WITH DIFFERENTIAL/PLATELET
Abs Immature Granulocytes: 0.2 10*3/uL — ABNORMAL HIGH (ref 0.0–0.1)
BASOS ABS: 0 10*3/uL (ref 0.0–0.1)
BASOS PCT: 0 %
EOS ABS: 0 10*3/uL (ref 0.0–0.7)
EOS PCT: 0 %
HCT: 26.1 % — ABNORMAL LOW (ref 39.0–52.0)
Hemoglobin: 8.5 g/dL — ABNORMAL LOW (ref 13.0–17.0)
Immature Granulocytes: 1 %
Lymphocytes Relative: 12 %
Lymphs Abs: 2 10*3/uL (ref 0.7–4.0)
MCH: 31 pg (ref 26.0–34.0)
MCHC: 32.6 g/dL (ref 30.0–36.0)
MCV: 95.3 fL (ref 78.0–100.0)
MONO ABS: 1.7 10*3/uL — AB (ref 0.1–1.0)
Monocytes Relative: 9 %
Neutro Abs: 13.7 10*3/uL — ABNORMAL HIGH (ref 1.7–7.7)
Neutrophils Relative %: 78 %
PLATELETS: 251 10*3/uL (ref 150–400)
RBC: 2.74 MIL/uL — ABNORMAL LOW (ref 4.22–5.81)
RDW: 17.8 % — AB (ref 11.5–15.5)
WBC: 17.6 10*3/uL — ABNORMAL HIGH (ref 4.0–10.5)

## 2017-10-07 LAB — GLUCOSE, CAPILLARY
GLUCOSE-CAPILLARY: 127 mg/dL — AB (ref 65–99)
GLUCOSE-CAPILLARY: 133 mg/dL — AB (ref 65–99)
GLUCOSE-CAPILLARY: 163 mg/dL — AB (ref 65–99)
GLUCOSE-CAPILLARY: 178 mg/dL — AB (ref 65–99)
Glucose-Capillary: 204 mg/dL — ABNORMAL HIGH (ref 65–99)
Glucose-Capillary: 192 mg/dL — ABNORMAL HIGH (ref 65–99)

## 2017-10-07 LAB — COOXEMETRY PANEL
Carboxyhemoglobin: 0.7 % (ref 0.5–1.5)
Methemoglobin: 1.6 % — ABNORMAL HIGH (ref 0.0–1.5)
O2 Saturation: 44.2 %
Total hemoglobin: 8.5 g/dL — ABNORMAL LOW (ref 12.0–16.0)

## 2017-10-07 LAB — LACTIC ACID, PLASMA: Lactic Acid, Venous: 1.3 mmol/L (ref 0.5–1.9)

## 2017-10-07 LAB — BASIC METABOLIC PANEL
Anion gap: 12 (ref 5–15)
BUN: 127 mg/dL — AB (ref 6–20)
CO2: 21 mmol/L — ABNORMAL LOW (ref 22–32)
CREATININE: 2.75 mg/dL — AB (ref 0.61–1.24)
Calcium: 8.5 mg/dL — ABNORMAL LOW (ref 8.9–10.3)
Chloride: 105 mmol/L (ref 101–111)
GFR calc Af Amer: 24 mL/min — ABNORMAL LOW (ref >60)
GFR, EST NON AFRICAN AMERICAN: 20 mL/min — AB (ref >60)
Glucose, Bld: 178 mg/dL — ABNORMAL HIGH (ref 65–99)
Potassium: 3.3 mmol/L — ABNORMAL LOW (ref 3.5–5.1)
SODIUM: 138 mmol/L (ref 135–145)

## 2017-10-07 MED ORDER — POTASSIUM CHLORIDE 20 MEQ/15ML (10%) PO SOLN
40.0000 meq | Freq: Once | ORAL | Status: AC
  Administered 2017-10-07: 40 meq via ORAL

## 2017-10-07 MED ORDER — SODIUM CHLORIDE 0.9 % IV BOLUS
500.0000 mL | Freq: Once | INTRAVENOUS | Status: AC
  Administered 2017-10-07: 500 mL via INTRAVENOUS

## 2017-10-07 MED ORDER — PRO-STAT SUGAR FREE PO LIQD
30.0000 mL | Freq: Two times a day (BID) | ORAL | Status: DC
  Administered 2017-10-07 – 2017-10-20 (×25): 30 mL
  Filled 2017-10-07 (×24): qty 30

## 2017-10-07 MED ORDER — POTASSIUM CHLORIDE 20 MEQ/15ML (10%) PO SOLN
40.0000 meq | Freq: Every day | ORAL | Status: DC
  Filled 2017-10-07: qty 30

## 2017-10-07 MED ORDER — VITAL AF 1.2 CAL PO LIQD
1000.0000 mL | ORAL | Status: DC
  Administered 2017-10-08 – 2017-10-14 (×9): 1000 mL
  Filled 2017-10-07: qty 1000

## 2017-10-07 NOTE — Progress Notes (Addendum)
Progress Note  Patient Name: Johnny [name] Date of Encounter: 10/07/2017  Primary Cardiologist: Peter Martinique, MD  Subjective   Remains on trache, awakens to sound of voice.  Inpatient Medications    Scheduled Meds: . aspirin  81 mg Per Tube Daily  . atorvastatin  20 mg Per Tube Daily  . carvedilol  3.125 mg Oral BID WC  . chlorhexidine gluconate (MEDLINE KIT)  15 mL Mouth Rinse BID  . clopidogrel  75 mg Per Tube Daily  . famotidine  20 mg Per Tube Daily  . folic acid  1 mg Per Tube Daily  . heparin  5,000 Units Subcutaneous Q8H  . insulin aspart  0-9 Units Subcutaneous Q4H  . insulin glargine  5 Units Subcutaneous QHS  . mouth rinse  15 mL Mouth Rinse 10 times per day  . multivitamin with minerals  1 tablet Per Tube Daily  . thiamine  100 mg Per Tube Daily   Continuous Infusions: . feeding supplement (VITAL AF 1.2 CAL) 50 mL/hr at 10/07/17 1100  . phenylephrine (NEO-SYNEPHRINE) Adult infusion Stopped (10/05/17 1714)   PRN Meds: atropine, docusate, fentaNYL (SUBLIMAZE) injection, ondansetron, promethazine   Vital Signs    Vitals:   10/07/17 0800 10/07/17 1000 10/07/17 1100 10/07/17 1200  BP: (!) 119/41   (!) 128/55  Pulse: (!) 101 86  95  Resp: (!) 29 (!) 32  (!) 25  Temp:   97.6 F (36.4 C)   TempSrc:      SpO2: 100% 100%  100%  Weight:      Height:        Intake/Output Summary (Last 24 hours) at 10/07/2017 1231 Last data filed at 10/07/2017 1100 Gross per 24 hour  Intake 1900.28 ml  Output 670 ml  Net 1230.28 ml   Filed Weights   10/05/17 0357 10/06/17 0455 10/07/17 0400  Weight: 182 lb 12.2 oz (82.9 kg) 180 lb 12.4 oz (82 kg) 174 lb 2.6 oz (79 kg)    Telemetry    NSR occ PACs, PVCs, MAT, currently appears afib/flutter- Personally Reviewed  Physical Exam   GEN: No acute distress but chronically ill appearing HEENT: Normocephalic, atraumatic, sclera non-icteric. Neck: No JVD or bruits. Cardiac: RRR no murmurs, rubs, or gallops.  Radials/DP/PT 1+  and equal bilaterally.  Respiratory: Coarse mechanical BS bilaterally. Breathing is unlabored. GI: Soft, nontender, non-distended, BS +x 4. MS: generalized atrophy Extremities: No clubbing or cyanosis. No edema. Distal pedal pulses are 2+ and equal bilaterally. Neuro:  Opens eyes to sound of voice but this is extent of commands Psych: unable to assess  Labs    Chemistry Recent Labs  Lab 10/05/17 1643 10/06/17 0444 10/06/17 1005 10/07/17 0507  NA 139 140  --  138  K 3.6 3.8  --  3.3*  CL 104 105  --  105  CO2 18* 20*  --  21*  GLUCOSE 242* 107*  --  178*  BUN 120* 120*  --  127*  CREATININE 2.23* 2.26*  --  2.75*  CALCIUM 8.5* 8.7*  --  8.5*  PROT  --   --  6.6  --   ALBUMIN  --   --  2.3*  --   AST  --   --  59*  --   ALT  --   --  46  --   ALKPHOS  --   --  152*  --   BILITOT  --   --  1.3*  --  GFRNONAA 26* 26*  --  20*  GFRAA 30* 30*  --  24*  ANIONGAP 17* 15  --  12     Hematology Recent Labs  Lab 10/03/17 0615 10/05/17 0346 10/07/17 0507  WBC 11.0* 14.6* 17.6*  RBC 2.88* 2.82* 2.74*  HGB 8.9* 8.5* 8.5*  HCT 26.7* 25.9* 26.1*  MCV 92.7 91.8 95.3  MCH 30.9 30.1 31.0  MCHC 33.3 32.8 32.6  RDW 17.0* 17.4* 17.8*  PLT 163 209 251    Cardiac EnzymesNo results for input(s): TROPONINI in the last 168 hours. No results for input(s): TROPIPOC in the last 168 hours.   BNPNo results for input(s): BNP, PROBNP in the last 168 hours.   DDimer No results for input(s): DDIMER in the last 168 hours.   Radiology    Dg Chest Port 1 View  Result Date: 10/07/2017 CLINICAL DATA:  Hypoxia EXAM: PORTABLE CHEST 1 VIEW COMPARISON:  October 05, 2017 FINDINGS: Tracheostomy catheter tip is 8.0 cm above the carina. Central catheter tip is in the superior vena cava near the cavoatrial junction. Feeding tube tip is below the diaphragm. No pneumothorax. There are small pleural effusions bilaterally with bibasilar atelectasis. There is cardiomegaly with pulmonary vascularity within  normal limits. Patient is status post coronary artery bypass grafting. There is aortic atherosclerosis. No adenopathy. No bone lesions. IMPRESSION: Tube and catheter positions as described without pneumothorax. Cardiomegaly with small pleural effusions bilaterally. There is bibasilar atelectasis. No frank edema or consolidation evident. There is aortic atherosclerosis. Aortic Atherosclerosis (ICD10-I70.0). Electronically Signed   By: Lowella Grip III M.D.   On: 10/07/2017 07:32    Cardiac Studies   2D echo 09/24/17 Study Conclusions  - Left ventricle: The cavity size was severely dilated. Systolic   function was severely reduced. The estimated ejection fraction   was in the range of 20% to 25%. Diffuse hypokinesis. The study is   not technically sufficient to allow evaluation of LV diastolic   function. - Mitral valve: Severely calcified annulus. Moderately thickened   leaflets . There was mild regurgitation. - Left atrium: The atrium was mildly dilated.   Patient Profile     83 y.o. male with chronic systolic CHF (prior LVEF 71%, now 20-25%), HTN, HLD, DM 2, EtOH abuse, CVA, CAD s/p CABG, PAF/aflutter s/p ablation 2009 and PEA arrest (04/2012 in the setting of withdrawal seizure). Last cath 10/2016 for NSTEMI at which time there was severe native CAD beyond the insertions of the grafts, which is not amenable to PCI, elevated troponin felt 2/2 small vessel disease and therefore Plavix was added. This admission he was admitted with acute hypoxic respiratory failure, presumed septic shock, CAP vs aspiration PNA. Admission notable for AKI, acute metabolic encephalopathy, anemia and thrombocytopenia. Ultimately he required tracheostomy and PEG placement. Cardiology is following for episode of decline in EF, low flat troponin trend felt due to demand process/CHF, episode of VT 6/17 (in setting of not receiving BB), and paroxysms of atrial fib/flutter.  Assessment & Plan    1. Complicated  illness as above, being managed by PCCM. Cr continues to rise. Remains critically ill.   2. Acute on chronic systolic CHF - diuretics now on hold. Continue beta blocker as BP allows - will ask MD to review CoOx results. No ACEI/ARB/spiro given AKI.  3. Recurrence of atrial fib/flutter as well as MAT - CHADSVASC 8. He is on ASA/Plavix due to NSTEMI 10/2016 but is not on anticoagulation. Will review consideration of anticoag with MD. Does  not seem to be ideal candidate for such with ongoing EtOH, but will ask MD for formal input. (He is concomitantly on ASA/Plavix.) HR does not seem wildly out of proportion for acute illness. Continue beta blocker.  4. VT - no recurrence in last 24 hours. Will defer lyte management to primary team.  5. CAD s/p CABG - mild trop elevation felt due to demand process. Felt too sick to undergo further eval for worsening ischemic heart disease.  PCCM notes indicate conversation with family who would like to initiate face to face conversation on 6/21 with their team. Palliative medicine consulted.  For questions or updates, please contact [place] Please consult [email or web address] for contact info under Cardiology/STEMI.  Signed, Charlie Pitter, PA-C 10/07/2017, 12:31 PM    I have seen and examined the patient along with Charlie Pitter, PA-C.  I have reviewed the chart, notes and new data.  I agree with PA/NP's note.  Key new complaints: sedated Key examination changes: irregular rhythm Key new findings / data: multiple types of atrial arrhythmia, but no new VT. Mostly in SR with extremely frequent PACs  PLAN: Would try to avoid any interruptions in his beta blocker and meticulous attention to electrolyte hemostasis. I think he would be a very poor candidate for added anticoagulation therapy. The bleeding risk outweighs stroke reduction benefit in my opinion. Continue antiplatelet therapy for recent acute coronary sd.  [facility] will sign off.   Medication  Recommendations:  carvedilol 3.125 mg BID. Titrate up if BP allows. Other recommendations (labs, testing, etc):  Frequent monitoring of K and Mg until normal food intake is reestablished Follow up as an outpatient:  Please call when approaching DC so that we can arrange   Sanda Klein, MD, [facility] [phone] 10/07/2017, 1:28 PM

## 2017-10-07 NOTE — Progress Notes (Signed)
Physical Therapy Treatment Patient Details Name: Johnny [name] MRN: [id number] DOB: 01-23-1935 Today's Date: 10/07/2017    History of Present Illness 83 yo male former smoker with respiratory failure and altered mental status after alcohol binge.  Found to have pneumonia, septic shock and intubated in ER. Tracheostomy 6/12    PT Comments    Pt willing to participate in therapy today. Utilized bed Egress position to bring pt into seated position, where he was able to perform UE and LE therapeutic exercise, and sitting without support. Pt became asymptomatically orthostatic during exercise (see General Comments) and was returned to supine position. D/c plans remain appropriate at this time.     Follow Up Recommendations  SNF     Equipment Recommendations  None recommended by PT    Recommendations for Other Services       Precautions / Restrictions Precautions Precautions: Fall Restrictions Weight Bearing Restrictions: No    Mobility  Bed Mobility Overal bed mobility: Needs Assistance Bed Mobility: Supine to Sit;Sit to Supine     Supine to sit: Max assist;+2 for physical assistance;HOB elevated     General bed mobility comments: utilized Egress function on bed and placed in sitting position, requires maxAx2 for lifting back off of bed to come to seated without support, able to hold seated position for 10 sec with min A before fatigue  Transfers                 General transfer comment: unable to attempt                  Balance Overall balance assessment: Needs assistance Sitting-balance support: Feet supported;Bilateral upper extremity supported Sitting balance-Leahy Scale: Poor Sitting balance - Comments: initially requires assist to maintain balance, sat for 3 minutes and able to perform 1 minute with min guard assit.                                    Cognition Arousal/Alertness: Awake/alert Behavior During Therapy: WFL for tasks  assessed/performed Overall Cognitive Status: Difficult to assess                                        Exercises General Exercises - Upper Extremity Shoulder Flexion: AROM;Both;5 reps Elbow Flexion: AROM;Both;10 reps Elbow Extension: AROM;Both;10 reps General Exercises - Lower Extremity Ankle Circles/Pumps: AROM;Both;10 reps Long Arc Quad: AROM;Both;10 reps Hip Flexion/Marching: AROM;Both;10 reps    General Comments General comments (skin integrity, edema, etc.): pt on full vent support, FiO2 40%O2, PEEp 5, at rest BP 85/65 with sitting 5 minutes BP 60/46, returned to supine BP 86/43       Pertinent Vitals/Pain Pain Assessment: Faces Faces Pain Scale: Hurts little more Pain Location: generalized Pain Descriptors / Indicators: Grimacing Pain Intervention(s): Limited activity within patient's tolerance;Monitored during session;Repositioned           PT Goals (current goals can now be found in the care plan section) Acute Rehab PT Goals Patient Stated Goal: none stated PT Goal Formulation: Patient unable to participate in goal setting Time For Goal Achievement: 10/19/17 Potential to Achieve Goals: Fair    Frequency    Min 2X/week      PT Plan      Co-evaluation              AM-PAC  PT "6 Clicks" Daily Activity  Outcome Measure  Difficulty turning over in bed (including adjusting bedclothes, sheets and blankets)?: Unable Difficulty moving from lying on back to sitting on the side of the bed? : Unable Difficulty sitting down on and standing up from a chair with arms (e.g., wheelchair, bedside commode, etc,.)?: Unable Help needed moving to and from a bed to chair (including a wheelchair)?: Total Help needed walking in hospital room?: Total Help needed climbing 3-5 steps with a railing? : Total 6 Click Score: 6    End of Session   Activity Tolerance: Patient limited by fatigue;Patient limited by pain Patient left: in bed;with call  bell/phone within reach;with bed alarm set;with restraints reapplied Nurse Communication: Mobility status PT Visit Diagnosis: Other abnormalities of gait and mobility (R26.89);Muscle weakness (generalized) (M62.81);Difficulty in walking, not elsewhere classified (R26.2)     Time: [id number] PT Time Calculation (min) (ACUTE ONLY): 16 min  Charges:  $Therapeutic Exercise: 8-22 mins                    G Codes:       Tryson Lumley B. Beverely RisenVan Fleet PT, DPT Acute Rehabilitation  [phone] Pager [phone]     Elon Alaslizabeth B [facility] 10/07/2017, 4:34 PM

## 2017-10-07 NOTE — Progress Notes (Signed)
Nutrition Follow-up  DOCUMENTATION CODES:   Not applicable  INTERVENTION:   Tube Feeding:  Vital AF 1.2 @ 55 ml/hr Pro-Stat 30 mL BID Providing 129 g of protein, 1784 kcals, 1069 mL of free water   NUTRITION DIAGNOSIS:   Inadequate oral intake related to inability to eat as evidenced by estimated needs.  Being addressed via TF  GOAL:   Patient will meet greater than or equal to 90% of their needs  Progressing  MONITOR:   Diet advancement, Vent status, Weight trends, Labs, I & O's, TF tolerance  REASON FOR ASSESSMENT:   Consult Enteral/tube feeding initiation and management  ASSESSMENT:   Patient with PMH significant for ETOH abuse, CAD s/p CABG, CHF, alcoholic hepatitis, PAF s/p ablation, DM, and cardiac arrest in setting of withdrawals. Presents this admission after a several day alcohol binge. Admitted for acute metabolic encephalopathy and respiratory failure requiring intubation.    Patient remains on ventilator support via trach, alert MV: 12.7 L/min Temp (24hrs), Avg:98.3 F (36.8 C), Min:97.1 F (36.2 C), Max:99.2 F (37.3 C)  Per RN, pt with +nausea yesterday. TF held for 2 hours and then re-started at 20 ml/hr. TF currently infusing at 50 ml/hr with no issues with tolerance. Cortrak in place, tip in duodenum  Noted UOP 610 mL in previous 24 hours. Creatinine trending up, BUN up as well. Noted lasix on hold  Admission weight 175 pounds; current wt 174 pounds.  Weight has fluctuated up and down since admission.  Labs: BUN 127, Creatinine 2.75, potassium 3.3, CBGs 133-204 Meds: MVI, thiamine, lantus, ss novolog, folic acid   Diet Order:   Diet Order           Diet NPO time specified  Diet effective midnight          EDUCATION NEEDS:   Not appropriate for education at this time  Skin:  Skin Assessment: Skin Integrity Issues: Skin Integrity Issues:: Stage II Stage II: buttock Other: MASD: buttock  Last BM:  6/19  Height:   Ht Readings  from Last 1 Encounters:  09/27/2017 5' 10.5" (1.791 m)    Weight:   Wt Readings from Last 1 Encounters:  10/07/17 174 lb 2.6 oz (79 kg)    Ideal Body Weight:  75.5 kg  BMI:  Body mass index is 24.64 kg/m.  Estimated Nutritional Needs:   Kcal:  1870 kcals   Protein:  120-145 g  Fluid:  >1.6 L/day   Romelle Starcherate Shyna Duignan MS, RD, LDN, CNSC [phone] Pager  [phone] Weekend/On-Call Pager

## 2017-10-07 NOTE — Progress Notes (Signed)
PULMONARY / CRITICAL CARE MEDICINE   Name: Johnny [name]: [id number] DOB: Sep 24, 1934    ADMISSION DATE:  10/18/2017 CONSULTATION DATE:  10/08/2017  REFERRING MD:  Suzi RootsMilled EDP  CHIEF COMPLAINT:  Aspiration, respiratory failure  HISTORY OF PRESENT ILLNESS:   83 y/o male with a history of alcoholism, systolic heart failure and afib presented to the ER on 6/5 with acute respiratory failure in the setting of aspiration after an alcohol binge.     SUBJECTIVE:  No further episodes of VT Secretions up Concern for vomiting overnight   VITAL SIGNS: BP (!) 119/41   Pulse 86   Temp (!) 97.1 F (36.2 C) (Axillary)   Resp (!) 32   Ht 5' 10.5" (1.791 m)   Wt 174 lb 2.6 oz (79 kg)   SpO2 100%   BMI 24.64 kg/m   HEMODYNAMICS:    VENTILATOR SETTINGS: Vent Mode: PSV;CPAP FiO2 (%):  [40 %] 40 % Set Rate:  [20 bmp] 20 bmp PEEP:  [5 cmH20] 5 cmH20 Pressure Support:  [14 cmH20] 14 cmH20 Plateau Pressure:  [18 cmH20-20 cmH20] 20 cmH20  INTAKE / OUTPUT: I/O last 3 completed shifts: In: 2104.9 [I.V.:1529.9; NG/GT:575] Out: 1410 [Urine:1410]  PHYSICAL EXAMINATION:  General:  In bed on vent HENT: NCAT tracheostomy in place PULM: CTA B, vent supported breathing CV: RRR, no mgr GI: BS+, soft, nontender MSK: normal bulk and tone Neuro: awake on vent, muscles somewhat rigid   LABS:  BMET Recent Labs  Lab 10/05/17 1643 10/06/17 0444 10/07/17 0507  NA 139 140 138  K 3.6 3.8 3.3*  CL 104 105 105  CO2 18* 20* 21*  BUN 120* 120* 127*  CREATININE 2.23* 2.26* 2.75*  GLUCOSE 242* 107* 178*    Electrolytes Recent Labs  Lab 10/01/17 0418  10/03/17 0615  10/05/17 0346 10/05/17 1643 10/06/17 0444 10/07/17 0507  CALCIUM 7.7*   < > 8.1*   < > 8.2* 8.5* 8.7* 8.5*  MG 2.0  --  1.7  --  1.6* 1.7  --   --   PHOS 4.0  --   --   --   --   --   --   --    < > = values in this interval not displayed.    CBC Recent Labs  Lab 10/03/17 0615 10/05/17 0346 10/07/17 0507  WBC 11.0*  14.6* 17.6*  HGB 8.9* 8.5* 8.5*  HCT 26.7* 25.9* 26.1*  PLT 163 209 251    Coag's No results for input(s): APTT, INR in the last 168 hours.  Sepsis Markers No results for input(s): LATICACIDVEN, PROCALCITON, O2SATVEN in the last 168 hours.  ABG No results for input(s): PHART, PCO2ART, PO2ART in the last 168 hours.  Liver Enzymes Recent Labs  Lab 10/06/17 1005  AST 59*  ALT 46  ALKPHOS 152*  BILITOT 1.3*  ALBUMIN 2.3*    Cardiac Enzymes No results for input(s): TROPONINI, PROBNP in the last 168 hours.  Glucose Recent Labs  Lab 10/06/17 1138 10/06/17 1553 10/06/17 1929 10/06/17 2348 10/07/17 0327 10/07/17 0752  GLUCAP 112* 167* 176* 161* 133* 178*    Imaging Dg Chest Port 1 View  Result Date: 10/07/2017 CLINICAL DATA:  Hypoxia EXAM: PORTABLE CHEST 1 VIEW COMPARISON:  October 05, 2017 FINDINGS: Tracheostomy catheter tip is 8.0 cm above the carina. Central catheter tip is in the superior vena cava near the cavoatrial junction. Feeding tube tip is below the diaphragm. No pneumothorax. There are small pleural effusions bilaterally with  bibasilar atelectasis. There is cardiomegaly with pulmonary vascularity within normal limits. Patient is status post coronary artery bypass grafting. There is aortic atherosclerosis. No adenopathy. No bone lesions. IMPRESSION: Tube and catheter positions as described without pneumothorax. Cardiomegaly with small pleural effusions bilaterally. There is bibasilar atelectasis. No frank edema or consolidation evident. There is aortic atherosclerosis. Aortic Atherosclerosis (ICD10-I70.0). Electronically Signed   By: Bretta Bang III M.D.   On: 10/07/2017 07:32     STUDIES:  CT head 6/05 >> atrophy, chronic small vessel ischemia Echo 6/06 >> EF 20 to 25%  CULTURES: Blood 6/05 >> negative  ANTIBIOTICS: Cefepime 6/5 > 6/10 Vancomycin 6/5 > 6/12  SIGNIFICANT EVENTS: 6/05 admit 6/13 cardiology consulted 6/14 start lasix  gtt  LINES/TUBES: ETT 6/5>>>6/12 Rt IJ CVL 6/5>>> Trach 6/12>>>    DISCUSSION: 83 y/o male with aspiration pneumonia leading to acute respiratory failure with hypoxemia.  Situation complicated by chronic systolic heart failure and atrial fibrillation.  He has been slow to wean and has required a tracheostomy.  Overall prognosis is poor.   ASSESSMENT / PLAN:  PULMONARY A: Acute respiratory failure with hypoxemia> very slow progress Aspiration pneumonia Acute pulmonary edema > about the same Pleural effusions P:   Pressure support as long as tolerated Full mechanical vent support VAP prevention Daily WUA/SBT Hold diuresis  CARDIOVASCULAR A:  Chronic systolic heart failure  CAD Afib VT on 6/18 P:  Tele Continue coreg, plavix, statin, ASA Hold lasix Check coox (at risk for cardiogenic shock)  RENAL A:   Acute on chronic kidney failure > worse, pre-renal 6/19 P:   Hold lasix Consider fluid bolus Monitor BMET and UOP Replace electrolytes as needed  GASTROINTESTINAL A:   Nausea/vomiting on 6/18 PM P:   Continue tube feeding Continue medical stress ulcer prophylaxis zofran prn   HEMATOLOGIC A:   Anemia and thrombocytopenia of chronic disease (alcoholism) P:  Monitor for bleeding Transfuse PRBC for Hgb < 7 gm/dL  INFECTIOUS A:   Aspiration pneumonia P:   Monitor for fever  ENDOCRINE A:   DM2   P:   Continue SSI and glargine Stop D10  NEUROLOGIC A:   EtOH abuse P:   RASS goal 0 Continue MVI, folate Wean off precedex   FAMILY  - Updates: I called Suprena this morning and explained that I think his overall prognosis is terrible considering his multiple comorbid illnesses (systolic heart failure, alcoholism, baseline poor functional status) and slow progress with worsening renal failure.  She voiced understanding.  She said she would like to have a face to face conversation when she is available on Friday 6/21.  My cc time 33  minutes  Move to [facility] service  Will consult palliative medicine  [name], MD [facility] Pager: [phone] Cell: [phone] After 3pm or if no response, call [phone]   10/07/2017, 10:30 AM

## 2017-10-07 NOTE — Progress Notes (Signed)
[facility] notified of k+3.3. No new orders at this time. Will continue to monitor.

## 2017-10-08 ENCOUNTER — Inpatient Hospital Stay (HOSPITAL_COMMUNITY): Payer: Medicare HMO

## 2017-10-08 DIAGNOSIS — L89892 Pressure ulcer of other site, stage 2: Secondary | ICD-10-CM

## 2017-10-08 DIAGNOSIS — L8990 Pressure ulcer of unspecified site, unspecified stage: Secondary | ICD-10-CM

## 2017-10-08 LAB — CBC WITH DIFFERENTIAL/PLATELET
ABS IMMATURE GRANULOCYTES: 0.2 10*3/uL — AB (ref 0.0–0.1)
Basophils Absolute: 0 10*3/uL (ref 0.0–0.1)
Basophils Relative: 0 %
Eosinophils Absolute: 0 10*3/uL (ref 0.0–0.7)
Eosinophils Relative: 0 %
HCT: 27.9 % — ABNORMAL LOW (ref 39.0–52.0)
HEMOGLOBIN: 8.7 g/dL — AB (ref 13.0–17.0)
Immature Granulocytes: 2 %
LYMPHS PCT: 13 %
Lymphs Abs: 1.8 10*3/uL (ref 0.7–4.0)
MCH: 30.4 pg (ref 26.0–34.0)
MCHC: 31.2 g/dL (ref 30.0–36.0)
MCV: 97.6 fL (ref 78.0–100.0)
MONOS PCT: 11 %
Monocytes Absolute: 1.4 10*3/uL — ABNORMAL HIGH (ref 0.1–1.0)
NEUTROS ABS: 10.1 10*3/uL — AB (ref 1.7–7.7)
Neutrophils Relative %: 74 %
Platelets: 264 10*3/uL (ref 150–400)
RBC: 2.86 MIL/uL — ABNORMAL LOW (ref 4.22–5.81)
RDW: 18 % — ABNORMAL HIGH (ref 11.5–15.5)
WBC: 13.6 10*3/uL — ABNORMAL HIGH (ref 4.0–10.5)

## 2017-10-08 LAB — GLUCOSE, CAPILLARY
GLUCOSE-CAPILLARY: 157 mg/dL — AB (ref 65–99)
Glucose-Capillary: 144 mg/dL — ABNORMAL HIGH (ref 65–99)
Glucose-Capillary: 191 mg/dL — ABNORMAL HIGH (ref 65–99)
Glucose-Capillary: 195 mg/dL — ABNORMAL HIGH (ref 65–99)
Glucose-Capillary: 115 mg/dL — ABNORMAL HIGH (ref 65–99)

## 2017-10-08 LAB — BASIC METABOLIC PANEL
ANION GAP: 13 (ref 5–15)
BUN: 141 mg/dL — AB (ref 6–20)
CALCIUM: 8.5 mg/dL — AB (ref 8.9–10.3)
CO2: 21 mmol/L — AB (ref 22–32)
Chloride: 108 mmol/L (ref 101–111)
Creatinine, Ser: 2.89 mg/dL — ABNORMAL HIGH (ref 0.61–1.24)
GFR calc Af Amer: 22 mL/min — ABNORMAL LOW (ref >60)
GFR calc non Af Amer: 19 mL/min — ABNORMAL LOW (ref >60)
GLUCOSE: 163 mg/dL — AB (ref 65–99)
Potassium: 3.3 mmol/L — ABNORMAL LOW (ref 3.5–5.1)
Sodium: 142 mmol/L (ref 135–145)

## 2017-10-08 MED ORDER — MAGNESIUM SULFATE 2 GM/50ML IV SOLN
2.0000 g | Freq: Once | INTRAVENOUS | Status: AC
  Administered 2017-10-08: 2 g via INTRAVENOUS
  Filled 2017-10-08: qty 50

## 2017-10-08 MED ORDER — CARVEDILOL 6.25 MG PO TABS
6.2500 mg | ORAL_TABLET | Freq: Two times a day (BID) | ORAL | Status: DC
  Administered 2017-10-08 – 2017-10-18 (×21): 6.25 mg via ORAL
  Filled 2017-10-08 (×22): qty 1

## 2017-10-08 MED ORDER — WHITE PETROLATUM EX OINT
TOPICAL_OINTMENT | CUTANEOUS | Status: AC
  Filled 2017-10-08: qty 28.35

## 2017-10-08 MED ORDER — POTASSIUM CHLORIDE 20 MEQ/15ML (10%) PO SOLN
40.0000 meq | Freq: Two times a day (BID) | ORAL | Status: DC
  Administered 2017-10-08: 40 meq via ORAL
  Filled 2017-10-08: qty 30

## 2017-10-08 MED ORDER — POTASSIUM CHLORIDE 20 MEQ/15ML (10%) PO SOLN
40.0000 meq | Freq: Once | ORAL | Status: AC
  Administered 2017-10-08: 40 meq via ORAL
  Filled 2017-10-08: qty 30

## 2017-10-08 MED ORDER — WHITE PETROLATUM EX OINT
TOPICAL_OINTMENT | CUTANEOUS | Status: AC | PRN
  Administered 2017-10-08 – 2017-10-09 (×2): 0.2 via TOPICAL
  Filled 2017-10-08 (×2): qty 28.35

## 2017-10-08 MED ORDER — CHLORHEXIDINE GLUCONATE CLOTH 2 % EX PADS
6.0000 | MEDICATED_PAD | Freq: Every day | CUTANEOUS | Status: DC
  Administered 2017-10-08 – 2017-10-10 (×3): 6 via TOPICAL

## 2017-10-08 MED ORDER — POTASSIUM CHLORIDE CRYS ER 20 MEQ PO TBCR
40.0000 meq | EXTENDED_RELEASE_TABLET | Freq: Once | ORAL | Status: DC
  Filled 2017-10-08: qty 2

## 2017-10-08 NOTE — Progress Notes (Signed)
Palliative Medicine RN Note: Consult order rec'd. Meeting set for tomorrow at 1330. [place] aware.  [name], RN, BSN, [facility] Palliative Medicine Team 10/08/2017 9:36 AM Office [phone]

## 2017-10-08 NOTE — Progress Notes (Signed)
RT came to room to remove trach sutures and perform trach care. Trach sutures have been removed by wound care team. Patient has a wound under trach that wound care assessed and they applied a protective dressing on the wound. Vitals are stable. RT will continue to monitor.

## 2017-10-08 NOTE — Progress Notes (Addendum)
PROGRESS NOTE    Johnny [name]  FVC:[id number] DOB: [birth year] DOA: 09/25/2017 PCP: Marletta Lor, MD      Brief Narrative:  [name] is a 83 y.o. M with CHF EF 40%, isch CM s/p CABG in 2003, NIDDM, HTN, alcoholism and parox atrial fibrillation not on warfarin but s/p Ablation in 2009 who presented with septic shock presumed respiratory source, aspiration vs CAP.  Per admission H&P, he is now back to being mostly wheelchair bound, had been in the midst of a several day alcohol binge again when he started coughin, then on the day of admission, developed sudden severe respiratory distress.  When he arrived to ED he was desaturating to 50%, required intubation promptly, then started on pressors and admitted to ICU.       Assessment & Plan:  Acute hypoxic respiratory failure Multifocal community acquired pneumonia versus aspiration pneumonia Pleural effusions Completed 8 days cefepime, 6 days vancomycin.  No further fever.  Pleural effusions are small, bilateral.  Has been slow to wean from ventilator.  Per RT, tolerated ~14 hours on 14/4 PS yesterday, about 8 hours today.   -Continue full mechanical ventilation, assist control at night -Continue daily pressure support trials -Consult to Palliative Care -Continue restraints -Continue famotidine  Goals of care Renal function worsening, but anemia stable, thrombocytopenia resolved, CHF appears euvolemic, and tolerated last two days of pressure support well.  Also, he appears alert to me, follows most commands.  Family meeting planned for tomorrow with Palliative Care.  I suspect, given his mental status, family will be compelled to continue current cares and attempts to wean his ventilator.     Acute on chronic systolic CHF EF previously 40%, last cath 2018, no targets.  Now EF 20-25%.   -Hold Lasix -Daily weights -Increase carvedilol -Keep K>4, mag>2, supplement both today -Call Cardiology at d/c for discharge follow up with Dr.  [place]  Acute on chronic kidney failure stage III Baseline Cr 1.2-1.3.  Admitted with Cr 1.7, improved to baseline, then since then has been worsening daily.  Diuresis has been stopped. -Daily BMP  Recurrence of atrial fibrillation Multifocal atrial tachycardia CHADS2Vasc 8, but not candidate for anticoagulation in setting of active alcohol abuse.   -Continue BB  Ventricular tachycardia Has not recurred. -Electrolytes daily -Continue BB  Coronary disease Ischemic cardiomyopathy Hypertension Elevated troponin Troponin represents demand NSTEMI, not acute coronary syndrome.  No ischemic work up planned. -Continue aspirin, statin, BB, Plavix  Diabetes -Continue Lantus -Continue SSI  Chronic normocytic anemia Stable  Thrombocytopenia This has resolved.  Alcoholism His cardiology notes suggest that he was abstinent from alcohol from 2017-2019.  Admission note suggests that he had relapsed to some extent.  he has had withdrawal seizures in the past, no mention of DTs.  He is now out of the window for withdrawals.  Precedex has been stopped.  He is not at risk for relapse, in his current dependent state. -Continue folate and thiamine  Nutrition Enteral feeding tube in place. -Continue tube feeds -Consult to nutrition  Neck wound From trach. -Consult to [place]       DVT prophylaxis: Heparin Code Status: FULL Family Communication: None present MDM and disposition Plan: The below labs and imaging reports were reviewed and summarized above.    The patient was admitted with septic shock from CAP vs aspiration pneumonia.  Treated with antibiotics, slow to resolve respiratory status.  CHF    Consultants:   CCM  Cardiology  WOC  Procedures:  Echo 6/6 LV EF: 20% -   25%  ------------------------------------------------------------------- Indications:      Shock unspecified 785.50.  ------------------------------------------------------------------- History:    PMH:  Hypotension.  ------------------------------------------------------------------- Study Conclusions  - Left ventricle: The cavity size was severely dilated. Systolic   function was severely reduced. The estimated ejection fraction   was in the range of 20% to 25%. Diffuse hypokinesis. The study is   not technically sufficient to allow evaluation of LV diastolic   function. - Mitral valve: Severely calcified annulus. Moderately thickened   leaflets . There was mild regurgitation. - Left atrium: The atrium was mildly dilated.   Right IJ central line 6/5 >>  Intubated 6/5  Trach 6/12   Antimicrobials:   Vancomycin 6/5 >> 6/10  Cefepime 6/5 >> 6/12    Cultures:   6/5 blood cultures x2: NGTD    Subjective: Intubated.  Alert to me.  Follows commands.  Able to tolerate pressure support today, per RT.  Objective: Vitals:   10/08/17 1535 10/08/17 1600 10/08/17 1700 10/08/17 1736  BP:  100/70 (!) 117/92 (!) 117/92  Pulse:  95 96 (!) 102  Resp:  (!) 22 18   Temp:      TempSrc:      SpO2: 100% 100% 100%   Weight:      Height:        Intake/Output Summary (Last 24 hours) at 10/08/2017 1750 Last data filed at 10/08/2017 1706 Gross per 24 hour  Intake 1632.83 ml  Output 875 ml  Net 757.83 ml   Filed Weights   10/06/17 0455 10/07/17 0400 10/08/17 0301  Weight: 82 kg (180 lb 12.4 oz) 79 kg (174 lb 2.6 oz) 81.6 kg (179 lb 14.3 oz)    Examination: General appearance: thin adult male, alert and in no acute distress.  On vent, trached. HEENT: Anicteric, conjunctiva watery, lids and lashes normal. No nasal deformity, discharge, epistaxis.  Lips dry, edentulous.  OP moist, no oral lesions.   Skin: Warm and dry.  Wound under trach cuff not visualized. Cardiac: Regular, nl S1-S2, no murmurs appreciated.  Capillary refill is brisk.  JVP not visible.  No LE edema.  Radial pulses 2+ and symmetric. Respiratory: Tachypneic on vent.  Air movemetn bilaterally, coarse.   No  wheezing. Abdomen: Abdomen soft.  No TTP or grimace. No ascites, distension, hepatosplenomegaly.   MSK: No deformities or effusions of the large joints of the upper or lower extremities bilaterally. Neuro: Awake and alert. PERRL, EOMI, in restraints, but moves mouth as if to speak, makes eye contact, gives thumbs up to command, shakes head yes and no, opens mouth to command.  Psych: Unable to assess.    Data Reviewed: I have personally reviewed following labs and imaging studies:  CBC: Recent Labs  Lab 10/02/17 0414 10/03/17 0615 10/05/17 0346 10/07/17 0507 10/08/17 0333  WBC 13.0* 11.0* 14.6* 17.6* 13.6*  NEUTROABS  --   --   --  13.7* 10.1*  HGB 9.2* 8.9* 8.5* 8.5* 8.7*  HCT 27.6* 26.7* 25.9* 26.1* 27.9*  MCV 92.0 92.7 91.8 95.3 97.6  PLT 154 163 209 251 893   Basic Metabolic Panel: Recent Labs  Lab 10/03/17 0615  10/05/17 0346 10/05/17 1643 10/06/17 0444 10/07/17 0507 10/08/17 0333  NA 133*   < > 136 139 140 138 142  K 3.8   < > 3.7 3.6 3.8 3.3* 3.3*  CL 102   < > 103 104 105 105 108  CO2 21*   < >  20* 18* 20* 21* 21*  GLUCOSE 213*   < > 172* 242* 107* 178* 163*  BUN 91*   < > 115* 120* 120* 127* 141*  CREATININE 1.63*   < > 2.10* 2.23* 2.26* 2.75* 2.89*  CALCIUM 8.1*   < > 8.2* 8.5* 8.7* 8.5* 8.5*  MG 1.7  --  1.6* 1.7  --   --   --    < > = values in this interval not displayed.   GFR: Estimated Creatinine Clearance: 21.4 mL/min (A) (by C-G formula based on SCr of 2.89 mg/dL (H)). Liver Function Tests: Recent Labs  Lab 10/06/17 1005  AST 59*  ALT 46  ALKPHOS 152*  BILITOT 1.3*  PROT 6.6  ALBUMIN 2.3*   Recent Labs  Lab 10/06/17 1005  LIPASE 25   No results for input(s): AMMONIA in the last 168 hours. Coagulation Profile: No results for input(s): INR, PROTIME in the last 168 hours. Cardiac Enzymes: No results for input(s): CKTOTAL, CKMB, CKMBINDEX, TROPONINI in the last 168 hours. BNP (last 3 results) No results for input(s): PROBNP in the last  8760 hours. HbA1C: No results for input(s): HGBA1C in the last 72 hours. CBG: Recent Labs  Lab 10/07/17 2331 10/08/17 0354 10/08/17 0804 10/08/17 1208 10/08/17 1610  GLUCAP 127* 144* 191* 195* 115*   Lipid Profile: No results for input(s): CHOL, HDL, LDLCALC, TRIG, CHOLHDL, LDLDIRECT in the last 72 hours. Thyroid Function Tests: No results for input(s): TSH, T4TOTAL, FREET4, T3FREE, THYROIDAB in the last 72 hours. Anemia Panel: No results for input(s): VITAMINB12, FOLATE, FERRITIN, TIBC, IRON, RETICCTPCT in the last 72 hours. Urine analysis:    Component Value Date/Time   COLORURINE AMBER (A) 09/24/2017 1615   APPEARANCEUR HAZY (A) 09/19/2017 1615   LABSPEC 1.017 09/19/2017 1615   PHURINE 5.0 09/22/2017 1615   GLUCOSEU NEGATIVE 09/19/2017 1615   HGBUR MODERATE (A) 10/01/2017 1615   BILIRUBINUR NEGATIVE 09/26/2017 1615   KETONESUR NEGATIVE 10/17/2017 1615   PROTEINUR 100 (A) 09/26/2017 1615   UROBILINOGEN 1.0 04/02/2012 0204   NITRITE NEGATIVE 09/20/2017 1615   LEUKOCYTESUR NEGATIVE 10/04/2017 1615   Sepsis Labs: _0 (procalcitonin:4,lacticacidven:4)  )No results found for this or any previous visit (from the past 240 hour(s)).       Radiology Studies: Dg Chest Port 1 View  Result Date: 10/08/2017 CLINICAL DATA:  Acute respiratory failure EXAM: PORTABLE CHEST 1 VIEW COMPARISON:  10/07/2017 FINDINGS: Tracheostomy tube, feeding catheter and right jugular central line are again seen and stable. Cardiac shadow is stable. Postsurgical changes are again seen. Small bilateral pleural effusions are noted left greater than right. Mild left basilar atelectasis is noted as well. IMPRESSION: No significant interval change from the prior exam. Small bilateral pleural effusions are again seen. Electronically Signed   By: Inez Catalina M.D.   On: 10/08/2017 08:29   Dg Chest Port 1 View  Result Date: 10/07/2017 CLINICAL DATA:  Hypoxia EXAM: PORTABLE CHEST 1 VIEW COMPARISON:   October 05, 2017 FINDINGS: Tracheostomy catheter tip is 8.0 cm above the carina. Central catheter tip is in the superior vena cava near the cavoatrial junction. Feeding tube tip is below the diaphragm. No pneumothorax. There are small pleural effusions bilaterally with bibasilar atelectasis. There is cardiomegaly with pulmonary vascularity within normal limits. Patient is status post coronary artery bypass grafting. There is aortic atherosclerosis. No adenopathy. No bone lesions. IMPRESSION: Tube and catheter positions as described without pneumothorax. Cardiomegaly with small pleural effusions bilaterally. There is bibasilar atelectasis. No frank  edema or consolidation evident. There is aortic atherosclerosis. Aortic Atherosclerosis (ICD10-I70.0). Electronically Signed   By: Lowella Grip III M.D.   On: 10/07/2017 07:32        Scheduled Meds: . aspirin  81 mg Per Tube Daily  . atorvastatin  20 mg Per Tube Daily  . carvedilol  6.25 mg Oral BID WC  . chlorhexidine gluconate (MEDLINE KIT)  15 mL Mouth Rinse BID  . Chlorhexidine Gluconate Cloth  6 each Topical Q0600  . clopidogrel  75 mg Per Tube Daily  . famotidine  20 mg Per Tube Daily  . feeding supplement (PRO-STAT SUGAR FREE 64)  30 mL Per Tube BID  . folic acid  1 mg Per Tube Daily  . heparin  5,000 Units Subcutaneous Q8H  . insulin aspart  0-9 Units Subcutaneous Q4H  . insulin glargine  5 Units Subcutaneous QHS  . mouth rinse  15 mL Mouth Rinse 10 times per day  . multivitamin with minerals  1 tablet Per Tube Daily  . potassium chloride  40 mEq Oral BID  . thiamine  100 mg Per Tube Daily   Continuous Infusions: . feeding supplement (VITAL AF 1.2 CAL) 1,000 mL (10/08/17 0800)  . magnesium sulfate 1 - 4 g bolus IVPB 2 g (10/08/17 1706)     LOS: 15 days    The patient is critically ill with multiple organ failure.  Requiring ongoing mechanical ventilation without management of which he would die.   Total critical care time: 40  minutes Critical care time was exclusive of separately billable procedures and treating other patients.  Critical care was time spent personally by me on the following activities: development of treatment plan with patient and/or surrogate as well as nursing, discussions with consultants, evaluation of patient's response to treatment, examination of patient, obtaining history from patient or surrogate, ordering and performing treatments and interventions, ordering and review of laboratory studies, ordering and review of radiographic studies, pulse oximetry and re-evaluation of patient's condition.     Edwin Dada, MD [facility] 10/08/2017, 5:50 PM     Pager [phone] --- please page though AMION:  [email or web address] If 7PM-7AM, please contact night-coverage

## 2017-10-08 NOTE — Consult Note (Signed)
WOC Nurse wound consult note Reason for Consult:  Trach and forehead Wound type: Unstageable pressure injury; forehead x 2 MDRPI from O2 sensors Stage 2 pressure injury; forehead x 2 MDRPI from O2 sensors  Stage 3 pressure injury; right lateral neck/clavical  Pressure Injury POA:No Measurement: Trach: 0.5cm x 2.0cm x 0.2cm  Forehead ulcerations each aprox. 0.2cm x 0.2cm x 0cm  Wound bed: Trach: full thickness, pale pink wound bed Forehead: 2 sites have 100% eschar/2 sites are partial thickness with 100% pink base.  Drainage (amount, consistency, odor) none It is hard to determine drainage from the trach wound because the patient has such thick and copious secretions both orally and from the tracheostomy Periwound: intact at each site Dressing procedure/placement/frequency: Orders obtained from Dr. Kendrick FriesMcQuaid to remove the sutures, he is POD#8 Removed sutures Silicone foam placed under trach face plate to protect site Silicone foam in place for forehead sites. Will add Vaseline to soften eschar.   WOC Nurse team will follow along with you for weekly wound assessments.  Please notify me of any acute changes in the wounds or any new areas of concerns [facility] MSN, RN,CWOCN, CNS, CWON-AP [phone]

## 2017-10-08 NOTE — Care Management (Addendum)
Case manager contacted Irving Burtonmily with [facility] to see if patient would be appropriate for their LTACH. Emily stays that after review, patient would be appropriate. CM spoke with Dr. Maryfrances Bunnellanford   [facility] We will discuss further after family meeting on 10/09/17. Case manager will continue to monitor.

## 2017-10-09 DIAGNOSIS — R52 Pain, unspecified: Secondary | ICD-10-CM

## 2017-10-09 DIAGNOSIS — Z515 Encounter for palliative care: Secondary | ICD-10-CM

## 2017-10-09 LAB — GLUCOSE, CAPILLARY
GLUCOSE-CAPILLARY: 115 mg/dL — AB (ref 65–99)
GLUCOSE-CAPILLARY: 97 mg/dL (ref 65–99)
Glucose-Capillary: 136 mg/dL — ABNORMAL HIGH (ref 65–99)
Glucose-Capillary: 195 mg/dL — ABNORMAL HIGH (ref 65–99)
Glucose-Capillary: 199 mg/dL — ABNORMAL HIGH (ref 65–99)
Glucose-Capillary: 199 mg/dL — ABNORMAL HIGH (ref 65–99)
Glucose-Capillary: 199 mg/dL — ABNORMAL HIGH (ref 65–99)

## 2017-10-09 LAB — BASIC METABOLIC PANEL
ANION GAP: 11 (ref 5–15)
BUN: 159 mg/dL — ABNORMAL HIGH (ref 6–20)
CALCIUM: 8.2 mg/dL — AB (ref 8.9–10.3)
CO2: 20 mmol/L — ABNORMAL LOW (ref 22–32)
Chloride: 112 mmol/L — ABNORMAL HIGH (ref 101–111)
Creatinine, Ser: 2.95 mg/dL — ABNORMAL HIGH (ref 0.61–1.24)
GFR calc Af Amer: 22 mL/min — ABNORMAL LOW (ref >60)
GFR, EST NON AFRICAN AMERICAN: 19 mL/min — AB (ref >60)
GLUCOSE: 242 mg/dL — AB (ref 65–99)
Potassium: 4 mmol/L (ref 3.5–5.1)
Sodium: 143 mmol/L (ref 135–145)

## 2017-10-09 LAB — COOXEMETRY PANEL
Carboxyhemoglobin: 1.5 % (ref 0.5–1.5)
METHEMOGLOBIN: 0.8 % (ref 0.0–1.5)
O2 Saturation: 86.5 %
Total hemoglobin: 8.8 g/dL — ABNORMAL LOW (ref 12.0–16.0)

## 2017-10-09 LAB — CBC
HCT: 28.2 % — ABNORMAL LOW (ref 39.0–52.0)
Hemoglobin: 8.7 g/dL — ABNORMAL LOW (ref 13.0–17.0)
MCH: 30.4 pg (ref 26.0–34.0)
MCHC: 30.9 g/dL (ref 30.0–36.0)
MCV: 98.6 fL (ref 78.0–100.0)
Platelets: 256 10*3/uL (ref 150–400)
RBC: 2.86 MIL/uL — ABNORMAL LOW (ref 4.22–5.81)
RDW: 18 % — AB (ref 11.5–15.5)
WBC: 13.9 10*3/uL — ABNORMAL HIGH (ref 4.0–10.5)

## 2017-10-09 LAB — MAGNESIUM: MAGNESIUM: 2.9 mg/dL — AB (ref 1.7–2.4)

## 2017-10-09 MED ORDER — FENTANYL CITRATE (PF) 100 MCG/2ML IJ SOLN
50.0000 ug | INTRAMUSCULAR | Status: DC | PRN
  Administered 2017-10-09 – 2017-10-12 (×13): 50 ug via INTRAVENOUS
  Filled 2017-10-09 (×13): qty 2

## 2017-10-09 MED ORDER — SODIUM CHLORIDE 0.9 % IV SOLN
INTRAVENOUS | Status: DC
  Administered 2017-10-09 – 2017-10-16 (×3): via INTRAVENOUS

## 2017-10-09 MED ORDER — LORAZEPAM 1 MG PO TABS
1.0000 mg | ORAL_TABLET | Freq: Four times a day (QID) | ORAL | Status: DC | PRN
  Administered 2017-10-09 – 2017-10-25 (×16): 1 mg
  Filled 2017-10-09 (×16): qty 1

## 2017-10-09 NOTE — Progress Notes (Signed)
PULMONARY / CRITICAL CARE MEDICINE   Name: Johnny NewcomerDan E [name] MRN: [id number] DOB: July 13, 1934    ADMISSION DATE:  10/12/2017 CONSULTATION DATE:  09/25/2017  REFERRING MD:  Suzi RootsMilled EDP  CHIEF COMPLAINT:  Aspiration, respiratory failure  HISTORY OF PRESENT ILLNESS:   83 y/o male with a history of alcoholism, systolic heart failure and afib presented to the ER on 6/5 with acute respiratory failure in the setting of aspiration after an alcohol binge.     SUBJECTIVE:  No acute events   VITAL SIGNS: BP 101/74   Pulse 75   Temp 98.5 F (36.9 C) (Oral)   Resp (!) 21   Ht 5' 10.5" (1.791 m)   Wt 80.5 kg (177 lb 7.5 oz)   SpO2 100%   BMI 25.10 kg/m   HEMODYNAMICS:    VENTILATOR SETTINGS: Vent Mode: PCV FiO2 (%):  [40 %] 40 % Set Rate:  [20 bmp] 20 bmp PEEP:  [5 cmH20] 5 cmH20 Pressure Support:  [14 cmH20] 14 cmH20 Plateau Pressure:  [21 cmH20-23 cmH20] 21 cmH20  INTAKE / OUTPUT: I/O last 3 completed shifts: In: 2114.4 [NG/GT:2070; IV Piggyback:44.4] Out: 1730 [Urine:1030; Stool:700]  PHYSICAL EXAMINATION:  General:  Chronically ill appearing in bed on vent HENT: NCAT Trach in place PULM: CTA B, vent supported breathing CV: RRR, no mgr GI: PEG in place, soft, nontender MSK: normal bulk and tone Neuro: awake, nods head appropriately   LABS:  BMET Recent Labs  Lab 10/07/17 0507 10/08/17 0333 10/09/17 0313  NA 138 142 143  K 3.3* 3.3* 4.0  CL 105 108 112*  CO2 21* 21* 20*  BUN 127* 141* 159*  CREATININE 2.75* 2.89* 2.95*  GLUCOSE 178* 163* 242*    Electrolytes Recent Labs  Lab 10/05/17 0346 10/05/17 1643  10/07/17 0507 10/08/17 0333 10/09/17 0313  CALCIUM 8.2* 8.5*   < > 8.5* 8.5* 8.2*  MG 1.6* 1.7  --   --   --  2.9*   < > = values in this interval not displayed.    CBC Recent Labs  Lab 10/07/17 0507 10/08/17 0333 10/09/17 0313  WBC 17.6* 13.6* 13.9*  HGB 8.5* 8.7* 8.7*  HCT 26.1* 27.9* 28.2*  PLT 251 264 256    Coag's No results for input(s):  APTT, INR in the last 168 hours.  Sepsis Markers Recent Labs  Lab 10/07/17 1100  LATICACIDVEN 1.3    ABG No results for input(s): PHART, PCO2ART, PO2ART in the last 168 hours.  Liver Enzymes Recent Labs  Lab 10/06/17 1005  AST 59*  ALT 46  ALKPHOS 152*  BILITOT 1.3*  ALBUMIN 2.3*    Cardiac Enzymes No results for input(s): TROPONINI, PROBNP in the last 168 hours.  Glucose Recent Labs  Lab 10/08/17 1610 10/08/17 1940 10/09/17 0010 10/09/17 0337 10/09/17 0750 10/09/17 1138  GLUCAP 115* 157* 199* 199* 199* 195*    Imaging No results found.   STUDIES:  CT head 6/05 >> atrophy, chronic small vessel ischemia Echo 6/06 >> EF 20 to 25%  CULTURES: Blood 6/05 >> negative  ANTIBIOTICS: Cefepime 6/5 > 6/10 Vancomycin 6/5 > 6/12  SIGNIFICANT EVENTS: 6/05 admit 6/13 cardiology consulted 6/14 start lasix gtt  LINES/TUBES: ETT 6/5>>>6/12 Rt IJ CVL 6/5>>> Trach 6/12>>>    DISCUSSION: 83 y/o male with aspiration pneumonia leading to acute respiratory failure with hypoxemia.  Situation complicated by chronic systolic heart failure and atrial fibrillation.  He has been slow to wean and has required a tracheostomy.  Overall prognosis  is poor.   ASSESSMENT / PLAN:  PULMONARY A: Acute respiratory failure with hypoxemia> very slow progress Aspiration pneumonia Acute pulmonary edema > about the same Pleural effusions P:   Has not weaned in last 24 hours Full mechanical vent support VAP prevention Daily WUA/SBT Hold diuresis  CARDIOVASCULAR A:  Chronic systolic heart failure > coox on 6/19 suggested cardiogenic shock CAD Afib VT on 6/18 P:  Tele Continue coreg, plavix, statin, ASA Repeat coox Continue to hold lasix  RENAL A:   Worsening acute renal failure P:   Continue to hold lasix Continue fluid bolus Monitor BMET and UOP Replace electrolytes as needed Not a good dialysis candidate  Discussion over goals of care: Mr. Weightman has  ventilator dependent respiratory failure which has been persistent for over two weeks, severe systolic heart failure and acute kidney failure which is worsening.  His overall prognosis is poor.  The likelihood that he would do well in an LTAC type environment is poor.  I think the best approach is to consider comfort measures, but need to discuss with family.    FAMILY  - Updates: Plan to meet with family today and palliative medicine  Heber Geraldine, MD [place] [facility] Pager: [phone] Cell: [phone] After 3pm or if no response, call [phone]   10/09/2017, 11:48 AM

## 2017-10-09 NOTE — Consult Note (Signed)
Consultation Note Date: 10/09/2017   Patient Name: Johnny [name]  DOB: 1935/02/23  MRN: [id number]  Age / Sex: 83 y.o., male  PCP: Marletta Lor, MD Referring Physician: Edwin Dada, *  Reason for Consultation: Establishing goals of care and Psychosocial/spiritual support  HPI/Patient Profile: 83 y.o. male  with past medical history of alcoholism, systolic heart failure, atrial fib, history of trach and acute respiratory failure requiring long convalescence (at [facility] in the past for 6 months) admitted on 09/26/2017 with acute respiratory failure..  Patient has now had a tracheostomy performed but is not weaning well from the ventilator support.  Patient also now showing acute kidney injury on top of chronic kidney disease.  Today's creatinine 2.95.  Patient had an ejection fraction of 40% in 2017 but is now at 20 to 25%.  Consult ordered for goals of care  Clinical Assessment and Goals of Care: Met with patient, critical care physician, Dr. Lake Bells, and patient's daughters Suprena and Ivin Booty.  Patient has 3 other children who reside out of town.  His daughter, Earnest Conroy, has healthcare power of attorney.  Patient at this point is on able to make healthcare decisions.  His healthcare POA, is his daughter, Earnest Conroy, [place]  Discussed with family patient's critical condition, now with multisystem organ failure.  Daughters have been struggling with goals of care as patient has had a tracheostomy in the past with a long recovery  upwards of 6 months at [facility] and was able to come home.  This was approximately 6 years ago.  They are able to articulate that they understand that his condition today, now with associated heart as well as kidney failure markedly affects his ability to recover.  We also discussed with family options of LTACH, and that that could mean patient  may not necessarily be able to obtain a bed in [facility].      SUMMARY OF RECOMMENDATIONS   Continue full scope of treatment for now Family plans to discuss with other siblings making patient full DNR as well as no hemodialysis. Ms. Nevada Crane and Ms Sherrye Payor are able to articulate that DNR would be their wish but wants to discuss with siblings before a firm change to code status   We did not discuss one-way extubation at this initial meeting  Palliative Medicine to stay in contact with family over the weekend and continue to help support patient and family with goals of care Code Status/Advance Care Planning:  Full code    Symptom Management:   Pain: Continue with fentanyl 50 mcg every 2 hours as needed.  Monitor for need for scheduled dosing and/or continuous infusion  Palliative Prophylaxis:   Aspiration, Bowel Regimen, Delirium Protocol, Eye Care, Frequent Pain Assessment, Oral Care and Turn Reposition  Additional Recommendations (Limitations, Scope, Preferences):  Full Scope Treatment  Psycho-social/Spiritual:   Desire for further Chaplaincy support:no  Additional Recommendations: Referral to Community Resources   Prognosis:   Unable to determine but despite all  aggressive measures,  patient has continued to not make progress weaning, remains vent dependent, encephalopathic, worsening heart failure with an ejection fraction of 20 to 25%, worsening kidney function with creatinine of 2.95.  I would not be surprised if things continue to go the way they have been despite all of these interventions if patient had a prognosis of just days to weeks  Discharge Planning: To Be Determined      Primary Diagnoses: Present on Admission: . Sepsis ([place])   I have reviewed the medical record, interviewed the patient and family, and examined the patient. The following aspects are pertinent.  Past Medical History:  Diagnosis Date  . Acute alcoholic hepatitis  7/37/3668  . Altered mental status 04/01/2012  . Anemia of other chronic disease 08/16/2007  . Arthritis   . CAD (coronary artery disease)    a. s/p CABG, notes unclear - 1998 or 2003?  Marland Kitchen Chronic low back pain   . Coagulopathy ([place])   . Diabetes mellitus ([place])   . Essential hypertension   . ETOH abuse   . Hallucination 04/01/2012  . History of pneumonia   . History of stroke   . Hyperlipidemia   . Paroxysmal atrial fibrillation (HCC)   . Paroxysmal atrial flutter ([place])    a. s/p ablation 2007.  Marland Kitchen PEA (Pulseless electrical activity) ([name])    a. h/o PEA cardiac arrest 04/2012 (felt r/t PNA, acidosis, hypoxic resp failure in setting of alcohol withdrawal; required tracheostomy)  . Protein calorie malnutrition ([place])   . Seizures ([place]) 04/01/2012   Social History   Socioeconomic History  . Marital status: Married    Spouse name: Not on file  . Number of children: Not on file  . Years of education: Not on file  . Highest education level: Not on file  Occupational History  . Not on file  Social Needs  . Financial resource strain: Not on file  . Food insecurity:    Worry: Not on file    Inability: Not on file  . Transportation needs:    Medical: Not on file    Non-medical: Not on file  Tobacco Use  . Smoking status: Former Smoker    Packs/day: 0.50    Years: 17.00    Pack years: 8.50    Types: Cigarettes    Last attempt to quit: 04/22/1983    Years since quitting: 34.4  . Smokeless tobacco: Never Used  . Tobacco comment: 04/01/2012 "quit smoking 20+ yr ago"  Substance and Sexual Activity  . Alcohol use: Yes    Alcohol/week: 10.8 oz    Types: 2 Glasses of wine, 16 Shots of liquor per week    Comment: hx of ETOH abuse; 04/01/2012 "drink ~ 1 pint//wk; vodka; cup of wine/wk" 04/2015 40 oz wine per day  . Drug use: No  . Sexual activity: Never  Lifestyle  . Physical activity:    Days per week: Not on file    Minutes per session: Not on file  . Stress: Not on file    Relationships  . Social connections:    Talks on phone: Not on file    Gets together: Not on file    Attends religious service: Not on file    Active member of club or organization: Not on file    Attends meetings of clubs or organizations: Not on file    Relationship status: Not on file  Other Topics Concern  . Not on file  Social History Narrative  . Not on file  Family History  Problem Relation Age of Onset  . Heart failure Mother   . Aneurysm Sister   . Suicidality Brother   . Heart failure Brother   . Diabetes Sister    Scheduled Meds: . aspirin  81 mg Per Tube Daily  . atorvastatin  20 mg Per Tube Daily  . carvedilol  6.25 mg Oral BID WC  . chlorhexidine gluconate (MEDLINE KIT)  15 mL Mouth Rinse BID  . Chlorhexidine Gluconate Cloth  6 each Topical Q0600  . clopidogrel  75 mg Per Tube Daily  . famotidine  20 mg Per Tube Daily  . feeding supplement (PRO-STAT SUGAR FREE 64)  30 mL Per Tube BID  . folic acid  1 mg Per Tube Daily  . heparin  5,000 Units Subcutaneous Q8H  . insulin aspart  0-9 Units Subcutaneous Q4H  . insulin glargine  5 Units Subcutaneous QHS  . mouth rinse  15 mL Mouth Rinse 10 times per day  . multivitamin with minerals  1 tablet Per Tube Daily  . thiamine  100 mg Per Tube Daily   Continuous Infusions: . feeding supplement (VITAL AF 1.2 CAL) 1,000 mL (10/08/17 2232)   PRN Meds:.atropine, docusate, fentaNYL (SUBLIMAZE) injection, LORazepam, ondansetron, promethazine, white petrolatum Medications Prior to Admission:  Prior to Admission medications   Medication Sig Start Date End Date Taking? Authorizing Provider  acetaminophen (TYLENOL) 325 MG tablet Take 2 tablets (650 mg total) by mouth every 6 (six) hours as needed for mild pain (or Fever >/= 101). 05/08/15  Yes Robbie Lis, MD  aspirin 81 MG tablet Take 81 mg by mouth daily.   Yes [provider]  atorvastatin (LIPITOR) 20 MG tablet Take 1 tablet (20 mg total) by mouth daily.  09/18/17  Yes Marletta Lor, MD  clopidogrel (PLAVIX) 75 MG tablet TAKE 1 TABLET BY MOUTH EVERY DAY 01/23/17  Yes Marletta Lor, MD  folic acid (FOLVITE) 1 MG tablet Take 1 tablet (1 mg total) by mouth daily. 01/29/17  Yes Marletta Lor, MD  metFORMIN (GLUCOPHAGE) 500 MG tablet TAKE 1 TABLET BY MOUTH EVERY DAY WITH BREAKFAST 09/24/17  Yes Marletta Lor, MD  metoprolol tartrate (LOPRESSOR) 50 MG tablet Take 1.5 tablets (75 mg total) by mouth 2 (two) times daily. 09/15/17  Yes Marletta Lor, MD  Multiple Vitamin (MULTIVITAMIN) tablet Take 1 tablet by mouth daily. Reported on 11/08/2015   Yes [provider]  nitroGLYCERIN (NITROSTAT) 0.4 MG SL tablet Place 1 tablet (0.4 mg total) under the tongue every 5 (five) minutes as needed for chest pain. Up to 3 doses. 11/18/16  Yes Hongalgi, Lenis Dickinson, MD  tamsulosin (FLOMAX) 0.4 MG CAPS capsule TAKE 1 CAPSULE (0.4 MG TOTAL) BY MOUTH DAILY. 10/13/16  Yes Marletta Lor, MD  thiamine ([employer] B-1) 100 MG tablet Take 1 tablet (100 mg total) by mouth daily. 01/23/17  Yes Marletta Lor, MD  ACCU-CHEK AVIVA PLUS test strip USE TO TEST BLOOD SUGAR ONCE A DAY 06/25/15   Marletta Lor, MD  ACCU-CHEK SOFTCLIX LANCETS lancets USE 1 EACH BY OTHER ROUTE DAILY AS NEEDED FOR OTHER. 11/28/15   Marletta Lor, MD  Blood Glucose Calibration (ACCU-CHEK AVIVA) SOLN  06/09/16   [provider]  Lancet Devices (ADJUSTABLE LANCING DEVICE) [place]  03/24/16   [provider]   No Known Allergies Review of Systems  Unable to perform ROS: Acuity of condition    Physical Exam  Cardiovascular: Normal rate.  Pulmonary/Chest:  Trach and ventilator support Patient has not been weaning well today Back on full support  Genitourinary:  Genitourinary Comments: Foley catheter  Neurological:  Patient will open his eyes to voice but will only minimally track you Doing simple commands today  Skin: Skin is warm and dry.   Psychiatric:  No overt agitation otherwise unable to test  Nursing note and vitals reviewed.   Vital Signs: BP (!) 110/55   Pulse 85   Temp 97.6 F (36.4 C) (Oral)   Resp (!) 21   Ht 5' 10.5" (1.791 m)   Wt 80.5 kg (177 lb 7.5 oz)   SpO2 100%   BMI 25.10 kg/m  Pain Scale: CPOT POSS *See Group Information*: 2-Acceptable,Slightly drowsy, easily aroused Pain Score: 0-No pain   SpO2: SpO2: 100 % O2 Device:SpO2: 100 % O2 Flow Rate: .O2 Flow Rate (L/min): 10 L/min  IO: Intake/output summary:   Intake/Output Summary (Last 24 hours) at 10/09/2017 1449 Last data filed at 10/09/2017 1400 Gross per 24 hour  Intake 1604.38 ml  Output 1120 ml  Net 484.38 ml    LBM: Last BM Date: 10/10/17 Baseline Weight: Weight: 81.6 kg (180 lb) Most recent weight: Weight: 80.5 kg (177 lb 7.5 oz)     Palliative Assessment/Data:   Flowsheet Rows     Most Recent Value  Intake Tab  Referral Department  Critical care  Unit at Time of Referral  ICU  Palliative Care Primary Diagnosis  Pulmonary  Date Notified  10/08/17  Palliative Care Type  New Palliative care  Reason for referral  Clarify Goals of Care, Psychosocial or Spiritual support  Date of Admission  09/19/2017  Date first seen by Palliative Care  10/09/17  # of days Palliative referral response time  1 Day(s)  # of days IP prior to Palliative referral  15  Clinical Assessment  Palliative Performance Scale Score  20%  Pain Max last 24 hours  Not able to report  Pain Min Last 24 hours  Not able to report  Dyspnea Max Last 24 Hours  Not able to report  Dyspnea Min Last 24 hours  Not able to report  Nausea Max Last 24 Hours  Not able to report  Nausea Min Last 24 Hours  Not able to report  Anxiety Max Last 24 Hours  Not able to report  Anxiety Min Last 24 Hours  Not able to report  Other Max Last 24 Hours  Not able to report  Psychosocial & Spiritual Assessment  Palliative Care Outcomes  Patient/Family meeting held?  Yes  Who was at  the meeting?  2 daughters  Palliative Care follow-up planned  Yes, Facility      Time In: 1330 Time Out: 1500 Time Total: 90 min Greater than 50%  of this time was spent counseling and coordinating care related to the above assessment and plan. Staffed with Dr. Loleta Books and Dr. Lake Bells  Signed by: Dory Horn, NP   Please contact Palliative Medicine Team phone at [phone] for questions and concerns.  For individual provider: See Shea Evans

## 2017-10-09 NOTE — Progress Notes (Signed)
PROGRESS NOTE    Johnny [name]  TKW:[id number] DOB: Apr 18, 1935 DOA: 10/08/2017 PCP: Marletta Lor, MD      Brief Narrative:  Johnny [name] is a 83 y.o. M with CHF EF 40%, isch CM s/p CABG in 2003, NIDDM, HTN, alcoholism and parox atrial fibrillation not on warfarin but s/p Ablation in 2009 who presented with septic shock presumed respiratory source, aspiration vs CAP.  Per admission H&P, he is now back to being mostly wheelchair bound, had been in the midst of a several day alcohol binge again when he started coughin, then on the day of admission, developed sudden severe respiratory distress.  When he arrived to ED he was desaturating to 50%, required intubation promptly, then started on pressors and admitted to ICU.       Assessment & Plan:  Acute hypoxic respiratory failure Multifocal community acquired pneumonia versus aspiration pneumonia Pleural effusions Hypoxic respiratory failure largely from pneumonia, unclear if this was community-acquired versus aspiration.  He did complete 8 days cefepime, 6 days vancomycin.  No further fever.  Respiratory failure was complicated by congestive heart failure for which he required IV diuresis, also completed.  Has been slow to wean from ventilator, required tracheostomy.  Weaned well last two days, but today very agitated, dyssynchronous, RT and I plan to maintain on full mechanical ventilation today.   -Continue full mechanical ventilation -Attempt pressure support trial tomorrow -Consult to Palliative Care -Continue restraints -Continue famotidine   Goals of care Renal function worsening, but anemia stable, thrombocytopenia resolved, CHF appears euvolemic, and tolerated last two days of pressure support well.  Also, he appears alert to me, follows most commands.  Family meeting planned for tomorrow with Palliative Care.  I suspect, given his mental status, family will be compelled to continue current cares and attempts to wean his  ventilator.     Acute on chronic systolic CHF EF previously 40%, last cath 2018, no targets.  Now EF 20-25%.  Close to net even yesterday, potassium and magnesium supplemented. -Hold Lasix -Daily weights -Continue carvedilol -Keep K>4, mag>2, supplement both today -Call Cardiology at d/c for discharge follow up with [name]  Acute on chronic kidney failure stage III Baseline Cr 1.2-1.3.  Admitted with Cr 1.7, improved to baseline, then since then has been worsening daily.  Diuresis has been stopped.  Creatinine stable from yesterday, minimal increase. -Daily BMP  Recurrence of atrial fibrillation Multifocal atrial tachycardia CHADS2Vasc 8, but not candidate for anticoagulation in setting of active alcohol abuse.   Continue carvedilol  Ventricular tachycardia Has not recurred.  Magnesium and potassium of been supplemented. -BMP daily -Continue carvedilol  Coronary disease Ischemic cardiomyopathy Hypertension Elevated troponin Troponin represents demand NSTEMI, not acute coronary syndrome.  No ischemic work up planned. -Continue aspirin, statin, beta-blocker, Plavix  Diabetes Somewhat hyperglycemic -Increase Lantus -Continue SSI  Chronic normocytic anemia Stable, Hgb no change  Thrombocytopenia This has resolved.  Alcoholism His cardiology notes suggest that he was abstinent from alcohol from 2017-2019.  Admission note suggests that he had relapsed to some extent.  he has had withdrawal seizures in the past, no mention of DTs.  He is now out of the window for withdrawals.  Precedex has been stopped.  He is not at risk for relapse, in his current dependent state. -Continue folate and thiamine -Start lorazepam 1 mg per tube PRN for agitation, anxiety  Nutrition Enteral feeding tube in place. -Continue tube feeds -Consult to nutrition  Neck wound From trach. -Consult to  WOC -Continue fentanyl 50 mcg q2hrs PRN for pain      DVT prophylaxis: Heparin Code  Status: FULL Family Communication: None present MDM and disposition Plan: The below labs and imaging reports were reviewed and summarized above.    The patient was admitted with septic shock from [facility] pneumonia versus aspiration pneumonia.  He was treated with antibiotics, his pneumonia has resolved, but his respiratory failure has persisted.  This is comp located by CHF and renal failure.  His CHF is been stated, but his renal failure is worsening, and his overall prognosis is extremely poor.  Palliative care been consulted for goals of care management, as we progress towards long-term placement.  Consultants:   CCM  Cardiology  WOC  Procedures:   Echo 6/6 LV EF: 20% -   25%  ------------------------------------------------------------------- Indications:      Shock unspecified 785.50.  ------------------------------------------------------------------- History:   PMH:  Hypotension.  ------------------------------------------------------------------- Study Conclusions  - Left ventricle: The cavity size was severely dilated. Systolic   function was severely reduced. The estimated ejection fraction   was in the range of 20% to 25%. Diffuse hypokinesis. The study is   not technically sufficient to allow evaluation of LV diastolic   function. - Mitral valve: Severely calcified annulus. Moderately thickened   leaflets . There was mild regurgitation. - Left atrium: The atrium was mildly dilated.   Right IJ central line 6/5 >>  Intubated 6/5  Trach 6/12   Antimicrobials:   Vancomycin 6/5 >> 6/10  Cefepime 6/5 >> 6/12    Cultures:   6/5 blood cultures x2: NGTD    Subjective: Intubated, not clear that he is alert to me today, eyes open, does not follow commands, much more agitated this morning per nursing, appears to be in pain "everywhere".  dysSynchronous on the vent  Objective: Vitals:   10/09/17 0745 10/09/17 0800 10/09/17 0801 10/09/17 0807  BP:   102/75    Pulse:  87 89   Resp:   (!) 23   Temp: 98.5 F (36.9 C)     TempSrc: Oral     SpO2:  100% 100% 100%  Weight:      Height:        Intake/Output Summary (Last 24 hours) at 10/09/2017 0904 Last data filed at 10/09/2017 0800 Gross per 24 hour  Intake 1399.38 ml  Output 1185 ml  Net 214.38 ml   Filed Weights   10/07/17 0400 10/08/17 0301 10/09/17 0326  Weight: 79 kg (174 lb 2.6 oz) 81.6 kg (179 lb 14.3 oz) 80.5 kg (177 lb 7.5 oz)    Examination: General appearance: Thin adult male, in bed, restraints and mitts on, on ventilator HEENT: Anicteric, conjunctival watery, lids and lashes normal.  No nasal deformity, discharge, epistaxis.  Lips dry, edentulous.  Oropharynx dry, no oral lesions.  Hearing unable to assess. Skin: Skin warm and dry, wound under trach cuff has bandage.  No other suspicious rashes or lesions Cardiac: Tachycardic, regular, no murmurs.  No lower extremity edema. Respiratory: Tachypneic on the ventilator.  Very coarse breath sounds bilaterally.  No wheezing. Abdomen: Abdomen is some positive voluntary guarding, no grimace to palpation, no masses appreciated.  No distention or ascites. MSK: No deformities or effusions of the large joints of the upper or lower extremities bilaterally. Neuro: Eyes open, appears awake.  In restraints.  Does not follow commands today. Psych: Unable to assess.    Data Reviewed: I have personally reviewed following labs and imaging studies:  CBC: Recent Labs  Lab 10/03/17 0615 10/05/17 0346 10/07/17 0507 10/08/17 0333 10/09/17 0313  WBC 11.0* 14.6* 17.6* 13.6* 13.9*  NEUTROABS  --   --  13.7* 10.1*  --   HGB 8.9* 8.5* 8.5* 8.7* 8.7*  HCT 26.7* 25.9* 26.1* 27.9* 28.2*  MCV 92.7 91.8 95.3 97.6 98.6  PLT 163 209 251 264 536   Basic Metabolic Panel: Recent Labs  Lab 10/03/17 0615  10/05/17 0346 10/05/17 1643 10/06/17 0444 10/07/17 0507 10/08/17 0333 10/09/17 0313  NA 133*   < > 136 139 140 138 142 143  K 3.8    < > 3.7 3.6 3.8 3.3* 3.3* 4.0  CL 102   < > 103 104 105 105 108 112*  CO2 21*   < > 20* 18* 20* 21* 21* 20*  GLUCOSE 213*   < > 172* 242* 107* 178* 163* 242*  BUN 91*   < > 115* 120* 120* 127* 141* 159*  CREATININE 1.63*   < > 2.10* 2.23* 2.26* 2.75* 2.89* 2.95*  CALCIUM 8.1*   < > 8.2* 8.5* 8.7* 8.5* 8.5* 8.2*  MG 1.7  --  1.6* 1.7  --   --   --  2.9*   < > = values in this interval not displayed.   GFR: Estimated Creatinine Clearance: 21 mL/min (A) (by C-G formula based on SCr of 2.95 mg/dL (H)). Liver Function Tests: Recent Labs  Lab 10/06/17 1005  AST 59*  ALT 46  ALKPHOS 152*  BILITOT 1.3*  PROT 6.6  ALBUMIN 2.3*   Recent Labs  Lab 10/06/17 1005  LIPASE 25   No results for input(s): AMMONIA in the last 168 hours. Coagulation Profile: No results for input(s): INR, PROTIME in the last 168 hours. Cardiac Enzymes: No results for input(s): CKTOTAL, CKMB, CKMBINDEX, TROPONINI in the last 168 hours. BNP (last 3 results) No results for input(s): PROBNP in the last 8760 hours. HbA1C: No results for input(s): HGBA1C in the last 72 hours. CBG: Recent Labs  Lab 10/08/17 1610 10/08/17 1940 10/09/17 0010 10/09/17 0337 10/09/17 0750  GLUCAP 115* 157* 199* 199* 199*   Lipid Profile: No results for input(s): CHOL, HDL, LDLCALC, TRIG, CHOLHDL, LDLDIRECT in the last 72 hours. Thyroid Function Tests: No results for input(s): TSH, T4TOTAL, FREET4, T3FREE, THYROIDAB in the last 72 hours. Anemia Panel: No results for input(s): VITAMINB12, FOLATE, FERRITIN, TIBC, IRON, RETICCTPCT in the last 72 hours. Urine analysis:    Component Value Date/Time   COLORURINE AMBER (A) 09/28/2017 1615   APPEARANCEUR HAZY (A) 09/20/2017 1615   LABSPEC 1.017 10/12/2017 1615   PHURINE 5.0 09/20/2017 1615   GLUCOSEU NEGATIVE 10/08/2017 1615   HGBUR MODERATE (A) 09/19/2017 1615   BILIRUBINUR NEGATIVE 09/26/2017 1615   KETONESUR NEGATIVE 10/15/2017 1615   PROTEINUR 100 (A) 09/24/2017 1615    UROBILINOGEN 1.0 04/02/2012 0204   NITRITE NEGATIVE 10/14/2017 1615   LEUKOCYTESUR NEGATIVE 09/30/2017 1615   Sepsis Labs: '@LABRCNTIP' (procalcitonin:4,lacticacidven:4)  )No results found for this or any previous visit (from the past 240 hour(s)).       Radiology Studies: Dg Chest Port 1 View  Result Date: 10/08/2017 CLINICAL DATA:  Acute respiratory failure EXAM: PORTABLE CHEST 1 VIEW COMPARISON:  10/07/2017 FINDINGS: Tracheostomy tube, feeding catheter and right jugular central line are again seen and stable. Cardiac shadow is stable. Postsurgical changes are again seen. Small bilateral pleural effusions are noted left greater than right. Mild left basilar atelectasis is noted as well. IMPRESSION: No significant interval  change from the prior exam. Small bilateral pleural effusions are again seen. Electronically Signed   By: Inez Catalina M.D.   On: 10/08/2017 08:29        Scheduled Meds: . aspirin  81 mg Per Tube Daily  . atorvastatin  20 mg Per Tube Daily  . carvedilol  6.25 mg Oral BID WC  . chlorhexidine gluconate (MEDLINE KIT)  15 mL Mouth Rinse BID  . Chlorhexidine Gluconate Cloth  6 each Topical Q0600  . clopidogrel  75 mg Per Tube Daily  . famotidine  20 mg Per Tube Daily  . feeding supplement (PRO-STAT SUGAR FREE 64)  30 mL Per Tube BID  . folic acid  1 mg Per Tube Daily  . heparin  5,000 Units Subcutaneous Q8H  . insulin aspart  0-9 Units Subcutaneous Q4H  . insulin glargine  5 Units Subcutaneous QHS  . mouth rinse  15 mL Mouth Rinse 10 times per day  . multivitamin with minerals  1 tablet Per Tube Daily  . thiamine  100 mg Per Tube Daily   Continuous Infusions: . feeding supplement (VITAL AF 1.2 CAL) 1,000 mL (10/08/17 2232)     LOS: 16 days    Patient is critically ill with multiple organ failure of the lungs, kidneys, and hard.  He requires ongoing mechanical relation, without management which he would die.  Total critical care time 30 minutes.  Critical  care time was exclusive of separately billable procedures and treating other patients.  Critical care was time spent personally by me on the following activities: Films and treatment plan with nursing, respiratory therapy, discussion with consultants, examination of patient, obtaining history from the patient or the chart, ordering and performing treatments and interventions, ordering and review of laboratory studies, and ordering and review of radiographic studies, and pulse oximetry and reevaluation of patient's condition.      Edwin Dada, MD [facility] 10/09/2017, 9:04 AM     Pager [phone] --- please page though AMION:  [email or web address] Password [email or web address] If 7PM-7AM, please contact night-coverage

## 2017-10-09 NOTE — Plan of Care (Signed)
  Problem: Elimination: Goal: Will not experience complications related to bowel motility Outcome: Not Progressing Note:  Patient with increase in output from flexiseal.    Problem: Pain Managment: Goal: General experience of comfort will improve Outcome: Not Progressing Note:  Patient seems increasingly sensitive to pain. Every movement/touch causes patient to wince.

## 2017-10-10 LAB — CBC
HCT: 27.8 % — ABNORMAL LOW (ref 39.0–52.0)
Hemoglobin: 8.6 g/dL — ABNORMAL LOW (ref 13.0–17.0)
MCH: 30.8 pg (ref 26.0–34.0)
MCHC: 30.9 g/dL (ref 30.0–36.0)
MCV: 99.6 fL (ref 78.0–100.0)
PLATELETS: 243 10*3/uL (ref 150–400)
RBC: 2.79 MIL/uL — ABNORMAL LOW (ref 4.22–5.81)
RDW: 18.6 % — ABNORMAL HIGH (ref 11.5–15.5)
WBC: 16.4 10*3/uL — ABNORMAL HIGH (ref 4.0–10.5)

## 2017-10-10 LAB — GLUCOSE, CAPILLARY
GLUCOSE-CAPILLARY: 100 mg/dL — AB (ref 65–99)
Glucose-Capillary: 213 mg/dL — ABNORMAL HIGH (ref 65–99)
Glucose-Capillary: 269 mg/dL — ABNORMAL HIGH (ref 65–99)
Glucose-Capillary: 213 mg/dL — ABNORMAL HIGH (ref 65–99)
Glucose-Capillary: 174 mg/dL — ABNORMAL HIGH (ref 65–99)
Glucose-Capillary: 119 mg/dL — ABNORMAL HIGH (ref 65–99)

## 2017-10-10 LAB — BASIC METABOLIC PANEL
Anion gap: 11 (ref 5–15)
BUN: 169 mg/dL — ABNORMAL HIGH (ref 6–20)
CO2: 21 mmol/L — AB (ref 22–32)
CREATININE: 2.77 mg/dL — AB (ref 0.61–1.24)
Calcium: 8.3 mg/dL — ABNORMAL LOW (ref 8.9–10.3)
Chloride: 114 mmol/L — ABNORMAL HIGH (ref 101–111)
GFR calc non Af Amer: 20 mL/min — ABNORMAL LOW (ref >60)
GFR, EST AFRICAN AMERICAN: 23 mL/min — AB (ref >60)
GLUCOSE: 247 mg/dL — AB (ref 65–99)
Potassium: 3.9 mmol/L (ref 3.5–5.1)
Sodium: 146 mmol/L — ABNORMAL HIGH (ref 135–145)

## 2017-10-10 MED ORDER — FREE WATER
100.0000 mL | Freq: Three times a day (TID) | Status: DC
  Administered 2017-10-10 – 2017-10-11 (×4): 100 mL

## 2017-10-10 MED ORDER — INSULIN GLARGINE 100 UNIT/ML ~~LOC~~ SOLN
8.0000 [IU] | Freq: Every day | SUBCUTANEOUS | Status: DC
  Administered 2017-10-10 – 2017-10-23 (×13): 8 [IU] via SUBCUTANEOUS
  Filled 2017-10-10 (×14): qty 0.08

## 2017-10-10 NOTE — Progress Notes (Signed)
Touch base with patient's daughter, healthcare power of attorney, Johnny [name]. Johnny [name] is at work today and not arriving to the unit until tonight. She states she has updated her siblings and given them Palliative medicine's team phone number. I did offer again to call them but daughter preferred to leave things for now, that they will call us with any questions.  Palliative medicine to stay involved Thank you,  Eduard RouxSarah Rashi Granier, ANP

## 2017-10-10 NOTE — Plan of Care (Signed)
  Problem: Pain Managment: Goal: General experience of comfort will improve Outcome: Progressing Note:  Patient consistently in what appears to be tremendous pain-grimacing with every movement / staff intervention. Order in MD note to give Fentanyl 50 mcg every 2 hours for pain (changed in system from RASS goal maintenance- ) Patient does very well with this amount of pain medication. Patient appears to be sleeping for short periods for the first time in days.

## 2017-10-10 NOTE — Care Management (Signed)
Dr. Maryfrances Bunnellanford states [facility]  Call placed to Ezra SitesKathy Smith with [facility] to initiate admission.  Patient's insurance requires pre-authorization, which will take up to 2 days and can be started on Monday.  Anticipate transfer of patient on Wednesday 6/26 or later depending on bed availability.

## 2017-10-10 NOTE — Progress Notes (Signed)
PROGRESS NOTE    Johnny [name]  POE:[id number] DOB: June 24, 1934 DOA: 10/07/2017 PCP: Marletta Lor, MD      Brief Narrative:  Johnny [name] is a 83 y.o. M with CHF EF 40%, isch CM s/p CABG in 2003, NIDDM, HTN, alcoholism and parox atrial fibrillation not on warfarin but s/p Ablation in 2009 who presented with septic shock presumed respiratory source, aspiration vs CAP.  Per admission H&P, he is now back to being mostly wheelchair bound, had been in the midst of a several day alcohol binge again when he started coughin, then on the day of admission, developed sudden severe respiratory distress.  When he arrived to ED he was desaturating to 50%, required intubation promptly, then started on pressors and admitted to ICU.       Assessment & Plan:  Acute hypoxic respiratory failure Multifocal community acquired pneumonia versus aspiration pneumonia Pleural effusions Hypoxic respiratory failure largely from pneumonia, unclear if this was community-acquired versus aspiration.  He did complete 8 days cefepime, 6 days vancomycin.  No further fever.  Respiratory failure was complicated by congestive heart failure for which he required IV diuresis, also completed.  Has been slow to wean from ventilator, required tracheostomy.    Yesterday did not wean well, but today, able to tolerate CPAP 5/5 only.  Still tachypneic but tidal volumes good.    -Continue full mechanical ventilation with CPAP trials as able -Daily pressure support trial -Consult to Palliative Care -Continue restraints -Continue H2RA   Goals of care Renal function worsening, but anemia stable, thrombocytopenia resolved, CHF appears euvolemic, and tolerated last two days of pressure support well.  Also, he appears alert to me, follows most commands.   -Plan for LTAC    Acute on chronic systolic CHF EF previously 40%, last cath 2018, no targets.  Now EF 20-25%.  Close to net even yesterday, potassium and magnesium  supplemented. -Hold Lasix -Daily weights -Continue carvedilol -Keep K>4, mag>2 -Call Cardiology at d/c for discharge follow up with [name]  Acute on chronic kidney failure stage III Baseline Cr 1.2-1.3.  Admitted with Cr 1.7, improved to baseline, then since then has been worsening daily.  Diuresis has been stopped.  Creatinine stable from yesterday, minimal increase. -Daily BMP  Recurrence of atrial fibrillation Multifocal atrial tachycardia CHADS2Vasc 8, but not candidate for anticoagulation in setting of active alcohol abuse.   -Continue carvedilol  Ventricular tachycardia Has not recurred.  Magnesium and potassium of been supplemented. -BMP daily -Continue carvedilol  Coronary disease Ischemic cardiomyopathy Hypertension Elevated troponin Troponin represents demand NSTEMI, not acute coronary syndrome.  No ischemic work up planned. -Continue aspirin, statin, BB, Plavix  Diabetes Somewhat hyperglycemic -Increase Lantus again -Continue SSI  Chronic normocytic anemia Hgb stable again  Thrombocytopenia This has resolved.  Alcoholism His cardiology notes suggest that he was abstinent from alcohol from 2017-2019.  Admission note suggests that he had relapsed to some extent.  he has had withdrawal seizures in the past, no mention of DTs.  He is now out of the window for withdrawals.  Precedex has been stopped.  He is not at risk for relapse, in his current dependent state. -Continue folate and thiamine -Continue loraz 1 mg per tube PRN for agitation, anxiety  Nutrition Enteral feeding tube in place. -Continue tube feeds -Consult to nutrition  Neck wound From trach. -Consult to [place] -Continue fentanyl 50 mcg q2hrs PRN for pain      DVT prophylaxis: Heparin Code Status: FULL Family Communication: None  present MDM and disposition Plan: The below labs and imaging reports were reviewed and summarized above.    The patient was admitted with septic shock from  community acquired pneumonia versus aspiration pneumonia.  He was treated with antibiotics, his pneumonia has resolved, but his respiratory failure has persisted.  This is comp located by CHF and renal failure.  His CHF was diuresed and resolved, but his renal failure is worsening, and his overall prognosis is extremely poor.  Palliative care been consulted for goals of care management, as we progress towards long-term placement.  Consultants:   CCM  Cardiology  WOC  Procedures:   Echo 6/6 LV EF: 20% -   25%  ------------------------------------------------------------------- Indications:      Shock unspecified 785.50.  ------------------------------------------------------------------- History:   PMH:  Hypotension.  ------------------------------------------------------------------- Study Conclusions  - Left ventricle: The cavity size was severely dilated. Systolic   function was severely reduced. The estimated ejection fraction   was in the range of 20% to 25%. Diffuse hypokinesis. The study is   not technically sufficient to allow evaluation of LV diastolic   function. - Mitral valve: Severely calcified annulus. Moderately thickened   leaflets . There was mild regurgitation. - Left atrium: The atrium was mildly dilated.   Right IJ central line 6/5 >>  Intubated 6/5  Trach 6/12   Antimicrobials:   Vancomycin 6/5 >> 6/10  Cefepime 6/5 >> 6/12    Cultures:   6/5 blood cultures x2: NGTD    Subjective: Intubated, sedated today on lorazepam, fentanyl.  Vent on CPAP at present.  Breathing fast but regular.  No fever, diarrhea, distress.   No agitation.  Objective: Vitals:   10/10/17 1200 10/10/17 1245 10/10/17 1300 10/10/17 1400  BP: (!) 116/44  (!) 127/59 (!) 118/55  Pulse: 81  88 80  Resp: (!) 23  (!) 30 (!) 28  Temp:  98.2 F (36.8 C)    TempSrc:  Oral    SpO2: 100%  100% 100%  Weight:      Height:        Intake/Output Summary (Last 24 hours)  at 10/10/2017 1529 Last data filed at 10/10/2017 1400 Gross per 24 hour  Intake 1757.93 ml  Output 1185 ml  Net 572.93 ml   Filed Weights   10/08/17 0301 10/09/17 0326 10/10/17 0327  Weight: 81.6 kg (179 lb 14.3 oz) 80.5 kg (177 lb 7.5 oz) 80 kg (176 lb 5.9 oz)    Examination: General appearance: Thin ill appearing elderly male on vent via trach. HEENT: Anicteric, conjunctiva watery, lips dry, edentulous, OP tacky, no oral lesions. Skin: Skin warm and dry, wound under trach cuff has bandage.  No other suspicious rashes or lesions Cardiac: Tachycardic, regular.  No murmurs. Respiratory: Tachypneic on ventilator, actually lungs clearer today, no wheezing. Abdomen: Abdomen has some voluntary guarding, grimace to palpation, no masses appreciated.  No distension.   MSK: No deformities or effusions of the large joints of the upper or lower extremities bilaterally. Neuro: Opens eyes to touch, nods head yes once, then drifts back off to sleep.  In restraints.  Does not consistently follow commands again today.     Psych: Unable to assess.    Data Reviewed: I have personally reviewed following labs and imaging studies:  CBC: Recent Labs  Lab 10/05/17 0346 10/07/17 0507 10/08/17 0333 10/09/17 0313 10/10/17 0317  WBC 14.6* 17.6* 13.6* 13.9* 16.4*  NEUTROABS  --  13.7* 10.1*  --   --   HGB  8.5* 8.5* 8.7* 8.7* 8.6*  HCT 25.9* 26.1* 27.9* 28.2* 27.8*  MCV 91.8 95.3 97.6 98.6 99.6  PLT 209 251 264 256 419   Basic Metabolic Panel: Recent Labs  Lab 10/05/17 0346 10/05/17 1643 10/06/17 0444 10/07/17 0507 10/08/17 0333 10/09/17 0313 10/10/17 0317  NA 136 139 140 138 142 143 146*  K 3.7 3.6 3.8 3.3* 3.3* 4.0 3.9  CL 103 104 105 105 108 112* 114*  CO2 20* 18* 20* 21* 21* 20* 21*  GLUCOSE 172* 242* 107* 178* 163* 242* 247*  BUN 115* 120* 120* 127* 141* 159* 169*  CREATININE 2.10* 2.23* 2.26* 2.75* 2.89* 2.95* 2.77*  CALCIUM 8.2* 8.5* 8.7* 8.5* 8.5* 8.2* 8.3*  MG 1.6* 1.7  --   --    --  2.9*  --    GFR: Estimated Creatinine Clearance: 22.3 mL/min (A) (by C-G formula based on SCr of 2.77 mg/dL (H)). Liver Function Tests: Recent Labs  Lab 10/06/17 1005  AST 59*  ALT 46  ALKPHOS 152*  BILITOT 1.3*  PROT 6.6  ALBUMIN 2.3*   Recent Labs  Lab 10/06/17 1005  LIPASE 25   No results for input(s): AMMONIA in the last 168 hours. Coagulation Profile: No results for input(s): INR, PROTIME in the last 168 hours. Cardiac Enzymes: No results for input(s): CKTOTAL, CKMB, CKMBINDEX, TROPONINI in the last 168 hours. BNP (last 3 results) No results for input(s): PROBNP in the last 8760 hours. HbA1C: No results for input(s): HGBA1C in the last 72 hours. CBG: Recent Labs  Lab 10/09/17 1950 10/09/17 2350 10/10/17 0307 10/10/17 0823 10/10/17 1242  GLUCAP 97 136* 213* 269* 213*   Lipid Profile: No results for input(s): CHOL, HDL, LDLCALC, TRIG, CHOLHDL, LDLDIRECT in the last 72 hours. Thyroid Function Tests: No results for input(s): TSH, T4TOTAL, FREET4, T3FREE, THYROIDAB in the last 72 hours. Anemia Panel: No results for input(s): VITAMINB12, FOLATE, FERRITIN, TIBC, IRON, RETICCTPCT in the last 72 hours. Urine analysis:    Component Value Date/Time   COLORURINE AMBER (A) 09/27/2017 1615   APPEARANCEUR HAZY (A) 10/06/2017 1615   LABSPEC 1.017 09/28/2017 1615   PHURINE 5.0 09/28/2017 1615   GLUCOSEU NEGATIVE 10/03/2017 1615   HGBUR MODERATE (A) 09/24/2017 1615   BILIRUBINUR NEGATIVE 09/19/2017 1615   KETONESUR NEGATIVE 09/20/2017 1615   PROTEINUR 100 (A) 10/04/2017 1615   UROBILINOGEN 1.0 04/02/2012 0204   NITRITE NEGATIVE 10/16/2017 1615   LEUKOCYTESUR NEGATIVE 10/03/2017 1615   Sepsis Labs: '@LABRCNTIP' (procalcitonin:4,lacticacidven:4)  )No results found for this or any previous visit (from the past 240 hour(s)).       Radiology Studies: No results found.      Scheduled Meds: . aspirin  81 mg Per Tube Daily  . atorvastatin  20 mg Per Tube  Daily  . carvedilol  6.25 mg Oral BID WC  . chlorhexidine gluconate (MEDLINE KIT)  15 mL Mouth Rinse BID  . Chlorhexidine Gluconate Cloth  6 each Topical Q0600  . clopidogrel  75 mg Per Tube Daily  . famotidine  20 mg Per Tube Daily  . feeding supplement (PRO-STAT SUGAR FREE 64)  30 mL Per Tube BID  . folic acid  1 mg Per Tube Daily  . free water  100 mL Per Tube Q8H  . heparin  5,000 Units Subcutaneous Q8H  . insulin aspart  0-9 Units Subcutaneous Q4H  . insulin glargine  8 Units Subcutaneous QHS  . mouth rinse  15 mL Mouth Rinse 10 times per day  .  multivitamin with minerals  1 tablet Per Tube Daily  . thiamine  100 mg Per Tube Daily   Continuous Infusions: . sodium chloride Stopped (10/10/17 1121)  . feeding supplement (VITAL AF 1.2 CAL) 1,000 mL (10/10/17 1442)     LOS: 17 days     Patient is critically ill with multiple organ failure of lungs, kidneys, and heart.  He requires ongoing mechanical ventilation, without management of which he would die.  Total critical care time 40 minutes.  Critical care time was exclusive of separately billable procedures and treating other patients.  Critical care was time spent personally by me on the following activities: Discussing treatment plan with nursing, RT.  Discussion with consultants, examination of patient.  Examination of patient and ordering and performing treatments and intervenstions, ordering and review of lab studies, and ordering and review of films.  As well as pulse oximetry and reevaluation of patient's conditition.      Edwin Dada, MD [facility] 10/10/2017, 3:29 PM     Pager [phone] --- please page though AMION:  [email or web address] If 7PM-7AM, please contact night-coverage

## 2017-10-11 ENCOUNTER — Inpatient Hospital Stay (HOSPITAL_COMMUNITY): Payer: Medicare HMO

## 2017-10-11 DIAGNOSIS — Z515 Encounter for palliative care: Secondary | ICD-10-CM

## 2017-10-11 LAB — CBC
HCT: 29.6 % — ABNORMAL LOW (ref 39.0–52.0)
Hemoglobin: 9.2 g/dL — ABNORMAL LOW (ref 13.0–17.0)
MCH: 30.4 pg (ref 26.0–34.0)
MCHC: 31.1 g/dL (ref 30.0–36.0)
MCV: 97.7 fL (ref 78.0–100.0)
Platelets: 232 10*3/uL (ref 150–400)
RBC: 3.03 MIL/uL — ABNORMAL LOW (ref 4.22–5.81)
RDW: 19.5 % — AB (ref 11.5–15.5)
WBC: 17 10*3/uL — ABNORMAL HIGH (ref 4.0–10.5)

## 2017-10-11 LAB — GLUCOSE, CAPILLARY
GLUCOSE-CAPILLARY: 83 mg/dL (ref 65–99)
GLUCOSE-CAPILLARY: 92 mg/dL (ref 65–99)
Glucose-Capillary: 191 mg/dL — ABNORMAL HIGH (ref 65–99)
Glucose-Capillary: 170 mg/dL — ABNORMAL HIGH (ref 65–99)
Glucose-Capillary: 73 mg/dL (ref 65–99)

## 2017-10-11 LAB — BASIC METABOLIC PANEL
ANION GAP: 10 (ref 5–15)
BUN: 177 mg/dL — AB (ref 6–20)
CO2: 19 mmol/L — AB (ref 22–32)
Calcium: 8.5 mg/dL — ABNORMAL LOW (ref 8.9–10.3)
Chloride: 118 mmol/L — ABNORMAL HIGH (ref 101–111)
Creatinine, Ser: 2.47 mg/dL — ABNORMAL HIGH (ref 0.61–1.24)
GFR calc Af Amer: 27 mL/min — ABNORMAL LOW (ref >60)
GFR calc non Af Amer: 23 mL/min — ABNORMAL LOW (ref >60)
GLUCOSE: 124 mg/dL — AB (ref 65–99)
POTASSIUM: 4.1 mmol/L (ref 3.5–5.1)
Sodium: 147 mmol/L — ABNORMAL HIGH (ref 135–145)

## 2017-10-11 MED ORDER — FREE WATER
200.0000 mL | Freq: Three times a day (TID) | Status: DC
  Administered 2017-10-11 – 2017-10-15 (×12): 200 mL

## 2017-10-11 NOTE — Progress Notes (Signed)
Chart reviewed, staffed with Dr. Maryfrances Bunnellanford.  Also spoke with patient's daughter, Arby BarretteSuprena Moraina, who is healthcare POA.  They are still trying to come to terms with an ongoing plan regarding LTACH and CODE STATUS.  I did reiterate  to Suprena  this morning that another path  forward versus LTACH would be more  comfort based approach as well as one-way extubation but it feels as though multiple family members are struggling with this and likely moving towards LTACH.  Sup[facility], leave that door open so to speak.  She has one other sibling that she wants to talk to about CODE STATUS  but they are leaning towards DNR, but not one-way extubation.  Will communicate this to Dr. Maryfrances Bunnellanford.  Palliative to remain involved and help finalize GOC as well as disposition. Thank you,  Eduard RouxSarah Reyes [place], ANP  Time Spent: 20 minutes

## 2017-10-11 NOTE — Progress Notes (Signed)
PROGRESS NOTE    AYAZ SONDGEROTH  QIO:[id number] DOB: 1934/07/16 DOA: 09/19/2017 PCP: Marletta Lor, MD      Brief Narrative:  Johnny [name] is a 83 y.o. M with CHF EF 40%, isch CM s/p CABG in 2003, NIDDM, HTN, alcoholism and parox atrial fibrillation not on warfarin but s/p Ablation in 2009 who presented with septic shock presumed respiratory source, aspiration vs CAP.  Per admission H&P, he is now back to being mostly wheelchair bound, had been in the midst of a several day alcohol binge again when he started coughin, then on the day of admission, developed sudden severe respiratory distress.  When he arrived to ED he was desaturating to 50%, required intubation promptly, then started on pressors and admitted to ICU.       Assessment & Plan:  Acute hypoxic respiratory failure Multifocal community acquired pneumonia versus aspiration pneumonia Pleural effusions Hypoxic respiratory failure largely from pneumonia, unclear if this was community-acquired versus aspiration.  He did complete 8 days cefepime, 6 days vancomycin.  No further fever.  Respiratory failure was complicated by congestive heart failure for which he required IV diuresis, also completed.  Has been slow to wean from ventilator, required tracheostomy, but slowly weaning now.    Tolerated 5/5 CPAP yesterday, again today.    -Continue full mechanical ventilation at night with CPAP trials during the day -Consult to Palliative Care -Continue restraints -Continue famotidine   Goals of care Renal function worsening, but anemia stable, thrombocytopenia resolved, CHF appears euvolemic, and tolerated last two days of pressure support well.  Also, he appears alert to me, follows most commands.     Acute on chronic systolic CHF EF previously 40%, last cath 2018, no targets.  Now EF 20-25%.  K and mag normal. -Hold Lasix -Daily weights -Continue carvedilol -Keep K>4, mag>2 -Call Cardiology at d/c for discharge follow up with  [name]  Acute on chronic kidney failure stage III Baseline Cr 1.2-1.3.  Admitted with Cr 1.7, improved to baseline, then worsening, peaked around 2.7.  Diuresis stopped and in last 2 days improvintg, now to 2.5 mg/dL today.   -Daily BMP  Hypernatremia -Increase free water -Daily BMP  Recurrence of atrial fibrillation Multifocal atrial tachycardia CHADS2Vasc 8, but had been deemed not candidate for anticoagulation in setting of active alcohol abuse.   -Continue carvedilol -Will readdress anticoagulation with family  Ventricular tachycardia Has not recurred.  -BMP daily -Continue carvedilol -Keep K>4, mag>2  Coronary disease Ischemic cardiomyopathy Hypertension Elevated troponin Troponin represents demand NSTEMI, not acute coronary syndrome.  No ischemic work up planned. -Continue aspirin, statin, Plavix, BB  Diabetes BGs better today. -Continue Lantus -Continue SSI  Chronic normocytic anemia Hgb up  Thrombocytopenia This has resolved.  Alcoholism His cardiology notes suggest that he was abstinent from alcohol from 2017-2019.  Admission note suggests that he had relapsed to some extent.  he has had withdrawal seizures in the past, no mention of DTs.  He is now out of the window for withdrawals.  Precedex has been stopped.  He is not at risk for relapse, in his current dependent state. -Continue folate and thiamine -Continue lorazepam 1 mg per tube PRN for agitation, anxiety  Nutrition Enteral feeding tube in place. -Continue tube feeds -Continue free water, increase -Consult to nutrition  Neck wound From trach. -Consult to [place] -Continue fentanyl 50 mcg q2hrs PRN for pain -Will ask trach team or RT today to design some offloading for his trach  DVT prophylaxis: Heparin Code Status: FULL Family Communication: None present MDM and disposition Plan: The below labs and imaging reports were reviewed and summarized above.    The patient was admitted  with septic shock from community acquired pneumonia versus aspiration pneumonia, complicated by congestive heart failure.  He was treated with antibiotics, his pneumonia has resolved, he was treated with diuretics, his CHF resolved, but his respiratory failure has been slow to wean from ventilator.  We are making initial progress to ventilation.   His renal function actually is starting to gradually improve.  Family have voiced their desire for continued full scope of treatment.  He appears to be a good weaning candidate; In order to maximize his likelihood to wean from ventilator over the next 90 days, at this time, we will initiate LTAC referral.     Consultants:   CCM  Cardiology  WOC  Procedures:   Echo 6/6 LV EF: 20% -   25%  ------------------------------------------------------------------- Indications:      Shock unspecified 785.50.  ------------------------------------------------------------------- History:   PMH:  Hypotension.  ------------------------------------------------------------------- Study Conclusions  - Left ventricle: The cavity size was severely dilated. Systolic   function was severely reduced. The estimated ejection fraction   was in the range of 20% to 25%. Diffuse hypokinesis. The study is   not technically sufficient to allow evaluation of LV diastolic   function. - Mitral valve: Severely calcified annulus. Moderately thickened   leaflets . There was mild regurgitation. - Left atrium: The atrium was mildly dilated.   Right IJ central line 6/5 >>  Intubated 6/5  Trach 6/12   Antimicrobials:   Vancomycin 6/5 >> 6/10  Cefepime 6/5 >> 6/12    Cultures:   6/5 blood cultures x2: NGTD    Subjective: Intubated and sedated, vent on CPAP, 8/4, less agitated again today, neck wound is slightly worse.  No fever, no respiratory distress, no other new nursing concerns.  Objective: Vitals:   10/11/17 0600 10/11/17 0700 10/11/17 0734  10/11/17 0805  BP: (!) 124/58 (!) 127/53  (!) 144/48  Pulse: 83 87  89  Resp: _0 Temp:   98 F (36.7 C)   TempSrc:   Oral   SpO2: 100% 100%  100%  Weight:      Height:        Intake/Output Summary (Last 24 hours) at 10/11/2017 0953 Last data filed at 10/11/2017 0600 Gross per 24 hour  Intake 1433.52 ml  Output 735 ml  Net 698.52 ml   Filed Weights   10/09/17 0326 10/10/17 0327 10/11/17 0347  Weight: 80.5 kg (177 lb 7.5 oz) 80 kg (176 lb 5.9 oz) 82.7 kg (182 lb 5.1 oz)    Examination: General appearance: Thin elderly appearing male on vent via trach  HEENT: Anicteric, conjunctive watery, lips dry, edentulous, oropharynx tacky dry, no oral lesions.  Unable to assess hearing.  There is an unstageable pressure ulcer under his trach collar.  There is no redness surrounding this or induration.  No drainage. Skin: Skin is warm and dry, no suspicious rashes or lesions on the face, neck, chest, arms, legs, abdomen Cardiac: Tachycardic, regular, no murmurs, no lower extremity edema Respiratory: Tachypneic on ventilator on CPAP setting, lungs have some coarse breath sounds bilaterally.  No wheezes. Abdomen: Abdomen soft, no grimace to palpation, no masses appreciated, no distention.   MSK: No deformities or effusions of the large joints of the upper or lower extremities bilaterally. Neuro: Makes eye contact,  moves upper extremities, weakly, but symmetrically.  Extraocular movements intact.     Psych: Unable to assess.    Data Reviewed: I have personally reviewed following labs and imaging studies:  CBC: Recent Labs  Lab 10/07/17 0507 10/08/17 0333 10/09/17 0313 10/10/17 0317 10/11/17 0647  WBC 17.6* 13.6* 13.9* 16.4* 17.0*  NEUTROABS 13.7* 10.1*  --   --   --   HGB 8.5* 8.7* 8.7* 8.6* 9.2*  HCT 26.1* 27.9* 28.2* 27.8* 29.6*  MCV 95.3 97.6 98.6 99.6 97.7  PLT 251 264 256 243 195   Basic Metabolic Panel: Recent Labs  Lab 10/05/17 0346 10/05/17 1643  10/07/17 0507  10/08/17 0333 10/09/17 0313 10/10/17 0317 10/11/17 0647  NA 136 139   < > 138 142 143 146* 147*  K 3.7 3.6   < > 3.3* 3.3* 4.0 3.9 4.1  CL 103 104   < > 105 108 112* 114* 118*  CO2 20* 18*   < > 21* 21* 20* 21* 19*  GLUCOSE 172* 242*   < > 178* 163* 242* 247* 124*  BUN 115* 120*   < > 127* 141* 159* 169* 177*  CREATININE 2.10* 2.23*   < > 2.75* 2.89* 2.95* 2.77* 2.47*  CALCIUM 8.2* 8.5*   < > 8.5* 8.5* 8.2* 8.3* 8.5*  MG 1.6* 1.7  --   --   --  2.9*  --   --    < > = values in this interval not displayed.   GFR: Estimated Creatinine Clearance: 25 mL/min (A) (by C-G formula based on SCr of 2.47 mg/dL (H)). Liver Function Tests: Recent Labs  Lab 10/06/17 1005  AST 59*  ALT 46  ALKPHOS 152*  BILITOT 1.3*  PROT 6.6  ALBUMIN 2.3*   Recent Labs  Lab 10/06/17 1005  LIPASE 25   No results for input(s): AMMONIA in the last 168 hours. Coagulation Profile: No results for input(s): INR, PROTIME in the last 168 hours. Cardiac Enzymes: No results for input(s): CKTOTAL, CKMB, CKMBINDEX, TROPONINI in the last 168 hours. BNP (last 3 results) No results for input(s): PROBNP in the last 8760 hours. HbA1C: No results for input(s): HGBA1C in the last 72 hours. CBG: Recent Labs  Lab 10/10/17 1242 10/10/17 1649 10/10/17 2024 10/10/17 2330 10/11/17 0331  GLUCAP 213* 174* 119* 100* 83   Lipid Profile: No results for input(s): CHOL, HDL, LDLCALC, TRIG, CHOLHDL, LDLDIRECT in the last 72 hours. Thyroid Function Tests: No results for input(s): TSH, T4TOTAL, FREET4, T3FREE, THYROIDAB in the last 72 hours. Anemia Panel: No results for input(s): VITAMINB12, FOLATE, FERRITIN, TIBC, IRON, RETICCTPCT in the last 72 hours. Urine analysis:    Component Value Date/Time   COLORURINE AMBER (A) 09/26/2017 1615   APPEARANCEUR HAZY (A) 10/15/2017 1615   LABSPEC 1.017 10/18/2017 1615   PHURINE 5.0 09/26/2017 1615   GLUCOSEU NEGATIVE 09/26/2017 1615   HGBUR MODERATE (A) 10/07/2017 1615    BILIRUBINUR NEGATIVE 09/27/2017 1615   KETONESUR NEGATIVE 09/24/2017 1615   PROTEINUR 100 (A) 10/06/2017 1615   UROBILINOGEN 1.0 04/02/2012 0204   NITRITE NEGATIVE 10/14/2017 1615   LEUKOCYTESUR NEGATIVE 10/07/2017 1615   Sepsis Labs: _0 (procalcitonin:4,lacticacidven:4)  )No results found for this or any previous visit (from the past 240 hour(s)).       Radiology Studies: No results found.      Scheduled Meds: . aspirin  81 mg Per Tube Daily  . atorvastatin  20 mg Per Tube Daily  . carvedilol  6.25 mg Oral  BID WC  . chlorhexidine gluconate (MEDLINE KIT)  15 mL Mouth Rinse BID  . Chlorhexidine Gluconate Cloth  6 each Topical Q0600  . clopidogrel  75 mg Per Tube Daily  . famotidine  20 mg Per Tube Daily  . feeding supplement (PRO-STAT SUGAR FREE 64)  30 mL Per Tube BID  . folic acid  1 mg Per Tube Daily  . free water  100 mL Per Tube Q8H  . heparin  5,000 Units Subcutaneous Q8H  . insulin aspart  0-9 Units Subcutaneous Q4H  . insulin glargine  8 Units Subcutaneous QHS  . mouth rinse  15 mL Mouth Rinse 10 times per day  . multivitamin with minerals  1 tablet Per Tube Daily  . thiamine  100 mg Per Tube Daily   Continuous Infusions: . sodium chloride Stopped (10/10/17 1121)  . feeding supplement (VITAL AF 1.2 CAL) 1,000 mL (10/10/17 1442)     LOS: 18 days     The patient is critically ill with multiple organ failure of the lungs, kidneys, and heart.  He requires ongoing mechanical ventilation without management which he would die.  Total critical care time is 40 minutes.  Critical care time was exclusive of separately billable procedures and treating other patients.  Critical care time was spent personally by me and the following activities: Discussing treatment plan with nursing, RT, consultants.  Examination of the patient, ordering of lab test, radiographs, pulse oximetry, and reviewing the above and reevaluate patient's condition.      Edwin Dada, MD [facility] 10/11/2017, 9:53 AM     Pager [phone] --- please page though AMION:  [email or web address] Password [email or web address] If 7PM-7AM, please contact night-coverage

## 2017-10-12 DIAGNOSIS — Z7189 Other specified counseling: Secondary | ICD-10-CM

## 2017-10-12 LAB — GLUCOSE, CAPILLARY
GLUCOSE-CAPILLARY: 127 mg/dL — AB (ref 65–99)
GLUCOSE-CAPILLARY: 124 mg/dL — AB (ref 65–99)
GLUCOSE-CAPILLARY: 131 mg/dL — AB (ref 65–99)
Glucose-Capillary: 162 mg/dL — ABNORMAL HIGH (ref 65–99)
Glucose-Capillary: 201 mg/dL — ABNORMAL HIGH (ref 65–99)
Glucose-Capillary: 192 mg/dL — ABNORMAL HIGH (ref 65–99)
Glucose-Capillary: 172 mg/dL — ABNORMAL HIGH (ref 65–99)

## 2017-10-12 LAB — BASIC METABOLIC PANEL
Anion gap: 12 (ref 5–15)
BUN: 179 mg/dL — ABNORMAL HIGH (ref 6–20)
CHLORIDE: 117 mmol/L — AB (ref 101–111)
CO2: 20 mmol/L — AB (ref 22–32)
CREATININE: 2.4 mg/dL — AB (ref 0.61–1.24)
Calcium: 8.3 mg/dL — ABNORMAL LOW (ref 8.9–10.3)
GFR calc non Af Amer: 24 mL/min — ABNORMAL LOW (ref >60)
GFR, EST AFRICAN AMERICAN: 28 mL/min — AB (ref >60)
Glucose, Bld: 186 mg/dL — ABNORMAL HIGH (ref 65–99)
Potassium: 4.2 mmol/L (ref 3.5–5.1)
Sodium: 149 mmol/L — ABNORMAL HIGH (ref 135–145)

## 2017-10-12 LAB — CBC
HCT: 28.9 % — ABNORMAL LOW (ref 39.0–52.0)
Hemoglobin: 9 g/dL — ABNORMAL LOW (ref 13.0–17.0)
MCH: 30.4 pg (ref 26.0–34.0)
MCHC: 31.1 g/dL (ref 30.0–36.0)
MCV: 97.6 fL (ref 78.0–100.0)
PLATELETS: 247 10*3/uL (ref 150–400)
RBC: 2.96 MIL/uL — AB (ref 4.22–5.81)
RDW: 19.6 % — ABNORMAL HIGH (ref 11.5–15.5)
WBC: 18.5 10*3/uL — ABNORMAL HIGH (ref 4.0–10.5)

## 2017-10-12 MED ORDER — FENTANYL CITRATE (PF) 100 MCG/2ML IJ SOLN
100.0000 ug | INTRAMUSCULAR | Status: DC | PRN
  Administered 2017-10-12 – 2017-10-13 (×8): 100 ug via INTRAVENOUS
  Filled 2017-10-12 (×8): qty 2

## 2017-10-12 NOTE — Plan of Care (Signed)
  Problem: Health Behavior/Discharge Planning: Goal: Ability to manage health-related needs will improve Outcome: Progressing Note:  Family to make decisions regarding further care 6/24.    Problem: Pain Managment: Goal: General experience of comfort will improve Outcome: Progressing Note:  Patient still in very great amount of pain. Grimacing frequently with any touch.  Patient appears to be sufferring. Only relief is when he is given Fentanyl.    Problem: Clinical Measurements: Goal: Cardiovascular complication will be avoided Outcome: Not Progressing Note:  Patient with frequent PVC's.

## 2017-10-12 NOTE — Progress Notes (Signed)
Physical Therapy Discharge Patient Details Name: Johnny [name] MRN: [id number] DOB: 20-Oct-1934 Today's Date: 10/12/2017 Time: [id number] PT Time Calculation (min) (ACUTE ONLY): 13 min  Patient discharged from PT services secondary to medical decline - will need to re-order PT to resume therapy services. Pt with significant pain with any movement or mobility despite scheduled pain  meds.  Please see latest therapy progress note for current level of functioning and progress toward goals.    Progress and discharge plan discussed with patient and/or caregiver: Patient unable to participate in discharge planning and no caregivers available  Johnny [name]     Johnny [name] 10/12/2017, 3:50 PM  [facility] [name] PT [phone]

## 2017-10-12 NOTE — Progress Notes (Signed)
Physical Therapy Treatment Patient Details Name: Johnny NewcomerDan E [name] MRN: [id number] DOB: [birth year] Today's Date: 10/12/2017    History of Present Illness 83 yo male former smoker with respiratory failure and altered mental status after alcohol binge.  Found to have pneumonia, septic shock and intubated in ER. Tracheostomy 6/12    PT Comments    Pt with significant pain with any movement including passive range of motion. Pt receiving regular pain meds. At this point don't feel pt can tolerate PT. Will sign off.    Follow Up Recommendations  LTACH     Equipment Recommendations  None recommended by PT    Recommendations for Other Services       Precautions / Restrictions Precautions Precautions: Fall Restrictions Weight Bearing Restrictions: No    Mobility  Bed Mobility                  Transfers                    Ambulation/Gait                 Stairs             Wheelchair Mobility    Modified Rankin (Stroke Patients Only)       Balance                                            Cognition Arousal/Alertness: Awake/alert Behavior During Therapy: WFL for tasks assessed/performed Overall Cognitive Status: Difficult to assess                                        Exercises General Exercises - Upper Extremity Shoulder Flexion: 5 reps;PROM;Left(only minimal movement tolerated due to pain) Elbow Flexion: Both;PROM;5 reps;Supine Elbow Extension: AROM;Both;10 reps General Exercises - Lower Extremity Ankle Circles/Pumps: Both;PROM;5 reps Heel Slides: PROM;Both;5 reps;Supine(only minimal movement tolerated due to pain) Hip ABduction/ADduction: PROM;Both;5 reps    General Comments        Pertinent Vitals/Pain Pain Assessment: Faces Faces Pain Scale: Hurts whole lot Pain Location: generalized with any movement Pain Descriptors / Indicators: Grimacing Pain Intervention(s): Limited activity within  patient's tolerance;Monitored during session;Repositioned;Premedicated before session    Home Living                      Prior Function            PT Goals (current goals can now be found in the care plan section) Progress towards PT goals: Not progressing toward goals - comment    Frequency           PT Plan Discharge plan needs to be updated    Co-evaluation              AM-PAC PT "6 Clicks" Daily Activity  Outcome Measure  Difficulty turning over in bed (including adjusting bedclothes, sheets and blankets)?: Unable Difficulty moving from lying on back to sitting on the side of the bed? : Unable Difficulty sitting down on and standing up from a chair with arms (e.g., wheelchair, bedside commode, etc,.)?: Unable Help needed moving to and from a bed to chair (including a wheelchair)?: Total Help needed walking in [name] room?: Total Help needed climbing 3-5 steps with a railing? :  Total 6 Click Score: 6    End of Session   Activity Tolerance: Patient limited by pain Patient left: in bed;with call bell/phone within reach;with bed alarm set;with restraints reapplied Nurse Communication: Other (comment)(PT signing off due to pt unable to tolerate due to pain) PT Visit Diagnosis: Other abnormalities of gait and mobility (R26.89);Muscle weakness (generalized) (M62.81);Difficulty in walking, not elsewhere classified (R26.2);Pain Pain - part of body: (generalized)     Time: 5784-6962 PT Time Calculation (min) (ACUTE ONLY): 13 min  Charges:  $Therapeutic Exercise: 8-22 mins                    G Codes:       Johnny [name] PT [phone]    Johnny [name] Johnny [name] 10/12/2017, 3:49 PM

## 2017-10-12 NOTE — Procedures (Signed)
Tracheostomy tube change: IThe old  #6 cuffed trach was carefully removed. the tracheostomy site appeared: large unstagable ulceration at 7o'clock  In relation to stoma. A new #  7 Cuffed portex trach was easily placed in the tracheostomy stoma and secured with velcro trach ties. The tracheostomy was patent, good color change observed via EZ-CAP, and the patient was easily able to voice with finger occlusion and tolerated the procedure well with no immediate complications.   The pressure ulcer was re-dressed. This seemed to help get the pressure off from the ulcer site. My hope is that w/ this trach flange being softer and smaller we can facilitate wound healing   Simonne MartinetPeter E Amorina Doerr ACNP-BC [facility] Pager # [phone] OR # [phone] if no answer

## 2017-10-12 NOTE — Consult Note (Signed)
WOC Nurse wound consult note This is a follow up visit with [facility] in the presence and with the thorough input from the patient's primary RN.  The pressure areas over the right eye and related to prior placement of the pulse ox probe number 3.  All are hypopigmented and receiving vaseline and a foam dressing.  Over the left eye, and also related to prior placement of the pulse ox probe, there are 3 round and one "L" shaped hyperpigmented areas.  These are also being treated with vaseline and a foam dressing.  The primary RN states all of these areas are improved.  The patient had a [employer]plic[employer] hydrocolloid dressing beneath the trach face plate.  It was removed to reveal a highly exudating, yellow/brown slough covered infra-trach face plate wound that is malodorous.  The wound measures 2 cm x 3.5 cm x unknown depth.  The skin tissue below the wound is discolored a maroon hued color.  I am concerned the patient has an abscess related to the site.  In my opinion, this wound needs medical evaluation and exploration; possibly involving the team that performed the tracheostomy initially, or an ENT specialist.  I defer further treatment and care to a prescribing provider for care of this complex area. Additionally, there is a medical device related pressure injury to the right nare that measures 0.4 cm x 0.3 cm and is 50/50 pink and yellow.  It appears related to the small bore feeding tube and the associated securement "bridle".  I cannot think of a dressing to place on this.  Management includes on-going monitoring and ensuring the tube or bridle is not pressing to any one area for an extended period of time. Monitor the wound area(s) for worsening of condition such as: Signs/symptoms of infection,  Increase in size,  Development of or worsening of odor, Development of pain, or increased pain at the affected locations.  Notify the medical team if any of these develop.  Helmut MusterSherry Jeidy Hoerner, RN,  MSN, CWOCN, CNS-BC, pager [phone]

## 2017-10-12 NOTE — Progress Notes (Signed)
SLP followed up as part of trach team consult. Per RN, patient intermittently alert but in significant pain this AM requiring scheduled pain medication. Noted patient beginning to wean from vent. If patient remains alert and attempting to communicate, may be appropriate for in-line PMSV. Will continue to follow along.   Ferdinand LangoLeah Branston Halsted MA, CCC-SLP [phone]

## 2017-10-12 NOTE — Progress Notes (Signed)
PROGRESS NOTE    Johnny [name]  IRS:[id number] DOB: [birth year] DOA: 10/14/2017 PCP: Marletta Lor, MD      Brief Narrative:  Johnny [name] is a 83 y.o. M with CHF EF 40%, isch CM s/p CABG in 2003, NIDDM, HTN, alcoholism and parox atrial fibrillation not on warfarin but s/p Ablation in 2009 who presented with septic shock presumed respiratory source, aspiration vs CAP.  Per admission H&P, he is now back to being mostly wheelchair bound, had been in the midst of a several day alcohol binge again when he started coughin, then on the day of admission, developed sudden severe respiratory distress.  When he arrived to ED he was desaturating to 50%, required intubation promptly, then started on pressors and admitted to ICU.       Assessment & Plan:  Acute hypoxic respiratory failure Multifocal community acquired pneumonia versus aspiration pneumonia Pleural effusions Hypoxic respiratory failure largely from pneumonia, unclear if this was community-acquired versus aspiration.  He did complete 8 days cefepime, 6 days vancomycin.  No further fever.  Respiratory failure was complicated by congestive heart failure for which he required IV diuresis, also completed.  Has been slow to wean from ventilator, required tracheostomy, but slowly weaning now.    CXR from yesterday about the same by my read, Radiology think bilateral effusions slightly larger, no new infiltrates. Tolerating CPAP again today, 8/5, did 10 hours yesterday.  Night time on Pcontrol, 20/5 40%. -Continue full mechanical ventilation at night with CPAP trials during the day. -Consult to Palliative Care -Continue famotidine -Continue restraints  Goals of care Renal function worsening, but anemia stable, thrombocytopenia resolved, CHF appears euvolemic, and tolerated last two days of pressure support well.  Also, he appears alert to me, follows most commands.     Acute on chronic systolic CHF EF previously 40%, last cath 2018, no  targets.  Now EF 20-25%.  K and mag normal. -Hold Lasix -Daily weights -Continue carvedilol -Keep K>4, mag>2 -Call Cardiology at d/c for discharge follow up with [name]  Acute on chronic kidney failure stage III Baseline Cr 1.2-1.3.  Admitted with Cr 1.7, improved to baseline, then worsening, peaked around 2.7.  Diuresis stopped and now Cr slowly steadily improving.   -Daily BMP  Hypernatremia Worse again today -Continue Free water 200 q8 -Daily BMP  Recurrence of atrial fibrillation Multifocal atrial tachycardia CHADS2Vasc 8, but had been deemed not candidate for anticoagulation in setting of active alcohol abuse.   -Continue carvedilol -Will readdress anticoagulation with family  Ventricular tachycardia None more. -BMP daily -Continue carvedilol -Keep K>4, mag>2  Coronary disease Ischemic cardiomyopathy Hypertension Elevated troponin Troponin represents demand NSTEMI, not acute coronary syndrome.  No ischemic work up planned. -Continue aspirin, statin, BB, Plavix  Diabetes Slightly better. -Continue lantus  -Continue SSI  Chronic normocytic anemia Hgb stable  Thrombocytopenia This has resolved.  Alcoholism His cardiology notes suggest that he was abstinent from alcohol from 2017-2019.  Admission note suggests that he had relapsed to some extent.  he has had withdrawal seizures in the past, no mention of DTs.  He is now out of the window for withdrawals.  Precedex has been stopped.  He is not at risk for relapse, in his current dependent state. -Continue folate and thiamine -Continue lorazepam 1 mg per tube PRN for agitation, anxiety  Nutrition Enteral feeding tube in place. -Continue tube feeds -Continue free water -Consult to nutrition  Neck wound From trach.  RT yesterday readdressed dressing, to offload  wound. -Consult to Roy -Continue fentanyl 50 mcg q2hrs PRN for pain       DVT prophylaxis: Heparin Code Status: FULL Family Communication:  None present MDM and disposition Plan: Below labs and imaging reports were reviewed and summarized above.  The patient was admitted with septic shock from community-acquired pneumonia or aspiration pneumonia, which was complicated by congestive heart failure.  He was treated with antibiotics, and his pneumonia has resolved.  He was treated with diuretics, his CHF is resolved.  His respiratory failure has been slow to wean from the ventilator.  We are making initial progress to weaning ventilation.  His renal function at this point starting to gradually improve.  Family have voiced their desire for continued full scope of treatment, and he appears to be suitable weaning candidate although this will take considerable time, and would be ideally performed in an LTAC setting.  In order to maximize his likelihood of to wean from ventilator over the next 90 days, at this time we will initiate LTAC referral.      Consultants:   CCM  Cardiology  WOC  Procedures:   Echo 6/6 LV EF: 20% -   25%  ------------------------------------------------------------------- Indications:      Shock unspecified 785.50.  ------------------------------------------------------------------- History:   PMH:  Hypotension.  ------------------------------------------------------------------- Study Conclusions  - Left ventricle: The cavity size was severely dilated. Systolic   function was severely reduced. The estimated ejection fraction   was in the range of 20% to 25%. Diffuse hypokinesis. The study is   not technically sufficient to allow evaluation of LV diastolic   function. - Mitral valve: Severely calcified annulus. Moderately thickened   leaflets . There was mild regurgitation. - Left atrium: The atrium was mildly dilated.   Right IJ central line 6/5 >>  Intubated 6/5  Trach 6/12   Antimicrobials:   Vancomycin 6/5 >> 6/10  Cefepime 6/5 >> 6/12    Cultures:   6/5 blood cultures x2:  NGTD    Subjective: Bated and sedated, vent on CPAP again, 8/4.  Asking for pain medicine regularly.  Appears to be in significant pain with any movement.  No fever, respiratory distress per nursing.  Objective: Vitals:   10/12/17 0500 10/12/17 0600 10/12/17 0731 10/12/17 0818  BP: 99/85 (!) 101/59  115/85  Pulse: 86 75  91  Resp: (!) 22 20    Temp:   98.3 F (36.8 C)   TempSrc:   Oral   SpO2: 100% 100%    Weight:      Height:        Intake/Output Summary (Last 24 hours) at 10/12/2017 0850 Last data filed at 10/12/2017 0600 Gross per 24 hour  Intake 1965 ml  Output 1110 ml  Net 855 ml   Filed Weights   10/10/17 0327 10/11/17 0347 10/12/17 0312  Weight: 80 kg (176 lb 5.9 oz) 82.7 kg (182 lb 5.1 oz) 82 kg (180 lb 12.4 oz)    Examination: General appearance: Thin elderly male, on vent via trach, eyes closed, opens eyes to voice HEENT: Anicteric, conjunctival watery, lips dry, edentulous, oropharynx tacky dry, no oral lesions.  There is an unstageable pressure eraser under his trach collar.  There is no redness surrounding this or induration.  No drainage.  Some mild watery drainage from his trach Skin: Skin is warm and dry, no suspicious rashes or lesions on the face, neck, chest, arms, legs, abdomen. Cardiac: Tachycardic, regular, no murmurs, no lower extremity edema at all. Respiratory:  Tachypneic on CPAP ventilator, lungs have some coarse breath sounds bilaterally again, no wheezing. Abdomen: Abdomen tender diffusely, mildly distended, no masses appreciated no ascites.   MSK: No deformities or effusions of the large joints of the upper or lower extremities bilaterally. Neuro: Makes eye contact, nods head yes or no.  Moves upper extremities weakly, but admits.  Extraocular movements intact    Psych: Unable to assess.    Data Reviewed: I have personally reviewed following labs and imaging studies:  CBC: Recent Labs  Lab 10/07/17 0507 10/08/17 0333 10/09/17 0313  10/10/17 0317 10/11/17 0647 10/12/17 0344  WBC 17.6* 13.6* 13.9* 16.4* 17.0* 18.5*  NEUTROABS 13.7* 10.1*  --   --   --   --   HGB 8.5* 8.7* 8.7* 8.6* 9.2* 9.0*  HCT 26.1* 27.9* 28.2* 27.8* 29.6* 28.9*  MCV 95.3 97.6 98.6 99.6 97.7 97.6  PLT 251 264 256 243 232 161   Basic Metabolic Panel: Recent Labs  Lab 10/05/17 1643  10/08/17 0333 10/09/17 0313 10/10/17 0317 10/11/17 0647 10/12/17 0344  NA 139   < > 142 143 146* 147* 149*  K 3.6   < > 3.3* 4.0 3.9 4.1 4.2  CL 104   < > 108 112* 114* 118* 117*  CO2 18*   < > 21* 20* 21* 19* 20*  GLUCOSE 242*   < > 163* 242* 247* 124* 186*  BUN 120*   < > 141* 159* 169* 177* 179*  CREATININE 2.23*   < > 2.89* 2.95* 2.77* 2.47* 2.40*  CALCIUM 8.5*   < > 8.5* 8.2* 8.3* 8.5* 8.3*  MG 1.7  --   --  2.9*  --   --   --    < > = values in this interval not displayed.   GFR: Estimated Creatinine Clearance: 25.8 mL/min (A) (by C-G formula based on SCr of 2.4 mg/dL (H)). Liver Function Tests: Recent Labs  Lab 10/06/17 1005  AST 59*  ALT 46  ALKPHOS 152*  BILITOT 1.3*  PROT 6.6  ALBUMIN 2.3*   Recent Labs  Lab 10/06/17 1005  LIPASE 25   No results for input(s): AMMONIA in the last 168 hours. Coagulation Profile: No results for input(s): INR, PROTIME in the last 168 hours. Cardiac Enzymes: No results for input(s): CKTOTAL, CKMB, CKMBINDEX, TROPONINI in the last 168 hours. BNP (last 3 results) No results for input(s): PROBNP in the last 8760 hours. HbA1C: No results for input(s): HGBA1C in the last 72 hours. CBG: Recent Labs  Lab 10/11/17 1542 10/11/17 1946 10/11/17 2306 10/12/17 0418 10/12/17 0729  GLUCAP 170* 73 92 162* 201*   Lipid Profile: No results for input(s): CHOL, HDL, LDLCALC, TRIG, CHOLHDL, LDLDIRECT in the last 72 hours. Thyroid Function Tests: No results for input(s): TSH, T4TOTAL, FREET4, T3FREE, THYROIDAB in the last 72 hours. Anemia Panel: No results for input(s): VITAMINB12, FOLATE, FERRITIN, TIBC, IRON,  RETICCTPCT in the last 72 hours. Urine analysis:    Component Value Date/Time   COLORURINE AMBER (A) 10/08/2017 1615   APPEARANCEUR HAZY (A) 09/28/2017 1615   LABSPEC 1.017 10/06/2017 1615   PHURINE 5.0 10/02/2017 1615   GLUCOSEU NEGATIVE 09/26/2017 1615   HGBUR MODERATE (A) 10/10/2017 1615   BILIRUBINUR NEGATIVE 10/06/2017 1615   KETONESUR NEGATIVE 10/07/2017 1615   PROTEINUR 100 (A) 09/20/2017 1615   UROBILINOGEN 1.0 04/02/2012 0204   NITRITE NEGATIVE 09/22/2017 1615   LEUKOCYTESUR NEGATIVE 10/10/2017 1615   Sepsis Labs: '@LABRCNTIP' (procalcitonin:4,lacticacidven:4)  )No results found for this  or any previous visit (from the past 240 hour(s)).       Radiology Studies: Dg Chest Port 1 View  Result Date: 10/11/2017 CLINICAL DATA:  Respiratory distress. EXAM: PORTABLE CHEST 1 VIEW COMPARISON:  10/08/2017 and older exams FINDINGS: There is increased lung base opacity when compared to the most recent prior exam, most likely due to an increase in pleural fluid. Remainder of the lungs is clear. No evidence of pulmonary edema. No pneumothorax. Stable changes from prior CABG surgery. Tracheostomy tube and enteric feeding tube are stable and well positioned. IMPRESSION: 1. Interval increase in lung base opacity consistent with increased pleural effusions. No evidence of pulmonary edema. No other change. Electronically Signed   By: Lajean Manes M.D.   On: 10/11/2017 15:58        Scheduled Meds: . aspirin  81 mg Per Tube Daily  . atorvastatin  20 mg Per Tube Daily  . carvedilol  6.25 mg Oral BID WC  . chlorhexidine gluconate (MEDLINE KIT)  15 mL Mouth Rinse BID  . Chlorhexidine Gluconate Cloth  6 each Topical Q0600  . clopidogrel  75 mg Per Tube Daily  . famotidine  20 mg Per Tube Daily  . feeding supplement (PRO-STAT SUGAR FREE 64)  30 mL Per Tube BID  . folic acid  1 mg Per Tube Daily  . free water  200 mL Per Tube Q8H  . heparin  5,000 Units Subcutaneous Q8H  . insulin aspart   0-9 Units Subcutaneous Q4H  . insulin glargine  8 Units Subcutaneous QHS  . mouth rinse  15 mL Mouth Rinse 10 times per day  . multivitamin with minerals  1 tablet Per Tube Daily  . thiamine  100 mg Per Tube Daily   Continuous Infusions: . sodium chloride Stopped (10/10/17 1121)  . feeding supplement (VITAL AF 1.2 CAL) 1,000 mL (10/11/17 1224)     LOS: 19 days      The patient is critically ill with multiple organ failure of the lungs, kidneys, and heart.  He requires ongoing mechanical ventilation without management of which he would die.  Total critical care time is 30 minutes.  Critical care time was exclusive of separately billable procedures and treating other patients.  Critical care time spent personally by me during the following activities, discussing treatment plan with nursing, respiratory therapy, and consultants.  Discussing prognosis and plan of care with family.  Examination of the patient, ordering lab tests, radiographs, pulse oximetry, and personal interpretation of the above.  As well as reviewing and reevaluating the patient's condition.    Edwin Dada, MD [facility] 10/12/2017, 8:50 AM     Pager [phone] --- please page though AMION:  [email or web address] If 7PM-7AM, please contact night-coverage

## 2017-10-13 LAB — CBC
HCT: 28.2 % — ABNORMAL LOW (ref 39.0–52.0)
Hemoglobin: 9 g/dL — ABNORMAL LOW (ref 13.0–17.0)
MCH: 30.8 pg (ref 26.0–34.0)
MCHC: 31.9 g/dL (ref 30.0–36.0)
MCV: 96.6 fL (ref 78.0–100.0)
PLATELETS: 242 10*3/uL (ref 150–400)
RBC: 2.92 MIL/uL — ABNORMAL LOW (ref 4.22–5.81)
RDW: 19.9 % — AB (ref 11.5–15.5)
WBC: 15.3 10*3/uL — AB (ref 4.0–10.5)

## 2017-10-13 LAB — BASIC METABOLIC PANEL
Anion gap: 10 (ref 5–15)
BUN: 183 mg/dL — AB (ref 8–23)
CHLORIDE: 119 mmol/L — AB (ref 98–111)
CO2: 18 mmol/L — ABNORMAL LOW (ref 22–32)
CREATININE: 2.28 mg/dL — AB (ref 0.61–1.24)
Calcium: 8.3 mg/dL — ABNORMAL LOW (ref 8.9–10.3)
GFR calc Af Amer: 29 mL/min — ABNORMAL LOW (ref >60)
GFR calc non Af Amer: 25 mL/min — ABNORMAL LOW (ref >60)
GLUCOSE: 175 mg/dL — AB (ref 70–99)
Potassium: 4 mmol/L (ref 3.5–5.1)
Sodium: 147 mmol/L — ABNORMAL HIGH (ref 135–145)

## 2017-10-13 LAB — GLUCOSE, CAPILLARY
Glucose-Capillary: 151 mg/dL — ABNORMAL HIGH (ref 70–99)
Glucose-Capillary: 188 mg/dL — ABNORMAL HIGH (ref 70–99)
Glucose-Capillary: 270 mg/dL — ABNORMAL HIGH (ref 70–99)
Glucose-Capillary: 229 mg/dL — ABNORMAL HIGH (ref 70–99)
Glucose-Capillary: 202 mg/dL — ABNORMAL HIGH (ref 70–99)
Glucose-Capillary: 234 mg/dL — ABNORMAL HIGH (ref 70–99)
Glucose-Capillary: 213 mg/dL — ABNORMAL HIGH (ref 70–99)

## 2017-10-13 MED ORDER — FENTANYL CITRATE (PF) 100 MCG/2ML IJ SOLN
50.0000 ug | INTRAMUSCULAR | Status: DC | PRN
  Administered 2017-10-13 – 2017-10-25 (×33): 50 ug via INTRAVENOUS
  Filled 2017-10-13 (×34): qty 2

## 2017-10-13 MED ORDER — FENTANYL 25 MCG/HR TD PT72
50.0000 ug | MEDICATED_PATCH | TRANSDERMAL | Status: DC
  Administered 2017-10-13 – 2017-10-22 (×4): 50 ug via TRANSDERMAL
  Filled 2017-10-13 (×4): qty 2

## 2017-10-13 NOTE — Progress Notes (Signed)
PROGRESS NOTE    Johnny [name]  ONG:[id number] DOB: 1935-01-05 DOA: 10/11/2017 PCP: Marletta Lor, MD      Brief Narrative:  Johnny [name] is a 83 y.o. M with CHF EF 40%, isch CM s/p CABG in 2003, NIDDM, HTN, alcoholism and parox atrial fibrillation not on warfarin but s/p Ablation in 2009 who presented with septic shock presumed respiratory source, aspiration vs CAP.  Per admission H&P, he is now back to being mostly wheelchair bound, had been in the midst of a several day alcohol binge again when he started coughin, then on the day of admission, developed sudden severe respiratory distress.  When he arrived to ED he was desaturating to 50%, required intubation promptly, then started on pressors and admitted to ICU.       Assessment & Plan:  Acute hypoxic respiratory failure Multifocal community acquired pneumonia versus aspiration pneumonia Pleural effusions Hypoxic respiratory failure largely from pneumonia, unclear if this was community-acquired versus aspiration.  He did complete 8 days cefepime, 6 days vancomycin.  No further fever.  Respiratory failure was complicated by congestive heart failure for which he required IV diuresis, also completed.  Has been slow to wean from ventilator, required tracheostomy, but slowly weaning now.    Tolerated weaning well yesterday.  No respiratory distress overnight.  On pressure control overnight, will plan for CPAP trial again today.    -Again continue full mechanical ventilation at night with CPAP trials during the day -Consult to Palliative Care -Continue famotidine -We will try to see how he does if we discontinue restraints today  Acute on chronic systolic CHF EF previously 40%, last cath 2018, no targets.  Now EF 20-25%.  K and mag normal. -Daily weights, strict intake and output -Hold Lasix -Continue carvedilol  -Keep K>4, mag>2 -Call Cardiology at d/c for discharge follow up with [name]  Acute on chronic kidney failure  stage III Baseline Cr 1.2-1.3.  Admitted with Cr 1.7, improved to baseline, then worsening, peaked around 2.9.  Diuresis stopped and steadily improving, although BUN is rising actually.   -Daily BMP  Hypernatremia Sodium slightly better today -Continue free water 200 every 8 hours -Daily BMP  Recurrence of atrial fibrillation Multifocal atrial tachycardia CHADS2Vasc 8, but had been deemed not candidate for anticoagulation in setting of active alcohol abuse.   -Continue carvedilol -Will readdress anticoagulation with family  Ventricular tachycardia None more. -BMP daily -Continue carvedilol -Keep K>4, mag>2  Coronary disease Ischemic cardiomyopathy Hypertension Elevated troponin Troponin represents demand NSTEMI, not acute coronary syndrome.  No ischemic work up planned. -Continue aspirin, statin, beta-blocker, Plavix  Diabetes Slightly better. -Continue Lantus -Continue SSI  Chronic normocytic anemia Hgb stable  Thrombocytopenia This has resolved.  Alcoholism His cardiology notes suggest that he was abstinent from alcohol from 2017-2019.  Admission note suggests that he had relapsed to some extent.  he has had withdrawal seizures in the past, no mention of DTs.  He is now out of the window for withdrawals.  Precedex has been stopped.  He is not at risk for relapse, in his current dependent state. -Continue folate and thiamine -Continue lorazepam 1 mg per tube PRN for agitation, anxiety  Nutrition Enteral feeding tube in place. -Continue tube feeds -Continue free water -Consult to nutrition  Neck wound From trach.  RT yesterday readdressed dressing, to offload wound.  Fentanyl had to be titrated up yesterday afternoon, is being given around-the-clock.  For the wound, there is no induration or redness around this,  this was replaced with a Portex trach yesterday. -Consult to [facility] -Start fentanyl 50 mcg patch - Otherwise continue fentanyl 50 mcg IV every 2 hours as  needed for breakthrough pain        DVT prophylaxis: Heparin Code Status: FULL Family Communication: None present MDM and disposition Plan: The below labs and imaging reports were reviewed and summarized above.     The patient was admitted with septic shock from community-acquired pneumonia or aspiration pneumonia, which was complicated by congestive heart failure.  He was treated with antibiotics, and his pneumonia has resolved.  He was treated with diuretics, his CHF is resolved.  His respiratory failure has been slow to wean from the ventilator.  We are making initial progress to weaning ventilation.  The patient's renal function at this point continues to gradually improve, he is making good urine output.  He has had no worsening of clinical congestive heart failure.  Family has voiced desire for continued fullscope treatment, and he appears now at this point to be stable suitable candidate for weaning, which would ideally be performed in an LTAC setting.  In order to maximize his likelihood of a wean from ventilator over the next 90 days, at this time we recommend LTAC referral.       Consultants:   CCM  Cardiology  WOC  Procedures:   Echo 6/6 LV EF: 20% -   25%  ------------------------------------------------------------------- Indications:      Shock unspecified 785.50.  ------------------------------------------------------------------- History:   PMH:  Hypotension.  ------------------------------------------------------------------- Study Conclusions  - Left ventricle: The cavity size was severely dilated. Systolic   function was severely reduced. The estimated ejection fraction   was in the range of 20% to 25%. Diffuse hypokinesis. The study is   not technically sufficient to allow evaluation of LV diastolic   function. - Mitral valve: Severely calcified annulus. Moderately thickened   leaflets . There was mild regurgitation. - Left atrium: The atrium was  mildly dilated.   Right IJ central line 6/5 >>  Intubated 6/5  Trach 6/12   Antimicrobials:   Vancomycin 6/5 >> 6/10  Cefepime 6/5 >> 6/12    Cultures:   6/5 blood cultures x2: NGTD    Subjective: Still intubated and sedated, on vent, still on full control this morning.  Appears to be in a lot of pain, pain medicines being given around the clock.  No new fever, respiratory distress, induration swelling, discharge from his neck wound, vomiting, improvement in mentation.  Objective: Vitals:   10/13/17 0700 10/13/17 0717 10/13/17 0800 10/13/17 0809  BP: 132/79  111/61 111/61  Pulse: 91 89 77   Resp: (!) 22 (!) 25 20   Temp:  98.1 F (36.7 C)    TempSrc:  Oral    SpO2: 100% 100% 100% 100%  Weight:      Height:        Intake/Output Summary (Last 24 hours) at 10/13/2017 0858 Last data filed at 10/13/2017 0800 Gross per 24 hour  Intake 2040 ml  Output 1160 ml  Net 880 ml   Filed Weights   10/11/17 0347 10/12/17 0312 10/13/17 0234  Weight: 82.7 kg (182 lb 5.1 oz) 82 kg (180 lb 12.4 oz) 83.3 kg (183 lb 10.3 oz)    Examination: General appearance: Trach, eyes closed, opens eyes to voice, goes back to sleep. HEENT: Anicteric, conjunctival watery, lips dry, edentulous, oropharynx tacky dry, no oral lesions.  There is an unstageable pressure ulcer under his trach collar.  There is no redness surrounding this or induration.  Mild drainage, nothing can be expressed. Skin: Skin is warm and dry, without suspicious rashes or lesions on the face, neck, chest, arms, legs, abdomen. Cardiac: Tachycardic, regular, no murmurs, no lower extremity edema. Respiratory: On ventilator, breath sounds clear.   Abdomen: Soft, no distention, no tenderness to palpation or grimace to palpation.   MSK: No deformities or effusions of the large joints of the upper or lower extremities bilaterally. Neuro: Opens eyes to touch, then closes eyes again.  Does not answer questions.  Does not move arms to  command.    Psych: Unable to assess.   Data Reviewed: I have personally reviewed following labs and imaging studies:  CBC: Recent Labs  Lab 10/07/17 0507 10/08/17 0333 10/09/17 0313 10/10/17 0317 10/11/17 0647 10/12/17 0344 10/13/17 0311  WBC 17.6* 13.6* 13.9* 16.4* 17.0* 18.5* 15.3*  NEUTROABS 13.7* 10.1*  --   --   --   --   --   HGB 8.5* 8.7* 8.7* 8.6* 9.2* 9.0* 9.0*  HCT 26.1* 27.9* 28.2* 27.8* 29.6* 28.9* 28.2*  MCV 95.3 97.6 98.6 99.6 97.7 97.6 96.6  PLT 251 264 256 243 232 247 774   Basic Metabolic Panel: Recent Labs  Lab 10/09/17 0313 10/10/17 0317 10/11/17 0647 10/12/17 0344 10/13/17 0311  NA 143 146* 147* 149* 147*  K 4.0 3.9 4.1 4.2 4.0  CL 112* 114* 118* 117* 119*  CO2 20* 21* 19* 20* 18*  GLUCOSE 242* 247* 124* 186* 175*  BUN 159* 169* 177* 179* 183*  CREATININE 2.95* 2.77* 2.47* 2.40* 2.28*  CALCIUM 8.2* 8.3* 8.5* 8.3* 8.3*  MG 2.9*  --   --   --   --    GFR: Estimated Creatinine Clearance: 27.1 mL/min (A) (by C-G formula based on SCr of 2.28 mg/dL (H)). Liver Function Tests: Recent Labs  Lab 10/06/17 1005  AST 59*  ALT 46  ALKPHOS 152*  BILITOT 1.3*  PROT 6.6  ALBUMIN 2.3*   Recent Labs  Lab 10/06/17 1005  LIPASE 25   No results for input(s): AMMONIA in the last 168 hours. Coagulation Profile: No results for input(s): INR, PROTIME in the last 168 hours. Cardiac Enzymes: No results for input(s): CKTOTAL, CKMB, CKMBINDEX, TROPONINI in the last 168 hours. BNP (last 3 results) No results for input(s): PROBNP in the last 8760 hours. HbA1C: No results for input(s): HGBA1C in the last 72 hours. CBG: Recent Labs  Lab 10/12/17 1515 10/12/17 1918 10/12/17 2326 10/13/17 0311 10/13/17 0714  GLUCAP 124* 131* 172* 151* 188*   Lipid Profile: No results for input(s): CHOL, HDL, LDLCALC, TRIG, CHOLHDL, LDLDIRECT in the last 72 hours. Thyroid Function Tests: No results for input(s): TSH, T4TOTAL, FREET4, T3FREE, THYROIDAB in the last 72  hours. Anemia Panel: No results for input(s): VITAMINB12, FOLATE, FERRITIN, TIBC, IRON, RETICCTPCT in the last 72 hours. Urine analysis:    Component Value Date/Time   COLORURINE AMBER (A) 10/13/2017 1615   APPEARANCEUR HAZY (A) 10/13/2017 1615   LABSPEC 1.017 10/18/2017 1615   PHURINE 5.0 09/30/2017 1615   GLUCOSEU NEGATIVE 10/08/2017 1615   HGBUR MODERATE (A) 10/14/2017 1615   BILIRUBINUR NEGATIVE 09/27/2017 1615   KETONESUR NEGATIVE 09/22/2017 1615   PROTEINUR 100 (A) 09/28/2017 1615   UROBILINOGEN 1.0 04/02/2012 0204   NITRITE NEGATIVE 10/18/2017 1615   LEUKOCYTESUR NEGATIVE 10/05/2017 1615   Sepsis Labs: '@LABRCNTIP' (procalcitonin:4,lacticacidven:4)  )No results found for this or any previous visit (from the past 240 hour(s)).  Radiology Studies: Dg Chest Port 1 View  Result Date: 10/11/2017 CLINICAL DATA:  Respiratory distress. EXAM: PORTABLE CHEST 1 VIEW COMPARISON:  10/08/2017 and older exams FINDINGS: There is increased lung base opacity when compared to the most recent prior exam, most likely due to an increase in pleural fluid. Remainder of the lungs is clear. No evidence of pulmonary edema. No pneumothorax. Stable changes from prior CABG surgery. Tracheostomy tube and enteric feeding tube are stable and well positioned. IMPRESSION: 1. Interval increase in lung base opacity consistent with increased pleural effusions. No evidence of pulmonary edema. No other change. Electronically Signed   By: Lajean Manes M.D.   On: 10/11/2017 15:58        Scheduled Meds: . aspirin  81 mg Per Tube Daily  . atorvastatin  20 mg Per Tube Daily  . carvedilol  6.25 mg Oral BID WC  . chlorhexidine gluconate (MEDLINE KIT)  15 mL Mouth Rinse BID  . clopidogrel  75 mg Per Tube Daily  . famotidine  20 mg Per Tube Daily  . feeding supplement (PRO-STAT SUGAR FREE 64)  30 mL Per Tube BID  . fentaNYL  50 mcg Transdermal Q72H  . folic acid  1 mg Per Tube Daily  . free water  200 mL  Per Tube Q8H  . heparin  5,000 Units Subcutaneous Q8H  . insulin aspart  0-9 Units Subcutaneous Q4H  . insulin glargine  8 Units Subcutaneous QHS  . mouth rinse  15 mL Mouth Rinse 10 times per day  . multivitamin with minerals  1 tablet Per Tube Daily  . thiamine  100 mg Per Tube Daily   Continuous Infusions: . sodium chloride Stopped (10/10/17 1121)  . feeding supplement (VITAL AF 1.2 CAL) 1,000 mL (10/13/17 0137)     LOS: 20 days         Edwin Dada, MD [facility] 10/13/2017, 8:57 AM     Pager [phone] --- please page though AMION:  [email or web address] Password [email or web address] If 7PM-7AM, please contact night-coverage

## 2017-10-13 NOTE — Plan of Care (Signed)
  Problem: Clinical Measurements: Goal: Will remain free from infection Outcome: Progressing Goal: Cardiovascular complication will be avoided Outcome: Progressing   Problem: Nutrition: Goal: Adequate nutrition will be maintained Outcome: Progressing   Problem: Safety: Goal: Ability to remain free from injury will improve Outcome: Progressing

## 2017-10-13 NOTE — Plan of Care (Signed)
  Problem: Pain Managment: Goal: General experience of comfort will improve Outcome: Not Progressing Note:  Patient continues to have intense pain. Pain evident whenever you touch patient- no matter how lightly. He is seen mouthing " Stop" or "Help." Increase in fentanyl pushes from 50 to 100 helps, but patient might benefit from a fentanyl gtt to better manage his pain. About an hour and a half into his dose of fentanyl patient begins straining against the ventilator when he tries to voice pain. Continuing same until family makes decision about his GOC. Per day shift RN- family heard to say that patient is "going to come through this"- although he is only declining at this time.

## 2017-10-13 NOTE — Progress Notes (Signed)
Visited with patient as nurse recommended. Asked patient if prayer was ok with him but not able to communicate. His history said [employer] so I did have prayer with him regarding his sickness, his caregivers and for peace of mind and heart. I was not able to know if he was aware of the visit or not. Hopefully he is feeling peace. Phebe CollaDonna S Joliene Salvador, Chaplain   10/13/17 1100  Clinical Encounter Type  Visited With Patient  Visit Type Initial;Spiritual support  Referral From Nurse  Consult/Referral To Chaplain  Spiritual Encounters  Spiritual Needs Prayer;Emotional  Stress Factors  Patient Stress Factors Not reviewed  Family Stress Factors Not reviewed

## 2017-10-13 NOTE — Progress Notes (Signed)
No call from [employer] today.    As stated previously, this is a patient with respiratory failure at this time vent-dependent.  He is able to tolerate several hours of pressure-support only during the day, and appears to be good candidate to wean from vent, having done it in the past.  In the meantime, he has acute on chronic renal failure with rising BUN, tube feeds with hypernatremia, and worsened congestive heart failure, with a new EF of 20%.  It is my opinion and that of our CCM team that he still has hospital-level needs, but appears to be a good candidate to step-down to LTAC level of care.   I spoke tonight with patient's daughter Oran ReinSuprena, about this.  No change to code status.  Patient will need palliative follow up at next level of care.

## 2017-10-13 NOTE — Progress Notes (Signed)
Notified by Elayne SnareEmily Nix that patient was denied for LTAC. Dr Maryfrances Bunnellanford agreeable to do peer to peer. Provided Irving Burtonmily with his contact info to give to [employer], they should be in contact with him today.

## 2017-10-13 NOTE — Progress Notes (Signed)
Nutrition Follow-up  DOCUMENTATION CODES:   Not applicable  INTERVENTION:   Tube Feeding:  Vital AF @ 55 ml/hr Pro-Stat 30 mL BID  Recommend removal of current Cortrak tube with replacement of Cortrak in opposite nare due to device-related pressure injury  Recommend PEG tube placement if continued aggressive care  NUTRITION DIAGNOSIS:   Inadequate oral intake related to inability to eat as evidenced by estimated needs.  Being addressed via TF  GOAL:   Patient will meet greater than or equal to 90% of their needs  Met  MONITOR:   Diet advancement, Vent status, Weight trends, Labs, I & O's, TF tolerance  REASON FOR ASSESSMENT:   Consult Enteral/tube feeding initiation and management  ASSESSMENT:   Patient with PMH significant for ETOH abuse, CAD s/p CABG, CHF, alcoholic hepatitis, PAF s/p ablation, DM, and cardiac arrest in setting of withdrawals. Presents this admission after a several day alcohol binge. Admitted for acute metabolic encephalopathy and respiratory failure requiring intubation.   Pt remains on vent support Noted plan to go to LTAC, poor prognosis, palliative care following  6/26 trach tube change  Pt with multiple device related pressure injuries: stage III in nare from Cortrak tube, stage III on throat due to trach, stage II on forehead d/t forehead O2 probe  Vital AF 1.2 @ 55 ml/hr, Pro-Stat 30 mL BID Free water 200 mL TID  Weight has fluctuated since admission; lowest weight 79 kg on 6/19. Current wt 83.3 kg. Net positive 5.8 L.   Labs: sodium 147, BUN 183, Creatinine 2.28, CBGs 124-270 Meds: thiamine, MVI, lantus, ss novolog, folic acid  Diet Order:   Diet Order           Diet NPO time specified  Diet effective midnight          EDUCATION NEEDS:   Not appropriate for education at this time  Skin:  Skin Assessment: Skin Integrity Issues: Skin Integrity Issues:: Stage II, Stage III Stage II: buttock, forehead (device  related) Stage III: nare (device related), neck (device related) Other: MASD: buttock  Last BM:  6/19  Height:   Ht Readings from Last 1 Encounters:  10/06/2017 5' 10.5" (1.791 m)    Weight:   Wt Readings from Last 1 Encounters:  10/14/17 187 lb 2.7 oz (84.9 kg)    Ideal Body Weight:  75.5 kg  BMI:  Body mass index is 26.48 kg/m.  Estimated Nutritional Needs:   Kcal:  1870 kcals   Protein:  115-130 g  Fluid:  >1.6 L/day   Kerman Passey MS, RD, LDN, CNSC [phone] Pager  [phone] Weekend/On-Call Pager

## 2017-10-14 ENCOUNTER — Other Ambulatory Visit: Payer: Self-pay | Admitting: Internal Medicine

## 2017-10-14 DIAGNOSIS — J9601 Acute respiratory failure with hypoxia: Secondary | ICD-10-CM

## 2017-10-14 DIAGNOSIS — Z4659 Encounter for fitting and adjustment of other gastrointestinal appliance and device: Secondary | ICD-10-CM

## 2017-10-14 DIAGNOSIS — Z430 Encounter for attention to tracheostomy: Secondary | ICD-10-CM

## 2017-10-14 DIAGNOSIS — Z66 Do not resuscitate: Secondary | ICD-10-CM

## 2017-10-14 LAB — BASIC METABOLIC PANEL
Anion gap: 12 (ref 5–15)
BUN: 185 mg/dL — AB (ref 8–23)
CALCIUM: 8.3 mg/dL — AB (ref 8.9–10.3)
CHLORIDE: 115 mmol/L — AB (ref 98–111)
CO2: 20 mmol/L — AB (ref 22–32)
Creatinine, Ser: 2.2 mg/dL — ABNORMAL HIGH (ref 0.61–1.24)
GFR calc Af Amer: 31 mL/min — ABNORMAL LOW (ref >60)
GFR calc non Af Amer: 27 mL/min — ABNORMAL LOW (ref >60)
GLUCOSE: 214 mg/dL — AB (ref 70–99)
Potassium: 4.6 mmol/L (ref 3.5–5.1)
Sodium: 147 mmol/L — ABNORMAL HIGH (ref 135–145)

## 2017-10-14 LAB — GLUCOSE, CAPILLARY
GLUCOSE-CAPILLARY: 196 mg/dL — AB (ref 70–99)
Glucose-Capillary: 159 mg/dL — ABNORMAL HIGH (ref 70–99)
Glucose-Capillary: 171 mg/dL — ABNORMAL HIGH (ref 70–99)
Glucose-Capillary: 193 mg/dL — ABNORMAL HIGH (ref 70–99)
Glucose-Capillary: 200 mg/dL — ABNORMAL HIGH (ref 70–99)
Glucose-Capillary: 202 mg/dL — ABNORMAL HIGH (ref 70–99)

## 2017-10-14 LAB — CBC
HCT: 29.8 % — ABNORMAL LOW (ref 39.0–52.0)
Hemoglobin: 9.1 g/dL — ABNORMAL LOW (ref 13.0–17.0)
MCH: 30.1 pg (ref 26.0–34.0)
MCHC: 30.5 g/dL (ref 30.0–36.0)
MCV: 98.7 fL (ref 78.0–100.0)
PLATELETS: 247 10*3/uL (ref 150–400)
RBC: 3.02 MIL/uL — AB (ref 4.22–5.81)
RDW: 20 % — AB (ref 11.5–15.5)
WBC: 13.5 10*3/uL — ABNORMAL HIGH (ref 4.0–10.5)

## 2017-10-14 NOTE — Progress Notes (Signed)
PROGRESS NOTE    Johnny [name]  OLM:[id number] DOB: 1934-11-04 DOA: 10/12/2017 PCP: Marletta Lor, MD    Brief Narrative:  Johnny [name] is a 83 y.o. M with CHF EF 40%, isch CM s/p CABG in 2003, NIDDM, HTN, alcoholism and parox atrial fibrillation not on warfarin but s/p Ablation in 2009 presented with septic shock presumed respiratory source, aspiration vs CAP.  Per admission H&P, he is now back to being mostly wheelchair bound, had been in the midst of a several day alcohol binge again when he started coughin, then on the day of admission, developed sudden severe respiratory distress.  When he arrived to ED, he was desaturating to 50%, required intubation promptly, then started on pressors and admitted to ICU.    Assessment & Plan:  Acute hypoxic respiratory failure likely secondary to multifocal pneumonia with effusions and congestive heart failure.  Patient has received vancomycin and cefepime.  Received IV diuresis.  Patient had slow weaning from the ventilator and required tracheostomy.  Negative care on board.  Acute on chronic systolic congestive heart failure.  Previous ejection fraction of 40%.  Recent EF is 20 to 25%.  Continue on CHF protocol.  Continue Coreg.  Monitor elect lites closely.  Patient will need to follow-up with cardiology at discharge with [name].  Off Lasix at this time.  Acute kidney injury on chronic kidney disease stage III.  Creatinine of 1.2-1.3.  Diuretics.  Current creatinine levels of 2.28 trending down from peak of 2.9  Hypernatremia.  On Free water.  We will closely monitor.  Atrial fibrillation/multifocal atrial tachycardia.  CHADS2Vasc of 8 but has been considered high risk for anticoagulation due to alcohol abuse and concerns for possible.  Recent ventricular tachycardia.  On Coreg.  Aim for Mag >2 and K>4.  No further episodes  History of coronary artery disease, ischemic cardiomyopathy.  Continue Coreg, aspirin Plavix statin.  Continue supportive  care.  No active chest pain.  Diabetes mellitus type II.  Continue patient on sliding scale insulin, Lantus.  Accu-Cheks diabetic diet.  Feeding via tube  Chronic normocytic anemia Closely monitor CBC.  Thrombocytopenia: Resolved  Alcoholism: Not in withdrawal at this time.  Continue thiamine folic acid and as needed Ativan for anxiety.    Nutrition: continue tube feeds with free water flushes.  Nutrition on board.  Neck wound from tracheostomy.  Wound care on board.  Continue supportive care.   DVT prophylaxis: Heparin  Code Status: DO NOT RESUSCITATE  Family Communication: None present  Disposition plan.  Patient is an appropriate candidate for LTAC placement.  Consultants:   CCM  Cardiology  WOC  Procedures:   Echo 6/6 LV EF: 20% -   25%    Antimicrobials:   Vancomycin 6/5 >> 6/10  Cefepime 6/5 >> 6/12    Cultures:   6/5 blood cultures x2: NGTD    Subjective:  Still on vent through the tracheostomy.  Does not verbally communicate but responds to painful extremities.  Nursing reported ulceration over the neck but no mention of new fever or increasing respiratory distress.  Has loose stools.  Objective: Vitals:   10/14/17 0800 10/14/17 0810 10/14/17 0833 10/14/17 0900  BP: (!) 120/55  (!) 120/55 105/64  Pulse: 78   88  Resp: 20   (!) 35  Temp:  98.6 F (37 C)    TempSrc:      SpO2: 100%  92% 91%  Weight:      Height:  Intake/Output Summary (Last 24 hours) at 10/14/2017 1005 Last data filed at 10/14/2017 0800 Gross per 24 hour  Intake 1330 ml  Output 1205 ml  Net 125 ml   Filed Weights   10/12/17 0312 10/13/17 0234 10/14/17 0159  Weight: 82 kg (180 lb 12.4 oz) 83.3 kg (183 lb 10.3 oz) 84.9 kg (187 lb 2.7 oz)   Physical Exam Examination: General appearance: Trach, opens eyes spontaneously and on pain.  NG tube in place HEENT: Anicteric, conjunctival watery, lips dry, edentulous, oropharynx tacky dry, no oral lesions.  Pressure  ulcer noted underneath trach collar.  There is no redness surrounding this or induration.  Skin: Skin is warm and dry, without suspicious rashes or lesions on the face, neck, chest, arms, legs, abdomen.  Ulcer at the tracheostomy site as described above Cardiac: S2 heard no murmur regular rate and rhythm. Abdomen: Soft, no distention, no tenderness to palpation .  No palpable hepatosplenomegaly.  Rectal tube in place, Foley catheter in place MSK: No deformities or effusions of the large joints of the upper or lower extremities bilaterally.  On mittens bilaterally Neuro: Opens eyes spontaneously and moves extremities.  Does not verbally communicate.    Psych: Unable to assess.   Data Reviewed: I have personally reviewed following labs and imaging studies:  CBC: Recent Labs  Lab 10/08/17 0333  10/10/17 0317 10/11/17 0647 10/12/17 0344 10/13/17 0311 10/14/17 0733  WBC 13.6*   < > 16.4* 17.0* 18.5* 15.3* 13.5*  NEUTROABS 10.1*  --   --   --   --   --   --   HGB 8.7*   < > 8.6* 9.2* 9.0* 9.0* 9.1*  HCT 27.9*   < > 27.8* 29.6* 28.9* 28.2* 29.8*  MCV 97.6   < > 99.6 97.7 97.6 96.6 98.7  PLT 264   < > 243 232 247 242 247   < > = values in this interval not displayed.   Basic Metabolic Panel: Recent Labs  Lab 10/09/17 0313 10/10/17 0317 10/11/17 0647 10/12/17 0344 10/13/17 0311 10/14/17 0733  NA 143 146* 147* 149* 147* 147*  K 4.0 3.9 4.1 4.2 4.0 4.6  CL 112* 114* 118* 117* 119* 115*  CO2 20* 21* 19* 20* 18* 20*  GLUCOSE 242* 247* 124* 186* 175* 214*  BUN 159* 169* 177* 179* 183* 185*  CREATININE 2.95* 2.77* 2.47* 2.40* 2.28* 2.20*  CALCIUM 8.2* 8.3* 8.5* 8.3* 8.3* 8.3*  MG 2.9*  --   --   --   --   --    GFR: Estimated Creatinine Clearance: 28.1 mL/min (A) (by C-G formula based on SCr of 2.2 mg/dL (H)). Liver Function Tests: No results for input(s): AST, ALT, ALKPHOS, BILITOT, PROT, ALBUMIN in the last 168 hours. No results for input(s): LIPASE, AMYLASE in the last 168  hours. No results for input(s): AMMONIA in the last 168 hours. Coagulation Profile: No results for input(s): INR, PROTIME in the last 168 hours. Cardiac Enzymes: No results for input(s): CKTOTAL, CKMB, CKMBINDEX, TROPONINI in the last 168 hours. BNP (last 3 results) No results for input(s): PROBNP in the last 8760 hours. HbA1C: No results for input(s): HGBA1C in the last 72 hours. CBG: Recent Labs  Lab 10/13/17 1554 10/13/17 1920 10/13/17 2320 10/14/17 0310 10/14/17 0719  GLUCAP 202* 234* 213* 159* 193*   Lipid Profile: No results for input(s): CHOL, HDL, LDLCALC, TRIG, CHOLHDL, LDLDIRECT in the last 72 hours. Thyroid Function Tests: No results for input(s): TSH, T4TOTAL, FREET4,  T3FREE, THYROIDAB in the last 72 hours. Anemia Panel: No results for input(s): VITAMINB12, FOLATE, FERRITIN, TIBC, IRON, RETICCTPCT in the last 72 hours. Urine analysis:    Component Value Date/Time   COLORURINE AMBER (A) 09/28/2017 1615   APPEARANCEUR HAZY (A) 09/24/2017 1615   LABSPEC 1.017 09/22/2017 1615   PHURINE 5.0 09/29/2017 1615   GLUCOSEU NEGATIVE 09/28/2017 1615   HGBUR MODERATE (A) 10/03/2017 1615   BILIRUBINUR NEGATIVE 09/25/2017 1615   KETONESUR NEGATIVE 09/29/2017 1615   PROTEINUR 100 (A) 09/30/2017 1615   UROBILINOGEN 1.0 04/02/2012 0204   NITRITE NEGATIVE 10/03/2017 1615   LEUKOCYTESUR NEGATIVE 10/12/2017 1615   Sepsis Labs: '@LABRCNTIP' (procalcitonin:4,lacticacidven:4)  )No results found for this or any previous visit (from the past 240 hour(s)).    Radiology Studies: No results found.   Scheduled Meds: . aspirin  81 mg Per Tube Daily  . atorvastatin  20 mg Per Tube Daily  . carvedilol  6.25 mg Oral BID WC  . chlorhexidine gluconate (MEDLINE KIT)  15 mL Mouth Rinse BID  . clopidogrel  75 mg Per Tube Daily  . famotidine  20 mg Per Tube Daily  . feeding supplement (PRO-STAT SUGAR FREE 64)  30 mL Per Tube BID  . fentaNYL  50 mcg Transdermal Q72H  . folic acid  1 mg  Per Tube Daily  . free water  200 mL Per Tube Q8H  . heparin  5,000 Units Subcutaneous Q8H  . insulin aspart  0-9 Units Subcutaneous Q4H  . insulin glargine  8 Units Subcutaneous QHS  . mouth rinse  15 mL Mouth Rinse 10 times per day  . multivitamin with minerals  1 tablet Per Tube Daily  . thiamine  100 mg Per Tube Daily   Continuous Infusions: . sodium chloride Stopped (10/10/17 1121)  . feeding supplement (VITAL AF 1.2 CAL) 1,000 mL (10/13/17 1927)     LOS: 21 days     Flora Lipps, MD [facility] 10/14/2017, 10:05 AM     Pager [phone] --- please page though AMION:  [email or web address] If 7PM-7AM, please contact night-coverage

## 2017-10-14 NOTE — Progress Notes (Signed)
Reviewed the chart.  Spoke with [facility] MD (Dr. Maryfrances Bunnellanford).  Spoke with CCM RN.   Examined patient and spoke with HCPOA about code status.     Johnny [name] and her family have opted for DNR code status.  Specifically if [name] suffers cardiac or respiratory ARREST, then they want him to be peaceful and not resuscitated.      Otherwise if [name] has a pulse or is breathing we should take measures to support him.  The family anticipates that he will return to LTAC from the hospital.  Physical exam:  Well developed chronically ill appearing gentleman, eyes open, does not attempt to speak, mitts on hands CV rrr no m/r/g resp no distress, trach in place Abdomen slightly distended, active bowel sounds Extremities 1+ edema, LE in prevlon boots Neuro:  Patient attempted to shift his eyes to look at me when I asked him to do so, but otherwise did not move or interact.  Assessment:  83 yo chronically ill male with a history of alcoholism, acute on chronic respiratory failure likely sue to aspiration pneumonia, worsening heart failure with an EF of 20-25%, acute on chronic renal failure, afib, NSTEMI, bed bound, unable to eat (PEG in place).  Unable to interact to a great extent with the world around him.  Recommendations: Change code status to DNR Plan to return to LTAC Please put in DC summary for [facility] for family support.  Johnny RichardsMarianne Kihanna Kamiya, PA-C Palliative Medicine Pager: [phone]  35 min.

## 2017-10-14 NOTE — Plan of Care (Signed)
  Problem: Pain Managment: Goal: General experience of comfort will improve Outcome: Not Progressing Note:  Patient pain seems slightly better controlled by pain patches, however, any intervention or movement of patient still causes him to get extremely rigid and grimace with mouth wide open. Still can see attempts by patient telling us to "stop."    Problem: Skin Integrity: Goal: Risk for impaired skin integrity will decrease Outcome: Not Progressing Note:  While cleaning patient's trach for second time during shift- abscess to stg 3 under trach became evident when purulent discharge was expelled from opening. Site is very malodorous with brownish grey discharge present.

## 2017-10-14 NOTE — Consult Note (Addendum)
WOC Nurse wound consult note I spoke via the phone to Dr. Tyson BabinskiPokhrel regarding a consult [facility] for the tracheal pressure injury. I explained to Dr. Tyson BabinskiPokhrel that I saw the patient 6/24 at 10:30 a.m. and what my findings of the area were at that time.  I reviewed with Dr. Tyson BabinskiPokhrel that Johnny ResidesPeter [name] changed out the patient's tracheostomy tube to one that per his note "that w/ this trach flange being softer and smaller we can facilitate wound healing".    I explained that I believe the wound is fully necrotic in the area of the MDRPI through to the trachea.  I am concerned that if we were to institute an enzymatic debriding agent (Santyl) with saline moistened gauze, that the necrotic exudate would slip down the trachea and the patient would aspirate on it.  In my opinion, it would be preferable to have an ENT or Critical Care specialist that is credentialed to perform a sharp debridement of the wound and remove all the necrotic tissue.    Dr. Tyson BabinskiPokhrel will further consider the best approach to caring for this complex area and order accordingly. Monitor the wound area(s) for worsening of condition such as: Signs/symptoms of infection,  Increase in size,  Development of or worsening of odor, Development of pain, or increased pain at the affected locations.  Notify the medical team if any of these develop.  Thank you for the consult.  Helmut MusterSherry Kasia Trego, RN, MSN, CWOCN, CNS-BC, pager [phone]

## 2017-10-14 NOTE — Progress Notes (Signed)
Inpatient Diabetes Program Recommendations  AACE/ADA: New Consensus Statement on Inpatient Glycemic Control (2015)  Target Ranges:  Prepandial:   less than 140 mg/dL      Peak postprandial:   less than 180 mg/dL (1-2 hours)      Critically ill patients:  140 - 180 mg/dL   Results for Johnny [name], Johnny [name] (MRN [id number]) as of 10/14/2017 11:39  Ref. Range 10/13/2017 07:14 10/13/2017 11:12 10/13/2017 12:31 10/13/2017 15:54 10/13/2017 19:20 10/13/2017 23:20 10/14/2017 03:10 10/14/2017 07:19 10/14/2017 11:23  Glucose-Capillary Latest Ref Range: 70 - 99 mg/dL 829188 (H) 562270 (H) 130229 (H) 202 (H) 234 (H) 213 (H) 159 (H) 193 (H) 200 (H)   Review of Glycemic Control  Current orders for Inpatient glycemic control: Lantus 8 units qhs, Novolog 0-9 units Q4 hours  Inpatient Diabetes Program Recommendations:   Consider Novolog 3 units Q4 hours tube feed coverage in addition to correction scale.  Thanks,  Christena DeemShannon Teagon Kron RN, MSN, BC-ADM, [facility] Inpatient Diabetes Coordinator Team Pager [phone] (8a-5p)

## 2017-10-14 NOTE — Procedures (Signed)
Tracheostomy tube change: Re-rounded to re-assess site.  Unfortunately there was still significant pressure on the wound from the Portex trach. [facility] Not able to assess the extent of the necrosis. The old  # 7 cuffed portex trach was carefully removed. the tracheostomy stoma was unremarkable but the pressure ulcer on the left sternum remains very concerning w/ purulent foul smelling discharge. A new #  Cuffed 6 biovona trach was easily placed in the tracheostomy stoma and secured with velcro trach ties.   The tracheostomy was patent, good color change observed via EZ-CAP, and the patient was easily able to voice with finger occlusion and tolerated the procedure well with no immediate complications.   I called ENT Spoke w/ Dr Annalee GentaShoemaker. He advises that we should try to avoid debridement of that area given risk of further infecting the mediastinum and that the best approach is to keep pressure off the site and keep it clean. If in a couple of weeks things still not improved OR if things are worse then they could consider debridement BUT non-surgical approach would be preferred. I shared this w/ the primary team as well as [facility].   Simonne MartinetPeter E Babcock ACNP-BC [facility]Critical [facility] Pager # [phone] OR # [phone] if no answer

## 2017-10-15 LAB — GLUCOSE, CAPILLARY
GLUCOSE-CAPILLARY: 101 mg/dL — AB (ref 70–99)
GLUCOSE-CAPILLARY: 227 mg/dL — AB (ref 70–99)
GLUCOSE-CAPILLARY: 214 mg/dL — AB (ref 70–99)
Glucose-Capillary: 169 mg/dL — ABNORMAL HIGH (ref 70–99)
Glucose-Capillary: 175 mg/dL — ABNORMAL HIGH (ref 70–99)

## 2017-10-15 LAB — CBC
HCT: 28.8 % — ABNORMAL LOW (ref 39.0–52.0)
HEMOGLOBIN: 8.8 g/dL — AB (ref 13.0–17.0)
MCH: 30.1 pg (ref 26.0–34.0)
MCHC: 30.6 g/dL (ref 30.0–36.0)
MCV: 98.6 fL (ref 78.0–100.0)
Platelets: 245 10*3/uL (ref 150–400)
RBC: 2.92 MIL/uL — AB (ref 4.22–5.81)
RDW: 19.6 % — ABNORMAL HIGH (ref 11.5–15.5)
WBC: 15.3 10*3/uL — AB (ref 4.0–10.5)

## 2017-10-15 LAB — BASIC METABOLIC PANEL
ANION GAP: 10 (ref 5–15)
BUN: 187 mg/dL — ABNORMAL HIGH (ref 8–23)
CALCIUM: 8.7 mg/dL — AB (ref 8.9–10.3)
CO2: 20 mmol/L — ABNORMAL LOW (ref 22–32)
Chloride: 120 mmol/L — ABNORMAL HIGH (ref 98–111)
Creatinine, Ser: 2.22 mg/dL — ABNORMAL HIGH (ref 0.61–1.24)
GFR calc non Af Amer: 26 mL/min — ABNORMAL LOW (ref >60)
GFR, EST AFRICAN AMERICAN: 30 mL/min — AB (ref >60)
GLUCOSE: 166 mg/dL — AB (ref 70–99)
POTASSIUM: 4.8 mmol/L (ref 3.5–5.1)
Sodium: 150 mmol/L — ABNORMAL HIGH (ref 135–145)

## 2017-10-15 MED ORDER — FREE WATER: 200.0000 mL | Freq: Four times a day (QID) | Status: DC

## 2017-10-15 MED ORDER — FREE WATER
200.0000 mL | Freq: Four times a day (QID) | Status: DC
  Administered 2017-10-16: 200 mL

## 2017-10-15 NOTE — Progress Notes (Signed)
Pt vomiting TF, TF held, resumed NPO status. VSS. [facility] aware.

## 2017-10-15 NOTE — Progress Notes (Signed)
eLink Physician-Brief Progress Note Patient Name: Johnny NewcomerDan E Pla DOB: 02-08-1935 MRN: [id number]   Date of Service  10/15/2017  HPI/Events of Note  Possible tracheostomy cuff leak. Patient maintaining tidal volumes   eICU Interventions  Monitor closely and consider switching out tracheostomy tube if problem persists        Cam Harnden U Kahli Mayon 10/15/2017, 10:17 PM

## 2017-10-15 NOTE — Progress Notes (Signed)
PROGRESS NOTE    Johnny [name]  FQM:[id number] DOB: [birth year] DOA: 10/03/2017 PCP: Marletta Lor, MD    Brief Narrative:  Mr. Cowdrey is a 83 y.o. M with CHF EF 40%, isch CM s/p CABG in 2003, NIDDM, HTN, alcoholism and paroxismal atrial fibrillation not on warfarin but s/p Ablation in 2009 presented with septic shock presumed respiratory source, aspiration vs CAP.  Per admission H&P, he is now back to being mostly wheelchair bound, had been in the midst of a several day alcohol binge again when he started coughin, then on the day of admission, developed sudden severe respiratory distress.  When he arrived to ED, he was desaturating to 50%, required intubation promptly, then started on pressors and admitted to ICU.    Assessment & Plan:  Active Problems:   Acute hypoxemic respiratory failure (HCC)   Goals of care, counseling/discussion   Sepsis ([place])   Septic shock ([place])   AKI (acute kidney injury) ([place])   Acute on chronic combined systolic and diastolic CHF (congestive heart failure) (HCC)   Demand ischemia (HCC)   Pressure injury of skin   Palliative care by specialist   Generalized pain   Acute respiratory failure with hypoxemia ([place])   DNR (do not resuscitate)  Acute hypoxic respiratory failure likely secondary to multifocal pneumonia with effusions and congestive heart failure.  Patient has been difficult to wean on trach collar 40%.  Mildly persistent leukocytosis.  Received IV diuresis, currently off diuresis.  Patient had slow weaning from the ventilator and required tracheostomy.  Palliative care and pulmonary on board.  We will check a chest x-ray in a.m.  Acute on chronic systolic congestive heart failure.   Recent EF is 20 to 25%.  Continue on CHF protocol.  Monitor electrolites closely.  Patient will need to follow-up with cardiology at discharge with [name].  Off Lasix at this time.  Continue on aspirin, Lipitor Coreg and Plavix.  Acute kidney injury on chronic kidney  disease stage III.  Slight creatinine of 1.2-1.3  Diuretics.  Current creatinine levels of 2.2 trending down from peak of 2.9 but BUN is significantly elevated at 187.  Hypernatremia.  On Free water.  Off diuretics.  We will closely monitor.  Sodium levels are trending up.  Will increase the frequency and volume of free water.  Atrial fibrillation/multifocal atrial tachycardia.  CHADS2Vasc of 8 but has been considered high risk for anticoagulation.   Recent ventricular tachycardia.  On Coreg.  Aim for Mag >2 and K>4.  No further episodes.  Check magnesium in a.m.  History of coronary artery disease, ischemic cardiomyopathy.  Continue Coreg, aspirin Plavix statin.  Continue supportive care.   Diabetes mellitus type II.  Continue patient on sliding scale insulin, Lantus.  Accu-Cheks diabetic diet.  Feeding via tube  Chronic normocytic anemia. Closely monitor CBC.  Thrombocytopenia: Resolved  Alcoholism: Not in withdrawal at this time.  Continue thiamine,  folic acid and as needed Ativan for anxiety.    Nutrition: continue tube feeds with free water flushes.  Nutrition on board.  Increase free water flushes  Pressure ulceration wound care tracheostomy collar.  I spoke with pulmonary critical care yesterday recommendation is local care and offloading pressure at the site.  Wound care is been consulted.  DVT prophylaxis: Heparin  Code Status: DO NOT RESUSCITATE  Family Communication: None present  Disposition plan.  I had a prolonged discussion with the social worker today.  Patient likely will need long-term placement with vent  facilities.  Consultants:   CCM  Cardiology  WOC  Procedures:   Echo 6/6 LV EF: 20% -   25%  Antimicrobials:   Vancomycin 6/5 >> 6/10  Cefepime 6/5 >> 6/12    Cultures:   6/5 blood cultures x2: NGTD    Subjective:  Patient is little more alert awake today.  Is responding to few commands.  Persistently on vent through trach collar.  Still  has loose stools.  Objective: Vitals:   10/15/17 1138 10/15/17 1200 10/15/17 1300 10/15/17 1400  BP:  (!) 130/48 108/65 121/61  Pulse:   82 90  Resp:  20 (!) 24 18  Temp: 98.3 F (36.8 C)     TempSrc: Oral     SpO2:  100% 100% 100%  Weight:      Height:        Intake/Output Summary (Last 24 hours) at 10/15/2017 1517 Last data filed at 10/15/2017 1400 Gross per 24 hour  Intake 1814.58 ml  Output 1305.2 ml  Net 509.38 ml   Filed Weights   10/13/17 0234 10/14/17 0159 10/15/17 0400  Weight: 83.3 kg (183 lb 10.3 oz) 84.9 kg (187 lb 2.7 oz) 81.6 kg (179 lb 14.3 oz)     Examination: General appearance: Trach, opens eyes spontaneously and on pain.  Follows few verbal commands.  NG tube in place HEENT: Anicteric, conjunctival watery, lips dry, edentulous, oropharynx tacky dry, no oral lesions.  Pressure ulcer noted underneath trach collar.  There is no redness surrounding this or induration.  Skin: Skin is warm and dry, without suspicious rashes or lesions on the face, neck, chest, arms, legs, abdomen.  Ulcer at the tracheostomy site as described above Cardiac: S2 heard no murmur regular rate and rhythm. Abdomen: Soft, no distention, no tenderness to palpation .  No palpable hepatosplenomegaly.  Rectal tube in place, Foley catheter in place MSK: No deformities or effusions of the large joints of the upper or lower extremities bilaterally.  On mittens bilaterally.  Neuro: Opens eyes spontaneously and moves extremities.  Does not verbally communicate.    Psych: Unable to assess.   Data Reviewed: I have personally reviewed following labs and imaging studies:  CBC: Recent Labs  Lab 10/11/17 0647 10/12/17 0344 10/13/17 0311 10/14/17 0733 10/15/17 0648  WBC 17.0* 18.5* 15.3* 13.5* 15.3*  HGB 9.2* 9.0* 9.0* 9.1* 8.8*  HCT 29.6* 28.9* 28.2* 29.8* 28.8*  MCV 97.7 97.6 96.6 98.7 98.6  PLT 232 247 242 247 979   Basic Metabolic Panel: Recent Labs  Lab 10/09/17 0313  10/11/17 0647  10/12/17 0344 10/13/17 0311 10/14/17 0733 10/15/17 0648  NA 143   < > 147* 149* 147* 147* 150*  K 4.0   < > 4.1 4.2 4.0 4.6 4.8  CL 112*   < > 118* 117* 119* 115* 120*  CO2 20*   < > 19* 20* 18* 20* 20*  GLUCOSE 242*   < > 124* 186* 175* 214* 166*  BUN 159*   < > 177* 179* 183* 185* 187*  CREATININE 2.95*   < > 2.47* 2.40* 2.28* 2.20* 2.22*  CALCIUM 8.2*   < > 8.5* 8.3* 8.3* 8.3* 8.7*  MG 2.9*  --   --   --   --   --   --    < > = values in this interval not displayed.   GFR: Estimated Creatinine Clearance: 27.9 mL/min (A) (by C-G formula based on SCr of 2.22 mg/dL (H)). Liver Function Tests: No  results for input(s): AST, ALT, ALKPHOS, BILITOT, PROT, ALBUMIN in the last 168 hours. No results for input(s): LIPASE, AMYLASE in the last 168 hours. No results for input(s): AMMONIA in the last 168 hours. Coagulation Profile: No results for input(s): INR, PROTIME in the last 168 hours. Cardiac Enzymes: No results for input(s): CKTOTAL, CKMB, CKMBINDEX, TROPONINI in the last 168 hours. BNP (last 3 results) No results for input(s): PROBNP in the last 8760 hours. HbA1C: No results for input(s): HGBA1C in the last 72 hours. CBG: Recent Labs  Lab 10/14/17 2001 10/14/17 2334 10/15/17 0406 10/15/17 0744 10/15/17 1145  GLUCAP 202* 171* 101* 169* 227*   Lipid Profile: No results for input(s): CHOL, HDL, LDLCALC, TRIG, CHOLHDL, LDLDIRECT in the last 72 hours. Thyroid Function Tests: No results for input(s): TSH, T4TOTAL, FREET4, T3FREE, THYROIDAB in the last 72 hours. Anemia Panel: No results for input(s): VITAMINB12, FOLATE, FERRITIN, TIBC, IRON, RETICCTPCT in the last 72 hours. Urine analysis:    Component Value Date/Time   COLORURINE AMBER (A) 10/08/2017 1615   APPEARANCEUR HAZY (A) 09/22/2017 1615   LABSPEC 1.017 10/04/2017 1615   PHURINE 5.0 09/27/2017 1615   GLUCOSEU NEGATIVE 09/29/2017 1615   HGBUR MODERATE (A) 09/24/2017 1615   BILIRUBINUR NEGATIVE 10/13/2017 1615    KETONESUR NEGATIVE 09/21/2017 1615   PROTEINUR 100 (A) 10/16/2017 1615   UROBILINOGEN 1.0 04/02/2012 0204   NITRITE NEGATIVE 09/30/2017 1615   LEUKOCYTESUR NEGATIVE 10/05/2017 1615   Sepsis Labs: '@LABRCNTIP' (procalcitonin:4,lacticacidven:4)  )No results found for this or any previous visit (from the past 240 hour(s)).    Radiology Studies: No results found.   Scheduled Meds: . aspirin  81 mg Per Tube Daily  . atorvastatin  20 mg Per Tube Daily  . carvedilol  6.25 mg Oral BID WC  . chlorhexidine gluconate (MEDLINE KIT)  15 mL Mouth Rinse BID  . clopidogrel  75 mg Per Tube Daily  . famotidine  20 mg Per Tube Daily  . feeding supplement (PRO-STAT SUGAR FREE 64)  30 mL Per Tube BID  . fentaNYL  50 mcg Transdermal Q72H  . folic acid  1 mg Per Tube Daily  . free water  200 mL Per Tube Q8H  . heparin  5,000 Units Subcutaneous Q8H  . insulin aspart  0-9 Units Subcutaneous Q4H  . insulin glargine  8 Units Subcutaneous QHS  . mouth rinse  15 mL Mouth Rinse 10 times per day  . multivitamin with minerals  1 tablet Per Tube Daily  . thiamine  100 mg Per Tube Daily   Continuous Infusions: . sodium chloride Stopped (10/10/17 1121)  . feeding supplement (VITAL AF 1.2 CAL) 55 mL/hr at 10/15/17 0500     LOS: 22 days     Flora Lipps, MD [facility] 10/15/2017, 3:17 PM     Pager [phone] --- please page though AMION:  [email or web address] Password [email or web address] If 7PM-7AM, please contact night-coverage

## 2017-10-15 NOTE — Progress Notes (Signed)
Called CCM about constant pulling out of tracheostomy and cortrak. Restraints ordered and palliative re-consult placed. Palliative wanted me to call daughter and update about patient's discomfort and pain about all the lines and tubes. Daughter will speak to rest of family about withdrawing care. Huntley EstelleLewis, Aurea Aronov E, RN 10/15/2017 11:29 AM

## 2017-10-15 NOTE — Progress Notes (Signed)
Septic shock secondary to cap, chf, slow to wean from vent, conts on 40% trach, neuro wise follows with eyes, is not progressing with physical therapy, most likely snf /vent candidate, discussed in los with Wellsite geologistmedical director. CSW advised to  Reach out to vent /snf facility and inform family [place].

## 2017-10-16 ENCOUNTER — Inpatient Hospital Stay (HOSPITAL_COMMUNITY): Payer: Medicare HMO

## 2017-10-16 LAB — CBC
HCT: 31.7 % — ABNORMAL LOW (ref 39.0–52.0)
Hemoglobin: 9.7 g/dL — ABNORMAL LOW (ref 13.0–17.0)
MCH: 29.8 pg (ref 26.0–34.0)
MCHC: 30.6 g/dL (ref 30.0–36.0)
MCV: 97.5 fL (ref 78.0–100.0)
Platelets: 227 10*3/uL (ref 150–400)
RBC: 3.25 MIL/uL — ABNORMAL LOW (ref 4.22–5.81)
RDW: 19.5 % — ABNORMAL HIGH (ref 11.5–15.5)
WBC: 11.2 10*3/uL — ABNORMAL HIGH (ref 4.0–10.5)

## 2017-10-16 LAB — BASIC METABOLIC PANEL
Anion gap: 10 (ref 5–15)
BUN: 184 mg/dL — ABNORMAL HIGH (ref 8–23)
CO2: 21 mmol/L — ABNORMAL LOW (ref 22–32)
Calcium: 9 mg/dL (ref 8.9–10.3)
Chloride: 122 mmol/L — ABNORMAL HIGH (ref 98–111)
Creatinine, Ser: 2.21 mg/dL — ABNORMAL HIGH (ref 0.61–1.24)
GFR calc Af Amer: 31 mL/min — ABNORMAL LOW (ref >60)
GFR calc non Af Amer: 26 mL/min — ABNORMAL LOW (ref >60)
Glucose, Bld: 137 mg/dL — ABNORMAL HIGH (ref 70–99)
Potassium: 4.8 mmol/L (ref 3.5–5.1)
Sodium: 153 mmol/L — ABNORMAL HIGH (ref 135–145)

## 2017-10-16 LAB — GLUCOSE, CAPILLARY
GLUCOSE-CAPILLARY: 85 mg/dL (ref 70–99)
Glucose-Capillary: 108 mg/dL — ABNORMAL HIGH (ref 70–99)
Glucose-Capillary: 129 mg/dL — ABNORMAL HIGH (ref 70–99)
Glucose-Capillary: 130 mg/dL — ABNORMAL HIGH (ref 70–99)
Glucose-Capillary: 144 mg/dL — ABNORMAL HIGH (ref 70–99)
Glucose-Capillary: 86 mg/dL (ref 70–99)

## 2017-10-16 LAB — MAGNESIUM: Magnesium: 2.3 mg/dL (ref 1.7–2.4)

## 2017-10-16 MED ORDER — FREE WATER
200.0000 mL | Status: DC
  Administered 2017-10-16 – 2017-10-19 (×22): 200 mL

## 2017-10-16 NOTE — Progress Notes (Signed)
PROGRESS NOTE    Johnny [name]  IWO:[id number] DOB: Sep 10, 1934 DOA: 10/04/2017 PCP: Marletta Lor, MD    Brief Narrative:  [name] is a 83 y.o. M with CHF EF 40%, isch CM s/p CABG in 2003, NIDDM, HTN, alcoholism and paroxismal atrial fibrillation not on warfarin but s/p Ablation in 2009 presented with septic shock presumed respiratory source, aspiration vs CAP.  Per admission H&P, he is now back to being mostly wheelchair bound, had been in the midst of a several day alcohol binge when he started coughin, then on the day of admission, developed sudden severe respiratory distress.  When he arrived to ED, he was desaturating to 50%, required intubation promptly, then started on pressors and admitted to ICU.    Assessment & Plan:  Active Problems:   Acute hypoxemic respiratory failure (HCC)   Goals of care, counseling/discussion   Sepsis ([place])   Septic shock ([place] [name])   AKI (acute kidney injury) ([place])   Acute on chronic combined systolic and diastolic CHF (congestive heart failure) (HCC)   Demand ischemia (HCC)   Pressure injury of skin   Palliative care by specialist   Generalized pain   Acute respiratory failure with hypoxemia ([place])   DNR (do not resuscitate)  Acute hypoxic respiratory failure likely secondary to multifocal pneumonia with effusions and congestive heart failure.  Patient has been difficult to wean, on trach collar 40%.  Mildly persistent leukocytosis but trending down.  Received IV diuresis initially, currently off diuresis due to renal failure.  Patient had slow weaning from the ventilator and required tracheostomy.  Palliative care and pulmonary on board.  Chest x-ray from today shows stable basilar opacities.  Acute on chronic systolic congestive heart failure.   Recent EF is 20 to 25%.  CHF protocol.  Patient will need to follow-up with cardiology at discharge with [name].  Off Lasix at this time.  Continue on aspirin, Lipitor, Coreg and Plavix.  Acute kidney  injury on chronic kidney disease stage III.  Baseline creatinine of 1.2-1.3. Current creatinine levels of 2.2 trending down from peak of 2.9 but BUN is significantly elevated at 184.  Off diuretics at this time.  Renal function has not significantly improved but has plateaued.  Hypernatremia likely secondary to lack of free water.  Sodium still trending up.  Will increase the amount of free water flushes yesterday.  We will closely monitor for volume overload.   Atrial fibrillation/multifocal atrial tachycardia.  CHADS2Vasc of 8 but has been considered high risk for anticoagulation.   Recent ventricular tachycardia.  On Coreg.  Aim for Mag >2 and K>4.  Currently stable.  History of coronary artery disease, ischemic cardiomyopathy.  Continue Coreg, aspirin Plavix statin.  Continue supportive care.   Diabetes mellitus type II.  Continue patient on sliding scale insulin, Lantus.  Accu-Cheks diabetic diet.  Continue tube feeding  Chronic normocytic anemia. Closely monitor CBC.  No need for blood transfusion  Thrombocytopenia: Resolved  History of alcoholism: Continue thiamine,  folic acid and as needed Ativan for anxiety.    Nutrition: continue tube feeds with free water flushes.  Nutrition on board.  Increase free water flushes  Pressure ulceration wound care tracheostomy collar.  I spoke with pulmonary critical care yesterday recommendation is local care and offloading pressure at the site.  Wound care is been consulted.  DVT prophylaxis: Heparin  Code Status: DO NOT RESUSCITATE  Family Communication: None present  Disposition plan.  Had a discussion with palliative care today.  Palliative care team is having a discussion with the family regarding the goals of care.  Patient is indicating that he does not want to be sustained on tracheostomy.  Consultants:   CCM  Cardiology  WOC  Procedures:   Echo 6/6 LV EF: 20% -   25%  Antimicrobials:   Vancomycin 6/5 >>  6/10  Cefepime 6/5 >> 6/12    Cultures:   6/5 blood cultures x2: NGTD    Subjective:  Patient is still on vent through the trach collar and is not when able.  Is more alert and awake.  He is expressing that he does not wish to be trach.  Palliative care on board.     Objective: Vitals:   10/16/17 1100 10/16/17 1110 10/16/17 1150 10/16/17 1200  BP: (!) 85/52   107/86  Pulse:  77  81  Resp: 20   (!) 26  Temp:   98.1 F (36.7 C)   TempSrc:   Oral   SpO2:  100%  96%  Weight:      Height:        Intake/Output Summary (Last 24 hours) at 10/16/2017 1246 Last data filed at 10/16/2017 1200 Gross per 24 hour  Intake 1014.56 ml  Output 1380 ml  Net -365.44 ml   Filed Weights   10/14/17 0159 10/15/17 0400 10/16/17 0339  Weight: 84.9 kg (187 lb 2.7 oz) 81.6 kg (179 lb 14.3 oz) 81.2 kg (179 lb 0.2 oz)    Examination: General exam: Appears calm, alert awake.  Follows appropriate commands.  NG tube in place.   HEENT:PERRL,Oral mucosa moist, Ear/Nose normal on gross exam.  NG tube in place.  Tracheostomy in place with pressure ulceration underneath the trach collar. Respiratory system: Bilateral equal air entry, normal vesicular breath sounds, no wheezes or crackles  Cardiovascular system: S1 & S2 heard, RRR. No JVD, murmurs, rubs, gallops or clicks. No pedal edema. Gastrointestinal system: Abdomen is nondistended, soft and nontender. No organomegaly or masses felt. Normal bowel sounds heard.  Foley catheter in place.  Rectal tube in place. Central nervous system: Alert and communicative.  On mittens. Extremities: No edema, no clubbing ,no cyanosis, distal peripheral pulses palpable.  On mittens.  Moves extremities Skin: No rashes, lesions or ulcers,no icterus ,no pallor MSK: Moves extremities Psychiatry: Unable to assess  Data Reviewed: I have personally reviewed following labs and imaging studies:  CBC: Recent Labs  Lab 10/12/17 0344 10/13/17 0311 10/14/17 0733  10/15/17 0648 10/16/17 0720  WBC 18.5* 15.3* 13.5* 15.3* 11.2*  HGB 9.0* 9.0* 9.1* 8.8* 9.7*  HCT 28.9* 28.2* 29.8* 28.8* 31.7*  MCV 97.6 96.6 98.7 98.6 97.5  PLT 247 242 247 245 093   Basic Metabolic Panel: Recent Labs  Lab 10/12/17 0344 10/13/17 0311 10/14/17 0733 10/15/17 0648 10/16/17 0720  NA 149* 147* 147* 150* 153*  K 4.2 4.0 4.6 4.8 4.8  CL 117* 119* 115* 120* 122*  CO2 20* 18* 20* 20* 21*  GLUCOSE 186* 175* 214* 166* 137*  BUN 179* 183* 185* 187* 184*  CREATININE 2.40* 2.28* 2.20* 2.22* 2.21*  CALCIUM 8.3* 8.3* 8.3* 8.7* 9.0  MG  --   --   --   --  2.3   GFR: Estimated Creatinine Clearance: 28 mL/min (A) (by C-G formula based on SCr of 2.21 mg/dL (H)). Liver Function Tests: No results for input(s): AST, ALT, ALKPHOS, BILITOT, PROT, ALBUMIN in the last 168 hours. No results for input(s): LIPASE, AMYLASE in the last 168 hours.  No results for input(s): AMMONIA in the last 168 hours. Coagulation Profile: No results for input(s): INR, PROTIME in the last 168 hours. Cardiac Enzymes: No results for input(s): CKTOTAL, CKMB, CKMBINDEX, TROPONINI in the last 168 hours. BNP (last 3 results) No results for input(s): PROBNP in the last 8760 hours. HbA1C: No results for input(s): HGBA1C in the last 72 hours. CBG: Recent Labs  Lab 10/15/17 2031 10/16/17 0023 10/16/17 0447 10/16/17 0750 10/16/17 1200  GLUCAP 175* 144* 130* 129* 108*   Lipid Profile: No results for input(s): CHOL, HDL, LDLCALC, TRIG, CHOLHDL, LDLDIRECT in the last 72 hours. Thyroid Function Tests: No results for input(s): TSH, T4TOTAL, FREET4, T3FREE, THYROIDAB in the last 72 hours. Anemia Panel: No results for input(s): VITAMINB12, FOLATE, FERRITIN, TIBC, IRON, RETICCTPCT in the last 72 hours. Urine analysis:    Component Value Date/Time   COLORURINE AMBER (A) 09/27/2017 1615   APPEARANCEUR HAZY (A) 10/06/2017 1615   LABSPEC 1.017 09/29/2017 1615   PHURINE 5.0 10/04/2017 1615   GLUCOSEU  NEGATIVE 10/06/2017 1615   HGBUR MODERATE (A) 09/21/2017 1615   BILIRUBINUR NEGATIVE 09/22/2017 1615   KETONESUR NEGATIVE 10/05/2017 1615   PROTEINUR 100 (A) 10/03/2017 1615   UROBILINOGEN 1.0 04/02/2012 0204   NITRITE NEGATIVE 10/17/2017 1615   LEUKOCYTESUR NEGATIVE 10/10/2017 1615   Sepsis Labs: _0 (procalcitonin:4,lacticacidven:4)  )No results found for this or any previous visit (from the past 240 hour(s)).    Radiology Studies: Dg Chest Port 1 View  Result Date: 10/16/2017 CLINICAL DATA:  Dyspnea. EXAM: PORTABLE CHEST 1 VIEW COMPARISON:  Radiograph October 11, 2017. FINDINGS: Stable cardiomediastinal silhouette. Status post coronary artery bypass graft. Tracheostomy tube is in good position. Feeding tube is seen entering the stomach. Atherosclerosis of thoracic aorta is noted. No pneumothorax is noted. Stable bibasilar opacities are noted consistent with atelectasis and associated pleural effusions. Bony thorax is unremarkable. IMPRESSION: Stable bibasilar opacities as described above. Electronically Signed   By: Marijo Conception, M.D.   On: 10/16/2017 07:11     Scheduled Meds: . aspirin  81 mg Per Tube Daily  . atorvastatin  20 mg Per Tube Daily  . carvedilol  6.25 mg Oral BID WC  . chlorhexidine gluconate (MEDLINE KIT)  15 mL Mouth Rinse BID  . clopidogrel  75 mg Per Tube Daily  . famotidine  20 mg Per Tube Daily  . feeding supplement (PRO-STAT SUGAR FREE 64)  30 mL Per Tube BID  . fentaNYL  50 mcg Transdermal Q72H  . folic acid  1 mg Per Tube Daily  . free water  200 mL Per Tube Q6H  . heparin  5,000 Units Subcutaneous Q8H  . insulin aspart  0-9 Units Subcutaneous Q4H  . insulin glargine  8 Units Subcutaneous QHS  . mouth rinse  15 mL Mouth Rinse 10 times per day  . multivitamin with minerals  1 tablet Per Tube Daily  . thiamine  100 mg Per Tube Daily   Continuous Infusions: . sodium chloride 10 mL/hr at 10/16/17 0600  . feeding supplement (VITAL AF 1.2 CAL)  Stopped (10/15/17 1832)     LOS: 23 days     Flora Lipps, MD [facility] 10/16/2017, 12:46 PM     Pager [phone] --- please page though AMION:  [email or web address] Password [email or web address] If 7PM-7AM, please contact night-coverage

## 2017-10-16 NOTE — Progress Notes (Signed)
Cortrak Tube Team Note:  Consult received to unclog a clogged Cortrak feeding tube. Discussed with RN whether to removed existing Cortrak tube (in right nare) and reposition in left nare due to device-related pressure injury. Per RN, plan is to await palliative care meeting scheduled for tomorrow where pt's family will discuss goals of care so as not to put pt through unnecessary procedures and possible discomfort.  This RD and a second RD replaced the Lopez valve and flushed Cortrak with saline without meeting any resistance. Alerted RN that existing Cortrak can continue to be used as it appears the clog was within in the [place] valve.  A 10 F Cortrak tube remains in the RIGHT nare and remains secured with a nasal bridle at 98 cm.  No x-ray is required as Cortrak was not repositioned or moved in any way. RN may continue using tube.  If the tube becomes dislodged or clogged please keep the tube and contact the Cortrak team at [email or web address] (password [email or web address]) for replacement.  If after hours and replacement cannot be delayed, place a NG tube and confirm placement with an abdominal x-ray.    Earma ReadingKate Jablonski Chandria Rookstool, MS, RD, LDN Pager: [phone] Weekend/After Hours: [phone]

## 2017-10-16 NOTE — Progress Notes (Signed)
Family meeting scheduled for 10/17/2017 for further goals of care, at 10 AM with patient's 3 daughters. Thank you, Eduard RouxSarah Haisley Arens, NP

## 2017-10-17 LAB — GLUCOSE, CAPILLARY
GLUCOSE-CAPILLARY: 113 mg/dL — AB (ref 70–99)
GLUCOSE-CAPILLARY: 121 mg/dL — AB (ref 70–99)
GLUCOSE-CAPILLARY: 135 mg/dL — AB (ref 70–99)
Glucose-Capillary: 102 mg/dL — ABNORMAL HIGH (ref 70–99)
Glucose-Capillary: 114 mg/dL — ABNORMAL HIGH (ref 70–99)
Glucose-Capillary: 122 mg/dL — ABNORMAL HIGH (ref 70–99)
Glucose-Capillary: 99 mg/dL (ref 70–99)

## 2017-10-17 LAB — CBC
HEMATOCRIT: 32 % — AB (ref 39.0–52.0)
Hemoglobin: 9.8 g/dL — ABNORMAL LOW (ref 13.0–17.0)
MCH: 30.5 pg (ref 26.0–34.0)
MCHC: 30.6 g/dL (ref 30.0–36.0)
MCV: 99.7 fL (ref 78.0–100.0)
Platelets: 244 10*3/uL (ref 150–400)
RBC: 3.21 MIL/uL — AB (ref 4.22–5.81)
RDW: 19.6 % — ABNORMAL HIGH (ref 11.5–15.5)
WBC: 9.6 10*3/uL (ref 4.0–10.5)

## 2017-10-17 LAB — COMPREHENSIVE METABOLIC PANEL
ALBUMIN: 2.2 g/dL — AB (ref 3.5–5.0)
ALK PHOS: 192 U/L — AB (ref 38–126)
ALT: 26 U/L (ref 0–44)
AST: 23 U/L (ref 15–41)
Anion gap: 14 (ref 5–15)
BILIRUBIN TOTAL: 1.1 mg/dL (ref 0.3–1.2)
BUN: 166 mg/dL — AB (ref 8–23)
CO2: 19 mmol/L — ABNORMAL LOW (ref 22–32)
Calcium: 8.9 mg/dL (ref 8.9–10.3)
Chloride: 120 mmol/L — ABNORMAL HIGH (ref 98–111)
Creatinine, Ser: 2.21 mg/dL — ABNORMAL HIGH (ref 0.61–1.24)
GFR calc Af Amer: 31 mL/min — ABNORMAL LOW (ref >60)
GFR, EST NON AFRICAN AMERICAN: 26 mL/min — AB (ref >60)
GLUCOSE: 114 mg/dL — AB (ref 70–99)
Potassium: 4.6 mmol/L (ref 3.5–5.1)
Sodium: 153 mmol/L — ABNORMAL HIGH (ref 135–145)
TOTAL PROTEIN: 6.8 g/dL (ref 6.5–8.1)

## 2017-10-17 NOTE — Progress Notes (Signed)
Daily Progress Note   Patient Name: Johnny [name]       Date: 10/17/2017 DOB: 1934/07/17  Age: 83 y.o. MRN#: [id number] Attending Physician: Jani Gravel, MD Primary Care Physician: Marletta Lor, MD Admit Date: 10/15/2017  Reason for Consultation/Follow-up: Establishing goals of care, Psychosocial/spiritual support and Withdrawal of life-sustaining treatment  Subjective: Patient seen, chart reviewed.  He appears in distress: grimacing, grabbing my hand Per chart review, patient's Cortrak did clog last night but has since been opened.  Patient has remained ventilator dependent and is not making any progress weaning.  He is now a DNR.  He continues with tube feedings but did have an episode of vomiting the other day  Length of Stay: 24  Current Medications: Scheduled Meds:  . aspirin  81 mg Per Tube Daily  . atorvastatin  20 mg Per Tube Daily  . carvedilol  6.25 mg Oral BID WC  . chlorhexidine gluconate (MEDLINE KIT)  15 mL Mouth Rinse BID  . clopidogrel  75 mg Per Tube Daily  . famotidine  20 mg Per Tube Daily  . feeding supplement (PRO-STAT SUGAR FREE 64)  30 mL Per Tube BID  . fentaNYL  50 mcg Transdermal Q72H  . folic acid  1 mg Per Tube Daily  . free water  200 mL Per Tube Q3H  . heparin  5,000 Units Subcutaneous Q8H  . insulin aspart  0-9 Units Subcutaneous Q4H  . insulin glargine  8 Units Subcutaneous QHS  . mouth rinse  15 mL Mouth Rinse 10 times per day  . multivitamin with minerals  1 tablet Per Tube Daily  . thiamine  100 mg Per Tube Daily    Continuous Infusions: . sodium chloride Stopped (10/16/17 2310)  . feeding supplement (VITAL AF 1.2 CAL) Stopped (10/15/17 1832)    PRN Meds: atropine, docusate, fentaNYL (SUBLIMAZE) injection, LORazepam, ondansetron,  promethazine, white petrolatum  Physical Exam  Constitutional:  Acutely ill appearing elderly man; he appears to be in pain, he is grimacing He is in restraints  HENT:  Head: Normocephalic and atraumatic.  Neck:  Tracheostomy  Cardiovascular: Normal rate.  Pulmonary/Chest:  Patient has a tracheostomy Later support Secretions present  Abdominal:  Cortrak  Genitourinary:  Genitourinary Comments: Foley catheter Rectal tube  Neurological:  Patient is alert at times and attempting  to communicate Eyes has been very somnolent but awakens easily to voice and light touch  Skin: Skin is warm and dry.  Nursing note and vitals reviewed.           Vital Signs: BP (!) 90/44   Pulse 95   Temp 98.2 F (36.8 C) (Oral)   Resp (!) 27   Ht 5' 10.5" (1.791 m)   Wt 82.4 kg (181 lb 10.5 oz)   SpO2 100%   BMI 25.70 kg/m  SpO2: SpO2: 100 % O2 Device: O2 Device: Ventilator O2 Flow Rate: O2 Flow Rate (L/min): 10 L/min  Intake/output summary:   Intake/Output Summary (Last 24 hours) at 10/17/2017 1226 Last data filed at 10/17/2017 3536 Gross per 24 hour  Intake 1309.71 ml  Output 1055 ml  Net 254.71 ml   LBM: Last BM Date: 10/16/17(flexiseal) Baseline Weight: Weight: 81.6 kg (180 lb) Most recent weight: Weight: 82.4 kg (181 lb 10.5 oz)       Palliative Assessment/Data:    Flowsheet Rows     Most Recent Value  Intake Tab  Referral Department  Critical care  Unit at Time of Referral  ICU  Palliative Care Primary Diagnosis  Pulmonary  Date Notified  10/08/17  Palliative Care Type  New Palliative care  Reason for referral  Clarify Goals of Care, Psychosocial or Spiritual support  Date of Admission  10/06/2017  Date first seen by Palliative Care  10/09/17  # of days Palliative referral response time  1 Day(s)  # of days IP prior to Palliative referral  15  Clinical Assessment  Palliative Performance Scale Score  20%  Pain Max last 24 hours  Not able to report  Pain Min Last 24  hours  Not able to report  Dyspnea Max Last 24 Hours  Not able to report  Dyspnea Min Last 24 hours  Not able to report  Nausea Max Last 24 Hours  Not able to report  Nausea Min Last 24 Hours  Not able to report  Anxiety Max Last 24 Hours  Not able to report  Anxiety Min Last 24 Hours  Not able to report  Other Max Last 24 Hours  Not able to report  Psychosocial & Spiritual Assessment  Palliative Care Outcomes  Patient/Family meeting held?  Yes  Who was at the meeting?  2 daughters  Palliative Care follow-up planned  Yes, Facility      Patient Active Problem List   Diagnosis Date Noted  . Acute respiratory failure with hypoxemia ([place])   . DNR (do not resuscitate)   . Palliative care by specialist   . Generalized pain   . Pressure injury of skin 10/08/2017  . Acute on chronic combined systolic and diastolic CHF (congestive heart failure) ([place])   . Demand ischemia ([place])   . Sepsis ([place]) 10/15/2017  . Septic shock ([place])   . AKI (acute kidney injury) ([place])   . Elevated troponin   . Chest pain 11/16/2016  . CAP (community acquired pneumonia) 09/22/2015  . Pressure ulcer 09/22/2015  . Goals of care, counseling/discussion   . Alcoholic cardiomyopathy ([place]) 05/02/2015  . Debility   . COPD (chronic obstructive pulmonary disease), presumed 04/26/2012  . Protein calorie malnutrition ([place]) 04/26/2012  . Poor dentition 04/26/2012  . Atrial flutter ([place]) 04/13/2012  . Protein-calorie malnutrition, severe ([place]) 04/11/2012  . Acute hypoxemic respiratory failure ([place]) 04/02/2012  . s/p PEA Cardiac arrest: Likely related to pneumonia, acidosis, hypoxic respiratory failure in setting of ETOH  withdrawal 04/02/2012  . Anoxic Encephalopathy: due to hypoxia after cardiac arrest.  04/02/2012  . HCAP (healthcare-associated pneumonia) -->resolved 04/01/2012  . Alcohol dependence s/p withdrawl  04/01/2012  . acute renal failure (resolved): Baseline creatinine 0.95 from 01/05/12.  04/01/2012  . SIRS  (systemic inflammatory response syndrome) ([place]) 12/30/2011  . Hypomagnesemia 12/30/2011  . Weakness generalized 12/29/2011  . Hypertensive urgency 12/29/2011  . Tachycardia 04/28/2011  . Lower extremity weakness 04/28/2011  . Anemia of chronic disease 08/16/2007  . Paroxysmal atrial fibrillation ([place]) 08/12/2007  . Diabetes mellitus with peripheral vascular disease ([place]) 12/11/2006  . Essential hypertension 12/11/2006  . Coronary atherosclerosis 12/11/2006    Palliative Care Assessment & Plan   Patient Profile: 83 y.o. male  with past medical history of alcoholism, systolic heart failure, atrial fib, history of trach and acute respiratory failure requiring long convalescence (at [facility] in the past for 6 months) admitted on 09/25/2017 with acute respiratory failure..  Patient has now had a tracheostomy performed but is not weaning well from the ventilator support.  Patient also now showing acute kidney injury on top of chronic kidney disease.  Today's creatinine 2.95.  Patient had an ejection fraction of 40% in 2017 but is now at 20 to 25%.  Consult ordered for goals of care  Patient has remained ventilator dependent.  Per staff, patient has indicated at times that he is tired and would like to stop aggressive measures.  He has been attempting to pull out tubes and lines and is now restrained.  Family have set care limits of DNR  Assessment: Met with patient's daughters: Criss Alvine, his son Karie Schwalbe, as well as speaking to his other son Witt via phone today.  We discussed continuing on aggressive pathway  which would include continuing ventilation, placement of permanent feeding tube and transfer to [facility] versus pursuing full comfort care which would include liberating patient from the vent restraints, artificial feeding tubes, not pursuing permanent feeding tube.  The latter would likely involve a prognosis of just hours to days.  Family is aware that there are only 3  hospitals in [place] that can care for a patient with this amount of technology and that there is a possibility  he may not receive a bed offer locally which could further impact his quality of life.  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  Family is hopeful to hear from patient that he is ready to stop as he  indicated to staff before reaching a final decision They themselves articulate that they would not want to live this way but their father in the past,always indicated that he would want to pursue all measures.  Recommendations/Plan:  Palliative medicine to follow-up with healthcare POA, Earnest Conroy, daughter, tomorrow morning.  Family was going to attempt to speak to patient today and elicit what his goals are about continuing on his current path versus comfort care  Goals of Care and Additional Recommendations:  Limitations on Scope of Treatment: Full Scope Treatment except for DNR/DNI  Code Status:    Code Status Orders  (From admission, onward)        Start     Ordered   10/14/17 0947  Do not attempt resuscitation (DNR)  Continuous    Question Answer  Comment  In the event of cardiac or respiratory ARREST Do not call a "code blue"   In the event of cardiac or respiratory ARREST Do not perform Intubation, CPR, defibrillation or ACLS   In the event of cardiac or respiratory ARREST Use medication by any route, position, wound care, and other measures to relive pain and suffering. May use oxygen, suction and manual treatment of airway obstruction as needed for comfort.      10/14/17 0948    Code Status History    Date Active Date Inactive Code Status Order ID Comments User Context   09/19/2017 1637 10/14/2017 0948 Full Code [id number]  Marijean Heath, NP ED    11/16/2016 1240 11/18/2016 1951 Full Code [id number]  Elwin Mocha, MD ED   09/22/2015 0842 09/26/2015 1748 Full Code [id number]  Edwin Dada, MD Inpatient   05/01/2015 0703 05/08/2015 1942 Full Code [id number]  Toy Baker, MD ED   12/29/2011 0249 01/05/2012 2040 Full Code [id number]  Theressa Millard, MD ED   04/29/2011 0023 05/03/2011 2158 Full Code [id number]  [place], Gwenith Daily, RN Inpatient       Prognosis:   < 6 months in the setting of acute respiratory failure, trach vent dependent, heart failure with an EF of 20 to 25%, chronic kidney disease stage IV, protein calorie malnutrition with albumin of 2.2.  If family elects one-way extubation, full comfort care, patient would likely have a prognosis of hours to days; hospital death  Discharge Planning:  To Be Determined   Thank you for allowing the Palliative Medicine Team to assist in the care of this patient.   Time In: 1000 Time Out: 1130 Total Time 90 min Prolonged Time Billed  yes       Greater than 50%  of this time was spent counseling and coordinating care related to the above assessment and plan.  Dory Horn, NP  Please contact Palliative Medicine Team phone at [phone] for questions and concerns.

## 2017-10-17 NOTE — Progress Notes (Signed)
Patient ID: Johnny [name], male   DOB: [birth year], 83 y.o.   MRN: [id number]                                                                PROGRESS NOTE                                                                                                                                                                                                             Patient Demographics:    Johnny [name], is a 83 y.o. male, DOB - 12-24-1934, PPJ:[id number]  Admit date - 10/01/2017   Admitting Physician Aldean Jewett, MD  Outpatient Primary MD for the patient is Marletta Lor, MD  LOS - 24  Outpatient Specialists:     Chief Complaint  Patient presents with  . Shortness of Breath  . Altered Mental Status       Brief Narrative  83 y.o. M with CHF EF 40%, isch CM s/p CABG in 2003, NIDDM, HTN, alcoholism and paroxismal atrial fibrillation not on warfarin but s/p Ablation in 2009 presented with septic shock presumed respiratory source, aspiration vs CAP.  Per admission H&P, he is now back to being mostly wheelchair bound, had been in the midst of a several day alcohol binge when he started coughin, then on the day of admission, developed sudden severe respiratory distress.  When he arrived to ED, he was desaturating to 50%, required intubation promptly, then started on pressors and admitted to ICU.       Subjective:    Johnny [name] today  Is unchanged.  Pt is on vent currently .  Family is going to have meeting with palliative care today to discuss goals of care    Assessment  & Plan :    Active Problems:   Acute hypoxemic respiratory failure (HCC)   Goals of care, counseling/discussion   Sepsis ([place])   Septic shock ([place])   AKI (acute kidney injury) ([place])   Acute on chronic combined systolic and diastolic CHF (congestive heart failure) ([place])   Demand ischemia (HCC)   Pressure injury of skin   Palliative care by specialist   Generalized pain   Acute respiratory failure with hypoxemia ([place])   DNR  (do not resuscitate)   Acute hypoxic respiratory failure likely secondary  to multifocal pneumonia with effusions and congestive heart failure.  Patient has been difficult to wean, on trach collar 40%.  Mildly persistent leukocytosis but trending down.  Received IV diuresis initially, currently off diuresis due to renal failure.  Patient had slow weaning from the ventilator and required tracheostomy.  Palliative care and pulmonary on board.  Awaiting palliative care recommendations, appreciate input  Acute on chronic systolic congestive heart failure. Recent EF is 20 to 25%.  CHF protocol.  Patient will need to follow-up with cardiology at discharge with [name].   Off Lasix at this time.   Continue on aspirin, Lipitor, Coreg and Plavix.  Acute kidney injury on chronic kidney disease stage III.  Baseline creatinine of 1.2-1.3. Current creatinine levels of 2.2 trending down from peak of 2.9 but BUN is significantly elevated at 184.  Off diuretics at this time.  Renal function has not significantly improved but has plateaued.  Hypernatremia likely secondary to lack of free water.  Sodium still trending up.  Will increase the amount of free water flushes 6/27   will closely monitor for volume overload. If hypernatremia not improving will consider increase free water futher tomorrow   Atrial fibrillation/multifocal atrial tachycardia.  CHADS2Vasc of 8 but has been considered high risk for anticoagulation.   Recent ventricular tachycardia.   Cont Coreg.  Aim for Mag >2 and K>4.  Currently stable.  History of coronary artery disease, ischemic cardiomyopathy.  Continue Coreg, aspirin Plavix statin.  Continue supportive care.   Diabetes mellitus type II.   Continue patient on sliding scale insulin, Lantus.  Accu-Cheks diabetic diet.  Continue tube feeding  Chronic normocytic anemia.  Closely monitor CBC.  No need for blood transfusion  Thrombocytopenia: Resolved  History of  alcoholism:  Continue thiamine,  folic acid and as needed Ativan for anxiety.    Nutrition: continue tube feeds with free water flushes.  Nutrition on board.  Increase free water flushes  Pressure ulceration wound care tracheostomy collar. Prior physician spoke with pulmonary critical care 6/27 recommendation is local care and offloading pressure at the site.  Wound care is been consulted.  DVT prophylaxis: Heparin  Code Status: DO NOT RESUSCITATE  Family Communication: None present  Disposition plan.  Had a discussion with palliative care today.  Palliative care team is having a discussion with the family regarding the goals of care.  Patient is indicating that he does not want to be sustained on tracheostomy.  Consultants:   CCM  Cardiology  WOC  Procedures:   Echo 6/6 LV EF: 20% - 25%  Antimicrobials:   Vancomycin 6/5 >> 6/10  Cefepime 6/5 >> 6/12    Cultures:   6/5 blood cultures x2: NGTD       Anti-infectives (From admission, onward)   Start     Dose/Rate Route Frequency Ordered Stop   09/24/17 1600  vancomycin (VANCOCIN) IVPB 1000 mg/200 mL premix  Status:  Discontinued     1,000 mg 200 mL/hr over 60 Minutes Intravenous Every 24 hours 10/01/2017 1611 09/29/17 1058   09/24/17 1530  ceFEPIme (MAXIPIME) 2 g in sodium chloride 0.9 % 100 mL IVPB     2 g 200 mL/hr over 30 Minutes Intravenous Every 24 hours 09/22/2017 1611 09/30/17 1522   09/22/2017 1430  ceFEPIme (MAXIPIME) 2 g in sodium chloride 0.9 % 100 mL IVPB     2 g 200 mL/hr over 30 Minutes Intravenous  Once 09/30/2017 1417 10/09/2017 1622   10/15/2017 1430  vancomycin (VANCOCIN)  IVPB 1000 mg/200 mL premix  Status:  Discontinued     1,000 mg 200 mL/hr over 60 Minutes Intravenous  Once 10/06/2017 1417 09/24/2017 1420   09/26/2017 1430  vancomycin (VANCOCIN) 1,500 mg in sodium chloride 0.9 % 500 mL IVPB     1,500 mg 250 mL/hr over 120 Minutes Intravenous  Once 10/11/2017 1420 09/24/2017 1630         Objective:   Vitals:   10/17/17 0607 10/17/17 0700 10/17/17 0800 10/17/17 0801  BP: (!) 144/60 (!) 80/62 (!) 90/44 (!) 90/44  Pulse: 83 82 85 95  Resp: (!) '21 20 19 ' (!) 27  Temp:   98.2 F (36.8 C)   TempSrc:   Oral   SpO2: 100% 100% 100%   Weight:      Height:        Wt Readings from Last 3 Encounters:  10/17/17 82.4 kg (181 lb 10.5 oz)  05/05/17 81 kg (178 lb 9.6 oz)  01/20/17 76.8 kg (169 lb 3.4 oz)     Intake/Output Summary (Last 24 hours) at 10/17/2017 1039 Last data filed at 10/17/2017 6222 Gross per 24 hour  Intake 1319.71 ml  Output 1205 ml  Net 114.71 ml     Physical Exam  Awake Alert, opens eyes, No new F.N deficits, Normal affect [place].AT,PERRAL Supple Neck,No JVD, No cervical lymphadenopathy appriciated.  Symmetrical Chest wall movement, Good air movement bilaterally, CTAB RRR,No Gallops,Rubs or new Murmurs, No Parasternal Heave +ve B.Sounds, Abd Soft, No tenderness, No organomegaly appriciated, No rebound - guarding or rigidity. No Cyanosis, Clubbing or edema, No new Rash or bruise   + tracheostomy   Data Review:    CBC Recent Labs  Lab 10/13/17 0311 10/14/17 0733 10/15/17 0648 10/16/17 0720 10/17/17 0623  WBC 15.3* 13.5* 15.3* 11.2* 9.6  HGB 9.0* 9.1* 8.8* 9.7* 9.8*  HCT 28.2* 29.8* 28.8* 31.7* 32.0*  PLT 242 247 245 227 244  MCV 96.6 98.7 98.6 97.5 99.7  MCH 30.8 30.1 30.1 29.8 30.5  MCHC 31.9 30.5 30.6 30.6 30.6  RDW 19.9* 20.0* 19.6* 19.5* 19.6*    Chemistries  Recent Labs  Lab 10/13/17 0311 10/14/17 0733 10/15/17 0648 10/16/17 0720 10/17/17 0623  NA 147* 147* 150* 153* 153*  K 4.0 4.6 4.8 4.8 4.6  CL 119* 115* 120* 122* 120*  CO2 18* 20* 20* 21* 19*  GLUCOSE 175* 214* 166* 137* 114*  BUN 183* 185* 187* 184* 166*  CREATININE 2.28* 2.20* 2.22* 2.21* 2.21*  CALCIUM 8.3* 8.3* 8.7* 9.0 8.9  MG  --   --   --  2.3  --   AST  --   --   --   --  23  ALT  --   --   --   --  26  ALKPHOS  --   --   --   --  192*  BILITOT  --   --    --   --  1.1   ------------------------------------------------------------------------------------------------------------------ No results for input(s): CHOL, HDL, LDLCALC, TRIG, CHOLHDL, LDLDIRECT in the last 72 hours.  Lab Results  Component Value Date   HGBA1C 5.7 04/23/2017   ------------------------------------------------------------------------------------------------------------------ No results for input(s): TSH, T4TOTAL, T3FREE, THYROIDAB in the last 72 hours.  Invalid input(s): FREET3 ------------------------------------------------------------------------------------------------------------------ No results for input(s): VITAMINB12, FOLATE, FERRITIN, TIBC, IRON, RETICCTPCT in the last 72 hours.  Coagulation profile No results for input(s): INR, PROTIME in the last 168 hours.  No results for input(s): DDIMER in the last 72 hours.  Cardiac Enzymes No results for input(s): CKMB, TROPONINI, MYOGLOBIN in the last 168 hours.  Invalid input(s): CK ------------------------------------------------------------------------------------------------------------------    Component Value Date/Time   BNP 3,168.7 (H) 10/18/2017 1553    Inpatient Medications  Scheduled Meds: . aspirin  81 mg Per Tube Daily  . atorvastatin  20 mg Per Tube Daily  . carvedilol  6.25 mg Oral BID WC  . chlorhexidine gluconate (MEDLINE KIT)  15 mL Mouth Rinse BID  . clopidogrel  75 mg Per Tube Daily  . famotidine  20 mg Per Tube Daily  . feeding supplement (PRO-STAT SUGAR FREE 64)  30 mL Per Tube BID  . fentaNYL  50 mcg Transdermal Q72H  . folic acid  1 mg Per Tube Daily  . free water  200 mL Per Tube Q3H  . heparin  5,000 Units Subcutaneous Q8H  . insulin aspart  0-9 Units Subcutaneous Q4H  . insulin glargine  8 Units Subcutaneous QHS  . mouth rinse  15 mL Mouth Rinse 10 times per day  . multivitamin with minerals  1 tablet Per Tube Daily  . thiamine  100 mg Per Tube Daily   Continuous  Infusions: . sodium chloride Stopped (10/16/17 2310)  . feeding supplement (VITAL AF 1.2 CAL) Stopped (10/15/17 1832)   PRN Meds:.atropine, docusate, fentaNYL (SUBLIMAZE) injection, LORazepam, ondansetron, promethazine, white petrolatum  Micro Results No results found for this or any previous visit (from the past 240 hour(s)).  Radiology Reports Ct Head Wo Contrast  Result Date: 10/09/2017 CLINICAL DATA:  Altered level of consciousness. Intubated patient, obtunded with no sedation. EXAM: CT HEAD WITHOUT CONTRAST TECHNIQUE: Contiguous axial images were obtained from the base of the skull through the vertex without intravenous contrast. COMPARISON:  09/21/2015 FINDINGS: Brain: Stable atrophy and chronic small vessel ischemia. No intracranial hemorrhage, mass effect, or midline shift. No evidence of cerebral edema. No hydrocephalus. The basilar cisterns are patent. No evidence of territorial infarct or acute ischemia. No extra-axial or intracranial fluid collection. Vascular: Atherosclerosis of skullbase vasculature without hyperdense vessel or abnormal calcification. Skull: No fracture or focal lesion. Sinuses/Orbits: No acute finding.  Prior cataract resection. Other: None. IMPRESSION: 1.  No acute intracranial abnormality. 2. Atrophy and chronic small vessel ischemia. Electronically Signed   By: Jeb Levering M.D.   On: 10/05/2017 20:34   Dg Chest Port 1 View  Result Date: 10/16/2017 CLINICAL DATA:  Dyspnea. EXAM: PORTABLE CHEST 1 VIEW COMPARISON:  Radiograph October 11, 2017. FINDINGS: Stable cardiomediastinal silhouette. Status post coronary artery bypass graft. Tracheostomy tube is in good position. Feeding tube is seen entering the stomach. Atherosclerosis of thoracic aorta is noted. No pneumothorax is noted. Stable bibasilar opacities are noted consistent with atelectasis and associated pleural effusions. Bony thorax is unremarkable. IMPRESSION: Stable bibasilar opacities as described above.  Electronically Signed   By: Marijo Conception, M.D.   On: 10/16/2017 07:11   Dg Chest Port 1 View  Result Date: 10/11/2017 CLINICAL DATA:  Respiratory distress. EXAM: PORTABLE CHEST 1 VIEW COMPARISON:  10/08/2017 and older exams FINDINGS: There is increased lung base opacity when compared to the most recent prior exam, most likely due to an increase in pleural fluid. Remainder of the lungs is clear. No evidence of pulmonary edema. No pneumothorax. Stable changes from prior CABG surgery. Tracheostomy tube and enteric feeding tube are stable and well positioned. IMPRESSION: 1. Interval increase in lung base opacity consistent with increased pleural effusions. No evidence of pulmonary edema. No other change. Electronically Signed  By: Lajean Manes M.D.   On: 10/11/2017 15:58   Dg Chest Port 1 View  Result Date: 10/08/2017 CLINICAL DATA:  Acute respiratory failure EXAM: PORTABLE CHEST 1 VIEW COMPARISON:  10/07/2017 FINDINGS: Tracheostomy tube, feeding catheter and right jugular central line are again seen and stable. Cardiac shadow is stable. Postsurgical changes are again seen. Small bilateral pleural effusions are noted left greater than right. Mild left basilar atelectasis is noted as well. IMPRESSION: No significant interval change from the prior exam. Small bilateral pleural effusions are again seen. Electronically Signed   By: Inez Catalina M.D.   On: 10/08/2017 08:29   Dg Chest Port 1 View  Result Date: 10/07/2017 CLINICAL DATA:  Hypoxia EXAM: PORTABLE CHEST 1 VIEW COMPARISON:  October 05, 2017 FINDINGS: Tracheostomy catheter tip is 8.0 cm above the carina. Central catheter tip is in the superior vena cava near the cavoatrial junction. Feeding tube tip is below the diaphragm. No pneumothorax. There are small pleural effusions bilaterally with bibasilar atelectasis. There is cardiomegaly with pulmonary vascularity within normal limits. Patient is status post coronary artery bypass grafting. There is aortic  atherosclerosis. No adenopathy. No bone lesions. IMPRESSION: Tube and catheter positions as described without pneumothorax. Cardiomegaly with small pleural effusions bilaterally. There is bibasilar atelectasis. No frank edema or consolidation evident. There is aortic atherosclerosis. Aortic Atherosclerosis (ICD10-I70.0). Electronically Signed   By: Lowella Grip III M.D.   On: 10/07/2017 07:32   Dg Chest Port 1 View  Result Date: 10/05/2017 CLINICAL DATA:  Respiratory failure, shortness of breath. EXAM: PORTABLE CHEST 1 VIEW COMPARISON:  Portable chest x-ray of October 04, 2017 FINDINGS: The lungs are adequately inflated. The tracheostomy tube tip projects between the clavicular heads. Bibasilar atelectasis or pneumonia is present greatest on the left. There small bilateral pleural effusions which appears stable. The heart is top-normal in size. The pulmonary vascularity is mildly engorged. The patient has undergone previous CABG. A feeding tube is present whose tip projects below the inferior margin of the image. The right internal jugular venous catheter tip projects over the midportion of the SVC. IMPRESSION: Fairly stable changes of CHF with mild interstitial edema and small bilateral pleural effusions. There may be bibasilar atelectasis greatest on the left. Electronically Signed   By: David  Martinique M.D.   On: 10/05/2017 07:11   Dg Chest Port 1 View  Result Date: 10/04/2017 CLINICAL DATA:  Respiratory failure EXAM: PORTABLE CHEST 1 VIEW COMPARISON:  1 day prior FINDINGS: Right internal jugular line tip unchanged. Feeding tube extends beyond the inferior aspect of the film. Tracheostomy appropriately positioned. Midline trachea. Mild cardiomegaly. Atherosclerosis in the transverse aorta. Small layering bilateral pleural effusions. Shrapnel about the lower chest. No pneumothorax. Mild interstitial edema is similar. Bibasilar airspace disease is not significantly changed. IMPRESSION: No significant change  since one day prior. Mild congestive heart failure with small bilateral pleural effusions and bibasilar airspace disease-likely atelectasis. Electronically Signed   By: Abigail Miyamoto M.D.   On: 10/04/2017 07:21   Dg Chest Port 1 View  Result Date: 10/03/2017 CLINICAL DATA:  Respiratory failure EXAM: PORTABLE CHEST 1 VIEW COMPARISON:  10/02/2017 FINDINGS: Support devices are stable. Layering bilateral effusions with bilateral perihilar and lower lobe airspace opacities, similar prior study. Mild cardiomegaly. Prior CABG. IMPRESSION: Layering bilateral effusions with bilateral lower lobe atelectasis or infiltrates. Cardiomegaly. No significant change. Electronically Signed   By: Rolm Baptise M.D.   On: 10/03/2017 07:17   Dg Chest Port 1 View  Result Date:  10/02/2017 CLINICAL DATA:  Respiratory failure. EXAM: PORTABLE CHEST 1 VIEW COMPARISON:  10/01/2017.  09/30/2017. FINDINGS: Tracheostomy tube, feeding tube, right IJ line in stable position. Prior CABG. Cardiomegaly with diffuse bilateral pulmonary infiltrates/edema and bilateral pleural effusions. Findings most consistent with CHF. Vascular calcification noted. IMPRESSION: 1.  Lines and tubes in stable position. 2. Prior CABG. Cardiomegaly with diffuse bilateral pulmonary infiltrates/edema bilateral pleural effusions. Findings most consistent with CHF. Electronically Signed   By: Marcello Moores  Register   On: 10/02/2017 06:22   Dg Chest Port 1 View  Result Date: 10/01/2017 CLINICAL DATA:  Endotracheal tube placement EXAM: PORTABLE CHEST 1 VIEW COMPARISON:  09/30/2017 FINDINGS: Tracheostomy in good position. Central venous catheter tip in the SVC unchanged. Feeding tube enters the stomach with the tip not visualized. Bibasilar airspace disease left greater than right is unchanged. Small bilateral effusions also unchanged. Negative for edema. IMPRESSION: Bibasilar airspace disease and bilateral pleural effusions left greater than right unchanged from the prior  study. Electronically Signed   By: Franchot Gallo M.D.   On: 10/01/2017 07:21   Dg Chest Port 1 View  Result Date: 09/30/2017 CLINICAL DATA:  Tracheostomy status.  Pleural effusions. EXAM: PORTABLE CHEST 1 VIEW 3:15 p.m. COMPARISON:  Chest x-ray dated 09/30/2017 at 4:58 a.m. and chest x-ray dated 09/29/2017 FINDINGS: Since the prior study the endotracheal tube has been removed and a tracheostomy tube has been inserted and appears in good position in the AP projection. Central catheter tip is at the cavoatrial junction. Feeding tube tip is below the diaphragm. Bilateral pleural effusions are less prominent than on the prior study of this same date. Chronic cardiomegaly. Aortic atherosclerosis. Pulmonary vascularity appears normal. CABG. IMPRESSION: 1. Tracheostomy tube appears in good position. 2. Decreased bilateral pleural effusions. Haziness in the lung bases has resolved and may have represented mild pulmonary edema. 3.  Aortic Atherosclerosis (ICD10-I70.0). Electronically Signed   By: Lorriane Shire M.D.   On: 09/30/2017 15:41   Dg Chest Port 1 View  Result Date: 09/30/2017 CLINICAL DATA:  Hypoxia EXAM: PORTABLE CHEST 1 VIEW COMPARISON:  September 29, 2017 FINDINGS: Endotracheal tube tip is 3.5 cm above the carina. Nasogastric tube and feeding tube tips are below the diaphragm. Central catheter tip is in the superior vena cava near the cavoatrial junction. No pneumothorax. There are bilateral pleural effusions with patchy airspace consolidation in both lower lobes. Heart is mildly enlarged with pulmonary vascularity normal. Patient is status post coronary artery bypass grafting. There is aortic atherosclerosis. No evident bone lesions. IMPRESSION: Tube and catheter positions as described. Small pneumothorax seen previously on the right is no longer evident. There are bilateral pleural effusions with airspace consolidation in both lower lobes. Suspect a degree of pneumonia in the lung bases, although alveolar  edema could present in this manner. Both entities may be present concurrently. Stable cardiac prominence. There is aortic atherosclerosis. Aortic Atherosclerosis (ICD10-I70.0). Electronically Signed   By: Lowella Grip III M.D.   On: 09/30/2017 07:06   Dg Chest Port 1 View  Result Date: 09/29/2017 CLINICAL DATA:  Endotracheal tube EXAM: PORTABLE CHEST 1 VIEW COMPARISON:  09/28/2017 FINDINGS: Endotracheal tube in good position. Feeding tube in place with the tip not visualized but entering the stomach. NG tube also enters the stomach. Right jugular central venous catheter tip at the cavoatrial junction unchanged. Probable small right apical pneumothorax. Recommend follow-up for confirmation. Bibasilar atelectasis and effusion unchanged.  Negative for edema. IMPRESSION: Endotracheal tube remains in good position. Bibasilar atelectasis and effusion  unchanged Probable small right apical pneumothorax. Recommend follow-up chest x-ray to confirm. Electronically Signed   By: Franchot Gallo M.D.   On: 09/29/2017 08:47   Dg Chest Port 1 View  Result Date: 09/28/2017 CLINICAL DATA:  Respiratory failure EXAM: PORTABLE CHEST 1 VIEW COMPARISON:  Chest x-rays dated 09/27/2017, 09/26/2017 and 11/16/2016. FINDINGS: Endotracheal tube appears well positioned with tip approximately 3 cm above the carina. Enteric tube passes below the diaphragm. RIGHT IJ central line appears adequately positioned with tip at the level of the mid/lower SVC. Stable cardiomegaly. Stable bibasilar pleural effusions with probable associated atelectasis. No new lung findings. No pneumothorax seen. IMPRESSION: 1. No change compared to yesterday's chest x-ray. Persistent bibasilar pleural effusions, small to moderate in size, with probable associated atelectasis. 2. Support apparatus appears appropriately positioned. Electronically Signed   By: Franki Cabot M.D.   On: 09/28/2017 08:40   Dg Chest Port 1 View  Result Date: 09/27/2017 CLINICAL  DATA:  Respiratory failure EXAM: PORTABLE CHEST 1 VIEW COMPARISON:  Chest radiograph from one day prior. FINDINGS: Endotracheal tube tip is 3.4 cm above the carina. Enteric tubes enter stomach with the tips not seen on this image. Right internal jugular central venous catheter terminates at the cavoatrial junction. Intact sternotomy wires. CABG clips overlie the mediastinum. Stable cardiomediastinal silhouette with mild cardiomegaly. No pneumothorax. Stable small bilateral pleural effusions with bibasilar atelectasis. No pulmonary edema. IMPRESSION: 1. Support structures as detailed.  No pneumothorax. 2. Stable small bilateral pleural effusions with bibasilar atelectasis. No pulmonary edema. Electronically Signed   By: Ilona Sorrel M.D.   On: 09/27/2017 09:33   Dg Chest Port 1 View  Result Date: 09/26/2017 CLINICAL DATA:  Pneumonia, ventilatory support EXAM: PORTABLE CHEST 1 VIEW COMPARISON:  09/25/2017 FINDINGS: Endotracheal tube 5.7 cm above the carina. Right IJ central line tip lower SVC level. NG tube within the stomach, tip not visualized. Previous coronary bypass changes noted. Mild cardiomegaly with similar bibasilar consolidation and pleural effusions. Basilar pneumonia not excluded. No pneumothorax. Aorta atherosclerotic. IMPRESSION: Similar bibasilar consolidation and pleural effusions concerning for pneumonia. Stable support apparatus No pneumothorax Electronically Signed   By: Jerilynn Mages.  Shick M.D.   On: 09/26/2017 08:48   Dg Chest Port 1 View  Result Date: 09/25/2017 CLINICAL DATA:  Follow-up pneumonia. EXAM: PORTABLE CHEST 1 VIEW COMPARISON:  Chest radiograph performed 09/24/2017 FINDINGS: The patient's endotracheal tube is seen ending 2 cm above the carina. An enteric tube is noted extending below the diaphragm. The right IJ line is noted ending about the distal SVC. Worsening bibasilar and left perihilar airspace opacification raises concern for worsening pneumonia. Underlying small bilateral pleural  effusions are noted. No pneumothorax is seen. The cardiomediastinal silhouette is normal in size. The patient is status post median sternotomy, with evidence of prior CABG. No acute osseous abnormalities are identified. IMPRESSION: 1. Endotracheal tube seen ending 2 cm above the carina. 2. Worsening bibasilar and left perihilar airspace opacification raises concern for worsening pneumonia. Underlying small bilateral pleural effusions noted. Electronically Signed   By: Garald Balding M.D.   On: 09/25/2017 03:33   Dg Chest Port 1 View  Result Date: 09/24/2017 CLINICAL DATA:  PNA EXAM: PORTABLE CHEST 1 VIEW COMPARISON:  09/20/2017 FINDINGS: The patient has a RIGHT-sided IJ central line, tip overlying the level of superior vena cava. Endotracheal tube is in place with tip approximately 3.5 centimeters above the carina. Nasogastric tube is in place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

## 2017-10-18 LAB — CBC
HEMATOCRIT: 31.5 % — AB (ref 39.0–52.0)
Hemoglobin: 9.4 g/dL — ABNORMAL LOW (ref 13.0–17.0)
MCH: 30.1 pg (ref 26.0–34.0)
MCHC: 29.8 g/dL — AB (ref 30.0–36.0)
MCV: 101 fL — ABNORMAL HIGH (ref 78.0–100.0)
PLATELETS: 239 10*3/uL (ref 150–400)
RBC: 3.12 MIL/uL — ABNORMAL LOW (ref 4.22–5.81)
RDW: 19.1 % — AB (ref 11.5–15.5)
WBC: 9.8 10*3/uL (ref 4.0–10.5)

## 2017-10-18 LAB — BASIC METABOLIC PANEL
Anion gap: 9 (ref 5–15)
BUN: 158 mg/dL — AB (ref 8–23)
CALCIUM: 8.8 mg/dL — AB (ref 8.9–10.3)
CO2: 19 mmol/L — ABNORMAL LOW (ref 22–32)
CREATININE: 2.23 mg/dL — AB (ref 0.61–1.24)
Chloride: 124 mmol/L — ABNORMAL HIGH (ref 98–111)
GFR calc Af Amer: 30 mL/min — ABNORMAL LOW (ref >60)
GFR, EST NON AFRICAN AMERICAN: 26 mL/min — AB (ref >60)
Glucose, Bld: 73 mg/dL (ref 70–99)
Potassium: 4.4 mmol/L (ref 3.5–5.1)
SODIUM: 152 mmol/L — AB (ref 135–145)

## 2017-10-18 LAB — GLUCOSE, CAPILLARY
GLUCOSE-CAPILLARY: 140 mg/dL — AB (ref 70–99)
GLUCOSE-CAPILLARY: 112 mg/dL — AB (ref 70–99)
GLUCOSE-CAPILLARY: 109 mg/dL — AB (ref 70–99)
GLUCOSE-CAPILLARY: 143 mg/dL — AB (ref 70–99)
Glucose-Capillary: 66 mg/dL — ABNORMAL LOW (ref 70–99)
Glucose-Capillary: 59 mg/dL — ABNORMAL LOW (ref 70–99)
Glucose-Capillary: 71 mg/dL (ref 70–99)
Glucose-Capillary: 109 mg/dL — ABNORMAL HIGH (ref 70–99)
Glucose-Capillary: 101 mg/dL — ABNORMAL HIGH (ref 70–99)

## 2017-10-18 MED ORDER — VITAL AF 1.2 CAL PO LIQD
1000.0000 mL | ORAL | Status: DC
  Administered 2017-10-18: 1000 mL

## 2017-10-18 MED ORDER — FAMOTIDINE 20 MG PO TABS
20.0000 mg | ORAL_TABLET | Freq: Every day | ORAL | Status: DC
  Administered 2017-10-19: 20 mg via ORAL
  Filled 2017-10-18: qty 1

## 2017-10-18 MED ORDER — DEXTROSE 50 % IV SOLN
INTRAVENOUS | Status: AC
  Administered 2017-10-18: 50 mL
  Filled 2017-10-18: qty 50

## 2017-10-18 NOTE — Progress Notes (Signed)
Hypoglycemic Event  CBG: 59  Treatment: 1 amp D50  Symptoms: None  Follow-up CBG: Time: 0445 CBG Result: 140  Possible Reasons for Event: NPO   Comments/MD notified: Bodenheimer, NP    Marelin Tat E Eliyana Pagliaro

## 2017-10-18 NOTE — Progress Notes (Signed)
Daily Progress Note   Patient Name: Johnny [name]       Date: 10/18/2017 DOB: Dec 13, 1934  Age: 83 y.o. MRN#: [id number] Attending Physician: Jani Gravel, MD Primary Care Physician: Marletta Lor, MD Admit Date: 09/22/2017  Reason for Consultation/Follow-up: Establishing goals of care and Psychosocial/spiritual support  Subjective: Patient seen, chart reviewed.  Met with patient's daughterSuprena and Barnett Applebaum.  Both shared that they are still undecided about how to proceed forward.  They were hopeful that patient could share with them  his wishes at end-of-life but he has not been able to indicate to them what he did to staff, that he is ready to stop.  Satira Sark does share with me that if he is unable to get a bed locally, that that would factor heavily into their decision to move towards comfort care.  I get the sense that the family is leaning towards comfort care but were hopeful to get more definitive signs that it is time to stop such as patient verbalizing to them what he did to staff or an acute clinical change such as decreased level of consciousness or inability to tolerate TF  Length of Stay: 25  Current Medications: Scheduled Meds:  . aspirin  81 mg Per Tube Daily  . atorvastatin  20 mg Per Tube Daily  . carvedilol  6.25 mg Oral BID WC  . chlorhexidine gluconate (MEDLINE KIT)  15 mL Mouth Rinse BID  . clopidogrel  75 mg Per Tube Daily  . famotidine  20 mg Oral Daily  . feeding supplement (PRO-STAT SUGAR FREE 64)  30 mL Per Tube BID  . fentaNYL  50 mcg Transdermal Q72H  . folic acid  1 mg Per Tube Daily  . free water  200 mL Per Tube Q3H  . heparin  5,000 Units Subcutaneous Q8H  . insulin aspart  0-9 Units Subcutaneous Q4H  . insulin glargine  8 Units Subcutaneous QHS  . mouth  rinse  15 mL Mouth Rinse 10 times per day  . multivitamin with minerals  1 tablet Per Tube Daily  . thiamine  100 mg Per Tube Daily    Continuous Infusions: . sodium chloride Stopped (10/16/17 2310)  . feeding supplement (VITAL AF 1.2 CAL) 1,000 mL (10/18/17 0855)    PRN Meds: atropine, docusate, fentaNYL (SUBLIMAZE) injection, LORazepam,  ondansetron, promethazine  Physical Exam  Constitutional:  Acutely ill-appearing elderly man; responds to voice and light touch  HENT:  Head: Normocephalic and atraumatic.  Cardiovascular: Normal rate.  Pulmonary/Chest:  Trach full vent support  Abdominal:  Cotrak  Genitourinary:  Genitourinary Comments: Rectal tube and Foley  Neurological:  Lethargic  Skin: Skin is warm and dry.  Psychiatric:  Unable to test  Nursing note and vitals reviewed.           Vital Signs: BP 135/70   Pulse 78   Temp 97.9 F (36.6 C) (Axillary)   Resp (!) 21   Ht 5' 10.5" (1.791 m)   Wt 82.2 kg (181 lb 3.5 oz)   SpO2 100%   BMI 25.63 kg/m  SpO2: SpO2: 100 % O2 Device: O2 Device: Ventilator O2 Flow Rate: O2 Flow Rate (L/min): 10 L/min  Intake/output summary:   Intake/Output Summary (Last 24 hours) at 10/18/2017 1446 Last data filed at 10/18/2017 1400 Gross per 24 hour  Intake 1950 ml  Output 850 ml  Net 1100 ml   LBM: Last BM Date: (Flexiseal.) Baseline Weight: Weight: 81.6 kg (180 lb) Most recent weight: Weight: 82.2 kg (181 lb 3.5 oz)       Palliative Assessment/Data:    Flowsheet Rows     Most Recent Value  Intake Tab  Referral Department  Critical care  Unit at Time of Referral  ICU  Palliative Care Primary Diagnosis  Pulmonary  Date Notified  10/08/17  Palliative Care Type  New Palliative care  Reason for referral  Clarify Goals of Care, Psychosocial or Spiritual support  Date of Admission  10/16/2017  Date first seen by Palliative Care  10/09/17  # of days Palliative referral response time  1 Day(s)  # of days IP prior to  Palliative referral  15  Clinical Assessment  Palliative Performance Scale Score  20%  Pain Max last 24 hours  Not able to report  Pain Min Last 24 hours  Not able to report  Dyspnea Max Last 24 Hours  Not able to report  Dyspnea Min Last 24 hours  Not able to report  Nausea Max Last 24 Hours  Not able to report  Nausea Min Last 24 Hours  Not able to report  Anxiety Max Last 24 Hours  Not able to report  Anxiety Min Last 24 Hours  Not able to report  Other Max Last 24 Hours  Not able to report  Psychosocial & Spiritual Assessment  Palliative Care Outcomes  Patient/Family meeting held?  Yes  Who was at the meeting?  2 daughters  Palliative Care follow-up planned  Yes, Facility      Patient Active Problem List   Diagnosis Date Noted  . Acute respiratory failure with hypoxemia ([place])   . DNR (do not resuscitate)   . Palliative care by specialist   . Generalized pain   . Pressure injury of skin 10/08/2017  . Acute on chronic combined systolic and diastolic CHF (congestive heart failure) ([place])   . Demand ischemia ([place])   . Sepsis ([place]) 10/02/2017  . Septic shock ([place])   . AKI (acute kidney injury) ([place])   . Elevated troponin   . Chest pain 11/16/2016  . CAP (community acquired pneumonia) 09/22/2015  . Pressure ulcer 09/22/2015  . Goals of care, counseling/discussion   . Alcoholic cardiomyopathy ([place]) 05/02/2015  . Debility   . COPD (chronic obstructive pulmonary disease), presumed 04/26/2012  . Protein calorie malnutrition ([place]) 04/26/2012  .  Poor dentition 04/26/2012  . Atrial flutter ([place]) 04/13/2012  . Protein-calorie malnutrition, severe ([place]) 04/11/2012  . Acute hypoxemic respiratory failure ([place]) 04/02/2012  . s/p PEA Cardiac arrest: Likely related to pneumonia, acidosis, hypoxic respiratory failure in setting of ETOH withdrawal 04/02/2012  . Anoxic Encephalopathy: due to hypoxia after cardiac arrest.  04/02/2012  . HCAP (healthcare-associated pneumonia) -->resolved  04/01/2012  . Alcohol dependence s/p withdrawl  04/01/2012  . acute renal failure (resolved): Baseline creatinine 0.95 from 01/05/12.  04/01/2012  . SIRS (systemic inflammatory response syndrome) ([place]) 12/30/2011  . Hypomagnesemia 12/30/2011  . Weakness generalized 12/29/2011  . Hypertensive urgency 12/29/2011  . Tachycardia 04/28/2011  . Lower extremity weakness 04/28/2011  . Anemia of chronic disease 08/16/2007  . Paroxysmal atrial fibrillation ([place]) 08/12/2007  . Diabetes mellitus with peripheral vascular disease ([place]) 12/11/2006  . Essential hypertension 12/11/2006  . Coronary atherosclerosis 12/11/2006    Palliative Care Assessment & Plan   Patient Profile: 83 y.o.malewith past medical history of alcoholism, systolic heart failure, atrial fib, history of trach and acute respiratory failure requiring long convalescence (at [facility] in the past for 6 months)admitted on06/04/2019with acute respiratory failure..Patient has now had a tracheostomy performed but is not weaning well from the ventilator support. Patient also now showing acute kidney injury on top of chronic kidney disease. Today's creatinine 2.95. Patient had an ejection fraction of 40% in 2017 but is now at 20 to 25%.  Consult ordered for goals of care  Patient has remained ventilator dependent.  Per staff, patient has indicated at times that he is tired and would like to stop aggressive measures.  He has been attempting to pull out tubes and lines and is now restrained.  Family have set care limits of DNR   Recommendations/Plan:  Palliative medicine to stay involved and assist family with further goals of care.  As noted, family is aware that they are at this fork in the road of moving towards comfort care or having a PEG placed in pursuing LTAC.  As noted above, if patient is unable to get a bed locally at [facility] this would likely be a factor in moving towards comfort care  Continue with fentanyl  transdermal as well as IV for breakthrough pain  If patient is unable to tolerate tube feedings, this may also be a place where we can reengage and assist family as this could indicate a further clinical change  Palliative medicine to stay involved and continue to assist and support family through this difficult time  Goals of Care and Additional Recommendations:  Limitations on Scope of Treatment: Full Scope Treatment except for DNR  Code Status:    Code Status Orders  (From admission, onward)        Start     Ordered   10/14/17 0947  Do not attempt resuscitation (DNR)  Continuous    Question Answer Comment  In the event of cardiac or respiratory ARREST Do not call a "code blue"   In the event of cardiac or respiratory ARREST Do not perform Intubation, CPR, defibrillation or ACLS   In the event of cardiac or respiratory ARREST Use medication by any route, position, wound care, and other measures to relive pain and suffering. May use oxygen, suction and manual treatment of airway obstruction as needed for comfort.      10/14/17 0948    Code Status History    Date Active Date Inactive Code Status Order ID Comments User Context   10/01/2017 1637 10/14/2017  5027 Full Code [id number]  Marijean Heath, NP ED   11/16/2016 1240 11/18/2016 1951 Full Code [id number]  Elwin Mocha, MD ED   09/22/2015 0842 09/26/2015 1748 Full Code [id number]  Edwin Dada, MD Inpatient   05/01/2015 0703 05/08/2015 1942 Full Code [id number]  Toy Baker, MD ED   12/29/2011 0249 01/05/2012 2040 Full Code [id number]  Theressa Millard, MD ED   04/29/2011 0023 05/03/2011 2158 Full Code [id number]  [place], Gwenith Daily, RN Inpatient       Prognosis:   < 6 months in the setting of acute respiratory failure, trach vent dependent, heart failure with an EF of 20 to 25%, chronic kidney disease stage IV, protein calorie malnutrition with albumin of 2.2.  If family elects one-way extubation, full comfort  care, patient would likely have a prognosis of hours to days; hospital death      Discharge Planning:  To Be Determined  Care plan was discussed with Dr. Maudie Mercury  Thank you for allowing the Palliative Medicine Team to assist in the care of this patient.   Time In: 1030 Time Out: 1100 Total Time 30 min Prolonged Time Billed  no       Greater than 50%  of this time was spent counseling and coordinating care related to the above assessment and plan.  Dory Horn, NP  Please contact Palliative Medicine Team phone at [phone] for questions and concerns.

## 2017-10-18 NOTE — Progress Notes (Addendum)
Patient ID: Johnny [name], male   DOB: 1934/08/02, 83 y.o.   MRN: [id number]                                                                PROGRESS NOTE                                                                                                                                                                                                             Patient Demographics:    Johnny [name], is a 83 y.o. male, DOB - 13-Feb-1935, MIW:[id number]  Admit date - 10/08/2017   Admitting Physician Aldean Jewett, MD  Outpatient Primary MD for the patient is Marletta Lor, MD  LOS - 25  Outpatient Specialists:     Chief Complaint  Patient presents with  . Shortness of Breath  . Altered Mental Status       Brief Narrative   83 y.o.M with CHF EF 40%, isch CM s/p CABG in 2003, NIDDM, HTN, alcoholism and paroxismal atrial fibrillation not on warfarin but s/p Ablation in 2009 presented with septic shock presumed respiratory source, aspiration vs CAP. Per admission H&P, he is now back to being mostly wheelchair bound, had been in the midst of a several day alcohol binge when he started coughin, then on the day of admission, developed sudden severe respiratory distress. When he arrived to ED, he was desaturating to 50%, required intubation promptly, then started on pressors and admitted to ICU.        Subjective:    Johnny [name] today has been afebrile overnite.  He had episodes of n/v the other day so tube feeds have been held. Will resume today.    No headache, No chest pain, No abdominal pain - No Nausea, No new weakness tingling or numbness, No Cough - SOB.   Assessment  & Plan :    Active Problems:   Acute hypoxemic respiratory failure (HCC)   Goals of care, counseling/discussion   Sepsis ([place])   Septic shock ([place])   AKI (acute kidney injury) ([place])   Acute on chronic combined systolic and diastolic CHF (congestive heart failure) ([place])   Demand ischemia (HCC)   Pressure injury of  skin   Palliative care by specialist   Generalized pain   Acute respiratory  failure with hypoxemia (HCC)   DNR (do not resuscitate)      Acute hypoxic respiratory failure likely secondary to multifocal pneumonia with effusions and congestive heart failure. Patient has been difficult to wean,on trach collar 40%. Mildly persistent leukocytosisbut trending down. Received IV diuresisinitially, currently off diuresisdue to renal failure. Patient had slow weaning from the ventilator and required tracheostomy. Palliative care and pulmonary on board.  I spoke with his daughter listed in demographics Earnest Conroy,  Family is still up in the air as to what they want to pursue, they will think about it more today.   Acute on chronic systolic congestive heart failure. Recent EF is 20 to 25%. CHF protocol. Patient will need to follow-up with cardiology at discharge with [name].  Off Lasix at this time.  Continue on aspirin, Lipitor,Coreg and Plavix.  Acute kidney injury on chronic kidney disease stage III.Baselinecreatinine of 1.2-1.3.Current creatinine levels of 2.2 trending down from peak of 2.9 but BUN is significantly elevated at 184.Off diuretics at this time. Renal function has not significantly improved but has plateaued.  Hypernatremialikely secondary to lack of free water. Sodium still trending up. Will increase the amount of free water flushes 6/27  will closely monitor for volume overload. If hypernatremia not improving will consider increase free water futher tomorrow  Atrial fibrillation/multifocal atrial tachycardia. CHADS2Vasc of 8 but has been considered high risk for anticoagulation.   Recent ventricular tachycardia.  Cont Coreg. Aim for Mag >2 and K>4. Currently stable.  History of coronary artery disease, ischemic cardiomyopathy. Continue Coreg, aspirin Plavix statin. Continue supportive care.   Diabetes mellitus type II.  Continue  patient on sliding scale insulin, Lantus. Accu-Cheks diabetic diet. Continue tube feeding  Chronic normocytic anemia.  Closely monitor CBC.No need for blood transfusion  Thrombocytopenia: Resolved  History of alcoholism:  Continue thiamine, folic acid and as needed Ativan for anxiety.   Nutrition: continue tube feeds with free water flushes. Nutrition on board.  Restart tube feeding today at 48m per hour  Pressure ulceration wound care tracheostomy collar. Prior physician spoke with pulmonary critical care 6/27 recommendation is local care and offloading pressure at the site. Wound care is been consulted.  DVT prophylaxis:Heparin  Code Status:DO NOT RESUSCITATE  Family Communication:None present  Disposition plan.Had a discussion with palliative care today. Palliative care team is having a discussion with the family regarding the goals of care. Patient is indicating that he does not want to be sustained on tracheostomy.  Consultants:  CCM  Cardiology  WOC  Procedures:  Echo 6/6 LV EF: 20% - 25%  Antimicrobials:   Vancomycin 6/5 >> 6/10  Cefepime 6/5 >>6/12    Cultures:   6/5 blood cultures x2: NGTD          Lab Results  Component Value Date   PLT 239 10/18/2017      Anti-infectives (From admission, onward)   Start     Dose/Rate Route Frequency Ordered Stop   09/24/17 1600  vancomycin (VANCOCIN) IVPB 1000 mg/200 mL premix  Status:  Discontinued     1,000 mg 200 mL/hr over 60 Minutes Intravenous Every 24 hours 10/08/2017 1611 09/29/17 1058   09/24/17 1530  ceFEPIme (MAXIPIME) 2 g in sodium chloride 0.9 % 100 mL IVPB     2 g 200 mL/hr over 30 Minutes Intravenous Every 24 hours 10/01/2017 1611 09/30/17 1522   09/29/2017 1430  ceFEPIme (MAXIPIME) 2 g in sodium chloride 0.9 % 100 mL IVPB     2  g 200 mL/hr over 30 Minutes Intravenous  Once 09/20/2017 1417 10/14/2017 1622   10/01/2017 1430  vancomycin (VANCOCIN) IVPB 1000  mg/200 mL premix  Status:  Discontinued     1,000 mg 200 mL/hr over 60 Minutes Intravenous  Once 10/17/2017 1417 10/06/2017 1420   10/06/2017 1430  vancomycin (VANCOCIN) 1,500 mg in sodium chloride 0.9 % 500 mL IVPB     1,500 mg 250 mL/hr over 120 Minutes Intravenous  Once 10/02/2017 1420 10/16/2017 1630        Objective:   Vitals:   10/18/17 0507 10/18/17 0600 10/18/17 0800 10/18/17 0830  BP: 95/73 (!) 116/47  (!) 111/94  Pulse: 80 71  85  Resp: (!) 27 20  (!) 27  Temp:   97.9 F (36.6 C)   TempSrc:   Axillary   SpO2: 100% 100%    Weight:      Height:        Wt Readings from Last 3 Encounters:  10/18/17 82.2 kg (181 lb 3.5 oz)  05/05/17 81 kg (178 lb 9.6 oz)  01/20/17 76.8 kg (169 lb 3.4 oz)     Intake/Output Summary (Last 24 hours) at 10/18/2017 0846 Last data filed at 10/18/2017 0834 Gross per 24 hour  Intake 1600 ml  Output 900 ml  Net 700 ml     Physical Exam  Awake Alert, Oriented X 1, No new F.N deficits, Normal affect [place].AT,PERRAL Supple Neck,No JVD, No cervical lymphadenopathy appriciated.  tracheostomy Symmetrical Chest wall movement, Good air movement bilaterally, CTAB RRR,No Gallops,Rubs or new Murmurs, No Parasternal Heave +ve B.Sounds, Abd Soft, No tenderness, No organomegaly appriciated, No rebound - guarding or rigidity. No Cyanosis, Clubbing or edema, No new Rash or bruise  cortrack in place Foley in place Free water running    Data Review:    CBC Recent Labs  Lab 10/14/17 0733 10/15/17 0648 10/16/17 0720 10/17/17 0623 10/18/17 0345  WBC 13.5* 15.3* 11.2* 9.6 9.8  HGB 9.1* 8.8* 9.7* 9.8* 9.4*  HCT 29.8* 28.8* 31.7* 32.0* 31.5*  PLT 247 245 227 244 239  MCV 98.7 98.6 97.5 99.7 101.0*  MCH 30.1 30.1 29.8 30.5 30.1  MCHC 30.5 30.6 30.6 30.6 29.8*  RDW 20.0* 19.6* 19.5* 19.6* 19.1*    Chemistries  Recent Labs  Lab 10/14/17 0733 10/15/17 0648 10/16/17 0720 10/17/17 0623 10/18/17 0345  NA 147* 150* 153* 153* 152*  K 4.6 4.8 4.8 4.6  4.4  CL 115* 120* 122* 120* 124*  CO2 20* 20* 21* 19* 19*  GLUCOSE 214* 166* 137* 114* 73  BUN 185* 187* 184* 166* 158*  CREATININE 2.20* 2.22* 2.21* 2.21* 2.23*  CALCIUM 8.3* 8.7* 9.0 8.9 8.8*  MG  --   --  2.3  --   --   AST  --   --   --  23  --   ALT  --   --   --  26  --   ALKPHOS  --   --   --  192*  --   BILITOT  --   --   --  1.1  --    ------------------------------------------------------------------------------------------------------------------ No results for input(s): CHOL, HDL, LDLCALC, TRIG, CHOLHDL, LDLDIRECT in the last 72 hours.  Lab Results  Component Value Date   HGBA1C 5.7 04/23/2017   ------------------------------------------------------------------------------------------------------------------ No results for input(s): TSH, T4TOTAL, T3FREE, THYROIDAB in the last 72 hours.  Invalid input(s): FREET3 ------------------------------------------------------------------------------------------------------------------ No results for input(s): VITAMINB12, FOLATE, FERRITIN, TIBC, IRON, RETICCTPCT in the last  72 hours.  Coagulation profile No results for input(s): INR, PROTIME in the last 168 hours.  No results for input(s): DDIMER in the last 72 hours.  Cardiac Enzymes No results for input(s): CKMB, TROPONINI, MYOGLOBIN in the last 168 hours.  Invalid input(s): CK ------------------------------------------------------------------------------------------------------------------    Component Value Date/Time   BNP 3,168.7 (H) 09/29/2017 1553    Inpatient Medications  Scheduled Meds: . aspirin  81 mg Per Tube Daily  . atorvastatin  20 mg Per Tube Daily  . carvedilol  6.25 mg Oral BID WC  . chlorhexidine gluconate (MEDLINE KIT)  15 mL Mouth Rinse BID  . clopidogrel  75 mg Per Tube Daily  . famotidine  20 mg Oral Daily  . feeding supplement (PRO-STAT SUGAR FREE 64)  30 mL Per Tube BID  . fentaNYL  50 mcg Transdermal Q72H  . folic acid  1 mg Per Tube  Daily  . free water  200 mL Per Tube Q3H  . heparin  5,000 Units Subcutaneous Q8H  . insulin aspart  0-9 Units Subcutaneous Q4H  . insulin glargine  8 Units Subcutaneous QHS  . mouth rinse  15 mL Mouth Rinse 10 times per day  . multivitamin with minerals  1 tablet Per Tube Daily  . thiamine  100 mg Per Tube Daily   Continuous Infusions: . sodium chloride Stopped (10/16/17 2310)  . feeding supplement (VITAL AF 1.2 CAL)     PRN Meds:.atropine, docusate, fentaNYL (SUBLIMAZE) injection, LORazepam, ondansetron, promethazine, white petrolatum  Micro Results No results found for this or any previous visit (from the past 240 hour(s)).  Radiology Reports Ct Head Wo Contrast  Result Date: 09/27/2017 CLINICAL DATA:  Altered level of consciousness. Intubated patient, obtunded with no sedation. EXAM: CT HEAD WITHOUT CONTRAST TECHNIQUE: Contiguous axial images were obtained from the base of the skull through the vertex without intravenous contrast. COMPARISON:  09/21/2015 FINDINGS: Brain: Stable atrophy and chronic small vessel ischemia. No intracranial hemorrhage, mass effect, or midline shift. No evidence of cerebral edema. No hydrocephalus. The basilar cisterns are patent. No evidence of territorial infarct or acute ischemia. No extra-axial or intracranial fluid collection. Vascular: Atherosclerosis of skullbase vasculature without hyperdense vessel or abnormal calcification. Skull: No fracture or focal lesion. Sinuses/Orbits: No acute finding.  Prior cataract resection. Other: None. IMPRESSION: 1.  No acute intracranial abnormality. 2. Atrophy and chronic small vessel ischemia. Electronically Signed   By: Jeb Levering M.D.   On: 10/14/2017 20:34   Dg Chest Port 1 View  Result Date: 10/16/2017 CLINICAL DATA:  Dyspnea. EXAM: PORTABLE CHEST 1 VIEW COMPARISON:  Radiograph October 11, 2017. FINDINGS: Stable cardiomediastinal silhouette. Status post coronary artery bypass graft. Tracheostomy tube is in good  position. Feeding tube is seen entering the stomach. Atherosclerosis of thoracic aorta is noted. No pneumothorax is noted. Stable bibasilar opacities are noted consistent with atelectasis and associated pleural effusions. Bony thorax is unremarkable. IMPRESSION: Stable bibasilar opacities as described above. Electronically Signed   By: Marijo Conception, M.D.   On: 10/16/2017 07:11   Dg Chest Port 1 View  Result Date: 10/11/2017 CLINICAL DATA:  Respiratory distress. EXAM: PORTABLE CHEST 1 VIEW COMPARISON:  10/08/2017 and older exams FINDINGS: There is increased lung base opacity when compared to the most recent prior exam, most likely due to an increase in pleural fluid. Remainder of the lungs is clear. No evidence of pulmonary edema. No pneumothorax. Stable changes from prior CABG surgery. Tracheostomy tube and enteric feeding tube are stable and  well positioned. IMPRESSION: 1. Interval increase in lung base opacity consistent with increased pleural effusions. No evidence of pulmonary edema. No other change. Electronically Signed   By: Lajean Manes M.D.   On: 10/11/2017 15:58   Dg Chest Port 1 View  Result Date: 10/08/2017 CLINICAL DATA:  Acute respiratory failure EXAM: PORTABLE CHEST 1 VIEW COMPARISON:  10/07/2017 FINDINGS: Tracheostomy tube, feeding catheter and right jugular central line are again seen and stable. Cardiac shadow is stable. Postsurgical changes are again seen. Small bilateral pleural effusions are noted left greater than right. Mild left basilar atelectasis is noted as well. IMPRESSION: No significant interval change from the prior exam. Small bilateral pleural effusions are again seen. Electronically Signed   By: Inez Catalina M.D.   On: 10/08/2017 08:29   Dg Chest Port 1 View  Result Date: 10/07/2017 CLINICAL DATA:  Hypoxia EXAM: PORTABLE CHEST 1 VIEW COMPARISON:  October 05, 2017 FINDINGS: Tracheostomy catheter tip is 8.0 cm above the carina. Central catheter tip is in the superior vena  cava near the cavoatrial junction. Feeding tube tip is below the diaphragm. No pneumothorax. There are small pleural effusions bilaterally with bibasilar atelectasis. There is cardiomegaly with pulmonary vascularity within normal limits. Patient is status post coronary artery bypass grafting. There is aortic atherosclerosis. No adenopathy. No bone lesions. IMPRESSION: Tube and catheter positions as described without pneumothorax. Cardiomegaly with small pleural effusions bilaterally. There is bibasilar atelectasis. No frank edema or consolidation evident. There is aortic atherosclerosis. Aortic Atherosclerosis (ICD10-I70.0). Electronically Signed   By: Lowella Grip III M.D.   On: 10/07/2017 07:32   Dg Chest Port 1 View  Result Date: 10/05/2017 CLINICAL DATA:  Respiratory failure, shortness of breath. EXAM: PORTABLE CHEST 1 VIEW COMPARISON:  Portable chest x-ray of October 04, 2017 FINDINGS: The lungs are adequately inflated. The tracheostomy tube tip projects between the clavicular heads. Bibasilar atelectasis or pneumonia is present greatest on the left. There small bilateral pleural effusions which appears stable. The heart is top-normal in size. The pulmonary vascularity is mildly engorged. The patient has undergone previous CABG. A feeding tube is present whose tip projects below the inferior margin of the image. The right internal jugular venous catheter tip projects over the midportion of the SVC. IMPRESSION: Fairly stable changes of CHF with mild interstitial edema and small bilateral pleural effusions. There may be bibasilar atelectasis greatest on the left. Electronically Signed   By: David  Martinique M.D.   On: 10/05/2017 07:11   Dg Chest Port 1 View  Result Date: 10/04/2017 CLINICAL DATA:  Respiratory failure EXAM: PORTABLE CHEST 1 VIEW COMPARISON:  1 day prior FINDINGS: Right internal jugular line tip unchanged. Feeding tube extends beyond the inferior aspect of the film. Tracheostomy  appropriately positioned. Midline trachea. Mild cardiomegaly. Atherosclerosis in the transverse aorta. Small layering bilateral pleural effusions. Shrapnel about the lower chest. No pneumothorax. Mild interstitial edema is similar. Bibasilar airspace disease is not significantly changed. IMPRESSION: No significant change since one day prior. Mild congestive heart failure with small bilateral pleural effusions and bibasilar airspace disease-likely atelectasis. Electronically Signed   By: Abigail Miyamoto M.D.   On: 10/04/2017 07:21   Dg Chest Port 1 View  Result Date: 10/03/2017 CLINICAL DATA:  Respiratory failure EXAM: PORTABLE CHEST 1 VIEW COMPARISON:  10/02/2017 FINDINGS: Support devices are stable. Layering bilateral effusions with bilateral perihilar and lower lobe airspace opacities, similar prior study. Mild cardiomegaly. Prior CABG. IMPRESSION: Layering bilateral effusions with bilateral lower lobe atelectasis or infiltrates. Cardiomegaly.  No significant change. Electronically Signed   By: Rolm Baptise M.D.   On: 10/03/2017 07:17   Dg Chest Port 1 View  Result Date: 10/02/2017 CLINICAL DATA:  Respiratory failure. EXAM: PORTABLE CHEST 1 VIEW COMPARISON:  10/01/2017.  09/30/2017. FINDINGS: Tracheostomy tube, feeding tube, right IJ line in stable position. Prior CABG. Cardiomegaly with diffuse bilateral pulmonary infiltrates/edema and bilateral pleural effusions. Findings most consistent with CHF. Vascular calcification noted. IMPRESSION: 1.  Lines and tubes in stable position. 2. Prior CABG. Cardiomegaly with diffuse bilateral pulmonary infiltrates/edema bilateral pleural effusions. Findings most consistent with CHF. Electronically Signed   By: Marcello Moores  Register   On: 10/02/2017 06:22   Dg Chest Port 1 View  Result Date: 10/01/2017 CLINICAL DATA:  Endotracheal tube placement EXAM: PORTABLE CHEST 1 VIEW COMPARISON:  09/30/2017 FINDINGS: Tracheostomy in good position. Central venous catheter tip in the SVC  unchanged. Feeding tube enters the stomach with the tip not visualized. Bibasilar airspace disease left greater than right is unchanged. Small bilateral effusions also unchanged. Negative for edema. IMPRESSION: Bibasilar airspace disease and bilateral pleural effusions left greater than right unchanged from the prior study. Electronically Signed   By: Franchot Gallo M.D.   On: 10/01/2017 07:21   Dg Chest Port 1 View  Result Date: 09/30/2017 CLINICAL DATA:  Tracheostomy status.  Pleural effusions. EXAM: PORTABLE CHEST 1 VIEW 3:15 p.m. COMPARISON:  Chest x-ray dated 09/30/2017 at 4:58 a.m. and chest x-ray dated 09/29/2017 FINDINGS: Since the prior study the endotracheal tube has been removed and a tracheostomy tube has been inserted and appears in good position in the AP projection. Central catheter tip is at the cavoatrial junction. Feeding tube tip is below the diaphragm. Bilateral pleural effusions are less prominent than on the prior study of this same date. Chronic cardiomegaly. Aortic atherosclerosis. Pulmonary vascularity appears normal. CABG. IMPRESSION: 1. Tracheostomy tube appears in good position. 2. Decreased bilateral pleural effusions. Haziness in the lung bases has resolved and may have represented mild pulmonary edema. 3.  Aortic Atherosclerosis (ICD10-I70.0). Electronically Signed   By: Lorriane Shire M.D.   On: 09/30/2017 15:41   Dg Chest Port 1 View  Result Date: 09/30/2017 CLINICAL DATA:  Hypoxia EXAM: PORTABLE CHEST 1 VIEW COMPARISON:  September 29, 2017 FINDINGS: Endotracheal tube tip is 3.5 cm above the carina. Nasogastric tube and feeding tube tips are below the diaphragm. Central catheter tip is in the superior vena cava near the cavoatrial junction. No pneumothorax. There are bilateral pleural effusions with patchy airspace consolidation in both lower lobes. Heart is mildly enlarged with pulmonary vascularity normal. Patient is status post coronary artery bypass grafting. There is aortic  atherosclerosis. No evident bone lesions. IMPRESSION: Tube and catheter positions as described. Small pneumothorax seen previously on the right is no longer evident. There are bilateral pleural effusions with airspace consolidation in both lower lobes. Suspect a degree of pneumonia in the lung bases, although alveolar edema could present in this manner. Both entities may be present concurrently. Stable cardiac prominence. There is aortic atherosclerosis. Aortic Atherosclerosis (ICD10-I70.0). Electronically Signed   By: Lowella Grip III M.D.   On: 09/30/2017 07:06   Dg Chest Port 1 View  Result Date: 09/29/2017 CLINICAL DATA:  Endotracheal tube EXAM: PORTABLE CHEST 1 VIEW COMPARISON:  09/28/2017 FINDINGS: Endotracheal tube in good position. Feeding tube in place with the tip not visualized but entering the stomach. NG tube also enters the stomach. Right jugular central venous catheter tip at the cavoatrial junction unchanged. Probable small  right apical pneumothorax. Recommend follow-up for confirmation. Bibasilar atelectasis and effusion unchanged.  Negative for edema. IMPRESSION: Endotracheal tube remains in good position. Bibasilar atelectasis and effusion unchanged Probable small right apical pneumothorax. Recommend follow-up chest x-ray to confirm. Electronically Signed   By: Franchot Gallo M.D.   On: 09/29/2017 08:47   Dg Chest Port 1 View  Result Date: 09/28/2017 CLINICAL DATA:  Respiratory failure EXAM: PORTABLE CHEST 1 VIEW COMPARISON:  Chest x-rays dated 09/27/2017, 09/26/2017 and 11/16/2016. FINDINGS: Endotracheal tube appears well positioned with tip approximately 3 cm above the carina. Enteric tube passes below the diaphragm. RIGHT IJ central line appears adequately positioned with tip at the level of the mid/lower SVC. Stable cardiomegaly. Stable bibasilar pleural effusions with probable associated atelectasis. No new lung findings. No pneumothorax seen. IMPRESSION: 1. No change compared to  yesterday's chest x-ray. Persistent bibasilar pleural effusions, small to moderate in size, with probable associated atelectasis. 2. Support apparatus appears appropriately positioned. Electronically Signed   By: Franki Cabot M.D.   On: 09/28/2017 08:40   Dg Chest Port 1 View  Result Date: 09/27/2017 CLINICAL DATA:  Respiratory failure EXAM: PORTABLE CHEST 1 VIEW COMPARISON:  Chest radiograph from one day prior. FINDINGS: Endotracheal tube tip is 3.4 cm above the carina. Enteric tubes enter stomach with the tips not seen on this image. Right internal jugular central venous catheter terminates at the cavoatrial junction. Intact sternotomy wires. CABG clips overlie the mediastinum. Stable cardiomediastinal silhouette with mild cardiomegaly. No pneumothorax. Stable small bilateral pleural effusions with bibasilar atelectasis. No pulmonary edema. IMPRESSION: 1. Support structures as detailed.  No pneumothorax. 2. Stable small bilateral pleural effusions with bibasilar atelectasis. No pulmonary edema. Electronically Signed   By: Ilona Sorrel M.D.   On: 09/27/2017 09:33   Dg Chest Port 1 View  Result Date: 09/26/2017 CLINICAL DATA:  Pneumonia, ventilatory support EXAM: PORTABLE CHEST 1 VIEW COMPARISON:  09/25/2017 FINDINGS: Endotracheal tube 5.7 cm above the carina. Right IJ central line tip lower SVC level. NG tube within the stomach, tip not visualized. Previous coronary bypass changes noted. Mild cardiomegaly with similar bibasilar consolidation and pleural effusions. Basilar pneumonia not excluded. No pneumothorax. Aorta atherosclerotic. IMPRESSION: Similar bibasilar consolidation and pleural effusions concerning for pneumonia. Stable support apparatus No pneumothorax Electronically Signed   By: Jerilynn Mages.  Shick M.D.   On: 09/26/2017 08:48   Dg Chest Port 1 View  Result Date: 09/25/2017 CLINICAL DATA:  Follow-up pneumonia. EXAM: PORTABLE CHEST 1 VIEW COMPARISON:  Chest radiograph performed 09/24/2017 FINDINGS: The  patient's endotracheal tube is seen ending 2 cm above the carina. An enteric tube is noted extending below the diaphragm. The right IJ line is noted ending about the distal SVC. Worsening bibasilar and left perihilar airspace opacification raises concern for worsening pneumonia. Underlying small bilateral pleural effusions are noted. No pneumothorax is seen. The cardiomediastinal silhouette is normal in size. The patient is status post median sternotomy, with evidence of prior CABG. No acute osseous abnormalities are identified. IMPRESSION: 1. Endotracheal tube seen ending 2 cm above the carina. 2. Worsening bibasilar and left perihilar airspace opacification raises concern for worsening pneumonia. Underlying small bilateral pleural effusions noted. Electronically Signed   By: Garald Balding M.D.   On: 09/25/2017 03:33   Dg Chest Port 1 View  Result Date: 09/24/2017 CLINICAL DATA:  PNA EXAM: PORTABLE CHEST 1 VIEW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

## 2017-10-19 DIAGNOSIS — Z9911 Dependence on respirator [ventilator] status: Secondary | ICD-10-CM

## 2017-10-19 LAB — COMPREHENSIVE METABOLIC PANEL
ALT: 27 U/L (ref 0–44)
AST: 30 U/L (ref 15–41)
Albumin: 2.2 g/dL — ABNORMAL LOW (ref 3.5–5.0)
Alkaline Phosphatase: 184 U/L — ABNORMAL HIGH (ref 38–126)
Anion gap: 9 (ref 5–15)
BILIRUBIN TOTAL: 1 mg/dL (ref 0.3–1.2)
BUN: 144 mg/dL — AB (ref 8–23)
CHLORIDE: 119 mmol/L — AB (ref 98–111)
CO2: 18 mmol/L — ABNORMAL LOW (ref 22–32)
Calcium: 8.5 mg/dL — ABNORMAL LOW (ref 8.9–10.3)
Creatinine, Ser: 2.21 mg/dL — ABNORMAL HIGH (ref 0.61–1.24)
GFR calc Af Amer: 31 mL/min — ABNORMAL LOW (ref >60)
GFR, EST NON AFRICAN AMERICAN: 26 mL/min — AB (ref >60)
Glucose, Bld: 130 mg/dL — ABNORMAL HIGH (ref 70–99)
Potassium: 4.1 mmol/L (ref 3.5–5.1)
Sodium: 146 mmol/L — ABNORMAL HIGH (ref 135–145)
Total Protein: 6.7 g/dL (ref 6.5–8.1)

## 2017-10-19 LAB — GLUCOSE, CAPILLARY
GLUCOSE-CAPILLARY: 121 mg/dL — AB (ref 70–99)
GLUCOSE-CAPILLARY: 77 mg/dL (ref 70–99)
GLUCOSE-CAPILLARY: 131 mg/dL — AB (ref 70–99)
GLUCOSE-CAPILLARY: 127 mg/dL — AB (ref 70–99)
Glucose-Capillary: 124 mg/dL — ABNORMAL HIGH (ref 70–99)
Glucose-Capillary: 145 mg/dL — ABNORMAL HIGH (ref 70–99)

## 2017-10-19 LAB — CBC
HEMATOCRIT: 31.6 % — AB (ref 39.0–52.0)
Hemoglobin: 9.4 g/dL — ABNORMAL LOW (ref 13.0–17.0)
MCH: 29.6 pg (ref 26.0–34.0)
MCHC: 29.7 g/dL — ABNORMAL LOW (ref 30.0–36.0)
MCV: 99.4 fL (ref 78.0–100.0)
Platelets: 244 10*3/uL (ref 150–400)
RBC: 3.18 MIL/uL — ABNORMAL LOW (ref 4.22–5.81)
RDW: 18.7 % — ABNORMAL HIGH (ref 11.5–15.5)
WBC: 9.7 10*3/uL (ref 4.0–10.5)

## 2017-10-19 MED ORDER — FREE WATER
200.0000 mL | Status: DC
  Administered 2017-10-19 – 2017-10-25 (×42): 200 mL

## 2017-10-19 MED ORDER — FAMOTIDINE 20 MG PO TABS
20.0000 mg | ORAL_TABLET | Freq: Every day | ORAL | Status: DC
  Administered 2017-10-20 – 2017-10-25 (×6): 20 mg
  Filled 2017-10-19 (×6): qty 1

## 2017-10-19 MED ORDER — VITAL AF 1.2 CAL PO LIQD
1000.0000 mL | ORAL | Status: DC
  Administered 2017-10-19: 1000 mL

## 2017-10-19 MED ORDER — FREE WATER
200.0000 mL | Status: DC
  Administered 2017-10-19 (×3): 200 mL

## 2017-10-19 MED ORDER — CARVEDILOL 6.25 MG PO TABS
6.2500 mg | ORAL_TABLET | Freq: Two times a day (BID) | ORAL | Status: DC
  Administered 2017-10-19 – 2017-10-25 (×9): 6.25 mg
  Filled 2017-10-19 (×9): qty 1

## 2017-10-19 NOTE — Plan of Care (Signed)
Sodium is WNL with free water , Re access pt every am for possible weaning while on Ventilator

## 2017-10-19 NOTE — Progress Notes (Signed)
RT worked with speech therapy to place an in-line passy muir valve. Dropped the peep when placing valve and patient tolerated well. Sats and vitals maintained stable throughout. No complications noted. RT will continue to monitor.

## 2017-10-19 NOTE — Progress Notes (Signed)
PULMONARY / CRITICAL CARE MEDICINE   Name: Johnny [name] MRN: [id number] DOB: Jul 03, 1934    ADMISSION DATE:  09/30/2017  REFERRING MD:  Dr. Suzi RootsMilled EDP  CHIEF COMPLAINT:  Shock  HISTORY OF PRESENT ILLNESS:   83 yo male former smoker with respiratory failure and altered mental status after alcohol binge.  Found to have pneumonia, septic shock and intubated in ER.  PAST MEDICAL HISTORY: ETOH, Withdrawal seizures, CAD s/p CABG, HFrEF, PAF, DM, CVA, HLD, HTN.  SUBJECTIVE:   Unable to tolerate vent weaning.  Needs sedation few times per day.  VITAL SIGNS: BP (!) 82/65   Pulse 90   Temp 98.7 F (37.1 C) (Axillary)   Resp 20   Ht 5' 10.5" (1.791 m)   Wt 183 lb 10.3 oz (83.3 kg)   SpO2 100%   BMI 25.98 kg/m   VENTILATOR SETTINGS: Vent Mode: PCV FiO2 (%):  [40 %] 40 % Set Rate:  [20 bmp] 20 bmp Vt Set:  [409[628 mL] 628 mL PEEP:  [5 cmH20] 5 cmH20 Plateau Pressure:  [18 cmH20-22 cmH20] 19 cmH20  INTAKE / OUTPUT: I/O last 3 completed shifts: In: 2771.3 [Other:350; NG/GT:2421.3] Out: 1475 [Urine:1475]  PHYSICAL EXAMINATION:   General - on vent Eyes - pupils reactive ENT - trach dressing clean Cardiac - regular, no murmur Chest - decreased BS, no wheeze, rales Abd - soft, non tender Ext - 1+ edema Skin - no rashes Neuro - follows simple commands  LABS:  BMET Recent Labs  Lab 10/17/17 0623 10/18/17 0345 10/19/17 0353  NA 153* 152* 146*  K 4.6 4.4 4.1  CL 120* 124* 119*  CO2 19* 19* 18*  BUN 166* 158* 144*  CREATININE 2.21* 2.23* 2.21*  GLUCOSE 114* 73 130*   Electrolytes Recent Labs  Lab 10/16/17 0720 10/17/17 0623 10/18/17 0345 10/19/17 0353  CALCIUM 9.0 8.9 8.8* 8.5*  MG 2.3  --   --   --    CBC Recent Labs  Lab 10/17/17 0623 10/18/17 0345 10/19/17 0353  WBC 9.6 9.8 9.7  HGB 9.8* 9.4* 9.4*  HCT 32.0* 31.5* 31.6*  PLT 244 239 244   Coag's No results for input(s): APTT, INR in the last 168 hours.  Sepsis Markers No results for input(s):  LATICACIDVEN, PROCALCITON, O2SATVEN in the last 168 hours. ABG No results for input(s): PHART, PCO2ART, PO2ART in the last 168 hours.   Liver Enzymes Recent Labs  Lab 10/17/17 0623 10/19/17 0353  AST 23 30  ALT 26 27  ALKPHOS 192* 184*  BILITOT 1.1 1.0  ALBUMIN 2.2* 2.2*     Cardiac Enzymes No results for input(s): TROPONINI, PROBNP in the last 168 hours.   Glucose Recent Labs  Lab 10/18/17 1151 10/18/17 1554 10/18/17 1932 10/18/17 2326 10/19/17 0325 10/19/17 0742  GLUCAP 101* 112* 109* 143* 121* 77   Imaging No results found.  STUDIES:  CT head 6/05 >> atrophy, chronic small vessel ischemia Echo 6/06 >> EF 20 to 25%  CULTURES: Blood 6/05 >> negative  ANTIBIOTICS: Cefepime 6/5 > 6/10 Vancomycin 6/5 > 6/12  SIGNIFICANT EVENTS: 6/05 admit 6/13 cardiology consulted 6/14 start lasix gtt 6/18 VT 6/19 cardiology s/o 6/21 palliative care consulted 6/26 change to #6 cuffed biovona trach due to pressure sore from #7 portex  LINES/TUBES: ETT 6/5>>>6/12 Rt IJ CVL 6/5>>> Trach 6/12>>>  DISCUSSION: 83 yo with aspiration pneumonia, acute hypoxic respiratory failure, acute on chronic systolic CHF with HFrEF in setting of alcoholic binge.  Failed to wean and  required trach.  Not making any progress with vent weaning.  ASSESSMENT / PLAN:  Acute hypoxic respiratory failure from aspiration pneumonia, pulmonary edema, and pleural effusions. Failure to wean s/p trach. - full vent support - f/u CXR intermittent - wound care for trach site - prn atropine for respiratory secretions  Acute on chronic systolic CHF with HFrEF. CAD s/p CABG, A fib/flutter. - continue ASA, lipitor, coreg, plavix  CKD 3. Hypernatremia. - f/u BMET  DM type II. - SSI with lantus  Hx of ETOH. - thiamine, folic acid  DVT - SQ heparin SUP - pepcid Nutrition - tube feeds Goals of care - DNR.  Family working with palliative care.  Not sure he will ever be able to wean off  vent.  PCCM will f/u intermittently for trach/vent management.  Call if help needed in between.  Coralyn Helling, MD [facility] 10/19/2017, 8:22 AM

## 2017-10-19 NOTE — Consult Note (Signed)
WOC Nurse wound consult note Evaluation [facility] in [facility] Reason for Consult: Re-evaluation of PIs to trach site and nare Wound type: Medical Devices Pressure Related Pressure Injury (MDRPI) Measurement: Right nare injury hypopigmented, with epithelial cells overlying.  Measures 0.4 cm x 0.4 cm.  I would consider this area healed at this time as there is no open, moist area at this time. The MDRPI below the trach measures 2.3 cm x 3.3 cm x unknown depth.  It is 99% thick, yellow/brown, stringy slough.  It is highly malodorous and with heavy exudate.  I will order Aquacel Ag+ to the site covered with a foam dressing daily to help combat microbes and absorb drainage. Monitor the wound area(s) for worsening of condition such as: Signs/symptoms of infection,  Increase in size,  Development of or worsening of odor, Development of pain, or increased pain at the affected locations.  Notify the medical team if any of these develop.  Helmut MusterSherry Maryanna Stuber, RN, MSN, CWOCN, CNS-BC, pager [phone]

## 2017-10-19 NOTE — Progress Notes (Addendum)
4:50pm CSW informed by [facility] that pt insurance is out of network and they would be unable to offer on patient without payor sources  [facility] DON reviewing referral but no answer as of this afternoon   9am Per palliative note patient family still undecided about comfort vs continued care for patient- knowing placement options would help them decide on a course of action- would only want to place patient if local option available  CSW faxed [facility] which [facility] the [facility] SN[facility] options for review- awaiting responses  Burna SisJenna H. Daly Whipkey, LCSW Clinical Social Worker [phone]

## 2017-10-19 NOTE — Progress Notes (Addendum)
Per Irving BurtonEmily with [facility] , insurance has denied initially the ltach stay.  Dr. Selena BattenKim is doing peer to peer.   Also CSW working on finding vent snf.

## 2017-10-19 NOTE — Progress Notes (Signed)
Patient ID: Johnny [name], male   DOB: 1935/01/24, 83 y.o.   MRN: [id number]                                                                PROGRESS NOTE                                                                                                                                                                                                             Patient Demographics:    Johnny [name], is a 83 y.o. male, DOB - Feb 07, 1935, GSU:[id number]  Admit date - 10/18/2017   Admitting Physician Aldean Jewett, MD  Outpatient Primary MD for the patient is Marletta Lor, MD  LOS - [phone]  Outpatient Specialists:     Chief Complaint  Patient presents with  . Shortness of Breath  . Altered Mental Status       Brief Narrative     83 y.o.M with CHF EF 40%, isch CM s/p CABG in 2003, NIDDM, HTN, alcoholism and paroxismal atrial fibrillation not on warfarin but s/p Ablation in 2009 presented with septic shock presumed respiratory source, aspiration vs CAP. Per admission H&P, he is now back to being mostly wheelchair bound, had been in the midst of a several day alcohol binge when he started coughin, then on the day of admission, developed sudden severe respiratory distress. When he arrived to ED, he was desaturating to 50%, required intubation promptly, then started on pressors and admitted to ICU.       Subjective:    Johnny [name] today has been tolerating tube feeding.  Pt is currently on vent.  Family on the fence regarding Peg tube and placement to LTAC vs hospical      Assessment  & Plan :    Active Problems:   Acute hypoxemic respiratory failure (HCC)   Goals of care, counseling/discussion   Sepsis ([name])   Septic shock ([place])   AKI (acute kidney injury) ([name])   Acute on chronic combined systolic and diastolic CHF (congestive heart failure) ([name])   Demand ischemia (HCC)   Pressure injury of skin   Palliative care by specialist   Generalized pain   Acute respiratory failure with hypoxemia  ([name])   DNR (do not resuscitate)    Acute hypoxic respiratory failure  likely secondary to multifocal pneumonia with effusions and congestive heart failure. Patient has been difficult to wean,on trach collar 40%. Mildly persistent leukocytosisbut trending down. Received IV diuresisinitially, currently off diuresisdue to renal failure. Patient had slow weaning from the ventilator and required tracheostomy. Palliative care and pulmonary on board.  Awaiting palliative care recommendations, appreciate input  Acute on chronic systolic congestive heart failure. Recent EF is 20 to 25%. CHF protocol. Patient will need to follow-up with cardiology at discharge with [name].  Off Lasix at this time.  Continue on aspirin, Lipitor,Coreg and Plavix.  Acute kidney injury on chronic kidney disease stage III.Baselinecreatinine of 1.2-1.3.Current creatinine levels of 2.2 trending down from peak of 2.9 but BUN is significantly elevated at 184.Off diuretics at this time. Renal function has not significantly improved but has plateaued.  Hypernatremialikely secondary to lack of free water. Sodium still trending up. Will increase the amount of free water flushes 6/27  will closely monitor for volume overload. If hypernatremia not improving will consider increase free water futher tomorrow  Atrial fibrillation/multifocal atrial tachycardia. CHADS2Vasc of 8 but has been considered high risk for anticoagulation.   Recent ventricular tachycardia.  Cont Coreg. Aim for Mag >2 and K>4. Currently stable.  History of coronary artery disease, ischemic cardiomyopathy. Continue Coreg, aspirin Plavix statin. Continue supportive care.   Diabetes mellitus type II.  Continue patient on sliding scale insulin, Lantus. Accu-Cheks diabetic diet. Continue tube feeding  Chronic normocytic anemia.  Closely monitor CBC.No need for blood transfusion  Thrombocytopenia:  Resolved  History of alcoholism:  Continue thiamine, folic acid and as needed Ativan for anxiety.   Anemia Stable Check cbc in am  Nutrition: continue tube feeds with free water flushes. Nutrition on board.  Increase rate of tube feed today   Pressure ulceration wound care tracheostomy collar. Prior physician spoke with pulmonary critical care 6/27 recommendation is local care and offloading pressure at the site. Wound care is been consulted.  DVT prophylaxis:Heparin  Code Status:DO NOT RESUSCITATE  Family Communication:None present, spoke yesterday with daughter she is not sure about LTAC vs Hospice, palliative medicine following   Disposition plan.Had a discussion with palliative care today. Palliative care team is having a discussion with the family regarding the goals of care.     Consultants:  CCM  Cardiology  WOC  Procedures:  Echo 6/6 LV EF: 20% - 25%  Antimicrobials:   Vancomycin 6/5 >> 6/10  Cefepime 6/5 >>6/12    Cultures:   6/5 blood cultures x2: NGTD            Anti-infectives (From admission, onward)   Start     Dose/Rate Route Frequency Ordered Stop   09/24/17 1600  vancomycin (VANCOCIN) IVPB 1000 mg/200 mL premix  Status:  Discontinued     1,000 mg 200 mL/hr over 60 Minutes Intravenous Every 24 hours 10/09/2017 1611 09/29/17 1058   09/24/17 1530  ceFEPIme (MAXIPIME) 2 g in sodium chloride 0.9 % 100 mL IVPB     2 g 200 mL/hr over 30 Minutes Intravenous Every 24 hours 10/02/2017 1611 09/30/17 1522   10/07/2017 1430  ceFEPIme (MAXIPIME) 2 g in sodium chloride 0.9 % 100 mL IVPB     2 g 200 mL/hr over 30 Minutes Intravenous  Once 10/07/2017 1417 09/20/2017 1622   09/19/2017 1430  vancomycin (VANCOCIN) IVPB 1000 mg/200 mL premix  Status:  Discontinued     1,000 mg 200 mL/hr over 60 Minutes Intravenous  Once 10/06/2017 1417 09/24/2017 1420  10/15/2017 1430  vancomycin (VANCOCIN) 1,500 mg in sodium chloride 0.9 % 500 mL IVPB      1,500 mg 250 mL/hr over 120 Minutes Intravenous  Once 09/25/2017 1420 10/03/2017 1630        Objective:   Vitals:   10/19/17 0335 10/19/17 0344 10/19/17 0400 10/19/17 0500  BP:   (!) 90/33 91/71  Pulse:    81  Resp:   (!) 25 (!) 22  Temp:  98.7 F (37.1 C)    TempSrc:  Axillary    SpO2:    100%  Weight: 83.3 kg (183 lb 10.3 oz)     Height:        Wt Readings from Last 3 Encounters:  10/19/17 83.3 kg (183 lb 10.3 oz)  05/05/17 81 kg (178 lb 9.6 oz)  01/20/17 76.8 kg (169 lb 3.4 oz)     Intake/Output Summary (Last 24 hours) at 10/19/2017 0539 Last data filed at 10/19/2017 0500 Gross per 24 hour  Intake 2151.67 ml  Output 1075 ml  Net 1076.67 ml     Physical Exam  Awake Alert, Oriented X 2, No new F.N deficits, Normal affect [place].AT,PERRAL Supple Neck,No JVD, No cervical lymphadenopathy appriciated.  Symmetrical Chest wall movement, Good air movement bilaterally, CTAB RRR,No Gallops,Rubs or new Murmurs, No Parasternal Heave +ve B.Sounds, Abd Soft, No tenderness, No organomegaly appriciated, No rebound - guarding or rigidity. No Cyanosis, Clubbing or edema, No new Rash or bruise    cortrack in place On vent     Data Review:    CBC Recent Labs  Lab 10/15/17 0648 10/16/17 0720 10/17/17 0623 10/18/17 0345 10/19/17 0353  WBC 15.3* 11.2* 9.6 9.8 9.7  HGB 8.8* 9.7* 9.8* 9.4* 9.4*  HCT 28.8* 31.7* 32.0* 31.5* 31.6*  PLT 245 227 244 239 244  MCV 98.6 97.5 99.7 101.0* 99.4  MCH 30.1 29.8 30.5 30.1 29.6  MCHC 30.6 30.6 30.6 29.8* 29.7*  RDW 19.6* 19.5* 19.6* 19.1* 18.7*    Chemistries  Recent Labs  Lab 10/15/17 0648 10/16/17 0720 10/17/17 0623 10/18/17 0345 10/19/17 0353  NA 150* 153* 153* 152* 146*  K 4.8 4.8 4.6 4.4 4.1  CL 120* 122* 120* 124* 119*  CO2 20* 21* 19* 19* 18*  GLUCOSE 166* 137* 114* 73 130*  BUN 187* 184* 166* 158* 144*  CREATININE 2.22* 2.21* 2.21* 2.23* 2.21*  CALCIUM 8.7* 9.0 8.9 8.8* 8.5*  MG  --  2.3  --   --   --   AST  --   --   23  --  30  ALT  --   --  26  --  27  ALKPHOS  --   --  192*  --  184*  BILITOT  --   --  1.1  --  1.0   ------------------------------------------------------------------------------------------------------------------ No results for input(s): CHOL, HDL, LDLCALC, TRIG, CHOLHDL, LDLDIRECT in the last 72 hours.  Lab Results  Component Value Date   HGBA1C 5.7 04/23/2017   ------------------------------------------------------------------------------------------------------------------ No results for input(s): TSH, T4TOTAL, T3FREE, THYROIDAB in the last 72 hours.  Invalid input(s): FREET3 ------------------------------------------------------------------------------------------------------------------ No results for input(s): VITAMINB12, FOLATE, FERRITIN, TIBC, IRON, RETICCTPCT in the last 72 hours.  Coagulation profile No results for input(s): INR, PROTIME in the last 168 hours.  No results for input(s): DDIMER in the last 72 hours.  Cardiac Enzymes No results for input(s): CKMB, TROPONINI, MYOGLOBIN in the last 168 hours.  Invalid input(s): CK ------------------------------------------------------------------------------------------------------------------    Component Value Date/Time  BNP 3,168.7 (H) 10/08/2017 1553    Inpatient Medications  Scheduled Meds: . aspirin  81 mg Per Tube Daily  . atorvastatin  20 mg Per Tube Daily  . carvedilol  6.25 mg Oral BID WC  . chlorhexidine gluconate (MEDLINE KIT)  15 mL Mouth Rinse BID  . clopidogrel  75 mg Per Tube Daily  . famotidine  20 mg Oral Daily  . feeding supplement (PRO-STAT SUGAR FREE 64)  30 mL Per Tube BID  . fentaNYL  50 mcg Transdermal Q72H  . folic acid  1 mg Per Tube Daily  . free water  200 mL Per Tube Q4H  . heparin  5,000 Units Subcutaneous Q8H  . insulin aspart  0-9 Units Subcutaneous Q4H  . insulin glargine  8 Units Subcutaneous QHS  . mouth rinse  15 mL Mouth Rinse 10 times per day  . multivitamin with  minerals  1 tablet Per Tube Daily  . thiamine  100 mg Per Tube Daily   Continuous Infusions: . sodium chloride Stopped (10/16/17 2310)  . feeding supplement (VITAL AF 1.2 CAL)     PRN Meds:.atropine, docusate, fentaNYL (SUBLIMAZE) injection, LORazepam, ondansetron, promethazine  Micro Results No results found for this or any previous visit (from the past 240 hour(s)).  Radiology Reports Ct Head Wo Contrast  Result Date: 10/05/2017 CLINICAL DATA:  Altered level of consciousness. Intubated patient, obtunded with no sedation. EXAM: CT HEAD WITHOUT CONTRAST TECHNIQUE: Contiguous axial images were obtained from the base of the skull through the vertex without intravenous contrast. COMPARISON:  09/21/2015 FINDINGS: Brain: Stable atrophy and chronic small vessel ischemia. No intracranial hemorrhage, mass effect, or midline shift. No evidence of cerebral edema. No hydrocephalus. The basilar cisterns are patent. No evidence of territorial infarct or acute ischemia. No extra-axial or intracranial fluid collection. Vascular: Atherosclerosis of skullbase vasculature without hyperdense vessel or abnormal calcification. Skull: No fracture or focal lesion. Sinuses/Orbits: No acute finding.  Prior cataract resection. Other: None. IMPRESSION: 1.  No acute intracranial abnormality. 2. Atrophy and chronic small vessel ischemia. Electronically Signed   By: Jeb Levering M.D.   On: 10/11/2017 20:34   Dg Chest Port 1 View  Result Date: 10/16/2017 CLINICAL DATA:  Dyspnea. EXAM: PORTABLE CHEST 1 VIEW COMPARISON:  Radiograph October 11, 2017. FINDINGS: Stable cardiomediastinal silhouette. Status post coronary artery bypass graft. Tracheostomy tube is in good position. Feeding tube is seen entering the stomach. Atherosclerosis of thoracic aorta is noted. No pneumothorax is noted. Stable bibasilar opacities are noted consistent with atelectasis and associated pleural effusions. Bony thorax is unremarkable. IMPRESSION: Stable  bibasilar opacities as described above. Electronically Signed   By: Marijo Conception, M.D.   On: 10/16/2017 07:11   Dg Chest Port 1 View  Result Date: 10/11/2017 CLINICAL DATA:  Respiratory distress. EXAM: PORTABLE CHEST 1 VIEW COMPARISON:  10/08/2017 and older exams FINDINGS: There is increased lung base opacity when compared to the most recent prior exam, most likely due to an increase in pleural fluid. Remainder of the lungs is clear. No evidence of pulmonary edema. No pneumothorax. Stable changes from prior CABG surgery. Tracheostomy tube and enteric feeding tube are stable and well positioned. IMPRESSION: 1. Interval increase in lung base opacity consistent with increased pleural effusions. No evidence of pulmonary edema. No other change. Electronically Signed   By: Lajean Manes M.D.   On: 10/11/2017 15:58   Dg Chest Port 1 View  Result Date: 10/08/2017 CLINICAL DATA:  Acute respiratory failure EXAM: PORTABLE CHEST  1 VIEW COMPARISON:  10/07/2017 FINDINGS: Tracheostomy tube, feeding catheter and right jugular central line are again seen and stable. Cardiac shadow is stable. Postsurgical changes are again seen. Small bilateral pleural effusions are noted left greater than right. Mild left basilar atelectasis is noted as well. IMPRESSION: No significant interval change from the prior exam. Small bilateral pleural effusions are again seen. Electronically Signed   By: Inez Catalina M.D.   On: 10/08/2017 08:29   Dg Chest Port 1 View  Result Date: 10/07/2017 CLINICAL DATA:  Hypoxia EXAM: PORTABLE CHEST 1 VIEW COMPARISON:  October 05, 2017 FINDINGS: Tracheostomy catheter tip is 8.0 cm above the carina. Central catheter tip is in the superior vena cava near the cavoatrial junction. Feeding tube tip is below the diaphragm. No pneumothorax. There are small pleural effusions bilaterally with bibasilar atelectasis. There is cardiomegaly with pulmonary vascularity within normal limits. Patient is status post coronary  artery bypass grafting. There is aortic atherosclerosis. No adenopathy. No bone lesions. IMPRESSION: Tube and catheter positions as described without pneumothorax. Cardiomegaly with small pleural effusions bilaterally. There is bibasilar atelectasis. No frank edema or consolidation evident. There is aortic atherosclerosis. Aortic Atherosclerosis (ICD10-I70.0). Electronically Signed   By: Lowella Grip III M.D.   On: 10/07/2017 07:32   Dg Chest Port 1 View  Result Date: 10/05/2017 CLINICAL DATA:  Respiratory failure, shortness of breath. EXAM: PORTABLE CHEST 1 VIEW COMPARISON:  Portable chest x-ray of October 04, 2017 FINDINGS: The lungs are adequately inflated. The tracheostomy tube tip projects between the clavicular heads. Bibasilar atelectasis or pneumonia is present greatest on the left. There small bilateral pleural effusions which appears stable. The heart is top-normal in size. The pulmonary vascularity is mildly engorged. The patient has undergone previous CABG. A feeding tube is present whose tip projects below the inferior margin of the image. The right internal jugular venous catheter tip projects over the midportion of the SVC. IMPRESSION: Fairly stable changes of CHF with mild interstitial edema and small bilateral pleural effusions. There may be bibasilar atelectasis greatest on the left. Electronically Signed   By: David  Martinique M.D.   On: 10/05/2017 07:11   Dg Chest Port 1 View  Result Date: 10/04/2017 CLINICAL DATA:  Respiratory failure EXAM: PORTABLE CHEST 1 VIEW COMPARISON:  1 day prior FINDINGS: Right internal jugular line tip unchanged. Feeding tube extends beyond the inferior aspect of the film. Tracheostomy appropriately positioned. Midline trachea. Mild cardiomegaly. Atherosclerosis in the transverse aorta. Small layering bilateral pleural effusions. Shrapnel about the lower chest. No pneumothorax. Mild interstitial edema is similar. Bibasilar airspace disease is not significantly  changed. IMPRESSION: No significant change since one day prior. Mild congestive heart failure with small bilateral pleural effusions and bibasilar airspace disease-likely atelectasis. Electronically Signed   By: Abigail Miyamoto M.D.   On: 10/04/2017 07:21   Dg Chest Port 1 View  Result Date: 10/03/2017 CLINICAL DATA:  Respiratory failure EXAM: PORTABLE CHEST 1 VIEW COMPARISON:  10/02/2017 FINDINGS: Support devices are stable. Layering bilateral effusions with bilateral perihilar and lower lobe airspace opacities, similar prior study. Mild cardiomegaly. Prior CABG. IMPRESSION: Layering bilateral effusions with bilateral lower lobe atelectasis or infiltrates. Cardiomegaly. No significant change. Electronically Signed   By: Rolm Baptise M.D.   On: 10/03/2017 07:17   Dg Chest Port 1 View  Result Date: 10/02/2017 CLINICAL DATA:  Respiratory failure. EXAM: PORTABLE CHEST 1 VIEW COMPARISON:  10/01/2017.  09/30/2017. FINDINGS: Tracheostomy tube, feeding tube, right IJ line in stable position. Prior CABG. Cardiomegaly  with diffuse bilateral pulmonary infiltrates/edema and bilateral pleural effusions. Findings most consistent with CHF. Vascular calcification noted. IMPRESSION: 1.  Lines and tubes in stable position. 2. Prior CABG. Cardiomegaly with diffuse bilateral pulmonary infiltrates/edema bilateral pleural effusions. Findings most consistent with CHF. Electronically Signed   By: Marcello Moores  Register   On: 10/02/2017 06:22   Dg Chest Port 1 View  Result Date: 10/01/2017 CLINICAL DATA:  Endotracheal tube placement EXAM: PORTABLE CHEST 1 VIEW COMPARISON:  09/30/2017 FINDINGS: Tracheostomy in good position. Central venous catheter tip in the SVC unchanged. Feeding tube enters the stomach with the tip not visualized. Bibasilar airspace disease left greater than right is unchanged. Small bilateral effusions also unchanged. Negative for edema. IMPRESSION: Bibasilar airspace disease and bilateral pleural effusions left  greater than right unchanged from the prior study. Electronically Signed   By: Franchot Gallo M.D.   On: 10/01/2017 07:21   Dg Chest Port 1 View  Result Date: 09/30/2017 CLINICAL DATA:  Tracheostomy status.  Pleural effusions. EXAM: PORTABLE CHEST 1 VIEW 3:15 p.m. COMPARISON:  Chest x-ray dated 09/30/2017 at 4:58 a.m. and chest x-ray dated 09/29/2017 FINDINGS: Since the prior study the endotracheal tube has been removed and a tracheostomy tube has been inserted and appears in good position in the AP projection. Central catheter tip is at the cavoatrial junction. Feeding tube tip is below the diaphragm. Bilateral pleural effusions are less prominent than on the prior study of this same date. Chronic cardiomegaly. Aortic atherosclerosis. Pulmonary vascularity appears normal. CABG. IMPRESSION: 1. Tracheostomy tube appears in good position. 2. Decreased bilateral pleural effusions. Haziness in the lung bases has resolved and may have represented mild pulmonary edema. 3.  Aortic Atherosclerosis (ICD10-I70.0). Electronically Signed   By: Lorriane Shire M.D.   On: 09/30/2017 15:41   Dg Chest Port 1 View  Result Date: 09/30/2017 CLINICAL DATA:  Hypoxia EXAM: PORTABLE CHEST 1 VIEW COMPARISON:  September 29, 2017 FINDINGS: Endotracheal tube tip is 3.5 cm above the carina. Nasogastric tube and feeding tube tips are below the diaphragm. Central catheter tip is in the superior vena cava near the cavoatrial junction. No pneumothorax. There are bilateral pleural effusions with patchy airspace consolidation in both lower lobes. Heart is mildly enlarged with pulmonary vascularity normal. Patient is status post coronary artery bypass grafting. There is aortic atherosclerosis. No evident bone lesions. IMPRESSION: Tube and catheter positions as described. Small pneumothorax seen previously on the right is no longer evident. There are bilateral pleural effusions with airspace consolidation in both lower lobes. Suspect a degree of  pneumonia in the lung bases, although alveolar edema could present in this manner. Both entities may be present concurrently. Stable cardiac prominence. There is aortic atherosclerosis. Aortic Atherosclerosis (ICD10-I70.0). Electronically Signed   By: Lowella Grip III M.D.   On: 09/30/2017 07:06   Dg Chest Port 1 View  Result Date: 09/29/2017 CLINICAL DATA:  Endotracheal tube EXAM: PORTABLE CHEST 1 VIEW COMPARISON:  09/28/2017 FINDINGS: Endotracheal tube in good position. Feeding tube in place with the tip not visualized but entering the stomach. NG tube also enters the stomach. Right jugular central venous catheter tip at the cavoatrial junction unchanged. Probable small right apical pneumothorax. Recommend follow-up for confirmation. Bibasilar atelectasis and effusion unchanged.  Negative for edema. IMPRESSION: Endotracheal tube remains in good position. Bibasilar atelectasis and effusion unchanged Probable small right apical pneumothorax. Recommend follow-up chest x-ray to confirm. Electronically Signed   By: Franchot Gallo M.D.   On: 09/29/2017 08:47   Dg Chest  Port 1 View  Result Date: 09/28/2017 CLINICAL DATA:  Respiratory failure EXAM: PORTABLE CHEST 1 VIEW COMPARISON:  Chest x-rays dated 09/27/2017, 09/26/2017 and 11/16/2016. FINDINGS: Endotracheal tube appears well positioned with tip approximately 3 cm above the carina. Enteric tube passes below the diaphragm. RIGHT IJ central line appears adequately positioned with tip at the level of the mid/lower SVC. Stable cardiomegaly. Stable bibasilar pleural effusions with probable associated atelectasis. No new lung findings. No pneumothorax seen. IMPRESSION: 1. No change compared to yesterday's chest x-ray. Persistent bibasilar pleural effusions, small to moderate in size, with probable associated atelectasis. 2. Support apparatus appears appropriately positioned. Electronically Signed   By: Franki Cabot M.D.   On: 09/28/2017 08:40   Dg Chest Port  1 View  Result Date: 09/27/2017 CLINICAL DATA:  Respiratory failure EXAM: PORTABLE CHEST 1 VIEW COMPARISON:  Chest radiograph from one day prior. FINDINGS: Endotracheal tube tip is 3.4 cm above the carina. Enteric tubes enter stomach with the tips not seen on this image. Right internal jugular central venous catheter terminates at the cavoatrial junction. Intact sternotomy wires. CABG clips overlie the mediastinum. Stable cardiomediastinal silhouette with mild cardiomegaly. No pneumothorax. Stable small bilateral pleural effusions with bibasilar atelectasis. No pulmonary edema. IMPRESSION: 1. Support structures as detailed.  No pneumothorax. 2. Stable small bilateral pleural effusions with bibasilar atelectasis. No pulmonary edema. Electronically Signed   By: Ilona Sorrel M.D.   On: 09/27/2017 09:33   Dg Chest Port 1 View  Result Date: 09/26/2017 CLINICAL DATA:  Pneumonia, ventilatory support EXAM: PORTABLE CHEST 1 VIEW COMPARISON:  09/25/2017 FINDINGS: Endotracheal tube 5.7 cm above the carina. Right IJ central line tip lower SVC level. NG tube within the stomach, tip not visualized. Previous coronary bypass changes noted. Mild cardiomegaly with similar bibasilar consolidation and pleural effusions. Basilar pneumonia not excluded. No pneumothorax. Aorta atherosclerotic. IMPRESSION: Similar bibasilar consolidation and pleural effusions concerning for pneumonia. Stable support apparatus No pneumothorax Electronically Signed   By: Jerilynn Mages.  Shick M.D.   On: 09/26/2017 08:48   Dg Chest Port 1 View  Result Date: 09/25/2017 CLINICAL DATA:  Follow-up pneumonia. EXAM: PORTABLE CHEST 1 VIEW COMPARISON:  Chest radiograph performed 09/24/2017 FINDINGS: The patient's endotracheal tube is seen ending 2 cm above the carina. An enteric tube is noted extending below the diaphragm. The right IJ line is noted ending about the distal SVC. Worsening bibasilar and left perihilar airspace opacification raises concern for worsening  pneumonia. Underlying small bilateral pleural effusions are noted. No pneumothorax is seen. The cardiomediastinal silhouette is normal in size. The patient is status post median sternotomy, with evidence of prior CABG. No acute osseous abnormalities are identified. IMPRESSION: 1. Endotracheal tube seen ending 2 cm above the carina. 2. Worsening bibasilar and left perihilar airspace opacification raises concern for worsening pneumonia. Underlying small bilateral pleural effusions noted. Electronically Signed   By: Garald Balding M.D.   On: 09/25/2017 03:33   Dg Chest Port 1 View  Result Date: 09/24/2017 CLINICAL DATA:  PNA EXAM: PORTABLE CHEST 1 VIEW COMPARISON:  10/01/2017 FINDINGS: The patient has a RIGHT-sided IJ central line, tip overlying the level of superior vena cava. Endotracheal tube is in place with tip approximately 3.5 centimeters above the carina. Nasogastric tube is in place beyond the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

## 2017-10-19 NOTE — Evaluation (Signed)
[facility] Speaking Valve - Evaluation Patient Details  Name: Johnny [name] MRN: [id number] Date of Birth: 1934/05/24  Today's Date: 10/19/2017 Time: 1236-1250 SLP Time Calculation (min) (ACUTE ONLY): 14 min  Past Medical History:  Past Medical History:  Diagnosis Date  . Acute alcoholic hepatitis 05/20/2010  . Altered mental status 04/01/2012  . Anemia of other chronic disease 08/16/2007  . Arthritis   . CAD (coronary artery disease)    a. s/p CABG, notes unclear - 1998 or 2003?  Marland Kitchen. Chronic low back pain   . Coagulopathy (HCC)   . Diabetes mellitus (HCC)   . Essential hypertension   . ETOH abuse   . Hallucination 04/01/2012  . History of pneumonia   . History of stroke   . Hyperlipidemia   . Paroxysmal atrial fibrillation (HCC)   . Paroxysmal atrial flutter (HCC)    a. s/p ablation 2007.  Marland Kitchen. PEA (Pulseless electrical activity) (HCC)    a. h/o PEA cardiac arrest 04/2012 (felt r/t PNA, acidosis, hypoxic resp failure in setting of alcohol withdrawal; required tracheostomy)  . Protein calorie malnutrition (HCC)   . Seizures (HCC) 04/01/2012   Past Surgical History:  Past Surgical History:  Procedure Laterality Date  . CATARACT EXTRACTION W/ INTRAOCULAR LENS  IMPLANT, BILATERAL     "years ago" (04/01/2012)  . CORONARY ARTERY BYPASS GRAFT  ~ 2003   CABG X4  . ESOPHAGOGASTRODUODENOSCOPY  04/13/2012   Procedure: ESOPHAGOGASTRODUODENOSCOPY (EGD);  Surgeon: Florencia Reasonsobert V Buccini, MD;  [facility];  Service: Endoscopy;  Laterality: N/A;  push peg  . LEFT HEART CATH AND CORS/GRAFTS ANGIOGRAPHY N/A 11/17/2016   Procedure: Left Heart Cath and Cors/Grafts Angiography;  Surgeon: Corky CraftsVaranasi, Jayadeep S, MD;  [facility];  Service: Cardiovascular;  Laterality: N/A;  . PEG PLACEMENT  04/13/2012   Procedure: PERCUTANEOUS ENDOSCOPIC GASTROSTOMY (PEG) PLACEMENT;  Surgeon: Florencia Reasonsobert V Buccini, MD;  Location: [facility] ENDOSCOPY;  Service: Endoscopy;  Laterality: N/A;   HPI:  Pt is an 83 yo  male former smoker admitted with respiratory failure and AMS after alcohol binge.  Found to have pneumonia and septic shock requiring ETT 6/5, trach 6/12. Family has been trying to make decisions regarding GOC. PMH includes: seizures, PEA, afib, CVA, PNA, hallucinations, ETOH abuse, HTN, DM, CAD, acute alcoholic hepatitis, chronic low back pain   Assessment / Plan / Recommendation Clinical Impression  Pt was seen with RT to attempt inline PMV to try to facilitate communication and facilitate pt's ability to participate in GOC discussions. Cuff was gradually deflated over the course of ~2 minutes with intermittent, dry coughing noted. SLP attempted provided Mod-Max cues to initiate phonation with cuff deflated and then with inline PMV donned (PEEP dropped to 0). Vocal adduction was heard during coughing and spontaneous vocalizations, but he did not make eye contact and made no volitional attempts to verbalize or respond to questioning. After only a few minutes with valve in place he started to close his eyes; eval was kept brief given limited pt interaction, although he did appear comfortable with valve in place. Although pt would be physically able to produce phonation for communication, his mentation would limit his ability to participate in functional conversation. If family would like to try speaking with him, SLP could coordinate with RT to place inline valve at that time. Otherwise, will follow along for decisions re: GOC. SLP Visit Diagnosis: Aphonia (R49.1)    SLP Assessment  Patient needs continued Speech Lanaguage Pathology Services    Follow Up  Recommendations  Other (comment)(pending GOC)    Frequency and Duration min 1 x/week  2 weeks    PMSV Trial PMSV was placed for: 2 min Able to redirect subglottic air through upper airway: Yes Able to Attain Phonation: Yes Voice Quality: Normal;Other (comment)(during spontaneous vocalizations) Able to Expectorate Secretions: No Level of  Secretion Expectoration with PMSV: Not observed Breath Support for Phonation: Mildly decreased Intelligibility: Unable to assess (comment) Respirations During Trial: (20s, reached low 30s briefly) SpO2 During Trial: (high 90s) Pulse During Trial: (mid 80s) Behavior: Poor eye contact;No attempt to communicate   Tracheostomy Tube       Vent Dependency  Vent Mode: PCV Set Rate: 20 bmp PEEP: 5 cmH20 FiO2 (%): 40 %    Cuff Deflation Trial  GO Tolerated Cuff Deflation: Yes Length of Time for Cuff Deflation Trial: 5 min Behavior: Poor eye contact        Maxcine Ham 10/19/2017, 1:19 PM   Maxcine Ham, M.A. CCC-SLP [phone]

## 2017-10-19 DEATH — deceased

## 2017-10-20 ENCOUNTER — Inpatient Hospital Stay (HOSPITAL_COMMUNITY): Payer: Medicare HMO

## 2017-10-20 LAB — CBC
HEMATOCRIT: 30.8 % — AB (ref 39.0–52.0)
Hemoglobin: 9.3 g/dL — ABNORMAL LOW (ref 13.0–17.0)
MCH: 30.2 pg (ref 26.0–34.0)
MCHC: 30.2 g/dL (ref 30.0–36.0)
MCV: 100 fL (ref 78.0–100.0)
Platelets: 205 10*3/uL (ref 150–400)
RBC: 3.08 MIL/uL — ABNORMAL LOW (ref 4.22–5.81)
RDW: 18.6 % — ABNORMAL HIGH (ref 11.5–15.5)
WBC: 10 10*3/uL (ref 4.0–10.5)

## 2017-10-20 LAB — GLUCOSE, CAPILLARY
GLUCOSE-CAPILLARY: 155 mg/dL — AB (ref 70–99)
GLUCOSE-CAPILLARY: 167 mg/dL — AB (ref 70–99)
Glucose-Capillary: 108 mg/dL — ABNORMAL HIGH (ref 70–99)
Glucose-Capillary: 130 mg/dL — ABNORMAL HIGH (ref 70–99)
Glucose-Capillary: 124 mg/dL — ABNORMAL HIGH (ref 70–99)

## 2017-10-20 LAB — COMPREHENSIVE METABOLIC PANEL
ALBUMIN: 2.1 g/dL — AB (ref 3.5–5.0)
ALK PHOS: 193 U/L — AB (ref 38–126)
ALT: 26 U/L (ref 0–44)
AST: 30 U/L (ref 15–41)
Anion gap: 10 (ref 5–15)
BILIRUBIN TOTAL: 1 mg/dL (ref 0.3–1.2)
BUN: 131 mg/dL — ABNORMAL HIGH (ref 8–23)
CALCIUM: 8.6 mg/dL — AB (ref 8.9–10.3)
CO2: 21 mmol/L — AB (ref 22–32)
Chloride: 118 mmol/L — ABNORMAL HIGH (ref 98–111)
Creatinine, Ser: 2.06 mg/dL — ABNORMAL HIGH (ref 0.61–1.24)
GFR calc non Af Amer: 29 mL/min — ABNORMAL LOW (ref >60)
GFR, EST AFRICAN AMERICAN: 33 mL/min — AB (ref >60)
Glucose, Bld: 117 mg/dL — ABNORMAL HIGH (ref 70–99)
Potassium: 4.1 mmol/L (ref 3.5–5.1)
SODIUM: 149 mmol/L — AB (ref 135–145)
TOTAL PROTEIN: 6.7 g/dL (ref 6.5–8.1)

## 2017-10-20 MED ORDER — VITAL AF 1.2 CAL PO LIQD
1000.0000 mL | ORAL | Status: DC
  Administered 2017-10-20 – 2017-10-24 (×4): 1000 mL

## 2017-10-20 NOTE — Progress Notes (Signed)
eLink Physician-Brief Progress Note Patient Name: Johnny NewcomerDan E Latini DOB: November 19, 1934 MRN: [id number]   Date of Service  10/20/2017  HPI/Events of Note  Request to renew bilateral soft wrist restraints.   eICU Interventions  Will renew soft bilateral wrist restraints.      Intervention Category Major Interventions: Other:  [name],Jannatul Wojdyla Eugene 10/20/2017, 12:57 AM

## 2017-10-20 NOTE — Progress Notes (Signed)
Nutrition Follow-up  DOCUMENTATION CODES:   Not applicable  INTERVENTION:   Tube Feeding:  Increase Vital AF 1.2 to 35 ml/hr Goal rate of 65 ml/hr If tolerating, increase by 10 mL q 8 hours until goal rate of 65 ml/hr Provide 1872 kcals, 117 g of protein and 1264 mL of free water   NUTRITION DIAGNOSIS:   Inadequate oral intake related to inability to eat as evidenced by estimated needs.  Being addressed via TF  GOAL:   Patient will meet greater than or equal to 90% of their needs  Being addressed via TF  MONITOR:   TF tolerance, Vent status, Labs, Skin, Weight trends  REASON FOR ASSESSMENT:   Consult Enteral/tube feeding initiation and management  ASSESSMENT:   Patient with PMH significant for ETOH abuse, CAD s/p CABG, CHF, alcoholic hepatitis, PAF s/p ablation, DM, and cardiac arrest in setting of withdrawals. Presents this admission after a several day alcohol binge. Admitted for acute metabolic encephalopathy and respiratory failure requiring intubation.   Remains on vent support via trach MV: 15.6 L/min Temp (24hrs), Avg:98 F (36.7 C), Min:97.6 F (36.4 C), Max:98.6 F (37 C)  Palliative care following, pt DNR, pt has indicated at times that he is tired and wants to stop aggressive measures, pulling out tubes/lines, currently with soft wrist restraints. Currently family [facility] continued care [facility] Noted plan for PEG tube placement CT abdomen pending  Vital AF 1.2 @ 25 ml/hr, Pro-Stat 30 mL BID, free water 200 mL q 3 hours via Cortrak tube  Noted vomiting several days ago, TF held at that time  Weight relatively stable  Labs: sodium 149, BUN 131, Creatinine 2.06 Meds: MVI, thiamine, folic acid, ss novolog, lantus  Diet Order:   Diet Order           Diet NPO time specified  Diet effective midnight          EDUCATION NEEDS:   Not appropriate for education at this time  Skin:  Skin Assessment: Skin Integrity Issues: Skin  Integrity Issues:: Stage II, Stage III Stage II: buttock, forehead (device related) Stage III: nare (device related), neck (device related) Other: MASD: buttock  Last BM:  7/2 rectal tube  Height:   Ht Readings from Last 1 Encounters:  09/29/2017 5' 10.5" (1.791 m)    Weight:   Wt Readings from Last 1 Encounters:  10/20/17 184 lb 4.9 oz (83.6 kg)    Ideal Body Weight:  75.5 kg  BMI:  Body mass index is 26.07 kg/m.  Estimated Nutritional Needs:   Kcal:  1910 kcals  Protein:  115-130 g  Fluid:  >1.6 L/day   Romelle Starcherate Bensen Chadderdon MS, RD, LDN, CNSC [phone] Pager  [phone] Weekend/On-Call Pager

## 2017-10-20 NOTE — Progress Notes (Signed)
Patient ID: Johnny [name], male   DOB: 07-14-1934, 83 y.o.   MRN: [id number]                                                                PROGRESS NOTE                                                                                                                                                                                                             Patient Demographics:    Johnny [name], is a 83 y.o. male, DOB - 10/13/1934, GMW:[id number]  Admit date - 10/07/2017   Admitting Physician Aldean Jewett, MD  Outpatient Primary MD for the patient is Marletta Lor, MD  LOS - [phone]  Outpatient Specialists:     Chief Complaint  Patient presents with  . Shortness of Breath  . Altered Mental Status       Brief Narrative    83 y.o.M with CHF EF 40%, isch CM s/p CABG in 2003, NIDDM, HTN, alcoholism and paroxismal atrial fibrillation not on warfarin but s/p Ablation in 2009 presented with septic shock presumed respiratory source, aspiration vs CAP. Per admission H&P, he is now back to being mostly wheelchair bound, had been in the midst of a several day alcohol binge when he started coughin, then on the day of admission, developed sudden severe respiratory distress. When he arrived to ED, he was desaturating to 50%, required intubation promptly, then started on pressors and admitted to ICU.         Subjective:    Daelyn Mozer today has been afebrile.  Tolerating tube feed at current rate.  I had an extensive conversation with daughter yesterday apparently she wants LTAC  I spoke with Market researcher for [employer] and they are willing to pay for LTAC at [facility] as long as he has a PEG tube     Assessment  & Plan :    Active Problems:   Acute hypoxemic respiratory failure (HCC)   Goals of care, counseling/discussion   Sepsis ([place])   Septic shock ([place])   AKI (acute kidney injury) ([place])   Acute on chronic combined systolic and diastolic CHF (congestive heart failure) ([place])   Demand  ischemia (HCC)   Pressure injury of skin   Palliative care by specialist  Generalized pain   Acute respiratory failure with hypoxemia (HCC)   DNR (do not resuscitate)    Acute hypoxic respiratory failure likely secondary to multifocal pneumonia with effusions and congestive heart failure.Patient has been difficult to wean,on trach collar 40%. Mildly persistent leukocytosisbut trending down. Received IV diuresisinitially, currently off diuresisdue to renal failure. Patient had slow weaning from the ventilator and required tracheostomy. Palliative care and pulmonary on board. Spoke with daughter 7/1,  She wants LTAC Spoke with medical director for [employer] , wants PEG and then insurance will pay for LTAC  Acute on chronic systolic congestive heart failure.Recent EF is 20 to 25%. CHF protocol. Patient will need to follow-up with cardiology at discharge with Dr. Jordan.  Off Lasix at this time.  Continue on aspirin, Lipitor,Coreg and Plavix.  Acute kidney injury on chronic kidney disease stage III.Baselinecreatinine of 1.2-1.3.Current creatinine levels of 2.2 trending down from peak of 2.9 but BUN is significantly elevated at 184.Off diuretics at this time. Renal function has not significantly improved but has plateaued.  Hypernatremialikely secondary to lack of free water. Sodium still trending up.Will increase the amount of free water flushes6/27will closely monitor for volume overload. If hypernatremia not improving will consider increase free water futher tomorrow  Atrial fibrillation/multifocal atrial tachycardia.CHADS2Vasc of 8 but has been considered high risk for anticoagulation.   Recent ventricular tachycardia. ContCoreg. Aim for Mag >2 and K>4. Currently stable.  History of coronary artery disease, ischemic cardiomyopathy. Continue Coreg, aspirin Plavix statin. Continue supportive care.   Diabetes mellitus type II. Continue patient  on sliding scale insulin, Lantus. Accu-Cheks diabetic diet. Continue tube feeding  Chronic normocytic anemia.  Closely monitor CBC.No need for blood transfusion  Thrombocytopenia: Resolved  History of alcoholism: Continue thiamine, folic acid and as needed Ativan for anxiety.   Anemia Stable Check cbc in am  Nutrition: continue tube feeds with free water flushes. Nutrition on board.  Increase rate of tube feed today again this time to 30mL per hour PEG tube placement requested D/w IR, appreciate input.    Pressure ulceration wound care tracheostomy collar.Prior physicianspoke with pulmonary critical care 6/27recommendation is local care and offloading pressure at the site. Wound care is been consulted.  DVT prophylaxis:Heparin  Code Status:DO NOT RESUSCITATE  Family Communication:None present, spoke yesterday with daughter she is not sure about LTAC vs Hospice, palliative medicine following   Disposition plan.Had a discussion with palliative care today. Palliative care team is having a discussion with the family regarding the goals of care, Daughter has decided on LTAC at [facility].    Consultants:  CCM  Cardiology  WOC  Procedures:  Echo 6/6 LV EF: 20% - 25%  Antimicrobials:   Vancomycin 6/5 >> 6/10  Cefepime 6/5 >>6/12    Cultures:   6/5 blood cultures x2: NGTD         Anti-infectives (From admission, onward)   Start     Dose/Rate Route Frequency Ordered Stop   09/24/17 1600  vancomycin (VANCOCIN) IVPB 1000 mg/200 mL premix  Status:  Discontinued     1,000 mg 200 mL/hr over 60 Minutes Intravenous Every 24 hours 09/26/2017 1611 09/29/17 1058   09/24/17 1530  ceFEPIme (MAXIPIME) 2 g in sodium chloride 0.9 % 100 mL IVPB     2 g 200 mL/hr over 30 Minutes Intravenous Every 24 hours 10/09/2017 1611 09/30/17 1522   10/05/2017 1430  ceFEPIme (MAXIPIME) 2 g in sodium chloride 0.9 % 100 mL IVPB     2 g 200 mL/hr   over 30  Minutes Intravenous  Once 09/30/2017 1417 10/05/2017 1622   10/06/2017 1430  vancomycin (VANCOCIN) IVPB 1000 mg/200 mL premix  Status:  Discontinued     1,000 mg 200 mL/hr over 60 Minutes Intravenous  Once 09/26/2017 1417 10/09/2017 1420   09/21/2017 1430  vancomycin (VANCOCIN) 1,500 mg in sodium chloride 0.9 % 500 mL IVPB     1,500 mg 250 mL/hr over 120 Minutes Intravenous  Once 09/22/2017 1420 09/29/2017 1630        Objective:   Vitals:   10/20/17 0426 10/20/17 0500 10/20/17 0600 10/20/17 0732  BP:  (!) 86/50 (!) 88/39   Pulse: 84   81  Resp: (!) 22 (!) 21 20 (!) 23  Temp:      TempSrc:      SpO2: 100%   100%  Weight:  83.6 kg (184 lb 4.9 oz)    Height:        Wt Readings from Last 3 Encounters:  10/20/17 83.6 kg (184 lb 4.9 oz)  05/05/17 81 kg (178 lb 9.6 oz)  01/20/17 76.8 kg (169 lb 3.4 oz)     Intake/Output Summary (Last 24 hours) at 10/20/2017 0817 Last data filed at 10/20/2017 0600 Gross per 24 hour  Intake 1550 ml  Output 825 ml  Net 725 ml     Physical Exam  Awake Alert, Oriented X 3, No new F.N deficits, Normal affect [place].AT,PERRAL Supple Neck,No JVD, No cervical lymphadenopathy appriciated.  Symmetrical Chest wall movement, Good air movement bilaterally, CTAB RRR,No Gallops,Rubs or new Murmurs, No Parasternal Heave +ve B.Sounds, Abd Soft, No tenderness, No organomegaly appriciated, No rebound - guarding or rigidity. No Cyanosis, Clubbing or edema, No new Rash or bruise    Tracheostomy , cortrack in place    Data Review:    CBC Recent Labs  Lab 10/16/17 0720 10/17/17 0623 10/18/17 0345 10/19/17 0353 10/20/17 0716  WBC 11.2* 9.6 9.8 9.7 10.0  HGB 9.7* 9.8* 9.4* 9.4* 9.3*  HCT 31.7* 32.0* 31.5* 31.6* 30.8*  PLT 227 244 239 244 205  MCV 97.5 99.7 101.0* 99.4 100.0  MCH 29.8 30.5 30.1 29.6 30.2  MCHC 30.6 30.6 29.8* 29.7* 30.2  RDW 19.5* 19.6* 19.1* 18.7* 18.6*    Chemistries  Recent Labs  Lab 10/15/17 0648 10/16/17 0720 10/17/17 0623 10/18/17 0345  10/19/17 0353  NA 150* 153* 153* 152* 146*  K 4.8 4.8 4.6 4.4 4.1  CL 120* 122* 120* 124* 119*  CO2 20* 21* 19* 19* 18*  GLUCOSE 166* 137* 114* 73 130*  BUN 187* 184* 166* 158* 144*  CREATININE 2.22* 2.21* 2.21* 2.23* 2.21*  CALCIUM 8.7* 9.0 8.9 8.8* 8.5*  MG  --  2.3  --   --   --   AST  --   --  23  --  30  ALT  --   --  26  --  27  ALKPHOS  --   --  192*  --  184*  BILITOT  --   --  1.1  --  1.0   ------------------------------------------------------------------------------------------------------------------ No results for input(s): CHOL, HDL, LDLCALC, TRIG, CHOLHDL, LDLDIRECT in the last 72 hours.  Lab Results  Component Value Date   HGBA1C 5.7 04/23/2017   ------------------------------------------------------------------------------------------------------------------ No results for input(s): TSH, T4TOTAL, T3FREE, THYROIDAB in the last 72 hours.  Invalid input(s): FREET3 ------------------------------------------------------------------------------------------------------------------ No results for input(s): VITAMINB12, FOLATE, FERRITIN, TIBC, IRON, RETICCTPCT in the last 72 hours.  Coagulation profile No results for input(s): INR, PROTIME  in the last 168 hours.  No results for input(s): DDIMER in the last 72 hours.  Cardiac Enzymes No results for input(s): CKMB, TROPONINI, MYOGLOBIN in the last 168 hours.  Invalid input(s): CK ------------------------------------------------------------------------------------------------------------------    Component Value Date/Time   BNP 3,168.7 (H) 10/04/2017 1553    Inpatient Medications  Scheduled Meds: . aspirin  81 mg Per Tube Daily  . atorvastatin  20 mg Per Tube Daily  . carvedilol  6.25 mg Per Tube BID WC  . chlorhexidine gluconate (MEDLINE KIT)  15 mL Mouth Rinse BID  . clopidogrel  75 mg Per Tube Daily  . famotidine  20 mg Per Tube Daily  . feeding supplement (PRO-STAT SUGAR FREE 64)  30 mL Per Tube BID  .  fentaNYL  50 mcg Transdermal Q72H  . folic acid  1 mg Per Tube Daily  . free water  200 mL Per Tube Q3H  . heparin  5,000 Units Subcutaneous Q8H  . insulin aspart  0-9 Units Subcutaneous Q4H  . insulin glargine  8 Units Subcutaneous QHS  . mouth rinse  15 mL Mouth Rinse 10 times per day  . multivitamin with minerals  1 tablet Per Tube Daily  . thiamine  100 mg Per Tube Daily   Continuous Infusions: . sodium chloride Stopped (10/16/17 2310)  . feeding supplement (VITAL AF 1.2 CAL) 25 mL/hr at 10/20/17 0600   PRN Meds:.atropine, docusate, fentaNYL (SUBLIMAZE) injection, LORazepam, ondansetron, promethazine  Micro Results No results found for this or any previous visit (from the past 240 hour(s)).  Radiology Reports Ct Head Wo Contrast  Result Date: 10/03/2017 CLINICAL DATA:  Altered level of consciousness. Intubated patient, obtunded with no sedation. EXAM: CT HEAD WITHOUT CONTRAST TECHNIQUE: Contiguous axial images were obtained from the base of the skull through the vertex without intravenous contrast. COMPARISON:  09/21/2015 FINDINGS: Brain: Stable atrophy and chronic small vessel ischemia. No intracranial hemorrhage, mass effect, or midline shift. No evidence of cerebral edema. No hydrocephalus. The basilar cisterns are patent. No evidence of territorial infarct or acute ischemia. No extra-axial or intracranial fluid collection. Vascular: Atherosclerosis of skullbase vasculature without hyperdense vessel or abnormal calcification. Skull: No fracture or focal lesion. Sinuses/Orbits: No acute finding.  Prior cataract resection. Other: None. IMPRESSION: 1.  No acute intracranial abnormality. 2. Atrophy and chronic small vessel ischemia. Electronically Signed   By: Melanie  Ehinger M.D.   On: 10/02/2017 20:34   Dg Chest Port 1 View  Result Date: 10/16/2017 CLINICAL DATA:  Dyspnea. EXAM: PORTABLE CHEST 1 VIEW COMPARISON:  Radiograph October 11, 2017. FINDINGS: Stable cardiomediastinal silhouette.  Status post coronary artery bypass graft. Tracheostomy tube is in good position. Feeding tube is seen entering the stomach. Atherosclerosis of thoracic aorta is noted. No pneumothorax is noted. Stable bibasilar opacities are noted consistent with atelectasis and associated pleural effusions. Bony thorax is unremarkable. IMPRESSION: Stable bibasilar opacities as described above. Electronically Signed   By:   Green Jr, M.D.   On: 10/16/2017 07:11   Dg Chest Port 1 View  Result Date: 10/11/2017 CLINICAL DATA:  Respiratory distress. EXAM: PORTABLE CHEST 1 VIEW COMPARISON:  10/08/2017 and older exams FINDINGS: There is increased lung base opacity when compared to the most recent prior exam, most likely due to an increase in pleural fluid. Remainder of the lungs is clear. No evidence of pulmonary edema. No pneumothorax. Stable changes from prior CABG surgery. Tracheostomy tube and enteric feeding tube are stable and well positioned. IMPRESSION: 1. Interval increase in lung   base opacity consistent with increased pleural effusions. No evidence of pulmonary edema. No other change. Electronically Signed   By: Lajean Manes M.D.   On: 10/11/2017 15:58   Dg Chest Port 1 View  Result Date: 10/08/2017 CLINICAL DATA:  Acute respiratory failure EXAM: PORTABLE CHEST 1 VIEW COMPARISON:  10/07/2017 FINDINGS: Tracheostomy tube, feeding catheter and right jugular central line are again seen and stable. Cardiac shadow is stable. Postsurgical changes are again seen. Small bilateral pleural effusions are noted left greater than right. Mild left basilar atelectasis is noted as well. IMPRESSION: No significant interval change from the prior exam. Small bilateral pleural effusions are again seen. Electronically Signed   By: Inez Catalina M.D.   On: 10/08/2017 08:29   Dg Chest Port 1 View  Result Date: 10/07/2017 CLINICAL DATA:  Hypoxia EXAM: PORTABLE CHEST 1 VIEW COMPARISON:  October 05, 2017 FINDINGS: Tracheostomy catheter tip  is 8.0 cm above the carina. Central catheter tip is in the superior vena cava near the cavoatrial junction. Feeding tube tip is below the diaphragm. No pneumothorax. There are small pleural effusions bilaterally with bibasilar atelectasis. There is cardiomegaly with pulmonary vascularity within normal limits. Patient is status post coronary artery bypass grafting. There is aortic atherosclerosis. No adenopathy. No bone lesions. IMPRESSION: Tube and catheter positions as described without pneumothorax. Cardiomegaly with small pleural effusions bilaterally. There is bibasilar atelectasis. No frank edema or consolidation evident. There is aortic atherosclerosis. Aortic Atherosclerosis (ICD10-I70.0). Electronically Signed   By: Lowella Grip III M.D.   On: 10/07/2017 07:32   Dg Chest Port 1 View  Result Date: 10/05/2017 CLINICAL DATA:  Respiratory failure, shortness of breath. EXAM: PORTABLE CHEST 1 VIEW COMPARISON:  Portable chest x-ray of October 04, 2017 FINDINGS: The lungs are adequately inflated. The tracheostomy tube tip projects between the clavicular heads. Bibasilar atelectasis or pneumonia is present greatest on the left. There small bilateral pleural effusions which appears stable. The heart is top-normal in size. The pulmonary vascularity is mildly engorged. The patient has undergone previous CABG. A feeding tube is present whose tip projects below the inferior margin of the image. The right internal jugular venous catheter tip projects over the midportion of the SVC. IMPRESSION: Fairly stable changes of CHF with mild interstitial edema and small bilateral pleural effusions. There may be bibasilar atelectasis greatest on the left. Electronically Signed   By: David  Martinique M.D.   On: 10/05/2017 07:11   Dg Chest Port 1 View  Result Date: 10/04/2017 CLINICAL DATA:  Respiratory failure EXAM: PORTABLE CHEST 1 VIEW COMPARISON:  1 day prior FINDINGS: Right internal jugular line tip unchanged. Feeding tube  extends beyond the inferior aspect of the film. Tracheostomy appropriately positioned. Midline trachea. Mild cardiomegaly. Atherosclerosis in the transverse aorta. Small layering bilateral pleural effusions. Shrapnel about the lower chest. No pneumothorax. Mild interstitial edema is similar. Bibasilar airspace disease is not significantly changed. IMPRESSION: No significant change since one day prior. Mild congestive heart failure with small bilateral pleural effusions and bibasilar airspace disease-likely atelectasis. Electronically Signed   By: Abigail Miyamoto M.D.   On: 10/04/2017 07:21   Dg Chest Port 1 View  Result Date: 10/03/2017 CLINICAL DATA:  Respiratory failure EXAM: PORTABLE CHEST 1 VIEW COMPARISON:  10/02/2017 FINDINGS: Support devices are stable. Layering bilateral effusions with bilateral perihilar and lower lobe airspace opacities, similar prior study. Mild cardiomegaly. Prior CABG. IMPRESSION: Layering bilateral effusions with bilateral lower lobe atelectasis or infiltrates. Cardiomegaly. No significant change. Electronically Signed   By:  Kevin  Dover M.D.   On: 10/03/2017 07:17   Dg Chest Port 1 View  Result Date: 10/02/2017 CLINICAL DATA:  Respiratory failure. EXAM: PORTABLE CHEST 1 VIEW COMPARISON:  10/01/2017.  09/30/2017. FINDINGS: Tracheostomy tube, feeding tube, right IJ line in stable position. Prior CABG. Cardiomegaly with diffuse bilateral pulmonary infiltrates/edema and bilateral pleural effusions. Findings most consistent with CHF. Vascular calcification noted. IMPRESSION: 1.  Lines and tubes in stable position. 2. Prior CABG. Cardiomegaly with diffuse bilateral pulmonary infiltrates/edema bilateral pleural effusions. Findings most consistent with CHF. Electronically Signed   By: Thomas  Register   On: 10/02/2017 06:22   Dg Chest Port 1 View  Result Date: 10/01/2017 CLINICAL DATA:  Endotracheal tube placement EXAM: PORTABLE CHEST 1 VIEW COMPARISON:  09/30/2017 FINDINGS:  Tracheostomy in good position. Central venous catheter tip in the SVC unchanged. Feeding tube enters the stomach with the tip not visualized. Bibasilar airspace disease left greater than right is unchanged. Small bilateral effusions also unchanged. Negative for edema. IMPRESSION: Bibasilar airspace disease and bilateral pleural effusions left greater than right unchanged from the prior study. Electronically Signed   By: Charles  Clark M.D.   On: 10/01/2017 07:21   Dg Chest Port 1 View  Result Date: 09/30/2017 CLINICAL DATA:  Tracheostomy status.  Pleural effusions. EXAM: PORTABLE CHEST 1 VIEW 3:15 p.m. COMPARISON:  Chest x-ray dated 09/30/2017 at 4:58 a.m. and chest x-ray dated 09/29/2017 FINDINGS: Since the prior study the endotracheal tube has been removed and a tracheostomy tube has been inserted and appears in good position in the AP projection. Central catheter tip is at the cavoatrial junction. Feeding tube tip is below the diaphragm. Bilateral pleural effusions are less prominent than on the prior study of this same date. Chronic cardiomegaly. Aortic atherosclerosis. Pulmonary vascularity appears normal. CABG. IMPRESSION: 1. Tracheostomy tube appears in good position. 2. Decreased bilateral pleural effusions. Haziness in the lung bases has resolved and may have represented mild pulmonary edema. 3.  Aortic Atherosclerosis (ICD10-I70.0). Electronically Signed   By:   Maxwell M.D.   On: 09/30/2017 15:41   Dg Chest Port 1 View  Result Date: 09/30/2017 CLINICAL DATA:  Hypoxia EXAM: PORTABLE CHEST 1 VIEW COMPARISON:  September 29, 2017 FINDINGS: Endotracheal tube tip is 3.5 cm above the carina. Nasogastric tube and feeding tube tips are below the diaphragm. Central catheter tip is in the superior vena cava near the cavoatrial junction. No pneumothorax. There are bilateral pleural effusions with patchy airspace consolidation in both lower lobes. Heart is mildly enlarged with pulmonary vascularity normal.  Patient is status post coronary artery bypass grafting. There is aortic atherosclerosis. No evident bone lesions. IMPRESSION: Tube and catheter positions as described. Small pneumothorax seen previously on the right is no longer evident. There are bilateral pleural effusions with airspace consolidation in both lower lobes. Suspect a degree of pneumonia in the lung bases, although alveolar edema could present in this manner. Both entities may be present concurrently. Stable cardiac prominence. There is aortic atherosclerosis. Aortic Atherosclerosis (ICD10-I70.0). Electronically Signed   By: William  Woodruff III M.D.   On: 09/30/2017 07:06   Dg Chest Port 1 View  Result Date: 09/29/2017 CLINICAL DATA:  Endotracheal tube EXAM: PORTABLE CHEST 1 VIEW COMPARISON:  09/28/2017 FINDINGS: Endotracheal tube in good position. Feeding tube in place with the tip not visualized but entering the stomach. NG tube also enters the stomach. Right jugular central venous catheter tip at the cavoatrial junction unchanged. Probable small right apical pneumothorax. Recommend follow-up for confirmation. Bibasilar   atelectasis and effusion unchanged.  Negative for edema. IMPRESSION: Endotracheal tube remains in good position. Bibasilar atelectasis and effusion unchanged Probable small right apical pneumothorax. Recommend follow-up chest x-ray to confirm. Electronically Signed   By: Franchot Gallo M.D.   On: 09/29/2017 08:47   Dg Chest Port 1 View  Result Date: 09/28/2017 CLINICAL DATA:  Respiratory failure EXAM: PORTABLE CHEST 1 VIEW COMPARISON:  Chest x-rays dated 09/27/2017, 09/26/2017 and 11/16/2016. FINDINGS: Endotracheal tube appears well positioned with tip approximately 3 cm above the carina. Enteric tube passes below the diaphragm. RIGHT IJ central line appears adequately positioned with tip at the level of the mid/lower SVC. Stable cardiomegaly. Stable bibasilar pleural effusions with probable associated atelectasis. No new  lung findings. No pneumothorax seen. IMPRESSION: 1. No change compared to yesterday's chest x-ray. Persistent bibasilar pleural effusions, small to moderate in size, with probable associated atelectasis. 2. Support apparatus appears appropriately positioned. Electronically Signed   By: Franki Cabot M.D.   On: 09/28/2017 08:40   Dg Chest Port 1 View  Result Date: 09/27/2017 CLINICAL DATA:  Respiratory failure EXAM: PORTABLE CHEST 1 VIEW COMPARISON:  Chest radiograph from one day prior. FINDINGS: Endotracheal tube tip is 3.4 cm above the carina. Enteric tubes enter stomach with the tips not seen on this image. Right internal jugular central venous catheter terminates at the cavoatrial junction. Intact sternotomy wires. CABG clips overlie the mediastinum. Stable cardiomediastinal silhouette with mild cardiomegaly. No pneumothorax. Stable small bilateral pleural effusions with bibasilar atelectasis. No pulmonary edema. IMPRESSION: 1. Support structures as detailed.  No pneumothorax. 2. Stable small bilateral pleural effusions with bibasilar atelectasis. No pulmonary edema. Electronically Signed   By: Ilona Sorrel M.D.   On: 09/27/2017 09:33   Dg Chest Port 1 View  Result Date: 09/26/2017 CLINICAL DATA:  Pneumonia, ventilatory support EXAM: PORTABLE CHEST 1 VIEW COMPARISON:  09/25/2017 FINDINGS: Endotracheal tube 5.7 cm above the carina. Right IJ central line tip lower SVC level. NG tube within the stomach, tip not visualized. Previous coronary bypass changes noted. Mild cardiomegaly with similar bibasilar consolidation and pleural effusions. Basilar pneumonia not excluded. No pneumothorax. Aorta atherosclerotic. IMPRESSION: Similar bibasilar consolidation and pleural effusions concerning for pneumonia. Stable support apparatus No pneumothorax Electronically Signed   By: Jerilynn Mages.  Shick M.D.   On: 09/26/2017 08:48   Dg Chest Port 1 View  Result Date: 09/25/2017 CLINICAL DATA:  Follow-up pneumonia. EXAM: PORTABLE  CHEST 1 VIEW COMPARISON:  Chest radiograph performed 09/24/2017 FINDINGS: The patient's endotracheal tube is seen ending 2 cm above the carina. An enteric tube is noted extending below the diaphragm. The right IJ line is noted ending about the distal SVC. Worsening bibasilar and left perihilar airspace opacification raises concern for worsening pneumonia. Underlying small bilateral pleural effusions are noted. No pneumothorax is seen. The cardiomediastinal silhouette is normal in size. The patient is status post median sternotomy, with evidence of prior CABG. No acute osseous abnormalities are identified. IMPRESSION: 1. Endotracheal tube seen ending 2 cm above the carina. 2. Worsening bibasilar and left perihilar airspace opacification raises concern for worsening pneumonia. Underlying small bilateral pleural effusions noted. Electronically Signed   By: Garald Balding M.D.   On: 09/25/2017 03:33   Dg Chest Port 1 View  Result Date: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     

## 2017-10-21 ENCOUNTER — Ambulatory Visit: Payer: Medicare HMO | Admitting: Internal Medicine

## 2017-10-21 DIAGNOSIS — Z930 Tracheostomy status: Secondary | ICD-10-CM

## 2017-10-21 LAB — CBC
HCT: 32.2 % — ABNORMAL LOW (ref 39.0–52.0)
Hemoglobin: 9.9 g/dL — ABNORMAL LOW (ref 13.0–17.0)
MCH: 30.2 pg (ref 26.0–34.0)
MCHC: 30.7 g/dL (ref 30.0–36.0)
MCV: 98.2 fL (ref 78.0–100.0)
PLATELETS: 179 10*3/uL (ref 150–400)
RBC: 3.28 MIL/uL — ABNORMAL LOW (ref 4.22–5.81)
RDW: 18.6 % — AB (ref 11.5–15.5)
WBC: 8.5 10*3/uL (ref 4.0–10.5)

## 2017-10-21 LAB — GLUCOSE, CAPILLARY
GLUCOSE-CAPILLARY: 115 mg/dL — AB (ref 70–99)
GLUCOSE-CAPILLARY: 126 mg/dL — AB (ref 70–99)
GLUCOSE-CAPILLARY: 161 mg/dL — AB (ref 70–99)
GLUCOSE-CAPILLARY: 165 mg/dL — AB (ref 70–99)
Glucose-Capillary: 139 mg/dL — ABNORMAL HIGH (ref 70–99)
Glucose-Capillary: 136 mg/dL — ABNORMAL HIGH (ref 70–99)

## 2017-10-21 LAB — COMPREHENSIVE METABOLIC PANEL
ALBUMIN: 2 g/dL — AB (ref 3.5–5.0)
ALT: 25 U/L (ref 0–44)
AST: 36 U/L (ref 15–41)
Alkaline Phosphatase: 197 U/L — ABNORMAL HIGH (ref 38–126)
Anion gap: 8 (ref 5–15)
BUN: 125 mg/dL — AB (ref 8–23)
CHLORIDE: 118 mmol/L — AB (ref 98–111)
CO2: 19 mmol/L — ABNORMAL LOW (ref 22–32)
CREATININE: 1.9 mg/dL — AB (ref 0.61–1.24)
Calcium: 8.3 mg/dL — ABNORMAL LOW (ref 8.9–10.3)
GFR calc Af Amer: 37 mL/min — ABNORMAL LOW (ref >60)
GFR calc non Af Amer: 32 mL/min — ABNORMAL LOW (ref >60)
GLUCOSE: 148 mg/dL — AB (ref 70–99)
POTASSIUM: 4.8 mmol/L (ref 3.5–5.1)
Sodium: 145 mmol/L (ref 135–145)
Total Bilirubin: 1 mg/dL (ref 0.3–1.2)
Total Protein: 6.5 g/dL (ref 6.5–8.1)

## 2017-10-21 LAB — PROTIME-INR
INR: 1.31
Prothrombin Time: 16.2 seconds — ABNORMAL HIGH (ref 11.4–15.2)
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
## 2017-10-21 NOTE — Progress Notes (Addendum)
P2P denied from [facility]. Updated CSW who will speak w Palliative to assist in coordinating w family, goals and disposition.  Spoke w Dr Jerral RalphGhimire, updated that P2P unsuccessful. Dr Jerral RalphGhimire mentioned PEG placement in reference to Dr Reuel DerbyKims note of it ensuring approval for LTAC.  [facility] about [facility]. She had not heard of this and could not understand why PEG would guarantee LTAC approval. Questioned if reference was meant for long term care at SNF as they would need feeding route established prior to approval. PEG planned for early next week s/p plavix washout.   Will not pursue expedited appeal, at this time per Dr Jerral RalphGhimire.

## 2017-10-21 NOTE — Progress Notes (Addendum)
3:30pm [facility] unable to extend bed offer for short term rehab due to patient poor prognosis, inability to participate with rehab or wean from vent.  They can offer for patient under private pay which would be about $600/day.    CSW spoke with pt [facility] [place] and updated on barriers to local placement and that most realistic [place] available [place]- she thinks patient would definitely die being that far away from family  dtr feeling frustrated by insurance not paying for things- CSW explained that insurance has rehab benefit but if there would be no perceived improvement from going into rehab that they will not approve a SNF stay- she remained frustrated by expressed understanding  Dtr not sure what to do at this time- feels as if the hospital is just trying to push the patient out or let him die- CSW provided active listening and empathy regarding the complications with working with folks with ventilators.  12pm CSW continuing to follow- awaiting response from [facility] DON regarding if they could accept patient for placement  Burna SisJenna H. Kashmir Lysaght, LCSW Clinical Social Worker [phone]
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
## 2017-10-22 DIAGNOSIS — T8189XA Other complications of procedures, not elsewhere classified, initial encounter: Secondary | ICD-10-CM

## 2017-10-22 DIAGNOSIS — Z9911 Dependence on respirator [ventilator] status: Secondary | ICD-10-CM

## 2017-10-22 LAB — BASIC METABOLIC PANEL
ANION GAP: 12 (ref 5–15)
BUN: 110 mg/dL — ABNORMAL HIGH (ref 8–23)
CALCIUM: 8.4 mg/dL — AB (ref 8.9–10.3)
CO2: 15 mmol/L — AB (ref 22–32)
CREATININE: 1.68 mg/dL — AB (ref 0.61–1.24)
Chloride: 118 mmol/L — ABNORMAL HIGH (ref 98–111)
GFR calc Af Amer: 43 mL/min — ABNORMAL LOW (ref >60)
GFR calc non Af Amer: 37 mL/min — ABNORMAL LOW (ref >60)
GLUCOSE: 158 mg/dL — AB (ref 70–99)
Potassium: 4.6 mmol/L (ref 3.5–5.1)
Sodium: 145 mmol/L (ref 135–145)

## 2017-10-22 LAB — GLUCOSE, CAPILLARY
GLUCOSE-CAPILLARY: 220 mg/dL — AB (ref 70–99)
GLUCOSE-CAPILLARY: 248 mg/dL — AB (ref 70–99)
Glucose-Capillary: 162 mg/dL — ABNORMAL HIGH (ref 70–99)
Glucose-Capillary: 149 mg/dL — ABNORMAL HIGH (ref 70–99)
Glucose-Capillary: 171 mg/dL — ABNORMAL HIGH (ref 70–99)

## 2017-10-22 MED ORDER — FENTANYL 25 MCG/HR TD PT72
75.0000 ug | MEDICATED_PATCH | TRANSDERMAL | Status: DC
Start: 2017-10-25 — End: 2017-10-25

## 2017-10-22 MED ORDER — GLYCOPYRROLATE 0.2 MG/ML IJ SOLN
0.1000 mg | Freq: Three times a day (TID) | INTRAMUSCULAR | Status: DC
Start: 2017-10-22 — End: 2017-10-25
  Administered 2017-10-22 – 2017-10-25 (×10): 0.1 mg via INTRAVENOUS
  Filled 2017-10-22 (×10): qty 1
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
## 2017-10-22 NOTE — Progress Notes (Signed)
At bs w/ Dellinger, PA. Difficult to get reliable response from pt. Pt engages momentarily then appears less responsive. Pt appeared most engaged and focused when ask if he wanted the trach out and ventilator off if it meant he would pass on. He said and nodded "yes". I clarified the this might mean he would not be able to breath and would pass, he mouthed most clearly "that's alright". Pt videoed during this query per POA request to Dellinger and video to be sent by Dellinger to the daughter for her to evaluate and weigh in deciding course of care.

## 2017-10-22 NOTE — Progress Notes (Addendum)
Daily Progress Note   Patient Name: Johnny [name]       Date: 10/22/2017 DOB: [birth year]  Age: 83 y.o. MRN#: [id number] Attending Physician: Jonetta Osgood, MD Primary Care Physician: Marletta Lor, MD Admit Date: 09/27/2017  Reason for Consultation/Follow-up: Establishing goals of care  Subjective: Discussed with Suprena [facility]).  "I've made wrong decisions in my life and I don't want to make a wrong decision"  She asks for me to attempt to video her father stating that he wants the trach out and he wants to be comfortable to pass on.  She wants to be able to show this to her family.  I explained to [place] that today he is not able to tolerate the weaning trial (he immediately develops respiratory distress), he is in significant pain, the wound on his neck looks incredibly painful and I believe he is truly suffering.  I returned later in the day.  His RN, Shirlean Mylar and I attempted to ask him if he wanted to continue with the tracheostomy or if he was ready to pass away.  We did video it as his daughter requested.  His responses seemed mixed but he did seem to clearly answer that he was ready to pass away.  I will provide this video to [place] so that she can interpret it.    I called Suprena on phone to discuss the video.  I explained to her that from a medical perspective our strong recommendation is to make him comfortable and withdraw ventilator support.  He will not improve beyond where he is now and he is currently suffering.  Suprena said, "I understand that we need to let him go".  She asked to be able to gather her family together from out of state.  She is considering withdraw of ventilator support this weekend when she is off of work and her brother from [place] may be able to be  here.  Assessment: Vent dependent, receiving artificial nutrition, severely disabled, completely dependent, multiple non-healing wounds, in pain.  Wound under tracheostomy looks particularly painful.   Patient Profile/HPI:  82 y.o. male  with past medical history of alcoholism, systolic heart failure, atrial fib, history of trach and acute respiratory failure requiring long convalescence (at [facility] in the past for 6 months) admitted on 10/09/2017 with acute  respiratory failure..  Patient has now had a tracheostomy performed but is not weaning well from the ventilator support.  Patient with acute kidney injury on top of chronic kidney disease.   Patient had an ejection fraction of 40% in 2017 but is now at 20 to 25%.  Consult ordered for goals of care.  Length of Stay: 29  Current Medications: Scheduled Meds:  . aspirin  81 mg Per Tube Daily  . atorvastatin  20 mg Per Tube Daily  . carvedilol  6.25 mg Per Tube BID WC  . chlorhexidine gluconate (MEDLINE KIT)  15 mL Mouth Rinse BID  . famotidine  20 mg Per Tube Daily  . fentaNYL  50 mcg Transdermal Q72H  . folic acid  1 mg Per Tube Daily  . free water  200 mL Per Tube Q3H  . glycopyrrolate  0.1 mg Intravenous TID  . heparin  5,000 Units Subcutaneous Q8H  . insulin aspart  0-9 Units Subcutaneous Q4H  . insulin glargine  8 Units Subcutaneous QHS  . mouth rinse  15 mL Mouth Rinse 10 times per day  . multivitamin with minerals  1 tablet Per Tube Daily  . thiamine  100 mg Per Tube Daily    Continuous Infusions: . sodium chloride Stopped (10/16/17 2310)  . feeding supplement (VITAL AF 1.2 CAL) 65 mL/hr at 10/22/17 1000    PRN Meds: atropine, docusate, fentaNYL (SUBLIMAZE) injection, LORazepam, ondansetron, promethazine  Physical Exam        Chronically ill appearing male, struggling as his trach is suctioned.  Moving in pain CV irreg with murmur When I speak with him he will look at me but appears too sleepy to answer.    Vital Signs: BP (!) 104/28   Pulse 87   Temp 98 F (36.7 C) (Oral)   Resp 20   Ht 5' 10.5" (1.791 m)   Wt 83.3 kg (183 lb 10.3 oz)   SpO2 100%   BMI 25.98 kg/m  SpO2: SpO2: 100 % O2 Device: O2 Device: Ventilator O2 Flow Rate: O2 Flow Rate (L/min): 10 L/min  Intake/output summary:   Intake/Output Summary (Last 24 hours) at 10/22/2017 1159 Last data filed at 10/22/2017 1000 Gross per 24 hour  Intake 1676.33 ml  Output 940 ml  Net 736.33 ml   LBM: Last BM Date: 10/21/17 Baseline Weight: Weight: 81.6 kg (180 lb) Most recent weight: Weight: 83.3 kg (183 lb 10.3 oz)       Palliative Assessment/Data: 20%   Flowsheet Rows     Most Recent Value  Intake Tab  Referral Department  Critical care  Unit at Time of Referral  ICU  Palliative Care Primary Diagnosis  Pulmonary  Date Notified  10/08/17  Palliative Care Type  New Palliative care  Reason for referral  Clarify Goals of Care, Psychosocial or Spiritual support  Date of Admission  09/21/2017  Date first seen by Palliative Care  10/09/17  # of days Palliative referral response time  1 Day(s)  # of days IP prior to Palliative referral  15  Clinical Assessment  Palliative Performance Scale Score  20%  Pain Max last 24 hours  Not able to report  Pain Min Last 24 hours  Not able to report  Dyspnea Max Last 24 Hours  Not able to report  Dyspnea Min Last 24 hours  Not able to report  Nausea Max Last 24 Hours  Not able to report  Nausea Min Last 24 Hours  Not able to  report  Anxiety Max Last 24 Hours  Not able to report  Anxiety Min Last 24 Hours  Not able to report  Other Max Last 24 Hours  Not able to report  Psychosocial & Spiritual Assessment  Palliative Care Outcomes  Patient/Family meeting held?  Yes  Who was at the meeting?  2 daughters  Palliative Care follow-up planned  Yes, Facility      Patient Active Problem List   Diagnosis Date Noted  . Acute respiratory failure with hypoxemia ([place])   . DNR (do not  resuscitate)   . Palliative care by specialist   . Generalized pain   . Pressure injury of skin 10/08/2017  . Acute on chronic combined systolic and diastolic CHF (congestive heart failure) ([place])   . Demand ischemia ([place])   . Sepsis ([place]) 09/25/2017  . Septic shock ([place])   . AKI (acute kidney injury) ([place])   . Elevated troponin   . Chest pain 11/16/2016  . CAP (community acquired pneumonia) 09/22/2015  . Pressure ulcer 09/22/2015  . Goals of care, counseling/discussion   . Alcoholic cardiomyopathy ([place]) 05/02/2015  . Debility   . COPD (chronic obstructive pulmonary disease), presumed 04/26/2012  . Protein calorie malnutrition ([place]) 04/26/2012  . Poor dentition 04/26/2012  . Atrial flutter ([place]) 04/13/2012  . Protein-calorie malnutrition, severe ([place]) 04/11/2012  . Acute hypoxemic respiratory failure ([place]) 04/02/2012  . s/p PEA Cardiac arrest: Likely related to pneumonia, acidosis, hypoxic respiratory failure in setting of ETOH withdrawal 04/02/2012  . Anoxic Encephalopathy: due to hypoxia after cardiac arrest.  04/02/2012  . HCAP (healthcare-associated pneumonia) -->resolved 04/01/2012  . Alcohol dependence s/p withdrawl  04/01/2012  . acute renal failure (resolved): Baseline creatinine 0.95 from 01/05/12.  04/01/2012  . SIRS (systemic inflammatory response syndrome) ([place]) 12/30/2011  . Hypomagnesemia 12/30/2011  . Weakness generalized 12/29/2011  . Hypertensive urgency 12/29/2011  . Tachycardia 04/28/2011  . Lower extremity weakness 04/28/2011  . Anemia of chronic disease 08/16/2007  . Paroxysmal atrial fibrillation ([place]) 08/12/2007  . Diabetes mellitus with peripheral vascular disease ([place]) 12/11/2006  . Essential hypertension 12/11/2006  . Coronary atherosclerosis 12/11/2006    Palliative Care Plan    Recommendations/Plan:  PMT will continue to work with the family.  Suprena can not emotionally make this decision.  There needs to be a strong medical recommendation for  comfort and withdrawal of ventilator support to take the burden off of her shoulders.   After our second conversation - Suprena is considering withdrawal of ventilator support this weekend.  He appears to be truly suffering with painful wounds.  I will increase his fentanyl transdermal to 75 mcg.  I will not be here tomorrow but will ask one of my team mates to continue working with this family.    Goals of Care and Additional Recommendations:  Limitations on Scope of Treatment: Full Comfort Care  Code Status:  DNR  Prognosis:  Weeks with continued care.  Minutes to hours with comfort and a withdraw of ventilator support as he quickly develops respiratory distress during spontaneous breathing trials.  Discharge Planning:  To Be Determined likely hospital death.  Care plan was discussed with [facility] attending, bedside RN and daughter, Satira Sark  Thank you for allowing the Palliative Medicine Team to assist in the care of this patient.  Total time spent:  35 min     Greater than 50%  of this time was spent counseling and coordinating care related to the above assessment and plan.  Florentina Jenny, PA-C  Palliative Medicine  Please contact Palliative MedicineTeam phone at [phone] for questions and concerns between 7 am - 7 pm.   Please see AMION for individual provider pager numbers.

## 2017-10-23 ENCOUNTER — Inpatient Hospital Stay (HOSPITAL_COMMUNITY): Payer: Medicare HMO

## 2017-10-23 LAB — CBC
HCT: 29.8 % — ABNORMAL LOW (ref 39.0–52.0)
HEMOGLOBIN: 8.7 g/dL — AB (ref 13.0–17.0)
MCH: 29.6 pg (ref 26.0–34.0)
MCHC: 29.2 g/dL — AB (ref 30.0–36.0)
MCV: 101.4 fL — ABNORMAL HIGH (ref 78.0–100.0)
Platelets: 175 10*3/uL (ref 150–400)
RBC: 2.94 MIL/uL — ABNORMAL LOW (ref 4.22–5.81)
RDW: 18.4 % — ABNORMAL HIGH (ref 11.5–15.5)
WBC: 8.5 10*3/uL (ref 4.0–10.5)

## 2017-10-23 LAB — BASIC METABOLIC PANEL
ANION GAP: 10 (ref 5–15)
BUN: 103 mg/dL — AB (ref 8–23)
CHLORIDE: 115 mmol/L — AB (ref 98–111)
CO2: 17 mmol/L — AB (ref 22–32)
Calcium: 8.1 mg/dL — ABNORMAL LOW (ref 8.9–10.3)
Creatinine, Ser: 1.61 mg/dL — ABNORMAL HIGH (ref 0.61–1.24)
GFR calc Af Amer: 45 mL/min — ABNORMAL LOW (ref >60)
GFR calc non Af Amer: 39 mL/min — ABNORMAL LOW (ref >60)
GLUCOSE: 166 mg/dL — AB (ref 70–99)
POTASSIUM: 4.4 mmol/L (ref 3.5–5.1)
Sodium: 142 mmol/L (ref 135–145)

## 2017-10-23 LAB — GLUCOSE, CAPILLARY
GLUCOSE-CAPILLARY: 184 mg/dL — AB (ref 70–99)
GLUCOSE-CAPILLARY: 199 mg/dL — AB (ref 70–99)
GLUCOSE-CAPILLARY: 166 mg/dL — AB (ref 70–99)
Glucose-Capillary: 143 mg/dL — ABNORMAL HIGH (ref 70–99)
Glucose-Capillary: 152 mg/dL — ABNORMAL HIGH (ref 70–99)
Glucose-Capillary: 172 mg/dL — ABNORMAL HIGH (ref 70–99)

## 2017-10-23 NOTE — Progress Notes (Signed)
IR aware of potential family meeting over the weekend re: patient wishes. Will follow-up with family after weekend meeting for consent for gastrostomy which has not yet been obtained.   Loyce DysKacie Khyla Mccumbers, MS RD PA-C 1:43 PM
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
## 2017-10-24 ENCOUNTER — Inpatient Hospital Stay (HOSPITAL_COMMUNITY): Payer: Medicare HMO

## 2017-10-24 LAB — GLUCOSE, CAPILLARY
GLUCOSE-CAPILLARY: 97 mg/dL (ref 70–99)
GLUCOSE-CAPILLARY: 137 mg/dL — AB (ref 70–99)
GLUCOSE-CAPILLARY: 137 mg/dL — AB (ref 70–99)
GLUCOSE-CAPILLARY: 182 mg/dL — AB (ref 70–99)
Glucose-Capillary: 126 mg/dL — ABNORMAL HIGH (ref 70–99)
Glucose-Capillary: 197 mg/dL — ABNORMAL HIGH (ref 70–99)
Glucose-Capillary: 225 mg/dL — ABNORMAL HIGH (ref 70–99)

## 2017-10-24 LAB — BASIC METABOLIC PANEL
ANION GAP: 13 (ref 5–15)
BUN: 99 mg/dL — ABNORMAL HIGH (ref 8–23)
CO2: 16 mmol/L — AB (ref 22–32)
Calcium: 8.3 mg/dL — ABNORMAL LOW (ref 8.9–10.3)
Chloride: 113 mmol/L — ABNORMAL HIGH (ref 98–111)
Creatinine, Ser: 1.55 mg/dL — ABNORMAL HIGH (ref 0.61–1.24)
GFR calc Af Amer: 47 mL/min — ABNORMAL LOW (ref >60)
GFR, EST NON AFRICAN AMERICAN: 41 mL/min — AB (ref >60)
GLUCOSE: 225 mg/dL — AB (ref 70–99)
POTASSIUM: 4.6 mmol/L (ref 3.5–5.1)
Sodium: 142 mmol/L (ref 135–145)

## 2017-10-24 MED ORDER — INSULIN GLARGINE 100 UNIT/ML ~~LOC~~ SOLN: 8.0000 [IU] | Freq: Every day | SUBCUTANEOUS | Status: DC

## 2017-10-24 MED ORDER — BISACODYL 10 MG RE SUPP
10.0000 mg | Freq: Every day | RECTAL | Status: DC
  Administered 2017-10-24: 10 mg via RECTAL
  Filled 2017-10-24: qty 1

## 2017-10-24 NOTE — Progress Notes (Signed)
Patient had an episode of vomiting while on Tubefeed which was stopped immediatly. PRN Phenergan was administered.

## 2017-10-24 NOTE — Progress Notes (Addendum)
Daily Progress Note   Patient Name: Johnny [name]       Date: 10/24/2017 DOB: [birth year]  Age: 83 y.o. MRN#: [id number] Attending Physician: Thurnell Lose, MD Primary Care Physician: Marletta Lor, MD Admit Date: 09/21/2017  Reason for Consultation/Follow-up: Establishing goals of care, Non pain symptom management, Pain control, Psychosocial/spiritual support, Terminal Care and Withdrawal of life-sustaining treatment  Subjective: Patient seen, chart reviewed.  Noted that palliative medicine provider last week did tape patient verbalizing that he wished to pursue comfort care.  His neck wound appears quite painful and his fentanyl transdermal was increased to 75 mcg.  He continues to have trouble with absorption of tube feedings and vomited last night; tube feedings currently on hold    Length of Stay: 31  Current Medications: Scheduled Meds:  . aspirin  81 mg Per Tube Daily  . atorvastatin  20 mg Per Tube Daily  . bisacodyl  10 mg Rectal Daily  . carvedilol  6.25 mg Per Tube BID WC  . chlorhexidine gluconate (MEDLINE KIT)  15 mL Mouth Rinse BID  . famotidine  20 mg Per Tube Daily  . [START ON 10/25/2017] fentaNYL  75 mcg Transdermal Q72H  . folic acid  1 mg Per Tube Daily  . free water  200 mL Per Tube Q3H  . glycopyrrolate  0.1 mg Intravenous TID  . heparin  5,000 Units Subcutaneous Q8H  . insulin aspart  0-9 Units Subcutaneous Q4H  . [START ON 10/25/2017] insulin glargine  8 Units Subcutaneous QHS  . mouth rinse  15 mL Mouth Rinse 10 times per day  . multivitamin with minerals  1 tablet Per Tube Daily  . thiamine  100 mg Per Tube Daily    Continuous Infusions: . sodium chloride 10 mL/hr at 10/24/17 1200  . feeding supplement (VITAL AF 1.2 CAL) 1,000 mL (10/24/17 1205)     PRN Meds: atropine, docusate, fentaNYL (SUBLIMAZE) injection, LORazepam, ondansetron, promethazine  Physical Exam  Constitutional:  Acutely ill-appearing elderly man Eyes to voice otherwise minimally interactive  HENT:  Head: Normocephalic and atraumatic.  Cardiovascular: Normal rate.  Pulmonary/Chest:  Trach with full ventilator support Copious secretions  Abdominal:  Patient vomited last night  Genitourinary:  Genitourinary Comments: Foley catheter  Musculoskeletal:  Patient remains in restraints to protect lines, ventilator; otherwise too  weak to turn or assist in any sort of personal care  Neurological:  Minimally interactive  Skin: Skin is warm and dry.  Wound around tracheostomy Sacral wound  Psychiatric:  Appears uncomfortable, grimacing but otherwise unable to test  Nursing note and vitals reviewed.           Vital Signs: BP (!) 88/68 (BP Location: Right Wrist)   Pulse 84   Temp (!) 97.5 F (36.4 C) (Oral)   Resp (!) 24   Ht 5' 10.5" (1.791 m)   Wt 84.5 kg (186 lb 4.6 oz)   SpO2 100%   BMI 26.35 kg/m  SpO2: SpO2: 100 % O2 Device: O2 Device: Ventilator O2 Flow Rate: O2 Flow Rate (L/min): 40 L/min  Intake/output summary:   Intake/Output Summary (Last 24 hours) at 10/24/2017 1315 Last data filed at 10/24/2017 1200 Gross per 24 hour  Intake 2055.67 ml  Output 920 ml  Net 1135.67 ml   LBM: Last BM Date: 10/24/17 Baseline Weight: Weight: 81.6 kg (180 lb) Most recent weight: Weight: 84.5 kg (186 lb 4.6 oz)       Palliative Assessment/Data:    Flowsheet Rows     Most Recent Value  Intake Tab  Referral Department  Critical care  Unit at Time of Referral  ICU  Palliative Care Primary Diagnosis  Pulmonary  Date Notified  10/08/17  Palliative Care Type  New Palliative care  Reason for referral  Clarify Goals of Care, Psychosocial or Spiritual support  Date of Admission  10/16/2017  Date first seen by Palliative Care  10/09/17  # of days Palliative  referral response time  1 Day(s)  # of days IP prior to Palliative referral  15  Clinical Assessment  Palliative Performance Scale Score  20%  Pain Max last 24 hours  Not able to report  Pain Min Last 24 hours  Not able to report  Dyspnea Max Last 24 Hours  Not able to report  Dyspnea Min Last 24 hours  Not able to report  Nausea Max Last 24 Hours  Not able to report  Nausea Min Last 24 Hours  Not able to report  Anxiety Max Last 24 Hours  Not able to report  Anxiety Min Last 24 Hours  Not able to report  Other Max Last 24 Hours  Not able to report  Psychosocial & Spiritual Assessment  Palliative Care Outcomes  Patient/Family meeting held?  Yes  Who was at the meeting?  2 daughters  Palliative Care follow-up planned  Yes, Facility      Patient Active Problem List   Diagnosis Date Noted  . Ventilator dependent ([place])   . Surgical wound, non healing   . Acute respiratory failure with hypoxemia ([place])   . DNR (do not resuscitate)   . Palliative care by specialist   . Generalized pain   . Pressure injury of skin 10/08/2017  . Acute on chronic combined systolic and diastolic CHF (congestive heart failure) ([place])   . Demand ischemia ([place])   . Sepsis ([place]) 10/07/2017  . Septic shock ([place])   . AKI (acute kidney injury) ([place])   . Elevated troponin   . Chest pain 11/16/2016  . CAP (community acquired pneumonia) 09/22/2015  . Pressure ulcer 09/22/2015  . Goals of care, counseling/discussion   . Alcoholic cardiomyopathy ([place]) 05/02/2015  . Debility   . COPD (chronic obstructive pulmonary disease), presumed 04/26/2012  . Protein calorie malnutrition ([place]) 04/26/2012  . Poor dentition 04/26/2012  . Atrial  flutter ([place]) 04/13/2012  . Protein-calorie malnutrition, severe ([place]) 04/11/2012  . Acute hypoxemic respiratory failure ([place]) 04/02/2012  . s/p PEA Cardiac arrest: Likely related to pneumonia, acidosis, hypoxic respiratory failure in setting of ETOH withdrawal 04/02/2012  . Anoxic  Encephalopathy: due to hypoxia after cardiac arrest.  04/02/2012  . HCAP (healthcare-associated pneumonia) -->resolved 04/01/2012  . Alcohol dependence s/p withdrawl  04/01/2012  . acute renal failure (resolved): Baseline creatinine 0.95 from 01/05/12.  04/01/2012  . SIRS (systemic inflammatory response syndrome) ([place]) 12/30/2011  . Hypomagnesemia 12/30/2011  . Weakness generalized 12/29/2011  . Hypertensive urgency 12/29/2011  . Tachycardia 04/28/2011  . Lower extremity weakness 04/28/2011  . Anemia of chronic disease 08/16/2007  . Paroxysmal atrial fibrillation ([place]) 08/12/2007  . Diabetes mellitus with peripheral vascular disease ([place]) 12/11/2006  . Essential hypertension 12/11/2006  . Coronary atherosclerosis 12/11/2006    Palliative Care Assessment & Plan   Patient Profile: 84 y.o.malewith past medical history of alcoholism, systolic heart failure, atrial fib, history of trach and acute respiratory failure requiring long convalescence (at [facility] in the past for 6 months)admitted on06/13/2019with acute respiratory failure..Patient has now had a tracheostomy performed but is not weaning well from the ventilator support. Patient also now showing acute kidney injury on top of chronic kidney disease. Today's creatinine 2.95. Patient had an ejection fraction of 40% in 2017 but is now at 20 to 25%.  Consult ordered for goals of care  Patient has remained ventilator dependent. Per staff, patient has indicated at times that he is tired and would like to stop aggressive measures. He has been attempting to pull out tubes and lines and is now restrained.Family have set care limits of DNR   Assessment: Spoke to patient's daughter, Satira Sark , who is patient's healthcare power of attorney.Satira Sark states  "I spoke to him last night and he told me he is ready"  Recommendations/Plan:  DNR.  Family to pursue comfort care once everyone has had a chance to come in and visit  patient that would like to before withdrawal of ventilator. Satira Sark is coordinating this today  No PEG tube  Palliative medicine to stay in touch with [facility] and coordinate one-way withdrawal of ventilator, medication management  Education provided to daughter on how we do this to ensure comfort; the usage of medications such as fentanyl, morphine as well as anti-anxiolytics  Goals of Care and Additional Recommendations:  Limitations on Scope of Treatment: Anticipate comfort care and withdrawal of ventilator support over the weekend once family members in to visit  Code Status:    Code Status Orders  (From admission, onward)        Start     Ordered   10/14/17 0947  Do not attempt resuscitation (DNR)  Continuous    Question Answer Comment  In the event of cardiac or respiratory ARREST Do not call a "code blue"   In the event of cardiac or respiratory ARREST Do not perform Intubation, CPR, defibrillation or ACLS   In the event of cardiac or respiratory ARREST Use medication by any route, position, wound care, and other measures to relive pain and suffering. May use oxygen, suction and manual treatment of airway obstruction as needed for comfort.      10/14/17 0948    Code Status History    Date Active Date Inactive Code Status Order ID Comments User Context   10/01/2017 1637 10/14/2017 0948 Full Code [id number]  Marijean Heath, NP ED   11/16/2016 1240 11/18/2016  1951 Full Code [id number]  Elwin Mocha, MD ED   09/22/2015 [phone] 09/26/2015 1748 Full Code [id number]  Edwin Dada, MD Inpatient   05/01/2015 0703 05/08/2015 1942 Full Code [id number]  Toy Baker, MD ED   12/29/2011 0249 01/05/2012 2040 Full Code [id number]  Theressa Millard, MD ED   04/29/2011 0023 05/03/2011 2158 Full Code [id number]  [place], Gwenith Daily, RN Inpatient       Prognosis:   Hours - Days once ventilator support removed  Discharge Planning:  Anticipated Hospital Death  Care plan was  discussed with Dr. Candiss Norse  Thank you for allowing the Palliative Medicine Team to assist in the care of this patient.   Time In: 0800 Time Out: 0835 Total Time 35 min Prolonged Time Billed  no       Greater than 50%  of this time was spent counseling and coordinating care related to the above assessment and plan.  Dory Horn, NP  Please contact Palliative Medicine Team phone at [phone] for questions and concerns.
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
## 2017-10-25 DIAGNOSIS — R0602 Shortness of breath: Secondary | ICD-10-CM

## 2017-10-25 LAB — GLUCOSE, CAPILLARY
GLUCOSE-CAPILLARY: 206 mg/dL — AB (ref 70–99)
Glucose-Capillary: 184 mg/dL — ABNORMAL HIGH (ref 70–99)

## 2017-10-25 MED ORDER — HALOPERIDOL 0.5 MG PO TABS
0.5000 mg | ORAL_TABLET | ORAL | Status: DC | PRN
  Filled 2017-10-25: qty 1

## 2017-10-25 MED ORDER — MORPHINE 100MG IN NS 100ML (1MG/ML) PREMIX INFUSION
5.0000 mg/h | INTRAVENOUS | Status: DC
Start: 2017-10-25 — End: 2017-10-26
  Administered 2017-10-25: 5 mg/h via INTRAVENOUS
  Filled 2017-10-25 (×2): qty 100

## 2017-10-25 MED ORDER — ACETAMINOPHEN 325 MG PO TABS: 650.0000 mg | ORAL_TABLET | Freq: Four times a day (QID) | ORAL | Status: DC | PRN

## 2017-10-25 MED ORDER — HALOPERIDOL LACTATE 5 MG/ML IJ SOLN: 0.5000 mg | INTRAMUSCULAR | Status: DC | PRN

## 2017-10-25 MED ORDER — POLYVINYL ALCOHOL 1.4 % OP SOLN
1.0000 [drp] | Freq: Four times a day (QID) | OPHTHALMIC | Status: DC | PRN
  Filled 2017-10-25: qty 15

## 2017-10-25 MED ORDER — ONDANSETRON HCL 4 MG/2ML IJ SOLN: 4.0000 mg | Freq: Four times a day (QID) | INTRAMUSCULAR | Status: DC | PRN

## 2017-10-25 MED ORDER — MORPHINE BOLUS VIA INFUSION
5.0000 mg | INTRAVENOUS | Status: DC | PRN
  Administered 2017-10-25: 5 mg via INTRAVENOUS
  Filled 2017-10-25: qty 10

## 2017-10-25 MED ORDER — SODIUM CHLORIDE 0.9 % IV SOLN
INTRAVENOUS | Status: DC
  Administered 2017-10-25: 22:00:00 via INTRAVENOUS

## 2017-10-25 MED ORDER — MIDAZOLAM BOLUS VIA INFUSION
2.0000 mg | INTRAVENOUS | Status: DC | PRN
  Filled 2017-10-25: qty 10

## 2017-10-25 MED ORDER — SODIUM CHLORIDE 0.9 % IV SOLN
10.0000 mg/h | INTRAVENOUS | Status: DC
  Administered 2017-10-25 (×2): 17 mg/h via INTRAVENOUS
  Administered 2017-10-25: 2 mg/h via INTRAVENOUS
  Filled 2017-10-25 (×4): qty 10

## 2017-10-25 MED ORDER — BIOTENE DRY MOUTH MT LIQD: 15.0000 mL | OROMUCOSAL | Status: DC | PRN

## 2017-10-25 MED ORDER — GLYCOPYRROLATE 1 MG PO TABS: 1.0000 mg | ORAL_TABLET | ORAL | Status: DC | PRN

## 2017-10-25 MED ORDER — BISACODYL 10 MG RE SUPP
10.0000 mg | Freq: Every day | RECTAL | Status: DC | PRN
Start: 2017-10-25 — End: 2017-10-26

## 2017-10-25 MED ORDER — ACETAMINOPHEN 650 MG RE SUPP: 650.0000 mg | Freq: Four times a day (QID) | RECTAL | Status: DC | PRN

## 2017-10-25 MED ORDER — GLYCOPYRROLATE 0.2 MG/ML IJ SOLN: 0.2000 mg | INTRAMUSCULAR | Status: DC | PRN

## 2017-10-25 MED ORDER — SODIUM CHLORIDE 0.9 % IV SOLN
10.0000 mg/h | INTRAVENOUS | Status: DC
  Administered 2017-10-25: 5 mg/h via INTRAVENOUS
  Filled 2017-10-25: qty 10

## 2017-10-25 MED ORDER — GLYCOPYRROLATE 0.2 MG/ML IJ SOLN
0.4000 mg | Freq: Four times a day (QID) | INTRAMUSCULAR | Status: DC
  Administered 2017-10-25: 0.4 mg via INTRAVENOUS
  Filled 2017-10-25: qty 2

## 2017-10-25 MED ORDER — HALOPERIDOL LACTATE 2 MG/ML PO CONC
0.5000 mg | ORAL | Status: DC | PRN
  Filled 2017-10-25: qty 0.3

## 2017-10-25 MED ORDER — ONDANSETRON 4 MG PO TBDP: 4.0000 mg | ORAL_TABLET | Freq: Four times a day (QID) | ORAL | Status: DC | PRN

## 2017-10-25 MED ORDER — MORPHINE 100MG IN NS 100ML (1MG/ML) PREMIX INFUSION: 5.0000 mg/h | INTRAVENOUS | Status: DC

## 2017-10-25 MED ORDER — GLYCOPYRROLATE 0.2 MG/ML IJ SOLN: 0.4000 mg | INTRAMUSCULAR | Status: DC | PRN
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
## 2017-10-25 NOTE — Progress Notes (Signed)
PATIENT DEATH NOTE:  Patient asystole on monitor. Absent breath sounds and heart sounds. Pupils fixed and dilated. Verified by Vincent GrosMichael Yousef, RN and Lang SnowKanisha Phillips, RN. Time of death 2243. TR[facility] notified. Family called.     150mL Morphine and 45mL of versed wasted in sink. Witnessed by Hollie SalkZeinab Hassan, RN.

## 2017-10-25 NOTE — Progress Notes (Signed)
Daily Progress Note   Patient Name: Johnny [name]       Date: 10/25/2017 DOB: 1934-08-17  Age: 83 y.o. MRN#: [id number] Attending Physician: Thurnell Lose, MD Primary Care Physician: Marletta Lor, MD Admit Date: 10/07/2017  Reason for Consultation/Follow-up: Non pain symptom management, Pain control, Psychosocial/spiritual support, Terminal Care and Withdrawal of life-sustaining treatment  Subjective: Pt seen, chart reviewed. Pt has increased pain overnight and psychological distress. Family ready to withdraw ventilator support today at 1230  Length of Stay: 32  Current Medications: Scheduled Meds:  . aspirin  81 mg Per Tube Daily  . atorvastatin  20 mg Per Tube Daily  . bisacodyl  10 mg Rectal Daily  . carvedilol  6.25 mg Per Tube BID WC  . chlorhexidine gluconate (MEDLINE KIT)  15 mL Mouth Rinse BID  . famotidine  20 mg Per Tube Daily  . fentaNYL  75 mcg Transdermal Q72H  . folic acid  1 mg Per Tube Daily  . free water  200 mL Per Tube Q3H  . glycopyrrolate  0.1 mg Intravenous TID  . heparin  5,000 Units Subcutaneous Q8H  . insulin aspart  0-9 Units Subcutaneous Q4H  . mouth rinse  15 mL Mouth Rinse 10 times per day  . multivitamin with minerals  1 tablet Per Tube Daily  . thiamine  100 mg Per Tube Daily    Continuous Infusions: . sodium chloride Stopped (10/25/17 0254)  . feeding supplement (VITAL AF 1.2 CAL) 55 mL/hr at 10/25/17 0800    PRN Meds: atropine, docusate, fentaNYL (SUBLIMAZE) injection, LORazepam, ondansetron, promethazine  Physical Exam  Constitutional:  Acutely ill appearing elderly man. Appears to be in distress Stating help me  HENT:  Head: Normocephalic and atraumatic.  Neck:  Trach with areas of skin breakdown surrounding  Cardiovascular:  Normal rate.  Pulmonary/Chest:  Trach and vent dependant  Genitourinary:  Genitourinary Comments: Foley and rectal tube  Neurological:  Opens his eyes to voice Very weak  Skin: Skin is warm and dry.  Psychiatric:  Appears to be in psychological distress  Nursing note and vitals reviewed.           Vital Signs: BP 132/65   Pulse 77   Temp (!) 97.5 F (36.4 C) (Oral)   Resp 20   Ht 5' 10.5" (  1.791 m)   Wt 85.4 kg (188 lb 4.4 oz)   SpO2 100%   BMI 26.63 kg/m  SpO2: SpO2: 100 % O2 Device: O2 Device: Ventilator O2 Flow Rate: O2 Flow Rate (L/min): 40 L/min  Intake/output summary:   Intake/Output Summary (Last 24 hours) at 10/25/2017 1018 Last data filed at 10/25/2017 0800 Gross per 24 hour  Intake 1976.17 ml  Output 900 ml  Net 1076.17 ml   LBM: Last BM Date: 10/24/17 Baseline Weight: Weight: 81.6 kg (180 lb) Most recent weight: Weight: 85.4 kg (188 lb 4.4 oz)       Palliative Assessment/Data:    Flowsheet Rows     Most Recent Value  Intake Tab  Referral Department  Critical care  Unit at Time of Referral  ICU  Palliative Care Primary Diagnosis  Pulmonary  Date Notified  10/08/17  Palliative Care Type  New Palliative care  Reason for referral  Clarify Goals of Care, Psychosocial or Spiritual support  Date of Admission  09/30/2017  Date first seen by Palliative Care  10/09/17  # of days Palliative referral response time  1 Day(s)  # of days IP prior to Palliative referral  15  Clinical Assessment  Palliative Performance Scale Score  20%  Pain Max last 24 hours  Not able to report  Pain Min Last 24 hours  Not able to report  Dyspnea Max Last 24 Hours  Not able to report  Dyspnea Min Last 24 hours  Not able to report  Nausea Max Last 24 Hours  Not able to report  Nausea Min Last 24 Hours  Not able to report  Anxiety Max Last 24 Hours  Not able to report  Anxiety Min Last 24 Hours  Not able to report  Other Max Last 24 Hours  Not able to report  Psychosocial &  Spiritual Assessment  Palliative Care Outcomes  Patient/Family meeting held?  Yes  Who was at the meeting?  2 daughters  Palliative Care follow-up planned  Yes, Facility      Patient Active Problem List   Diagnosis Date Noted  . Ventilator dependent ([place])   . Surgical wound, non healing   . Acute respiratory failure with hypoxemia ([place])   . DNR (do not resuscitate)   . Palliative care by specialist   . Generalized pain   . Pressure injury of skin 10/08/2017  . Acute on chronic combined systolic and diastolic CHF (congestive heart failure) ([place])   . Demand ischemia ([place])   . Sepsis ([place]) 09/29/2017  . Septic shock ([place])   . AKI (acute kidney injury) ([place])   . Elevated troponin   . Chest pain 11/16/2016  . CAP (community acquired pneumonia) 09/22/2015  . Pressure ulcer 09/22/2015  . Goals of care, counseling/discussion   . Alcoholic cardiomyopathy ([place]) 05/02/2015  . Debility   . COPD (chronic obstructive pulmonary disease), presumed 04/26/2012  . Protein calorie malnutrition ([place]) 04/26/2012  . Poor dentition 04/26/2012  . Atrial flutter ([place]) 04/13/2012  . Protein-calorie malnutrition, severe ([place]) 04/11/2012  . Acute hypoxemic respiratory failure ([place]) 04/02/2012  . s/p PEA Cardiac arrest: Likely related to pneumonia, acidosis, hypoxic respiratory failure in setting of ETOH withdrawal 04/02/2012  . Anoxic Encephalopathy: due to hypoxia after cardiac arrest.  04/02/2012  . HCAP (healthcare-associated pneumonia) -->resolved 04/01/2012  . Alcohol dependence s/p withdrawl  04/01/2012  . acute renal failure (resolved): Baseline creatinine 0.95 from 01/05/12.  04/01/2012  . SIRS (systemic inflammatory response syndrome) ([place]) 12/30/2011  .  Hypomagnesemia 12/30/2011  . Weakness generalized 12/29/2011  . Hypertensive urgency 12/29/2011  . Tachycardia 04/28/2011  . Lower extremity weakness 04/28/2011  . Anemia of chronic disease 08/16/2007  . Paroxysmal atrial fibrillation ([place])  08/12/2007  . Diabetes mellitus with peripheral vascular disease ([place]) 12/11/2006  . Essential hypertension 12/11/2006  . Coronary atherosclerosis 12/11/2006    Palliative Care Assessment & Plan   Patient Profile: 83 y.o.malewith past medical history of alcoholism, systolic heart failure, atrial fib, history of trach and acute respiratory failure requiring long convalescence (at [facility] in the past for 6 months)admitted on06/18/2019with acute respiratory failure..Patient has now had a tracheostomy performed but is not weaning well from the ventilator support. Patient also now showing acute kidney injury on top of chronic kidney disease. Today's creatinine 2.95. Patient had an ejection fraction of 40% in 2017 but is now at 20 to 25%.  Consult ordered for goals of care  Patient has remained ventilator dependent. Per staff, patient has indicated at times that he is tired and would like to stop aggressive measures. He has been attempting to pull out tubes and lines and is now restrained.Family have set care limits of DNR and are now preparing to withdraw ventilator support on 10/25/17   Assessment: . Plan to DC ventilator support. Will start MS04 gtt and versed; transition to humidified 28% trach collar. Daughters Ivin Booty, Suprena, Public affairs consultant and son, Karie Schwalbe, grandson, and SIL present at the bedside. Prepared family for the process of starting medications to ensure comfort ( specifically MS04, versed) DC of TF, DC of ventilator support and transition to trach collar  Recommendations/Plan:  Pain/Dyspnea: Will start MS04 gtt 5-10/hr and 5-10 q30 min PRN  Secretions: Robinul 0.4 q4 ATC and q4 PRN  Anxiety: start versed gtt 2-10/hr and 2-10 q30 min PRN  Spirtual care consult placed  Goals of Care and Additional Recommendations:  Limitations on Scope of Treatment: Full Comfort Care  Code Status:    Code Status Orders  (From admission, onward)        Start     Ordered     10/14/17 0947  Do not attempt resuscitation (DNR)  Continuous    Question Answer Comment  In the event of cardiac or respiratory ARREST Do not call a "code blue"   In the event of cardiac or respiratory ARREST Do not perform Intubation, CPR, defibrillation or ACLS   In the event of cardiac or respiratory ARREST Use medication by any route, position, wound care, and other measures to relive pain and suffering. May use oxygen, suction and manual treatment of airway obstruction as needed for comfort.      10/14/17 0948    Code Status History    Date Active Date Inactive Code Status Order ID Comments User Context   10/05/2017 1637 10/14/2017 0948 Full Code [id number]  Marijean Heath, NP ED   11/16/2016 1240 11/18/2016 1951 Full Code [id number]  Elwin Mocha, MD ED   09/22/2015 0842 09/26/2015 1748 Full Code [id number]  Edwin Dada, MD Inpatient   05/01/2015 0703 05/08/2015 1942 Full Code [id number]  Toy Baker, MD ED   12/29/2011 0249 01/05/2012 2040 Full Code [id number]  Theressa Millard, MD ED   04/29/2011 0023 05/03/2011 2158 Full Code [id number]  [place], Gwenith Daily, RN Inpatient       Prognosis:   Hours - Days  Discharge Planning:  Anticipated Hospital Death  Care plan was discussed with bedside RN, Maudie Mercury. Updated Dr. Candiss Norse as to plan to DC  from vent and transition to comfort care  Thank you for allowing the Palliative Medicine Team to assist in the care of this patient.   Time In: 1215 Time Out: 1400 Total Time 105 min Prolonged Time Billed  yes       Greater than 50%  of this time was spent counseling and coordinating care related to the above assessment and plan.  Dory Horn, NP  Please contact Palliative Medicine Team phone at [phone] for questions and concerns.

## 2017-10-25 NOTE — Progress Notes (Signed)
   10/25/17 1600  Clinical Encounter Type  Visited With Patient;Health care provider  Visit Type Patient actively dying  Referral From Nurse  Consult/Referral To Chaplain   Responded to a [facility] for EOL.  Upon arrival the family was not present.  Nurse stated family had not been gone long.  Went to bedside and said a pray for the patient.  Let the unit secretary know that if family returned and I am available to come back.  Will follow and support as needed. Chaplain Agustin CreeNewton Shadaya Marschner

## 2017-10-25 NOTE — Progress Notes (Signed)
This note also relates to the following rows which could not be included: SpO2 - Cannot attach notes to unvalidated device data  Placed pt on 28% ATC

## 2017-11-12 ENCOUNTER — Other Ambulatory Visit: Payer: Self-pay | Admitting: Internal Medicine

## 2017-11-19 ENCOUNTER — Other Ambulatory Visit: Payer: Self-pay

## 2017-11-19 NOTE — Progress Notes (Signed)
Chaplain was called to the room to be with PT family ([employer] employee is granddaughter).  PT passed away and family member experiencing grief and shock.  The Chaplain was able to talk with the granddaughter about the deceased as a way of reflecting on his life.  Additional family would be on their way to the hospital this evening.
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
## 2017-11-19 NOTE — Telephone Encounter (Signed)
Entered chart in error.

## 2017-11-19 DEATH — deceased
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
## 2019-11-25 IMAGING — DX DG CHEST 1V PORT
1 series · 1 of 1 positions shown · non-contrast
Comparison: Radiograph November 16, 2016.

CLINICAL DATA: Cough, shortness of breath.

EXAM:
PORTABLE CHEST 1 VIEW

[chest]
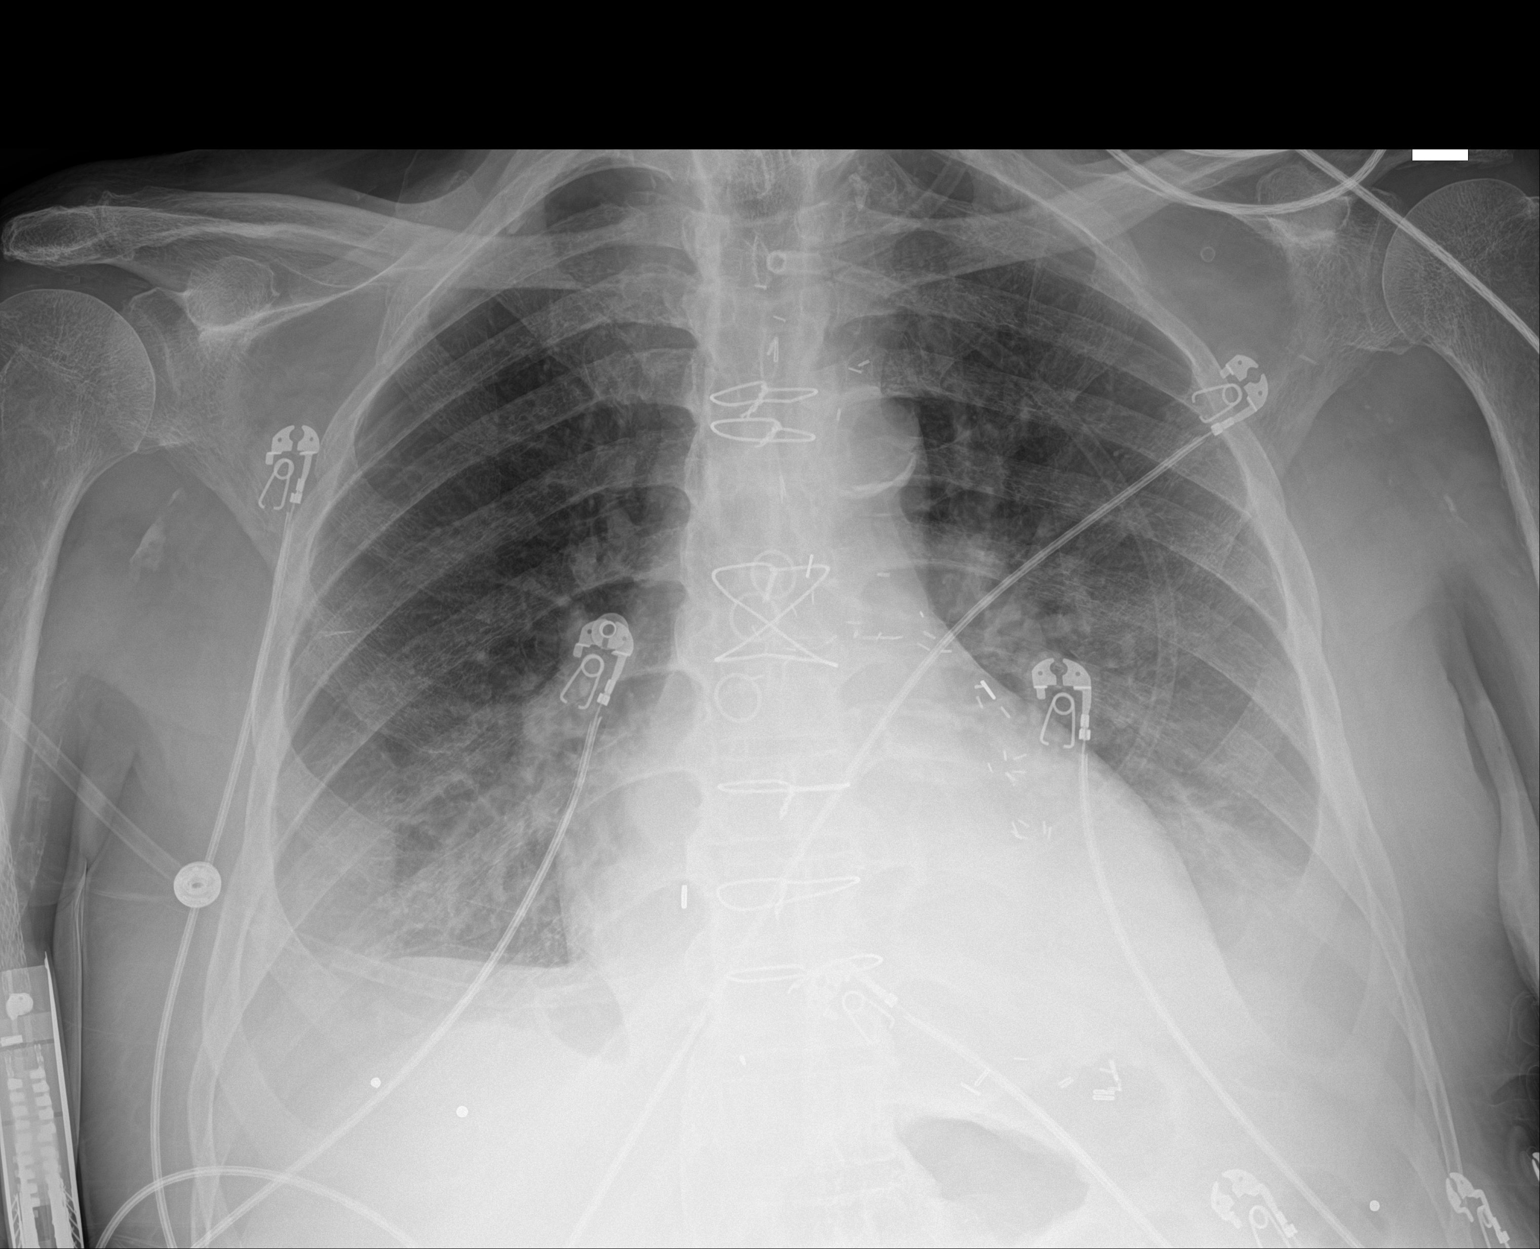

[1 of 1 positions shown; findings below may reference images not displayed]

FINDINGS: Stable cardiomediastinal silhouette. Status post coronary artery
bypass graft. No pneumothorax is noted. Atherosclerosis of thoracic
aorta is noted. Mild bibasilar subsegmental atelectasis or edema is
noted with mild pleural effusions. Bony thorax is unremarkable.
IMPRESSION: Mild bibasilar subsegmental atelectasis or edema with mild pleural
effusions.

Aortic Atherosclerosis ([id number]).
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
## 2019-11-25 IMAGING — DX DG CHEST 1V PORT
2 series · 2 of 2 positions shown · non-contrast
Comparison: Film from earlier in the same day.

CLINICAL DATA: Status post endotracheal tube placement

EXAM:
PORTABLE CHEST 1 VIEW

[chest ap (1 of 2)]
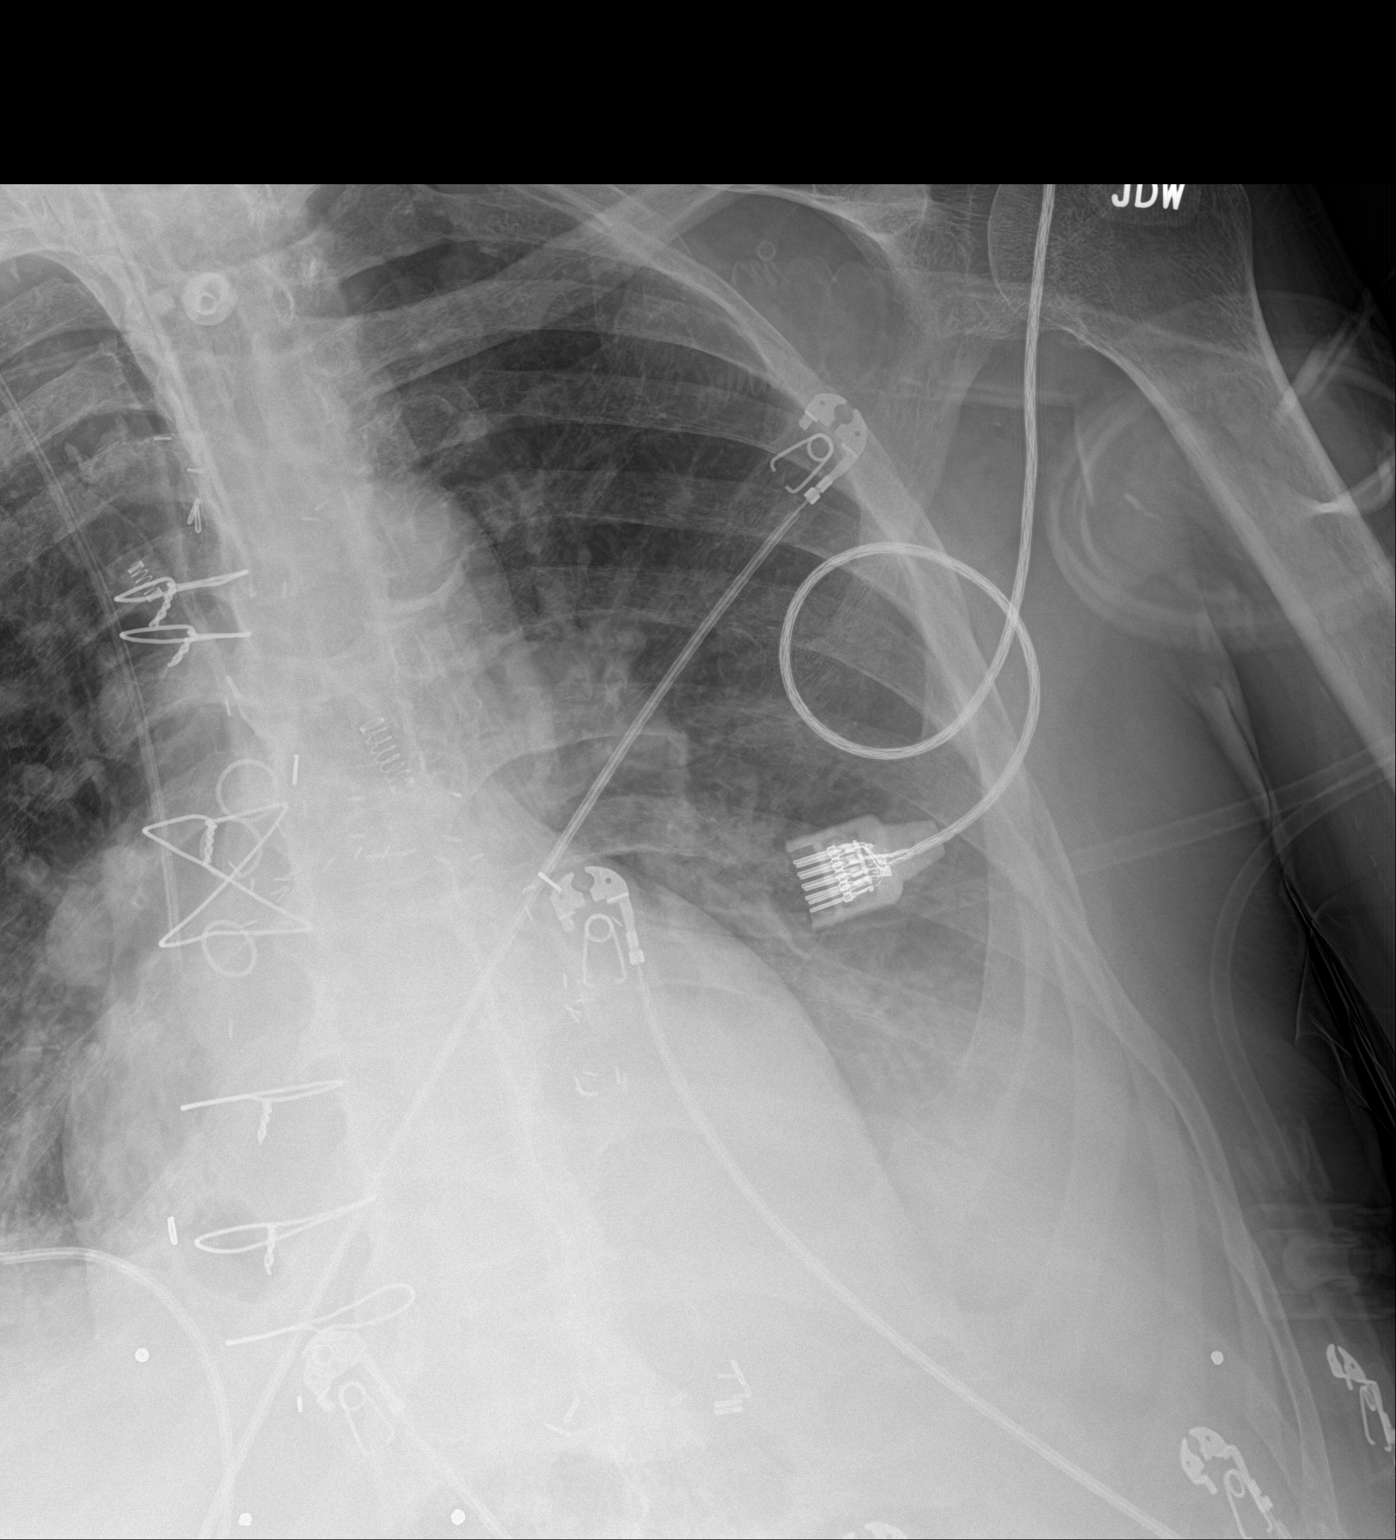

[chest ap (2 of 2)]
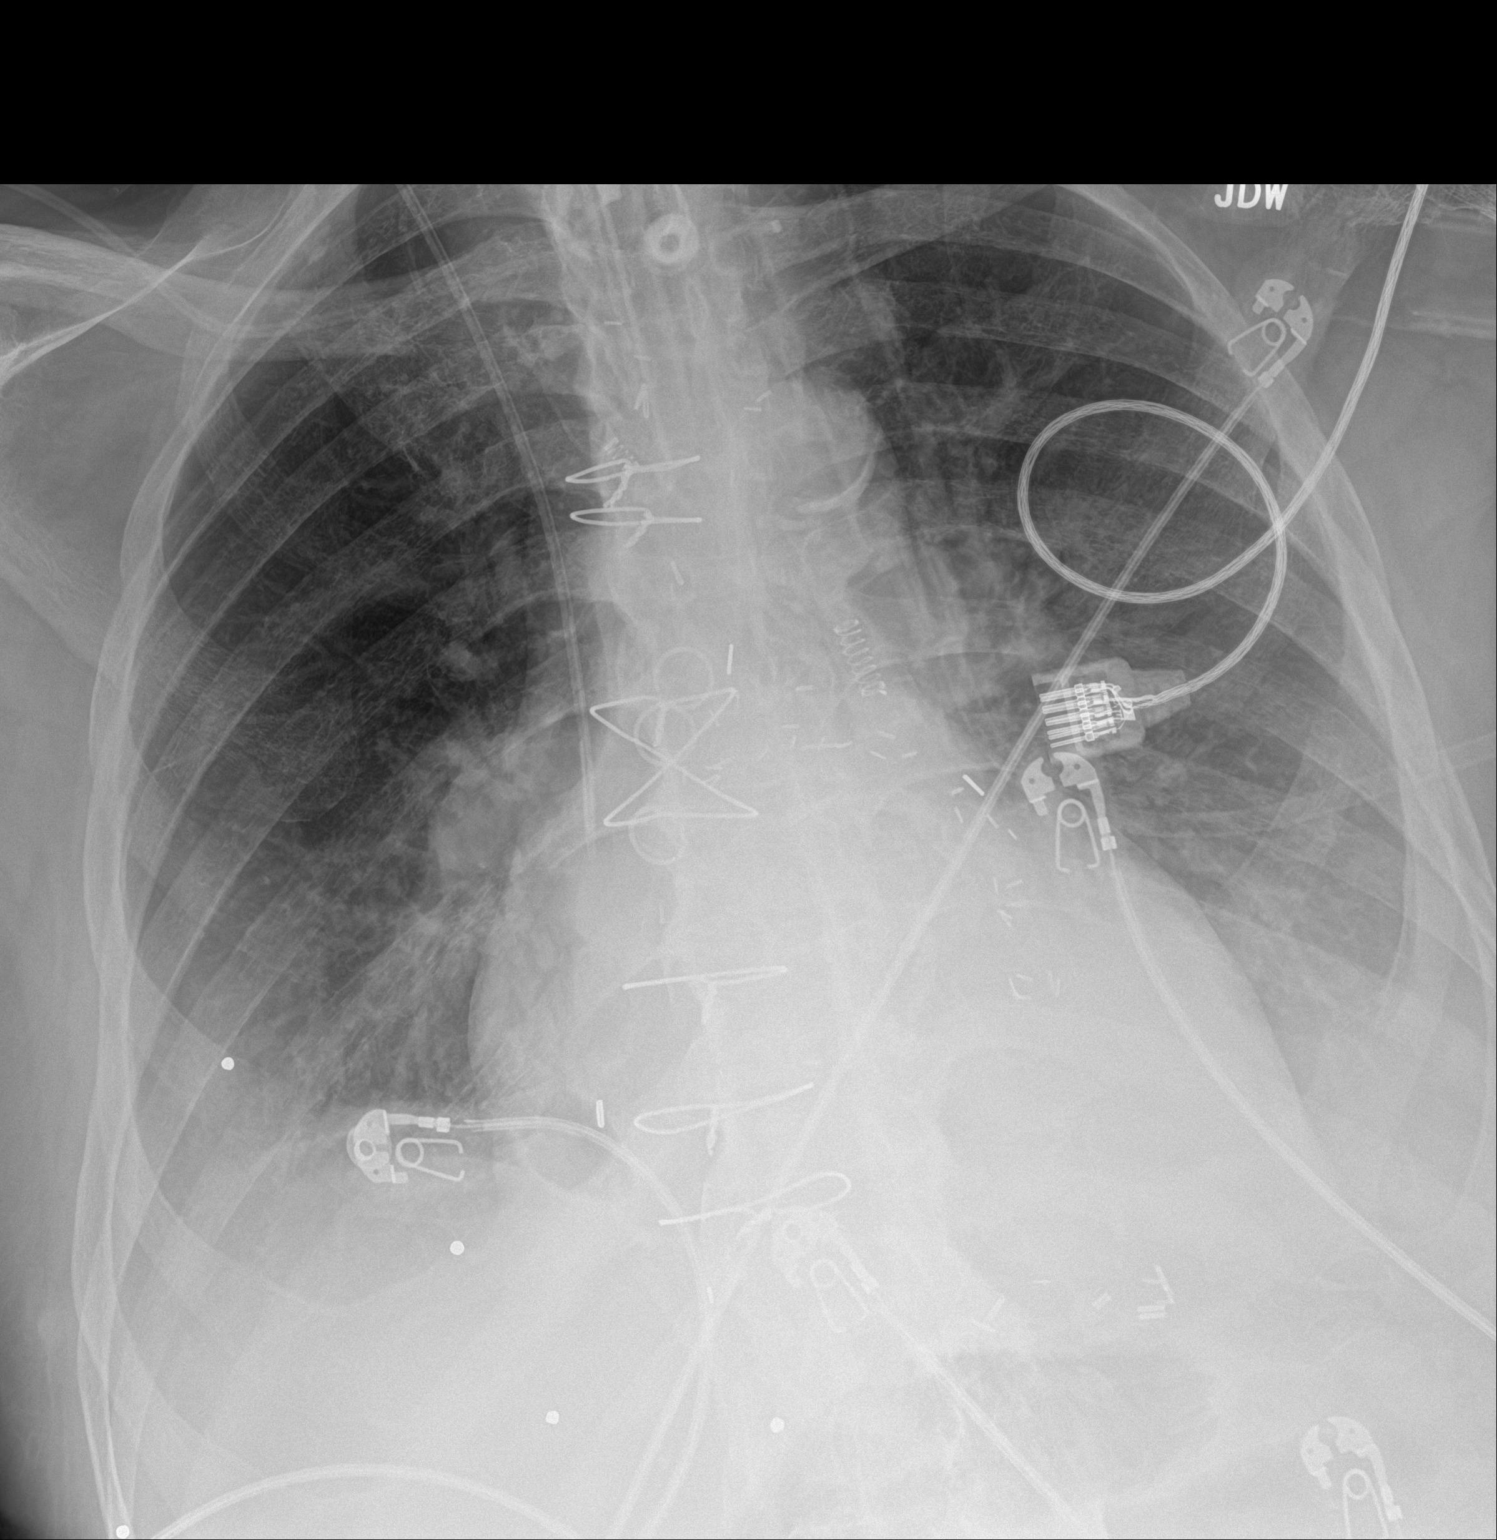

[2 of 2 positions shown; findings below may reference images not displayed]

FINDINGS: Cardiac shadow is mildly enlarged but stable. Postsurgical changes
are again seen. Endotracheal tube is noted 3.4 cm above the carina.
Right jugular central line is noted at the cavoatrial junction. No
pneumothorax is seen. Small pleural effusions are noted bilaterally.
No acute bony abnormality is seen.
IMPRESSION: Endotracheal tube and right jugular central line in satisfactory
position. The remainder of the exam is stable from the previous
study.
